# Patient Record
Sex: Male | Born: 1935 | Race: White | Hispanic: No | Marital: Married | Smoking: Never smoker
Health system: Southern US, Community
[De-identification: ages and names within clinical notes are randomized; demographics above are authoritative.]

## PROBLEM LIST (undated history)

## (undated) DIAGNOSIS — F329 Major depressive disorder, single episode, unspecified: Secondary | ICD-10-CM

## (undated) DIAGNOSIS — R51 Headache: Secondary | ICD-10-CM

## (undated) DIAGNOSIS — I69359 Hemiplegia and hemiparesis following cerebral infarction affecting unspecified side: Secondary | ICD-10-CM

## (undated) DIAGNOSIS — I69398 Other sequelae of cerebral infarction: Secondary | ICD-10-CM

## (undated) DIAGNOSIS — K589 Irritable bowel syndrome without diarrhea: Secondary | ICD-10-CM

## (undated) DIAGNOSIS — R519 Headache, unspecified: Secondary | ICD-10-CM

## (undated) DIAGNOSIS — I2510 Atherosclerotic heart disease of native coronary artery without angina pectoris: Secondary | ICD-10-CM

## (undated) DIAGNOSIS — F4310 Post-traumatic stress disorder, unspecified: Secondary | ICD-10-CM

## (undated) DIAGNOSIS — N433 Hydrocele, unspecified: Secondary | ICD-10-CM

## (undated) DIAGNOSIS — M545 Low back pain, unspecified: Secondary | ICD-10-CM

## (undated) DIAGNOSIS — N400 Enlarged prostate without lower urinary tract symptoms: Secondary | ICD-10-CM

## (undated) DIAGNOSIS — K449 Diaphragmatic hernia without obstruction or gangrene: Secondary | ICD-10-CM

## (undated) DIAGNOSIS — R350 Frequency of micturition: Secondary | ICD-10-CM

## (undated) DIAGNOSIS — R269 Unspecified abnormalities of gait and mobility: Secondary | ICD-10-CM

## (undated) DIAGNOSIS — F32A Depression, unspecified: Secondary | ICD-10-CM

## (undated) DIAGNOSIS — H9190 Unspecified hearing loss, unspecified ear: Secondary | ICD-10-CM

## (undated) DIAGNOSIS — I219 Acute myocardial infarction, unspecified: Secondary | ICD-10-CM

## (undated) DIAGNOSIS — R609 Edema, unspecified: Secondary | ICD-10-CM

## (undated) DIAGNOSIS — M109 Gout, unspecified: Secondary | ICD-10-CM

## (undated) DIAGNOSIS — S065X9A Traumatic subdural hemorrhage with loss of consciousness of unspecified duration, initial encounter: Secondary | ICD-10-CM

## (undated) DIAGNOSIS — H5440 Blindness, one eye, unspecified eye: Secondary | ICD-10-CM

## (undated) DIAGNOSIS — F419 Anxiety disorder, unspecified: Secondary | ICD-10-CM

## (undated) DIAGNOSIS — E785 Hyperlipidemia, unspecified: Secondary | ICD-10-CM

## (undated) DIAGNOSIS — I82419 Acute embolism and thrombosis of unspecified femoral vein: Secondary | ICD-10-CM

## (undated) DIAGNOSIS — K2970 Gastritis, unspecified, without bleeding: Secondary | ICD-10-CM

## (undated) DIAGNOSIS — I10 Essential (primary) hypertension: Secondary | ICD-10-CM

## (undated) DIAGNOSIS — G629 Polyneuropathy, unspecified: Secondary | ICD-10-CM

## (undated) DIAGNOSIS — G8929 Other chronic pain: Secondary | ICD-10-CM

## (undated) DIAGNOSIS — Z9289 Personal history of other medical treatment: Secondary | ICD-10-CM

## (undated) DIAGNOSIS — S065XAA Traumatic subdural hemorrhage with loss of consciousness status unknown, initial encounter: Secondary | ICD-10-CM

## (undated) DIAGNOSIS — L0390 Cellulitis, unspecified: Secondary | ICD-10-CM

## (undated) DIAGNOSIS — K219 Gastro-esophageal reflux disease without esophagitis: Secondary | ICD-10-CM

## (undated) DIAGNOSIS — G459 Transient cerebral ischemic attack, unspecified: Secondary | ICD-10-CM

## (undated) DIAGNOSIS — Z8619 Personal history of other infectious and parasitic diseases: Secondary | ICD-10-CM

## (undated) DIAGNOSIS — I739 Peripheral vascular disease, unspecified: Secondary | ICD-10-CM

## (undated) DIAGNOSIS — I639 Cerebral infarction, unspecified: Secondary | ICD-10-CM

## (undated) DIAGNOSIS — I2699 Other pulmonary embolism without acute cor pulmonale: Secondary | ICD-10-CM

## (undated) DIAGNOSIS — K222 Esophageal obstruction: Secondary | ICD-10-CM

## (undated) DIAGNOSIS — R42 Dizziness and giddiness: Secondary | ICD-10-CM

## (undated) DIAGNOSIS — R296 Repeated falls: Secondary | ICD-10-CM

## (undated) DIAGNOSIS — M1990 Unspecified osteoarthritis, unspecified site: Secondary | ICD-10-CM

## (undated) DIAGNOSIS — I499 Cardiac arrhythmia, unspecified: Secondary | ICD-10-CM

## (undated) HISTORY — DX: Low back pain, unspecified: M54.50

## (undated) HISTORY — DX: Irritable bowel syndrome, unspecified: K58.9

## (undated) HISTORY — DX: Acute embolism and thrombosis of unspecified femoral vein: I82.419

## (undated) HISTORY — DX: Cerebral infarction, unspecified: I63.9

## (undated) HISTORY — DX: Edema, unspecified: R60.9

## (undated) HISTORY — PX: CORONARY ANGIOPLASTY WITH STENT PLACEMENT: SHX49

## (undated) HISTORY — DX: Other pulmonary embolism without acute cor pulmonale: I26.99

## (undated) HISTORY — DX: Personal history of other infectious and parasitic diseases: Z86.19

## (undated) HISTORY — PX: ESOPHAGOGASTRODUODENOSCOPY (EGD) WITH ESOPHAGEAL DILATION: SHX5812

## (undated) HISTORY — DX: Polyneuropathy, unspecified: G62.9

## (undated) HISTORY — DX: Diaphragmatic hernia without obstruction or gangrene: K44.9

## (undated) HISTORY — DX: Traumatic subdural hemorrhage with loss of consciousness status unknown, initial encounter: S06.5XAA

## (undated) HISTORY — DX: Other chronic pain: G89.29

## (undated) HISTORY — PX: TONSILLECTOMY: SUR1361

## (undated) HISTORY — DX: Hyperlipidemia, unspecified: E78.5

## (undated) HISTORY — DX: Repeated falls: R29.6

## (undated) HISTORY — DX: Hydrocele, unspecified: N43.3

## (undated) HISTORY — PX: HAND CONTRACTURE RELEASE: SHX1724

## (undated) HISTORY — DX: Gastro-esophageal reflux disease without esophagitis: K21.9

## (undated) HISTORY — PX: CATARACT EXTRACTION, BILATERAL: SHX1313

## (undated) HISTORY — PX: CAROTID ENDARTERECTOMY: SUR193

## (undated) HISTORY — DX: Peripheral vascular disease, unspecified: I73.9

## (undated) HISTORY — DX: Gastritis, unspecified, without bleeding: K29.70

## (undated) HISTORY — DX: Traumatic subdural hemorrhage with loss of consciousness of unspecified duration, initial encounter: S06.5X9A

## (undated) HISTORY — DX: Low back pain: M54.5

## (undated) HISTORY — DX: Esophageal obstruction: K22.2

## (undated) HISTORY — DX: Benign prostatic hyperplasia without lower urinary tract symptoms: N40.0

## (undated) HISTORY — DX: Atherosclerotic heart disease of native coronary artery without angina pectoris: I25.10

## (undated) HISTORY — DX: Cellulitis, unspecified: L03.90

## (undated) HISTORY — PX: HAND SURGERY: SHX662

## (undated) HISTORY — DX: Unspecified abnormalities of gait and mobility: R26.9

## (undated) HISTORY — DX: Gout, unspecified: M10.9

## (undated) HISTORY — DX: Personal history of other medical treatment: Z92.89

## (undated) HISTORY — DX: Hemiplegia and hemiparesis following cerebral infarction affecting unspecified side: I69.359

## (undated) HISTORY — PX: ANGIOPLASTY: SHX39

## (undated) HISTORY — DX: Other sequelae of cerebral infarction: I69.398

## (undated) HISTORY — DX: Anxiety disorder, unspecified: F41.9

## (undated) HISTORY — DX: Dizziness and giddiness: R42

## (undated) HISTORY — DX: Unspecified hearing loss, unspecified ear: H91.90

---

## 1976-03-13 HISTORY — PX: TYMPANIC MEMBRANE REPAIR: SHX294

## 1998-06-20 ENCOUNTER — Encounter: Payer: Self-pay | Admitting: *Deleted

## 1998-06-20 ENCOUNTER — Inpatient Hospital Stay (HOSPITAL_COMMUNITY): Admission: EM | Admit: 1998-06-20 | Discharge: 1998-06-21 | Payer: Self-pay | Admitting: *Deleted

## 1998-06-21 ENCOUNTER — Encounter: Payer: Self-pay | Admitting: Cardiology

## 1998-06-24 ENCOUNTER — Ambulatory Visit (HOSPITAL_COMMUNITY): Admission: RE | Admit: 1998-06-24 | Discharge: 1998-06-24 | Payer: Self-pay | Admitting: Gastroenterology

## 1998-06-24 ENCOUNTER — Encounter: Payer: Self-pay | Admitting: Gastroenterology

## 1998-07-28 ENCOUNTER — Ambulatory Visit (HOSPITAL_COMMUNITY): Admission: RE | Admit: 1998-07-28 | Discharge: 1998-07-28 | Payer: Self-pay | Admitting: Gastroenterology

## 1998-09-09 ENCOUNTER — Emergency Department (HOSPITAL_COMMUNITY): Admission: EM | Admit: 1998-09-09 | Discharge: 1998-09-09 | Payer: Self-pay | Admitting: Emergency Medicine

## 1998-11-19 ENCOUNTER — Inpatient Hospital Stay (HOSPITAL_COMMUNITY): Admission: EM | Admit: 1998-11-19 | Discharge: 1998-11-21 | Payer: Self-pay | Admitting: *Deleted

## 1998-11-20 ENCOUNTER — Encounter: Payer: Self-pay | Admitting: Internal Medicine

## 1998-12-14 ENCOUNTER — Encounter: Payer: Self-pay | Admitting: Internal Medicine

## 1998-12-14 ENCOUNTER — Ambulatory Visit (HOSPITAL_COMMUNITY): Admission: RE | Admit: 1998-12-14 | Discharge: 1998-12-14 | Payer: Self-pay | Admitting: Gastroenterology

## 2001-12-21 ENCOUNTER — Emergency Department (HOSPITAL_COMMUNITY): Admission: EM | Admit: 2001-12-21 | Discharge: 2001-12-21 | Payer: Self-pay | Admitting: Emergency Medicine

## 2001-12-21 ENCOUNTER — Encounter: Payer: Self-pay | Admitting: Family Medicine

## 2002-09-08 ENCOUNTER — Inpatient Hospital Stay (HOSPITAL_COMMUNITY): Admission: AD | Admit: 2002-09-08 | Discharge: 2002-09-09 | Payer: Self-pay | Admitting: Internal Medicine

## 2002-09-08 ENCOUNTER — Encounter: Payer: Self-pay | Admitting: Internal Medicine

## 2002-10-28 ENCOUNTER — Emergency Department (HOSPITAL_COMMUNITY): Admission: EM | Admit: 2002-10-28 | Discharge: 2002-10-29 | Payer: Self-pay | Admitting: Emergency Medicine

## 2002-10-28 ENCOUNTER — Encounter: Payer: Self-pay | Admitting: Emergency Medicine

## 2002-12-10 ENCOUNTER — Ambulatory Visit (HOSPITAL_COMMUNITY): Admission: RE | Admit: 2002-12-10 | Discharge: 2002-12-10 | Payer: Self-pay | Admitting: Internal Medicine

## 2002-12-10 ENCOUNTER — Encounter (INDEPENDENT_AMBULATORY_CARE_PROVIDER_SITE_OTHER): Payer: Self-pay | Admitting: Specialist

## 2004-03-25 ENCOUNTER — Ambulatory Visit: Payer: Self-pay | Admitting: Internal Medicine

## 2004-03-26 ENCOUNTER — Emergency Department (HOSPITAL_COMMUNITY): Admission: EM | Admit: 2004-03-26 | Discharge: 2004-03-26 | Payer: Self-pay | Admitting: Emergency Medicine

## 2004-04-22 ENCOUNTER — Ambulatory Visit: Payer: Self-pay | Admitting: Internal Medicine

## 2004-05-16 ENCOUNTER — Ambulatory Visit: Payer: Self-pay | Admitting: Internal Medicine

## 2004-05-24 ENCOUNTER — Ambulatory Visit: Payer: Self-pay | Admitting: Internal Medicine

## 2004-06-05 ENCOUNTER — Ambulatory Visit: Payer: Self-pay | Admitting: Internal Medicine

## 2004-06-05 ENCOUNTER — Inpatient Hospital Stay (HOSPITAL_COMMUNITY): Admission: EM | Admit: 2004-06-05 | Discharge: 2004-06-07 | Payer: Self-pay | Admitting: Emergency Medicine

## 2004-06-06 ENCOUNTER — Ambulatory Visit: Payer: Self-pay | Admitting: Internal Medicine

## 2004-06-06 ENCOUNTER — Encounter: Payer: Self-pay | Admitting: Internal Medicine

## 2004-06-21 ENCOUNTER — Ambulatory Visit: Payer: Self-pay | Admitting: Internal Medicine

## 2004-07-11 ENCOUNTER — Ambulatory Visit: Payer: Self-pay | Admitting: Internal Medicine

## 2004-08-13 ENCOUNTER — Ambulatory Visit: Payer: Self-pay | Admitting: Family Medicine

## 2004-08-19 ENCOUNTER — Ambulatory Visit: Payer: Self-pay | Admitting: Internal Medicine

## 2004-12-19 ENCOUNTER — Encounter: Admission: RE | Admit: 2004-12-19 | Discharge: 2005-03-19 | Payer: Self-pay | Admitting: Neurology

## 2005-01-02 ENCOUNTER — Encounter: Admission: RE | Admit: 2005-01-02 | Discharge: 2005-01-02 | Payer: Self-pay | Admitting: Neurology

## 2005-01-16 ENCOUNTER — Ambulatory Visit: Payer: Self-pay

## 2005-03-30 ENCOUNTER — Emergency Department (HOSPITAL_COMMUNITY): Admission: EM | Admit: 2005-03-30 | Discharge: 2005-03-30 | Payer: Self-pay | Admitting: Emergency Medicine

## 2005-06-23 ENCOUNTER — Ambulatory Visit: Payer: Self-pay | Admitting: Endocrinology

## 2005-07-14 ENCOUNTER — Encounter (INDEPENDENT_AMBULATORY_CARE_PROVIDER_SITE_OTHER): Payer: Self-pay | Admitting: *Deleted

## 2005-07-14 ENCOUNTER — Ambulatory Visit: Payer: Self-pay | Admitting: Internal Medicine

## 2005-07-14 ENCOUNTER — Inpatient Hospital Stay (HOSPITAL_COMMUNITY): Admission: AD | Admit: 2005-07-14 | Discharge: 2005-07-24 | Payer: Self-pay | Admitting: Internal Medicine

## 2005-07-21 ENCOUNTER — Ambulatory Visit: Payer: Self-pay | Admitting: Physical Medicine & Rehabilitation

## 2005-07-27 ENCOUNTER — Ambulatory Visit: Payer: Self-pay | Admitting: Internal Medicine

## 2005-07-31 ENCOUNTER — Ambulatory Visit: Payer: Self-pay | Admitting: Internal Medicine

## 2005-08-05 ENCOUNTER — Emergency Department (HOSPITAL_COMMUNITY): Admission: EM | Admit: 2005-08-05 | Discharge: 2005-08-05 | Payer: Self-pay | Admitting: Emergency Medicine

## 2005-08-08 ENCOUNTER — Ambulatory Visit: Payer: Self-pay | Admitting: Internal Medicine

## 2005-08-14 ENCOUNTER — Ambulatory Visit: Payer: Self-pay | Admitting: Cardiology

## 2005-08-24 ENCOUNTER — Ambulatory Visit: Payer: Self-pay | Admitting: Cardiology

## 2005-09-04 ENCOUNTER — Ambulatory Visit: Payer: Self-pay | Admitting: Cardiology

## 2005-09-18 ENCOUNTER — Ambulatory Visit: Payer: Self-pay | Admitting: Internal Medicine

## 2005-10-09 ENCOUNTER — Ambulatory Visit: Payer: Self-pay | Admitting: Cardiovascular Disease

## 2005-10-25 ENCOUNTER — Ambulatory Visit: Payer: Self-pay | Admitting: *Deleted

## 2005-10-30 ENCOUNTER — Ambulatory Visit: Payer: Self-pay | Admitting: Internal Medicine

## 2005-11-22 ENCOUNTER — Ambulatory Visit: Payer: Self-pay | Admitting: Cardiovascular Disease

## 2005-12-18 ENCOUNTER — Ambulatory Visit: Payer: Self-pay | Admitting: Endocrinology

## 2005-12-20 ENCOUNTER — Ambulatory Visit: Payer: Self-pay | Admitting: Cardiology

## 2005-12-28 ENCOUNTER — Ambulatory Visit: Payer: Self-pay | Admitting: Internal Medicine

## 2006-01-01 ENCOUNTER — Ambulatory Visit: Payer: Self-pay | Admitting: Internal Medicine

## 2006-01-01 LAB — CONVERTED CEMR LAB
BUN: 15 mg/dL (ref 6–23)
Basophils Absolute: 0.1 10*3/uL (ref 0.0–0.1)
CO2: 29 meq/L (ref 19–32)
Chloride: 111 meq/L (ref 96–112)
HCT: 44.6 % (ref 39.0–52.0)
MCHC: 33.4 g/dL (ref 30.0–36.0)
MCV: 101.2 fL — ABNORMAL HIGH (ref 78.0–100.0)
Monocytes Relative: 9.2 % (ref 3.0–11.0)
Platelets: 200 10*3/uL (ref 150–400)
Potassium: 5.1 meq/L (ref 3.5–5.1)
RBC: 4.4 M/uL (ref 4.22–5.81)
RDW: 12.9 % (ref 11.5–14.6)
TSH: 2.14 microintl units/mL (ref 0.35–5.50)

## 2006-01-03 ENCOUNTER — Ambulatory Visit: Payer: Self-pay | Admitting: Internal Medicine

## 2006-01-04 ENCOUNTER — Ambulatory Visit: Payer: Self-pay | Admitting: Internal Medicine

## 2006-01-04 ENCOUNTER — Observation Stay (HOSPITAL_COMMUNITY): Admission: EM | Admit: 2006-01-04 | Discharge: 2006-01-04 | Payer: Self-pay | Admitting: Emergency Medicine

## 2006-01-05 ENCOUNTER — Ambulatory Visit: Payer: Self-pay | Admitting: Internal Medicine

## 2006-01-07 ENCOUNTER — Inpatient Hospital Stay (HOSPITAL_COMMUNITY): Admission: EM | Admit: 2006-01-07 | Discharge: 2006-01-10 | Payer: Self-pay | Admitting: Emergency Medicine

## 2006-01-07 ENCOUNTER — Ambulatory Visit: Payer: Self-pay | Admitting: Cardiology

## 2006-01-15 ENCOUNTER — Ambulatory Visit: Payer: Self-pay | Admitting: Cardiology

## 2006-01-17 ENCOUNTER — Ambulatory Visit: Payer: Self-pay | Admitting: Cardiology

## 2006-01-22 ENCOUNTER — Ambulatory Visit: Payer: Self-pay | Admitting: Cardiovascular Disease

## 2006-01-23 ENCOUNTER — Ambulatory Visit: Payer: Self-pay | Admitting: Internal Medicine

## 2006-01-26 ENCOUNTER — Ambulatory Visit: Payer: Self-pay | Admitting: Cardiovascular Disease

## 2006-01-28 ENCOUNTER — Inpatient Hospital Stay (HOSPITAL_COMMUNITY): Admission: EM | Admit: 2006-01-28 | Discharge: 2006-01-29 | Payer: Self-pay | Admitting: Emergency Medicine

## 2006-01-30 ENCOUNTER — Ambulatory Visit: Payer: Self-pay | Admitting: Internal Medicine

## 2006-02-16 ENCOUNTER — Ambulatory Visit: Payer: Self-pay | Admitting: Cardiology

## 2006-02-19 ENCOUNTER — Ambulatory Visit: Payer: Self-pay

## 2006-02-21 ENCOUNTER — Ambulatory Visit: Payer: Self-pay | Admitting: Internal Medicine

## 2006-02-21 ENCOUNTER — Ambulatory Visit: Payer: Self-pay | Admitting: Cardiovascular Disease

## 2006-02-22 ENCOUNTER — Ambulatory Visit: Payer: Self-pay | Admitting: Internal Medicine

## 2006-02-22 ENCOUNTER — Encounter (INDEPENDENT_AMBULATORY_CARE_PROVIDER_SITE_OTHER): Payer: Self-pay | Admitting: *Deleted

## 2006-02-22 DIAGNOSIS — K2990 Gastroduodenitis, unspecified, without bleeding: Secondary | ICD-10-CM

## 2006-02-22 DIAGNOSIS — K222 Esophageal obstruction: Secondary | ICD-10-CM | POA: Insufficient documentation

## 2006-02-22 DIAGNOSIS — K2980 Duodenitis without bleeding: Secondary | ICD-10-CM | POA: Insufficient documentation

## 2006-02-22 DIAGNOSIS — K2970 Gastritis, unspecified, without bleeding: Secondary | ICD-10-CM | POA: Insufficient documentation

## 2006-02-26 ENCOUNTER — Ambulatory Visit: Payer: Self-pay | Admitting: Gastroenterology

## 2006-02-26 ENCOUNTER — Ambulatory Visit: Payer: Self-pay | Admitting: Cardiovascular Disease

## 2006-02-26 LAB — CONVERTED CEMR LAB
ALT: 25 units/L (ref 0–40)
Alkaline Phosphatase: 65 units/L (ref 39–117)
Bilirubin, Direct: 0.2 mg/dL (ref 0.0–0.3)
Chol/HDL Ratio, serum: 5.6
Total Protein: 6.8 g/dL (ref 6.0–8.3)
Triglyceride fasting, serum: 118 mg/dL (ref 0–149)
VLDL: 24 mg/dL (ref 0–40)

## 2006-03-16 ENCOUNTER — Ambulatory Visit: Payer: Self-pay | Admitting: Cardiology

## 2006-04-03 ENCOUNTER — Inpatient Hospital Stay (HOSPITAL_COMMUNITY): Admission: EM | Admit: 2006-04-03 | Discharge: 2006-04-05 | Payer: Self-pay | Admitting: Emergency Medicine

## 2006-04-03 ENCOUNTER — Ambulatory Visit: Payer: Self-pay | Admitting: Internal Medicine

## 2006-04-09 ENCOUNTER — Ambulatory Visit: Payer: Self-pay | Admitting: Internal Medicine

## 2006-04-13 ENCOUNTER — Ambulatory Visit: Payer: Self-pay | Admitting: Internal Medicine

## 2006-04-18 ENCOUNTER — Ambulatory Visit: Payer: Self-pay | Admitting: Internal Medicine

## 2006-04-21 ENCOUNTER — Emergency Department (HOSPITAL_COMMUNITY): Admission: EM | Admit: 2006-04-21 | Discharge: 2006-04-22 | Payer: Self-pay | Admitting: Emergency Medicine

## 2006-04-25 ENCOUNTER — Ambulatory Visit: Payer: Self-pay | Admitting: Internal Medicine

## 2006-05-11 ENCOUNTER — Ambulatory Visit: Payer: Self-pay | Admitting: Internal Medicine

## 2006-06-05 ENCOUNTER — Emergency Department (HOSPITAL_COMMUNITY): Admission: EM | Admit: 2006-06-05 | Discharge: 2006-06-06 | Payer: Self-pay | Admitting: Emergency Medicine

## 2006-06-14 ENCOUNTER — Ambulatory Visit: Payer: Self-pay | Admitting: Cardiology

## 2006-06-25 ENCOUNTER — Ambulatory Visit: Payer: Self-pay | Admitting: Internal Medicine

## 2006-06-28 ENCOUNTER — Ambulatory Visit: Payer: Self-pay | Admitting: Internal Medicine

## 2006-07-12 ENCOUNTER — Ambulatory Visit: Payer: Self-pay | Admitting: Cardiovascular Disease

## 2006-08-02 ENCOUNTER — Ambulatory Visit: Payer: Self-pay | Admitting: Cardiology

## 2006-08-16 ENCOUNTER — Ambulatory Visit: Payer: Self-pay | Admitting: Cardiovascular Disease

## 2006-08-21 ENCOUNTER — Ambulatory Visit: Payer: Self-pay | Admitting: Internal Medicine

## 2006-08-31 ENCOUNTER — Ambulatory Visit: Payer: Self-pay | Admitting: Cardiovascular Disease

## 2006-09-21 ENCOUNTER — Ambulatory Visit: Payer: Self-pay | Admitting: Cardiology

## 2006-10-03 DIAGNOSIS — Z8679 Personal history of other diseases of the circulatory system: Secondary | ICD-10-CM | POA: Insufficient documentation

## 2006-10-03 DIAGNOSIS — E785 Hyperlipidemia, unspecified: Secondary | ICD-10-CM

## 2006-10-19 ENCOUNTER — Ambulatory Visit: Payer: Self-pay | Admitting: Cardiology

## 2006-10-22 ENCOUNTER — Encounter
Admission: RE | Admit: 2006-10-22 | Discharge: 2006-10-22 | Payer: Self-pay | Admitting: Physical Medicine and Rehabilitation

## 2006-11-06 ENCOUNTER — Ambulatory Visit: Payer: Self-pay | Admitting: Cardiology

## 2006-11-14 ENCOUNTER — Ambulatory Visit: Payer: Self-pay | Admitting: Internal Medicine

## 2006-11-23 ENCOUNTER — Emergency Department (HOSPITAL_COMMUNITY): Admission: EM | Admit: 2006-11-23 | Discharge: 2006-11-24 | Payer: Self-pay | Admitting: Emergency Medicine

## 2006-11-27 ENCOUNTER — Encounter: Admission: RE | Admit: 2006-11-27 | Discharge: 2006-12-11 | Payer: Self-pay | Admitting: Neurology

## 2006-11-28 ENCOUNTER — Ambulatory Visit: Payer: Self-pay | Admitting: Internal Medicine

## 2006-12-19 ENCOUNTER — Ambulatory Visit: Payer: Self-pay | Admitting: Internal Medicine

## 2006-12-26 ENCOUNTER — Ambulatory Visit: Payer: Self-pay | Admitting: Cardiovascular Disease

## 2007-01-03 ENCOUNTER — Ambulatory Visit: Payer: Self-pay | Admitting: Cardiovascular Disease

## 2007-01-09 ENCOUNTER — Ambulatory Visit: Payer: Self-pay | Admitting: Cardiology

## 2007-01-18 ENCOUNTER — Ambulatory Visit: Payer: Self-pay | Admitting: Cardiovascular Disease

## 2007-01-18 ENCOUNTER — Ambulatory Visit: Payer: Self-pay | Admitting: Internal Medicine

## 2007-01-18 DIAGNOSIS — Z86718 Personal history of other venous thrombosis and embolism: Secondary | ICD-10-CM | POA: Insufficient documentation

## 2007-01-18 DIAGNOSIS — K219 Gastro-esophageal reflux disease without esophagitis: Secondary | ICD-10-CM | POA: Insufficient documentation

## 2007-01-18 DIAGNOSIS — I739 Peripheral vascular disease, unspecified: Secondary | ICD-10-CM | POA: Insufficient documentation

## 2007-01-18 LAB — CONVERTED CEMR LAB
AST: 21 units/L (ref 0–37)
Albumin: 3.4 g/dL — ABNORMAL LOW (ref 3.5–5.2)
Bilirubin, Direct: 0.2 mg/dL (ref 0.0–0.3)
Cholesterol: 211 mg/dL (ref 0–200)
Direct LDL: 155.4 mg/dL
HDL: 37 mg/dL — ABNORMAL LOW (ref >39.0)
Total Bilirubin: 1.1 mg/dL (ref 0.3–1.2)
Triglycerides: 81 mg/dL (ref 0–149)
VLDL: 16 mg/dL (ref 0–40)

## 2007-01-24 ENCOUNTER — Ambulatory Visit: Payer: Self-pay | Admitting: Cardiology

## 2007-02-14 ENCOUNTER — Ambulatory Visit: Payer: Self-pay | Admitting: Cardiovascular Disease

## 2007-03-12 ENCOUNTER — Ambulatory Visit: Payer: Self-pay | Admitting: Internal Medicine

## 2007-03-14 ENCOUNTER — Ambulatory Visit: Payer: Self-pay | Admitting: Internal Medicine

## 2007-03-14 DIAGNOSIS — I82419 Acute embolism and thrombosis of unspecified femoral vein: Secondary | ICD-10-CM

## 2007-03-14 HISTORY — DX: Acute embolism and thrombosis of unspecified femoral vein: I82.419

## 2007-03-15 ENCOUNTER — Inpatient Hospital Stay (HOSPITAL_COMMUNITY): Admission: EM | Admit: 2007-03-15 | Discharge: 2007-03-17 | Payer: Self-pay | Admitting: Emergency Medicine

## 2007-03-16 ENCOUNTER — Ambulatory Visit: Payer: Self-pay | Admitting: Surgery

## 2007-03-16 ENCOUNTER — Encounter: Payer: Self-pay | Admitting: Internal Medicine

## 2007-03-21 ENCOUNTER — Telehealth: Payer: Self-pay | Admitting: Internal Medicine

## 2007-03-25 ENCOUNTER — Ambulatory Visit: Payer: Self-pay | Admitting: Internal Medicine

## 2007-03-25 DIAGNOSIS — L03119 Cellulitis of unspecified part of limb: Secondary | ICD-10-CM

## 2007-03-25 DIAGNOSIS — R609 Edema, unspecified: Secondary | ICD-10-CM

## 2007-03-25 DIAGNOSIS — L02419 Cutaneous abscess of limb, unspecified: Secondary | ICD-10-CM | POA: Insufficient documentation

## 2007-04-04 ENCOUNTER — Ambulatory Visit: Payer: Self-pay | Admitting: Cardiology

## 2007-04-14 ENCOUNTER — Encounter: Payer: Self-pay | Admitting: Internal Medicine

## 2007-04-30 ENCOUNTER — Ambulatory Visit: Payer: Self-pay | Admitting: Cardiology

## 2007-05-17 ENCOUNTER — Ambulatory Visit: Payer: Self-pay | Admitting: Cardiology

## 2007-05-27 DIAGNOSIS — F419 Anxiety disorder, unspecified: Secondary | ICD-10-CM | POA: Insufficient documentation

## 2007-05-28 ENCOUNTER — Ambulatory Visit: Payer: Self-pay | Admitting: Internal Medicine

## 2007-05-28 LAB — CONVERTED CEMR LAB
ALT: 17 units/L (ref 0–53)
AST: 20 units/L (ref 0–37)
Alkaline Phosphatase: 59 units/L (ref 39–117)
BUN: 12 mg/dL (ref 6–23)
Basophils Relative: 1.4 % — ABNORMAL HIGH (ref 0.0–1.0)
Bilirubin, Direct: 0.1 mg/dL (ref 0.0–0.3)
CO2: 29 meq/L (ref 19–32)
Calcium: 8.8 mg/dL (ref 8.4–10.5)
Chloride: 106 meq/L (ref 96–112)
Creatinine, Ser: 1.3 mg/dL (ref 0.4–1.5)
Eosinophils Relative: 2 % (ref 0.0–5.0)
Glucose, Bld: 90 mg/dL (ref 70–99)
Lymphocytes Relative: 45.3 % (ref 12.0–46.0)
Monocytes Relative: 9 % (ref 3.0–11.0)
Platelets: 184 10*3/uL (ref 150–400)
RDW: 13.4 % (ref 11.5–14.6)
Total Bilirubin: 0.8 mg/dL (ref 0.3–1.2)
Total Protein: 6.6 g/dL (ref 6.0–8.3)
WBC: 4.8 10*3/uL (ref 4.5–10.5)

## 2007-05-30 ENCOUNTER — Ambulatory Visit (HOSPITAL_COMMUNITY): Admission: RE | Admit: 2007-05-30 | Discharge: 2007-05-30 | Payer: Self-pay | Admitting: Internal Medicine

## 2007-05-31 ENCOUNTER — Ambulatory Visit: Payer: Self-pay | Admitting: Cardiovascular Disease

## 2007-06-24 ENCOUNTER — Ambulatory Visit: Payer: Self-pay | Admitting: Cardiology

## 2007-06-25 ENCOUNTER — Ambulatory Visit: Payer: Self-pay | Admitting: Internal Medicine

## 2007-06-25 DIAGNOSIS — R42 Dizziness and giddiness: Secondary | ICD-10-CM

## 2007-07-25 ENCOUNTER — Ambulatory Visit: Payer: Self-pay | Admitting: Internal Medicine

## 2007-08-02 ENCOUNTER — Telehealth: Payer: Self-pay | Admitting: Internal Medicine

## 2007-08-23 ENCOUNTER — Ambulatory Visit: Payer: Self-pay | Admitting: Cardiology

## 2007-09-06 ENCOUNTER — Ambulatory Visit: Payer: Self-pay | Admitting: Cardiology

## 2007-10-04 ENCOUNTER — Ambulatory Visit: Payer: Self-pay | Admitting: Cardiology

## 2007-10-07 ENCOUNTER — Telehealth: Payer: Self-pay | Admitting: Internal Medicine

## 2007-10-15 ENCOUNTER — Ambulatory Visit: Payer: Self-pay | Admitting: Internal Medicine

## 2007-10-15 DIAGNOSIS — K589 Irritable bowel syndrome without diarrhea: Secondary | ICD-10-CM | POA: Insufficient documentation

## 2007-11-01 ENCOUNTER — Ambulatory Visit: Payer: Self-pay | Admitting: Internal Medicine

## 2007-11-11 ENCOUNTER — Ambulatory Visit: Payer: Self-pay | Admitting: Cardiology

## 2007-12-02 ENCOUNTER — Ambulatory Visit: Payer: Self-pay | Admitting: Cardiovascular Disease

## 2007-12-16 ENCOUNTER — Ambulatory Visit: Payer: Self-pay | Admitting: Cardiology

## 2007-12-19 ENCOUNTER — Encounter (INDEPENDENT_AMBULATORY_CARE_PROVIDER_SITE_OTHER): Payer: Self-pay | Admitting: Emergency Medicine

## 2007-12-19 ENCOUNTER — Emergency Department (HOSPITAL_COMMUNITY): Admission: EM | Admit: 2007-12-19 | Discharge: 2007-12-19 | Payer: Self-pay | Admitting: Emergency Medicine

## 2007-12-19 ENCOUNTER — Ambulatory Visit: Payer: Self-pay | Admitting: Vascular Surgery

## 2008-01-06 ENCOUNTER — Ambulatory Visit: Payer: Self-pay | Admitting: Cardiology

## 2008-01-14 ENCOUNTER — Ambulatory Visit: Payer: Self-pay | Admitting: Cardiovascular Disease

## 2008-01-14 ENCOUNTER — Ambulatory Visit: Payer: Self-pay | Admitting: Internal Medicine

## 2008-01-30 ENCOUNTER — Ambulatory Visit: Payer: Self-pay | Admitting: Cardiology

## 2008-02-23 ENCOUNTER — Emergency Department (HOSPITAL_COMMUNITY): Admission: EM | Admit: 2008-02-23 | Discharge: 2008-02-23 | Payer: Self-pay | Admitting: Emergency Medicine

## 2008-02-25 ENCOUNTER — Encounter: Payer: Self-pay | Admitting: Internal Medicine

## 2008-02-27 ENCOUNTER — Ambulatory Visit: Payer: Self-pay | Admitting: Cardiology

## 2008-03-19 ENCOUNTER — Ambulatory Visit: Payer: Self-pay | Admitting: Cardiovascular Disease

## 2008-04-09 ENCOUNTER — Ambulatory Visit: Payer: Self-pay | Admitting: Internal Medicine

## 2008-04-09 ENCOUNTER — Telehealth: Payer: Self-pay | Admitting: Internal Medicine

## 2008-04-23 ENCOUNTER — Ambulatory Visit: Payer: Self-pay | Admitting: Cardiovascular Disease

## 2008-04-24 ENCOUNTER — Encounter: Payer: Self-pay | Admitting: Internal Medicine

## 2008-05-08 ENCOUNTER — Encounter: Payer: Self-pay | Admitting: Internal Medicine

## 2008-05-11 ENCOUNTER — Ambulatory Visit: Payer: Self-pay | Admitting: Cardiology

## 2008-05-25 ENCOUNTER — Ambulatory Visit: Payer: Self-pay | Admitting: Cardiology

## 2008-06-22 ENCOUNTER — Ambulatory Visit: Payer: Self-pay | Admitting: Internal Medicine

## 2008-06-24 ENCOUNTER — Telehealth: Payer: Self-pay | Admitting: Cardiovascular Disease

## 2008-07-03 ENCOUNTER — Telehealth: Payer: Self-pay | Admitting: Cardiovascular Disease

## 2008-07-20 ENCOUNTER — Ambulatory Visit: Payer: Self-pay | Admitting: Cardiovascular Disease

## 2008-08-11 ENCOUNTER — Encounter: Payer: Self-pay | Admitting: *Deleted

## 2008-08-17 ENCOUNTER — Ambulatory Visit: Payer: Self-pay | Admitting: Cardiovascular Disease

## 2008-08-17 LAB — CONVERTED CEMR LAB: Protime: 20.2

## 2008-09-15 ENCOUNTER — Ambulatory Visit: Payer: Self-pay | Admitting: Cardiology

## 2008-09-15 LAB — CONVERTED CEMR LAB: Prothrombin Time: 17 s

## 2008-09-16 ENCOUNTER — Encounter: Payer: Self-pay | Admitting: *Deleted

## 2008-10-13 ENCOUNTER — Ambulatory Visit: Payer: Self-pay | Admitting: Cardiology

## 2008-10-13 LAB — CONVERTED CEMR LAB
POC INR: 4
Prothrombin Time: 24.4 s

## 2008-10-27 ENCOUNTER — Ambulatory Visit: Payer: Self-pay | Admitting: Cardiology

## 2008-11-05 ENCOUNTER — Encounter: Payer: Self-pay | Admitting: Internal Medicine

## 2008-11-17 ENCOUNTER — Ambulatory Visit: Payer: Self-pay | Admitting: Cardiology

## 2008-11-17 LAB — CONVERTED CEMR LAB: POC INR: 2.7

## 2008-11-26 ENCOUNTER — Emergency Department (HOSPITAL_COMMUNITY): Admission: EM | Admit: 2008-11-26 | Discharge: 2008-11-26 | Payer: Self-pay | Admitting: Emergency Medicine

## 2008-11-27 ENCOUNTER — Encounter: Payer: Self-pay | Admitting: Internal Medicine

## 2008-12-16 ENCOUNTER — Ambulatory Visit: Payer: Self-pay | Admitting: Internal Medicine

## 2009-01-04 ENCOUNTER — Ambulatory Visit: Payer: Self-pay | Admitting: Cardiovascular Disease

## 2009-01-21 ENCOUNTER — Ambulatory Visit: Payer: Self-pay | Admitting: Internal Medicine

## 2009-02-08 DIAGNOSIS — Z87898 Personal history of other specified conditions: Secondary | ICD-10-CM

## 2009-02-08 DIAGNOSIS — I82409 Acute embolism and thrombosis of unspecified deep veins of unspecified lower extremity: Secondary | ICD-10-CM | POA: Insufficient documentation

## 2009-02-08 DIAGNOSIS — Z8719 Personal history of other diseases of the digestive system: Secondary | ICD-10-CM

## 2009-02-09 ENCOUNTER — Ambulatory Visit: Payer: Self-pay | Admitting: Cardiology

## 2009-02-09 ENCOUNTER — Ambulatory Visit: Payer: Self-pay | Admitting: Cardiovascular Disease

## 2009-02-09 LAB — CONVERTED CEMR LAB: POC INR: 3.4

## 2009-02-15 ENCOUNTER — Emergency Department (HOSPITAL_COMMUNITY): Admission: EM | Admit: 2009-02-15 | Discharge: 2009-02-16 | Payer: Self-pay | Admitting: Emergency Medicine

## 2009-02-18 ENCOUNTER — Encounter: Payer: Self-pay | Admitting: Cardiovascular Disease

## 2009-02-18 ENCOUNTER — Ambulatory Visit: Payer: Self-pay

## 2009-02-23 ENCOUNTER — Telehealth: Payer: Self-pay | Admitting: Cardiovascular Disease

## 2009-02-25 ENCOUNTER — Emergency Department (HOSPITAL_COMMUNITY): Admission: EM | Admit: 2009-02-25 | Discharge: 2009-02-26 | Payer: Self-pay | Admitting: Emergency Medicine

## 2009-03-11 ENCOUNTER — Encounter: Payer: Self-pay | Admitting: Cardiovascular Disease

## 2009-03-11 ENCOUNTER — Ambulatory Visit: Payer: Self-pay

## 2009-03-16 ENCOUNTER — Encounter (INDEPENDENT_AMBULATORY_CARE_PROVIDER_SITE_OTHER): Payer: Self-pay | Admitting: Cardiology

## 2009-03-31 ENCOUNTER — Inpatient Hospital Stay (HOSPITAL_COMMUNITY): Admission: EM | Admit: 2009-03-31 | Discharge: 2009-04-01 | Payer: Self-pay | Admitting: Emergency Medicine

## 2009-04-02 ENCOUNTER — Encounter: Payer: Self-pay | Admitting: Cardiology

## 2009-04-28 ENCOUNTER — Ambulatory Visit (HOSPITAL_COMMUNITY): Admission: RE | Admit: 2009-04-28 | Discharge: 2009-04-28 | Payer: Self-pay | Admitting: Family Medicine

## 2009-04-28 ENCOUNTER — Encounter: Payer: Self-pay | Admitting: Internal Medicine

## 2009-07-22 ENCOUNTER — Encounter: Payer: Self-pay | Admitting: Cardiovascular Disease

## 2009-07-22 ENCOUNTER — Telehealth: Payer: Self-pay | Admitting: Cardiovascular Disease

## 2009-09-06 ENCOUNTER — Encounter: Payer: Self-pay | Admitting: Cardiovascular Disease

## 2009-09-07 ENCOUNTER — Ambulatory Visit: Payer: Self-pay | Admitting: Cardiovascular Disease

## 2009-09-17 DIAGNOSIS — I10 Essential (primary) hypertension: Secondary | ICD-10-CM

## 2009-11-04 ENCOUNTER — Emergency Department (HOSPITAL_COMMUNITY): Admission: RE | Admit: 2009-11-04 | Discharge: 2009-11-05 | Payer: Self-pay | Admitting: *Deleted

## 2009-11-23 ENCOUNTER — Telehealth: Payer: Self-pay | Admitting: Internal Medicine

## 2009-11-23 ENCOUNTER — Encounter: Admission: RE | Admit: 2009-11-23 | Discharge: 2009-11-23 | Payer: Self-pay | Admitting: Neurosurgery

## 2009-12-17 ENCOUNTER — Ambulatory Visit: Payer: Self-pay | Admitting: Internal Medicine

## 2010-01-01 ENCOUNTER — Emergency Department (HOSPITAL_COMMUNITY): Admission: EM | Admit: 2010-01-01 | Discharge: 2010-01-01 | Payer: Self-pay | Admitting: Emergency Medicine

## 2010-01-03 ENCOUNTER — Encounter: Admission: RE | Admit: 2010-01-03 | Discharge: 2010-01-03 | Payer: Self-pay | Admitting: Neurosurgery

## 2010-01-20 ENCOUNTER — Emergency Department (HOSPITAL_COMMUNITY): Admission: EM | Admit: 2010-01-20 | Discharge: 2010-01-21 | Payer: Self-pay | Admitting: Emergency Medicine

## 2010-01-26 ENCOUNTER — Encounter: Payer: Self-pay | Admitting: Internal Medicine

## 2010-02-15 ENCOUNTER — Encounter: Payer: Self-pay | Admitting: Internal Medicine

## 2010-02-28 ENCOUNTER — Encounter: Payer: Self-pay | Admitting: Internal Medicine

## 2010-04-05 ENCOUNTER — Encounter: Payer: Self-pay | Admitting: Cardiovascular Disease

## 2010-04-05 ENCOUNTER — Ambulatory Visit
Admission: RE | Admit: 2010-04-05 | Discharge: 2010-04-05 | Payer: Self-pay | Source: Home / Self Care | Attending: Cardiovascular Disease | Admitting: Cardiovascular Disease

## 2010-04-10 LAB — CONVERTED CEMR LAB
Basophils Relative: 0.3 % (ref 0.0–3.0)
CO2: 31 meq/L (ref 19–32)
Chloride: 107 meq/L (ref 96–112)
Creatinine, Ser: 1.4 mg/dL (ref 0.4–1.5)
Eosinophils Absolute: 0.4 10*3/uL (ref 0.0–0.7)
Glucose, Bld: 72 mg/dL (ref 70–99)
Hemoglobin: 15.5 g/dL (ref 13.0–17.0)
MCHC: 34.1 g/dL (ref 30.0–36.0)
MCV: 103.8 fL — ABNORMAL HIGH (ref 78.0–100.0)
Monocytes Absolute: 0.5 10*3/uL (ref 0.1–1.0)
Neutro Abs: 3.3 10*3/uL (ref 1.4–7.7)
Neutrophils Relative %: 48.9 % (ref 43.0–77.0)
RBC: 4.39 M/uL (ref 4.22–5.81)
RDW: 15.5 % — ABNORMAL HIGH (ref 11.5–14.6)

## 2010-04-14 NOTE — Medication Information (Signed)
Summary: Physician Orders  Physician Orders   Imported By: Roderic Ovens 04/06/2009 12:07:06  _____________________________________________________________________  External Attachment:    Type:   Image     Comment:   External Document

## 2010-04-14 NOTE — Progress Notes (Signed)
Summary: ASPIRIN  Phone Note Call from Patient Call back at Home Phone [phone]   Caller: Daughter/[name] Reason for Call: Talk to Nurse Summary of Call: request to speak to nurse about ASA, states [facility] is not giving him any Initial call taken by: Migdalia Dk,  Jul 22, 2009 3:43 PM  Follow-up for Phone Call        I spoke with the pt's daughter and she was concerned because the pt does not have ASA on his medication list.  The pt's coumadin was stopped in January.  Dr Excell Seltzer did want the pt to remain on ASA 81mg  daily.  The pt is a resident at [facility] phone [phone].  Glade Lloyd is the current person on duty working with this pt.  Follow-up by: Julieta Gutting, RN, BSN,  Jul 22, 2009 4:06 PM  Additional Follow-up for Phone Call Additional follow up Details #1::        I spoke with Tela at [facility] and she said the pt was not taking ASA.  She has reviewed the pt's chart and it had not been stopped by another physician and is unsure why it is not on the medication list.  She would like an order faxed to  [phone] Attn: Tela to restart ASA 81mg .  Order faxed to [facility].   Additional Follow-up by: Julieta Gutting, RN, BSN,  Jul 22, 2009 5:11 PM

## 2010-04-14 NOTE — Medication Information (Signed)
Summary: Physician Orders   Physician Orders   Imported By: Roderic Ovens 10/08/2009 15:22:41  _____________________________________________________________________  External Attachment:    Type:   Image     Comment:   External Document

## 2010-04-14 NOTE — Letter (Signed)
[facility]   Imported By: Sherian Rein 02/09/2010 10:29:58  _____________________________________________________________________  External Attachment:    Type:   Image     Comment:   External Document

## 2010-04-14 NOTE — Letter (Signed)
Summary: [facility]   Imported By: Lennie Odor 02/18/2010 10:56:46  _____________________________________________________________________  External Attachment:    Type:   Image     Comment:   External Document

## 2010-04-14 NOTE — Assessment & Plan Note (Signed)
Summary: f/u--ch.   History of Present Illness Visit Type: Follow-up Visit Primary GI MD: Stan Head MD [facility] Primary Provider: Dr Sherri Rad -[facility] Requesting Provider: n/a Chief Complaint: abdominal pain primarily at night History of Present Illness:   77 yo wm with IBS issues over the years. He had been having abdominal pain problems, was set up to see me and cancelled due to "blood clot on the brain". He has been having LUQ pain bothering him some at night, PeptoBismol did not help and neither did the hyoscyamine but it was likely expired. This LUQ pain is same as he has had in the past. Dauhter provides history also. He resides at [facility].    GI Review of Systems    Reports abdominal pain.      Denies acid reflux, belching, bloating, chest pain, dysphagia with liquids, dysphagia with solids, heartburn, loss of appetite, nausea, vomiting, vomiting blood, weight loss, and  weight gain.      Reports constipation.     Denies anal fissure, black tarry stools, change in bowel habit, diarrhea, diverticulosis, fecal incontinence, heme positive stool, hemorrhoids, irritable bowel syndrome, jaundice, light color stool, liver problems, rectal bleeding, and  rectal pain.    Current Medications (verified): 1)  Flomax 0.4 Mg  Cp24 (Tamsulosin Hcl) .... One Tablet By Mouth in The Afternoon 2)  Allopurinol 100 Mg  Tabs (Allopurinol) .... Take One Tab By Mouth Daily 3)  Vitamin D3 1000 Unit  Tabs (Cholecalciferol) .[name].. 1 By Mouth Daily 4)  Aspirin 81 Mg  Tbec (Aspirin) .... One By Mouth Every Day 5)  Meclizine Hcl 25 Mg  Chew (Meclizine Hcl) .[name].. 1 Every 6-8 Hrs As Needed For Vertigo/dizziness 6)  Miralax  Powd (Polyethylene Glycol 3350) .... As Needed 7)  Xanax 0.25mg  .... As Needed 8)  Kapedex 60mg  Tablets .... Take 1 Tablet By Mouth Once A Day 9)  Hydrocodone-Acetaminophen 7.5-750 Mg Tabs (Hydrocodone-Acetaminophen) .... As Needed 10)  Promethazine Hcl 25 Mg Tabs  (Promethazine Hcl) .... As Needed For Nausea 11)  Pilocarpine Hcl 1 % Soln (Pilocarpine Hcl) .... Three Times A Day 12)  Triamterene-Hctz 37.5-25 Mg Tabs (Triamterene-Hctz) .... Take 1/2 Tablet Daily 13)  Dexilant 60 Mg Cpdr (Dexlansoprazole) .[name].. 1 By Mouth 30 Mins Before Breakfast  Allergies (verified): 1)  ! Fludrocortisone Acetate (Fludrocortisone Acetate) 2)  ! Augmentin 3)  Transderm-Scop (Scopolamine Base) 4)  Diazepam (Diazepam)  Past History:  Past Medical History: PERIPHERAL VASCULAR DISEASE (ICD-443.9) CORONARY ARTERY DISEASE (ICD-414.00) - prior LCx stenting HYPERLIPIDEMIA (ICD-272.4) CEREBROVASCULAR ACCIDENT, HX OF (ICD-V12.50) DEEP VENOUS THROMBOPHLEBITIS (ICD-453.40) GERD (ICD-530.81) EDEMA (ICD-782.3) ANXIETY (ICD-300.00) VERTIGO (ICD-780.4) IRRITABLE BOWEL SYNDROME (ICD-564.1) DUODENITIS, WITHOUT HEMORRHAGE (ICD-535.60) GASTRITIS (ICD-535.50) ESOPHAGEAL STRICTURE (ICD-530.3) CELLULITIS, LEG, LEFT (ICD-682.6) PULMONARY EMBOLISM, HX OF (ICD-V12.51) BENIGN PROSTATIC HYPERTROPHY, HX OF (ICD-V13.8) HIATAL HERNIA, HX OF (ICD-V12.79) Frequent Falls Subdural hematoma  Past Surgical History: Reviewed history from 02/08/2009 and no changes required. hand surgery angioplasty Carotid endarterectomy L bare-metal stenting of his  left circumflex  .Bilateral cataract surgery.  Family History: Reviewed history from 02/08/2009 and no changes required. No FH of Colon Cancer:   Noted that patient has a brother with coronary  artery disease and his father died with an MI.  Social History: Reviewed history from 02/08/2009 and no changes required. Married, children Former Smoker Illicit Drug Use - no Alcohol Use - yes  wine occ. [employer] Cytogeneticist, Immunologist [place]  Vital Signs:  Patient profile:   77 year old male  Height:      72 inches Weight:      173.25 pounds BMI:     23.58 Pulse rate:   68 / minute Pulse rhythm:   regular BP  sitting:   142 / 82  (left arm)  Vitals Entered By: Milford Cage NCMA (December 17, 2009 3:59 PM)  Physical Exam  General:  elderly NAD Abdomen:  soft and nontender without HSM/mass   Impression & Recommendations:  Problem # 1:  IRRITABLE BOWEL SYNDROME (ICD-564.1) Assessment Deteriorated refill hyoscyamine 0.125 mg, I think he tried expired hyoscyamine that was ineffective he has had chronic recurrent upper abdominal pain and it sounds like this is what had flared  Problem # 2:  VERTIGO (ICD-780.4) Assessment: Unchanged asking for Zofran for nausea associated with this so will fill  Patient Instructions: 1)  Please pick up your medications at your pharmacy. 2)  Have restarted hyoscyamine which is for abdominal pain and given a presciption for Zofran to help with nausea and vomiting. 3)  If these medicines fail to help let Dr. Leone Payor know. 4)  Copy sent to Gilmore Laroche, MD 5)  The medication list was reviewed and reconciled.  All changed / newly prescribed medications were explained.  A complete medication list was provided to the patient / caregiver. Prescriptions: ZOFRAN 4 MG TABS (ONDANSETRON HCL) 1 by mouth every 6 hours as needed for nausea and vomiting  #12 x 1   Entered and Authorized by:   Iva Boop MD, [facility]   Signed by:   Iva Boop MD, FACG on 12/17/2009   Method used:   Electronically to        [facility] [address] [phone]* (retail)       [address]       [place], [place]  [address]       Ph: [phone] or [phone]       Fax: [phone]   RxID:   [id number] HYOSCYAMINE SULFATE 0.125 MG SUBL (HYOSCYAMINE SULFATE) 1-2 tablets under the tongue as needed for abdominal  #60 x 2   Entered and Authorized by:   Iva Boop MD, [facility]   Signed by:   Iva Boop MD, [facility] on 12/17/2009   Method used:   Electronically to        [facility] [address] [phone]* (retail)       [address]       [place], [place]  [address]       Ph: [phone] or [phone]       Fax: [phone]   RxID:   [id number]

## 2010-04-14 NOTE — Miscellaneous (Signed)
  Clinical Lists Changes  Observations: Added new observation of DOPPLER LEG: No evidence of lower extremity deep or superficial venous thrombus or incompetence, bilaterally. (02/12/2009 11:05)      Venous Doppler  Procedure date:  02/12/2009  Findings:      No evidence of lower extremity deep or superficial venous thrombus or incompetence, bilaterally.

## 2010-04-14 NOTE — Progress Notes (Signed)
Summary: CX appt  Phone Note Call from Patient Call back at Home Phone [phone]   Caller: Patient's Daughter-[name] Pruitt Call For: Dr Leone Payor Reason for Call: Talk to Doctor Details for Reason: CX Appt Summary of Call: Pt's daughter called and stated that her father has a blood clot in his brain and will not be able to keep appt tomorrow. She is very concerned that her father will be discharged for not keeping appts, wanted Dr Leone Payor to know the reason for cancellation and that both her and her father apologize and would like to continue care once he is able. Initial call taken by: Dwan Bolt,  November 23, 2009 1:08 PM  Follow-up for Phone Call        Advised Ms. Pruitt we recieved her message and would cancel her dad's appt for tomorrow.  Advised her to call back and schedule when her dad is well and I would pass this message on to Dr. Leone Payor. Follow-up by: Francee Piccolo CMA Duncan Dull),  November 23, 2009 5:21 PM

## 2010-04-14 NOTE — Assessment & Plan Note (Signed)
Summary: [facility]   Visit Type:  6 months follow up Primary Provider:  Dr Sherri Rad -[facility]  CC:  Sob.  History of Present Illness: This is an 77 year old gentleman with coronary artery disease presenting today for followup. He underwent bare-metal stenting of the left circumflex back in 2008. 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 That was his first thromboembolic event. Coumadin was discontinued last year because of recurrent falls, advanced age, and risk of bleeding.  His main complaint is dizziness and feeling of being 'swimmy-headed.' This is nonpositional. He denies chest pain or dyspnea. No edema. Complains of generalized fatigue and tiredness, but no other complaints.  Current Medications (verified): 1)  Flomax 0.4 Mg  Cp24 (Tamsulosin Hcl) .... One Tablet By Mouth in The Afternoon 2)  Allopurinol 100 Mg  Tabs (Allopurinol) .... Take One Tab By Mouth Daily 3)  Vitamin D3 1000 Unit  Tabs (Cholecalciferol) .[name].. 1 By Mouth Daily 4)  Aspirin 81 Mg  Tbec (Aspirin) .... One By Mouth Every Day 5)  Meclizine Hcl 25 Mg  Chew (Meclizine Hcl) .[name].. 1 Every 6-8 Hrs As Needed For Vertigo/dizziness 6)  Miralax  Powd (Polyethylene Glycol 3350) .... As Needed 7)  Xanax 0.25mg  .... As Needed 8)  Hydrocodone-Acetaminophen 7.5-750 Mg Tabs (Hydrocodone-Acetaminophen) .... As Needed 9)  Triamterene-Hctz 37.5-25 Mg Tabs (Triamterene-Hctz) .... Take 1/2 Tablet Daily 10)  Dexilant 60 Mg Cpdr (Dexlansoprazole) .[name].. 1 By Mouth 30 Mins Before Breakfast 11)  Hyoscyamine Sulfate 0.125 Mg Subl (Hyoscyamine Sulfate) .[name].. 1-2 Tablets Under The Tongue As Needed For Abdominal 12)  Zofran 4 Mg Tabs (Ondansetron Hcl) .[name].. 1 By Mouth Every 6 Hours As Needed For Nausea and Vomiting  Allergies: 1)  ! Fludrocortisone Acetate (Fludrocortisone Acetate) 2)  ! Augmentin 3)  Transderm-Scop (Scopolamine Base) 4)  Diazepam (Diazepam)  Past History:  Past medical  history reviewed for relevance to current acute and chronic problems.  Past Medical History: Reviewed history from 12/17/2009 and no changes required. PERIPHERAL VASCULAR DISEASE (ICD-443.9) CORONARY ARTERY DISEASE (ICD-414.00) - prior LCx stenting HYPERLIPIDEMIA (ICD-272.4) CEREBROVASCULAR ACCIDENT, HX OF (ICD-V12.50) DEEP VENOUS THROMBOPHLEBITIS (ICD-453.40) GERD (ICD-530.81) EDEMA (ICD-782.3) ANXIETY (ICD-300.00) VERTIGO (ICD-780.4) IRRITABLE BOWEL SYNDROME (ICD-564.1) DUODENITIS, WITHOUT HEMORRHAGE (ICD-535.60) GASTRITIS (ICD-535.50) ESOPHAGEAL STRICTURE (ICD-530.3) CELLULITIS, LEG, LEFT (ICD-682.6) PULMONARY EMBOLISM, HX OF (ICD-V12.51) BENIGN PROSTATIC HYPERTROPHY, HX OF (ICD-V13.8) HIATAL HERNIA, HX OF (ICD-V12.79) Frequent Falls Subdural hematoma  Review of Systems       Negative except as per HPI   Vital Signs:  Patient profile:   77 year old male Height:      72 inches Weight:      172.75 pounds BMI:     23.51 Pulse rate:   57 / minute Pulse rhythm:   regular Resp:     18 per minute BP sitting:   110 / 80  (left arm) Cuff size:   regular  Vitals Entered By: Vikki Ports (April 05, 2010 4:14 PM)  Physical Exam  General:  Pt is alert and oriented, elderly male in no acute distress. HEENT: normal Neck: normal carotid upstrokes without bruits, JVP normal Lungs: CTA CV: RRR without murmur or gallop Abd: soft, NT, positive BS, no bruit, no organomegaly Ext: no clubbing, cyanosis, or edema. peripheral pulses 2+ and equal Skin: warm and dry without rash    EKG  Procedure date:  04/05/2010  Findings:      Sinus bradycardia, 57 bpm, within normal limits.  Impression & Recommendations:  Problem # 1:  CORONARY ARTERY DISEASE (ICD-414.00) Pt stable without angina. Continue antiplatelet Rx with ASA. No beta blocker secondary to resting bradycardia and normal BP.  Lipids managed by PCP and in the setting of advanced age I have not pushed the issue of a  statin drug in order to avoid polypharmacy in this pt currently on 12 medications.  His updated medication list for this problem includes:    Aspirin 81 Mg Tbec (Aspirin) ..... One by mouth every day  Orders: EKG w/ Interpretation (93000)  Problem # 2:  HYPERTENSION, BENIGN (ICD-401.1) controlled.  His updated medication list for this problem includes:    Aspirin 81 Mg Tbec (Aspirin) ..... One by mouth every day    Triamterene-hctz 37.5-25 Mg Tabs (Triamterene-hctz) .[name]... Take 1/2 tablet daily  BP today: 110/80 Prior BP: 142/82 (12/17/2009)  Labs Reviewed: K+: 4.1 (09/07/2009) Creat: : 1.4 (09/07/2009)   Chol: 211 (01/18/2007)   HDL: 37.0 (01/18/2007)   LDL: DEL (01/18/2007)   TG: 81 (01/18/2007)  Patient Instructions: 1)  Your physician recommends that you schedule a follow-up appointment in: 6 months 2)  Your physician recommends that you continue on your current medications as directed. Please refer to the Current Medication list given to you today.

## 2010-04-14 NOTE — Procedures (Signed)
Summary: Modified Barium Swallow  MODIFIED BARIUM SWALLOW STUDY SWALLOW HISTORY  16Feb11 12:39pm LL  SWALLOW HX    MBSS Outpatient                          Yes        16Feb11 12:43pm LL    Therapy Diagnosis Impairment             Dysphagia  16Feb11 12:43pm LL    Dx/Referral Reason/Past Medical Hx       Yes        16Feb11 12:43pm LL           Comment: 77 yr old seen for outpatient MBS with complaints of           coughing with foods/liquids.  PMH includes TIA 2008,           decreased saliva production and on medication, chronic dry           cough, pna, bronchitis, GERD.    Verbal consent obtained for MBSS         Patient    16Feb11 12:43pm LL    Pt FollowsDirections/CompleteStrategies  Yes        16Feb11 12:43pm LL    Dentition condition                      Adequate   16Feb11 12:43pm LL    Postural control adequate for testing    Yes        16Feb11 12:43pm LL    Current Diet                             Regular    16Feb11 12:43pm LL    Liquids                                  Thin       16Feb11 12:43pm LL    Oral Motor Evaluation                    WFL        16Feb11 12:43pm LL    Pain Intensity                           none       16Feb11 12:43pm LL    Penetration/Aspiration Scale Guide       Yes        16Feb11 12:43pm LL           Comment: 1 = material does not enter airway           2 = material enters airway, remains ABOVE vocal cords           then ejected out           3 = material enters airway, remains ABOVE cords and not           ejected out           4 = material enters airway, CONTACTS cords then ejected           out           5 = material enters airway, CONTACTS cords and not ejected  out           6 = material enters airway, passes BELOW cords then ejected           out           7 = material enters airway, passes BELOW cords and not           ejected out despite cough attempt by patient           8 = material enters airway, passes BELOW cords without       attempt by patient to eject out(silent aspiration)  SWALLOW PHASE THIN/TSP  16Feb11 12:39pm LL  ORAL THIN TEASPOON  ORAL PHARYNGEAL THIN TEASPOON  PHARYNGEAL THIN TEASPOON    Decreased tongue base retraction-TT      Yes        16Feb11 12:47pm LL    Decreased laryngeal elevation-TT         Yes        16Feb11 12:45pm LL    Decreased epiglottic deflection-TT       Yes        16Feb11 12:45pm LL    Decreased laryngeal closure-TT           Yes        16Feb11 12:45pm LL    Vallecular Residue-TT                    Mild       16Feb11 12:47pm LL    Pyriform Sinus Residue-TT                Trace      16Feb11 12:47pm LL    Laryngeal Penetration-TT                 Flash      16Feb11 12:45pm LL    Penetration/Aspiration Scale-TT          1,2        16Feb11 12:45pm LL  SWALLOW PHASE THIN/CUP  16Feb11 12:39pm LL  ORAL THIN CUP  ORAL PHARYNGEAL THIN CUP  PHARYNGEAL THIN CUP    Decreased tongue base retraction-TC      Yes        16Feb11 12:46pm LL    Decreased laryngeal elevation-TC         Yes        16Feb11 12:46pm LL    Decreased epiglottic deflection-TC       Yes        16Feb11 12:46pm LL    Decreased laryngeal closure-TC           Yes        16Feb11 12:46pm LL    Vallecular Residue-TC                    Mild       16Feb11 12:46pm LL    Pyriform Sinus Residue-TC                Trace      16Feb11 12:46pm LL    Laryngeal Penetration-TC                 Flash      16Feb11 12:46pm LL    Compensatory strategies-TC               Yes        16Feb11 12:46pm LL           Comment: Penetration continued with the chin tuck.  Penetration/Aspiration Scale-TC          2,3        16Feb11 12:46pm LL  SWALLOW PHASE THIN/STRAW  16Feb11 12:39pm LL  ORAL THIN STRAW    Not tested-TS                            Yes        16Feb11 12:45pm LL  ORAL PHARYNGEAL THIN STRAW  PHARYNGEAL THIN STRAW  SWALLOW PHASE NECTAR/TSP  16Feb11 12:39pm LL  ORAL NECTAR TEASPOON  ORAL PHARYNGEAL NECTAR TEASPOON  PHARYNGEAL  NECTAR TEASPOON    Decreased tongue base retraction-NT      Yes        16Feb11 12:45pm LL    Decreased laryngeal elevation-NT         Yes        16Feb11 12:45pm LL    Vallecular Residue-NT                    Mild       16Feb11 12:45pm LL    Pyriform Sinus Residue-NT                Mild       16Feb11 12:45pm LL    Penetration/Aspiration Scale-NT          1          16Feb11 12:45pm LL  SWALLOW PHASE NECTAR/CUP  16Feb11 12:39pm LL  ORAL NECTAR CUP  ORAL PHARNYGEAL NECTAR CUP  PHARYNGEAL NECTAR CUP    Decreased tongue base retraction-[place]      Yes        16Feb11 12:44pm LL    Decreased laryngeal elevation-[place]         Yes        16Feb11 12:44pm LL    Vallecular Residue-[place]                    Mild       16Feb11 12:44pm LL    Pyriform Sinus Residue-[place]                Mild       16Feb11 12:44pm LL    Compensatory strategies-[place]               Yes        16Feb11 12:53pm LL           Comment: CUes to swallow a 2nd time helped clear majority of           residue.    Penetration/Aspiration Scale-[place]          1          16Feb11 12:44pm LL  SWALLOW PHASE NECTAR/STRAW  16Feb11 12:39pm LL  ORAL NECTAR STRAW    Not tested-NS                            Yes        16Feb11 12:44pm LL  ORAL PHARYNGEAL NECTAR STRAW  PHARYNGEAL NECTAR STRAW  SWALLOW PHASE HONEY/TSP  16Feb11 12:39pm LL  ORAL HONEY TEASPOON    Not tested-HT                            Yes        16Feb11 12:44pm LL  ORAL PHARYNGEAL HONEY TEASPOON  PHARYNGEAL HONEY TEASPOON  SWALLOW PHASE HONEY/CUP  16Feb11 12:39pm LL  ORAL HONEY CUP    Not tested-HC                            Yes        16Feb11 12:44pm LL  ORAL PHARYNGEAL HONEY CUP  PHARYNGEAL HONEY CUP  SWALLOW PHASE PUREE  16Feb11 12:39pm LL  ORAL PUREE    Not tested-P                             Yes        16Feb11 12:44pm LL  ORAL PHARYNGEAL PUREE  PHARYNGEAL PUREE  SWALLOW PHASE MECHANICAL SOFT  16Feb11 12:39pm LL  ORAL MECHANICAL SOFT  ORAL PHARYNGEAL MECHANICAL SOFT   PHARYNGEAL MECHANICAL SOFT    Decreased tongue base retraction-MS      Yes        16Feb11 12:44pm LL    Vallecular Residue-MS                    mod-severe 16Feb11 12:44pm LL    Penetration/Aspiration Scale-MS          1          16Feb11 12:44pm LL  SWALLOW PHASE MIXED  16Feb11 12:39pm LL  ORAL MIXED CONSISTENCY    Not tested-MC                            Yes        16Feb11 12:43pm LL  ORAL PHARYNGEAL MIXED CONSISTENCY  PHARYNGEAL MIXED CONSISTENCY  SWALLOW PHASE PILL  16Feb11 12:39pm LL  ORAL PILL    Not tested-PL                            Yes        16Feb11 12:43pm LL  ORAL PHARYNGEAL PILL  PHARYNGEAL PILL  SWALLOW EVALUATION  16Feb11 12:39pm LL  SWALLOW EVAL    Normal UES Relaxation                    Yes        16Feb11 12:53pm LL    Esophageal Bolus Transit                 Yes        16Feb11 12:53pm LL           Comment: Brief scan of esophagus with possible stasis near           upper esophagus (no radiologist confirmation).  Difficult to           view image.  MBSS RECOMMENDATIONS    Clinical Impressions/Functional Problems Yes        16Feb11 12:59pm LL           Comment: Pt. demonstrated an overall mild-moderate pharyngeal           dysphagia. Decreased ROM with laryngeal elevation resulted in           penetration with thin barium (both flash penetration and           slight amount barium on anterior part of laryngeal vestibule           that pt. did not sense).  A chin tuck was attempted with  several trials with thin, however, did not prevent           penetration.  Cues to swallow a second time helped reduce           majority of residue.  Swallow initiation was timely           throughout MBS. Pt.'s aspiration risk is reduced with nectar           thick liquids and compensatory strategies.    Diet recommendations                     Regular    16Feb11 12:59pm LL    Liquid recommendations                   Nectar     16Feb11 12:59pm LL    Swallowing  therapy recommended           Yes        16Feb11 12:59pm LL    Able to participate in swallow therapy   Yes        16Feb11 12:59pm LL    Other MBSS Recommendations               Yes        16Feb11 12:59pm LL           Comment: Home health speech therapy for observation with po's           and further education for thickening liquids, compensatory           strategies and possible pharyngeal strengthening exercises.  MBSS ASPIRATION PRECAUTIONS    Supervision needed                       Intermit   16Feb11 12:59pm LL    Give whole meds in                       Puree      16Feb11 12:59pm LL    Eat/drink only when alert                Yes        16Feb11 12:59pm LL    Seated/upright, 90 degrees               Yes        16Feb11 12:59pm LL    No Straws                                Yes        16Feb11 12:59pm LL    Small bites/sips                         Yes        16Feb11 12:59pm LL    Swallow 2-3 times after each bite/sip    Yes        16Feb11 12:59pm LL    Follow solid with liquids                Yes        16Feb11 12:59pm LL    RefluxPrecaution-upright during/1hr. pc  Yes        16Feb11 12:59pm LL    Clear throat intermittently              Yes  [id number] 12:59pm LL  TREATMENT PLAN AND GOALS  16Feb11 12:39pm LL  MBSS TREATMENT PLAN/GOALS    Inpatient Treatment Plan N/A             Yes        16Feb11 12:59pm LL  MBSS MUTUAL GOALS  MBSS INTERVENTIONS  MBSS EDUCATION AND DISCHARGE  16Feb11 12:39pm LL  MBSS PATIENT/FAMILY EDUCATION    Recommend patient receive f/u therapy at [facility] 16Feb11 13:00pm LL    Compensatory strategies provided         DemoVerbal 16Feb11 13:00pm LL    Diet modification provided               Verbal     16Feb11 13:00pm LL    Aspiration precautions provided          Verbal     16Feb11 13:00pm LL    Requires further education               Pt/family  16Feb11 13:00pm LL  MBSS TIME    MBSS Therapy Time In                     1115       16Feb11 13:00pm LL     MBSS Therapy Time Out                    1155       16Feb11 13:00pm LL    MBSS Therapy Charges/minutes             MBS        16Feb11 13:00pm LL  MBSS REPORTS    MBSS Copy to Referring Physician         Yes        16Feb11 13:00pm LL  MBSS REVIEW OF STUDENT COMPLETION

## 2010-04-14 NOTE — Letter (Signed)
Summary: [facility]   Imported By: Sherian Rein 03/15/2010 10:41:36  _____________________________________________________________________  External Attachment:    Type:   Image     Comment:   External Document

## 2010-04-14 NOTE — Assessment & Plan Note (Signed)
Summary: ROV   Visit Type:  Follow-up Primary Provider:  Dr Sherri Rad -[facility]  CC:  BP issues.  History of Present Illness: This is an 77 year old gentleman with coronary artery disease presenting today for followup. He underwent bare-metal stenting of the left circumflex back in 2008. 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 That was his first thromboembolic event. Coumadin was discontinued earlier this year because of recurrent falls, advanced age, and risk of bleeding.  He reports recent problems with elevated blood pressure and was recently started on triamterene-HCTZ.  Denies chest pain, dyspnea, or edema.  Current Medications (verified): 1)  Flomax 0.4 Mg  Cp24 (Tamsulosin Hcl) .... One Tablet By Mouth in The Afternoon 2)  Allopurinol 100 Mg  Tabs (Allopurinol) .... Take One Tab By Mouth Daily 3)  Vitamin D3 1000 Unit  Tabs (Cholecalciferol) .[name].. 1 By Mouth Daily 4)  Aspirin 81 Mg  Tbec (Aspirin) .... One By Mouth Every Day 5)  Meclizine Hcl 25 Mg  Chew (Meclizine Hcl) .[name].. 1 Every 6-8 Hrs As Needed For Vertigo/dizziness 6)  Hyoscyamine Sulfate 0.125 Mg Tabs (Hyoscyamine Sulfate) .... Take 1 Tablet Under Tongue 7)  Miralax  Powd (Polyethylene Glycol 3350) .... As Needed 8)  Xanax 0.25mg  .... As Needed 9)  Kapedex 60mg  Tablets .... Take 1 Tablet By Mouth Once A Day 10)  Hydrocodone-Acetaminophen 7.5-750 Mg Tabs (Hydrocodone-Acetaminophen) .... As Needed 11)  Promethazine Hcl 25 Mg Tabs (Promethazine Hcl) .... As Needed 12)  Pilocarpine Hcl 1 % Soln (Pilocarpine Hcl) .... Three Times A Day 13)  Triamterene-Hctz 37.5-25 Mg Tabs (Triamterene-Hctz) .... Take 1/2 Tablet Daily  Allergies: 1)  ! Fludrocortisone Acetate (Fludrocortisone Acetate) 2)  ! Augmentin 3)  Transderm-Scop (Scopolamine Base) 4)  Diazepam (Diazepam)  Past History:  Past medical history reviewed for relevance to current acute and chronic problems.  Past Medical  History: Current Problems:  PERIPHERAL VASCULAR DISEASE (ICD-443.9) CORONARY ARTERY DISEASE (ICD-414.00) - prior LCx stenting HYPERLIPIDEMIA (ICD-272.4) CEREBROVASCULAR ACCIDENT, HX OF (ICD-V12.50) DEEP VENOUS THROMBOPHLEBITIS (ICD-453.40) GERD (ICD-530.81) EDEMA (ICD-782.3) ANXIETY (ICD-300.00) VERTIGO (ICD-780.4) IRRITABLE BOWEL SYNDROME (ICD-564.1) DUODENITIS, WITHOUT HEMORRHAGE (ICD-535.60) GASTRITIS (ICD-535.50) ESOPHAGEAL STRICTURE (ICD-530.3) CELLULITIS, LEG, LEFT (ICD-682.6) PULMONARY EMBOLISM, HX OF (ICD-V12.51) BENIGN PROSTATIC HYPERTROPHY, HX OF (ICD-V13.8) HIATAL HERNIA, HX OF (ICD-V12.79) Frequent Falls  Review of Systems       Positive for fatigue, otherwise negative except as per HPI   Vital Signs:  Patient profile:   77 year old male Height:      72 inches Weight:      178.75 pounds BMI:     24.33 Pulse rate:   58 / minute Pulse rhythm:   regular Resp:     18 per minute BP sitting:   148 / 84  (left arm) Cuff size:   large  Vitals Entered By: Vikki Ports (September 07, 2009 3:31 PM)  Physical Exam  General:  Pt is alert and oriented, elderly male, in no acute distress. HEENT: normal Neck: normal carotid upstrokes without bruits, JVP normal Lungs: CTA CV: RRR without murmur or gallop Abd: soft, NT, positive BS, no bruit, no organomegaly Ext: no clubbing, cyanosis, or edema. peripheral pulses 2+ and equal Skin: warm and dry without rash    EKG  Procedure date:  09/07/2009  Findings:      Sinus bradycardia with incomplete RBBB 59 bpm.  Impression & Recommendations:  Problem # 1:  HYPERTENSION, BENIGN (ICD-401.1) BP appears improved with triamterene-HCTZ. With his  advanced age and requent falls, a goal systolic of less than 160 mmHg is reasonable. Will check a metabolic panel since he was recently started on diuretic Rx.  His updated medication list for this problem includes:    Aspirin 81 Mg Tbec (Aspirin) ..... One by mouth every day     Triamterene-hctz 37.5-25 Mg Tabs (Triamterene-hctz) .[name]... Take 1/2 tablet daily  Problem # 2:  CORONARY ARTERY DISEASE (ICD-414.00) Stable without angina.  His updated medication list for this problem includes:    Aspirin 81 Mg Tbec (Aspirin) ..... One by mouth every day  Orders: EKG w/ Interpretation (93000) TLB-BMP (Basic Metabolic Panel-BMET) (80048-METABOL) TLB-CBC Platelet - w/Differential (85025-CBCD)  Problem # 3:  HYPERLIPIDEMIA (ICD-272.4) Followed by PCP - statin intolerant.  Orders: EKG w/ Interpretation (93000) TLB-BMP (Basic Metabolic Panel-BMET) (80048-METABOL) TLB-CBC Platelet - w/Differential (85025-CBCD)  CHOL: 211 (01/18/2007)   LDL: DEL (01/18/2007)   HDL: 37.0 (01/18/2007)   TG: 81 (01/18/2007)  Patient Instructions: 1)  Your physician recommends that you have lab work today: CBC, BMP 2)  Your physician recommends that you continue on your current medications as directed. Please refer to the Current Medication list given to you today. 3)  Your physician wants you to follow-up in:   6 MONTHS. You will receive a reminder letter in the mail two months in advance. If you don't receive a letter, please call our office to schedule the follow-up appointment.

## 2010-04-14 NOTE — Medication Information (Signed)
Summary: [facility]  Anticoagulant Therapy  Managed by: Inactive PCP: Dr Sherri Rad -[facility] Supervising MD: Ladona Ridgel MD, Sharlot Gowda Indication 1: Pulmonary embolus (ICD-415.19) Indication 2: Deep Vein Thrombosis - Leg (ICD-451.1) Lab Used: [facility] Site: [facility] INR RANGE 2 - 3          Comments: Dr Excell Seltzer d/c pt's coumadin per 03/11/09 doppler results in EMR. Cloyde Reams RN  April 02, 2009 4:53 PM   Allergies: 1)  ! Fludrocortisone Acetate (Fludrocortisone Acetate) 2)  Transderm-Scop (Scopolamine Base) 3)  Diazepam (Diazepam)  Anticoagulation Management History:      Positive risk factors for bleeding include an age of 75 years or older.  The bleeding index is 'intermediate risk'.  Positive CHADS2 values include Age > 6 years old.  The start date was 07/24/2005.  Anticoagulation responsible provider: Ladona Ridgel MD, Sharlot Gowda.  Exp: 04/2010.    Anticoagulation Management Assessment/Plan:      The target INR is 2 - 3.  The next INR is due 02/23/2009.  Anticoagulation instructions were given to patient/daughter.  Results were reviewed/authorized by Inactive.         Prior Anticoagulation Instructions: INR 3.4  Skip today's dose of coumadin then start taking 3mg  daily except 6mg  on Sundays, Tuesdays, and Thursdays.  Recheck in 2 weeks.

## 2010-05-24 LAB — CBC
MCH: 35.6 pg — ABNORMAL HIGH (ref 26.0–34.0)
MCHC: 34.4 g/dL (ref 30.0–36.0)
Platelets: 185 10*3/uL (ref 150–400)

## 2010-05-24 LAB — URINALYSIS, ROUTINE W REFLEX MICROSCOPIC
Glucose, UA: NEGATIVE mg/dL
Ketones, ur: NEGATIVE mg/dL
Leukocytes, UA: NEGATIVE
Protein, ur: NEGATIVE mg/dL

## 2010-05-24 LAB — COMPREHENSIVE METABOLIC PANEL
AST: 22 U/L (ref 0–37)
Albumin: 3.4 g/dL — ABNORMAL LOW (ref 3.5–5.2)
Calcium: 9.1 mg/dL (ref 8.4–10.5)
Creatinine, Ser: 1.34 mg/dL (ref 0.4–1.5)
GFR calc Af Amer: >60 mL/min (ref >60)
Total Protein: 6.8 g/dL (ref 6.0–8.3)

## 2010-05-24 LAB — DIFFERENTIAL
Eosinophils Relative: 2 % (ref 0–5)
Lymphocytes Relative: 17 % (ref 12–46)
Lymphs Abs: 1.6 10*3/uL (ref 0.7–4.0)
Monocytes Absolute: 0.6 10*3/uL (ref 0.1–1.0)

## 2010-05-24 LAB — LIPASE, BLOOD: Lipase: 25 U/L (ref 11–59)

## 2010-05-24 LAB — URINE CULTURE
Colony Count: NO GROWTH
Culture  Setup Time: 201111110422

## 2010-05-25 LAB — URINALYSIS, ROUTINE W REFLEX MICROSCOPIC
Glucose, UA: NEGATIVE mg/dL
Ketones, ur: NEGATIVE mg/dL
Specific Gravity, Urine: 1.013 (ref 1.005–1.030)
pH: 7 (ref 5.0–8.0)

## 2010-05-25 LAB — URINE CULTURE
Colony Count: NO GROWTH
Culture  Setup Time: 201110231158

## 2010-05-27 LAB — URINALYSIS, ROUTINE W REFLEX MICROSCOPIC
Bilirubin Urine: NEGATIVE
Glucose, UA: NEGATIVE mg/dL
Hgb urine dipstick: NEGATIVE
Ketones, ur: NEGATIVE mg/dL
Protein, ur: NEGATIVE mg/dL

## 2010-05-27 LAB — COMPREHENSIVE METABOLIC PANEL
Alkaline Phosphatase: 61 U/L (ref 39–117)
BUN: 22 mg/dL (ref 6–23)
CO2: 29 mEq/L (ref 19–32)
Calcium: 8.6 mg/dL (ref 8.4–10.5)
GFR calc non Af Amer: 47 mL/min — ABNORMAL LOW (ref >60)
Glucose, Bld: 134 mg/dL — ABNORMAL HIGH (ref 70–99)
Total Protein: 6.7 g/dL (ref 6.0–8.3)

## 2010-05-27 LAB — DIFFERENTIAL
Basophils Absolute: 0.1 10*3/uL (ref 0.0–0.1)
Basophils Relative: 1 % (ref 0–1)
Eosinophils Absolute: 0.1 10*3/uL (ref 0.0–0.7)
Eosinophils Relative: 1 % (ref 0–5)

## 2010-05-27 LAB — GLUCOSE, CAPILLARY: Glucose-Capillary: 120 mg/dL — ABNORMAL HIGH (ref 70–99)

## 2010-05-27 LAB — CBC
HCT: 44.7 % (ref 39.0–52.0)
Hemoglobin: 15.7 g/dL (ref 13.0–17.0)
MCHC: 35.1 g/dL (ref 30.0–36.0)
MCV: 103.4 fL — ABNORMAL HIGH (ref 78.0–100.0)
RDW: 14.3 % (ref 11.5–15.5)

## 2010-05-29 LAB — DIFFERENTIAL
Basophils Absolute: 0.1 10*3/uL (ref 0.0–0.1)
Basophils Relative: 1 % (ref 0–1)
Eosinophils Absolute: 0.3 10*3/uL (ref 0.0–0.7)
Monocytes Absolute: 0.6 10*3/uL (ref 0.1–1.0)
Neutro Abs: 3.4 10*3/uL (ref 1.7–7.7)
Neutrophils Relative %: 52 % (ref 43–77)

## 2010-05-29 LAB — CBC
MCHC: 33.7 g/dL (ref 30.0–36.0)
MCV: 100.6 fL — ABNORMAL HIGH (ref 78.0–100.0)
RDW: 14.1 % (ref 11.5–15.5)

## 2010-05-29 LAB — BASIC METABOLIC PANEL
CO2: 24 mEq/L (ref 19–32)
Calcium: 8.6 mg/dL (ref 8.4–10.5)
Creatinine, Ser: 1.07 mg/dL (ref 0.4–1.5)
Glucose, Bld: 94 mg/dL (ref 70–99)

## 2010-05-29 LAB — ABO/RH: ABO/RH(D): O NEG

## 2010-05-29 LAB — APTT: aPTT: 27 seconds (ref 24–37)

## 2010-05-29 LAB — PROTIME-INR: INR: 1.01 (ref 0.00–1.49)

## 2010-06-10 ENCOUNTER — Emergency Department (HOSPITAL_COMMUNITY): Payer: Medicare Other

## 2010-06-10 ENCOUNTER — Emergency Department (HOSPITAL_COMMUNITY)
Admission: EM | Admit: 2010-06-10 | Discharge: 2010-06-10 | Disposition: A | Discharge status: Home / Self Care | Payer: Medicare Other | Attending: Emergency Medicine | Admitting: Emergency Medicine

## 2010-06-10 DIAGNOSIS — Z8673 Personal history of transient ischemic attack (TIA), and cerebral infarction without residual deficits: Secondary | ICD-10-CM | POA: Insufficient documentation

## 2010-06-10 DIAGNOSIS — I10 Essential (primary) hypertension: Secondary | ICD-10-CM | POA: Insufficient documentation

## 2010-06-10 DIAGNOSIS — I25810 Atherosclerosis of coronary artery bypass graft(s) without angina pectoris: Secondary | ICD-10-CM | POA: Insufficient documentation

## 2010-06-10 DIAGNOSIS — R109 Unspecified abdominal pain: Secondary | ICD-10-CM | POA: Insufficient documentation

## 2010-06-10 DIAGNOSIS — R509 Fever, unspecified: Secondary | ICD-10-CM | POA: Insufficient documentation

## 2010-06-10 DIAGNOSIS — Z7901 Long term (current) use of anticoagulants: Secondary | ICD-10-CM | POA: Insufficient documentation

## 2010-06-10 DIAGNOSIS — Z86718 Personal history of other venous thrombosis and embolism: Secondary | ICD-10-CM | POA: Insufficient documentation

## 2010-06-10 DIAGNOSIS — R110 Nausea: Secondary | ICD-10-CM | POA: Insufficient documentation

## 2010-06-10 LAB — DIFFERENTIAL
Eosinophils Absolute: 0.2 10*3/uL (ref 0.0–0.7)
Lymphocytes Relative: 43 % (ref 12–46)
Lymphs Abs: 2.3 10*3/uL (ref 0.7–4.0)
Monocytes Relative: 11 % (ref 3–12)
Neutro Abs: 2.2 10*3/uL (ref 1.7–7.7)
Neutrophils Relative %: 42 % — ABNORMAL LOW (ref 43–77)

## 2010-06-10 LAB — CBC
HCT: 47.9 % (ref 39.0–52.0)
Hemoglobin: 16.7 g/dL (ref 13.0–17.0)
MCH: 34.6 pg — ABNORMAL HIGH (ref 26.0–34.0)
MCV: 99.2 fL (ref 78.0–100.0)
RBC: 4.83 MIL/uL (ref 4.22–5.81)
WBC: 5.3 10*3/uL (ref 4.0–10.5)

## 2010-06-10 LAB — POCT I-STAT, CHEM 8
BUN: 23 mg/dL (ref 6–23)
Calcium, Ion: 1.1 mmol/L — ABNORMAL LOW (ref 1.12–1.32)
Chloride: 104 mEq/L (ref 96–112)
HCT: 51 % (ref 39.0–52.0)
Potassium: 3.8 mEq/L (ref 3.5–5.1)

## 2010-06-10 LAB — URINALYSIS, ROUTINE W REFLEX MICROSCOPIC
Bilirubin Urine: NEGATIVE
Glucose, UA: NEGATIVE mg/dL
Hgb urine dipstick: NEGATIVE
Ketones, ur: NEGATIVE mg/dL
Specific Gravity, Urine: 1.014 (ref 1.005–1.030)
pH: 6.5 (ref 5.0–8.0)

## 2010-06-13 LAB — CBC
HCT: 41.3 % (ref 39.0–52.0)
Hemoglobin: 13.6 g/dL (ref 13.0–17.0)
MCHC: 33 g/dL (ref 30.0–36.0)
Platelets: 191 10*3/uL (ref 150–400)
RDW: 14.1 % (ref 11.5–15.5)

## 2010-06-13 LAB — URINE CULTURE: Colony Count: 10000

## 2010-06-13 LAB — URINALYSIS, ROUTINE W REFLEX MICROSCOPIC
Bilirubin Urine: NEGATIVE
Hgb urine dipstick: NEGATIVE
Specific Gravity, Urine: 1.007 (ref 1.005–1.030)
Urobilinogen, UA: 0.2 mg/dL (ref 0.0–1.0)
pH: 7.5 (ref 5.0–8.0)

## 2010-06-13 LAB — POCT CARDIAC MARKERS: Myoglobin, poc: 187 ng/mL (ref 12–200)

## 2010-06-13 LAB — DIFFERENTIAL
Lymphs Abs: 2.1 10*3/uL (ref 0.7–4.0)
Monocytes Absolute: 0.5 10*3/uL (ref 0.1–1.0)
Monocytes Relative: 7 % (ref 3–12)
Neutro Abs: 3.4 10*3/uL (ref 1.7–7.7)
Neutrophils Relative %: 53 % (ref 43–77)

## 2010-06-13 LAB — HEMOCCULT GUIAC POC 1CARD (OFFICE): Fecal Occult Bld: NEGATIVE

## 2010-06-13 LAB — LACTIC ACID, PLASMA: Lactic Acid, Venous: 0.9 mmol/L (ref 0.5–2.2)

## 2010-06-13 LAB — PROTIME-INR
INR: 2.75 — ABNORMAL HIGH (ref 0.00–1.49)
Prothrombin Time: 28.9 seconds — ABNORMAL HIGH (ref 11.6–15.2)

## 2010-06-13 LAB — COMPREHENSIVE METABOLIC PANEL
Albumin: 2.9 g/dL — ABNORMAL LOW (ref 3.5–5.2)
BUN: 13 mg/dL (ref 6–23)
Calcium: 8.4 mg/dL (ref 8.4–10.5)
Glucose, Bld: 96 mg/dL (ref 70–99)
Potassium: 4 mEq/L (ref 3.5–5.1)
Sodium: 139 mEq/L (ref 135–145)
Total Protein: 6.8 g/dL (ref 6.0–8.3)

## 2010-06-13 LAB — APTT: aPTT: 51 seconds — ABNORMAL HIGH (ref 24–37)

## 2010-06-14 LAB — URINALYSIS, ROUTINE W REFLEX MICROSCOPIC
Bilirubin Urine: NEGATIVE
Glucose, UA: NEGATIVE mg/dL
Hgb urine dipstick: NEGATIVE
Ketones, ur: NEGATIVE mg/dL
pH: 7 (ref 5.0–8.0)

## 2010-06-14 LAB — DIFFERENTIAL
Basophils Relative: 1 % (ref 0–1)
Eosinophils Absolute: 0.3 10*3/uL (ref 0.0–0.7)
Eosinophils Relative: 5 % (ref 0–5)
Lymphs Abs: 1.9 10*3/uL (ref 0.7–4.0)
Monocytes Absolute: 0.7 10*3/uL (ref 0.1–1.0)
Monocytes Relative: 9 % (ref 3–12)
Neutrophils Relative %: 61 % (ref 43–77)

## 2010-06-14 LAB — COMPREHENSIVE METABOLIC PANEL
ALT: 14 U/L (ref 0–53)
AST: 15 U/L (ref 0–37)
Albumin: 2.9 g/dL — ABNORMAL LOW (ref 3.5–5.2)
Alkaline Phosphatase: 78 U/L (ref 39–117)
Calcium: 8.6 mg/dL (ref 8.4–10.5)
GFR calc Af Amer: >60 mL/min (ref >60)
Glucose, Bld: 82 mg/dL (ref 70–99)
Potassium: 3.9 mEq/L (ref 3.5–5.1)
Sodium: 136 mEq/L (ref 135–145)
Total Protein: 6.8 g/dL (ref 6.0–8.3)

## 2010-06-14 LAB — CBC
Hemoglobin: 14.1 g/dL (ref 13.0–17.0)
MCHC: 33.4 g/dL (ref 30.0–36.0)
RDW: 14.3 % (ref 11.5–15.5)

## 2010-06-14 LAB — PROTIME-INR: Prothrombin Time: 27.3 seconds — ABNORMAL HIGH (ref 11.6–15.2)

## 2010-06-17 LAB — BASIC METABOLIC PANEL
CO2: 23 mEq/L (ref 19–32)
Calcium: 9.1 mg/dL (ref 8.4–10.5)
Chloride: 105 mEq/L (ref 96–112)
GFR calc Af Amer: >60 mL/min (ref >60)
Glucose, Bld: 101 mg/dL — ABNORMAL HIGH (ref 70–99)
Potassium: 3.4 mEq/L — ABNORMAL LOW (ref 3.5–5.1)
Sodium: 139 mEq/L (ref 135–145)

## 2010-06-17 LAB — CBC
HCT: 45.7 % (ref 39.0–52.0)
Hemoglobin: 15.9 g/dL (ref 13.0–17.0)
MCHC: 34.8 g/dL (ref 30.0–36.0)
RBC: 4.44 MIL/uL (ref 4.22–5.81)
RDW: 14.1 % (ref 11.5–15.5)

## 2010-06-17 LAB — DIFFERENTIAL
Basophils Relative: 2 % — ABNORMAL HIGH (ref 0–1)
Eosinophils Relative: 4 % (ref 0–5)
Monocytes Absolute: 0.5 10*3/uL (ref 0.1–1.0)
Monocytes Relative: 8 % (ref 3–12)
Neutro Abs: 3.2 10*3/uL (ref 1.7–7.7)

## 2010-06-17 LAB — POCT CARDIAC MARKERS
Myoglobin, poc: 119 ng/mL (ref 12–200)
Troponin i, poc: <0.05 ng/mL (ref 0.00–0.09)

## 2010-07-14 ENCOUNTER — Emergency Department (HOSPITAL_COMMUNITY): Payer: Medicare Other

## 2010-07-14 ENCOUNTER — Emergency Department (HOSPITAL_COMMUNITY)
Admission: EM | Admit: 2010-07-14 | Discharge: 2010-07-14 | Disposition: A | Discharge status: Home / Self Care | Payer: Medicare Other | Attending: Emergency Medicine | Admitting: Emergency Medicine

## 2010-07-14 DIAGNOSIS — K5641 Fecal impaction: Secondary | ICD-10-CM | POA: Insufficient documentation

## 2010-07-14 DIAGNOSIS — K219 Gastro-esophageal reflux disease without esophagitis: Secondary | ICD-10-CM | POA: Insufficient documentation

## 2010-07-14 DIAGNOSIS — Z79899 Other long term (current) drug therapy: Secondary | ICD-10-CM | POA: Insufficient documentation

## 2010-07-14 DIAGNOSIS — Z8673 Personal history of transient ischemic attack (TIA), and cerebral infarction without residual deficits: Secondary | ICD-10-CM | POA: Insufficient documentation

## 2010-07-14 DIAGNOSIS — I2510 Atherosclerotic heart disease of native coronary artery without angina pectoris: Secondary | ICD-10-CM | POA: Insufficient documentation

## 2010-07-14 DIAGNOSIS — K5900 Constipation, unspecified: Secondary | ICD-10-CM | POA: Insufficient documentation

## 2010-07-14 DIAGNOSIS — R1909 Other intra-abdominal and pelvic swelling, mass and lump: Secondary | ICD-10-CM | POA: Insufficient documentation

## 2010-07-14 DIAGNOSIS — F068 Other specified mental disorders due to known physiological condition: Secondary | ICD-10-CM | POA: Insufficient documentation

## 2010-07-14 DIAGNOSIS — Z86718 Personal history of other venous thrombosis and embolism: Secondary | ICD-10-CM | POA: Insufficient documentation

## 2010-07-14 DIAGNOSIS — R109 Unspecified abdominal pain: Secondary | ICD-10-CM | POA: Insufficient documentation

## 2010-07-14 DIAGNOSIS — R339 Retention of urine, unspecified: Secondary | ICD-10-CM | POA: Insufficient documentation

## 2010-07-14 DIAGNOSIS — N400 Enlarged prostate without lower urinary tract symptoms: Secondary | ICD-10-CM | POA: Insufficient documentation

## 2010-07-14 DIAGNOSIS — E785 Hyperlipidemia, unspecified: Secondary | ICD-10-CM | POA: Insufficient documentation

## 2010-07-14 LAB — COMPREHENSIVE METABOLIC PANEL
ALT: 23 U/L (ref 0–53)
AST: 35 U/L (ref 0–37)
Albumin: 3.1 g/dL — ABNORMAL LOW (ref 3.5–5.2)
BUN: 16 mg/dL (ref 6–23)
GFR calc Af Amer: >60 mL/min (ref >60)
GFR calc non Af Amer: >60 mL/min (ref >60)
Potassium: 3.6 mEq/L (ref 3.5–5.1)
Sodium: 141 mEq/L (ref 135–145)
Total Bilirubin: 0.4 mg/dL (ref 0.3–1.2)
Total Protein: 6.5 g/dL (ref 6.0–8.3)

## 2010-07-14 LAB — DIFFERENTIAL
Basophils Absolute: 0 10*3/uL (ref 0.0–0.1)
Basophils Relative: 0 % (ref 0–1)
Eosinophils Absolute: 0.2 10*3/uL (ref 0.0–0.7)
Eosinophils Relative: 4 % (ref 0–5)
Neutrophils Relative %: 62 % (ref 43–77)

## 2010-07-14 LAB — CBC
Platelets: 209 10*3/uL (ref 150–400)
RDW: 13.2 % (ref 11.5–15.5)
WBC: 6.4 10*3/uL (ref 4.0–10.5)

## 2010-07-14 LAB — URINALYSIS, ROUTINE W REFLEX MICROSCOPIC
Ketones, ur: NEGATIVE mg/dL
Nitrite: NEGATIVE
Protein, ur: NEGATIVE mg/dL

## 2010-07-26 NOTE — Assessment & Plan Note (Signed)
[facility]                            CARDIOLOGY OFFICE NOTE   Jason [name], Jason [name]                         MRN:          [id number]  DATE:01/14/2008                            DOB:          Aug 02, 1919    REASON FOR VISIT:  Coronary artery disease.   HISTORY OF PRESENT ILLNESS:  The patient is an 77 year old gentleman  with coronary artery disease who underwent bare-metal stenting of his  left circumflex over 1 year ago.  He also has a history of a DVT and  pulmonary embolus and is on chronic Coumadin.  He presents today for  routine followup.  He has no recent episodes of chest pain, dyspnea,  orthopnea, or PND.  His main complaint involves his legs.  He complains  of swelling in his right leg, but his main complaint is that of pain,  numbness, and tingling in his lower legs and toes.  He has a difficult  time walking secondary to pain and gait instability.  He denies calf or  thigh pain.  He has had no ulcerations or nonhealing wounds.  Both legs  are affected equally.  His pain has slowly progressed over the last  several months.  He also complains of chronic dizziness and his symptoms  are improved with meclizine.   MEDICATIONS:  1. Flomax 0.4 mg at bedtime.  2. Allopurinol 100 mg daily.  3. Coumadin 6 mg daily and 3 mg on Wednesdays.  4. Zegerid 40 mg at bedtime.  5. Temazepam as needed.  6. Lyrica 50 mg daily.  7. Vitamin D 1000 mg daily.   ALLERGIES:  NKDA, but the patient is intolerant to a TRANSDERMAL  SCOPOLAMINE PATCH and FLUTICASONE.   PHYSICAL EXAMINATION:  GENERAL:  The patient is alert and oriented.  He  is in no acute distress.  He is an elderly gentleman.  VITAL SIGNS:  Weight is 189 pounds, blood pressure 150/90, heart rate  59, and respiratory rate 16.  HEENT:  Normal.  NECK:  Normal carotid upstrokes.  No bruits.  JVP normal.  LUNGS:  Clear bilaterally.  HEART:  Regular rate and rhythm.  No murmurs or gallops.  ABDOMEN:   Soft, nontender.  No organomegaly.  No abdominal bruits.  EXTREMITIES:  No clubbing or cyanosis.  The right leg has 1+ edema and  the left leg has no edema.  There are superficial varicosities present.  There are mild stasis changes predominantly on the right side.  Femoral  pulses are 2+ without bruits.  Posterior tibial pulses are 2-3+ and  dorsalis pedis pulses are 2+ on the left and 1+ on the right.  There are  no areas of skin breakdown.   EKG shows normal sinus rhythm and is within normal limits.   ASSESSMENT:  1. Coronary artery disease, status post bare-metal stenting of the      left circumflex.  The patient remains on Coumadin because of his      deep vein thrombosis and pulmonary embolism history.  We would      recommend withholding antiplatelet therapy in his  setting of his      advanced age and stable coronary artery disease.  He is having no      angina and requires no further workup at present.  2. Leg pain.  He clearly has good arterial flow to his feet with      bounding pulses.  I do not think his pain is related to peripheral      arterial disease.  His symptoms sound more neuropathic in origin.      The patient has seen Dr. Anne Hahn in the past for other problems and      I have recommended that they contact his office for an appointment      and potential further recommendations if he agrees with this.  This      may be related to a neuropathy.  3. Dyslipidemia.  He has been intolerant to multiple medications that      have been tried in the past.  We will hold off on treatment at      present.   For followup, I would like to see Mr. Chuba back in 12 months.     Veverly Fells. Excell Seltzer, MD  Electronically Signed    MDC/MedQ  DD: 01/14/2008  DT: 01/15/2008  Job #: [id number]   cc:   Marlan Palau, M.D.  Frederica Kuster, MD

## 2010-07-26 NOTE — Discharge Summary (Signed)
NAME:  Jason [name], Jason [name] NO.:  [id number]   MEDICAL RECORD NO.:  [id number]          PATIENT TYPE:  INP   LOCATION:  5036                         FACILITY:  [facility]   PHYSICIAN:  Barbette Hair. Artist Pais, DO      DATE OF BIRTH:  11/05/1919   DATE OF ADMISSION:  03/14/2007  DATE OF DISCHARGE:                               DISCHARGE SUMMARY   DISCHARGE DIAGNOSES:  1. Left lower extremity cellulitis.  2. History of right lower extremity deep venous thrombosis.  3. Hyperlipidemia.  4. Gastroesophageal reflux disease.  5. Benign prostatic hypertrophy.  6. History of migraine headache.   DISCHARGE MEDICATIONS:  1. Avelox 400 mg once daily for 10 days.  2. Zetia 10 mg once daily.  3. Flomax 0.4 mg once daily.  4. Allopurinol 100 mg once daily.  5. Lyrica 50 mg once daily.  6. Zegerid 40/1100 once daily.  7. Patient instructed to resume previous dose of Warfarin.  8. Resume Percocet as needed as previously prescribed by Dr.      Posey Rea.   FOLLOW UP INSTRUCTIONS:  Follow-up with Dr. Posey Rea within one week.  He is to call for an appointment.  Needs repeat INR in one week.   HOSPITAL COURSE:  Patient is an 77 year old white male with past medical  history of DVT who was admitted for cellulitis of his left lower  extremity.  Patient was on outpatient Keflex for several days without  improvement.  He noted refractory pain, swelling and erythema.  Patient's white count was in normal range, 7.7, with no shift.  His INR  was therapeutic at 2.5.  He ruled out possibility of recurrent DVT  despite therapeutic INR.  A lower extremity Doppler was done which was  negative for DVT or SVT.   CONDITION ON DISCHARGE:  Patient's left lower extremity was less tender.  The erythema was also slightly improved.  He was afebrile and considered  stable for discharge.   Patient advised to call his primary care physician if worsening left  lower extremity pain, redness or fever.      Barbette Hair. Artist Pais, DO  Electronically Signed     RDY/MEDQ  D:  03/17/2007  T:  03/17/2007  Job:  [id number]   cc:   Georgina Quint. Plotnikov, MD

## 2010-07-26 NOTE — Assessment & Plan Note (Signed)
[facility]                            CARDIOLOGY OFFICE NOTE   WANDA, CELLUCCI                         MRN:          [id number]  DATE:01/03/2007                            DOB:          Apr 27, 1919    Jason [name] was seen as an outpatient at the [facility] office on  January 03, 2007.  Jason [name] is a very nice 77 year old gentleman who  has coronary artery disease.  A bare-metal stent was placed in his left  circumflex 1 year ago.  He has been chronically anticoagulated with  Coumadin because of pulmonary emboli.  After his coronary intervention,  he had an episode of chest pain that prompted a Myoview study back in  December of last year.  His adenosine Myoview study showed normal  perfusion with preserved left ventricular ejection fraction of 58%.   Jason [name] scheduled this appointment because he became extremely ill  with the use of a scopolamine patch.  The scopolamine patch was  prescribed for dizziness.  When using the patch, he developed severe  shortness of breath and nausea.  Now that he has been taken off the  patch and is using diazepam, he is feeling much better.  His symptoms  have completely resolved.  He specifically denies chest pain, dyspnea,  orthopnea, PND, exertional symptoms, edema, or palpitations.   CURRENT MEDICATIONS:  1. Flomax 0.4 mg daily.  2. Allopurinol 100 mg daily.  3. Coumadin as directed.  4. Temazepam as needed.  5. Zetia 10 mg at bedtime.  6. Zegerid 40 mg before dinner.  7. Vitamin D.  8. Lyrica 50 mg 2 daily.  9. Diazepam as needed.   ALLERGIES:  No known drug allergies.   PHYSICAL EXAMINATION:  He is an elderly male in no acute distress,  moderately hard of hearing.  Weight 193, blood pressure 130/88, heart rate 64, respiratory rate 16.  HEENT:  Normal.  NECK:  Normal carotid upstrokes without bruits.  There is a well-healed  right carotid endarterectomy scar.  Jugular venous pressure is  normal.  Thyroid is normal without nodules or thyromegaly.  LUNGS:  Clear to auscultation bilaterally.  HEART:  Regular rate and rhythm without murmurs or gallops.  ABDOMEN:  Soft, nontender, no organomegaly, no abdominal bruits.  EXTREMITIES:  No clubbing or cyanosis, there is 1+ edema on the right  and trace edema on the left.  Peripheral pulses are intact and equal.   EKG is normal with a heart rate of 64 and no significant ST or T-wave  changes.   Lipids were last checked in December of 2007 and cholesterol was 142,  triglycerides 70, HDL 38, LDL 90.   ASSESSMENT:  1. Coronary artery disease.  Jason [name] is stable with no cardiac      symptoms and no angina.  He is not currently on antiplatelet      therapy, and I think with his advanced age and stable disease, now      1 year out from bare-metal stenting.  It is fine to just continue  him on Coumadin.  He has been intolerant to multiple medications,      and I am not inclined to start him on a beta blocker at this point      in the setting of being angina free with normal blood pressure.  2. Dyslipidemia.  He has been intolerant to statins.  He does need a      lipid followup, and I believe his last lipid panel was drawn with      him on statin therapy.  He is currently tolerating Zetia well and      will continue him on this medication.  He is scheduled for lipids      and liver function tests to be drawn.  3. Followup.  Jason [name] sees Dr. Posey Rea regularly.  I am going to      schedule him for a 1-year followup, but I would be happy to see him      in the interim if he develops any problems.     Veverly Fells. Excell Seltzer, MD  Electronically Signed    MDC/MedQ  DD: 01/03/2007  DT: 01/04/2007  Job #: [id number]   cc:   Georgina Quint. Plotnikov, MD

## 2010-07-26 NOTE — H&P (Signed)
NAME:  Jason [name], SALAY NO.:  [id number]   MEDICAL RECORD NO.:  [id number]          PATIENT TYPE:  INP   LOCATION:  5036                         FACILITY:  [facility]   PHYSICIAN:  Hollice Espy, M.D.DATE OF BIRTH:  1919/05/15   DATE OF ADMISSION:  03/15/2007  DATE OF DISCHARGE:                              HISTORY & PHYSICAL   PRIMARY CARE PHYSICIAN:  Georgina Quint. Plotnikov, MD   CHIEF COMPLAINT:  Leg swelling and redness.   HISTORY OF PRESENT ILLNESS:  The patient is an 77 year old white male  with past medical history of DVT, migraine headaches and recent  cellulitis, who has been treated on outpatient Keflex for several days,  but his leg pain, swelling and erythema continued to worsen to the point  where he could not take anymore and came into the emergency room.  On  his left leg, he was noted to have the medial aspect near his knee,  noted to have a markedly, about a 3 cm radius round area of erythema  with streaks consistent with cellulitis.  Labs were drawn.  The patient  had a white count which was normal in the normal range of 7.7 with no  shift.  However, he had been on Keflex as an outpatient.  His INR was  noted to be therapeutic at 2.5 of which he is on Coumadin normally for.  Previous history of a DVT.  Because he had him put on Keflex but  continued to have worsening redness and pain and swelling, it was felt  best he come in for further treatment.  Currently, he his doing well.  He has medication for pain.  He denies any headaches, vision changes,  dysphagia, chest pain, palpitations, shortness of breath, wheezing or  cough, abdominal pain.  On hematuria or dysuria or constipation or  diarrhea.  Currently, he is having no focal extremity numbness, weakness  or pain.   REVIEW OF SYSTEMS:  Otherwise, negative.   PAST MEDICAL HISTORY:  1. GERD.  2. Hyperlipidemia.  3. BPH.  4. Gout.  5. History of DVT.  6. Migraine headaches.    MEDICATIONS:  1. Zetia 10.  2. Nitro 0.4 p.o. daily p.r.n.  3. Flomax 0.4 daily.  4. Allopurinol 100 daily.  5. Coumadin 3 mg on Wednesdays and Saturdays, 6 mg the rest of the      days, q.h.s. p.r.n.  6. Lyrica 50 mg p.o. daily.  7. Percocet 5/325 p.o. q.8 h p.r.n.  8. Tramadol 50 q.h.s. p.r.n.   ALLERGIES:  No known drug allergies.   SOCIAL HISTORY:  No tobacco, alcohol or drug use.   FAMILY HISTORY:  Noncontributory.   PHYSICAL EXAMINATION:  VITAL SIGNS:  Temperature 100, heart rate 71,  blood pressure 142/82, respirations 18, O2 saturation 96% on room air.  GENERAL:  Alert and oriented x3.  No apparent distress.  HEENT:  Normocephalic, atraumatic.  Mucous membranes moist.  No carotid  bruits.  HEART:  Regular rate and rhythm.  S1, S2.  LUNGS:  Clear to auscultation bilaterally.  ABDOMEN:  Soft, nontender, nondistended,  positive bowel sounds.  EXTREMITIES:  On his left leg and on the medial aspect near his knee, he  has a large 4 cm in radius lesion that has some induration and erythema  to this.  Hence, suspect cellulitis.   LABORATORY DATA:  White count 7.7, H&H 14.8 and 44, MCV 102, platelet  count 197, no shift.  INR 2.5. Sodium 137, potassium 4.7, chloride 106,  bicarbonate 27, BUN 16, creatinine 1.4, glucose 101.   ASSESSMENT/PLAN:  1. Cellulitis.  Failed outpatient therapy.  Will try IV      fluoroquinolone such as Avelox.  2. History of deep vein thrombosis.  Coumadin is therapeutic.  Will      continue.  3. History of coronary artery disease which is stable.  4. History of benign prostatic hypertrophy.  Continue Flomax.      Hollice Espy, M.D.  Electronically Signed     SKK/MEDQ  D:  03/15/2007  T:  03/15/2007  Job:  [id number]   cc:   Georgina Quint. Plotnikov, MD

## 2010-07-26 NOTE — Assessment & Plan Note (Signed)
[facility]                         GASTROENTEROLOGY OFFICE NOTE   RADFORD, PEASE                         MRN:          [id number]  DATE:05/28/2007                            DOB:          [birth year]    CHIEF COMPLAINT:  Epigastric pain, refill meds.   Jason [name] has been starting to have some postprandial fullness at times.  His abdominal girth has increased some and he has also had some  peripheral edema.  I am unsure of the exact time course, but I think the  increasing abdominal girth has been a problem over the last month or so.  He has had some severe postprandial epigastric pain on occasion.  He has  not been vomiting.  He has not had anything like he was hospitalized  before with his small bowel obstruction, however.  Most of the time, his  bowel movements are regular, without difficulty, though there is some  occasional constipation.  When he went into the gap with his  prescription coverage, he did use omeprazole for a while and feels like  Zegerid is better and he went back to that and is asking for a refill.  He notes that, when he leans to the left while defecating, he can  produce a bowel movement more easily.  He is not describing any cardiac  or pulmonary problems at this time.   REVIEW OF SYSTEMS:  Negative.   MEDICATIONS:  Are listed and reviewed in the chart.  He is on Lyrica,  temazepam p.r.n., Zegerid, Coumadin, allopurinol, Flomax and Zetia, as  well as p.r.n. nitroglycerin, tramadol and hydrocodone.   PAST MEDICAL HISTORY:  Reviewed and unchanged from previous list.  My  most recent list was February of 2008.   PHYSICAL EXAM:  Shows him to be in no acute distress at this time.  He  looks robust for a man of 77 years of age.  His weight is 193 pounds, which is actually similar to where he was in  2008, when he saw Dr. Excell Seltzer (October).  In 2007, he was 181 pounds.  Pulse is 60, blood pressure is 122/78.  LUNGS:  Clear.  There is no jugular venous distention.  HEART:  S1, S2.  He appears to be in a normal sinus rhythm at this time.  There is 1+ bilateral lower extremity edema.  There is no cyanosis or  clubbing of the digits.  The abdomen is perhaps a little distended.  There could be ascites,  though there is no clear-cut fluid wave.  It is soft and nontender.  Bowel sounds are present.  There is no organomegaly or mass.  I do not  hear a bruit.  NEUROLOGIC:  Cranial nerves II through XII appear intact, though he is  hard of hearing.  He has appropriate mood and affect.   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  Question  of this is gallbladder.  Question if he has ascites problems.  He is  describing some postprandial fullness,  so maybe he is just over-eating.  It does not really look like he has gained weight.  However, in an  elderly man like this, it can be hard to tell whether or not there are  more serious problems.   PLAN:  1. Ultrasound of the abdomen.  This will help look for ascites, which      could be possible, would help look for gallstones.  2. CBC, CMET, amylase and lipase.  3. Refill the Zegerid.  4. We will contact him when the results are in.  He may need an      antispasmodic.  I prescribed that in the past, though he is not      using one.  If this exam is negative, I would probably not work it      up further and to try to treat him symptomatically.     Iva Boop, MD,FACG  Electronically Signed    CEG/MedQ  DD: 05/28/2007  DT: 05/29/2007  Job #: [id number]   cc:   Georgina Quint. Plotnikov, MD

## 2010-07-26 NOTE — Assessment & Plan Note (Signed)
[facility]                            CARDIOLOGY OFFICE NOTE   TREVEN, HOLTMAN                         MRN:          [id number]  DATE:10/04/2007                            DOB:          Apr 04, 1919    [name] is a pleasant 77 year old patient of Dr. Tonny Bollman who  has a history of a bare-metal stent to his circumflex artery.  He also  has a history of pulmonary embolus and is on chronic Coumadin.  He also  has a history of reflux, esophageal stricture, hiatal hernia, and small  bowel obstruction.  There is a history of incomplete  rotation/malrotation of the jejunum.  The patient presented to the  C[facility]madin Clinic today and I was asked to see him.  He apparently woke  this morning and felt fine with no symptoms.  He had breakfast and while  walking into the Coumadin Clinic, he developed a sensation of nausea,  diaphoresis, weakness, and mild abdominal pain.  He denied any chest  pain, shortness of breath, palpitations, or syncope.  Apparently, he has  had these episodes intermittently for a long time.  He is seen by Dr.  Leone Payor for these issues.  Because of the above, we were asked to  evaluate.  Of note, he is not having fevers or chills.  His last bowel  movement was yesterday.  He has been eating normally.   PHYSICAL EXAM:  VITAL SIGNS:  Blood pressure 142/80, his pulse is 76,  his respiratory rate was 17, his temperature was 96.8.  His blood sugar  was 133.  GENERAL:  He is well developed and well nourished in no acute distress.  HEENT:  Normal.  NECK:  Supple.  CHEST:  Clear.  CARDIOVASCULAR:  Regular rate and rhythm.  ABDOMEN:  Shows minimal tenderness to palpation, but there is no rebound  or guarding.  EXTREMITIES:  Show no edema.   His electrocardiogram shows a sinus rhythm at a rate of 69.  The axis is  normal.  There are no ST changes noted.  His INR today is 2.3.   DIAGNOSES:  1. Episode of dizziness, nausea, diaphoresis -  etiology of this      remains unclear.  However, it appears to be a chronic issue and      occurring intermittently.  I have asked him to increase his Zegerid      to twice a day for 5 days and to resume once daily.  I have also      asked him to follow up with Dr. Leone Payor concerning this issue.  I      do not find any problems that are acute that would require further      evaluation at this point.  2. Coronary artery disease - he will continue on his present      medications and follow up with Dr. Excell Seltzer concerning this.  3. History of pulmonary embolus - he will continue on his Coumadin and      this is being monitored in our Coumadin Clinic.  4. History of  small bowel obstruction/incomplete rotation/malrotation      of the jejunum.    Madolyn Frieze Jens Som, MD, [facility]  Electronically Signed   BSC/MedQ  DD: 10/04/2007  DT: 10/05/2007  Job #: [id number]

## 2010-07-29 NOTE — Discharge Summary (Signed)
NAME:  JOSUA, FERREBEE NO.:  [id number]   MEDICAL RECORD NO.:  [id number]           PATIENT TYPE:   LOCATION:                               FACILITY:  [facility]   PHYSICIAN:  Valerie A. Felicity Coyer, MD     DATE OF BIRTH:   DATE OF ADMISSION:  DATE OF DISCHARGE:                               DISCHARGE SUMMARY   DIAGNOSIS AT THE TIME OF DISCHARGE:  1. Left-sided chest pain with shortness of breath and near syncope.  2. Bradycardia.   HISTORY OF PRESENT ILLNESS:  Mr. Gilham is an 77 year old male who  presented with complaints of feeling unwell, weak, and near syncope.  The patient also had an episode of chest pain which was left-sided  associated with tightness and shortness of breath.  He was started on  diltiazem 1 week prior to admission.   PAST MEDICAL HISTORY:  1. CVA status post right carotid endarterectomy.  2. BPH.  3. GERD, esophageal stricture status post dilation.  4. Degenerative joint disease.  5. Anxiety.  6. Hyperlipidemia.  7. History of PE.   COURSE OF HOSPITALIZATION:  Left-sided chest pain with shortness of  breath and near syncope.  The patient underwent serial cardiac enzymes,  which were negative.  A cardiac consult was requested.  The patient was  seen in consultation by Dr. Nicholes Mango with our cardiology.  He  thought that chest pain was unlikely to be ischemic but felt that he was  at risk for angina.  He recommended the patient to be discharged to home  for outpatient stress test on the day after discharge and plans for an  event monitor.  It was also recommended that diltiazem be discontinued.  He was also noted to have some bradycardia with rate down into the 40s.  During this admission, Cardizem was held.   MEDICATIONS AT THE TIME OF DISCHARGE:  1. Simvastatin 40 mg p.o. daily.  2. Baby aspirin 81 mg p.o. daily.  3. Other medicines as prior to admission, including Coumadin, Flomax,      and allopurinol.  The patient is instructed  to stop diltiazem.   PERTINENT LABORATORY AT THE TIME OF DISCHARGE:  Cardiac enzymes are  negative.  Total cholesterol 229, HDL 45, LDL 170, INR of 3.2.   DISPOSITION:  The patient will be transferred to home.  He is instructed  to followup with Dr. Antoine Poche on November 9 at 12 noon and to follow up  with [facility] on October 26 for an adenosine Myoview stress  test and to start a two-week event monitor.      Sandford Craze, NP      Raenette Rover. Felicity Coyer, MD  Electronically Signed    MO/MEDQ  D:  02/22/2006  T:  02/23/2006  Job:  [id number]

## 2010-07-29 NOTE — Assessment & Plan Note (Signed)
[facility]                            CARDIOLOGY OFFICE NOTE   GRIFF, BADLEY                         MRN:          [id number]  DATE:02/21/2006                            DOB:          1919-09-28    HISTORY:  Mr. Narang is an 77 year old male, well known to me, with  coronary artery disease.  He underwent a percutaneous transluminal  coronary angiography and bare metal stenting of his left circumflex back  in October of this year.  At that time I placed a bare metal stent,  secondary to his advanced age and need for chronic Coumadin therapy,  secondary to pulmonary emboli.  He has completed his 30-day course of  Clopidogrel and is currently taking aspirin and Coumadin.  He was seen  last week in the clinic by Dr. Maisie Fus C. Wall and at that time was  complaining of chest discomfort at night.  An adenosine Myoview stress  study was performed and demonstrated normal myocardial perfusion, as  well as a normal left ventricular ejection fraction of 58%.   Mr. Schue continues to have chest pain at night.  He describes  consistent pain that occurs after eating dinner.  It generally is worse  with lying supine.  The discomfort starts in his upper abdomen and  radiates up to the chest, left shoulder and left arm.  He describes some  tingling in the fingers of his left hand.  This pain has occurred on a  nightly basis, but is somewhat improved after increasing his Prilosec  from daily dosing to twice daily dosing.  Mr. Gladu has continued to be  active.  He swims for exercise on a regular basis.  He swims somewhat  between 20 and 45 minutes, and has no symptoms with exertion.  He  specifically denies chest pain or dyspnea with exertion.  He has no  other complaints at this time.   CURRENT MEDICATIONS:  1. Flomax 0.4 mg daily.  2. Coumadin daily.  3. Aspirin 81 mg daily.  4. Allopurinol 100 mg daily.  5. Prilosec 20 mg b.i.d.   ALLERGIES:   HYOSCYAMINE.   PHYSICAL EXAMINATION:  GENERAL:  The patient is alert and oriented.  He  is in no acute distress.  VITAL SIGNS:  Weight 179 pounds, blood pressure 127/75, heart rate 62,  respirations 12.  HEENT:  Normal.  NECK:  Right carotid endarterectomy scar present.  Normal carotid  upstrokes without bruits.  Jugular venous pressure normal.  LUNGS:  Clear to auscultation bilaterally.  CARDIOVASCULAR:  Apex discrete, non-displaced.  Heart has a regular rate  and rhythm without murmurs or gallops.  ABDOMEN:  Soft, nontender.  No organomegaly, no abdominal bruits.  EXTREMITIES:  No clubbing, cyanosis or edema.  Peripheral pulses 2+ and  equal throughout.   Electrocardiogram was reviewed from his office visit on February 16, 2006, which demonstrated a normal sinus rhythm and was within normal  limits.   ASSESSMENT/RECOMMENDATIONS:  Mr. Manna is currently stable from a  cardiovascular standpoint with regard to his coronary artery disease.  His current  problems are as follows:  1. Coronary artery disease:  His pain is certainly atypical, and I      think there is very little likelihood that it is related to      obstructive coronary disease.  He should continue on a daily      aspirin.  His adenosine Myoview study demonstrates no ischemia.  He      has had some difficulty with medication tolerance, and I have opted      not to start him on beta blockade in the setting of his normal left      ventricular function.  He had no symptoms suggestive of ischemia.  2. Chest pain:  His symptoms are highly suggestive of reflux.  I      understand that he is going to have an evaluation in      gastroenterology later today.  3. Dyslipidemia:  The patient has not tolerated Lipitor or Zocor.  I      would elect to hold off on lipid therapy at this point.  He clearly      will gain some long-term benefit from lipid therapy; however, I      think it would be reasonable to sort out his current  symptomatic      problems and then consider initiation of Zetia or another non-      statin medication in the future.   FOLLOWUP:  I will plan on seeing Mr. Ridings back in three months for his  regular followup.  If any new problems arise, I would be happy to see  him sooner.     Veverly Fells. Excell Seltzer, MD  Electronically Signed    MDC/MedQ  DD: 02/21/2006  DT: 02/21/2006  Job #: [id number]   cc:   Georgina Quint. Plotnikov, MD

## 2010-07-29 NOTE — Discharge Summary (Signed)
NAME:  Jason [name], Jason [name] NO.:  [id number]   MEDICAL RECORD NO.:  [id number]          PATIENT TYPE:  INP   LOCATION:  5028                         FACILITY:  [facility]   PHYSICIAN:  Rene Paci, M.D. LHCDATE OF BIRTH:  May 09, 1919   DATE OF ADMISSION:  06/05/2004  DATE OF DISCHARGE:  06/07/2004                                 DISCHARGE SUMMARY   DISCHARGE DIAGNOSES:  1.  Dysequilibrium.  2.  Orthostatic hypotension possibly secondary to Flomax.  3.  Labyrinthitis versus vertigo.   HISTORY OF PRESENT ILLNESS:  Mr. Daughety is a 77 year old white male   Dictation ended at this point      LC/MEDQ  D:  06/07/2004  T:  06/07/2004  Job:  [id number]   cc:   Georgina Quint. Plotnikov, M.D. [facility]

## 2010-07-29 NOTE — Assessment & Plan Note (Signed)
[facility]                                 ON-CALL NOTE   Jason [name], Jason [name]                         MRN:          [id number]  DATE:04/03/2006                            DOB:          1919/10/11    CALLER:  The patient's daughter, Jason [name]   NUMBER:  [phone]   DATE AND TIME OF CALL:  April 03, 2006, at 7:35 a.m.   I was called this morning by Jason [name] on behalf of her father, Jason [name].  She reports that he has seen Dr. Leone Payor in the past for abdominal  complaints and is on Prilosec.  She received a call from her father this  morning stating that his stomach was bothering him all night.  She can  provide no further information, as her father does not live with her.  She was planning on driving over to her father's house this morning.  I  advised her that our office opens  around 8:00 am and that after going  over to her father's home it would be advisable to contact the office,  at which point she could give a little additional information to the  triage nurse.Thereafter, Dr. Leone Payor might be available for assistance  with Jason [name], who he is familiar with.  She understood and was  appreciative.     Jason Bonito. Marina Goodell, MD  Electronically Signed    JNP/MedQ  DD: 04/03/2006  DT: 04/03/2006  Job #: [id number]   cc:   Iva Boop, MD,FACG

## 2010-07-29 NOTE — Assessment & Plan Note (Signed)
[facility]                                 ON-CALL NOTE   RICHARDS, PHERIGO                         MRN:          [id number]  DATE:04/22/2006                            DOB:          Oct 16, 1919    Phone is [phone].   Wife calling because the husband has severe pain and blood in his urine.  Advised to take him to the emergency room for evaluation.     Jeffrey A. Tawanna Cooler, MD  Electronically Signed    JAT/MedQ  DD: 04/22/2006  DT: 04/22/2006  Job #: [id number]

## 2010-07-29 NOTE — Assessment & Plan Note (Signed)
[facility]                                   ON-CALL NOTE   Jason [name], Jason [name]                         MRN:          [id number]  DATE:01/28/2006                            DOB:          12-Sep-1919    CARDIOLOGY SERVICE CALL NOTE:   CARDIOLOGIST:  Dr. Tonny Bollman, phone [phone].   HISTORY:  Jason [name] is a 77 year old male patient who was recently at [facility] for unstable angina.  He underwent stenting with a bare metal  stent to the circumflex.  He had nonobstructive disease in the LAD and RCA.  His EF was 50-55%.  He has a history of pulmonary emboli and is on chronic  Coumadin.  He also has a history of TIAs and is status post right carotid  endarterectomy.  The patient noted some chest tightness last night around  3:30.  He took three nitroglycerin that made it better.  Associated with  this he also had left arm and left leg numbness and weakness as well as some  unusual sensations in his face.  He denied any symptoms consistent with  amaurosis fugax.  He denied any speech difficulties.  He went to sleep and  this morning he awoke and was still having some symptoms and called his  daughter.   PLAN:  I am concerned about 2 things.  First of all, he has had a recent  intervention and has had recurrent chest pain.  He is also having symptoms  of left sided weakness and numbness.  He is certainly at risk for stroke.  He is on Coumadin therapy.  I think he needs further evaluation in the  emergency room.   DISPOSITION:  The patient will be evaluated in the emergency room.  Then our  service should be contacted to come and see him for his chest pain.      Tereso Newcomer, PA-C  Electronically Signed      Gerrit Friends. Dietrich Pates, MD, [facility]  Electronically Signed   SW/MedQ  DD: 01/28/2006  DT: 01/28/2006  Job #: [id number]

## 2010-07-29 NOTE — Discharge Summary (Signed)
NAME:  Jason [name], Jason [name] NO.:  [id number]   MEDICAL RECORD NO.:  [id number]          PATIENT TYPE:  INP   LOCATION:  4738                         FACILITY:  [facility]   PHYSICIAN:  Rene Paci, M.D. LHCDATE OF BIRTH:  [birth year]   DATE OF ADMISSION:  07/14/2005  DATE OF DISCHARGE:  07/24/2005                                 DISCHARGE SUMMARY   DISCHARGE DIAGNOSES:  1.  Bilateral pulmonary embolus and right lower extremity deep vein      thrombosis.  2.  Acute gout exacerbation.  3.  Left first finger cellulitis versus gout.   HISTORY OF PRESENT ILLNESS:  The patient is an 77 year old male who was  admitted on Jul 14, 2005, with chief complaint of leg pain and shortness of  breath.  The patient reported a 2 day history of sudden onset, very painful,  right lower extremity pain, swelling and redness below the knee.  This was  accompanied by some shortness of breath.  The patient denied fever or other  symptoms during this time.  He was admitted for further evaluation.   PAST MEDICAL HISTORY:  1.  History of CVA.  Currently off Aggrenox secondary to expense.  2.  BPH.  3.  Bilateral hydroceles.  4.  GERD with esophageal stricture status post dilation 2004.  5.  Peripheral vascular disease status post right carotid endarterectomy,      Dr. Madilyn Fireman.  6.  Degenerative joint disease.  7.  History of coronary artery disease status post PTCA x3, last 5 years ago      per Dr. Sw[place] of Cardiology.  8.  Hypercholesterolemia.  9.  Anxiety.   COURSE OF HOSPITALIZATION:  Problem #1:  Bilateral PE and right lower  extremity DVT.  The patient was admitted and he underwent venous Doppler  which was positive for a right lower extremity DVT.  He also underwent a CT  of the chest which showed a bilateral PE right greater than left.  The  patient was treated with a Lovenox bridge and Coumadin.  Coumadin became  therapeutic and after 48 hours overlap Lovenox was dc'd.  The patient  denies  any shortness of breath at this time.  His oxygen saturation is stable at  94% on room air.  A followup appointment has been scheduled for the patient  in the Coumadin Clinic next week.  He will be continued on Coumadin 6 mg  p.o. daily but will need close outpatient monitoring of PT, INR.   Problem #2:  Acute gout exacerbation.  The patient complained of severe  right great toe pain as well as left leg pain.  He was treated with  colchicine and p.o. prednisone with resolution of symptoms.  He will be  tapered on a prednisone taper upon discharge.  Of note, patient also had  what appeared to be right index finger cellulitis on admission and was  treated with p.o. Keflex.  In hindsight, this may have actually been gout in  his finger and it has resolved.   The patient initially did very poorly with physical  therapy during acute  gout exacerbations and it was felt that he may need a short term skilled  nursing facility vs. [facility] inpatient rehab.  However, after treatment for  gout patient is currently at his baseline, but family does not feel he needs  PT at this time.  Plan to transfer patient back to the assisted living  facility today.   MEDICATIONS AT DISCHARGE:  1.  Prednisone 30 mg p.o. on May 15 and Jul 26, 2005, then 20 mg p.o. on May      17 and Jul 28, 2005, then 10 mg p.o. on May 19 and Jul 30, 2005, then      stop.  2.  Aspirin 81 mg p.o. daily.  3.  Flomax 0.4 mg p.o. daily.  4.  Prilosec OTC once daily.  5.  Coumadin 6 mg p.o. daily.   PHYSICAL EXAM:  BP 111/81, heart rate 59, respiratory rate 18, temperature  97.6, O2 sat 94% in room air.  In general, the patient is awake, alert,  moving all extremities.  CARDIOVASCULAR:  S1, S2 regular rate and rhythm.  LUNGS:  Are clear to auscultation bilaterally with no wheezes, rales or  rhonchi.  ABDOMEN:  Is soft, nontender, nondistended.  EXTREMITIES:  Show no peripheral edema.   PERTINENT LABORATORIES AT DISCHARGE:   Hemoglobin 14.5, hematocrit 42.1, INR  2.8.   DISPOSITION:  The patient will be transferred to assisted living currently  in stable condition.   FOLLOWUP:  The patient is scheduled to followup with Dr. Jacinta Shoe  on Thursday, Jul 27, 2005, at 11 a.m.  At this appointment he will need  repeat PT, INR and adjustment as needed of Coumadin.  He is also scheduled  for the Coumadin clinic on Jul 31, 2005, at noon.      Melissa S. Peggyann Juba, NP      Rene Paci, M.D. [facility]  Electronically Signed    MSO/MEDQ  D:  07/24/2005  T:  07/24/2005  Job:  [id number]   cc:   Georgina Quint. Plotnikov, M.D. [facility]  [address]  [place]  [place] [address]

## 2010-07-29 NOTE — Discharge Summary (Signed)
NAME:  Jason, [name] NO.:  [id number]   MEDICAL RECORD NO.:  [id number]          PATIENT TYPE:  INP   LOCATION:  6522                         FACILITY:  [facility]   PHYSICIAN:  Veverly Fells. Excell Seltzer, MD  DATE OF BIRTH:  January 16, 1920   DATE OF ADMISSION:  01/07/2006  DATE OF DISCHARGE:  01/10/2006                                 DISCHARGE SUMMARY   DISCHARGE DIAGNOSES:  1. Acute coronary syndrome.  2. Coronary artery disease, status post bare metal stenting to the mid-      circumflex coronary artery.  3. Hyperlipidemia.  4. Abdominal discomfort, of uncertain etiology.  5. History of pulmonary embolism, with sub-therapeutic INR on admission.  6. Hypokalemia.  7. History as noted below.   PROCEDURES PERFORMED:  Cardiac catheterization with bare metal stenting to  the mid-circumflex coronary artery on January 09, 2006.   HISTORY:  Jason [name] is an 77 year old male who recently underwent a stress  test for recent onset of chest discomfort, who presented with a two to three-  week history of tachy-palpitations, associated with hypertension.  He was  admitted for a 23-hour observation, with arrangements for an outpatient  stress test approximately three days ago; however, the patient, according to  the daughter, has not heard any results.  It was also noted that he had an  event monitor placed for the preceding three days.  He presents with  recurring chest tightness, associated with left upper quadrant abdominal  discomfort, as well as left arm and left shoulder numbness plus nausea.   PAST MEDICAL HISTORY:  1. Notable for a CVA with a right carotid endarterectomy.  2. Benign prostatic hypertrophy.  3. Gastroesophageal reflux disease with esophageal stricture and      dilatation.  4. Degenerative joint disease.  5. Known coronary artery disease, with prior angioplasty.  The details are      not available.  Followed by  Dr. Peter M. Swaziland.  1. Anxiety.  2.  Hyperlipidemia.  3. Pulmonary embolism in May 2007, for which he is on Coumadin.  Last      echocardiogram in March 2006, showed an ejection fraction of 55%-65%.   ALLERGIES:  HYOSCYAMINE.   LABORATORY DATA:  A chest x-ray on January 07, 2006, revealed no active  disease.  An abdominal CT scan did not show any acute findings.  He has  bilateral non-obstructed nephrolithiasis.  A CT of the pelvis was  unremarkable.   Admission weight was 180.2 pounds.  Admission H&H was 15.3 and 43.6  respectively, normal indices, platelets 185, WBCs 5.9.  Prior to discharge  the H&H was 14.6 and 41.7 respectively, normal indices, platelets 177, WBCs  5.  Admission PT was 20.5 with an INR that was sub-therapeutic at 1.7.  At  the time of discharge the PT was 16 with an INR of 1.2.  Admission sodium  141, potassium 3.6, BUN 15, creatinine 1.1, glucose 95.  Subsequent  chemistry with liver function tests on January 09, 2006, was essentially  unremarkable.  Prior to discharge the sodium was 140, potassium 3.9,  BUN 9,  creatinine 1.1 and glucose 85.  Lipase on admission was 28.  CK-MB, relative  index and troponins were within normal limits x3.  Beta natriuretic peptide  was less than 30.  Stools on January 08, 2006, were heme-negative.   Electrocardiograms throughout the admission showed sinus bradycardia, normal  sinus rhythm, normal axis, incomplete right bundle branch block.   HOSPITAL COURSE:  Jason [name] was admitted to [facility] and  was continued on his home medications.  He was started on IV heparin, as his  INR was sub-therapeutic at 1.7.  Dr. Bevelyn Buckles. Bensimhon saw him on the  morning of January 08, 2006, and he had ruled out for a myocardial  infarction.  An abdominal CT was performed to evaluate his continued left  lower quadrant discomfort.  His Coumadin was placed on hold, in anticipation  of possible cardiac catheterization for definitive evaluation.  The  catheterization  was performed on January 09, 2006, by Dr. Veverly Fells. Cooper.  Please refer to dictation.  He did have an ejection fraction of 50%-55%,  without evidence of mitral regurgitation.  He had a 90% mid-circumflex that  was treated with a bare metal stent, reducing this to 0%.  He had residual  non-obstructive disease in the RCA and LAD, and an 80% small diagonal-I  lesion.  They planned on continued medical treatment.  Post-sheath removal  he was placed on bedrest.  The catheterization site was intact.  The patient  was ambulating without difficulty.  It was noted that on his recent  admission, the fasting lipids showed a total cholesterol of 229 and  triglycerides of 71, HDL 45 and LDL 170 on January 04, 2006.  Thus a statin  was started.  It was felt that he could probably be discharged in the  morning.   DISPOSITION:  By the morning of January 10, 2006, he was doing well.  Dr.  Excell Seltzer felt that the patient could be discharged home.  He noted, that given  his history of a deep venous thrombosis and pulmonary embolism, he should be  on home Lovenox for bridging, until his INR is therapeutic.  Case management  assisted with arranging this.  The FL2 was signed and the patient was  discharged back to [facility].   DIET:  He is asked to maintain a low-salt, low-fat, low-cholesterol diet.   ACTIVITY/WOUND CARE:  Per supplemental discharge sheet.   DISCHARGE INSTRUCTIONS:  If he has any problems, he is asked to call us.  He  was asked to bring all medications to all appointments.   DISCHARGE MEDICATIONS:  1. His Coumadin was increased to 3 mg every day, except for 6 mg on      Monday, Wednesday and Saturday.  2. Prescription for Lipitor 80 mg p.o. q.h.s.  3. Lovenox 80 mg subcu q.12h. until his INR is greater than 2.  4. He was asked to continue aspirin 81 mg q.d.  5. Flomax 0.4 mg q.d.  6. Restoril 15 mg q.h.s. p.r.n. 7. Allopurinol 100 mg q.d.  8. Prilosec 20 mg q.d.  9. Nitroglycerin  0.4 mg p.r.n. chest discomfort.   FOLLOWUP:  1. He will have a PT and INR on January 15, 2006, at 3:15 p.m. at the      [facility].  The target INR should be between 2-3,      given his history of pulmonary embolism and deep venous thrombosis.  2. He is asked  to keep his appointment with Dr. Excell Seltzer on January 22, 2006, at 12:15 p.m.  At the time of followup with Dr. Excell Seltzer,      arrangements for blood work, in regards to FLP and liver function tests      should be arranged for six to eight weeks since the Lipitor was      initiated.  Consideration should also be given to an ACE inhibitor or a      beta blocker, if the blood pressure allows, given his coronary artery      disease.   Discharge time is greater than 30 minutes.      Joellyn Rued, PA-C      Veverly Fells. Excell Seltzer, MD  Electronically Signed    EW/MEDQ  D:  01/10/2006  T:  01/10/2006  Job:  [id number]   cc:   Veverly Fells. Excell Seltzer, MD  Georgina Quint Plotnikov, MD

## 2010-07-29 NOTE — H&P (Signed)
NAME:  Jason, [name] NO.:  [id number]   MEDICAL RECORD NO.:  [id number]                   PATIENT TYPE:   LOCATION:                                       FACILITY:   PHYSICIAN:  Jason [name], M.D. [facility]      DATE OF BIRTH:   DATE OF ADMISSION:  09/08/2002  DATE OF DISCHARGE:                                HISTORY & PHYSICAL   CHIEF COMPLAINT:  Almost passed out.   HISTORY OF PRESENT ILLNESS:  The patient is an 77 year old male who got up  to urinate this morning.  He was urinating for a long time, and developed  lightheadedness, almost passed out, felt sweaty and weak.  There was no  chest pain, but he felt somewhat short of breath.  The weakness has remained  throughout the early afternoon.  There was no nausea or vomiting.  No  previous episodes like that.   MEDICATIONS:  1. Flomax 0.4 mg daily.  2. Aspirin.   ALLERGIES:  None.   PAST MEDICAL HISTORY:  1. Benign prostatic hypertrophy.  2. Coronary disease.  3. Gastroesophageal reflux disease.   SOCIAL HISTORY:  He is married, nonsmoker.  Remains physically very active.   FAMILY HISTORY:  Father died of an MI.   REVIEW OF SYSTEMS:  He has been swimming every morning, remains physically  active.  No exertional chest pain, no syncope.  The rest is as above or  negative.  No GI bleeding symptoms.   PHYSICAL EXAMINATION:  VITAL SIGNS:  Blood pressure 110/70, pulse 64,  temperature 97.1.  GENERAL:  He is in no acute distress.  Appears well, slightly anxious.  HEENT:  Moist mucosa.  NECK:  Supple, no thyromegaly, right-sided bruit.  HEART:  S1 and S2, bradycardic.  No murmur, no gallop.  LUNGS:  Clear to auscultation and percussion, no wheezes or rales.  ABDOMEN:  Soft and nontender, no organomegaly or masses felt.  EXTREMITIES:  Lower extremities without edema.  NEUROLOGIC:  He is alert, oriented, and cooperative.  Denies being  depressed.  Cranial nerves II-XII normal.  Strength  is normal.   LABORATORY DATA:  EKG with sinus bradycardia.  Heart rate 51.   ASSESSMENT AND PLAN:  1. Near syncopal episode, likely micturitional, aggravated by baseline     bradycardia.  Rule out myocardial infarction, rule out arrhythmia.  Admit     to telemetry, CK x3, troponin x3 q.8h.  2. Hyperlipidemia.  We will check lipids in the morning.  He is not taking     any medications.  3. Right carotid bruit.  Obtain arterial Doppler.  4. Coronary artery disease.  Plan as above.  He may need a cardiology     consult.  Jason [name], M.D. [facility]    AVP/MEDQ  D:  09/08/2002  T:  09/08/2002  Job:  [id number]   cc:   Jason [name], M.D.  [address], [address]  [place], [place] [address]  Fax: [phone]   Jason [name]., M.D.  [address]  [place]  [place] [address]  Fax: [phone]

## 2010-07-29 NOTE — Discharge Summary (Signed)
   NAME:  OLLIVER, BOYADJIAN                            ACCOUNT NO.:  [id number]   MEDICAL RECORD NO.:  [id number]                   PATIENT TYPE:  INP   LOCATION:  3731                                 FACILITY:  [facility]   PHYSICIAN:  Rene Paci, M.D. [facility]          DATE OF BIRTH:  16-Dec-1919   DATE OF ADMISSION:  09/08/2002  DATE OF DISCHARGE:  09/09/2002                                 DISCHARGE SUMMARY   DISCHARGE DIAGNOSES:  Near-syncope.   BRIEF HISTORY:  Mr. Krammes is an 77 year old who had a near-syncopal episode  at home while urinating.   PAST MEDICAL HISTORY:  1. BPH.  2. Coronary artery disease.  3. Gastroesophageal reflux disease.   HOSPITAL COURSE:  Problem #1.  Near-syncope during micturition:  The patient  ruled out for myocardial infarction.  He did have some mild bradycardia, but  without pauses or other arrhythmias, and he was asymptomatic.  He had some  relative hypertension, but no orthostatic hypotension.  He did have carotid  Dopplers done that showed some right 40-50% ICA stenosis, but nothing to  account for a near-syncopal episode.  Our final conclusion was that his near-  syncope occurred during micturition.  The patient had no further episodes  and was stable for discharge home.   Problem #2.  Benign prostatic hypertrophy:  The patient was on Flomax, and  we have changed this to Avodart.   Problem #3.  Macrocytosis without anemia:  We did check a B12 and folate  level.   DISCHARGE LABORATORY DATA:  Hemoglobin 15.9.  LFTs normal.  CMET was normal.  Cardiac enzymes negative.  Total cholesterol 183, triglycerides 78, HDL 40,  LDL 127.  TSH 2.88.  PSA 0.77.  B12 and folate pending.   DISCHARGE MEDICATIONS:  1. Avodart 0.5 mg daily.  2. Vioxx 25 mg, 1/2 tablet p.r.n.  3. Aspirin 81 mg daily.   FOLLOWUP:  Follow up with Dr. Posey Rea as needed.     Vernona Rieger SD., Eulah Pont, P.A.-C  [facility]          Rene Paci, M.D. [facility]   LSC/MEDQ  D:  09/26/2002  T:   09/27/2002  Job:  [id number]   cc:   Georgina Quint. Plotnikov, M.D. [facility]   cc:   Georgina Quint. Plotnikov, M.D. [facility]

## 2010-07-29 NOTE — H&P (Signed)
NAME:  [name], [name] NO.:  [id number]   MEDICAL RECORD NO.:  [id number]           PATIENT TYPE:   LOCATION:                                 FACILITY:   PHYSICIAN:  Corwin Levins, M.D. [facility]     DATE OF BIRTH:   DATE OF ADMISSION:  07/14/2005  DATE OF DISCHARGE:                                HISTORY & PHYSICAL   CHIEF COMPLAINT:  Leg pain and shortness of breath.   HISTORY OF PRESENT ILLNESS:  [name] is an 77 year old white male here  with two days sudden onset very painful right lower extremity pain,  swelling, and redness below the knee.  There was some shortness of breath  last p.m., but no fever or other symptoms during this time.   PAST MEDICAL HISTORY:   ILLNESSES:  1.  History of CVA currently off Aggrenox as it was too expensive.  2.  BPH.  3.  Bilateral hydroceles.  4.  GERD with esophageal stricture status post dilation 2004.  5.  Peripheral vascular disease status post right CEA, Dr. Madilyn Fireman.  6.  DJD.  7.  History of coronary artery disease status post PTCA x3 last five years      ago per Dr. [place], cardiology.  8.  Hypercholesterolemia.  9.  Anxiety.   ALLERGIES:  HYOSCYAMINE.   CURRENT MEDICATIONS:  1.  Ecotrin 325 mg p.o. daily.  2.  Flomax 0.4 mg p.o. daily.   FAMILY HISTORY:  Noncontributory.   SOCIAL HISTORY:  No tobacco.  No alcohol.  Married.  Lives with the wife and  physician roster includes Dr. Anne Hahn, neurology, Dr. Leone Payor,  gastroenterology, Dr. Retta Diones, urology, Dr. Madilyn Fireman of vascular surgery, and  Dr. [place], cardiology.   PHYSICAL EXAMINATION:  GENERAL:  [name] is an 77 year old white male,  alert, pleasant.  VITAL SIGNS:  Blood pressure 127/79, respirations 20, pulse 71, temperature  97.5, weight 190.  HEENT:  Sclerae clear.  TMs clear.  Pharynx benign.  NECK:  Without lymphadenopathy, JVD, thyromegaly.  CHEST:  No rales or wheezing.  CARDIOVASCULAR:  Regular rate, rhythm.  No murmur.  ABDOMEN:  Soft,  nontender.  Positive bowel sounds.  No organomegaly.  No  masses.  EXTREMITIES:  With 3+ painful red, swollen leg below the knee.  NEUROLOGIC:  Cranial nerves II-XII intact, otherwise nonfocal.  The left  index finger with some persistent redness and swelling consistent with  resolving cellulitis treated per Dr. Everardo All last week.   ASSESSMENT/PLAN:  1.  Right lower extremity pain, swelling, and erythema.  Probable deep      venous thrombosis and pulmonary embolism.  Needs to be admitted.  Given      therapeutic Lovenox for now.  Check right lower extremity venous      Dopplers and chest CT with pulmonary embolism protocol.  Will check CBC,      coags, and other routine laboratories.  2.  Cellulitis, resolving first index finger left hand, but also cannot rule      out some cellulitis of the right lower extremity as  well.  Will start      Kefzol 1 g intravenous q.8h.  3.  History of cerebrovascular accident.  Continue the aspirin as he will      likely need Coumadin and will forego the Aggrenox restart at the moment.  4.  Benign prostatic hypertrophy, otherwise stable.  Continue Flomax one      daily.  5.  Other medical problems otherwise stable.  Continue as before.           ______________________________  Corwin Levins, M.D. [facility]     JWJ/MEDQ  D:  07/14/2005  T:  07/14/2005  Job:  [id number]

## 2010-07-29 NOTE — Assessment & Plan Note (Signed)
[facility]                              CARDIOLOGY OFFICE NOTE   CLEAVON, GOLDMAN                         MRN:          [id number]  DATE:01/22/2006                            DOB:          08-27-1919    Mr. Murty is a very pleasant 77 year old male who presents today for  hospital follow-up. He was recently admitted to [facility] with pre-  syncope and chest pain. Dr. Gala Romney saw him in consultation on October  25th and in the setting of his known coronary artery disease he was referred  for cardiac catheterization. The patient also has a history of bilateral  pulmonary emboli in May of this year and has been on Coumadin therapy since  that time. His cardiac catheterization demonstrated severe stenosis of the  second obtuse marginal branch of his left circumflex. He had non-obstructive  disease in his other major epicardial coronary arteries. He was treated with  percutaneous coronary intervention using a bare metal stent with an  excellent angiographic result. He is to receive 30 days of clopidogrel  therapy with lifelong aspirin and warfarin.   Mr. Hawthorne has done well since his hospital discharge. He has developed  stomach upset and diarrhea that his daughter thinks is related to his  Lipitor. He had previously not been on statin therapy, but since starting  Lipitor he has developed GI upset that occurs approximately two hours after  taking this medication on a regular basis. Other than that, he has done  quite well. He has not had any further chest or abdominal pain. He has had  no dyspnea. He has no other complaints at this time.   Studies from his hospitalization are as follows:  Cardiac catheterization demonstrated three vessel coronary artery disease  with non-obstructive disease in the LAD. Moderate disease in the right  coronary artery and severe disease in the left circumflex as described  above. He has normal left  ventricular function. His ejection fraction  estimated by catheterization was 50% to 55%. His last echocardiogram from  November 2006, demonstrated left ventricular ejection fraction between 55%  to 65% with trivial valvular disease and moderate left atrial dilatation. He  did have mild-moderate tricuspid regurgitation with a moderately dilated  right atrium as well.   PHYSICAL EXAMINATION:  On examination today, the patient is alert and  oriented elderly male in no acute distress. His weight is 181 pounds. Blood  pressure is 127/77. Heart rate is 61. Respiratory rate is 16.  HEENT: Normocephalic, atraumatic.  NECK: Normal carotid upstrokes without bruits. Jugulovenous pressure is  normal.  LUNGS:  Clear to auscultation bilaterally.  CARDIOVASCULAR: Regular rate and rhythm without murmurs or gallops.  ABDOMEN: Soft and nontender. No organomegaly.  EXTREMITIES: No clubbing, cyanosis or edema. Peripheral pulses are 2+ and  equal throughout.   EKG: Demonstrates normal sinus rhythm. He has a right IVCD and the ECG is  otherwise normal.   ASSESSMENT:  Mr. Sevillano is currently stable from a cardiovascular standpoint.  His current cardiovascular issues are as follows:  1. Coronary artery disease,  status post PCI with a bare metal stent to the      second obtuse marginal branch of the left circumflex. He should remain      on dual anti-platelet therapy with aspirin and clopidogrel for a total      of 30 days. He should continue on lifelong aspirin. In the setting of      his chronic warfarin therapy, because of bilateral pulmonary emboli, I      think the risk of longterm Plavix is not warranted due to increased      bleeding rates on all three anticoagulant medications.  2. Dyslipidemia. He does not appear to be tolerating Lipitor well. We have      elected to start him on Zocor 40 mg daily to see if this better      tolerated. He should have lipids and LFTs in eight weeks.  3. Follow up.  Will plan on seeing him back in approximately six months for      his regular followup. Will be in contact with him after the results of      his lipid studies are back. I asked his daughter to contact me if he      does not tolerate Zocor.     Veverly Fells. Excell Seltzer, MD  Electronically Signed    MDC/MedQ  DD: 01/22/2006  DT: 01/22/2006  Job #: [id number]

## 2010-07-29 NOTE — H&P (Signed)
NAME:  Jason, [name] NO.:  [id number]   MEDICAL RECORD NO.:  [id number]          PATIENT TYPE:  INP   LOCATION:  5028                         FACILITY:  [facility]   PHYSICIAN:  Rosalyn Gess. Norins, M.D. LHCDATE OF BIRTH:  1919-06-16   DATE OF ADMISSION:  06/05/2004  DATE OF DISCHARGE:                                HISTORY & PHYSICAL   CHIEF COMPLAINT:  Dizziness.   HISTORY OF PRESENT ILLNESS:  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 He does have a history of surgery on his right ear in 1978. Over the  two weeks prior to admission, he has had increasing problems with  dysequilibrium, much worse than in the past. The patient also reports that  he has been having problem with word finding and also feels that his  cognitive level is somewhat depressed. On questioning, he does admit to  having a little bit of drooling from the right side of his mouth but denies  any facial numbness. He has had no focal weakness. He has had n o loss of  sensation. The patient reports that when he stands he does have  retropulsion. The patient was seen by his physician, Dr. Georgina Quint.  [name], and had been started on medicine (most likely Meclizine) and has  not helped. The patient is now admitted with progressive dysequilibrium,  falls suggestive of cerebellum or brain stem CVA.   PAST SURGICAL HISTORY:  1.  Right tympanic membrane surgery.  2.  Contracture release of his right palm.   PAST MEDICAL HISTORY:  1.  Usual childhood diseases.  2.  Benign prostatic hypertrophy.  3.  Coronary artery disease with a history of cardiac catheterization x 2      with PTCA.  4.  GERD.   PHYSICIANS:  1.  Jason Quint. [name], M.D., primary care physician.  2.  Jason [name], M.D., cardiology.  3.  Dr. Anner Crete was his ENT doctor.   CURRENT MEDICATIONS:  1.  Flomax 0.4 mg q.d.  2.  Aspirin q.d.   HABITS:  Tobacco:  None.  Alcohol:  Rare.   ALLERGIES:  The patient has no known drug allergies.   FAMILY HISTORY:  Noncontributory in an 77 year old.   SOCIAL HISTORY:  The patient does live alone. His wife is in an assisted  living situation. The patient has a devoted family and very supportive.   REVIEW OF SYMPTOMS:  The patient has had no constitutional symptoms. He has  had no cardiovascular symptoms, no pulmonary symptoms, and no GI problems.   PHYSICAL EXAMINATION:  VITAL SIGNS:  Temperature is 96.9, blood pressure  148/82, pulse 55, respirations 20. O2 saturation was 96% on room air.  GENERAL:  A well-developed, well-nourished Caucasian male who looks his  stated age in no acute distress.  HEENT:  Normocephalic and atraumatic with no hematomas or signs of trauma.  No battle signs, no racoon's eyes. Right EAC with some cerumen. TM was  definitely postsurgical. Left EAC with some cerumen.  TM appeared normal.  Oropharynx without lesions. Conjunctivae and sclerae were clear.  NECK:  Supple. There was no thyromegaly.  CHEST:  The patient was moving air well with no rales, wheezes, or rhonchi.  CARDIOVASCULAR:  There were 2+ radial pulses. No JVD or carotid bruits. He  had a quiet precordium. His heart rate was regular. I appreciated no  murmurs, gallops, or rubs.  ABDOMEN:  Soft. No guarding or rebound. No organosplenomegaly was noted.  GENITALIA:  Deferred.  RECTAL:  Deferred.  EXTREMITIES:  Without clubbing, cyanosis, or edema. He does have some mild  arthritic changes to his fingers.  SKIN:  He has a scar on his right palm. The patient's skin is clear.  NEUROLOGICAL:  The patient is awake and alert. He is oriented to person,  place, time, context, and current events. Speech is clear. He could say no  ifs, ands or buts without difficulty. He was able to do calculations and  serial 7's. Cranial nerves II through XII intact. The patient has normal  facial symmetry and movement.  Normal grimace. No deviation of the tongue or  uvula. Extraocular muscles were intact. Pupils were equal, round, and  reactive. Funduscopic exam was difficult in the ER and was not visualized.  Motor strength was 5/5 and symmetrical. Deep tendon reflexes were 2+ at the  biceps tendons and radial tendons bilaterally, 1+ at the patella tendons  bilaterally, 1+ at the Achilles tendon. Sensation was well preserved.  Cerebellar function: The patient was able to sit without assistance but did  feel lightheaded. The patient was able to stand with assistance but had  significant imbalance tilting to the left and back. Could not stand  unassisted.   LABORATORY DATA:  Chemistries were unremarkable. LS-spine films showed no  fracture, dislocation.   ASSESSMENT/PLAN:  1.  Neurologic:  The patient with an exacerbation of his dysequilibrium with      multiple falls. Also with minimal cognitive symptoms and actually an      unremarkable examination except for imbalance. Suspect the patient may      have had a cerebellar or brain stem infarct. Plan is to admit the      patient to a regular bed. We will obtain a MRI/MRA including carotids.      We will continue on full dose aspirin for now. We will get physical      therapy assessment for evaluation of gait and safety.  2.  Cardiovascular:  The patient with  history of coronary artery disease,      presently stable with no cardiac complaints.  3.  Benign prostatic hypertrophy:  Stable. The patient will continue on      Flomax.      MEN/MEDQ  D:  06/05/2004  T:  06/05/2004  Job:  [id number]   cc:   Jason Quint. [name], M.D. [facility]   Jason [name], M.D.  [address]  [place], [place] [address]  Fax: [phone]

## 2010-07-29 NOTE — Assessment & Plan Note (Signed)
[facility]                         GASTROENTEROLOGY OFFICE NOTE   GABRIELA, GIANNELLI                         MRN:          [id number]  DATE:02/21/2006                            DOB:          02-08-1920    CHIEF COMPLAINT:  Abdominal and chest pain.   HISTORY:  [name] is an 77 year old white man that had a bare metal  stent placed into a coronary artery recently, at the end of October.  Subsequent to that, he has continued to have chest pain which starts as  epigastric pain radiating up into the chest, the neck, and down into the  left arm.  Prilosec seems to help some.  He has been increased to twice  a day by Dr. Daleen Squibb with slight benefit.  Every night at 7:00, he seems to  have this problem.  He usually eats at 5 p.m.  It is not associated with  any particular food or meals.  There has not really been any significant  weight loss.  There is a history of esophageal stricture dilated in  September 2004.  He also had a 5 cm hiatal hernia.  He does not describe  dysphagia at this time.  I do not think he has really vomited.  He had a  stress test which showed no significant ischemia as part of an  evaluation of this, after his stenting was performed.   MEDICATIONS:  1. Flomax 0.4 mg daily.  2. Baby aspirin daily.  3. Allopurinol 100 mg daily.  4. Coumadin 3 mg Wednesday, 6 mg Monday, Thursday, Tuesday, Friday,      Saturday, Sunday.  5. Prilosec 20 mg twice daily.  6. Temazepam p.r.n.  7. Nitroglycerin p.r.n.   DRUG ALLERGIES:  None known.   PAST MEDICAL HISTORY:  1. Coronary artery disease with recent acute coronary syndrome and      bare metal stenting.  2. Gastroesophageal reflux disease with peptic stricture.  3. Hiatal hernia.  4. Benign Prostatic hypertrophy.  5. Dyslipidemia.  6. Left ear drum removal.  7. Prior angioplasty.  8. Carotid endarterectomy.  9. History of stroke.  10.History of deep venous thrombosis and pulmonary  embolism,      subsequently on Coumadin (May 2007).  11.Anxiety.   REVIEW OF SYSTEMS:  No respiratory difficulty.  He is still active.  He  swims, walks as much as he can if he sleeps well.  Frequently, these  symptoms keep him up at night.   PHYSICAL EXAMINATION:  VITAL SIGNS:  Weight 179 pounds, pulse 76, blood  pressure 122/82.  LUNGS:  Clear.  HEART:  S1, S2.  No murmurs, rubs, or gallops.  I see no jugular venous  distention.  NECK:  Supple.  No mass or thyromegaly.  ABDOMEN:  Soft, nontender without organomegaly or mass.  EXTREMITIES:  No edema.  He is oriented x3.   ASSESSMENT:  Epigastric pain, chest pain, unclear etiology.  Possibilities include some sort of severe esophagus dysmotility,  entrapment of his hiatal hernia with a volvulus type picture or  paraesophageal hernia, mesenteric ischemia, ulcer disease, malignancy.  It is a little unusual that it occurs just about every night at 7 p.m.  I do not understand the significance of that.   PLAN:  Schedule upper gastrointestinal endoscopy on Coumadin.  We will  do that tomorrow and further plans pending that.     Iva Boop, MD,FACG  Electronically Signed    CEG/MedQ  DD: 02/21/2006  DT: 02/21/2006  Job #: [id number]   cc:   Thomas C. Wall, MD, [facility]  Aleksei V. Plotnikov, MD

## 2010-07-29 NOTE — Discharge Summary (Signed)
NAME:  Jason [name], Jason [name] NO.:  [id number]   MEDICAL RECORD NO.:  [id number]          PATIENT TYPE:  INP   LOCATION:  4735                         FACILITY:  [facility]   PHYSICIAN:  Valetta Mole. Swords, MD    DATE OF BIRTH:  19-Feb-1920   DATE OF ADMISSION:  04/03/2006  DATE OF DISCHARGE:                               DISCHARGE SUMMARY   DISCHARGE DIAGNOSES:  1. Small bowel obstruction.  2. Incomplete rotation/malrotation of the jejunum.  3. Coronary artery disease.  4. Carotid vascular disease.  5. Gastroesophageal reflux disease.  6. History of pulmonary emboli (deep vein thrombosis) on Warfarin.  7. Anxiety.  8. Dyslipidemia.   DISCHARGE MEDICATIONS:  1. Zetia 10 mg p.o. every day.  2. Aspirin 81 mg p.o. every day.  3. Coumadin at home dose.  4. Flomax 0.4 mg p.o. every day.  5. Allopurinol 100 mg p.o. every day.  6. Restoril 15 mg p.o. q.h.s.  7. __________  20 mg b.i.d.   CONDITION ON DISCHARGE:  Improved.  The patient is tolerating a diet  without difficulty.   FOLLOWUP PLANS:  Dr. Posey Rea two weeks.   RESTRICTIONS:  The patient is to eat small meals frequently during the  day.   DISCHARGE LABORATORY:  White blood cell count 3.8, hemoglobin 13.6,  platelet count 177,000.  ProTime INR of 1.6 at discharge.  Cardiac  enzymes remain negative in the hospital.   CONSULTATIONS:  Hedwig Morton. Juanda Chance, M.D., GI.   PROCEDURES:  Upper GI performed demonstrated incomplete malrotation of  the jejunum.   HOSPITAL COURSE:  1. The patient was admitted to the hospitalist service on April 03, 2006.  See Dr. Diamantina Monks admission note and Dr. Regino Schultze consult      note.  The patient was admitted with severe epigastric pain thought      to be secondary to a small bowel obstruction.  The patient was      treated conservatively with IV fluids.  Upper GI was performed with      results as above.  Small bowel obstruction resolved spontaneously.      The patient was seen  in consultation by GI who recommended small      frequent meals throughout the day as his congenital anomaly may put      him at risk for recurrent small bowel obstructions.  No surgical      consultation was obtained at this time as this was his first      episode of obstruction.  2. Coronary artery disease.  The patient ruled out for myocardial      infarction with enzymes.  3. History of pulmonary embolism.  INR is not at goal at the time of      discharge but I think his risk of recurrent PE in the short run is      small and his INR is likely to be within therapeutic range within      the next day or two.  4. Other medical problems as listed in admission history and physical.  Bruce Rexene Edison Swords, MD  Electronically Signed     BHS/MEDQ  D:  04/05/2006  T:  04/05/2006  Job:  [id number]   cc:   Georgina Quint. Plotnikov, MD

## 2010-07-29 NOTE — Assessment & Plan Note (Signed)
[facility]                            CARDIOLOGY OFFICE NOTE   Jason [name], Jason [name]                         MRN:          [id number]  DATE:02/16/2006                            DOB:          [birth year]    Jason [name] comes in today after calling the office with chest discomfort.   He is an 77 year old gentleman who had a negative stress Myoview several  days before he presented with syncope and chest pain.   At that time he had a heart catheterization which showed severe stenosis  in the second obtuse marginal branch of the circumflex.  He had  nonobstructive disease otherwise.  He was treated with a bare-metal  stent with an excellent result.   He saw Dr. Excell Seltzer back on January 22, 2006.  He was doing well.   Unfortunately, he had a TIA and was admitted to Dr. Marlis Edelson service on  January 28, 2006.  CT was negative.  Cardiac enzymes were also drawn at  that time and were negative.  He had nonobstructive disease in his  carotids.   He has been maintained on Coumadin for a long time because of a history  of pulmonary emboli.  He is also on aspirin.   Over the past week he has had several episodes of pain at rest, usually  at night when he is lying down.  It is described as a pressure in his  chest, and his arm gets tingly.  He has a lot of reflux symptoms as well  with this.   He has exercised a couple of times this week with no symptoms  whatsoever.   CURRENT MEDICATIONS:  1. Flomax 0.4 mg daily.  2. Coumadin.  3. Aspirin 81 mg a day.  4. Allopurinol 100 mg daily.  5. Prilosec 20 mg a day.   PHYSICAL EXAMINATION:  He is in no acute distress.  His daughter is  here, who is very helpful.  His blood pressure is 132/82, his pulse is 61and regular.  His weight is  184.  Skin is warm and dry.  Respiratory rate is 18.  Carotid upstrokes are equal bilaterally with soft systolic sound on the  left.  The thyroid is not enlarged.  Trachea is  midline.  LUNGS:  Clear.  There is no rub.  HEART:  Reveals a regular rate and rhythm, normal S1, S2.  ABDOMEN:  Exam is soft with good bowel sounds.  He is a little tender in  the epigastric area.  There is no hepatomegaly, there is no hepatic  tenderness.  EXTREMITIES:  Revealed no cyanosis, clubbing or edema.  Pulses are  intact.  SKIN:  Intact except for a few ecchymoses.  MUSCULOSKELETAL:  Intact.   EKG is normal.   I had a long talk with Jason [name] and his daughter.  I suspect there are  several components to this chest pain.  First is that he has a new  stent, and he is very anxious about his heart.  He readily admits this,  as does his daughter.  Second, he has  reflux and has taken Prilosec  daily for years.  I have asked him to double this.  Three, he had a  recent TIA and he is very worried about a stroke.   After a long discussion, we decided to set him up for an exercise  rest/stress Myoview early next week.  Even though this was negative  before, at least it will assure [place] that he is getting good blood flow  and does not have any obvious problems.  I think he needs this objective  reassurance as well.  He knows how to use nitroglycerin and how to  activate 911 otherwise.  I have doubled his Prilosec to b.i.d.  I will  see him see Dr. Excell Seltzer towards the end of the week.  He also has an  appointment with Dr. Stan Head in the near future as well.     Thomas C. Daleen Squibb, MD, [facility]  Electronically Signed    TCW/MedQ  DD: 02/16/2006  DT: 02/17/2006  Job #: [id number]   cc:   Veverly Fells. Excell Seltzer, MD  Iva Boop, MD,FACG

## 2010-07-29 NOTE — Cardiovascular Report (Signed)
NAME:  Jason [name], Jason [name] NO.:  [id number]   MEDICAL RECORD NO.:  [id number]          PATIENT TYPE:  INP   LOCATION:  6522                         FACILITY:  [facility]   PHYSICIAN:  Veverly Fells. Excell Seltzer, MD  DATE OF BIRTH:  12-30-1919   DATE OF PROCEDURE:  DATE OF DISCHARGE:                              CARDIAC CATHETERIZATION   PROCEDURE:  Left heart catheterization, selective coronary angiography, left  ventricular angiography, PTCA and stenting of the second obtuse marginal and  Angio-Seal of the right femoral artery.   INDICATION:  Jason [name] is a very pleasant 77 year old male who has known  coronary artery disease and presented with chest and abdominal pain.  His  pain had both typical and apical features, but he continued to have chest  pain during his hospitalization and he was ultimately referred for cardiac  catheterization in the setting of his known coronary artery disease and  resting chest pain.   PROCEDURAL DETAILS:  Risks and indications of the procedure were explained  in detail to the patient.  Informed consent was obtained.  The right groin  was prepped, draped and anesthetized with 1% lidocaine.  Using the modified  Seldinger technique, a 6-French arterial sheath was placed in the right  common femoral artery.  Multiple angiographic views were taken of both the  left and right coronary arteries.  For the left coronary artery a 6-French  JL-4 catheter was used, for the right coronary artery a 6-French JR-4  catheter was used.  Following selective coronary angiography, a left  ventriculogram was performed in the 30-degree RAL projection.  Left  ventricular pressures were recorded.  A pullback across the aortic valve was  done.  Following the diagnostic portion of the procedure, I elected to  proceed with percutaneous coronary intervention of a high-grade large  diameter second obtuse marginal lesion.  See below for details.  All  catheter exchanges were  performed over a guidewire.   FINDINGS:  Aortic pressure 129/59 with a mean of 89, left ventricular  pressure 131/4 with an end-diastolic pressure of 13.  There was no aortic  stenosis.   Coronary angiography:  The left mainstem is angiographically normal.  It  bifurcates into the LAD and left circumflex.  The LAD is a large caliber  vessel that courses down to the left ventricular apex.  The proximal portion  of the LAD has a nonobstructive 40% lesion.  The first diagonal branch has  an 80% lesion, but this is a small diameter vessel.  The lesion in the  diagonal is in the proximal body of that vessel.  It is not an ostial  lesion.  The mid-LAD has 30% serial stenosis.  There is no obstructive  disease seen throughout the LAD system.   Left circumflex:  The left circumflex is a large caliber vessel.  It gives  off two large obtuse marginal branches.  The first obtuse marginal has no  significant angiographic disease.  The second obtuse marginal which extends  down and supplies the inferolateral wall has a 90% stenosis in its proximal  portion.  This is a  fairly long area of disease.  The true circumflex is  small diameter and courses down the AV groove.   The right coronary artery is dominant.  It has 50% disease in its proximal  segment and then a 60% lesion in its mid-segment.  The remainder of the  right coronary artery has nonobstructive luminal irregularities throughout.  Distally, it terminates in a PDA and a posterior AV segment.  The PDA is  angiographically normal.  Posterior AV segment is angiographically normal.  The posterior AV segment gives off three posterolateral branches all of  which are small with no obstructive disease.   Left ventriculogram performed in the 30-degree right arterial bleak  projection demonstrates low-normal left ventricular function with a left  ventricular ejection fraction in the range of 50-to-55%.   ASSESSMENT:  1. Three-vessel coronary  artery disease.      a.     Nonobstructive LAD disease and high-grade first diagonal lesion.       The first diagonal is very small.      b.     Moderate right coronary artery disease.      c.     Severe left circumflex stenosis in a large second obtuse       marginal branch.  2. Normal left ventricular function.   PLAN:  In the setting of the patient's resting chest discomfort, I elected  to proceed with percutaneous coronary intervention of the second obtuse  marginal.  This was a very large vessel that had a severe stenosis in its  proximal segment.  The first diagonal lesion appears to be significant, but  this is a small vessel and is not amenable to PCI.  The disease in the right  coronary artery appears moderate and can be managed medically.   A 6-French CLS-35 guiding catheter was inserted into the left coronary  artery.  Angiomax was used for anticoagulation.  The patient had previously  been on heparin and he had an initial ACT of 206.  After the Angiomax bolus  was given, the lesion was crossed with a ProWater coronary guidewire.  A 2.0  x 20 balloon was inserted and inflated to 8 atmospheres on two subsequent  inflations.   Following balloon dilatation, there was some recoil in the vessel and  persistent high-grade stenosis throughout the mid-portion.  As this was a  very long lesion, I used a 2.75 x 32 mm Liberte stent.  Bare-metal stent was  chosen because the patient is on warfarin for prior history of pulmonary  embolus.  This way he would not require long-term dual antiplatelet therapy  in addition to Coumadin.  The 2.75 x 32 mm Liberte stent was deployed at 12  atmospheres.  There was good stent expansion.  Following stent deployment,  there was TIMI-3 flow in the vessel.  The stent was post-dilated with a 3.0  x 20 mm Quantum Maverick balloon up to 16 atmospheres on two subsequent  inflations to cover the entire stent.  At the conclusion of the intervention, there  was 0% residual stenosis with  TIMI-3 flow.  The patient was chest pain free.   RESULTS:  PCI of the second obtuse marginal with a Liberte bare-metal stent  90-to-0% stenosis.  TIMI-3 flow.  The patient should receive dual  antiplatelet therapy with aspirin and Plavix for 30 days.  He was given 600  mg of Plavix at the conclusion of the procedure.  Veverly Fells. Excell Seltzer, MD  Electronically Signed     MDC/MEDQ  D:  01/09/2006  T:  01/09/2006  Job:  [id number]

## 2010-07-29 NOTE — H&P (Signed)
NAME:  Jason [name], Jason [name] NO.:  [id number]   MEDICAL RECORD NO.:  [id number]          PATIENT TYPE:  INP   LOCATION:  4707                         FACILITY:  [facility]   PHYSICIAN:  Rod Holler, MD     DATE OF BIRTH:  06/12/1919   DATE OF ADMISSION:  01/03/2006  DATE OF DISCHARGE:  01/04/2006                                HISTORY & PHYSICAL   CHIEF COMPLAINT:  Chest pain.   HISTORY OF PRESENT ILLNESS:  Mr. Conely is an 77 year old male with a known  history of peripheral vascular disease, history of a CVA status post right  carotid endarterectomy, coronary artery disease status post PTCA, history of  PE, who presents to the emergency department after not feeling well today.  Of note, last week the patient had episode of palpitations, and was started  on Diltiazem as an outpatient.  Today, the patient has generally not felt  well.  He felt extremely weak, had an episode of near syncope after feeling  lightheaded in the upright position, with no true syncope.  He has had no  complaints of palpitations.  He has no complaints of PND or orthopnea, no  lower extremity swelling.  He also had an episode of chest discomfort in his  left chest that occurred at rest, described it as a tightness, associated  with shortness of breath, no nausea or diaphoresis.  At the current time,  the patient has no chest pain.   PAST MEDICAL HISTORY:  1. CVA status post right carotid endarterectomy.  2. BPH.  3. GERD.  4. Esophageal stricture, status post dilatation.  5. DJD.  6. Coronary artery disease, status post PTCA, but no records of the vessel      intervened upon.  7. Anxiety.  8. Hyperlipidemia.  9. History of PE.   MEDICINES:  1. Flomax 0.4 mg p.o. daily.  2. Coumadin 6 mg p.o. daily except 3 mg daily on Wednesday and Saturday.  3. Allopurinol 100 mg p.o. daily.  4. Diltiazem 240 mg p.o. daily.   ALLERGIES:  HYOSCYAMINE.   SOCIAL HISTORY:  The patient is a nonsmoker,  lives in assisted-living with  his wife.   FAMILY HISTORY:  Premature coronary artery disease in his brother.   REVIEW OF SYSTEMS:  All systems are reviewed in detail are negative except  as listed in the History of Present Illness.   PHYSICAL EXAMINATION:  VITAL SIGNS:  Blood pressure 132/68, heart rate 52,  respiratory rate 23, oxygen saturation 100% on room air, temperature 97.6.  GENERAL:  Well-developed well-nourished elderly male, alert and oriented x3,  no apparent distress.  HEENT:  Atraumatic, normocephalic.  Pupils equal, round and reactive to  light.  Extraocular movements intact.  Oropharynx clear.  NECK:  Supple.  No adenopathy.  No JVD.  No carotid bruits.  CHEST:  Lungs are clear to auscultation bilaterally with equal bilateral  breath sounds.  CORONARY:  Bradycardic.  Regular.  No murmurs, rubs or gallops.  2+  peripheral pulses.  ABDOMEN:  Soft, nontender, nondistended, active bowel sounds.  EXTREMITIES:  No clubbing, cyanosis or edema.  NEUROLOGIC:  No focal deficits.  SKIN:  No rashes.   LABS:  CK-MB of 1.6, troponin less than 0.05, myoglobin 75, INR 3.5, D-dimer  less than 0.22, sodium 141, potassium 4, chloride 110, bicarb 23, BUN 15,  creatinine 1.3, glucose 106, hematocrit 45.   EKG shows a sinus rhythm, nonspecific T wave changes.   IMPRESSION:  An 77 year old male who presents with chest pain, shortness of  breath, near syncope.   PLAN:  1. Cardiovascular.  Admit the patient to a telemetry bed, rule out with      serial cardiac enzymes, baby aspirin daily, Lipitor daily, hold beta-      blocker and calcium channel blocker given the patient's bradycardia      episode and near syncope.  Daily EKGs x3 days, lipid panel in the      morning.  2. Hematologic.  The patient is on Coumadin for history of PE, which we      will continue.  3. Endocrine.  Thyroid function tests in the morning given his      bradycardia.  4. Fluids, electrolytes and nutrition.   Comprehensive metabolic panel,      CBC, magnesium level in the morning.  NPO after midnight.  5. The patient is a fall risk given his episode of near syncope today.      Rod Holler, MD  Electronically Signed     TRK/MEDQ  D:  01/04/2006  T:  01/04/2006  Job:  [id number]

## 2010-07-29 NOTE — H&P (Signed)
NAME:  Jason [name], Jason [name] NO.:  [id number]   MEDICAL RECORD NO.:  [id number]          PATIENT TYPE:  INP   LOCATION:  3710                         FACILITY:  [facility]   PHYSICIAN:  Darryl D. Prime, MD    DATE OF BIRTH:  Dec 21, 1919   DATE OF ADMISSION:  01/07/2006  DATE OF DISCHARGE:  01/04/2006                                HISTORY & PHYSICAL   SUBJECTIVE:  Patient's daughter is the primary historian, in addition to the  patient.  Patient's primary care doctor is Dr. Posey Rea.   CHIEF COMPLAINT:  Chest pain/chest tightness.   HISTORY OF PRESENT ILLNESS:  Jason [name] is an 77 year old white male with a  history of peripheral vascular disease; history of stroke; history of status  post right carotid endarterectomy; status post history of coronary artery  disease; status post PTCA in the past; history of pulmonary embolism, on  Coumadin; who presents with chest tightness/chest pain.  Patient 2-3 weeks  prior to admission had an episode of tachy palpitations with blood pressure  as high as 174/105, which decreased, per the daughter, to 114/80 by the time  he saw Dr. Jonny Ruiz, who was filling in for Dr. Posey Rea at the time.  In the  clinic patient was starting on diltiazem to keep his heart rate down.  Patient apparently on the 24th was admitted to [facility], 24th of this  month, with complaints of shortness of breath, lightheadedness, chest  tightness, and was admitted for an observation, 23-hour observation.  Discharged with the discontinuance of diltiazem and a plan for a stress test  3 days prior to admission.  Patient apparently had a stress test 3 days  prior to admission, which they describe what sounds like a medical stress  test, nuclear, and not did not hear back from the office on the results.  Patient also had placement of a cardiac event monitor that he has had now  for 3 days.  That was interrogated here and was found to be negative.  Patient was complaining of  chest tightness and associated left upper  quadrant abdominal pain.  Patient also noted left arm pain and left shoulder  numbness, plus nausea.  No emesis.  Patient was given morphine in the  emergency room with significant relief of his nausea.  Patient noted that  while in the ambulance EMS gave him aspirin and nitroglycerin.  He denies  any black stools, bloody stools.  He is on NSAIDs.   PAST MEDICAL HISTORY:  History of stroke status post right carotid  endarterectomy; history of BPH; history of gastroesophageal reflux disease;  history of esophageal stricture status post dilatation; history of  degenerative joint disease; history of coronary artery disease status post  PTCA but no records of the vessel intervened upon, followed by Dr. [place];  history of anxiety not otherwise specified; history of hyperlipidemia;  history of pulmonary embolus, had that episode in May of 2007; PCI, one at  least 6 years ago.  Echocardiogram done March 2006 showed an ejection  fraction of 55-65% with no significant valvular abnormalities.  He has a  history of dysequilibrium.   MEDICATIONS:  At the assisted living facility he is on:  1. Jantoven 3 mg tablets every Monday and Friday.  2. He is on Jantoven 6 mg tablets everyday except Monday and Friday.  3. He is on fish oil 1000 mg p.o. b.i.d.  4. He is on aspirin 81 p.o. daily.  5. Flomax 3.4 mg p.o. daily.  6. He is on temazepam 15 mg p.o. q.h.s.  7. He is on nasal spray 12 hours to each nostril as needed.  8. He is on Genebs 500 mg tablet every 4 hours as needed.  9. He is on Naphcon-A eye drops 1 drop in each eye as needed for dry eyes.  10.He is on ibuprofen 200 mg tablets by mouth every 4 hours as needed for      pain.   ALLERGIES:  He is allergic to HYOSCYAMINE, but patient denies having any  allergies.   SOCIAL HISTORY:  He is a nonsmoker.  He lives in an assisted living facility  with his wife.   FAMILY HISTORY:  Positive for  premature coronary artery disease in his  brother.   REVIEW OF SYSTEMS:  All 10-point review of systems negative, unless as  stated above.   PHYSICAL EXAMINATION:  He is afebrile here with a temperature of 97.7.  Blood pressure 119/70.  Respiratory rate of 14.  Sat is 96% on 2 L.  GENERAL:  He is an elderly male sitting upright in no acute distress.  HEENT:  Sclerae is anicteric.  Extraocular movements are intact.  There are  no xanthelasmas.  Mucous membranes are moist.  Neck is supple with no JVD.  Carotids are 2+ bilaterally.  No bruits.  No  lymphadenopathy or thyromegaly.  Cardiac exam is regular rhythm and rate with no murmurs, rubs, or gallops.  Lungs are clear to auscultation bilaterally.  Abdomen is soft, nondistended, with no hepatosplenomegaly.  Normoactive  bowel sounds.  Patient is tender in the left upper quadrant to deep  palpation, but mildly so.  EXTREMITIES:  Warm, but no cyanosis, clubbing, or edema.  NEUROLOGIC EXAM:  He is alert and oriented x4.  Cranial nerves II-XII are  grossly intact.  Freely movable.  He moves all extremities without  difficulty.   LABORATORY DATA:  His INR is 1.7, PT is 20.5, lipase is 28.  Cardiac markers  done 4:31 a.m. showed an MB of 1.2, troponin less than 0.05, myoglobin 76.4.  CBC showed a white count of 5.9, hemoglobin 15.3, hematocrit 43.6, MCV 99.4,  platelets of 185, segs of 49%, lymphocytes 37%.  BMP showed a sodium of 141,  potassium 3.6, chloride 109, bicarb 26, glucose 95, BUN 15, creatinine 1.1,  calcium of 8.8.  Chest x-ray is pending.  EKG today showed normal sinus  rhythm at 68 with normal access, normal intervals.  No ST-T changes.  No  changes compared to EKG done 01/03/06.   ASSESSMENT/PLAN:  This is a patient with multiple medical problems and  multiple episodes of chest tightness.  This chest tightness is associated  with nausea.  He likely has symptoms related to esophageal stricture or esophageal spasm,  gastroesophageal reflux disease, and less likely a  duodenal stomach ulcer, and he will be admitted to rule out acute coronary  syndrome.   1. First rule out acute coronary syndrome.  He will be admitted and      telemetry will continue.  Aspirin here.  Fish oil  and cardiac markers      x3.  2. He may also be having recurrent pulmonary thrombo-emboli.  We will      continue Coumadin and may consider CT chest to evaluate for possible      recurrent PTEs.  For his BPH we will continue Flomax.  For his anxiety      not otherwise specified we will continue temazepam.  For his      bronchospasm history I will place him on Atrovent nebulizers.  For rule      out ulcers we will hold his NSAIDs.  We will guaiac his stools.  He may      need evaluation      for possible recurrence of esophageal stricture and associated      gastroesophageal reflux disease by a GI medicine consult.  For his      nausea we will give Zofran p.r.n., and for DVT prophylaxis he is on      Coumadin.  We will follow his INR.      Darryl D. Prime, MD  Electronically Signed     DDP/MEDQ  D:  01/07/2006  T:  01/08/2006  Job:  [id number]

## 2010-07-29 NOTE — Discharge Summary (Signed)
NAME:  Jason [name], Jason [name] NO.:  [id number]   MEDICAL RECORD NO.:  [id number]          PATIENT TYPE:  INP   LOCATION:  3041                         FACILITY:  [facility]   PHYSICIAN:  Pramod P. Pearlean Brownie, MD    DATE OF BIRTH:  August 31, 1919   DATE OF ADMISSION:  01/28/2006  DATE OF DISCHARGE:  01/29/2006                                 DISCHARGE SUMMARY   DIAGNOSES AT TIME OF DISCHARGE:  1. Right brain transient ischemic attack.  2. Systemic pain associated with Zocor.  3. Coronary artery disease status post stent two weeks ago.  4. Hypercholesterolemia.  5. History of pulmonary embolism on Coumadin.  6. Chronic nausea.  7. Benign prostatic hypertrophy.  8. Gastroesophageal reflux disease.  9. Esophageal stricture.  10.Degenerative arthritis.  11.Anxiety.  12.History of gout.  13.Right carotid endarterectomy in the past.  14.Peripheral vascular disease.  15.Bilateral cataract surgery.  16.Bilateral hydroceles.   MEDICINES AT THE TIME OF DISCHARGE:  1. Flomax 0.4 mg a day.  2. Coumadin 6 mg every day except Wednesday and on Wednesday it is 3 mg a      day.  3. Zyloprim 100 mg at bedtime.  4. Prilosec 20 mg a day.  5. Plavix 75 mg a day.  6. Aspirin 81 mg a day.  7. Sublingual nitroglycerin p.r.n.  8. Temazepam at bedtime for sleep as needed.  9. Zocor 40 mg a day placed on hold this admission.   STUDIES PERFORMED:  1. CT of the brain on admission showed no acute findings.  Old left      parietal subcortical white matter disease.  2. Carotid Doppler shows a left 40-60% ICA stenosis.  No right ICA      stenosis.  Vertebral flow antegrade.  3. 2-D echocardiogram performed, results pending.  4. EKG shows normal sinus rhythm.   LABORATORY STUDIES:  CBC with MCV 99.4, otherwise normal.  Chemistry normal.  Coagulation studies with INR 2.3 on admission, PT 26.8 and PTT 38.  Liver  function tests normal.  Albumin 3.0.  Cardiac enzymes negative.  Cholesterol  121,  triglycerides 63, HDL 38 and LDL 70.  Urinalysis was negative.  Homocystine is pending.  Hemoglobin A1c is 5.6.   HISTORY OF PRESENT ILLNESS:  Jason [name] is an 77 year old right-handed  white male with a history of coronary artery disease who had a stent placed  two weeks ago.  The patient comes to the [facility] Emergency Room as a code  stroke this morning after he noted sudden onset of left-sided sensory  deficits.  This was associated with numbness in the left face, arm and leg.  He felt like he was dragging his left leg when he went to the bathroom.  The  patient went to bed around 10:30 p.m. the night prior to admission and woke  up around 3 to 3:30 a.m. with the deficit, went to the bathroom and back to  bed.  When he woke up that morning, he noticed that the deficit had not  resolved.  He came to the emergency room.  He last felt normal at 10:30 p.m.  when he went to bed.  The code stroke was cancelled as time of onset was  greater than 3 hours.  The patient had no clear motor deficits, visual  deficits but did have a frontal temporal headache that is resolving.  He  denied slurred speech, problems with word finding.  He was on Coumadin prior  to admission.  He was not a TPA candidate secondary to time of onset and INR  greater than two.   HOSPITAL COURSE:  MRI could not be performed as the patient had a recent  stent.  CT of the brain showed no acute findings.  The patient's neurologic  symptoms resolved within 24 hours and was diagnosed with likely right brain  TIA.  Risk factors include dyslipidemia, coronary artery disease.  He will  need follow-up on his left ICA stenosis in the future, but as he is at  neurologic baseline, he was okay for discharge home.  He was on Zocor prior  to admission.  He has been having stomach pain at night since taking the  Zocor.  Zocor was held during hospitalization and the patient had no stomach  pain.  He will continue to hold Zocor and  have him follow-up with his  primary care physician within the month to figure out what to do next.   CONDITION ON DISCHARGE:  The patient neurologically normal.   DISCHARGE/PLAN:  1. Discharge home with family member.  2. Continue aspirin, Plavix and Coumadin as prior to admission.  Agree      with Coumadin long term for secondary stroke prevention.   FOLLOW UP:  Dr. Pearlean Brownie in two months.      Annie Main, N.P.    ______________________________  Sunny Schlein. Pearlean Brownie, MD    SB/MEDQ  D:  01/29/2006  T:  01/29/2006  Job:  [id number]   cc:   Pramod P. Pearlean Brownie, MD  Georgina Quint Plotnikov, MD

## 2010-07-29 NOTE — Assessment & Plan Note (Signed)
[facility]                         GASTROENTEROLOGY OFFICE NOTE   DEMONT, LINFORD                         MRN:          [id number]  DATE:04/18/2006                            DOB:          09-Feb-1920    CHIEF COMPLAINT:  Followup on abdominal pain.   Jason [name] was admitted to the hospital with small bowel obstruction.  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  His daughter  called back, complaining of persistent postprandial (after supper)  abdominal pain.  He was on Prilosec twice daily and I gave them some  samples of Zegerid 40 mg twice daily and he seems to do better on that.  He is also using Levsin SL one or two at a time with some relief.  Things are better, but he still has this persistent pain within an hour  after eating supper.  He does not have any trouble during the day.  It  sounds like he really has had problems with abdominal pain off and on  for years and was told he had a nervous stomach.  As we talked today, I  explained that he probably has been having problems with this  malrotation issue of the jejunum and that there is a potential towards  volvulus, which is likely what he had, leading to his problems with  obstruction.  That resolved spontaneously and he otherwise feels okay  now.  He has lost a little bit of weight, but there is no progressive  weight-loss.   MEDICATIONS:  1. Zetia daily.  2. Flomax 0.4 mg at bedtime.  3. Aspirin 81 mg daily.  4. Allopurinol 100 mg daily.  5. Coumadin 3 mg on Wednesdays, 6 mg other days of the week.  6. Temazepam p.r.n.  7. Zegerid 40 mg b.i.d.  8. Nitroglycerin p.r.n.  9. Levsin SL p.r.n.   DRUG ALLERGIES:  None known.   PAST MEDICAL HISTORY:  1. As above.  2. Coronary artery disease.  3. Carotid vascular disease.  4. Status post bare metal stent to coronary arteries.  5.  Gastroesophageal reflux disease with peptic stricture in the past.  6. Hiatal hernia.  7. Benign prostatic hypertrophy.  8. Dyslipidemia.  9. Left eardrum resection.  10.Carotid endarterectomy.  11.Previous stroke versus TIA.  12.History of deep venous thrombosis and pulmonary embolism, on      Coumadin since May of 2007.  13.Anxiety.   REVIEW OF SYSTEMS:  No fever, chills.  CONSTITUTIONALLY:  He feels well.  He is active.  Anxiety is not an issue right now.  MUSCULOSKELETAL:  Negative.   PHYSICAL EXAM:  Well-developed, well-nourished, elderly white man,  looking younger than stated age.  Weight 182 pounds, pulse 60, blood  pressure 112/66.  The abdomen is mildly tender diffusely without  organomegaly or masses.  It is not distended.  Bowel sounds are present.   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  He recently had a small  bowel obstruction that  was probably related to some sort of volvulus.  He is at risk for this  again.  His EGD in December demonstrated some reactive gastropathy and  hiatal hernia and a patent ring.  He is improved, but still has some  symptoms.  I do not know if his symptoms now are really related to his  malrotation, the thing that concerns me most is the possibility of a  volvulus and a more severe problem.   PLAN:  Referral to Dr. Wenda Low of general surgery to evaluate for  possible surgical correction.  Admittedly, he is elderly and he has  comorbidities.  It may be reasonable just to follow this, but I think it  is in his best interest to have Dr. Daphine Deutscher evaluate this and render an  opinion.     Iva Boop, MD,FACG  Electronically Signed    CEG/MedQ  DD: 04/18/2006  DT: 04/18/2006  Job #: [id number]   cc:   Thomas C. Wall, MD, [facility]  Aleksei V. Plotnikov, MD

## 2010-07-29 NOTE — Discharge Summary (Signed)
NAME:  Jason [name], Jason [name] NO.:  [id number]   MEDICAL RECORD NO.:  [id number]          PATIENT TYPE:  INP   LOCATION:  5028                         FACILITY:  [facility]   PHYSICIAN:  Rene Paci, M.D. LHCDATE OF BIRTH:  02-12-1920   DATE OF ADMISSION:  06/05/2004  DATE OF DISCHARGE:  06/07/2004                                 DISCHARGE SUMMARY   DISCHARGE DIAGNOSES:  1.  Dysequilibrium, negative neurologic workup, question relation to      orthostatics, see workup below.  2.  History of benign prostatic hypertrophy, consider change of Flomax to      Avodart or other hormonal medication.  3.  History of coronary disease, status post coronary artery bypass x2 and      percutaneous transluminal coronary angioplasty, continue medical      management.  4.  History of gastroesophageal reflux disease.   DISCHARGE MEDICATIONS:  1.  Aspirin 325 mg p.o. daily.  2.  Meclizine 25 mg one-half to one tablet p.o. q.6h. p.r.n. for dizziness.  3.  The patient is instructed to hold Flomax until further evaluation with      primary M.D.   Hospital follow-up is scheduled with Dr. Posey Rea for Tuesday, April 11, at  10 a.m. to review symptoms as well as reconsider medication for treatment of  BPH.   CONDITION ON DISCHARGE:  Medically stable.   HOSPITAL COURSE BY PROBLEM:  DYSEQUILIBRIUM WITH FALLS:  The patient is a pleasant 77 year old gentleman  who presented to the emergency room complaining of a two-week history of  increasing dysequilibrium.  Initial evaluation in the ER was negative but  because of risk for stroke, he was admitted for further evaluation.  MRI/MRA  were pursued, and the patient was continued on aspirin therapy.  He was  noted to be slightly orthostatic though inconsistently symptomatic, and his  MRI and MRA were negative for acute events or vertebrobasilar insufficiency.  It was felt that his dysequilibrium may be related to labyrinthitis versus  vertigo  versus orthostatics.  The patient was instructed to discontinue his  Flomax, may continue with meclizine as this has shown some improvement, and  otherwise outpatient follow-up with primary M.D.  A 2-D echo was also  unremarkable with normal LV function.       VL/MEDQ  D:  08/11/2004  T:  08/12/2004  Job:  [id number]

## 2010-07-29 NOTE — H&P (Signed)
NAME:  Jason, [name] NO.:  [id number]   MEDICAL RECORD NO.:  [id number]          PATIENT TYPE:  INP   LOCATION:  3041                         FACILITY:  [facility]   PHYSICIAN:  Jason [name], M.D.  DATE OF BIRTH:  [birth year]   DATE OF ADMISSION:  01/28/2006  DATE OF DISCHARGE:                                HISTORY & PHYSICAL   HISTORY OF PRESENT ILLNESS:  Jason [name] is an 77 year old right-handed  white male born [birth year], with a history of coronary artery disease and  cerebrovascular disease in the past.  Patient comes into [facility]  emergency room as a code stroke this morning after he had noted onset of  left-sided hemisensory deficits.  This was associated with numbness in the  left face, arm, and leg.  Felt like he was dragging his left leg when he  went to the bathroom.  Patient went to bed last evening at 10:30 p.m.  Woke  up around 3:00-3:30 a.m. on the day of this admission with the deficit.  Patient went to the bathroom, went back to bed, noted that when he woke up  again this morning that the deficit had improved but not resolved.  Patient  came to the emergency room this morning.  Last time he felt normal was when  he went to bed at 10:30 p.m. the night before.  The code stroke was  cancelled.  Patient has no clear motor deficits, visual deficits.  Did note  a mild headache in the left frontal-temporal area that is now resolving.  Patient denied any slurred speech, problems with word finding.  Patient is  coming to the hospital for further evaluation.  Patient just had a coronary  artery stent placed within the last 14-20 days, and is on maximal therapy,  including Coumadin, aspirin, and Plavix.  Patient is on Coumadin for a  recent pulmonary embolism.   PAST MEDICAL HISTORY:  1. Significant for new onset left hemisensory deficit, possible      subcortical right brain thalamic infarct.  2. Coronary artery disease status post stent 2 weeks ago.  3. Hypercholesterolemia.  4. History of pulmonary embolism on Coumadin.  5. History of chronic nausea.  6. History of benign prostatic hypertrophy.  7. History of gastroesophageal reflux disease, esophageal stricture,      chronic nausea.  8. Degenerative arthritis.  9. History of anxiety.  10.History of gout.  11.History of right carotid endarterectomy in the past.  12.History of peripheral vascular disease.  13.Bilateral cataract surgery.  14.History of bilateral hydroceles.   Current medications include:  1. Flomax 0.4 mg q.h.s.  2. Coumadin 6 mg daily, with the exception of 3 mg on Wednesdays.  3. Allopurinol 100 mg daily.  4. Prilosec 20 mg a day.  5. Sublingual nitroglycerin if needed.  6. Plavix 25 mg a day.  7. Zocor 40 mg a day.  8. Aspirin 81 mg daily.   ALLERGIES:  Patient has an ALLERGY TO HYOSCYAMINE.   Does not smoke.  Drinks alcohol on occasion.   SOCIAL HISTORY:  This  patient currently resides in the [place] area.  Patient lives in an assisted living facility with his wife.  Patient is  retired.   FAMILY MEDICAL HISTORY:  Noted that patient has a brother with coronary  artery disease and his father died with an MI.   REVIEW OF SYSTEMS:  Notable for no recent fevers or chills.  Patient does  note a slight headache.  He does have some chronic neck stiffness.  Denies  chest pains at this time.  Does have some shortness of breath with minimal  exertion.  Has chronic nausea.  Some slight weight loss due to this.  Denies  any problems controlling the bowels or the bladder.  Denies any blacking out  episodes.  Does not some left-sided numbness as above.  Has not had any  speech or swallowing problems.   PHYSICAL EXAMINATION:  VITALS:  Blood pressure is 112/73.  Heart rate 71.  Respiratory rate 20.  Temperature:  Afebrile.  GENERAL:  This patient is a fairly well-developed, elderly, white male who  is alert, cooperative at the time of examination.  HEENT  EXAMINATION:  Head is atraumatic.  Eyes:  Pupils are postsurgical,  irregular.  The reactive disks are flat bilaterally.  Neck is supple.  No carotid bruits noted.  Respiratory examination is clear.  Cardiovascular examination reveals a regular rate and rhythm.  No obvious  murmurs or rubs noted.  Extremities are without significant edema.  Abdomen reveals positive bowel sounds.  Soft, nontender abdomen.  No  organomegaly or tenderness noted.  NEUROLOGIC EXAMINATION:  Cranial nerves as above.  Facial symmetry was  present.  Patient has good sensation of the face to pinprick and soft touch  bilaterally.  He has good strength of facial muscles and the muscles of the  head turning and shoulder shrug bilaterally.  Extraocular movements were  full.  Visual fields were full.  Speech is well enunciated and not aphasic.  Motor testing reveals 5/5 strength in all 4s.  Good symmetric motor tone and  strength throughout.  Sensory testing is intact to pinprick and soft touch  and vibratory sensation throughout.  Patient has good finger-nose-finger and  heel-shin bilaterally.  No drift is seen on the arms or legs.  Deep tendon  reflexes were positive and symmetric, toes are neutral bilaterally.  Patient  was nonambulating.  The laboratory values at this time are pending.   IMPRESSION:  1. New onset of left hemisensory deficit, face, arm, and leg, with some      resolution.  2. History of coronary artery disease with recent stent.  3. History of pulmonary embolism.  4. Prior right carotid endarterectomy.   This patient does have a history of cerebrovascular disease.  Patient comes  in with new onset left body deficit that is consistent with a subcortical  thalamic right brain stroke event.  Patient's deficits are purely  subjective, however.  NIH stroke scale is 0 at this time.  I will admit the  patient for observation and evaluation at this point.  Code stroke was canceled.  TPA was not given  secondary to duration of symptoms, minimal  symptoms, and the fact that the patient is on Coumadin.   PLAN:  1. Admission to [facility].  2. CT of the head through the ER.  3. Carotid Doppler study.  4. Transcranial Doppler study.  5. 2-D echocardiogram.  6. Continue aspirin, Plavix, and Coumadin.  Admission blood work is  pending.  Check an INR.  Patient is on maximal medical therapy for      secondary stroke prevention at this time.  If patient continues to do      well, he likely can go home within the next 24-36 hours.      Jason [name], M.D.  Electronically Signed     CKW/MEDQ  D:  01/28/2006  T:  01/28/2006  Job:  [id number]   cc:   Veverly Fells. Excell Seltzer, MD  Georgina Quint Plotnikov, MD

## 2010-07-29 NOTE — Consult Note (Signed)
NAME:  Jason [name], Jason [name] NO.:  [id number]   MEDICAL RECORD NO.:  [id number]          PATIENT TYPE:  INP   LOCATION:  4707                         FACILITY:  [facility]   PHYSICIAN:  Bevelyn Buckles. Bensimhon, MDDATE OF BIRTH:  Mar 15, 1919   DATE OF CONSULTATION:  01/04/2006  DATE OF DISCHARGE:  01/04/2006                                   CONSULTATION   PRIMARY CARE PHYSICIAN:  Dr. Oliver Barre.   CARDIOLOGIST:  Previously Dr. Peter Swaziland, now switching to [name]  cardiology.   CONSULTING PHYSICIAN:  Vikki Ports A. Felicity Coyer, MD   REASON FOR CONSULTATION:  Pre-syncope and chest pain.   HISTORY OF PRESENT ILLNESS:  Jason [name] is a delightful 77 year old male with  a history of coronary artery disease status post several previous  percutaneous interventions by Dr. Swaziland, the last of which is approximately  6 or more years ago.  Unfortunately, the record shows these are not readily  available.  He also has a history of previous stroke.  He is status post a  right carotid endarterectomy.  He also was admitted in May for chest pain  and lower extremity swelling and found to have bilateral pulmonary emboli  for which he was started on Coumadin.  He saw Dr. Jonny Ruiz about a week ago for  a two-hour episode of nocturnal palpitations.  He was started empirically on  Diltiazem.  He was in his usual vigorous state of health until last night,  when he after eating dinner he stood up and felt immediately lightheaded and  pre-syncopal.  He also felt somewhat short of breath and had some mild chest  discomfort.  He sat down and started feeling better.  He was brought to the  emergency room for further evaluation.  In the emergency room, he was  relatively asymptomatic.  EKG was showed normal sinus rhythm with no ST-T  wave changes.  He was admitted for observation.  During observation, he has  now had 4 sets of cardiac markers which have been totally normal.  He feels  fine and back to his normal  self.  He has not had any further chest pain.   REVIEW OF SYSTEMS:  He is quite active.  He walks frequently and also hits  golf balls without any dyspnea.  He denies any focal neurologic symptoms.  He has not had any heart failure symptoms.  No bleeding or bright red blood  per rectum.  No fevers, chills, no diarrhea or dysuria.  Remainder of review  of systems is negative, except for HPI problem list.   PROBLEM LIST:  1. Coronary artery disease status post several percutaneous coronary      angioplasties by Dr. Swaziland, the last one at least 6 years ago.      Records not available.      a.     Echocardiogram in March 2006 showed an EF of 55-65% with no       significant valvular abnormalities.  2. History of bilateral pulmonary emboli in May 2007 now on Coumadin.  3. History of CVAs.  a.     Status post right carotid endarterectomy.  4. Gastroesophageal reflux disease.  5. Esophageal stricture status post dilatation.  6. BPH.  7. Degenerative joint disease, anxiety.  8. Hyperlipidemia.   HOME MEDICATIONS:  Include:  1. Flomax 0.4 mg a day.  2. Coumadin 6 mg a day except 3 mg a day on Wednesday and Saturday.  3. Allopurinol 100 mg a day.  4. Diltiazem 240 a day.   ALLERGIES:  HYCOSAMINE.   SOCIAL HISTORY:  He lives in the [facility] with his wife who  has history of Alzheimer's.  No tobacco or alcohol.   FAMILY HISTORY:  Notable for premature coronary artery disease in his  brother, but not in his parents.   PHYSICAL EXAM:  GENERAL:  He is an elderly male in no acute distress.  He  ambulates in the room without difficulty.  VITAL SIGNS:  Respirations are unlabored.  Blood pressure is 122/73 with a  heart rate of 60.  HEENT:  Sclerae anicteric.  EOMI.  There is no xanthelasmas.  Mucous  membranes are moist.  NECK:  Supple.  There is no JVD.  Carotids are 2+ bilaterally, no bruits.  There is no lymphadenopathy or thyromegaly.  CARDIAC:  Regular rate and  rhythm.  No murmurs, rubs or gallops.  LUNGS:  Are clear.  ABDOMEN:  Soft, nontender, nondistended.  No sinus thyromegaly.  No bruits.  No masses.  Good bowel sounds.  EXTREMITIES:  Warm with no cyanosis,  clubbing or edema.  NEURO:  He is alert and oriented x3.  Cranial nerves II-XII are intact.  Moves all four extremities without difficulty.   LABS:  EKG shows a sinus rhythm at a rate of 60 with no ST T-wave changes.  Troponin is less than 0.01 x2, has also two sets of negative  point of care  markers.  Cholesterol:  Total cholesterol is 229 with an LDL of 170, HDL of  45.  Thyroid panel is normal.  Magnesium is 2.3, sodium 141, potassium 3.5,  glucose 85, creatinine 1.1.  D-dimer is less than 0.22.  INR is 3.2.   ASSESSMENT:  1. Presyncopal episode.  I suspect this is orthostatic and possibly      related to his diltiazem.  2. Chest pain, nonspecific.  Currently ruled out for myocardial      infarction.  3. Palpitations.  4. Overnight sinus bradycardia with heart rate of 45, on telemetry.  5. Severe hyperlipidemia.   PLAN/DISCUSSION:  I doubt his chest pain is ischemic, but says he is  certainly at risk for angina, given his history.  He currently appears  stable for discharge today, and will set him up normal for outpatient  adenosine Myoview tomorrow for further evaluation.  Will also have an event  monitor placed to rule out underlying atrial fibrillation.  We will stop  Diltiazem for now.  He will follow-up with Dr. Antoine Poche as scheduled in  under weeks.  Of note, would start a baby aspirin and Statin therapy,  especially given his marked hyperlipidemia.  We appreciate the consult.  Please do not hesitate to call with questions.      Bevelyn Buckles. Bensimhon, MD  Electronically Signed    DRB/MEDQ  D:  01/04/2006  T:  01/05/2006  Job:  [id number]

## 2010-08-15 ENCOUNTER — Observation Stay (HOSPITAL_COMMUNITY)
Admission: EM | Admit: 2010-08-15 | Discharge: 2010-08-16 | Disposition: A | Discharge status: Home / Self Care | Payer: Medicare Other | Attending: Cardiology | Admitting: Cardiology

## 2010-08-15 ENCOUNTER — Emergency Department (HOSPITAL_COMMUNITY): Payer: Medicare Other

## 2010-08-15 DIAGNOSIS — Z79899 Other long term (current) drug therapy: Secondary | ICD-10-CM | POA: Insufficient documentation

## 2010-08-15 DIAGNOSIS — Z86718 Personal history of other venous thrombosis and embolism: Secondary | ICD-10-CM | POA: Insufficient documentation

## 2010-08-15 DIAGNOSIS — I2510 Atherosclerotic heart disease of native coronary artery without angina pectoris: Secondary | ICD-10-CM | POA: Insufficient documentation

## 2010-08-15 DIAGNOSIS — N400 Enlarged prostate without lower urinary tract symptoms: Secondary | ICD-10-CM | POA: Insufficient documentation

## 2010-08-15 DIAGNOSIS — M109 Gout, unspecified: Secondary | ICD-10-CM | POA: Insufficient documentation

## 2010-08-15 DIAGNOSIS — K219 Gastro-esophageal reflux disease without esophagitis: Secondary | ICD-10-CM | POA: Insufficient documentation

## 2010-08-15 DIAGNOSIS — K222 Esophageal obstruction: Secondary | ICD-10-CM | POA: Insufficient documentation

## 2010-08-15 DIAGNOSIS — R0602 Shortness of breath: Secondary | ICD-10-CM | POA: Insufficient documentation

## 2010-08-15 DIAGNOSIS — E785 Hyperlipidemia, unspecified: Secondary | ICD-10-CM | POA: Insufficient documentation

## 2010-08-15 DIAGNOSIS — I498 Other specified cardiac arrhythmias: Secondary | ICD-10-CM | POA: Insufficient documentation

## 2010-08-15 DIAGNOSIS — R079 Chest pain, unspecified: Secondary | ICD-10-CM

## 2010-08-15 DIAGNOSIS — R300 Dysuria: Secondary | ICD-10-CM | POA: Insufficient documentation

## 2010-08-15 DIAGNOSIS — K449 Diaphragmatic hernia without obstruction or gangrene: Secondary | ICD-10-CM | POA: Insufficient documentation

## 2010-08-15 DIAGNOSIS — Z7982 Long term (current) use of aspirin: Secondary | ICD-10-CM | POA: Insufficient documentation

## 2010-08-15 LAB — URINALYSIS, ROUTINE W REFLEX MICROSCOPIC
Bilirubin Urine: NEGATIVE
Ketones, ur: NEGATIVE mg/dL
Nitrite: NEGATIVE
Specific Gravity, Urine: 1.018 (ref 1.005–1.030)
Urobilinogen, UA: 0.2 mg/dL (ref 0.0–1.0)
pH: 6 (ref 5.0–8.0)

## 2010-08-15 LAB — CBC
HCT: 39.6 % (ref 39.0–52.0)
Hemoglobin: 14.1 g/dL (ref 13.0–17.0)
MCH: 34.4 pg — ABNORMAL HIGH (ref 26.0–34.0)
MCHC: 35.6 g/dL (ref 30.0–36.0)
MCV: 96.6 fL (ref 78.0–100.0)
RDW: 13.1 % (ref 11.5–15.5)

## 2010-08-15 LAB — CK TOTAL AND CKMB (NOT AT ARMC)
CK, MB: 1.9 ng/mL (ref 0.3–4.0)
Relative Index: INVALID (ref 0.0–2.5)
Total CK: 23 U/L (ref 7–232)

## 2010-08-15 LAB — TROPONIN I: Troponin I: <0.3 ng/mL (ref <0.30)

## 2010-08-15 LAB — DIFFERENTIAL
Basophils Absolute: 0 10*3/uL (ref 0.0–0.1)
Eosinophils Relative: 1 % (ref 0–5)
Lymphocytes Relative: 22 % (ref 12–46)
Monocytes Absolute: 0.7 10*3/uL (ref 0.1–1.0)
Monocytes Relative: 10 % (ref 3–12)
Neutro Abs: 4.6 10*3/uL (ref 1.7–7.7)

## 2010-08-15 LAB — TSH: TSH: 3.272 u[IU]/mL (ref 0.350–4.500)

## 2010-08-15 LAB — BASIC METABOLIC PANEL
CO2: 27 mEq/L (ref 19–32)
Calcium: 8.5 mg/dL (ref 8.4–10.5)
Chloride: 103 mEq/L (ref 96–112)
Creatinine, Ser: 1.01 mg/dL (ref 0.4–1.5)
GFR calc Af Amer: >60 mL/min (ref >60)
Sodium: 139 mEq/L (ref 135–145)

## 2010-08-15 LAB — PRO B NATRIURETIC PEPTIDE: Pro B Natriuretic peptide (BNP): 276.3 pg/mL (ref 0–450)

## 2010-08-15 LAB — URINE MICROSCOPIC-ADD ON

## 2010-08-16 DIAGNOSIS — I059 Rheumatic mitral valve disease, unspecified: Secondary | ICD-10-CM

## 2010-08-16 LAB — COMPREHENSIVE METABOLIC PANEL
ALT: 11 U/L (ref 0–53)
AST: 13 U/L (ref 0–37)
Albumin: 3 g/dL — ABNORMAL LOW (ref 3.5–5.2)
CO2: 27 mEq/L (ref 19–32)
Calcium: 8.8 mg/dL (ref 8.4–10.5)
Creatinine, Ser: 1.12 mg/dL (ref 0.4–1.5)
GFR calc Af Amer: >60 mL/min (ref >60)
Sodium: 139 mEq/L (ref 135–145)
Total Protein: 6.1 g/dL (ref 6.0–8.3)

## 2010-08-16 LAB — CARDIAC PANEL(CRET KIN+CKTOT+MB+TROPI)
CK, MB: 1.8 ng/mL (ref 0.3–4.0)
CK, MB: 1.8 ng/mL (ref 0.3–4.0)
Relative Index: INVALID (ref 0.0–2.5)
Total CK: 24 U/L (ref 7–232)
Troponin I: <0.3 ng/mL (ref <0.30)

## 2010-08-16 LAB — CBC
HCT: 41.1 % (ref 39.0–52.0)
Hemoglobin: 14.4 g/dL (ref 13.0–17.0)
MCHC: 35 g/dL (ref 30.0–36.0)
RBC: 4.17 MIL/uL — ABNORMAL LOW (ref 4.22–5.81)

## 2010-08-17 ENCOUNTER — Encounter: Payer: Self-pay | Admitting: *Deleted

## 2010-08-18 ENCOUNTER — Ambulatory Visit (HOSPITAL_COMMUNITY): Payer: Medicare Other | Attending: Cardiovascular Disease | Admitting: Radiology

## 2010-08-18 VITALS — Ht 72.0 in | Wt 160.0 lb

## 2010-08-18 DIAGNOSIS — R0789 Other chest pain: Secondary | ICD-10-CM

## 2010-08-18 DIAGNOSIS — I2510 Atherosclerotic heart disease of native coronary artery without angina pectoris: Secondary | ICD-10-CM

## 2010-08-18 DIAGNOSIS — R079 Chest pain, unspecified: Secondary | ICD-10-CM

## 2010-08-18 LAB — URINE CULTURE
Colony Count: >=100000
Culture  Setup Time: 201206042147

## 2010-08-18 MED ORDER — TECHNETIUM TC 99M TETROFOSMIN IV KIT
33.0000 | PACK | Freq: Once | INTRAVENOUS | Status: AC | PRN
  Administered 2010-08-18: 33 via INTRAVENOUS

## 2010-08-18 MED ORDER — TECHNETIUM TC 99M TETROFOSMIN IV KIT
11.0000 | PACK | Freq: Once | INTRAVENOUS | Status: AC | PRN
  Administered 2010-08-18: 11 via INTRAVENOUS

## 2010-08-18 MED ORDER — REGADENOSON 0.4 MG/5ML IV SOLN
0.4000 mg | Freq: Once | INTRAVENOUS | Status: AC
  Administered 2010-08-18: 0.4 mg via INTRAVENOUS

## 2010-08-18 NOTE — Progress Notes (Signed)
[facility] SITE 3 NUCLEAR MED [address] [place] [address] [phone]  Cardiology Nuclear Med Study  TENZIN EDELMAN is a 77 y.o. male [id number] 1920-01-31   Nuclear Med Background Indication for Stress Test:  Evaluation for Ischemia, Stent Patency and Post Hospital on 08/15/10: CP,SOB, UTI History:  10/07 Stent-CFX, EF=55%;  12/07 GUY:QIHKVQ, EF= 58%; 08/16/10 Echo: EF= 55-60%, mild MR, mild TR Cardiac Risk Factors: CVA, Family History - CAD, Hypertension, Lipids, PVD and TIA  Symptoms:  Chest Tightness (last episode of chest discomfort was about four days ago), DOE/SOB, Fatigue and Nausea    Nuclear Pre-Procedure Caffeine/Decaff Intake:  None NPO After: 7:30am   Lungs:  Clear. O2 sat 98% on RA. IV 0.9% NS with Angio Cath:  20g  IV Site: R Forearm  IV Started by:  Irean Hong, RN  Chest Size (in):  40 Cup Size: n/a  Height: 6' (1.829 m)  Weight:  160 lb (72.576 kg)  BMI:  Body mass index is 21.70 kg/(m^2). Tech Comments:  N/A    Nuclear Med Study 1 or 2 day study: 1 day  Stress Test Type:  Lexiscan  Reading MD: Cassell Clement, MD  Order Authorizing Provider:  Tonny Bollman  Resting Radionuclide: Technetium 63m Tetrofosmin  Resting Radionuclide Dose: 11 mCi   Stress Radionuclide:  Technetium 56m Tetrofosmin  Stress Radionuclide Dose: 33 mCi           Stress Protocol Rest HR: 53 Stress HR: NA  Rest BP: 144/77 Stress BP: NA  Exercise Time (min): n/a METS: n/a   Predicted Max HR: 132 bpm % Max HR: 61.36 bpm Rate Pressure Product: 25956   Dose of Adenosine (mg):  n/a Dose of Lexiscan: 0.4 mg  Dose of Atropine (mg): n/a Dose of Dobutamine: n/a mcg/kg/min (at max HR)  Stress Test Technologist: Stanton Kidney, EMT-P  Nuclear Technologist:  Domenic Polite, CNMT     Rest Procedure:  Myocardial perfusion imaging was performed at rest 45 minutes following the intravenous administration of Technetium 47m Tetrofosmin.  Rest ECG: Sinus Huston Foley  Stress  Procedure:  The patient received IV Lexiscan 0.4 mg over 15-seconds.  Technetium 34m Tetrofosmin injected at 30-seconds.  There were no significant changes with Lexiscan; rare PAC.  Quantitative spect images were obtained after a 45 minute delay.  Stress ECG: No significant change from baseline ECG  QPS Raw Data Images:  Normal; no motion artifact; normal heart/lung ratio. Stress Images:  Normal homogeneous uptake in all areas of the myocardium. Rest Images:  Normal homogeneous uptake in all areas of the myocardium. Subtraction (SDS):  No evidence of ischemia. Transient Ischemic Dilatation (Normal <1.22): 1.05 Lung/Heart Ratio (Normal <0.45):  .10  Quantitative Gated Spect Images QGS EDV:  83 ml QGS ESV:  28 ml QGS cine images:  NL LV Function; NL Wall Motion QGS EF: 66%  Impression Exercise Capacity:  Lexiscan with no exercise. BP Response:  Normal blood pressure response. Clinical Symptoms:  No chest pain. ECG Impression:  No significant ST segment change suggestive of ischemia. Comparison with Prior Nuclear Study: No significant change from previous study  Overall Impression:  Normal stress nuclear study.    Cassell Clement

## 2010-08-19 NOTE — Progress Notes (Signed)
COPY ROUTED TO DR. COOPER.Falecha L Clark ° °

## 2010-08-23 NOTE — Progress Notes (Signed)
Normal study noted. The study was performed to followup inpatient hospitalization for chest pain.

## 2010-08-24 NOTE — Progress Notes (Signed)
Pt's daughter aware of results by phone.  The pt has a pending appointment with Dr Excell Seltzer on 09/02/10.

## 2010-08-31 NOTE — H&P (Signed)
NAME:  Jason [name], ASHURST NO.:  [id number]  MEDICAL RECORD NO.:  [id number]  LOCATION:  2001                         FACILITY:  [facility]  PHYSICIAN:  Rollene Rotunda, MD, FACCDATE OF BIRTH:  October 13, 1919  DATE OF ADMISSION:  08/15/2010 DATE OF DISCHARGE:                             HISTORY & PHYSICAL   PRIMARY:  Dr. Tanya Nones  CARDIOLOGIST:  Veverly Fells. Excell Seltzer, MD  REASON OF PRESENTATION:  Evaluate the patient with chest pain.  HISTORY OF PRESENT ILLNESS:  The patient is a very pleasant 77 year old gentleman with a history of coronary artery disease as described below. He reports several weeks of increasing dyspnea on exertion.  He has been getting short of breath, just recently walking around level ground in his backyard.  This has been slowly progressive.  He is not describing any PND or orthopnea.  He has had no weight gain or edema.  He has had no fevers, chills, or cough.  He did have some chest discomfort today. This is moderate.  It was left sided under his left breast.  There was some tingling and heaviness in his left arm.  He thought this was somewhat similar to symptoms he had in 2007 at the time of his catheterization.  He did not take anything to relieve this.  He did not have any associated nausea, vomiting, or diaphoresis.  He has had no palpitations, presyncope, or syncope.  Labs in the ER pending.  Chest x- ray suggests for questionable retrocardiac opacity with questionable early pneumonia.  His EKG demonstrated no acute changes.  PAST MEDICAL HISTORY: 1. Coronary artery disease (catheterization in 2007, LAD 40% stenosis,     small diagonal 80% stenosis, OM-2 90% stenosis treated with bare     metal stent in 1007, right coronary artery 50% stenosis, normal     EF). 2. Deep venous thrombosis. 3. Hyperlipidemia. 4. Esophageal strictures. 5. Benign prostatic hypertrophy. 6. Gastroesophageal reflux disease/hiatal hernia. 7. Subdural hematoma, status  post fall in January 2011. 8. Gout. 9. Bradycardia. 10.Previous small bowel obstruction.  PAST SURGICAL HISTORY:  Right carotid endarterectomy, cataract surgery.  ALLERGIES: 1. AUGMENTIN. 2. FLUDROCORTISONE. 3. VALIUM. 4. SCOPOLAMINE PATCH.  MEDICATIONS: 1. Dexilant 60 mg daily. 2. Vitamin D. 3. Allopurinol. 4. Xanax 0.25 mg nightly. 5. Flomax. 6. Robitussin. 7. Hydrocodone. 8. Meclizine. 9. Phenergan. 10.MiraLax. 11.Aspirin 81 mg daily.  SOCIAL HISTORY:  The patient actually lives at home with his wife who has Alzheimer's.  They were staying at [facility], but they moved out of there.  He has not smoked cigarettes and does not drink alcohol.  His wife has 24x7 care.  FAMILY HISTORY:  Noncontributory with his advanced age.  REVIEW OF SYSTEMS:  Positive for recent impaction, constipation, and unsteady gait, otherwise as stated in the HPI and negative for all other systems.  PHYSICAL EXAMINATION:  GENERAL:  The patient is pleasant and in no distress. VITAL SIGNS:  Blood pressure 112/70, heart rate 58 and regular, afebrile, and respiratory rate 15. HEENT:  Eyelids are unremarkable.  Pupils are equal, round, and reactive to light.  Fundi not visualized.  Oral mucosa unremarkable. NECK:  No jugular venous distention at 45  degrees.  Carotid upstroke brisk and symmetrical.  Bilateral bruits, right carotid endarterectomy scar. LYMPHATICS:  No cervical, axillary, or inguinal adenopathy. LUNGS:  Clear to auscultation bilaterally. BACK:  No costovertebral angle tenderness. CHEST:  Unremarkable. HEART:  PMI not displaced or sustained.  S1 and S2 within normal.  No S3, no S4.  Soft 2/6 apical systolic murmur.  No diastolic murmurs. ABDOMEN:  Flat, positive bowel sounds normal in frequency and pitch.  No bruits, rebound, no guarding, no midline pulsatile mass.  No hepatomegaly or splenomegaly. SKIN:  No rashes, no nodules. EXTREMITIES:  2+ pulses throughout, mild bilateral  ankle edema. NEURO:  Oriented to person, place, and time.  Cranial nerves II-XII grossly intact.  Motor grossly intact.  LABORATORY DATA:  Pending.  Chest x-ray:  Questionable retrocardiac opacity with developing pneumonia.  EKG:  Sinus rhythm, rate 76, axis within normal limits, intervals within normal limits, RSR prime V1-V2, no acute ST-T wave changes.  ASSESSMENT/PLAN: 1. Chest pain:  The patient's chest discomfort is somewhat atypical,     but reminiscent to previous angina.  Labs are pending, however, the     EKG demonstrates no acute abnormality.  At this point, I do not     have a strong suspicion for unstable angina.  The predominant     complaint has been dyspnea.  He needs to be worked up with a BNP     level and an echocardiogram.  He will have serial enzymes and     repeat EKG in the morning.  He might need stress perfusion imaging     or catheterization if he has objective evidence of ischemia.  At     this point, I do not have a strong suspicion for pneumonia based on     clinical history. 2. Urinary tract infection:  The patient has had some frequent     urination.  He does have bacteria on his UA and we will treat him     with ciprofloxacin. 3. Bradycardia:  Beta-blocker should be avoided given this past     history. 4. 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  I will defer to Dr.     Excell Seltzer.     Rollene Rotunda, MD, [facility]     JH/MEDQ  D:  08/15/2010  T:  08/16/2010  Job:  [id number]  Electronically Signed by Rollene Rotunda MD [facility] on 08/31/2010 11:45:20 AM

## 2010-09-01 ENCOUNTER — Encounter: Payer: Self-pay | Admitting: Cardiovascular Disease

## 2010-09-02 ENCOUNTER — Encounter: Payer: Self-pay | Admitting: Cardiovascular Disease

## 2010-09-02 ENCOUNTER — Ambulatory Visit (INDEPENDENT_AMBULATORY_CARE_PROVIDER_SITE_OTHER): Payer: Medicare Other | Admitting: Cardiovascular Disease

## 2010-09-02 DIAGNOSIS — I10 Essential (primary) hypertension: Secondary | ICD-10-CM

## 2010-09-02 DIAGNOSIS — I2510 Atherosclerotic heart disease of native coronary artery without angina pectoris: Secondary | ICD-10-CM

## 2010-09-02 NOTE — Patient Instructions (Signed)
Your physician wants you to follow-up in: 1 year  You will receive a reminder letter in the mail two months in advance. If you don't receive a letter, please call our office to schedule the follow-up appointment.  Your physician recommends that you continue on your current medications as directed. Please refer to the Current Medication list given to you today.  

## 2010-09-14 ENCOUNTER — Encounter: Payer: Self-pay | Admitting: Cardiovascular Disease

## 2010-09-14 NOTE — Assessment & Plan Note (Signed)
Blood pressure is well controlled on current medical therapy. 

## 2010-09-14 NOTE — Assessment & Plan Note (Signed)
The patient is stable without angina. His stress test was reviewed and it is reassuring. The patient will continue on his current medical program.

## 2010-09-14 NOTE — Progress Notes (Signed)
HPI:  This is an 77 year old gentleman returning for followup evaluation. The patient has a history of coronary artery disease and underwent bare-metal stenting of left circumflex in 2008. He also has a history of DVT/PE and was on Coumadin for some time. He developed recurrent falls and I elected to take him off of Coumadin over one year ago. He has had no recurrent problem with thromboembolism. He was recently hospitalized with chest pain. The patient ruled out for MI and had no recurrent symptoms. He was discharged home and underwent a Lexiscan rest stress Myoview scan. This demonstrated normal LV function with an ejection fraction of 66% and no ischemia.  The patient is doing well and he has no complaints today. He specifically denies chest pain, dyspnea, edema, palpitations, or lightheadedness.   Outpatient Encounter Prescriptions as of 09/02/2010  Medication Sig Dispense Refill  . allopurinol (ZYLOPRIM) 100 MG tablet Take 100 mg by mouth daily.        [name] ALPRAZolam (XANAX) 0.25 MG tablet Take 0.25 mg by mouth at bedtime as needed.        [name] amLODipine (NORVASC) 10 MG tablet Take 10 mg by mouth daily.        [name] aspirin 81 MG tablet Take 81 mg by mouth daily.        . Cholecalciferol (VITAMIN D3) 1000 UNITS CAPS Take 1 capsule by mouth daily.        [name] dexlansoprazole (DEXILANT) 60 MG capsule Take 1 by mouth 30 mins before breakfast       . Docusate Calcium (STOOL SOFTENER PO) as needed.        [name] HYDROcodone-acetaminophen (VICODIN ES) 7.5-750 MG per tablet Take 1 tablet by mouth every 6 (six) hours as needed.        . meclizine (ANTIVERT) 25 MG tablet Take 25 mg by mouth 3 (three) times daily as needed.        . nitroGLYCERIN (NITROSTAT) 0.4 MG SL tablet Place 0.4 mg under the tongue every 5 (five) minutes as needed.        . polyethylene glycol powder (MIRALAX) powder Take 17 g by mouth daily as needed.        . promethazine (PHENERGAN) 25 MG tablet Take 25 mg by mouth every 6 (six) hours as needed.         . promethazine (PHENERGAN) 25 MG/ML injection Inject into the vein every 6 (six) hours as needed.        . Tamsulosin HCl (FLOMAX) 0.4 MG CAPS Take 0.4 mg by mouth daily.        [name] DISCONTD: hyoscyamine (LEVSIN, ANASPAZ) 0.125 MG tablet Take 0.125-0.25 mg by mouth every 4 (four) hours as needed.        [name] DISCONTD: ondansetron (ZOFRAN) 4 MG tablet Take 4 mg by mouth every 6 (six) hours as needed.        [name] DISCONTD: triamterene-hydrochlorothiazide (DYAZIDE) 37.5-25 MG per capsule Take 0.5 capsules by mouth daily.          Allergies  Allergen Reactions  . Diazepam     REACTION: weak  . Fludrocortisone Acetate   . YNW:GNFAOZHYQMV+HQIONGEXB+MWUXLKGMWN Acid+Aspartame   . Transdermal Base (Pcca Solid Transdermal)     Past Medical History  Diagnosis Date  . PVD (peripheral vascular disease)   . CAD (coronary artery disease)   . Hyperlipidemia   . Cerebrovascular accident   . DVT femoral (deep venous thrombosis) with thrombophlebitis   . GERD (gastroesophageal reflux disease)   .  Edema   . Anxiety   . Vertigo   . IBS (irritable bowel syndrome)   . Duodenitis   . Gastritis   . Esophageal stricture   . Cellulitis   . Pulmonary embolism   . Prostatic hypertrophy   . Hiatal hernia   . Frequent falls   . Subdural hematoma     ROS: Negative except as per HPI  BP 128/72  Pulse 68  Resp 16  Ht 5\' 7"  (1.702 m)  Wt 165 lb (74.844 kg)  BMI 25.84 kg/m2  PHYSICAL EXAM: Pt is alert and oriented, elderly male in NAD HEENT: normal Neck: JVP - normal, carotids 2+= without bruits Lungs: CTA bilaterally CV: RRR without murmur or gallop Abd: soft, NT, Positive BS, no hepatomegaly Ext: no C/C/E, distal pulses intact and equal Skin: warm/dry no rash  ASSESSMENT AND PLAN:

## 2010-09-29 NOTE — Discharge Summary (Signed)
NAME:  Jason [name], Jason [name] NO.:  [id number]  MEDICAL RECORD NO.:  [id number]  LOCATION:  2001                         FACILITY:  [facility]  PHYSICIAN:  Veverly Fells. Excell Seltzer, MD  DATE OF BIRTH:  [birth year]  DATE OF ADMISSION:  08/15/2010 DATE OF DISCHARGE:  08/16/2010                              DISCHARGE SUMMARY   PRIMARY CARDIOLOGIST:  Veverly Fells. Excell Seltzer, MD  PRIMARY CARE PROVIDER:  Dr. Tanya Nones.  DISCHARGE DIAGNOSIS:  Chest pain without objective evidence of ischemia.  SECONDARY DIAGNOSES: 1. Coronary artery disease status post prior bare-metal stenting to     the second obtuse marginal in 2007. 2. History of deep vein thrombosis. 3. Hyperlipidemia. 4. Esophageal strictures. 5. Benign prostatic hypertrophy. 6. Gastroesophageal reflux disease. 7. Hiatal hernia. 8. History of subdural hematoma status post fall in January 2009. 9. Gout. 10.History of bradycardia. 11.Previous small bowel obstruction. 12.Status post right carotid endarterectomy. 13.Status post cataract surgery.  ALLERGIES:  AUGMENTIN, FLUDROCORTISONE, VALIUM, SCOPOLAMINE PATCH.  PROCEDURES:  A 2D echocardiogram performed on August 16, 2010, revealing an EF of 55-60% with trivial aortic insufficiency and mild mitral regurgitation.  Right ventricle cavitary size was normal with normal systolic function.  Pulmonic valve was grossly normal.  Mild tricuspid regurgitation.  HISTORY OF PRESENT ILLNESS:  An 77 year old male with prior history of coronary artery disease status post prior bare-metal stenting in 2007, who was in his usual state of health until several weeks ago when he began to experience dyspnea on exertion which has been slowly progressive.  He has had no weight gain, PND, orthopnea, or edema.  On the day of admission, the patient felt left-sided moderate chest discomfort under his left breast, associated with tingling and heaviness in his left arm, thought these symptoms were somewhat  similar to prior anginal symptoms.  Symptoms resolved spontaneously.  He presented to [facility] ED for evaluation.  In the emergency department, his point-of-care markers were negative.  An ECG showed no acute changes, admitted for evaluation.  HOSPITAL COURSE:  The patient ruled out for an MI.  He has had no recurrence of left-sided chest discomfort.  A 2D echocardiogram was undertaken, showing normal LV function.  He did have slightly elevated D- dimer at 0.80; however, as he has been bradycardic without any recurrent symptoms and echocardiogram shows normal RV systolic function, fairly significant right-sided changes, we will not pursue CT of his chest at this time.  He will be discharged home today in good condition.  We have arranged for an outpatient Lexiscan Myoview later this week.  The patient reported dysuria.  Urinalysis did show many bacteria, small leukocytes, and he has been placed on Cipro 250 mg q.12 hours, will plan to continue this for course of 7 days.  At this time, urine culture is pending.  Further, the patient's chest x-ray on admission showed mild retrocardiac opacity which may represent atelectasis or developing pneumonia.  He has been afebrile throughout admission and his white blood cell counts have been normal.  DISCHARGE LABS:  Hemoglobin 14.4, hematocrit 41.1, WBC 6.6, platelets 193.  D-dimer 0.0.  Sodium 139, potassium 3.6, chloride 103, CO2 of 27, BUN 22, creatinine 1.12,  glucose 130.  Total bilirubin 0.6, alkaline phosphatase 71, AST 13, ALT 11, total protein 6.1, albumin 3.0, calcium 8.8.  CK 22, MB 11.8, troponin I less than 0.30.  TSH 3.272.  Urinalysis showed hazy urine with small leukocytes, many bacteria.  Urine culture is currently pending.  DISPOSITION:  The patient is being discharged home today in good condition.  FOLLOWUP PLANS AND APPOINTMENTS:  The patient will undergo Lexiscan Myoview on August 18, 2010, at 12 noon.  Follow up with Dr. Excell Seltzer  on September 02, 2010, at 9:30 a.m.  Follow up with Dr. Tanya Nones as previously scheduled.  DISCHARGE MEDICATIONS: 1. Cipro 250 mg b.i.d. 2. Alprazolam 0.25 mg t.i.d. and at bedtime p.r.n. 3. Ambien 10 mg at bedtime. 4. Aspirin 81 mg daily. 5. Allopurinol 100 mg daily. 6. Cortisporin HC 4 drops left ear q.i.d. x10 days as previously     prescribed. 7. Dexilant 60 mg daily. 8. Hydrocodone/APAP 5/325 mg q.4-6 hours p.r.n. 9. Meclizine 12.5 mg q.6 hours p.r.n. 10.MiraLax 17 g at bedtime p.r.n. 11.Nitroglycerin 0.4 mg sublingual p.r.n. chest pain. 12.Norvasc 10 mg daily. 13.Promethazine 25 mg q.6 hours p.r.n. 14.Tylenol Extra Strength 500 mg 2 tabs q.4 hours p.r.n. 15.Tamsulosin 0.4 mg daily. 16.Vitamin D3 1000 units daily.  OUTSTANDING LAB STUDIES:  Followup Lexiscan Myoview as outlined.  DURATION OF DISCHARGE ENCOUNTER:  45 minutes including physician time.     Nicolasa Ducking, ANP   ______________________________ Veverly Fells. Excell Seltzer, MD    CB/MEDQ  D:  08/16/2010  T:  08/17/2010  Job:  [id number]  cc:   Dr. Tanya Nones  Electronically Signed by Nicolasa Ducking ANP on 08/24/2010 04:23:38 PM Electronically Signed by Tonny Bollman MD on 09/29/2010 12:10:18 AM

## 2010-10-04 ENCOUNTER — Ambulatory Visit (INDEPENDENT_AMBULATORY_CARE_PROVIDER_SITE_OTHER): Payer: Medicare Other | Admitting: Internal Medicine

## 2010-10-04 ENCOUNTER — Encounter: Payer: Self-pay | Admitting: Internal Medicine

## 2010-10-04 VITALS — BP 126/62 | HR 60 | Ht 70.0 in | Wt 171.0 lb

## 2010-10-04 DIAGNOSIS — K589 Irritable bowel syndrome without diarrhea: Secondary | ICD-10-CM

## 2010-10-04 DIAGNOSIS — K5909 Other constipation: Secondary | ICD-10-CM | POA: Insufficient documentation

## 2010-10-04 DIAGNOSIS — K5900 Constipation, unspecified: Secondary | ICD-10-CM

## 2010-10-04 NOTE — Progress Notes (Signed)
  Subjective:    Patient ID: Jason [name], male    DOB: 03-06-1920, 77 y.o.   MRN: [id number]  HPI He has had recurrent bloating in the past month or so but is ok now His daughter indicates that he was on meclizine more frequently than in the past due due to dizziness - and since then has reduced back to as needed from daily. And since then he has felt better. He has been on MiraLax since a visit to the hospital for fecal impactions and constipation, and it has been successful but he has transiently been using it prn some. Still uses promethazine a fair amount with relief.  Review of Systems     Objective:   Physical Exam Soft and nontender abdomen, BS+       Assessment & Plan:

## 2010-10-04 NOTE — Assessment & Plan Note (Signed)
We will continue his MiraLax but reduce the dose but still take it daily. Hopefully we'll have less problems with defecation but does maintain a good pattern overall. This was reviewed with the patient and his daughter.

## 2010-10-04 NOTE — Patient Instructions (Signed)
Try a different dose of the MiraLax - a half dose which would be a half a cap in 6 oz of liquid or a half tablespoon. You may increase this or decrease this to achieve desired results but it seems like you should take this daily. I think the meclizine likely caused the bloating problems. If that returns and is a problem let us know.

## 2010-10-04 NOTE — Assessment & Plan Note (Signed)
He is doing well overall. I think his recent bloating problems could very well been due to the regular meclizine use. At any rate he feels as okay, and at baseline and so nothing different. He should continue the MiraLax for his constipation issues.

## 2010-11-13 ENCOUNTER — Emergency Department (HOSPITAL_COMMUNITY)
Admission: EM | Admit: 2010-11-13 | Discharge: 2010-11-14 | Disposition: A | Discharge status: Home / Self Care | Payer: Medicare Other | Attending: Emergency Medicine | Admitting: Emergency Medicine

## 2010-11-13 ENCOUNTER — Emergency Department (HOSPITAL_COMMUNITY): Payer: Medicare Other

## 2010-11-13 DIAGNOSIS — Z86718 Personal history of other venous thrombosis and embolism: Secondary | ICD-10-CM | POA: Insufficient documentation

## 2010-11-13 DIAGNOSIS — I252 Old myocardial infarction: Secondary | ICD-10-CM | POA: Insufficient documentation

## 2010-11-13 DIAGNOSIS — R0602 Shortness of breath: Secondary | ICD-10-CM | POA: Insufficient documentation

## 2010-11-13 DIAGNOSIS — I2510 Atherosclerotic heart disease of native coronary artery without angina pectoris: Secondary | ICD-10-CM | POA: Insufficient documentation

## 2010-11-13 DIAGNOSIS — I10 Essential (primary) hypertension: Secondary | ICD-10-CM | POA: Insufficient documentation

## 2010-11-13 DIAGNOSIS — R109 Unspecified abdominal pain: Secondary | ICD-10-CM | POA: Insufficient documentation

## 2010-11-13 LAB — COMPREHENSIVE METABOLIC PANEL
AST: 17 U/L (ref 0–37)
BUN: 18 mg/dL (ref 6–23)
CO2: 23 mEq/L (ref 19–32)
Chloride: 105 mEq/L (ref 96–112)
Creatinine, Ser: 1.36 mg/dL — ABNORMAL HIGH (ref 0.50–1.35)
GFR calc non Af Amer: 49 mL/min — ABNORMAL LOW (ref >60)
Glucose, Bld: 73 mg/dL (ref 70–99)
Total Bilirubin: 0.3 mg/dL (ref 0.3–1.2)

## 2010-11-13 LAB — PRO B NATRIURETIC PEPTIDE: Pro B Natriuretic peptide (BNP): 209.8 pg/mL (ref 0–450)

## 2010-11-13 LAB — URINALYSIS, ROUTINE W REFLEX MICROSCOPIC
Hgb urine dipstick: NEGATIVE
Protein, ur: NEGATIVE mg/dL
Urobilinogen, UA: 0.2 mg/dL (ref 0.0–1.0)

## 2010-11-13 LAB — CBC
HCT: 42.1 % (ref 39.0–52.0)
MCH: 33.6 pg (ref 26.0–34.0)
MCV: 97.7 fL (ref 78.0–100.0)
Platelets: 174 10*3/uL (ref 150–400)
RDW: 13.8 % (ref 11.5–15.5)

## 2010-11-13 LAB — DIFFERENTIAL
Eosinophils Absolute: 0.2 10*3/uL (ref 0.0–0.7)
Eosinophils Relative: 4 % (ref 0–5)
Lymphs Abs: 1.9 10*3/uL (ref 0.7–4.0)
Monocytes Relative: 10 % (ref 3–12)

## 2010-11-13 MED ORDER — XENON XE 133 GAS
9.0000 | GAS_FOR_INHALATION | Freq: Once | RESPIRATORY_TRACT | Status: AC | PRN
  Administered 2010-11-13: 9 via RESPIRATORY_TRACT

## 2010-11-13 MED ORDER — TECHNETIUM TO 99M ALBUMIN AGGREGATED
5.5000 | Freq: Once | INTRAVENOUS | Status: AC | PRN
  Administered 2010-11-13: 6 via INTRAVENOUS

## 2010-11-30 LAB — I-STAT 8, (EC8 V) (CONVERTED LAB)
Bicarbonate: 26.8 — ABNORMAL HIGH
Glucose, Bld: 101 — ABNORMAL HIGH
TCO2: 28
pCO2, Ven: 45.5
pH, Ven: 7.378 — ABNORMAL HIGH

## 2010-11-30 LAB — DIFFERENTIAL
Basophils Absolute: 0
Basophils Relative: 1
Eosinophils Absolute: 0.1
Eosinophils Relative: 1
Lymphocytes Relative: 24

## 2010-11-30 LAB — PROTIME-INR: Prothrombin Time: 28 — ABNORMAL HIGH

## 2010-11-30 LAB — CBC
HCT: 43.5
MCV: 101.5 — ABNORMAL HIGH
Platelets: 187
RDW: 14.2

## 2010-12-12 LAB — DIFFERENTIAL
Eosinophils Absolute: 0.1
Eosinophils Relative: 1
Lymphocytes Relative: 17
Lymphs Abs: 1.3
Monocytes Relative: 10
Neutrophils Relative %: 72

## 2010-12-12 LAB — POCT CARDIAC MARKERS
Myoglobin, poc: 87.9
Troponin i, poc: <0.05

## 2010-12-12 LAB — PROTIME-INR: INR: 3.3 — ABNORMAL HIGH

## 2010-12-12 LAB — CBC
HCT: 40.5
MCV: 102.1 — ABNORMAL HIGH
RBC: 3.96 — ABNORMAL LOW
WBC: 7.4

## 2010-12-12 LAB — POCT I-STAT, CHEM 8
BUN: 17
Calcium, Ion: 1.2
Chloride: 107
Potassium: 4.4

## 2010-12-15 LAB — URINALYSIS, ROUTINE W REFLEX MICROSCOPIC
Glucose, UA: NEGATIVE mg/dL
Hgb urine dipstick: NEGATIVE
Ketones, ur: NEGATIVE mg/dL
Protein, ur: NEGATIVE mg/dL
pH: 7 (ref 5.0–8.0)

## 2010-12-15 LAB — OCCULT BLOOD X 1 CARD TO LAB, STOOL: Fecal Occult Bld: NEGATIVE

## 2010-12-15 LAB — DIFFERENTIAL
Basophils Absolute: 0 10*3/uL (ref 0.0–0.1)
Basophils Relative: 0 % (ref 0–1)
Eosinophils Absolute: 0.1 10*3/uL (ref 0.0–0.7)
Eosinophils Relative: 1 % (ref 0–5)
Monocytes Absolute: 0.3 10*3/uL (ref 0.1–1.0)

## 2010-12-15 LAB — COMPREHENSIVE METABOLIC PANEL
ALT: 21 U/L (ref 0–53)
AST: 27 U/L (ref 0–37)
Albumin: 3.3 g/dL — ABNORMAL LOW (ref 3.5–5.2)
Alkaline Phosphatase: 52 U/L (ref 39–117)
CO2: 22 mEq/L (ref 19–32)
Chloride: 105 mEq/L (ref 96–112)
Creatinine, Ser: 1.17 mg/dL (ref 0.4–1.5)
GFR calc Af Amer: >60 mL/min (ref >60)
GFR calc non Af Amer: 59 mL/min — ABNORMAL LOW (ref >60)
Potassium: 3.7 mEq/L (ref 3.5–5.1)
Sodium: 137 mEq/L (ref 135–145)
Total Bilirubin: 0.9 mg/dL (ref 0.3–1.2)

## 2010-12-15 LAB — CBC
MCV: 102.8 fL — ABNORMAL HIGH (ref 78.0–100.0)
Platelets: 150 10*3/uL (ref 150–400)
RBC: 4.57 MIL/uL (ref 4.22–5.81)
WBC: 5.5 10*3/uL (ref 4.0–10.5)

## 2010-12-15 LAB — LIPASE, BLOOD: Lipase: 19 U/L (ref 11–59)

## 2010-12-15 LAB — POCT CARDIAC MARKERS: Myoglobin, poc: 83.8 ng/mL (ref 12–200)

## 2010-12-23 LAB — COMPREHENSIVE METABOLIC PANEL
Albumin: 3.2 — ABNORMAL LOW
Alkaline Phosphatase: 49
BUN: 15
Creatinine, Ser: 1.22
Glucose, Bld: 88
Potassium: 4.1
Total Protein: 6.2

## 2010-12-23 LAB — CBC
HCT: 46.3
HCT: 46.6
Hemoglobin: 15.8
MCHC: 33.9
MCHC: 34.2
MCV: 100.8 — ABNORMAL HIGH
MCV: 101 — ABNORMAL HIGH
Platelets: 145 — ABNORMAL LOW
RBC: 4.6
RDW: 14
WBC: 6.1

## 2010-12-23 LAB — DIFFERENTIAL
Basophils Relative: 0
Eosinophils Absolute: 0.1
Eosinophils Relative: 2
Lymphs Abs: 1.4
Monocytes Relative: 8

## 2010-12-23 LAB — URINALYSIS, ROUTINE W REFLEX MICROSCOPIC
Bilirubin Urine: NEGATIVE
Glucose, UA: NEGATIVE
Hgb urine dipstick: NEGATIVE
Protein, ur: NEGATIVE
Specific Gravity, Urine: 1.013

## 2010-12-23 LAB — PROTIME-INR
INR: 3.3 — ABNORMAL HIGH
Prothrombin Time: 35 — ABNORMAL HIGH

## 2010-12-23 LAB — POCT CARDIAC MARKERS
CKMB, poc: 2.3
Myoglobin, poc: 94.1

## 2010-12-23 LAB — LIPASE, BLOOD: Lipase: 25

## 2010-12-23 LAB — OCCULT BLOOD X 1 CARD TO LAB, STOOL: Fecal Occult Bld: NEGATIVE

## 2011-01-23 ENCOUNTER — Encounter (HOSPITAL_COMMUNITY): Payer: Self-pay | Admitting: *Deleted

## 2011-01-23 ENCOUNTER — Emergency Department (HOSPITAL_COMMUNITY)
Admission: EM | Admit: 2011-01-23 | Discharge: 2011-01-23 | Disposition: A | Discharge status: Home / Self Care | Payer: Medicare Other | Attending: Emergency Medicine | Admitting: Emergency Medicine

## 2011-01-23 ENCOUNTER — Emergency Department (HOSPITAL_COMMUNITY): Payer: Medicare Other

## 2011-01-23 ENCOUNTER — Other Ambulatory Visit: Payer: Self-pay

## 2011-01-23 DIAGNOSIS — R630 Anorexia: Secondary | ICD-10-CM | POA: Insufficient documentation

## 2011-01-23 DIAGNOSIS — R142 Eructation: Secondary | ICD-10-CM | POA: Insufficient documentation

## 2011-01-23 DIAGNOSIS — Z86718 Personal history of other venous thrombosis and embolism: Secondary | ICD-10-CM | POA: Insufficient documentation

## 2011-01-23 DIAGNOSIS — I2510 Atherosclerotic heart disease of native coronary artery without angina pectoris: Secondary | ICD-10-CM | POA: Insufficient documentation

## 2011-01-23 DIAGNOSIS — R109 Unspecified abdominal pain: Secondary | ICD-10-CM | POA: Insufficient documentation

## 2011-01-23 DIAGNOSIS — R197 Diarrhea, unspecified: Secondary | ICD-10-CM | POA: Insufficient documentation

## 2011-01-23 DIAGNOSIS — Z8673 Personal history of transient ischemic attack (TIA), and cerebral infarction without residual deficits: Secondary | ICD-10-CM | POA: Insufficient documentation

## 2011-01-23 DIAGNOSIS — I739 Peripheral vascular disease, unspecified: Secondary | ICD-10-CM | POA: Insufficient documentation

## 2011-01-23 DIAGNOSIS — R141 Gas pain: Secondary | ICD-10-CM | POA: Insufficient documentation

## 2011-01-23 DIAGNOSIS — E860 Dehydration: Secondary | ICD-10-CM

## 2011-01-23 DIAGNOSIS — H9190 Unspecified hearing loss, unspecified ear: Secondary | ICD-10-CM | POA: Insufficient documentation

## 2011-01-23 LAB — OCCULT BLOOD, POC DEVICE: Fecal Occult Bld: NEGATIVE

## 2011-01-23 LAB — DIFFERENTIAL
Basophils Absolute: 0 10*3/uL (ref 0.0–0.1)
Basophils Relative: 1 % (ref 0–1)
Eosinophils Absolute: 0.2 10*3/uL (ref 0.0–0.7)
Eosinophils Relative: 3 % (ref 0–5)
Neutrophils Relative %: 59 % (ref 43–77)

## 2011-01-23 LAB — CBC
MCH: 34.3 pg — ABNORMAL HIGH (ref 26.0–34.0)
MCHC: 34.9 g/dL (ref 30.0–36.0)
MCV: 98.1 fL (ref 78.0–100.0)
Platelets: 190 10*3/uL (ref 150–400)
RDW: 13.5 % (ref 11.5–15.5)

## 2011-01-23 LAB — COMPREHENSIVE METABOLIC PANEL
ALT: 14 U/L (ref 0–53)
AST: 15 U/L (ref 0–37)
Albumin: 3.5 g/dL (ref 3.5–5.2)
Calcium: 9.2 mg/dL (ref 8.4–10.5)
GFR calc Af Amer: 63 mL/min — ABNORMAL LOW (ref >90)
Glucose, Bld: 102 mg/dL — ABNORMAL HIGH (ref 70–99)
Sodium: 138 mEq/L (ref 135–145)
Total Protein: 6.9 g/dL (ref 6.0–8.3)

## 2011-01-23 LAB — URINALYSIS, ROUTINE W REFLEX MICROSCOPIC
Hgb urine dipstick: NEGATIVE
Nitrite: NEGATIVE
Specific Gravity, Urine: 1.01 (ref 1.005–1.030)
Urobilinogen, UA: 0.2 mg/dL (ref 0.0–1.0)
pH: 5 (ref 5.0–8.0)

## 2011-01-23 MED ORDER — ONDANSETRON HCL 4 MG/2ML IJ SOLN
4.0000 mg | Freq: Once | INTRAMUSCULAR | Status: AC
  Administered 2011-01-23: 4 mg via INTRAVENOUS
  Filled 2011-01-23: qty 2

## 2011-01-23 MED ORDER — SODIUM CHLORIDE 0.9 % IV BOLUS (SEPSIS)
1000.0000 mL | Freq: Once | INTRAVENOUS | Status: AC
  Administered 2011-01-23: 1000 mL via INTRAVENOUS

## 2011-01-23 MED ORDER — IOHEXOL 300 MG/ML  SOLN
100.0000 mL | Freq: Once | INTRAMUSCULAR | Status: AC | PRN
  Administered 2011-01-23: 100 mL via INTRAVENOUS

## 2011-01-23 MED ORDER — MORPHINE SULFATE 4 MG/ML IJ SOLN
4.0000 mg | Freq: Once | INTRAMUSCULAR | Status: AC
  Administered 2011-01-23: 4 mg via INTRAVENOUS
  Filled 2011-01-23: qty 1

## 2011-01-23 MED ORDER — ONDANSETRON HCL 4 MG PO TABS: 4.0000 mg | ORAL_TABLET | Freq: Four times a day (QID) | ORAL | Status: AC

## 2011-01-23 NOTE — ED Notes (Signed)
Pt to CT

## 2011-01-23 NOTE — ED Notes (Signed)
Patient hemoccult card negative for blood

## 2011-01-23 NOTE — ED Provider Notes (Signed)
History    is a patient with history of irritable bowel syndrome, presenting to the ED with a chief complaint of diarrhea for the past 3 days. Patient states he's had been having increased diffused abdominal pain improves with bowel movement. Diarrhea seems to be worsening with eating. He has appetite but afraid to eat. He has nausea without vomiting. He felt that he is dehydrated. He denies any recent antibiotic use. He denies seeing blood in his stool. He had similar symptoms about 2 weeks ago and was seen at his primary care practice. It was thought that he may have a viral infection and he was discharged home. He states his symptoms improved, but now is worsening. As fever, headache, chest pain, shortness of breath, back pain, dysuria.  CSN: [id number] Arrival date & time: 01/23/2011  1:28 PM   First MD Initiated Contact with Patient 01/23/11 1417      Chief Complaint  Patient presents with  . Diarrhea    (Consider location/radiation/quality/duration/timing/severity/associated sxs/prior treatment) Patient is a 77 y.o. male presenting with diarrhea. The history is provided by the patient and a significant other. No language interpreter was used.  Diarrhea The primary symptoms include diarrhea. The illness began 3 to 5 days ago. The onset was gradual. The problem has not changed since onset. The illness is also significant for anorexia and bloating. The illness does not include chills, constipation or back pain. Significant associated medical issues include irritable bowel syndrome.    Past Medical History  Diagnosis Date  . PVD (peripheral vascular disease)   . CAD (coronary artery disease)   . Hyperlipidemia   . Cerebrovascular accident   . DVT femoral (deep venous thrombosis) with thrombophlebitis   . GERD (gastroesophageal reflux disease)   . Edema   . Anxiety   . Vertigo   . IBS (irritable bowel syndrome)   . Duodenitis   . Gastritis   . Esophageal stricture   . Cellulitis    . Pulmonary embolism   . Prostatic hypertrophy   . Hiatal hernia   . Frequent falls   . Subdural hematoma     Past Surgical History  Procedure Date  . Hand surgery   . Angioplasty   . Carotid endarterectomy     Left  . Bare-metal stenting     left circumflex  . Bilateral cataract surgery     Family History  Problem Relation Age of Onset  . Colon cancer Neg Hx   . Coronary artery disease Brother   . Heart attack Father     History  Substance Use Topics  . Smoking status: Former Games developer  . Smokeless tobacco: Not on file  . Alcohol Use: Yes     Wine occasional      Review of Systems  Constitutional: Negative for chills.  Gastrointestinal: Positive for diarrhea, bloating and anorexia. Negative for constipation.  Musculoskeletal: Negative for back pain.  All other systems reviewed and are negative.    Allergies  Augmentin; Diazepam; Fludrocortisone acetate; and Transdermal base  Home Medications   Current Outpatient Rx  Name Route Sig Dispense Refill  . ALLOPURINOL 100 MG PO TABS Oral Take 100 mg by mouth daily.      [name] ALPRAZOLAM 0.25 MG PO TABS Oral Take 0.25 mg by mouth at bedtime as needed. anxiety    . ASPIRIN 81 MG PO TABS Oral Take 81 mg by mouth daily.      [name] VITAMIN D3 1000 UNITS PO CAPS Oral Take 1 capsule  by mouth daily.      . DEXLANSOPRAZOLE 60 MG PO CPDR  Take 1 by mouth 30 mins before breakfast     . STOOL SOFTENER PO  1 capsule as needed. constipation    . HYDROCODONE-ACETAMINOPHEN 5-325 MG PO TABS Oral Take 1 tablet by mouth every 6 (six) hours as needed. Pain     . LOSARTAN POTASSIUM 50 MG PO TABS Oral Take 50 mg by mouth daily.      [name] MECLIZINE HCL 25 MG PO TABS Oral Take 25 mg by mouth 3 (three) times daily as needed. dizziness    . POLYETHYLENE GLYCOL 3350 PO POWD Oral Take 17 g by mouth daily as needed. Constipation     . PROMETHAZINE HCL 25 MG PO TABS Oral Take 25 mg by mouth every 6 (six) hours as needed. nausea    . PSYLLIUM PO Oral  Take 1 capsule by mouth daily as needed. constipation     . TAMSULOSIN HCL 0.4 MG PO CAPS Oral Take 0.4 mg by mouth daily.      [name] ZOLPIDEM TARTRATE 10 MG PO TABS Oral Take 10 mg by mouth at bedtime as needed. sleep    . NITROGLYCERIN 0.4 MG SL SUBL Sublingual Place 0.4 mg under the tongue every 5 (five) minutes as needed. Chest pain      BP 145/79  Pulse 58  Temp(Src) 98.2 F (36.8 C) (Oral)  Resp 18  Wt 165 lb (74.844 kg)  SpO2 100%  Physical Exam  Nursing note and vitals reviewed. Constitutional: He appears well-developed and well-nourished.       Awake, alert, nontoxic appearance.  Hard of hearing  HENT:  Head: Normocephalic and atraumatic.  Mouth/Throat: Oropharynx is clear and moist. No oropharyngeal exudate.  Eyes: Conjunctivae are normal. Right eye exhibits no discharge. Left eye exhibits no discharge.  Neck: Neck supple.  Cardiovascular: Normal rate and regular rhythm.  Exam reveals no gallop and no friction rub.   No murmur heard. Pulmonary/Chest: Effort normal. He exhibits no tenderness.  Abdominal: Soft. Bowel sounds are normal. There is tenderness. There is no rebound and no guarding.       Diffused abd pain, non-focal.  No hepatosplenomegaly  Genitourinary: Guaiac negative stool.       No hernia  Musculoskeletal: He exhibits no tenderness.       Baseline ROM, no obvious new focal weakness  Neurological:       Mental status and motor strength appears baseline for patient and situation  Skin: No rash noted.  Psychiatric: He has a normal mood and affect.    ED Course  Procedures (including critical care time)  Labs Reviewed - No data to display No results found.   No diagnosis found.  Results for orders placed during the hospital encounter of 01/23/11  CBC      Component Value Range   WBC 6.4  4.0 - 10.5 (K/uL)   RBC 4.29  4.22 - 5.81 (MIL/uL)   Hemoglobin 14.7  13.0 - 17.0 (g/dL)   HCT 40.9  81.1 - 91.4 (%)   MCV 98.1  78.0 - 100.0 (fL)   MCH 34.3 (*)  26.0 - 34.0 (pg)   MCHC 34.9  30.0 - 36.0 (g/dL)   RDW 78.2  95.6 - 21.3 (%)   Platelets 190  150 - 400 (K/uL)  DIFFERENTIAL      Component Value Range   Neutrophils Relative 59  43 - 77 (%)   Neutro Abs 3.8  1.7 - 7.7 (K/uL)   Lymphocytes Relative 27  12 - 46 (%)   Lymphs Abs 1.7  0.7 - 4.0 (K/uL)   Monocytes Relative 10  3 - 12 (%)   Monocytes Absolute 0.6  0.1 - 1.0 (K/uL)   Eosinophils Relative 3  0 - 5 (%)   Eosinophils Absolute 0.2  0.0 - 0.7 (K/uL)   Basophils Relative 1  0 - 1 (%)   Basophils Absolute 0.0  0.0 - 0.1 (K/uL)  COMPREHENSIVE METABOLIC PANEL      Component Value Range   Sodium 138  135 - 145 (mEq/L)   Potassium 3.7  3.5 - 5.1 (mEq/L)   Chloride 104  96 - 112 (mEq/L)   CO2 24  19 - 32 (mEq/L)   Glucose, Bld 102 (*) 70 - 99 (mg/dL)   BUN 14  6 - 23 (mg/dL)   Creatinine, Ser 7.82  0.50 - 1.35 (mg/dL)   Calcium 9.2  8.4 - 95.6 (mg/dL)   Total Protein 6.9  6.0 - 8.3 (g/dL)   Albumin 3.5  3.5 - 5.2 (g/dL)   AST 15  0 - 37 (U/L)   ALT 14  0 - 53 (U/L)   Alkaline Phosphatase 85  39 - 117 (U/L)   Total Bilirubin 0.4  0.3 - 1.2 (mg/dL)   GFR calc non Af Amer 54 (*) >90 (mL/min)   GFR calc Af Amer 63 (*) >90 (mL/min)  LIPASE, BLOOD      Component Value Range   Lipase 24  11 - 59 (U/L)  URINALYSIS, ROUTINE W REFLEX MICROSCOPIC      Component Value Range   Color, Urine YELLOW  YELLOW    Appearance CLOUDY (*) CLEAR    Specific Gravity, Urine 1.010  1.005 - 1.030    pH 5.0  5.0 - 8.0    Glucose, UA NEGATIVE  NEGATIVE (mg/dL)   Hgb urine dipstick NEGATIVE  NEGATIVE    Bilirubin Urine NEGATIVE  NEGATIVE    Ketones, ur NEGATIVE  NEGATIVE (mg/dL)   Protein, ur NEGATIVE  NEGATIVE (mg/dL)   Urobilinogen, UA 0.2  0.0 - 1.0 (mg/dL)   Nitrite NEGATIVE  NEGATIVE    Leukocytes, UA NEGATIVE  NEGATIVE   OCCULT BLOOD, POC DEVICE      Component Value Range   Fecal Occult Bld NEGATIVE       Date: 01/23/2011  Rate: 56  Rhythm: normal sinus rhythm  QRS Axis: normal   Intervals: normal  ST/T Wave abnormalities: normal  Conduction Disutrbances:none  Narrative Interpretation:   Old EKG Reviewed: unchanged   MDM  Acute on chronic abdominal pain with diarrhea in the setting of irritable bowel syndrome.  Pain and abdominal and pelvis CT scan due to patient's age.  Oral fluid hydrates and will also give anti-emetic. Being no acute distress, and his vital signs are stable. No recent antibiotic use, I have low suspicion for C. Difficile.   6:01 PM Abdominal CT result today does not shows any acute finding.  There is some evidence of constipation. With patient complaining of diarrhea and constipation, likely secondary to his irritable bowel syndrome.  This lab result is only significant for a low GFR, likely secondary to dehydration.  Patient states he is feeling much better after IV fluid. His vital signs stable. He agrees to follow up with his primary care doctor for further evaluation. I suggest starting out with bland diet and keeping himself hydrated.  He is able to tolerate by mouth  at this time.       Fayrene Helper, PA 01/23/11 1810

## 2011-01-23 NOTE — ED Notes (Signed)
Daughter states "2 wks ago we saw PA, told us it was a virus but since Friday, he's been having diarrhea again"

## 2011-01-24 NOTE — ED Provider Notes (Signed)
Medical screening examination/treatment/procedure(s) were performed by non-physician practitioner and as supervising physician I was immediately available for consultation/collaboration.   Gerhard Munch, MD 01/24/11 2154

## 2011-03-14 HISTORY — PX: INSERTION OF VENA CAVA FILTER: SHX5871

## 2011-04-18 ENCOUNTER — Ambulatory Visit: Payer: Medicare Other | Admitting: *Deleted

## 2011-04-20 ENCOUNTER — Ambulatory Visit: Payer: Medicare Other | Attending: Neurology | Admitting: *Deleted

## 2011-04-20 DIAGNOSIS — M545 Low back pain, unspecified: Secondary | ICD-10-CM | POA: Insufficient documentation

## 2011-04-20 DIAGNOSIS — R269 Unspecified abnormalities of gait and mobility: Secondary | ICD-10-CM | POA: Insufficient documentation

## 2011-04-20 DIAGNOSIS — IMO0001 Reserved for inherently not codable concepts without codable children: Secondary | ICD-10-CM | POA: Insufficient documentation

## 2011-04-20 DIAGNOSIS — R293 Abnormal posture: Secondary | ICD-10-CM | POA: Insufficient documentation

## 2011-04-24 ENCOUNTER — Ambulatory Visit: Payer: Medicare Other | Admitting: *Deleted

## 2011-04-25 ENCOUNTER — Emergency Department (HOSPITAL_COMMUNITY): Payer: Medicare Other

## 2011-04-25 ENCOUNTER — Encounter (HOSPITAL_COMMUNITY): Payer: Self-pay | Admitting: Family Medicine

## 2011-04-25 ENCOUNTER — Emergency Department (HOSPITAL_COMMUNITY)
Admission: EM | Admit: 2011-04-25 | Discharge: 2011-04-25 | Disposition: A | Discharge status: Home / Self Care | Payer: Medicare Other | Attending: Emergency Medicine | Admitting: Emergency Medicine

## 2011-04-25 DIAGNOSIS — Z8673 Personal history of transient ischemic attack (TIA), and cerebral infarction without residual deficits: Secondary | ICD-10-CM | POA: Insufficient documentation

## 2011-04-25 DIAGNOSIS — I2510 Atherosclerotic heart disease of native coronary artery without angina pectoris: Secondary | ICD-10-CM | POA: Insufficient documentation

## 2011-04-25 DIAGNOSIS — Z79899 Other long term (current) drug therapy: Secondary | ICD-10-CM | POA: Insufficient documentation

## 2011-04-25 DIAGNOSIS — K5641 Fecal impaction: Secondary | ICD-10-CM | POA: Insufficient documentation

## 2011-04-25 DIAGNOSIS — Z86718 Personal history of other venous thrombosis and embolism: Secondary | ICD-10-CM | POA: Insufficient documentation

## 2011-04-25 DIAGNOSIS — K5900 Constipation, unspecified: Secondary | ICD-10-CM | POA: Insufficient documentation

## 2011-04-25 DIAGNOSIS — R10819 Abdominal tenderness, unspecified site: Secondary | ICD-10-CM | POA: Insufficient documentation

## 2011-04-25 DIAGNOSIS — R109 Unspecified abdominal pain: Secondary | ICD-10-CM | POA: Insufficient documentation

## 2011-04-25 LAB — URINALYSIS, ROUTINE W REFLEX MICROSCOPIC
Glucose, UA: NEGATIVE mg/dL
Hgb urine dipstick: NEGATIVE
Leukocytes, UA: NEGATIVE
Protein, ur: NEGATIVE mg/dL
pH: 6.5 (ref 5.0–8.0)

## 2011-04-25 LAB — DIFFERENTIAL
Eosinophils Relative: 0 % (ref 0–5)
Lymphocytes Relative: 15 % (ref 12–46)
Lymphs Abs: 1.5 10*3/uL (ref 0.7–4.0)
Monocytes Absolute: 0.8 10*3/uL (ref 0.1–1.0)
Monocytes Relative: 8 % (ref 3–12)
Neutro Abs: 7.4 10*3/uL (ref 1.7–7.7)

## 2011-04-25 LAB — COMPREHENSIVE METABOLIC PANEL
BUN: 20 mg/dL (ref 6–23)
CO2: 23 mEq/L (ref 19–32)
Calcium: 9.3 mg/dL (ref 8.4–10.5)
Chloride: 106 mEq/L (ref 96–112)
Creatinine, Ser: 1.14 mg/dL (ref 0.50–1.35)
GFR calc Af Amer: 64 mL/min — ABNORMAL LOW (ref >90)
GFR calc non Af Amer: 55 mL/min — ABNORMAL LOW (ref >90)
Glucose, Bld: 93 mg/dL (ref 70–99)
Total Bilirubin: 0.7 mg/dL (ref 0.3–1.2)

## 2011-04-25 LAB — CBC
HCT: 41.5 % (ref 39.0–52.0)
Hemoglobin: 14.5 g/dL (ref 13.0–17.0)
MCV: 95.6 fL (ref 78.0–100.0)
RBC: 4.34 MIL/uL (ref 4.22–5.81)
RDW: 13.2 % (ref 11.5–15.5)
WBC: 9.8 10*3/uL (ref 4.0–10.5)

## 2011-04-25 MED ORDER — PEG 3350-KCL-NA BICARB-NACL 420 G PO SOLR
4000.0000 mL | Freq: Once | ORAL | Status: AC
  Administered 2011-04-25: 4000 mL via ORAL
  Filled 2011-04-25: qty 4000

## 2011-04-25 MED ORDER — IOHEXOL 300 MG/ML  SOLN
100.0000 mL | Freq: Once | INTRAMUSCULAR | Status: AC | PRN
  Administered 2011-04-25: 100 mL via INTRAVENOUS

## 2011-04-25 MED ORDER — SODIUM CHLORIDE 0.9 % IV BOLUS (SEPSIS)
500.0000 mL | Freq: Once | INTRAVENOUS | Status: AC
  Administered 2011-04-25: 500 mL via INTRAVENOUS

## 2011-04-25 MED ORDER — FLEET ENEMA 7-19 GM/118ML RE ENEM
1.0000 | ENEMA | Freq: Once | RECTAL | Status: AC
  Administered 2011-04-25: 1 via RECTAL
  Filled 2011-04-25: qty 1

## 2011-04-25 NOTE — ED Notes (Signed)
Patient transported to X-ray 

## 2011-04-25 NOTE — ED Notes (Signed)
Returned from CT scan.

## 2011-04-25 NOTE — ED Notes (Signed)
Per EMS: Pt from home, lives with wife and has home health aide. Reports constipation x4 days and states was sitting on toilet trying to have bm x30-45 min ago and felt like he was going to pass out.

## 2011-04-25 NOTE — ED Provider Notes (Signed)
History     CSN: [id number]  Arrival date & time 04/25/11  1314   First MD Initiated Contact with Patient 04/25/11 1353      Chief Complaint  Patient presents with  . Constipation    (Consider location/radiation/quality/duration/timing/severity/associated sxs/prior treatment) HPI  31yoM h/o multiple medical problems incl IBD pw constipation. She states that he's had no bowel movements for the past 4 days. He reports minimal bowel movements over that time. He states he is not passing gas anymore. He complains of mild nausea without vomiting. He states that just prior to arrival he fell rectal fullness and he was sitting on the toilet, felt like he was going to pass out. He did not actually faint. He denies headache, dizziness vision, chest pain, shortness of breath. He complains of mild diffuse lower abdominal pain. Denies hematuria/dysuria/freq/urgency. No rectal bleeding. He takes Norco for chronic back pain. Has taken miralax x 3 days without relief of sx.  H/o fecal impaction in past requiring manual disimpaction. Denies h/o abdominal surgeries.   ED Notes, ED Provider Notes from 04/25/11 0000 to 04/25/11 13:23:30       Elmon Else, RN 04/25/2011 13:23      Per EMS: Pt from home, lives with wife and has home health aide. Reports constipation x4 days and states was sitting on toilet trying to have bm x30-45 min ago and felt like he was going to pass out.     Past Medical History  Diagnosis Date  . PVD (peripheral vascular disease)   . CAD (coronary artery disease)   . Hyperlipidemia   . Cerebrovascular accident   . DVT femoral (deep venous thrombosis) with thrombophlebitis   . GERD (gastroesophageal reflux disease)   . Edema   . Anxiety   . Vertigo   . IBS (irritable bowel syndrome)   . Duodenitis   . Gastritis   . Esophageal stricture   . Cellulitis   . Pulmonary embolism   . Prostatic hypertrophy   . Hiatal hernia   . Frequent falls   . Subdural hematoma      Past Surgical History  Procedure Date  . Hand surgery   . Angioplasty   . Carotid endarterectomy     Left  . Bare-metal stenting     left circumflex  . Bilateral cataract surgery     Family History  Problem Relation Age of Onset  . Colon cancer Neg Hx   . Coronary artery disease Brother   . Heart attack Father     History  Substance Use Topics  . Smoking status: Former Games developer  . Smokeless tobacco: Not on file  . Alcohol Use: Yes     Wine occasional      Review of Systems  All other systems reviewed and are negative.   except as noted HPI   Allergies  Augmentin; Diazepam; Fludrocortisone acetate; and Transdermal base  Home Medications   Current Outpatient Rx  Name Route Sig Dispense Refill  . ALLOPURINOL 100 MG PO TABS Oral Take 100 mg by mouth daily.      [name] ALPRAZOLAM 0.25 MG PO TABS Oral Take 0.25 mg by mouth at bedtime as needed. anxiety    . ASPIRIN 81 MG PO TABS Oral Take 81 mg by mouth daily.      [name] VITAMIN D3 1000 UNITS PO CAPS Oral Take 1 capsule by mouth daily.      . DEXLANSOPRAZOLE 60 MG PO CPDR  Take 1 by mouth 30 mins  before breakfast     . STOOL SOFTENER PO  1 capsule as needed. constipation    . HYDROCODONE-ACETAMINOPHEN 5-325 MG PO TABS Oral Take 1 tablet by mouth every 6 (six) hours as needed. Pain     . HYOSCYAMINE SULFATE 0.125 MG PO TBDP Sublingual Place 0.125 mg under the tongue daily as needed. For stomach pain    . LOSARTAN POTASSIUM 50 MG PO TABS Oral Take 50 mg by mouth daily.      [name] MECLIZINE HCL 25 MG PO TABS Oral Take 25 mg by mouth 3 (three) times daily as needed. dizziness    . ADULT MULTIVITAMIN W/MINERALS CH Oral Take 1 tablet by mouth daily.    [name] OMEPRAZOLE 20 MG PO CPDR Oral Take 20 mg by mouth daily.    [name] POLYETHYLENE GLYCOL 3350 PO POWD Oral Take 17 g by mouth daily as needed. Constipation     . PROMETHAZINE HCL 25 MG PO TABS Oral Take 25 mg by mouth every 6 (six) hours as needed. nausea    . PSYLLIUM PO Oral Take 1  capsule by mouth daily as needed. constipation     . TAMSULOSIN HCL 0.4 MG PO CAPS Oral Take 0.4 mg by mouth daily.      [name] TIZANIDINE HCL 2 MG PO TABS Oral Take 2 mg by mouth 2 (two) times daily.    [name] ZOLPIDEM TARTRATE 10 MG PO TABS Oral Take 10 mg by mouth at bedtime as needed. sleep    . NITROGLYCERIN 0.4 MG SL SUBL Sublingual Place 0.4 mg under the tongue every 5 (five) minutes as needed. Chest pain      BP 140/77  Pulse 79  Temp(Src) 98.4 F (36.9 C) (Oral)  Resp 15  SpO2 98%  Physical Exam  Nursing note and vitals reviewed. Constitutional: He is oriented to person, place, and time. He appears well-developed and well-nourished. No distress.  HENT:  Head: Atraumatic.  Mouth/Throat: Oropharynx is clear and moist.  Eyes: Conjunctivae are normal. Pupils are equal, round, and reactive to light.  Neck: Neck supple.  Cardiovascular: Normal rate, regular rhythm, normal heart sounds and intact distal pulses.  Exam reveals no gallop and no friction rub.   No murmur heard. Pulmonary/Chest: Effort normal. No respiratory distress. He has no wheezes. He has no rales.  Abdominal: Soft. Bowel sounds are normal. He exhibits no distension and no mass. There is tenderness. There is no rebound and no guarding.       Mild diffuse ttp  Genitourinary:       External genitalia unremarkable Rectum with impacted stool, [place]  Musculoskeletal: Normal range of motion. He exhibits no edema and no tenderness.  Neurological: He is alert and oriented to person, place, and time.  Skin: Skin is warm and dry.  Psychiatric: He has a normal mood and affect.    ED Course  Procedures (including critical care time)  Labs Reviewed  URINALYSIS, ROUTINE W REFLEX MICROSCOPIC - Abnormal; Notable for the following:    Ketones, ur 15 (*)    All other components within normal limits  CBC  DIFFERENTIAL  COMPREHENSIVE METABOLIC PANEL   Dg Abd Acute W/chest  04/25/2011  *RADIOLOGY REPORT*  Clinical Data:  Constipation and of lower abdominal pain.  ACUTE ABDOMEN SERIES (ABDOMEN 2 VIEW & CHEST 1 VIEW)  Comparison: CT of the abdomen and pelvis 01/23/2011.  Findings: Chest film is over penetrated.  Lung parenchyma appears clear. No pleural effusions. Pulmonary vasculature appears normal. Heart size  is normal.  Atherosclerosis in the thoracic aorta. Mediastinal contours are within normal limits.  Supine, supine and left lateral decubitus views of the abdomen demonstrate gas and stool spread throughout the colon extending to the distal rectum.  There are multiple dilated loops of small bowel (up to 3.5 cm in diameter), and multiple air fluid levels noted on the decubitus view.  No gross pneumoperitoneum.  Numerous phleboliths are noted in the lower anatomic pelvis.  IMPRESSION:  1.  Nonspecific bowel gas pattern, as above, which could be indicative of early or partial small bowel obstruction. 2.  Chest film is limited by overpenetration of the image, however, there does not appear to be any acute cardiopulmonary disease. 3.  Atherosclerosis.  This was made a call report.  Original Report Authenticated By: Florencia Reasons, M.D.     1. Constipation   2. Fecal impaction     MDM  H/o IBD, chronic back pain on Norco pw constipation, nausea, min abdominal pain. U/A without evidence for infection. XR abd obstructive series with nonspecific bowel gas pattern, air fluid levels. Possible early/partial obstruction. Patient given rectal enema AFTER xr complete. Manual disimpaction with fist sized amount of stool removed. Patient currently using bed pan. CT A/P eval obstruction pending.  Pt signed out to Dr. Estell Harpin. CT A/P, CBC, CMP pending.         Forbes Cellar, MD 04/25/11 [phone]

## 2011-04-25 NOTE — ED Notes (Signed)
Pt returned from xray

## 2011-04-25 NOTE — ED Provider Notes (Signed)
Patient's CT showthe patient was sent home with a bottle of GoLYTELY and told to drink one glass every hour until he has a bowel movement. And to followup with his doctor or the urologist this week to remove Foley to  Benny Lennert, MD 04/25/11 1930

## 2011-04-25 NOTE — Discharge Instructions (Signed)
Drink one glass of go lyltely every hour until you have a bowel movement.  Follow up with your md or urologist this week

## 2011-05-04 ENCOUNTER — Ambulatory Visit: Payer: Medicare Other | Admitting: *Deleted

## 2011-05-08 ENCOUNTER — Ambulatory Visit: Payer: Medicare Other | Admitting: Physical Therapy

## 2011-05-11 ENCOUNTER — Ambulatory Visit: Payer: Medicare Other | Admitting: Physical Therapy

## 2011-05-15 ENCOUNTER — Encounter: Payer: Medicare Other | Admitting: Physical Therapy

## 2011-05-15 ENCOUNTER — Ambulatory Visit: Payer: Medicare Other | Attending: Neurology | Admitting: Physical Therapy

## 2011-05-15 DIAGNOSIS — M545 Low back pain, unspecified: Secondary | ICD-10-CM | POA: Insufficient documentation

## 2011-05-15 DIAGNOSIS — R293 Abnormal posture: Secondary | ICD-10-CM | POA: Insufficient documentation

## 2011-05-15 DIAGNOSIS — R269 Unspecified abnormalities of gait and mobility: Secondary | ICD-10-CM | POA: Insufficient documentation

## 2011-05-15 DIAGNOSIS — IMO0001 Reserved for inherently not codable concepts without codable children: Secondary | ICD-10-CM | POA: Insufficient documentation

## 2011-05-17 ENCOUNTER — Encounter: Payer: Medicare Other | Admitting: Physical Therapy

## 2011-05-18 ENCOUNTER — Encounter: Payer: Medicare Other | Admitting: Physical Therapy

## 2011-05-22 ENCOUNTER — Encounter: Payer: Medicare Other | Admitting: Physical Therapy

## 2011-05-25 ENCOUNTER — Encounter: Payer: Medicare Other | Admitting: Physical Therapy

## 2011-05-25 ENCOUNTER — Ambulatory Visit: Payer: Medicare Other | Admitting: Physical Therapy

## 2011-05-29 ENCOUNTER — Encounter: Payer: Medicare Other | Admitting: Physical Therapy

## 2011-05-30 ENCOUNTER — Ambulatory Visit: Payer: Medicare Other

## 2011-06-01 ENCOUNTER — Ambulatory Visit: Payer: Medicare Other

## 2011-06-01 ENCOUNTER — Encounter: Payer: Medicare Other | Admitting: Physical Therapy

## 2011-06-05 ENCOUNTER — Encounter: Payer: Medicare Other | Admitting: Physical Therapy

## 2011-06-08 ENCOUNTER — Encounter: Payer: Medicare Other | Admitting: Physical Therapy

## 2011-07-05 ENCOUNTER — Emergency Department (HOSPITAL_COMMUNITY)
Admission: EM | Admit: 2011-07-05 | Discharge: 2011-07-05 | Disposition: A | Discharge status: Home / Self Care | Payer: Medicare Other | Attending: Emergency Medicine | Admitting: Emergency Medicine

## 2011-07-05 ENCOUNTER — Encounter (HOSPITAL_COMMUNITY): Payer: Self-pay | Admitting: *Deleted

## 2011-07-05 DIAGNOSIS — I82409 Acute embolism and thrombosis of unspecified deep veins of unspecified lower extremity: Secondary | ICD-10-CM

## 2011-07-05 DIAGNOSIS — M7989 Other specified soft tissue disorders: Secondary | ICD-10-CM | POA: Insufficient documentation

## 2011-07-05 DIAGNOSIS — I2510 Atherosclerotic heart disease of native coronary artery without angina pectoris: Secondary | ICD-10-CM | POA: Insufficient documentation

## 2011-07-05 DIAGNOSIS — Z8679 Personal history of other diseases of the circulatory system: Secondary | ICD-10-CM | POA: Insufficient documentation

## 2011-07-05 DIAGNOSIS — Z86718 Personal history of other venous thrombosis and embolism: Secondary | ICD-10-CM | POA: Insufficient documentation

## 2011-07-05 DIAGNOSIS — K219 Gastro-esophageal reflux disease without esophagitis: Secondary | ICD-10-CM | POA: Insufficient documentation

## 2011-07-05 DIAGNOSIS — Z86711 Personal history of pulmonary embolism: Secondary | ICD-10-CM | POA: Insufficient documentation

## 2011-07-05 DIAGNOSIS — M7120 Synovial cyst of popliteal space [Baker], unspecified knee: Secondary | ICD-10-CM

## 2011-07-05 DIAGNOSIS — I739 Peripheral vascular disease, unspecified: Secondary | ICD-10-CM | POA: Insufficient documentation

## 2011-07-05 DIAGNOSIS — K589 Irritable bowel syndrome without diarrhea: Secondary | ICD-10-CM | POA: Insufficient documentation

## 2011-07-05 DIAGNOSIS — E785 Hyperlipidemia, unspecified: Secondary | ICD-10-CM | POA: Insufficient documentation

## 2011-07-05 DIAGNOSIS — I824Y9 Acute embolism and thrombosis of unspecified deep veins of unspecified proximal lower extremity: Secondary | ICD-10-CM | POA: Insufficient documentation

## 2011-07-05 HISTORY — DX: Post-traumatic stress disorder, unspecified: F43.10

## 2011-07-05 LAB — BASIC METABOLIC PANEL
BUN: 17 mg/dL (ref 6–23)
Chloride: 102 mEq/L (ref 96–112)
GFR calc Af Amer: 59 mL/min — ABNORMAL LOW (ref >90)
GFR calc non Af Amer: 51 mL/min — ABNORMAL LOW (ref >90)
Potassium: 4.8 mEq/L (ref 3.5–5.1)

## 2011-07-05 LAB — PROTIME-INR: Prothrombin Time: 12.9 seconds (ref 11.6–15.2)

## 2011-07-05 LAB — CBC
MCH: 33.2 pg (ref 26.0–34.0)
MCV: 99.1 fL (ref 78.0–100.0)
Platelets: 165 10*3/uL (ref 150–400)
RBC: 4.28 MIL/uL (ref 4.22–5.81)
RDW: 14.7 % (ref 11.5–15.5)
WBC: 6.6 10*3/uL (ref 4.0–10.5)

## 2011-07-05 MED ORDER — OXYCODONE-ACETAMINOPHEN 5-325 MG PO TABS
1.0000 | ORAL_TABLET | Freq: Once | ORAL | Status: AC
  Administered 2011-07-05: 1 via ORAL
  Filled 2011-07-05: qty 1

## 2011-07-05 MED ORDER — ENOXAPARIN SODIUM 150 MG/ML ~~LOC~~ SOLN: 1.0000 mg/kg | Freq: Two times a day (BID) | SUBCUTANEOUS | Status: DC

## 2011-07-05 MED ORDER — WARFARIN SODIUM 5 MG PO TABS: 5.0000 mg | ORAL_TABLET | Freq: Every day | ORAL | Status: DC

## 2011-07-05 MED ORDER — WARFARIN SODIUM 5 MG PO TABS
5.0000 mg | ORAL_TABLET | ORAL | Status: AC
  Administered 2011-07-05: 5 mg via ORAL
  Filled 2011-07-05: qty 1

## 2011-07-05 MED ORDER — ENOXAPARIN SODIUM 100 MG/ML ~~LOC~~ SOLN
1.0000 mg/kg | SUBCUTANEOUS | Status: AC
  Administered 2011-07-05: 75 mg via SUBCUTANEOUS
  Filled 2011-07-05: qty 1

## 2011-07-05 MED ORDER — ENOXAPARIN SODIUM 150 MG/ML ~~LOC~~ SOLN: 75.0000 mg | Freq: Two times a day (BID) | SUBCUTANEOUS | Status: DC

## 2011-07-05 NOTE — ED Notes (Signed)
Right popliteal DVT and Left side bakers cyst per Vascular ultrasound

## 2011-07-05 NOTE — Progress Notes (Signed)
*  PRELIMINARY RESULTS* Vascular Ultrasound Lower extremity venous duplex has been completed.  Preliminary findings: Right= Evidence of DVT involving the popliteal vein. No baker's cyst. Left= No evidence of DVT. Baker's cyst seen in medial pop fossa.  Farrel Demark , RDMS 07/05/2011, 6:31 PM

## 2011-07-05 NOTE — ED Notes (Signed)
Patient came to the ED from the [place].  Patient with complaints of feeling light headed, he has swelling in his legs and this left leg is painful.  Patient has hx of dvt

## 2011-07-05 NOTE — Discharge Instructions (Signed)
You have a blood clot in your right leg and a Baker's cyst in your left leg.  Use Lovenox 75 mg every 12 hours for the next 5 days.  Use Coumadin 5 mg per day.  Followup with your Dr. in 3 days to check your DC to determine whether or not.  The Coumadin is functioning properly.STOP your allopurinol while you are taking coumadin.   Return for uncontrolled pain or chest pain and shortness of breath

## 2011-07-05 NOTE — ED Provider Notes (Signed)
History     CSN: [id number]  Arrival date & time 07/05/11  1524   First MD Initiated Contact with Patient 07/05/11 1636      Chief Complaint  Patient presents with  . Leg Swelling    (Consider location/radiation/quality/duration/timing/severity/associated sxs/prior treatment) The history is provided by the patient and a relative.   the patient is an 78 year old, male, with a history of DVT and pulmonary embolism, in 2007, who no longer takes Coumadin.  He was brought to the emergency department for evaluation of bilateral lower extremity pain and swelling.  He denies chest pain, cough, or shortness of breath.  He denies recent travel or surgery.  He denies fevers, or chills.  Past Medical History  Diagnosis Date  . PVD (peripheral vascular disease)   . CAD (coronary artery disease)   . Hyperlipidemia   . Cerebrovascular accident   . DVT femoral (deep venous thrombosis) with thrombophlebitis   . GERD (gastroesophageal reflux disease)   . Edema   . Anxiety   . Vertigo   . IBS (irritable bowel syndrome)   . Duodenitis   . Gastritis   . Esophageal stricture   . Cellulitis   . Pulmonary embolism   . Prostatic hypertrophy   . Hiatal hernia   . Frequent falls   . Subdural hematoma   . PTSD (post-traumatic stress disorder)   . Vertigo     Past Surgical History  Procedure Date  . Hand surgery   . Angioplasty   . Carotid endarterectomy     Left  . Bare-metal stenting     left circumflex  . Bilateral cataract surgery     Family History  Problem Relation Age of Onset  . Colon cancer Neg Hx   . Coronary artery disease Brother   . Heart attack Father     History  Substance Use Topics  . Smoking status: Former Games developer  . Smokeless tobacco: Not on file  . Alcohol Use: Yes     Wine occasional      Review of Systems  Constitutional: Negative for fever and chills.  Respiratory: Negative for cough, chest tightness and shortness of breath.   Cardiovascular:  Positive for leg swelling. Negative for chest pain.  Gastrointestinal: Positive for nausea. Negative for vomiting.  Skin: Negative for rash.  Neurological: Negative for headaches.  Hematological: Does not bruise/bleed easily.  Psychiatric/Behavioral: Negative for confusion.  All other systems reviewed and are negative.    Allergies  Augmentin; Diazepam; Fludrocortisone acetate; and Transdermal base  Home Medications   Current Outpatient Rx  Name Route Sig Dispense Refill  . ALLOPURINOL 100 MG PO TABS Oral Take 100 mg by mouth daily.      [name] ALPRAZOLAM 0.25 MG PO TABS Oral Take 0.25 mg by mouth at bedtime as needed. anxiety    . ASPIRIN 81 MG PO TABS Oral Take 81 mg by mouth daily.      [name] VITAMIN D3 1000 UNITS PO CAPS Oral Take 1 capsule by mouth daily.      . STOOL SOFTENER PO  1 capsule as needed. constipation    . DULOXETINE HCL 30 MG PO CPEP Oral Take 30 mg by mouth at bedtime.    [name] GABAPENTIN 300 MG PO CAPS Oral Take 300 mg by mouth 2 (two) times daily.    [name] HYDROCODONE-ACETAMINOPHEN 5-325 MG PO TABS Oral Take 1 tablet by mouth every 6 (six) hours as needed. Pain     . LOSARTAN POTASSIUM 50 MG  PO TABS Oral Take 50 mg by mouth daily.      [name] MECLIZINE HCL 25 MG PO TABS Oral Take 25 mg by mouth 3 (three) times daily as needed. dizziness    . ADULT MULTIVITAMIN W/MINERALS CH Oral Take 1 tablet by mouth daily.    [name] NITROGLYCERIN 0.4 MG SL SUBL Sublingual Place 0.4 mg under the tongue every 5 (five) minutes as needed. Chest pain    . OMEPRAZOLE 20 MG PO CPDR Oral Take 20 mg by mouth daily.    [name] POLYETHYLENE GLYCOL 3350 PO POWD Oral Take 17 g by mouth daily as needed. Constipation     . PROMETHAZINE HCL 25 MG PO TABS Oral Take 25 mg by mouth every 6 (six) hours as needed. nausea    . PSYLLIUM PO Oral Take 1 capsule by mouth daily as needed. constipation     . TAMSULOSIN HCL 0.4 MG PO CAPS Oral Take 0.4 mg by mouth daily.      [name] TIZANIDINE HCL 2 MG PO TABS Oral Take 2 mg by mouth 2  (two) times daily.    [name] ZOLPIDEM TARTRATE 10 MG PO TABS Oral Take 10 mg by mouth at bedtime as needed. sleep      BP 159/75  Pulse 60  Temp(Src) 98 F (36.7 C) (Oral)  Resp 18  SpO2 96%  Physical Exam  Nursing note and vitals reviewed. Constitutional: He appears well-developed and well-nourished.  HENT:  Head: Normocephalic and atraumatic.  Eyes: Conjunctivae and EOM are normal.  Neck: Normal range of motion. Neck supple.  Cardiovascular: Normal rate.   No murmur heard. Pulmonary/Chest: Effort normal. No respiratory distress. He has no rales.  Musculoskeletal: Normal range of motion. He exhibits edema and tenderness.       Bilateral calf swelling, with mild tenderness.  No erythema or rash or discoloration  Skin: Skin is warm and dry. No rash noted. No erythema.  Psychiatric: He has a normal mood and affect. Thought content normal.    ED Course  Procedures (including critical care time) 78 year old, male, with bilateral leg swelling and pain, with history of DVT and PE.  Presently, he has no respiratory symptoms, or chest pain.  He does not have any recent travel or surgery, which would increase his risk, but given the fact that he has had blood clots in the past.  We will perform ultrasounds of his legs.  For further evaluation.   Labs Reviewed  BASIC METABOLIC PANEL   No results found.   No diagnosis found.    MDM  Bilateral leg swelling in patient with history of DVT. No signs or symptoms of PE at this time Started on lovenox and coumadin. Counseled to stop allopurinol.  Told to f/u with his pcp to check pt and advised to return if sxs c/w pe develop.        Cheri Guppy, MD 07/05/11 [phone]

## 2011-07-05 NOTE — ED Notes (Signed)
Pt sent here from pcp to r/o dvt to bilateral legs, having swelling and pain to bilateral lower legs and has hx of dvt. No acute distress noted at this time.

## 2011-07-07 ENCOUNTER — Telehealth: Payer: Self-pay | Admitting: Cardiovascular Disease

## 2011-07-07 NOTE — Telephone Encounter (Signed)
Pt seen in the ER for DVT and was started on Coumadin. The pt is scheduled to follow-up with Dr Tanya Nones on Monday.  I made Jason [name] aware that the PCP will manage DVT and coumadin. At this time the pt does not need to schedule an earlier follow-up with Dr Excell Seltzer.

## 2011-07-07 NOTE — Telephone Encounter (Signed)
Please return call to patient daughter Jason [name]  [phone]   Patient daughter Jason [name] calling to make Dr. Excell Seltzer aware that patient has blood in right leg.  Patient has 08/2011 recall but wondered if Dr. Excell Seltzer will require he to come in earlier due to the circumstances.  Please return call to patient Jason [name] [phone].

## 2011-08-23 ENCOUNTER — Ambulatory Visit (INDEPENDENT_AMBULATORY_CARE_PROVIDER_SITE_OTHER): Payer: Medicare Other | Admitting: Cardiovascular Disease

## 2011-08-23 ENCOUNTER — Encounter: Payer: Self-pay | Admitting: Cardiovascular Disease

## 2011-08-23 VITALS — BP 146/82 | HR 59 | Ht 71.0 in | Wt 176.0 lb

## 2011-08-23 DIAGNOSIS — I2510 Atherosclerotic heart disease of native coronary artery without angina pectoris: Secondary | ICD-10-CM

## 2011-08-23 NOTE — Patient Instructions (Addendum)
Your physician wants you to follow-up in: ONE YEAR WITH DR COOPER You will receive a reminder letter in the mail two months in advance. If you don't receive a letter, please call our office to schedule the follow-up appointment.  

## 2011-08-24 ENCOUNTER — Encounter: Payer: Self-pay | Admitting: Cardiovascular Disease

## 2011-08-24 NOTE — Progress Notes (Signed)
HPI:  78 year old gentleman presenting for followup evaluation. The patient has coronary artery disease status post bare-metal stenting of left circumflex several years ago. He also has a history of recurrent DVT. He had been off Coumadin because of falls. However, within the past few months he developed a recurrent lower extremity DVT and was started on Xarelto. He's tolerating this well his had no bleeding problems. His leg symptoms have improved and he reports no further pain or swelling. He denies chest pain, chest pressure, dyspnea, orthopnea, PND, lightheadedness, or syncope.  Outpatient Encounter Prescriptions as of 08/23/2011  Medication Sig Dispense Refill  . ALPRAZolam (XANAX) 0.25 MG tablet Take 0.25 mg by mouth at bedtime as needed. anxiety      . aspirin 81 MG tablet Take 81 mg by mouth daily.        . Cholecalciferol (VITAMIN D3) 1000 UNITS CAPS Take 1 capsule by mouth daily.        Tery Sanfilippo Calcium (STOOL SOFTENER PO) 1 capsule as needed. constipation      . DULoxetine (CYMBALTA) 30 MG capsule Take 30 mg by mouth at bedtime.      . gabapentin (NEURONTIN) 300 MG capsule Take 300 mg by mouth 2 (two) times daily.      [name] HYDROcodone-acetaminophen (NORCO) 5-325 MG per tablet Take 1 tablet by mouth every 6 (six) hours as needed. Pain       . meclizine (ANTIVERT) 25 MG tablet Take 12.5 mg by mouth as needed. dizziness      . nitroGLYCERIN (NITROSTAT) 0.4 MG SL tablet Place 0.4 mg under the tongue every 5 (five) minutes as needed. Chest pain      . omeprazole (PRILOSEC) 20 MG capsule Take 20 mg by mouth 2 (two) times daily.       . polyethylene glycol powder (MIRALAX) powder Take 17 g by mouth daily as needed. Constipation       . promethazine (PHENERGAN) 25 MG tablet Take 25 mg by mouth every 6 (six) hours as needed. nausea      . rivaroxaban (XARELTO) 10 MG TABS tablet Take 5 mg by mouth daily.      . Tamsulosin HCl (FLOMAX) 0.4 MG CAPS Take 0.4 mg by mouth daily.        [name] zolpidem  (AMBIEN) 10 MG tablet Take 5 mg by mouth at bedtime as needed. sleep      . DISCONTD: enoxaparin (LOVENOX) 150 MG/ML injection Inject 0.5 mLs (75 mg total) into the skin every 12 (twelve) hours.  5 mL  0  . DISCONTD: losartan (COZAAR) 50 MG tablet Take 50 mg by mouth daily.        [name] DISCONTD: Multiple Vitamin (MULITIVITAMIN WITH MINERALS) TABS Take 1 tablet by mouth daily.      [name] DISCONTD: PSYLLIUM PO Take 1 capsule by mouth daily as needed. constipation       . DISCONTD: tiZANidine (ZANAFLEX) 2 MG tablet Take 2 mg by mouth 2 (two) times daily.      [name] DISCONTD: warfarin (COUMADIN) 5 MG tablet Take 1 tablet (5 mg total) by mouth daily.  20 tablet  0    Allergies  Allergen Reactions  . Amoxicillin-Pot Clavulanate Other (See Comments)    unknown  . Diazepam     REACTION: weak  . Fludrocortisone Acetate Other (See Comments)    unknown  . Transdermal Base (Transdermal Base) Other (See Comments)    unknown    Past Medical History  Diagnosis Date  .  PVD (peripheral vascular disease)   . CAD (coronary artery disease)   . Hyperlipidemia   . Cerebrovascular accident   . DVT femoral (deep venous thrombosis) with thrombophlebitis   . GERD (gastroesophageal reflux disease)   . Edema   . Anxiety   . Vertigo   . IBS (irritable bowel syndrome)   . Duodenitis   . Gastritis   . Esophageal stricture   . Cellulitis   . Pulmonary embolism   . Prostatic hypertrophy   . Hiatal hernia   . Frequent falls   . Subdural hematoma   . PTSD (post-traumatic stress disorder)   . Vertigo     ROS: Negative except as per HPI  BP 146/82  Pulse 59  Ht 5\' 11"  (1.803 m)  Wt 79.833 kg (176 lb)  BMI 24.55 kg/m2  PHYSICAL EXAM: Pt is alert and oriented, pleasant elderly male in NAD HEENT: normal Neck: JVP - normal, carotids 2+= with bilateral bruits left greater than right Lungs: CTA bilaterally CV: RRR without murmur or gallop Abd: soft, NT, Positive BS, no hepatomegaly Ext: Trace pretibial edema  bilaterally, distal pulses intact and equal Skin: warm/dry no rash  EKG:  Sinus rhythm 59 beats per minute, incomplete right bundle branch block, otherwise within normal limits.  ASSESSMENT AND PLAN: 1. Coronary artery disease. The patient is stable with no anginal symptoms. He'll continue with his current medical program.  2. Recurrent DVT. The patient is now anticoagulated with rivaroxaban.  3. Carotid artery disease. The patient has a loud left carotid bruit. I spoke with him and his daughter and this is being evaluated through the [place] [facility]. He has undergone carotid duplex studies and has an upcoming vascular surgery appointment.  Tonny Bollman 08/24/2011

## 2011-12-02 ENCOUNTER — Emergency Department (HOSPITAL_COMMUNITY): Payer: Medicare Other

## 2011-12-02 ENCOUNTER — Inpatient Hospital Stay (HOSPITAL_COMMUNITY): Payer: Medicare Other

## 2011-12-02 ENCOUNTER — Encounter (HOSPITAL_COMMUNITY): Payer: Self-pay | Admitting: *Deleted

## 2011-12-02 ENCOUNTER — Inpatient Hospital Stay (HOSPITAL_COMMUNITY)
Admission: EM | Admit: 2011-12-02 | Discharge: 2011-12-09 | DRG: 982 | Disposition: A | Discharge status: Home Health | Payer: Medicare Other | Attending: Internal Medicine | Admitting: Internal Medicine

## 2011-12-02 DIAGNOSIS — Z86711 Personal history of pulmonary embolism: Secondary | ICD-10-CM

## 2011-12-02 DIAGNOSIS — I480 Paroxysmal atrial fibrillation: Secondary | ICD-10-CM

## 2011-12-02 DIAGNOSIS — L03119 Cellulitis of unspecified part of limb: Secondary | ICD-10-CM | POA: Diagnosis present

## 2011-12-02 DIAGNOSIS — R197 Diarrhea, unspecified: Secondary | ICD-10-CM | POA: Diagnosis present

## 2011-12-02 DIAGNOSIS — F4310 Post-traumatic stress disorder, unspecified: Secondary | ICD-10-CM | POA: Diagnosis present

## 2011-12-02 DIAGNOSIS — R319 Hematuria, unspecified: Secondary | ICD-10-CM | POA: Diagnosis not present

## 2011-12-02 DIAGNOSIS — E785 Hyperlipidemia, unspecified: Secondary | ICD-10-CM

## 2011-12-02 DIAGNOSIS — I10 Essential (primary) hypertension: Secondary | ICD-10-CM

## 2011-12-02 DIAGNOSIS — I2510 Atherosclerotic heart disease of native coronary artery without angina pectoris: Secondary | ICD-10-CM | POA: Diagnosis present

## 2011-12-02 DIAGNOSIS — N183 Chronic kidney disease, stage 3 unspecified: Secondary | ICD-10-CM | POA: Diagnosis present

## 2011-12-02 DIAGNOSIS — E876 Hypokalemia: Secondary | ICD-10-CM | POA: Diagnosis not present

## 2011-12-02 DIAGNOSIS — F411 Generalized anxiety disorder: Secondary | ICD-10-CM | POA: Diagnosis present

## 2011-12-02 DIAGNOSIS — K449 Diaphragmatic hernia without obstruction or gangrene: Secondary | ICD-10-CM | POA: Diagnosis present

## 2011-12-02 DIAGNOSIS — N138 Other obstructive and reflux uropathy: Secondary | ICD-10-CM | POA: Diagnosis not present

## 2011-12-02 DIAGNOSIS — I739 Peripheral vascular disease, unspecified: Secondary | ICD-10-CM | POA: Diagnosis present

## 2011-12-02 DIAGNOSIS — Z9861 Coronary angioplasty status: Secondary | ICD-10-CM

## 2011-12-02 DIAGNOSIS — N1330 Unspecified hydronephrosis: Secondary | ICD-10-CM | POA: Diagnosis not present

## 2011-12-02 DIAGNOSIS — G609 Hereditary and idiopathic neuropathy, unspecified: Secondary | ICD-10-CM | POA: Diagnosis present

## 2011-12-02 DIAGNOSIS — B029 Zoster without complications: Secondary | ICD-10-CM | POA: Diagnosis present

## 2011-12-02 DIAGNOSIS — Z79899 Other long term (current) drug therapy: Secondary | ICD-10-CM

## 2011-12-02 DIAGNOSIS — N179 Acute kidney failure, unspecified: Secondary | ICD-10-CM | POA: Diagnosis present

## 2011-12-02 DIAGNOSIS — I4891 Unspecified atrial fibrillation: Secondary | ICD-10-CM | POA: Diagnosis not present

## 2011-12-02 DIAGNOSIS — K589 Irritable bowel syndrome without diarrhea: Secondary | ICD-10-CM | POA: Diagnosis present

## 2011-12-02 DIAGNOSIS — Z8673 Personal history of transient ischemic attack (TIA), and cerebral infarction without residual deficits: Secondary | ICD-10-CM

## 2011-12-02 DIAGNOSIS — Z7901 Long term (current) use of anticoagulants: Secondary | ICD-10-CM

## 2011-12-02 DIAGNOSIS — Z7982 Long term (current) use of aspirin: Secondary | ICD-10-CM

## 2011-12-02 DIAGNOSIS — Z9181 History of falling: Secondary | ICD-10-CM

## 2011-12-02 DIAGNOSIS — K219 Gastro-esophageal reflux disease without esophagitis: Secondary | ICD-10-CM | POA: Diagnosis present

## 2011-12-02 DIAGNOSIS — K5909 Other constipation: Secondary | ICD-10-CM

## 2011-12-02 DIAGNOSIS — R339 Retention of urine, unspecified: Secondary | ICD-10-CM | POA: Diagnosis not present

## 2011-12-02 DIAGNOSIS — R42 Dizziness and giddiness: Secondary | ICD-10-CM

## 2011-12-02 DIAGNOSIS — L03317 Cellulitis of buttock: Secondary | ICD-10-CM | POA: Diagnosis present

## 2011-12-02 DIAGNOSIS — D696 Thrombocytopenia, unspecified: Secondary | ICD-10-CM

## 2011-12-02 DIAGNOSIS — N401 Enlarged prostate with lower urinary tract symptoms: Secondary | ICD-10-CM | POA: Diagnosis not present

## 2011-12-02 DIAGNOSIS — L0231 Cutaneous abscess of buttock: Secondary | ICD-10-CM | POA: Diagnosis present

## 2011-12-02 DIAGNOSIS — Z86718 Personal history of other venous thrombosis and embolism: Secondary | ICD-10-CM

## 2011-12-02 HISTORY — DX: Transient cerebral ischemic attack, unspecified: G45.9

## 2011-12-02 LAB — CBC WITH DIFFERENTIAL/PLATELET
Basophils Absolute: 0 10*3/uL (ref 0.0–0.1)
Basophils Relative: 1 % (ref 0–1)
HCT: 40 % (ref 39.0–52.0)
Lymphocytes Relative: 27 % (ref 12–46)
MCHC: 34.5 g/dL (ref 30.0–36.0)
Neutro Abs: 2.3 10*3/uL (ref 1.7–7.7)
Neutrophils Relative %: 58 % (ref 43–77)
RDW: 14.2 % (ref 11.5–15.5)
WBC: 3.9 10*3/uL — ABNORMAL LOW (ref 4.0–10.5)

## 2011-12-02 LAB — PROTIME-INR
INR: 1.47 (ref 0.00–1.49)
Prothrombin Time: 17.4 seconds — ABNORMAL HIGH (ref 11.6–15.2)

## 2011-12-02 LAB — COMPREHENSIVE METABOLIC PANEL
ALT: 16 U/L (ref 0–53)
AST: 23 U/L (ref 0–37)
Alkaline Phosphatase: 73 U/L (ref 39–117)
CO2: 24 mEq/L (ref 19–32)
Calcium: 8.4 mg/dL (ref 8.4–10.5)
Chloride: 100 mEq/L (ref 96–112)
GFR calc Af Amer: 54 mL/min — ABNORMAL LOW (ref >90)
GFR calc non Af Amer: 47 mL/min — ABNORMAL LOW (ref >90)
Glucose, Bld: 108 mg/dL — ABNORMAL HIGH (ref 70–99)
Potassium: 3.6 mEq/L (ref 3.5–5.1)
Sodium: 133 mEq/L — ABNORMAL LOW (ref 135–145)
Total Bilirubin: 0.4 mg/dL (ref 0.3–1.2)

## 2011-12-02 LAB — URINALYSIS, ROUTINE W REFLEX MICROSCOPIC
Bilirubin Urine: NEGATIVE
Ketones, ur: NEGATIVE mg/dL
Nitrite: NEGATIVE
Protein, ur: 30 mg/dL — AB
Specific Gravity, Urine: 1.024 (ref 1.005–1.030)
Urobilinogen, UA: 1 mg/dL (ref 0.0–1.0)

## 2011-12-02 LAB — URINE MICROSCOPIC-ADD ON

## 2011-12-02 LAB — APTT: aPTT: 41 seconds — ABNORMAL HIGH (ref 24–37)

## 2011-12-02 MED ORDER — ASPIRIN EC 81 MG PO TBEC
81.0000 mg | DELAYED_RELEASE_TABLET | Freq: Every day | ORAL | Status: DC
  Administered 2011-12-02 – 2011-12-09 (×8): 81 mg via ORAL
  Filled 2011-12-02 (×8): qty 1

## 2011-12-02 MED ORDER — SODIUM CHLORIDE 0.9 % IV BOLUS (SEPSIS)
500.0000 mL | Freq: Once | INTRAVENOUS | Status: AC
  Administered 2011-12-02: 500 mL via INTRAVENOUS

## 2011-12-02 MED ORDER — ACETAMINOPHEN 650 MG RE SUPP
650.0000 mg | Freq: Four times a day (QID) | RECTAL | Status: DC | PRN
  Administered 2011-12-03: 650 mg via RECTAL
  Filled 2011-12-02: qty 1

## 2011-12-02 MED ORDER — ALPRAZOLAM 0.25 MG PO TABS
0.2500 mg | ORAL_TABLET | Freq: Every evening | ORAL | Status: DC | PRN
  Administered 2011-12-04: 0.25 mg via ORAL
  Filled 2011-12-02 (×3): qty 1

## 2011-12-02 MED ORDER — TAMSULOSIN HCL 0.4 MG PO CAPS
0.4000 mg | ORAL_CAPSULE | Freq: Every day | ORAL | Status: DC
  Administered 2011-12-03 – 2011-12-09 (×7): 0.4 mg via ORAL
  Filled 2011-12-02 (×7): qty 1

## 2011-12-02 MED ORDER — HYDROCODONE-ACETAMINOPHEN 5-325 MG PO TABS: 1.0000 | ORAL_TABLET | Freq: Four times a day (QID) | ORAL | Status: DC | PRN

## 2011-12-02 MED ORDER — VANCOMYCIN HCL IN DEXTROSE 1-5 GM/200ML-% IV SOLN
1000.0000 mg | INTRAVENOUS | Status: DC
  Filled 2011-12-02: qty 200

## 2011-12-02 MED ORDER — SODIUM CHLORIDE 0.9 % IV SOLN
INTRAVENOUS | Status: DC
  Administered 2011-12-02: 20:00:00 via INTRAVENOUS

## 2011-12-02 MED ORDER — ONDANSETRON HCL 4 MG PO TABS: 4.0000 mg | ORAL_TABLET | Freq: Four times a day (QID) | ORAL | Status: DC | PRN

## 2011-12-02 MED ORDER — HYDRALAZINE HCL 20 MG/ML IJ SOLN
5.0000 mg | Freq: Four times a day (QID) | INTRAMUSCULAR | Status: DC | PRN
  Administered 2011-12-02: 5 mg via INTRAVENOUS
  Filled 2011-12-02: qty 0.25

## 2011-12-02 MED ORDER — ACETAMINOPHEN 325 MG PO TABS
650.0000 mg | ORAL_TABLET | Freq: Four times a day (QID) | ORAL | Status: DC | PRN
  Administered 2011-12-03 – 2011-12-07 (×5): 650 mg via ORAL
  Filled 2011-12-02: qty 2
  Filled 2011-12-02: qty 1
  Filled 2011-12-02: qty 2
  Filled 2011-12-02: qty 1
  Filled 2011-12-02 (×2): qty 2

## 2011-12-02 MED ORDER — VALACYCLOVIR HCL 500 MG PO TABS
1000.0000 mg | ORAL_TABLET | Freq: Two times a day (BID) | ORAL | Status: DC
  Administered 2011-12-02: 1000 mg via ORAL
  Filled 2011-12-02 (×3): qty 2

## 2011-12-02 MED ORDER — RIVAROXABAN 10 MG PO TABS
5.0000 mg | ORAL_TABLET | Freq: Every evening | ORAL | Status: DC
  Administered 2011-12-02: 5 mg via ORAL
  Filled 2011-12-02 (×2): qty 1

## 2011-12-02 MED ORDER — ONDANSETRON HCL 4 MG/2ML IJ SOLN
4.0000 mg | Freq: Four times a day (QID) | INTRAMUSCULAR | Status: DC | PRN
  Administered 2011-12-02: 4 mg via INTRAVENOUS
  Filled 2011-12-02: qty 2

## 2011-12-02 MED ORDER — VITAMIN D3 25 MCG (1000 UT) PO CAPS: 1.0000 | ORAL_CAPSULE | Freq: Every day | ORAL | Status: DC

## 2011-12-02 MED ORDER — SODIUM CHLORIDE 0.9 % IV SOLN
INTRAVENOUS | Status: DC
  Administered 2011-12-02: 22:00:00 via INTRAVENOUS
  Administered 2011-12-03: 75 mL via INTRAVENOUS
  Administered 2011-12-03: 19:00:00 via INTRAVENOUS
  Administered 2011-12-03: 125 mL via INTRAVENOUS
  Administered 2011-12-04 – 2011-12-06 (×3): via INTRAVENOUS

## 2011-12-02 MED ORDER — HYDROCODONE-ACETAMINOPHEN 5-325 MG PO TABS
1.0000 | ORAL_TABLET | ORAL | Status: DC | PRN
  Filled 2011-12-02: qty 1

## 2011-12-02 MED ORDER — ASPIRIN 81 MG PO TABS: 81.0000 mg | ORAL_TABLET | Freq: Every day | ORAL | Status: DC

## 2011-12-02 MED ORDER — DULOXETINE HCL 30 MG PO CPEP
30.0000 mg | ORAL_CAPSULE | Freq: Every day | ORAL | Status: DC
  Administered 2011-12-02 – 2011-12-08 (×7): 30 mg via ORAL
  Filled 2011-12-02 (×8): qty 1

## 2011-12-02 MED ORDER — PANTOPRAZOLE SODIUM 40 MG PO TBEC
40.0000 mg | DELAYED_RELEASE_TABLET | Freq: Every day | ORAL | Status: DC
  Administered 2011-12-03 – 2011-12-08 (×5): 40 mg via ORAL
  Filled 2011-12-02 (×5): qty 1

## 2011-12-02 MED ORDER — VANCOMYCIN HCL IN DEXTROSE 1-5 GM/200ML-% IV SOLN
1000.0000 mg | Freq: Once | INTRAVENOUS | Status: AC
  Administered 2011-12-02: 1000 mg via INTRAVENOUS
  Filled 2011-12-02: qty 200

## 2011-12-02 MED ORDER — MORPHINE SULFATE 4 MG/ML IJ SOLN
4.0000 mg | INTRAMUSCULAR | Status: DC | PRN
  Administered 2011-12-02: 4 mg via INTRAVENOUS
  Filled 2011-12-02: qty 1

## 2011-12-02 MED ORDER — GABAPENTIN 300 MG PO CAPS
300.0000 mg | ORAL_CAPSULE | Freq: Two times a day (BID) | ORAL | Status: DC
  Administered 2011-12-02 – 2011-12-04 (×4): 300 mg via ORAL
  Filled 2011-12-02 (×6): qty 1

## 2011-12-02 MED ORDER — VITAMIN D3 25 MCG (1000 UNIT) PO TABS
1000.0000 [IU] | ORAL_TABLET | Freq: Every day | ORAL | Status: DC
  Administered 2011-12-03 – 2011-12-09 (×7): 1000 [IU] via ORAL
  Filled 2011-12-02 (×7): qty 1

## 2011-12-02 MED ORDER — IOHEXOL 300 MG/ML  SOLN
20.0000 mL | INTRAMUSCULAR | Status: AC
  Administered 2011-12-02: 20 mL via ORAL

## 2011-12-02 NOTE — ED Notes (Signed)
Unable to get past prostate, Dr Adriana Simas made aware, coude caths at the bedside

## 2011-12-02 NOTE — Progress Notes (Signed)
ANTIBIOTIC CONSULT NOTE - INITIAL  Pharmacy Consult for vancomycin Indication: cellulitis  Allergies  Allergen Reactions  . Amoxicillin-Pot Clavulanate Other (See Comments)    unknown  . Diazepam     REACTION: weak  . Fludrocortisone Acetate Other (See Comments)    unknown  . Scopace (Scopolamine) Other (See Comments)    *patch Unknown reaction    Patient Measurements: Height: 5\' 11"  (180.3 cm) Weight: 180 lb (81.647 kg) IBW/kg (Calculated) : 75.3   Vital Signs: Temp: 100.5 F (38.1 C) (09/21 1547) Temp src: Oral (09/21 1547) BP: 164/83 mmHg (09/21 1547) Pulse Rate: 84  (09/21 2030) Intake/Output from previous day:   Intake/Output from this shift:    Labs:  [facility] 12/02/11 1633  WBC 3.9*  HGB 13.8  PLT 104*  LABCREA --  CREATININE 1.30   Estimated Creatinine Clearance: 40.2 ml/min (by C-G formula based on Cr of 1.3). No results found for this basename: VANCOTROUGH:2,VANCOPEAK:2,VANCORANDOM:2,GENTTROUGH:2,GENTPEAK:2,GENTRANDOM:2,TOBRATROUGH:2,TOBRAPEAK:2,TOBRARND:2,AMIKACINPEAK:2,AMIKACINTROU:2,AMIKACIN:2, in the last 72 hours   Microbiology: No results found for this or any previous visit (from the past 720 hour(s)).  Medical History: Past Medical History  Diagnosis Date  . PVD (peripheral vascular disease)   . CAD (coronary artery disease)   . Hyperlipidemia   . Cerebrovascular accident   . DVT femoral (deep venous thrombosis) with thrombophlebitis   . GERD (gastroesophageal reflux disease)   . Edema   . Anxiety   . Vertigo   . IBS (irritable bowel syndrome)   . Duodenitis   . Gastritis   . Esophageal stricture   . Cellulitis   . Pulmonary embolism   . Prostatic hypertrophy   . Hiatal hernia   . Frequent falls   . Subdural hematoma   . PTSD (post-traumatic stress disorder)   . Vertigo     Medications:  Prescriptions prior to admission  Medication Sig Dispense Refill  . ALPRAZolam (XANAX) 0.25 MG tablet Take 0.25 mg by mouth at bedtime  as needed. anxiety      . aspirin 81 MG tablet Take 81 mg by mouth daily.        . Cholecalciferol (VITAMIN D3) 1000 UNITS CAPS Take 1 capsule by mouth daily.        Tery Sanfilippo Calcium (STOOL SOFTENER PO) Take 1 capsule by mouth at bedtime.      . DULoxetine (CYMBALTA) 30 MG capsule Take 30 mg by mouth at bedtime.      . gabapentin (NEURONTIN) 300 MG capsule Take 300 mg by mouth 2 (two) times daily.      [name] HYDROcodone-acetaminophen (NORCO) 5-325 MG per tablet Take 1 tablet by mouth every 6 (six) hours as needed. Pain       . meclizine (ANTIVERT) 25 MG tablet Take 12.5 mg by mouth as needed. dizziness      . nitroGLYCERIN (NITROSTAT) 0.4 MG SL tablet Place 0.4 mg under the tongue every 5 (five) minutes as needed. Chest pain      . omeprazole (PRILOSEC) 20 MG capsule Take 20 mg by mouth 2 (two) times daily before a meal.       . polyethylene glycol powder (MIRALAX) powder Take 17 g by mouth at bedtime. Constipation       . promethazine (PHENERGAN) 25 MG tablet Take 25 mg by mouth every 6 (six) hours as needed. nausea      . rivaroxaban (XARELTO) 10 MG TABS tablet Take 5 mg by mouth every evening.      . Tamsulosin HCl (FLOMAX) 0.4 MG CAPS Take  0.4 mg by mouth daily.        [name] zolpidem (AMBIEN) 10 MG tablet Take 5 mg by mouth at bedtime as needed. sleep       Assessment: 89 yom presented to the ED with generalized weakness + leg pain. To start empiric vancomycin for cellulitis. Tmax 100.5, WBC low at 3.9. Also started on valtrex as this may be herpes zoster. Received 1gm vanc at 1900 in the ED.  *Also noted that patient is on xarelto for history of PE/DVT. He is on a very odd dose of 5mg  daily and it appears he has been on this for awhile. A more appropriate dose of xarelto for this indication is 20mg  daily.   Goal of Therapy:  Vancomycin trough level 10-15 mcg/ml  Plan:  1. Vancomycin 1gm IV Q24H 2. F/u renal fxn, C&S and clinical course 3. Check a trough at SS 4. Please clarify xarelto  dosing and adjust to a more appropriate dose if indicated  Oronde Hallenbeck, Drake Leach 12/02/2011,10:08 PM

## 2011-12-02 NOTE — ED Notes (Signed)
Pt attempting to void in urinal.

## 2011-12-02 NOTE — H&P (Signed)
[facility] History and Physical  Jason [name] AVW:[id number] DOB: 1919/10/05 DOA: 12/02/2011   PCP: Leo Grosser, MD   Chief Complaint: Generalized weakness and leg pain  HPI:  78 year old male who was in his usual state of health until yesterday after coming home from the [place] in [facility]. In the past 12-24 hours, since yesterday afternoon, the patient has noted increasing generalized weakness. The patient's son is at the bedside to supplement history. The patient's son states that he took 3 people to help him into a wheelchair this afternoon to get him to the emergency department. Normally, the patient is able to ambulate on his own. The patient was also noted to have a fever of 102.18F at around 1:30 PM this afternoon at his house.  Of note, the patient noted left buttock and left thigh pain that started yesterday, and he noticed some redness in these areas. The patient has not been on any antibiotics recently. He states that the pain has worsened today. He also has been complaining of loose stools approximately 6 bowel movements per day for the past 4-5 days. He is also complaining of abdominal pain for the same amount of time. He denies any hematochezia, melena, dysuria, hematuria. He denies any visual changes, focal extremity weakness, chest pain, shortness of breath, nausea, vomiting palpitations, coughing, hemoptysis, nosebleeds. He describes his bowel movements mostly as loose and 2 space like. Today, his son came to see him at his house and noted the patient to be shivering and wearing a sweatshirt. This is when they decided to bring the patient to the emergency department. Assessment/Plan: Herpes zoster dermatitis -Extends from his L5 area to distal third of the left forehead. It does not cross the midline -Start Valtrex, adjusted for his renal function -Certainly, I cannot rule out a secondary bacterial infection, cellulitis -Blood cultures x2 sets, start  vancomycin Abdominal pain/diarrhea -Concerned about diverticulitis -Less likely appendicitis Coronary artery disease -History of bare-metal stent, continue aspirin History of recurrent DVT, legs -Continue rivaroxaban CKD stage III -Serum creatinine appears to be on the upper limits of his baseline, 1.1-1.3 Thrombocytopenia -Check fibrinogen, fibrins split product -Check PT PTT -No active signs of bleeding at this time       Past Medical History  Diagnosis Date  . PVD (peripheral vascular disease)   . CAD (coronary artery disease)   . Hyperlipidemia   . Cerebrovascular accident   . DVT femoral (deep venous thrombosis) with thrombophlebitis   . GERD (gastroesophageal reflux disease)   . Edema   . Anxiety   . Vertigo   . IBS (irritable bowel syndrome)   . Duodenitis   . Gastritis   . Esophageal stricture   . Cellulitis   . Pulmonary embolism   . Prostatic hypertrophy   . Hiatal hernia   . Frequent falls   . Subdural hematoma   . PTSD (post-traumatic stress disorder)   . Vertigo    Past Surgical History  Procedure Date  . Hand surgery   . Angioplasty   . Carotid endarterectomy     Left  . Bare-metal stenting     left circumflex  . Bilateral cataract surgery    Social History:  reports that he has never smoked. He does not have any smokeless tobacco history on file. He reports that he drinks alcohol. He reports that he does not use illicit drugs.  Allergies  Allergen Reactions  . Amoxicillin-Pot Clavulanate Other (See Comments)    unknown  .  Diazepam     REACTION: weak  . Fludrocortisone Acetate Other (See Comments)    unknown  . Scopace (Scopolamine) Other (See Comments)    *patch Unknown reaction    Family History  Problem Relation Age of Onset  . Colon cancer Neg Hx   . Coronary artery disease Brother   . Heart attack Father     Prior to Admission medications   Medication Sig Start Date End Date Taking? Authorizing Provider  ALPRAZolam  (XANAX) 0.25 MG tablet Take 0.25 mg by mouth at bedtime as needed. anxiety   Yes Historical Provider, MD  aspirin 81 MG tablet Take 81 mg by mouth daily.     Yes Historical Provider, MD  Cholecalciferol (VITAMIN D3) 1000 UNITS CAPS Take 1 capsule by mouth daily.     Yes Historical Provider, MD  Docusate Calcium (STOOL SOFTENER PO) Take 1 capsule by mouth at bedtime.   Yes Historical Provider, MD  DULoxetine (CYMBALTA) 30 MG capsule Take 30 mg by mouth at bedtime.   Yes Historical Provider, MD  gabapentin (NEURONTIN) 300 MG capsule Take 300 mg by mouth 2 (two) times daily.   Yes Historical Provider, MD  HYDROcodone-acetaminophen (NORCO) 5-325 MG per tablet Take 1 tablet by mouth every 6 (six) hours as needed. Pain    Yes Historical Provider, MD  meclizine (ANTIVERT) 25 MG tablet Take 12.5 mg by mouth as needed. dizziness   Yes Historical Provider, MD  nitroGLYCERIN (NITROSTAT) 0.4 MG SL tablet Place 0.4 mg under the tongue every 5 (five) minutes as needed. Chest pain   Yes Historical Provider, MD  omeprazole (PRILOSEC) 20 MG capsule Take 20 mg by mouth 2 (two) times daily before a meal.    Yes Historical Provider, MD  polyethylene glycol powder (MIRALAX) powder Take 17 g by mouth at bedtime. Constipation    Yes Historical Provider, MD  promethazine (PHENERGAN) 25 MG tablet Take 25 mg by mouth every 6 (six) hours as needed. nausea   Yes Historical Provider, MD  rivaroxaban (XARELTO) 10 MG TABS tablet Take 5 mg by mouth every evening.   Yes Historical Provider, MD  Tamsulosin HCl (FLOMAX) 0.4 MG CAPS Take 0.4 mg by mouth daily.     Yes Historical Provider, MD  zolpidem (AMBIEN) 10 MG tablet Take 5 mg by mouth at bedtime as needed. sleep   Yes Historical Provider, MD    Review of Systems:  Constitutional:  No weight loss, night sweats Head&Eyes:  No vision loss.  No eye pain or scotoma; +headache ENT:  No Difficulty swallowing,Tooth/dental problems,Sore throat,  No ear ache, post nasal drip,    Cardio-vascular:  No chest pain, Orthopnea, PND, swelling in lower extremities\ palpitations  GI:  No heartburn, indigestion, nausea, vomiting, diarrhea,loss of appetite, hematochezia, melena Resp:  No shortness of breath with exertion or at rest. No excess mucus, no productive cough, No non-productive cough, No coughing up of blood.No change in color of mucus.No wheezing.No chest wall deformity  Skin:  no rash or lesions.  GU:  no dysuria, change in color of urine, no urgency or frequency. No flank pain.  Musculoskeletal:   No decreased range of motion. Chronic back pain Psych:  No change in mood or affect. No depression or anxiety. Neurologic: No headache, no dysesthesia, no focal weakness, no vision loss. No syncope  Physical Exam: Filed Vitals:   12/02/11 1547  BP: 164/83  Pulse: 85  Temp: 100.5 F (38.1 C)  TempSrc: Oral  Resp: 32  Height:  5\' 11"  (1.803 m)  Weight: 81.647 kg (180 lb)  SpO2: 95%   General:  A&O x 3, NAD, nontoxic, pleasant/cooperative Head/Eye: No conjunctival hemorrhage, no icterus, [place]/AT, No nystagmus ENT:  No icterus,  No thrush, good dentition, no pharyngeal exudate Neck:  No masses, no lymphadenpathy, no bruits CV:  RRR, no rub, no gallop, no S3 Lung:  CTAB, good air movement, no wheeze, no rhonchi Abdomen: soft, +BS, nondistended, no peritoneal signs. Tender left lower quadrant, right lower quadrant, no rebound tenderness Ext: No cyanosis, No rashes, No petechiae. Macular papular erythematous rash with scattered purplish papules/vesicles extending from L5 on the left done that but talked down to the distal third of the ventral thigh; no crepitance, no necrosis Neuro: CNII-XII intact, strength 4/5 in bilateral upper and lower extremities, no dysmetria  Labs on Admission:  Basic Metabolic Panel:  Lab 12/02/11 7829  NA 133*  K 3.6  CL 100  CO2 24  GLUCOSE 108*  BUN 16  CREATININE 1.30  CALCIUM 8.4  MG --  PHOS --   Liver Function  Tests:  Lab 12/02/11 1633  AST 23  ALT 16  ALKPHOS 73  BILITOT 0.4  PROT 6.5  ALBUMIN 3.1*   No results found for this basename: LIPASE:5,AMYLASE:5 in the last 168 hours No results found for this basename: AMMONIA:5 in the last 168 hours CBC:  Lab 12/02/11 1633  WBC 3.9*  NEUTROABS 2.3  HGB 13.8  HCT 40.0  MCV 95.9  PLT 104*   Cardiac Enzymes: No results found for this basename: CKTOTAL:5,CKMB:5,CKMBINDEX:5,TROPONINI:5 in the last 168 hours BNP: No components found with this basename: POCBNP:5 CBG: No results found for this basename: GLUCAP:5 in the last 168 hours  Radiological Exams on Admission: Dg Chest Port 1 View  12/02/2011  *RADIOLOGY REPORT*  Clinical Data: Fever, weakness, shortness of breath  PORTABLE CHEST - 1 VIEW  Comparison: 11/13/2010  Findings: Lungs are essentially clear.  No focal dilatation. No pleural effusion or pneumothorax.  The heart is normal in size.  IMPRESSION: No evidence of acute cardiopulmonary disease.   Original Report Authenticated By: Charline Bills, M.D.        Time spend:70 minutes Code Status: full Family Communication: Son is at bedside   Jason Kolbeck, DO  [facility] Pager [phone]  If 7PM-7AM, please contact night-coverage [email or web address] Password [facility] 12/02/2011, 7:57 PM

## 2011-12-02 NOTE — ED Provider Notes (Signed)
History     CSN: [id number]  Arrival date & time 12/02/11  1545   First MD Initiated Contact with Patient 12/02/11 1551      Chief Complaint  Patient presents with  . Shortness of Breath  . Fever  . Weakness    (Consider location/radiation/quality/duration/timing/severity/associated sxs/prior treatment) HPI... level V caveat for mild dementia and urgent need for intervention.   Weakness, fever, skin rash for 18 hours.  Son reports patient was normal yesterday. He became ill in the middle of the night.  No chest pain, dyspnea, dysuria, stiff neck.   Past Medical History  Diagnosis Date  . PVD (peripheral vascular disease)   . CAD (coronary artery disease)   . Hyperlipidemia   . Cerebrovascular accident   . DVT femoral (deep venous thrombosis) with thrombophlebitis   . GERD (gastroesophageal reflux disease)   . Edema   . Anxiety   . Vertigo   . IBS (irritable bowel syndrome)   . Duodenitis   . Gastritis   . Esophageal stricture   . Cellulitis   . Pulmonary embolism   . Prostatic hypertrophy   . Hiatal hernia   . Frequent falls   . Subdural hematoma   . PTSD (post-traumatic stress disorder)   . Vertigo     Past Surgical History  Procedure Date  . Hand surgery   . Angioplasty   . Carotid endarterectomy     Left  . Bare-metal stenting     left circumflex  . Bilateral cataract surgery     Family History  Problem Relation Age of Onset  . Colon cancer Neg Hx   . Coronary artery disease Brother   . Heart attack Father     History  Substance Use Topics  . Smoking status: Never Smoker   . Smokeless tobacco: Not on file  . Alcohol Use: Yes     Wine occasional      Review of Systems  Unable to perform ROS: Other    Allergies  Amoxicillin-pot clavulanate; Diazepam; Fludrocortisone acetate; and Scopace  Home Medications   Current Outpatient Rx  Name Route Sig Dispense Refill  . ALPRAZOLAM 0.25 MG PO TABS Oral Take 0.25 mg by mouth at bedtime as  needed. anxiety    . ASPIRIN 81 MG PO TABS Oral Take 81 mg by mouth daily.      [name] VITAMIN D3 1000 UNITS PO CAPS Oral Take 1 capsule by mouth daily.      . STOOL SOFTENER PO Oral Take 1 capsule by mouth at bedtime.    . DULOXETINE HCL 30 MG PO CPEP Oral Take 30 mg by mouth at bedtime.    [name] GABAPENTIN 300 MG PO CAPS Oral Take 300 mg by mouth 2 (two) times daily.    [name] HYDROCODONE-ACETAMINOPHEN 5-325 MG PO TABS Oral Take 1 tablet by mouth every 6 (six) hours as needed. Pain     . MECLIZINE HCL 25 MG PO TABS Oral Take 12.5 mg by mouth as needed. dizziness    . NITROGLYCERIN 0.4 MG SL SUBL Sublingual Place 0.4 mg under the tongue every 5 (five) minutes as needed. Chest pain    . OMEPRAZOLE 20 MG PO CPDR Oral Take 20 mg by mouth 2 (two) times daily before a meal.     . POLYETHYLENE GLYCOL 3350 PO POWD Oral Take 17 g by mouth at bedtime. Constipation     . PROMETHAZINE HCL 25 MG PO TABS Oral Take 25 mg by mouth every 6 (  six) hours as needed. nausea    . RIVAROXABAN 10 MG PO TABS Oral Take 5 mg by mouth every evening.    [name] TAMSULOSIN HCL 0.4 MG PO CAPS Oral Take 0.4 mg by mouth daily.      [name] ZOLPIDEM TARTRATE 10 MG PO TABS Oral Take 5 mg by mouth at bedtime as needed. sleep      BP 164/83  Pulse 85  Temp 100.5 F (38.1 C) (Oral)  Resp 32  Ht 5\' 11"  (1.803 m)  Wt 180 lb (81.647 kg)  BMI 25.10 kg/m2  SpO2 95%  Physical Exam  Nursing note and vitals reviewed. Constitutional: He is oriented to person, place, and time.       Slightly dehydrated and confused  HENT:  Head: Normocephalic and atraumatic.  Eyes: Conjunctivae normal and EOM are normal. Pupils are equal, round, and reactive to light.  Neck: Normal range of motion. Neck supple.  Cardiovascular: Normal rate, regular rhythm and normal heart sounds.   Pulmonary/Chest: Effort normal and breath sounds normal.  Abdominal: Soft. Bowel sounds are normal.  Musculoskeletal: Normal range of motion.  Neurological: He is alert and oriented to  person, place, and time.  Skin:       Diffuse erythematous area on the left buttocks and left lower back consistent with cellulitis  Psychiatric: He has a normal mood and affect.    ED Course  Procedures (including critical care time)  Labs Reviewed  CBC WITH DIFFERENTIAL - Abnormal; Notable for the following:    WBC 3.9 (*)     RBC 4.17 (*)     Platelets 104 (*)  PLATELET COUNT CONFIRMED BY SMEAR   All other components within normal limits  COMPREHENSIVE METABOLIC PANEL - Abnormal; Notable for the following:    Sodium 133 (*)     Glucose, Bld 108 (*)     Albumin 3.1 (*)     GFR calc non Af Amer 47 (*)     GFR calc Af Amer 54 (*)     All other components within normal limits  URINALYSIS, ROUTINE W REFLEX MICROSCOPIC - Abnormal; Notable for the following:    APPearance CLOUDY (*)     Hgb urine dipstick LARGE (*)     Protein, ur 30 (*)     All other components within normal limits  URINE MICROSCOPIC-ADD ON  CULTURE, BLOOD (ROUTINE X 2)  CULTURE, BLOOD (ROUTINE X 2)  URINE CULTURE   Dg Chest Port 1 View  12/02/2011  *RADIOLOGY REPORT*  Clinical Data: Fever, weakness, shortness of breath  PORTABLE CHEST - 1 VIEW  Comparison: 11/13/2010  Findings: Lungs are essentially clear.  No focal dilatation. No pleural effusion or pneumothorax.  The heart is normal in size.  IMPRESSION: No evidence of acute cardiopulmonary disease.   Original Report Authenticated By: Charline Bills, M.D.      No diagnosis found.    MDM  I suspect source of patient's fever and altered mental status is the cellulitis on his buttocks. IV vancomycin started. Discussed with hospitalist. Admit        Donnetta Hutching, MD 12/02/11 1921

## 2011-12-02 NOTE — ED Notes (Signed)
Patient reports onset of feeling sick last night.  Patient is sob at rest, weakness noted, and fever reported.  Patient got up to use urine 7 x night and bm x 2

## 2011-12-03 DIAGNOSIS — B029 Zoster without complications: Principal | ICD-10-CM

## 2011-12-03 DIAGNOSIS — F411 Generalized anxiety disorder: Secondary | ICD-10-CM

## 2011-12-03 DIAGNOSIS — L0231 Cutaneous abscess of buttock: Secondary | ICD-10-CM

## 2011-12-03 DIAGNOSIS — N183 Chronic kidney disease, stage 3 unspecified: Secondary | ICD-10-CM

## 2011-12-03 LAB — BASIC METABOLIC PANEL
Calcium: 7.9 mg/dL — ABNORMAL LOW (ref 8.4–10.5)
Creatinine, Ser: 1.94 mg/dL — ABNORMAL HIGH (ref 0.50–1.35)
GFR calc Af Amer: 33 mL/min — ABNORMAL LOW (ref >90)
GFR calc non Af Amer: 29 mL/min — ABNORMAL LOW (ref >90)
Sodium: 132 mEq/L — ABNORMAL LOW (ref 135–145)

## 2011-12-03 LAB — CBC
MCH: 32.8 pg (ref 26.0–34.0)
MCHC: 34.7 g/dL (ref 30.0–36.0)
MCV: 94.7 fL (ref 78.0–100.0)
Platelets: 97 10*3/uL — ABNORMAL LOW (ref 150–400)
RDW: 14 % (ref 11.5–15.5)

## 2011-12-03 LAB — PROCALCITONIN: Procalcitonin: 0.27 ng/mL

## 2011-12-03 MED ORDER — HEPARIN (PORCINE) IN NACL 100-0.45 UNIT/ML-% IJ SOLN
1000.0000 [IU]/h | INTRAMUSCULAR | Status: DC
  Administered 2011-12-03: 1300 [IU]/h via INTRAVENOUS
  Administered 2011-12-04: 1000 [IU]/h via INTRAVENOUS
  Filled 2011-12-03 (×2): qty 250

## 2011-12-03 MED ORDER — ONDANSETRON HCL 4 MG PO TABS: 4.0000 mg | ORAL_TABLET | ORAL | Status: DC | PRN

## 2011-12-03 MED ORDER — ONDANSETRON HCL 4 MG/2ML IJ SOLN
4.0000 mg | INTRAMUSCULAR | Status: DC | PRN
  Administered 2011-12-03 – 2011-12-04 (×2): 4 mg via INTRAVENOUS
  Filled 2011-12-03 (×2): qty 2

## 2011-12-03 MED ORDER — LATANOPROST 0.005 % OP SOLN
1.0000 [drp] | Freq: Every day | OPHTHALMIC | Status: DC
  Administered 2011-12-03 – 2011-12-08 (×6): 1 [drp] via OPHTHALMIC

## 2011-12-03 MED ORDER — CLONIDINE HCL 0.1 MG PO TABS
0.1000 mg | ORAL_TABLET | Freq: Once | ORAL | Status: AC
  Administered 2011-12-03: 0.1 mg via ORAL
  Filled 2011-12-03: qty 1

## 2011-12-03 MED ORDER — VALACYCLOVIR HCL 500 MG PO TABS
1000.0000 mg | ORAL_TABLET | Freq: Every day | ORAL | Status: DC
  Administered 2011-12-03 – 2011-12-04 (×2): 1000 mg via ORAL
  Filled 2011-12-03 (×2): qty 2

## 2011-12-03 MED ORDER — HYDRALAZINE HCL 20 MG/ML IJ SOLN
5.0000 mg | Freq: Once | INTRAMUSCULAR | Status: DC
  Filled 2011-12-03: qty 0.25

## 2011-12-03 MED ORDER — HYDRALAZINE HCL 20 MG/ML IJ SOLN
10.0000 mg | Freq: Four times a day (QID) | INTRAMUSCULAR | Status: DC | PRN
Start: 2011-12-03 — End: 2011-12-09
  Administered 2011-12-03: 10 mg via INTRAVENOUS
  Administered 2011-12-03: 5 mg via INTRAVENOUS
  Filled 2011-12-03 (×2): qty 0.5

## 2011-12-03 MED ORDER — POTASSIUM CHLORIDE CRYS ER 20 MEQ PO TBCR
40.0000 meq | EXTENDED_RELEASE_TABLET | Freq: Once | ORAL | Status: AC
  Administered 2011-12-03: 40 meq via ORAL
  Filled 2011-12-03: qty 2

## 2011-12-03 MED ORDER — VANCOMYCIN HCL 1000 MG IV SOLR
750.0000 mg | INTRAVENOUS | Status: DC
  Administered 2011-12-03: 750 mg via INTRAVENOUS
  Filled 2011-12-03 (×2): qty 750

## 2011-12-03 NOTE — Progress Notes (Addendum)
[facility] PROGRESS NOTE  Jason [name] ZOX:[id number] DOB: 1919/04/09 DOA: 12/02/2011 PCP: Leo Grosser, MD  Assessment/Plan: Active Problems:  Herpes zoster  Cellulitis, leg  Thrombocytopenia  CAD (coronary artery disease)  CKD (chronic kidney disease), stage III   Herpes zoster dermatitis  -Extends from his L5 area to distal third of the left forehead. It does not cross the midline  -Start Valtrex, adjusted for his renal function  -Certainly, I cannot rule out a secondary bacterial infection, cellulitis  -Blood cultures x2 sets, start vancomycin.  Abdominal pain/diarrhea  -Concerned about diverticulitis  -CT abdomen and pelvis shows no acute findings.   Coronary artery disease  -History of bare-metal stent, continue aspirin   History of recurrent DVT, legs  -Patient is Xarelto, his renal function is deteriorating. I'll hold that. -Will start him on heparin.  CKD stage III  -Serum creatinine appears to be on the upper limits of his baseline, 1.1-1.3.   Thrombocytopenia  -Check fibrinogen, FDP. -No active signs of bleeding at this time   Code Status:  Full code Family Communication:  Disposition Plan:    Brief narrative: 78 year old male with past medical history of hypertension and diabetes mellitus came in to the hospital with a fever of 101.6. He does have unilateral rash consistent with shingles, opportunistic infection cannot be ruled out.  Consultants:  None  Procedures:  None  Antibiotics:  Vancomycin started on 12/02/2011  HPI/Subjective: Have some fever and chills, does not feel good.   Objective: Filed Vitals:   12/03/11 0047 12/03/11 0315 12/03/11 0440 12/03/11 0615  BP: 182/79 181/78 168/74   Pulse:   79   Temp:  98.4 F (36.9 C) 101.9 F (38.8 C) 101.6 F (38.7 C)  TempSrc:  Oral Axillary Oral  Resp:   18   Height:      Weight:      SpO2:   91%     Intake/Output Summary (Last 24 hours) at 12/03/11 1042 Last data  filed at 12/03/11 0700  Gross per 24 hour  Intake    240 ml  Output   1100 ml  Net   -860 ml   Filed Weights   12/02/11 1547 12/02/11 2216  Weight: 81.647 kg (180 lb) 84.1 kg (185 lb 6.5 oz)    Exam:  General: Alert and awake, oriented x3, not in any acute distress. HEENT: anicteric sclera, pupils reactive to light and accommodation, EOMI CVS: S1-S2 clear, no murmur rubs or gallops Chest: clear to auscultation bilaterally, no wheezing, rales or rhonchi Abdomen: soft nontender, nondistended, normal bowel sounds, no organomegaly Extremities: no cyanosis, clubbing or edema noted bilaterally Neuro: Cranial nerves II-XII intact, no focal neurological deficits  Data Reviewed: Basic Metabolic Panel:  Lab 12/03/11 8119 12/02/11 1633  NA 132* 133*  K 3.6 3.6  CL 100 100  CO2 21 24  GLUCOSE 93 108*  BUN 18 16  CREATININE 1.94* 1.30  CALCIUM 7.9* 8.4  MG -- --  PHOS -- --   Liver Function Tests:  Lab 12/02/11 1633  AST 23  ALT 16  ALKPHOS 73  BILITOT 0.4  PROT 6.5  ALBUMIN 3.1*   No results found for this basename: LIPASE:5,AMYLASE:5 in the last 168 hours No results found for this basename: AMMONIA:5 in the last 168 hours CBC:  Lab 12/03/11 0750 12/02/11 1633  WBC 5.8 3.9*  NEUTROABS -- 2.3  HGB 13.1 13.8  HCT 37.8* 40.0  MCV 94.7 95.9  PLT 97* 104*   Cardiac Enzymes:  Lab 12/02/11 2019  CKTOTAL 89  CKMB --  CKMBINDEX --  TROPONINI --   BNP (last 3 results) No results found for this basename: PROBNP:3 in the last 8760 hours CBG: No results found for this basename: GLUCAP:5 in the last 168 hours  No results found for this or any previous visit (from the past 240 hour(s)).   Studies: Ct Abdomen Pelvis Wo Contrast  12/03/2011  *RADIOLOGY REPORT*  Clinical Data: Abdominal pain  CT ABDOMEN AND PELVIS WITHOUT CONTRAST  Technique:  Multidetector CT imaging of the abdomen and pelvis was performed following the standard protocol without intravenous contrast.   Comparison: 04/25/2011  Findings: Probable dependent atelectasis in the bilateral lung bases.  Small hiatal hernia.  Unenhanced liver, spleen, pancreas, and adrenal glands are within normal limits.  Gallbladder is unremarkable.  No intrahepatic or extrahepatic ductal dilatation.  At least two punctate nonobstructing right renal calculi. Bilateral renal sinus cysts.  Extrarenal pelves.  No hydronephrosis.  No evidence of bowel obstruction.  Normal appendix.  Atherosclerotic calcifications of the abdominal aorta and branch vessels.  No abdominopelvic ascites.  No suspicious abdominopelvic lymphadenopathy.  Prostate is unremarkable.  No ureteral or bladder calculi.  Small fat-containing left inguinal hernia.  Degenerative changes of the visualized thoracolumbar spine.  IMPRESSION: No evidence of bowel obstruction.  Normal appendix.  Two punctate nonobstructing right renal calculi.  No ureteral or bladder calculi.  No hydronephrosis.  No CT findings to account for the patient's abdominal pain.   Original Report Authenticated By: Charline Bills, M.D.    Dg Chest Port 1 View  12/02/2011  *RADIOLOGY REPORT*  Clinical Data: Fever, weakness, shortness of breath  PORTABLE CHEST - 1 VIEW  Comparison: 11/13/2010  Findings: Lungs are essentially clear.  No focal dilatation. No pleural effusion or pneumothorax.  The heart is normal in size.  IMPRESSION: No evidence of acute cardiopulmonary disease.   Original Report Authenticated By: Charline Bills, M.D.     Scheduled Meds:   . aspirin EC  81 mg Oral Daily  . cholecalciferol  1,000 Units Oral Daily  . cloNIDine  0.1 mg Oral Once  . DULoxetine  30 mg Oral QHS  . gabapentin  300 mg Oral BID  . hydrALAZINE  5 mg Intravenous Once  . iohexol  20 mL Oral Q1 Hr x 2  . pantoprazole  40 mg Oral Q1200  . rivaroxaban  5 mg Oral QPM  . sodium chloride  500 mL Intravenous Once  . sodium chloride  500 mL Intravenous Once  . Tamsulosin HCl  0.4 mg Oral Daily  .  valACYclovir  1,000 mg Oral Daily  . vancomycin  750 mg Intravenous Q24H  . vancomycin  1,000 mg Intravenous Once  . DISCONTD: aspirin  81 mg Oral Daily  . DISCONTD: valACYclovir  1,000 mg Oral BID  . DISCONTD: vancomycin  1,000 mg Intravenous Q24H  . DISCONTD: Vitamin D3  1 capsule Oral Daily   Continuous Infusions:   . sodium chloride 75 mL (12/03/11 1029)  . DISCONTD: sodium chloride 125 mL/hr at 12/02/11 2007    Active Problems:  Herpes zoster  Cellulitis, leg  Thrombocytopenia  CAD (coronary artery disease)  CKD (chronic kidney disease), stage III    Time spent: 40 minutes    [facility] A  [facility] Pager [phone]. If 8PM-8AM, please contact night-coverage at [email or web address], password [facility] 12/03/2011, 10:42 AM  LOS: 1 day

## 2011-12-03 NOTE — Progress Notes (Signed)
ANTIBIOTIC CONSULT NOTE - FOLLOW UP  Pharmacy Consult for vancomycin Indication: cellulitis  Allergies  Allergen Reactions  . Amoxicillin-Pot Clavulanate Other (See Comments)    unknown  . Diazepam     REACTION: weak  . Fludrocortisone Acetate Other (See Comments)    unknown  . Scopace (Scopolamine) Other (See Comments)    *patch Unknown reaction    Patient Measurements: Height: 5' 1.32" (155.8 cm) Weight: 185 lb 6.5 oz (84.1 kg) IBW/kg (Calculated) : 53.04    Vital Signs: Temp: 101.6 F (38.7 C) (09/22 0615) Temp src: Oral (09/22 0615) BP: 168/74 mmHg (09/22 0440) Pulse Rate: 79  (09/22 0440) Intake/Output from previous day: 09/21 0701 - 09/22 0700 In: 240 [P.O.:240] Out: 1100 [Urine:1100] Intake/Output from this shift:    Labs:  Basename 12/03/11 0750 12/02/11 1633  WBC 5.8 3.9*  HGB 13.1 13.8  PLT 97* 104*  LABCREA -- --  CREATININE 1.94* 1.30   Estimated Creatinine Clearance: 23.4 ml/min (by C-G formula based on Cr of 1.94). No results found for this basename: VANCOTROUGH:2,VANCOPEAK:2,VANCORANDOM:2,GENTTROUGH:2,GENTPEAK:2,GENTRANDOM:2,TOBRATROUGH:2,TOBRAPEAK:2,TOBRARND:2,AMIKACINPEAK:2,AMIKACINTROU:2,AMIKACIN:2, in the last 72 hours   Microbiology: No results found for this or any previous visit (from the past 720 hour(s)).  Anti-infectives     Start     Dose/Rate Route Frequency Ordered Stop   12/03/11 1800   vancomycin (VANCOCIN) IVPB 1000 mg/200 mL premix  Status:  Discontinued        1,000 mg 200 mL/hr over 60 Minutes Intravenous Every 24 hours 12/02/11 2208 12/03/11 1005   12/03/11 1800   vancomycin (VANCOCIN) 750 mg in sodium chloride 0.9 % 150 mL IVPB        750 mg 150 mL/hr over 60 Minutes Intravenous Every 24 hours 12/03/11 1005     12/03/11 1100   valACYclovir (VALTREX) tablet 1,000 mg        1,000 mg Oral Daily 12/03/11 1019     12/02/11 2200   valACYclovir (VALTREX) tablet 1,000 mg  Status:  Discontinued        1,000 mg Oral 2 times  daily 12/02/11 2149 12/03/11 1019   12/02/11 1845   vancomycin (VANCOCIN) IVPB 1000 mg/200 mL premix        1,000 mg 200 mL/hr over 60 Minutes Intravenous  Once 12/02/11 1837 12/02/11 2008          Assessment: 78 y.o male presented to ED with generalized weakness, leg pain and fever of 102. Rx to dose vanc for possible cellulitis. WBC 5.8, current T max 101.6. Received vanc in ED.  Patients scr has increased to 1.94 (from 1.3 yesterday)  Goal of Therapy:  Vancomycin trough level 10-15 mcg/ml  Plan:  1. Adjusted vancomycin dose to 750mg  Q24h 2. F/u renal fxn and cultures 3. Consider random level if scr continues to decline 4. Follow up change in anticoagulant based on patients renal function  Bola A. Wandra Feinstein D Clinical Pharmacist Pager:[phone] Phone [phone] 12/03/2011 10:28 AM

## 2011-12-03 NOTE — Progress Notes (Signed)
Reapplied condom cath. Previous one was leaking. Urine in cath bag was not total amount of pt urine. Peter Congo RN

## 2011-12-03 NOTE — Progress Notes (Signed)
Patient arrived from ED via stretcher.  He from home with his wife with 24 hour private care.  He is alert and oriented times four.  HOH.  Ambulatory with assist.  Patient has shingles rash extending from left buttocks down to back of left thigh and scrotum.  Condom cath.  Son is at bedside.  Patient was oriented to unit, call bell system, and safety measures and acknowledges understanding of each.  POC: control pain and safety.  Will continue to monitor patient.

## 2011-12-03 NOTE — Progress Notes (Signed)
ANTICOAGULATION CONSULT NOTE - Follow Up Consult  Pharmacy Consult for heparin Indication: hx of DVT/PE  Allergies  Allergen Reactions  . Amoxicillin-Pot Clavulanate Nausea And Vomiting    Unknown-daughter states he can tolerate Amoxicillin ok  . Fludrocortisone Acetate Other (See Comments)    unknown  . Scopace (Scopolamine) Other (See Comments)    *patch Unknown reaction    Patient Measurements: Height: 5' 1.32" (155.8 cm) Weight: 185 lb 6.5 oz (84.1 kg) IBW/kg (Calculated) : 53.04  Heparin Dosing Weight: 71.5kg  Vital Signs: Temp: 100.3 F (37.9 C) (09/22 2147) Temp src: Oral (09/22 2147) BP: 166/75 mmHg (09/22 2147) Pulse Rate: 63  (09/22 2147)  Labs:  Basename 12/03/11 2210 12/03/11 2157 12/03/11 0750 12/02/11 2019 12/02/11 1633  HGB -- -- 13.1 -- 13.8  HCT -- -- 37.8* -- 40.0  PLT -- -- 97* -- 104*  APTT 88* -- -- 41* --  LABPROT -- -- -- 17.4* --  INR -- -- -- 1.47 --  HEPARINUNFRC -- 1.78* -- -- --  CREATININE -- -- 1.94* -- 1.30  CKTOTAL -- -- -- 89 --  CKMB -- -- -- -- --  TROPONINI -- -- -- -- --    Estimated Creatinine Clearance: 23.4 ml/min (by C-G formula based on Cr of 1.94).   Medications:  Scheduled:     . aspirin EC  81 mg Oral Daily  . cholecalciferol  1,000 Units Oral Daily  . cloNIDine  0.1 mg Oral Once  . DULoxetine  30 mg Oral QHS  . gabapentin  300 mg Oral BID  . hydrALAZINE  5 mg Intravenous Once  . latanoprost  1 drop Both Eyes QHS  . pantoprazole  40 mg Oral Q1200  . potassium chloride  40 mEq Oral Once  . Tamsulosin HCl  0.4 mg Oral Daily  . valACYclovir  1,000 mg Oral Daily  . vancomycin  750 mg Intravenous Q24H  . DISCONTD: rivaroxaban  5 mg Oral QPM  . DISCONTD: valACYclovir  1,000 mg Oral BID  . 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  Rivaroxaban contraindicated due to  current renal function, therefore anticoagulation changed to IV heparin. Elevated heparin level (1.78) due to residual rivaroxaban. Will monitor heparin using aPTT until rivaroxaban effects on Xa (heparin level) diminish. APTT (88 seconds) is at-goal on heparin 1300 units/hr.   Goal of Therapy:  APTT 66 - 102 seconds Heparin level 0.3-0.7 units/ml Monitor platelets by anticoagulation protocol: Yes   Plan:  1. Continue IV heparin at 1300 units/hr 2. Daily aPTT (until rivaroxaban effects diminish), CBC, heparin level,   Lorre Munroe, PharmD  12/03/2011 11:54 PM

## 2011-12-03 NOTE — Progress Notes (Addendum)
Physician notified several times throughout shift that patient has had elevated BPs highest was 196/89.  Two doses of hydralazine given and PO clonidine given.  BP is now 168/74.  Patient has also had bouts of nausea/vomiting throughout night.  PRN zofran frequency order was changed to Q4H Prn.  Now patient has a fever of 101.9 and has been given tylenol suppository.  Physician once again notified of fever and new BP; states to continue to monitor patient, am doctors will round.  Will continue to monitor and record patient progress.  Could not place foley in ED and ordered condom cath for patient's continual incontinence but penile skin breakdown is evident.  MD please consider specialist placing cath to prevent further breakdown.  Thanks.

## 2011-12-03 NOTE — Progress Notes (Signed)
ANTICOAGULATION CONSULT NOTE - Follow Up Consult  Pharmacy Consult for heparin Indication: hx of DVT/PE  Allergies  Allergen Reactions  . Amoxicillin-Pot Clavulanate Nausea And Vomiting    unknown  . Diazepam     REACTION: weak  . Fludrocortisone Acetate Other (See Comments)    unknown  . Scopace (Scopolamine) Other (See Comments)    *patch Unknown reaction    Patient Measurements: Height: 5' 1.32" (155.8 cm) Weight: 185 lb 6.5 oz (84.1 kg) IBW/kg (Calculated) : 53.04  Heparin Dosing Weight: 71.5kg  Vital Signs: Temp: 101.6 F (38.7 C) (09/22 0615) Temp src: Oral (09/22 0615) BP: 168/74 mmHg (09/22 0440) Pulse Rate: 79  (09/22 0440)  Labs:  Basename 12/03/11 0750 12/02/11 2019 12/02/11 1633  HGB 13.1 -- 13.8  HCT 37.8* -- 40.0  PLT 97* -- 104*  APTT -- 41* --  LABPROT -- 17.4* --  INR -- 1.47 --  HEPARINUNFRC -- -- --  CREATININE 1.94* -- 1.30  CKTOTAL -- 89 --  CKMB -- -- --  TROPONINI -- -- --    Estimated Creatinine Clearance: 23.4 ml/min (by C-G formula based on Cr of 1.94).   Medications:  Scheduled:    . aspirin EC  81 mg Oral Daily  . cholecalciferol  1,000 Units Oral Daily  . cloNIDine  0.1 mg Oral Once  . DULoxetine  30 mg Oral QHS  . gabapentin  300 mg Oral BID  . hydrALAZINE  5 mg Intravenous Once  . iohexol  20 mL Oral Q1 Hr x 2  . pantoprazole  40 mg Oral Q1200  . potassium chloride  40 mEq Oral Once  . sodium chloride  500 mL Intravenous Once  . sodium chloride  500 mL Intravenous Once  . Tamsulosin HCl  0.4 mg Oral Daily  . valACYclovir  1,000 mg Oral Daily  . vancomycin  750 mg Intravenous Q24H  . vancomycin  1,000 mg Intravenous Once  . DISCONTD: aspirin  81 mg Oral Daily  . DISCONTD: rivaroxaban  5 mg Oral QPM  . DISCONTD: valACYclovir  1,000 mg Oral BID  . DISCONTD: vancomycin  1,000 mg Intravenous Q24H  . DISCONTD: Vitamin D3  1 capsule Oral Daily    Assessment: 78 y.o male  admitted for possible cellulitis with history  of VTE and PE on xarelto (lifelong, per pt's daughter) PTA.  Scr trending up 1.3>1.94 ~ crcl 23.4. Xarelto contraindicated, therefore consulted to switch to heparin. Per daughter, patient has history of "brain bleed" on coumadin after falling.  Goal of Therapy:  Heparin level 0.3-0.7 units/ml Monitor platelets by anticoagulation protocol: Yes   Plan:  1. Will not give heparin bolus 2. Initiate heparin drip 1300 units/hour 3. Check 8 hour heparin level 4. Monitor CBC and s/sx of bleeding 5. Follow up discharge dose of xarelto, was admitted on very low dose 5 mg?  Bola A. Wandra Feinstein D Clinical Pharmacist Pager:[phone] Phone [phone] 12/03/2011 1:07 PM

## 2011-12-04 ENCOUNTER — Inpatient Hospital Stay (HOSPITAL_COMMUNITY): Payer: Medicare Other

## 2011-12-04 LAB — BASIC METABOLIC PANEL
BUN: 32 mg/dL — ABNORMAL HIGH (ref 6–23)
Calcium: 8 mg/dL — ABNORMAL LOW (ref 8.4–10.5)
Chloride: 102 mEq/L (ref 96–112)
Creatinine, Ser: 4.36 mg/dL — ABNORMAL HIGH (ref 0.50–1.35)
GFR calc Af Amer: 13 mL/min — ABNORMAL LOW (ref >90)

## 2011-12-04 LAB — URINE CULTURE

## 2011-12-04 LAB — CBC
HCT: 37.4 % — ABNORMAL LOW (ref 39.0–52.0)
MCHC: 33.7 g/dL (ref 30.0–36.0)
Platelets: 92 10*3/uL — ABNORMAL LOW (ref 150–400)
RDW: 14.3 % (ref 11.5–15.5)

## 2011-12-04 LAB — HEPARIN LEVEL (UNFRACTIONATED): Heparin Unfractionated: 1.56 IU/mL — ABNORMAL HIGH (ref 0.30–0.70)

## 2011-12-04 MED ORDER — VALACYCLOVIR HCL 500 MG PO TABS
500.0000 mg | ORAL_TABLET | Freq: Every day | ORAL | Status: DC
  Administered 2011-12-05: 500 mg via ORAL
  Filled 2011-12-04 (×2): qty 1

## 2011-12-04 MED ORDER — HEPARIN (PORCINE) IN NACL 100-0.45 UNIT/ML-% IJ SOLN
1000.0000 [IU]/h | INTRAMUSCULAR | Status: DC
  Administered 2011-12-05: 1000 [IU]/h via INTRAVENOUS
  Filled 2011-12-04 (×2): qty 250

## 2011-12-04 NOTE — Progress Notes (Signed)
[facility] PROGRESS NOTE  Jason [name] UJW:[id number] DOB: 1919-10-14 DOA: 12/02/2011 PCP: Leo Grosser, MD  Assessment/Plan: Active Problems:  Herpes zoster  Cellulitis, leg  Thrombocytopenia  CAD (coronary artery disease)  CKD (chronic kidney disease), stage III    Herpes zoster dermatitis  -Extends from his L5 area to distal third of the left forehead. It does not cross the midline  -Start Valtrex, adjusted for his renal function  -Certainly, I cannot rule out a secondary bacterial infection, cellulitis  -Blood cultures x2 sets, start vancomycin.  Acute renal failure, on CKD stage II- III -Patient baseline creatinine is about 1.1, which correlate with GFR of 64 -Patient presented to the hospital with creatinine of 1.3, aggressive IV fluid hydration was started. -Creatinine increased today to 4.3, patient is still makes urine. -CT scan without IV contrast, he is on vancomycin, no other nephrotoxic medications. -I will call for nephrology consultation.  Abdominal pain/diarrhea  -Concerned about diverticulitis  -CT abdomen and pelvis shows no acute findings.   Coronary artery disease  -History of bare-metal stent, continue aspirin.  History of recurrent DVT, legs  -Patient is Xarelto, his renal function is deteriorating. I'll hold that. -Will start him on heparin.   Thrombocytopenia  -Check fibrinogen, FDP. -No active signs of bleeding at this time   Code Status:  Full code Family Communication:  Disposition Plan:    Brief narrative: 78 year old male with past medical history of hypertension and diabetes mellitus came in to the hospital with a fever of 101.6. He does have unilateral rash consistent with shingles, opportunistic infection cannot be ruled out.  Consultants:  None  Procedures:  None  Antibiotics:  Vancomycin started on 12/02/2011  HPI/Subjective: Have some fever and chills, does not feel good.   Objective: Filed Vitals:   12/03/11 1300 12/03/11 2147 12/04/11 0001 12/04/11 0551  BP: 132/65 166/75 150/83 160/71  Pulse: 63 63 70 63  Temp: 99.2 F (37.3 C) 100.3 F (37.9 C) 99.1 F (37.3 C) 98 F (36.7 C)  TempSrc:  Oral Oral Oral  Resp: 20 20 18 18   Height:      Weight:      SpO2: 91% 98% 94% 93%    Intake/Output Summary (Last 24 hours) at 12/04/11 1039 Last data filed at 12/03/11 1846  Gross per 24 hour  Intake 2485.8 ml  Output      1 ml  Net 2484.8 ml   Filed Weights   12/02/11 1547 12/02/11 2216  Weight: 81.647 kg (180 lb) 84.1 kg (185 lb 6.5 oz)    Exam:  General: Alert and awake, oriented x3, not in any acute distress. HEENT: anicteric sclera, pupils reactive to light and accommodation, EOMI CVS: S1-S2 clear, no murmur rubs or gallops Chest: clear to auscultation bilaterally, no wheezing, rales or rhonchi Abdomen: soft nontender, nondistended, normal bowel sounds, no organomegaly Extremities: no cyanosis, clubbing or edema noted bilaterally Neuro: Cranial nerves II-XII intact, no focal neurological deficits  Data Reviewed: Basic Metabolic Panel:  Lab 12/04/11 5621 12/03/11 0750 12/02/11 1633  NA 132* 132* 133*  K 4.6 3.6 3.6  CL 102 100 100  CO2 18* 21 24  GLUCOSE 90 93 108*  BUN 32* 18 16  CREATININE 4.36* 1.94* 1.30  CALCIUM 8.0* 7.9* 8.4  MG -- -- --  PHOS -- -- --   Liver Function Tests:  Lab 12/02/11 1633  AST 23  ALT 16  ALKPHOS 73  BILITOT 0.4  PROT 6.5  ALBUMIN 3.1*  No results found for this basename: LIPASE:5,AMYLASE:5 in the last 168 hours No results found for this basename: AMMONIA:5 in the last 168 hours CBC:  Lab 12/04/11 0556 12/03/11 0750 12/02/11 1633  WBC 6.3 5.8 3.9*  NEUTROABS -- -- 2.3  HGB 12.6* 13.1 13.8  HCT 37.4* 37.8* 40.0  MCV 95.7 94.7 95.9  PLT 92* 97* 104*   Cardiac Enzymes:  Lab 12/02/11 2019  CKTOTAL 89  CKMB --  CKMBINDEX --  TROPONINI --   BNP (last 3 results) No results found for this basename: PROBNP:3 in the  last 8760 hours CBG: No results found for this basename: GLUCAP:5 in the last 168 hours  Recent Results (from the past 240 hour(s))  CULTURE, BLOOD (ROUTINE X 2)     Status: Normal (Preliminary result)   Collection Time   12/02/11  4:25 PM      Component Value Range Status Comment   Specimen Description BLOOD LEFT HAND   Final    Special Requests BOTTLES DRAWN AEROBIC ONLY 10CC   Final    Culture  Setup Time 12/03/2011 11:40   Final    Culture     Final    Value:        BLOOD CULTURE RECEIVED NO GROWTH TO DATE CULTURE WILL BE HELD FOR 5 DAYS BEFORE ISSUING A FINAL NEGATIVE REPORT   Report Status PENDING   Incomplete   CULTURE, BLOOD (ROUTINE X 2)     Status: Normal (Preliminary result)   Collection Time   12/02/11  4:32 PM      Component Value Range Status Comment   Specimen Description BLOOD LEFT HAND   Final    Special Requests BOTTLES DRAWN AEROBIC ONLY   Final    Culture  Setup Time 12/03/2011 11:40   Final    Culture     Final    Value:        BLOOD CULTURE RECEIVED NO GROWTH TO DATE CULTURE WILL BE HELD FOR 5 DAYS BEFORE ISSUING A FINAL NEGATIVE REPORT   Report Status PENDING   Incomplete      Studies: Ct Abdomen Pelvis Wo Contrast  12/03/2011  *RADIOLOGY REPORT*  Clinical Data: Abdominal pain  CT ABDOMEN AND PELVIS WITHOUT CONTRAST  Technique:  Multidetector CT imaging of the abdomen and pelvis was performed following the standard protocol without intravenous contrast.  Comparison: 04/25/2011  Findings: Probable dependent atelectasis in the bilateral lung bases.  Small hiatal hernia.  Unenhanced liver, spleen, pancreas, and adrenal glands are within normal limits.  Gallbladder is unremarkable.  No intrahepatic or extrahepatic ductal dilatation.  At least two punctate nonobstructing right renal calculi. Bilateral renal sinus cysts.  Extrarenal pelves.  No hydronephrosis.  No evidence of bowel obstruction.  Normal appendix.  Atherosclerotic calcifications of the abdominal aorta  and branch vessels.  No abdominopelvic ascites.  No suspicious abdominopelvic lymphadenopathy.  Prostate is unremarkable.  No ureteral or bladder calculi.  Small fat-containing left inguinal hernia.  Degenerative changes of the visualized thoracolumbar spine.  IMPRESSION: No evidence of bowel obstruction.  Normal appendix.  Two punctate nonobstructing right renal calculi.  No ureteral or bladder calculi.  No hydronephrosis.  No CT findings to account for the patient's abdominal pain.   Original Report Authenticated By: Charline Bills, M.D.    Dg Chest Port 1 View  12/02/2011  *RADIOLOGY REPORT*  Clinical Data: Fever, weakness, shortness of breath  PORTABLE CHEST - 1 VIEW  Comparison: 11/13/2010  Findings: Lungs are essentially clear.  No focal dilatation. No pleural effusion or pneumothorax.  The heart is normal in size.  IMPRESSION: No evidence of acute cardiopulmonary disease.   Original Report Authenticated By: Charline Bills, M.D.     Scheduled Meds:    . aspirin EC  81 mg Oral Daily  . cholecalciferol  1,000 Units Oral Daily  . DULoxetine  30 mg Oral QHS  . gabapentin  300 mg Oral BID  . hydrALAZINE  5 mg Intravenous Once  . latanoprost  1 drop Both Eyes QHS  . pantoprazole  40 mg Oral Q1200  . potassium chloride  40 mEq Oral Once  . Tamsulosin HCl  0.4 mg Oral Daily  . valACYclovir  1,000 mg Oral Daily  . DISCONTD: rivaroxaban  5 mg Oral QPM  . DISCONTD: vancomycin  750 mg Intravenous Q24H   Continuous Infusions:    . sodium chloride 125 mL/hr at 12/03/11 1923  . heparin 1,000 Units/hr (12/04/11 1023)    Active Problems:  Herpes zoster  Cellulitis, leg  Thrombocytopenia  CAD (coronary artery disease)  CKD (chronic kidney disease), stage III    Time spent: 40 minutes    [facility] A  [facility] Pager [phone]. If 8PM-8AM, please contact night-coverage at [email or web address], password [email or web address] 12/04/2011, 10:39 AM  LOS: 2 days

## 2011-12-04 NOTE — Progress Notes (Signed)
Inserted foley, 16 fr, using sterile technique. Urine returned 850 almost immediately. Patient tolerated procedure well.

## 2011-12-04 NOTE — Progress Notes (Signed)
ANTICOAGULATION CONSULT NOTE - Follow Up Consult  Pharmacy Consult for heparin Indication: hx of DVT/PE  Allergies  Allergen Reactions  . Amoxicillin-Pot Clavulanate Nausea And Vomiting    Unknown-daughter states he can tolerate Amoxicillin ok  . Fludrocortisone Acetate Other (See Comments)    unknown  . Scopace (Scopolamine) Other (See Comments)    *patch Unknown reaction    Patient Measurements: Height: 5' 1.32" (155.8 cm) Weight: 185 lb 6.5 oz (84.1 kg) IBW/kg (Calculated) : 53.04  Heparin Dosing Weight: 71.5kg  Vital Signs: Temp: 98.5 F (36.9 C) (09/23 1300) Temp src: Oral (09/23 1300) BP: 133/72 mmHg (09/23 1300) Pulse Rate: 58  (09/23 1300)  Labs:  Basename 12/04/11 1416 12/04/11 0556 12/03/11 2210 12/03/11 2157 12/03/11 0750 12/02/11 2019 12/02/11 1633  HGB -- 12.6* -- -- 13.1 -- --  HCT -- 37.4* -- -- 37.8* -- 40.0  PLT -- 92* -- -- 97* -- 104*  APTT 85* 164* 88* -- -- -- --  LABPROT -- -- -- -- -- 17.4* --  INR -- -- -- -- -- 1.47 --  HEPARINUNFRC 0.98* 1.56* -- 1.78* -- -- --  CREATININE -- 4.36* -- -- 1.94* -- 1.30  CKTOTAL -- -- -- -- -- 89 --  CKMB -- -- -- -- -- -- --  TROPONINI -- -- -- -- -- -- --    Estimated Creatinine Clearance: 10.4 ml/min (by C-G formula based on Cr of 4.36).  Assessment: 78 y.o male  admitted for possible cellulitis with history of VTE and PE Xarelto held due to renal function, for Heparin therapy.  Heparin level remains elevated.  Goal of Therapy:  APTT 66 - 102 seconds Heparin level 0.3-0.7 units/ml Monitor platelets by anticoagulation protocol: Yes   Plan:  Hold Heparin x 30 minutes, then decrease Heparin 900 units/hr Check Heparin level in 8 hours.  Talbert Cage, PharmD 12/04/2011 3:26 PM

## 2011-12-04 NOTE — Progress Notes (Signed)
ANTIBIOTIC CONSULT NOTE - FOLLOW UP  Pharmacy Consult for vancomycin Indication: cellulitis  Allergies  Allergen Reactions  . Amoxicillin-Pot Clavulanate Nausea And Vomiting    Unknown-daughter states he can tolerate Amoxicillin ok  . Fludrocortisone Acetate Other (See Comments)    unknown  . Scopace (Scopolamine) Other (See Comments)    *patch Unknown reaction    Patient Measurements: Height: 5' 1.32" (155.8 cm) Weight: 185 lb 6.5 oz (84.1 kg) IBW/kg (Calculated) : 53.04    Vital Signs: Temp: 98 F (36.7 C) (09/23 0551) Temp src: Oral (09/23 0551) BP: 160/71 mmHg (09/23 0551) Pulse Rate: 63  (09/23 0551) Intake/Output from previous day: 09/22 0701 - 09/23 0700 In: 2485.8 [P.O.:480; I.V.:1855.8; IV Piggyback:150] Out: 1 [Stool:1] Intake/Output from this shift:    Labs:  Basename 12/04/11 0556 12/03/11 0750 12/02/11 1633  WBC 6.3 5.8 3.9*  HGB 12.6* 13.1 13.8  PLT 92* 97* 104*  LABCREA -- -- --  CREATININE 4.36* 1.94* 1.30   Estimated Creatinine Clearance: 10.4 ml/min (by C-G formula based on Cr of 4.36). No results found for this basename: VANCOTROUGH:2,VANCOPEAK:2,VANCORANDOM:2,GENTTROUGH:2,GENTPEAK:2,GENTRANDOM:2,TOBRATROUGH:2,TOBRAPEAK:2,TOBRARND:2,AMIKACINPEAK:2,AMIKACINTROU:2,AMIKACIN:2, in the last 72 hours   Microbiology: Recent Results (from the past 720 hour(s))  CULTURE, BLOOD (ROUTINE X 2)     Status: Normal (Preliminary result)   Collection Time   12/02/11  4:25 PM      Component Value Range Status Comment   Specimen Description BLOOD LEFT HAND   Final    Special Requests BOTTLES DRAWN AEROBIC ONLY 10CC   Final    Culture  Setup Time 12/03/2011 11:40   Final    Culture     Final    Value:        BLOOD CULTURE RECEIVED NO GROWTH TO DATE CULTURE WILL BE HELD FOR 5 DAYS BEFORE ISSUING A FINAL NEGATIVE REPORT   Report Status PENDING   Incomplete   CULTURE, BLOOD (ROUTINE X 2)     Status: Normal (Preliminary result)   Collection Time   12/02/11  4:32  PM      Component Value Range Status Comment   Specimen Description BLOOD LEFT HAND   Final    Special Requests BOTTLES DRAWN AEROBIC ONLY   Final    Culture  Setup Time 12/03/2011 11:40   Final    Culture     Final    Value:        BLOOD CULTURE RECEIVED NO GROWTH TO DATE CULTURE WILL BE HELD FOR 5 DAYS BEFORE ISSUING A FINAL NEGATIVE REPORT   Report Status PENDING   Incomplete     Anti-infectives     Start     Dose/Rate Route Frequency Ordered Stop   12/03/11 1800   vancomycin (VANCOCIN) IVPB 1000 mg/200 mL premix  Status:  Discontinued        1,000 mg 200 mL/hr over 60 Minutes Intravenous Every 24 hours 12/02/11 2208 12/03/11 1005   12/03/11 1800   vancomycin (VANCOCIN) 750 mg in sodium chloride 0.9 % 150 mL IVPB  Status:  Discontinued        750 mg 150 mL/hr over 60 Minutes Intravenous Every 24 hours 12/03/11 1005 12/04/11 0946   12/03/11 1100   valACYclovir (VALTREX) tablet 1,000 mg        1,000 mg Oral Daily 12/03/11 1019     12/02/11 2200   valACYclovir (VALTREX) tablet 1,000 mg  Status:  Discontinued        1,000 mg Oral 2 times daily 12/02/11 2149 12/03/11 1019  12/02/11 1845   vancomycin (VANCOCIN) IVPB 1000 mg/200 mL premix        1,000 mg 200 mL/hr over 60 Minutes Intravenous  Once 12/02/11 1837 12/02/11 2008          Assessment: 78 y.o male presented to ED with generalized weakness, leg pain and fever of 102. Rx to dose vanc for possible cellulitis.  SrCr today 4.36.  UOP not recorded accurately.  Goal of Therapy:  Vancomycin trough level 10-15 mcg/ml  Plan:  1. Hold vancomycin for now 2. F/u labs 3. Consider random level in 24-48 hours  Murvin Donning Clinical Pharmacist Pager:[phone] Phone [phone] 12/04/2011 9:47 AM

## 2011-12-04 NOTE — Consult Note (Signed)
[facility] CONSULT NOTE    Date: 12/04/2011                  Patient Name:  Jason [name]  MRN: [id number]  DOB: October 05, 1919  Age / Sex: 78 y.o., male         PCP: Leo Grosser, MD                 Service Requesting Consult: Internal Medicine - Hospitalist Service                  Reason for Consult: AKI            History of Present Illness: Patient is a 78 y.o. 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 It was determined that the patient has herpes zoster of the left buttocks with a possible cellulitis of the left thigh. Therefore, the patient was placed in IV acyclovir for shingles treatment and Vancomycin for presumed cellulitis. Over the past two days, the patient has experienced oliguria and an acute rise in his creatinine to 4.36 from a baseline of 1.3 at admission. A foley catheter was placed this afternoon, which showed a retention of 850 mL of urine. Currently, the patient is accompanied by both of his children in the room. Jason [name] denies any pain or discomfort, but notes that he does not have an appetite. His daughter is worried that he is slurring his speech and appears generally worse than yesterday afternoon. Otherwise, his son is concerned that his abdomen is distended and reports numerous occassions in which the patient has had constipation that led to urinary retention.   Medications: Outpatient medications: Prescriptions prior to admission  Medication Sig Dispense Refill  . ALPRAZolam (XANAX) 0.25 MG tablet Take 0.25 mg by mouth at bedtime as needed. anxiety      . aspirin 81 MG tablet Take 81 mg by mouth daily.        . Cholecalciferol (VITAMIN D3) 1000 UNITS CAPS Take 1 capsule by mouth daily.        Tery Sanfilippo Calcium (STOOL  SOFTENER PO) Take 1 capsule by mouth at bedtime.      . DULoxetine (CYMBALTA) 30 MG capsule Take 30 mg by mouth at bedtime.      . gabapentin (NEURONTIN) 300 MG capsule Take 300 mg by mouth 2 (two) times daily.      [name] HYDROcodone-acetaminophen (NORCO) 5-325 MG per tablet Take 1 tablet by mouth every 6 (six) hours as needed. Pain       . meclizine (ANTIVERT) 25 MG tablet Take 12.5 mg by mouth as needed. dizziness      . nitroGLYCERIN (NITROSTAT) 0.4 MG SL tablet Place 0.4 mg under the tongue every 5 (five) minutes as needed. Chest pain      . omeprazole (PRILOSEC) 20 MG capsule Take 20 mg by mouth 2 (two) times daily before a meal.       . polyethylene glycol powder (MIRALAX) powder Take 17 g by mouth at bedtime. Constipation       . promethazine (PHENERGAN) 25 MG tablet Take 25 mg by mouth every 6 (six) hours as needed. nausea      . rivaroxaban (XARELTO) 10 MG TABS tablet Take 5 mg by mouth every evening.      [name]  Tamsulosin HCl (FLOMAX) 0.4 MG CAPS Take 0.4 mg by mouth daily.        [name] zolpidem (AMBIEN) 10 MG tablet Take 5 mg by mouth at bedtime as needed. sleep        Current medications: Current Facility-Administered Medications  Medication Dose Route Frequency Provider Last Rate Last Dose  . 0.9 %  sodium chloride infusion   Intravenous Continuous Clydia Llano, MD 125 mL/hr at 12/03/11 1923    . acetaminophen (TYLENOL) tablet 650 mg  650 mg Oral Q6H PRN Catarina Hartshorn, MD   650 mg at 12/03/11 2302   Or  . acetaminophen (TYLENOL) suppository 650 mg  650 mg Rectal Q6H PRN Catarina Hartshorn, MD   650 mg at 12/03/11 0446  . ALPRAZolam (XANAX) tablet 0.25 mg  0.25 mg Oral QHS PRN Catarina Hartshorn, MD      . aspirin EC tablet 81 mg  81 mg Oral Daily Catarina Hartshorn, MD   81 mg at 12/04/11 1040  . cholecalciferol (VITAMIN D) tablet 1,000 Units  1,000 Units Oral Daily Catarina Hartshorn, MD   1,000 Units at 12/04/11 1040  . DULoxetine (CYMBALTA) DR capsule 30 mg  30 mg Oral QHS Catarina Hartshorn, MD   30 mg at 12/03/11 2138  . gabapentin  (NEURONTIN) capsule 300 mg  300 mg Oral BID Catarina Hartshorn, MD   300 mg at 12/04/11 1040  . heparin ADULT infusion 100 units/mL (25000 units/250 mL)  1,000 Units/hr Intravenous Continuous Clydia Llano, MD 10 mL/hr at 12/04/11 1023 1,000 Units/hr at 12/04/11 1023  . hydrALAZINE (APRESOLINE) injection 10 mg  10 mg Intravenous Q6H PRN Rolan Lipa, NP   10 mg at 12/03/11 2302  . hydrALAZINE (APRESOLINE) injection 5 mg  5 mg Intravenous Once Rolan Lipa, NP      . HYDROcodone-acetaminophen (NORCO/VICODIN) 5-325 MG per tablet 1-2 tablet  1-2 tablet Oral Q4H PRN Catarina Hartshorn, MD      . latanoprost (XALATAN) 0.005 % ophthalmic solution 1 drop  1 drop Both Eyes QHS Clydia Llano, MD   1 drop at 12/03/11 2200  . morphine 4 MG/ML injection 4 mg  4 mg Intravenous Q4H PRN Catarina Hartshorn, MD   4 mg at 12/02/11 2219  . ondansetron (ZOFRAN) tablet 4 mg  4 mg Oral Q4H PRN Rolan Lipa, NP       Or  . ondansetron [facility]) injection 4 mg  4 mg Intravenous Q4H PRN Rolan Lipa, NP   4 mg at 12/04/11 1036  . pantoprazole (PROTONIX) EC tablet 40 mg  40 mg Oral Q1200 Catarina Hartshorn, MD   40 mg at 12/03/11 1207  . Tamsulosin HCl (FLOMAX) capsule 0.4 mg  0.4 mg Oral Daily Catarina Hartshorn, MD   0.4 mg at 12/04/11 1040  . valACYclovir (VALTREX) tablet 1,000 mg  1,000 mg Oral Daily Clydia Llano, MD   1,000 mg at 12/04/11 1040  . DISCONTD: vancomycin (VANCOCIN) 750 mg in sodium chloride 0.9 % 150 mL IVPB  750 mg Intravenous Q24H Bolanle A Lawuyi, PHARMD   750 mg at 12/03/11 1742      Allergies: Allergies  Allergen Reactions  . Amoxicillin-Pot Clavulanate Nausea And Vomiting    Unknown-daughter states he can tolerate Amoxicillin ok  . Fludrocortisone Acetate Other (See Comments)    unknown  . Scopace (Scopolamine) Other (See Comments)    *patch Unknown reaction      Past Medical History: Past Medical History  Diagnosis Date  . PVD (peripheral  vascular disease)   . CAD (coronary artery  disease)   . Hyperlipidemia   . Cerebrovascular accident   . DVT femoral (deep venous thrombosis) with thrombophlebitis   . GERD (gastroesophageal reflux disease)   . Edema   . Anxiety   . Vertigo   . IBS (irritable bowel syndrome)   . Duodenitis   . Gastritis   . Esophageal stricture   . Cellulitis   . Pulmonary embolism   . Prostatic hypertrophy   . Hiatal hernia   . Frequent falls   . Subdural hematoma   . PTSD (post-traumatic stress disorder)   . Vertigo      Past Surgical History: Past Surgical History  Procedure Date  . Hand surgery   . Angioplasty   . Carotid endarterectomy     Left  . Bare-metal stenting     left circumflex  . Bilateral cataract surgery      Family History: Family History  Problem Relation Age of Onset  . Colon cancer Neg Hx   . Coronary artery disease Brother   . Heart attack Father      Social History: History   Social History  . Marital Status: Married    Spouse Name: N/A    Number of Children: N/A  . Years of Education: N/A   Occupational History  . Not on file.   Social History Main Topics  . Smoking status: Never Smoker   . Smokeless tobacco: Not on file  . Alcohol Use: Yes     Wine occasional  . Drug Use: No  . Sexually Active: Not on file   Other Topics Concern  . 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 rate 18, height 5' 1.32" (1.558 m), weight 185 lb 6.5 oz (84.1 kg), SpO2 91.00%.  Weight trends: Filed Weights   12/02/11 1547 12/02/11 2216  Weight: 180 lb (81.647 kg) 185 lb 6.5 oz (84.1 kg)    Physical Exam: General: Vital signs reviewed and noted. Well-developed, well-nourished, in no acute distress; alert but disoriented to situation  Head: Normocephalic, atraumatic.  Eyes: PERRL, EOMI, No signs  of anemia or jaundince.  Nose: Mucous membranes moist, not inflammed, nonerythematous.  Throat: Oropharynx nonerythematous, no exudate appreciated.   Neck: No deformities, masses, or tenderness noted.Supple, No carotid Bruits, no JVD.  Lungs:  Normal respiratory effort. Clear to auscultation BL without crackles or wheezes.  Heart: RRR. S1 and S2 normal without gallop, murmur, or rubs.  Abdomen:  BS normoactive. Soft, Nondistended, non-tender.  No masses or organomegaly.  Extremities: No pretibial edema.  Genital: Herpetic lesion on scrotum, prostate uniformly enlarged without nodularity   Neurologic: A&O X3, CN II - XII are grossly intact. Motor strength is 5/5 in the all 4 extremities, Sensations intact to light touch, Cerebellar signs negative.  Skin: No visible rashes, scars.    Lab results: Basic Metabolic Panel:  Lab 12/04/11 1191 12/03/11 0750 12/02/11 1633  NA 132* 132* 133*  K 4.6 3.6 3.6  CL 102 100 100  CO2 18* 21 24  GLUCOSE 90 93 108*  BUN 32* 18 16  CREATININE 4.36* 1.94* 1.30  CALCIUM 8.0* 7.9* 8.4  MG -- -- --  PHOS -- -- --    Liver Function Tests:  Lab 12/02/11 1633  AST 23  ALT 16  ALKPHOS 73  BILITOT 0.4  PROT 6.5  ALBUMIN 3.1*   No results found for this basename: LIPASE:3,AMYLASE:3 in the last 168 hours No results found for this basename: AMMONIA:3 in the last 168 hours  CBC:  Lab 12/04/11 0556 12/03/11 0750 12/02/11 1633  WBC 6.3 5.8 3.9*  NEUTROABS -- -- 2.3  HGB 12.6* 13.1 13.8  HCT 37.4* 37.8* 40.0  MCV 95.7 94.7 95.9  PLT 92* 97* 104*    Cardiac Enzymes:  Lab 12/02/11 2019  CKTOTAL 89  CKMB --  CKMBINDEX --  TROPONINI --    BNP: No components found with this basename: POCBNP:3  CBG: No results found for this basename: GLUCAP:5 in the last 168 hours  Microbiology: Results for orders placed during the hospital encounter of 12/02/11  CULTURE, BLOOD (ROUTINE X 2)     Status: Normal (Preliminary result)   Collection Time    12/02/11  4:25 PM      Component Value Range Status Comment   Specimen Description BLOOD LEFT HAND   Final    Special Requests BOTTLES DRAWN AEROBIC ONLY 10CC   Final    Culture  Setup Time 12/03/2011 11:40   Final    Culture     Final    Value:        BLOOD CULTURE RECEIVED NO GROWTH TO DATE CULTURE WILL BE HELD FOR 5 DAYS BEFORE ISSUING A FINAL NEGATIVE REPORT   Report Status PENDING   Incomplete   CULTURE, BLOOD (ROUTINE X 2)     Status: Normal (Preliminary result)   Collection Time   12/02/11  4:32 PM      Component Value Range Status Comment   Specimen Description BLOOD LEFT HAND   Final    Special Requests BOTTLES DRAWN AEROBIC ONLY   Final    Culture  Setup Time 12/03/2011 11:40   Final    Culture     Final    Value:        BLOOD CULTURE RECEIVED NO GROWTH TO DATE CULTURE WILL BE HELD FOR 5 DAYS BEFORE ISSUING A FINAL NEGATIVE REPORT   Report Status PENDING   Incomplete   URINE CULTURE     Status: Normal   Collection Time   12/02/11  5:19 PM      Component Value Range Status Comment   Specimen Description URINE, CLEAN CATCH   Final    Special Requests NONE   Final    Culture  Setup Time 12/03/2011 11:39   Final    Colony Count 85,000 COLONIES/ML   Final    Culture     Final    Value: Multiple bacterial morphotypes present, none predominant. Suggest appropriate recollection if clinically indicated.   Report Status 12/04/2011 FINAL   Final     Coagulation Studies:  Basename 12/02/11 2019  LABPROT 17.4*  INR 1.47    Urinalysis:  Basename 12/02/11 1718  COLORURINE YELLOW  LABSPEC 1.024  PHURINE 7.0  GLUCOSEU NEGATIVE  HGBUR LARGE*  BILIRUBINUR NEGATIVE  KETONESUR NEGATIVE  PROTEINUR 30*  UROBILINOGEN 1.0  NITRITE NEGATIVE  LEUKOCYTESUR NEGATIVE      Imaging: Ct Abdomen Pelvis Wo Contrast  12/03/2011  *RADIOLOGY REPORT*  Clinical Data: Abdominal pain  CT ABDOMEN AND PELVIS WITHOUT CONTRAST  Technique:  Multidetector CT imaging of the abdomen and pelvis was  performed following the standard protocol without intravenous contrast.  Comparison: 04/25/2011  Findings: Probable dependent atelectasis in the bilateral lung  bases.  Small hiatal hernia.  Unenhanced liver, spleen, pancreas, and adrenal glands are within normal limits.  Gallbladder is unremarkable.  No intrahepatic or extrahepatic ductal dilatation.  At least two punctate nonobstructing right renal calculi. Bilateral renal sinus cysts.  Extrarenal pelves.  No hydronephrosis.  No evidence of bowel obstruction.  Normal appendix.  Atherosclerotic calcifications of the abdominal aorta and branch vessels.  No abdominopelvic ascites.  No suspicious abdominopelvic lymphadenopathy.  Prostate is unremarkable.  No ureteral or bladder calculi.  Small fat-containing left inguinal hernia.  Degenerative changes of the visualized thoracolumbar spine.  IMPRESSION: No evidence of bowel obstruction.  Normal appendix.  Two punctate nonobstructing right renal calculi.  No ureteral or bladder calculi.  No hydronephrosis.  No CT findings to account for the patient's abdominal pain.   Original Report Authenticated By: Charline Bills, M.D.    US Renal  12/04/2011  *RADIOLOGY REPORT*  Clinical Data: Renal failure  RENAL/URINARY TRACT ULTRASOUND COMPLETE  Comparison:  CT abdomen pelvis dated 12/02/2011 and 04/25/2011  Findings:  Right Kidney:  Measures 11.1 cm.  Mild fullness of the renal collecting system with an extrarenal pelvis, unchanged from prior CTs.  Left Kidney:  Measures 11.2 cm.  Mild fullness of the renal collecting system with an extrarenal pelvis, unchanged from prior CTs.  Bladder:  Distended.  IMPRESSION: Mild fullness of the bilateral renal collecting systems with extrarenal pelves, unchanged from prior CTs.  Distended bladder.   Original Report Authenticated By: Charline Bills, M.D.    Dg Chest Port 1 View  12/02/2011  *RADIOLOGY REPORT*  Clinical Data: Fever, weakness, shortness of breath  PORTABLE CHEST - 1  VIEW  Comparison: 11/13/2010  Findings: Lungs are essentially clear.  No focal dilatation. No pleural effusion or pneumothorax.  The heart is normal in size.  IMPRESSION: No evidence of acute cardiopulmonary disease.   Original Report Authenticated By: Charline Bills, M.D.       Assessment & Plan: Patient is a 78 y.o. 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    1. Acute Kidney Injury: The sudden rise in the patient's creatinine to 4.3 from 1.3 in less than 48 hours is likely secondary to post-renal obstruction from prostatic hyperplasia. The urinary rentention caused bladder distention and distention of the renal calyxes evident on ultrasound from 9/23. Fortunately, the patient has a foley now, which produced 850 mL of urine with insertion. Another less likely cause could be acute tubular obstruction from deposition of valacyclovir crystals. There are not other offending agents on his medication list currently. If this is truly post-renal, then we should expect improvement now that the patient has a catheter.  - Decrease valacyclovir to 500 mg IV daily since GFR decreased < 15  - Continue tamsulosin and Foley catheter - strict I/O   - daily labs  2. Altered Mental Status: The patient has intermittent confusion and speech that his children note is different from baseline. The etiology of this could be multifactorial likely including from urinary rentention, medication, and delirium. A less likely causes like intra-cerebral bleed, but this must be considered as the patient has been on Xarelto and has a history of subdural hematoma.  - D/C neurontin as this is likely to increase concentration in patient  with kidney injury  and can cause AMS - Maintain foley as this main also help  Thank you for this interesting consultation. We look forward to following the patient.   Si Raider Clinton Sawyer, MD, PGY-2 I have seen and examined this patient and agree with the plan of care  Seen and eval and family counseled.  ARF from BOO, ? Prostate.  R/O crystalline Nx from Valcyte.  Confusion from gabapentin.  If Cr improves, cont Valcyte. .  Luree Palla L 12/04/2011, 5:14 PM

## 2011-12-04 NOTE — Progress Notes (Addendum)
ANTICOAGULATION CONSULT NOTE - Follow Up Consult  Pharmacy Consult for heparin Indication: hx of DVT/PE  Allergies  Allergen Reactions  . Amoxicillin-Pot Clavulanate Nausea And Vomiting    Unknown-daughter states he can tolerate Amoxicillin ok  . Fludrocortisone Acetate Other (See Comments)    unknown  . Scopace (Scopolamine) Other (See Comments)    *patch Unknown reaction    Patient Measurements: Height: 5' 1.32" (155.8 cm) Weight: 185 lb 6.5 oz (84.1 kg) IBW/kg (Calculated) : 53.04  Heparin Dosing Weight: 71.5kg  Vital Signs: Temp: 98 F (36.7 C) (09/23 0551) Temp src: Oral (09/23 0551) BP: 160/71 mmHg (09/23 0551) Pulse Rate: 63  (09/23 0551)  Labs:  Alvira Philips 12/04/11 0556 12/03/11 2210 12/03/11 2157 12/03/11 0750 12/02/11 2019 12/02/11 1633  HGB 12.6* -- -- 13.1 -- --  HCT 37.4* -- -- 37.8* -- 40.0  PLT 92* -- -- 97* -- 104*  APTT 164* 88* -- -- 41* --  LABPROT -- -- -- -- 17.4* --  INR -- -- -- -- 1.47 --  HEPARINUNFRC 1.56* -- 1.78* -- -- --  CREATININE -- -- -- 1.94* -- 1.30  CKTOTAL -- -- -- -- 89 --  CKMB -- -- -- -- -- --  TROPONINI -- -- -- -- -- --    Estimated Creatinine Clearance: 23.4 ml/min (by C-G formula based on Cr of 1.94).  Assessment: 78 y.o male  admitted for possible cellulitis with history of VTE and PE Xarelto held due to renal function, for Heparin   Goal of Therapy:  APTT 66 - 102 seconds Heparin level 0.3-0.7 units/ml Monitor platelets by anticoagulation protocol: Yes   Plan:  Hold Heparin x 30 minutes, then decrease Heparin 1000 units/hr Check PTT in 8 hours.  Geannie Risen, PharmD, BCPS  12/04/2011 7:28 AM

## 2011-12-05 DIAGNOSIS — R42 Dizziness and giddiness: Secondary | ICD-10-CM

## 2011-12-05 DIAGNOSIS — N179 Acute kidney failure, unspecified: Secondary | ICD-10-CM

## 2011-12-05 LAB — CBC
HCT: 38.5 % — ABNORMAL LOW (ref 39.0–52.0)
Hemoglobin: 13.4 g/dL (ref 13.0–17.0)
WBC: 7.6 10*3/uL (ref 4.0–10.5)

## 2011-12-05 LAB — BASIC METABOLIC PANEL
BUN: 21 mg/dL (ref 6–23)
Chloride: 104 mEq/L (ref 96–112)
Glucose, Bld: 87 mg/dL (ref 70–99)
Potassium: 4 mEq/L (ref 3.5–5.1)

## 2011-12-05 LAB — VANCOMYCIN, TROUGH: Vancomycin Tr: 5.1 ug/mL — ABNORMAL LOW (ref 10.0–20.0)

## 2011-12-05 LAB — APTT: aPTT: 55 seconds — ABNORMAL HIGH (ref 24–37)

## 2011-12-05 MED ORDER — VANCOMYCIN HCL 1000 MG IV SOLR
750.0000 mg | INTRAVENOUS | Status: DC
  Administered 2011-12-05: 750 mg via INTRAVENOUS
  Filled 2011-12-05 (×2): qty 750

## 2011-12-05 MED ORDER — HEPARIN (PORCINE) IN NACL 100-0.45 UNIT/ML-% IJ SOLN
1050.0000 [IU]/h | INTRAMUSCULAR | Status: DC
  Administered 2011-12-05 – 2011-12-06 (×2): 1050 [IU]/h via INTRAVENOUS
  Filled 2011-12-05 (×3): qty 250

## 2011-12-05 NOTE — Progress Notes (Signed)
Notified Craige Cotta, NP that patient's son states that patient is halluncating and is agitated. Son states that his sister called and stated that these are the side effects of Heparin.  NP stated it was okay for me to give patient Xanax for sleep. No new orders given. Will continue to monitor patient. Nelda Marseille, RN

## 2011-12-05 NOTE — Progress Notes (Signed)
[facility] Progress Note    Subjective:   Patient with confusion overnight and often trying to get out of bed. It is bothersome to the son, but the patient does not appear agitated this morning.    Objective:   BP 158/74  Pulse 68  Temp 98.6 F (37 C) (Axillary)  Resp 18  Ht 5' 1.32" (1.558 m)  Wt 190 lb 11.2 oz (86.5 kg)  BMI 35.66 kg/m2  SpO2 92%  Intake/Output Summary (Last 24 hours) at 12/05/11 1114 Last data filed at 12/05/11 1005  Gross per 24 hour  Intake 1186.4 ml  Output   3850 ml  Net -2663.6 ml   Weight change:   Physical Exam: General:  Vital signs reviewed and noted. Well-developed, well-nourished, in no acute distress; alert but disoriented to situation   Head:  Normocephalic, atraumatic.   Eyes:  PERRL, EOMI, No signs of anemia or jaundince.   Nose:  Mucous membranes moist, not inflammed, nonerythematous.   Throat:  Oropharynx nonerythematous, no exudate appreciated.   Neck:  No deformities, masses, or tenderness noted.Supple, No carotid Bruits, no JVD.   Lungs:  Normal respiratory effort. Clear to auscultation BL without crackles or wheezes.   Heart:  RRR. S1 and S2 normal without gallop, murmur, or rubs.   Abdomen:  BS normoactive. Soft, Nondistended, non-tender. No masses or organomegaly.   Extremities:  No pretibial edema.   Genital:  Herpetic lesion on scrotum, prostate uniformly enlarged without nodularity   Neurologic:  Alert but not oriented to situation   Skin:  No visible rashes, scars.      Imaging: US Renal  12/04/2011  *RADIOLOGY REPORT*  Clinical Data: Renal failure  RENAL/URINARY TRACT ULTRASOUND COMPLETE  Comparison:  CT abdomen pelvis dated 12/02/2011 and 04/25/2011  Findings:  Right Kidney:  Measures 11.1 cm.  Mild fullness of the renal collecting system with an extrarenal pelvis, unchanged from prior CTs.  Left Kidney:  Measures 11.2 cm.  Mild fullness of the renal collecting system with an extrarenal pelvis, unchanged  from prior CTs.  Bladder:  Distended.  IMPRESSION: Mild fullness of the bilateral renal collecting systems with extrarenal pelves, unchanged from prior CTs.  Distended bladder.   Original Report Authenticated By: Charline Bills, M.D.     Labs: BMET  Lab 12/05/11 0615 12/04/11 0556 12/03/11 0750 12/02/11 1633  NA 137 132* 132* 133*  K 4.0 4.6 3.6 3.6  CL 104 102 100 100  CO2 20 18* 21 24  GLUCOSE 87 90 93 108*  BUN 21 32* 18 16  CREATININE 2.03* 4.36* 1.94* 1.30  ALB -- -- -- --  CALCIUM 8.7 8.0* 7.9* 8.4  PHOS -- -- -- --   CBC  Lab 12/05/11 0615 12/04/11 0556 12/03/11 0750 12/02/11 1633  WBC 7.6 6.3 5.8 3.9*  NEUTROABS -- -- -- 2.3  HGB 13.4 12.6* 13.1 13.8  HCT 38.5* 37.4* 37.8* 40.0  MCV 93.7 95.7 94.7 95.9  PLT 99* 92* 97* 104*    Medications:      . aspirin EC  81 mg Oral Daily  . cholecalciferol  1,000 Units Oral Daily  . DULoxetine  30 mg Oral QHS  . hydrALAZINE  5 mg Intravenous Once  . latanoprost  1 drop Both Eyes QHS  . pantoprazole  40 mg Oral Q1200  . Tamsulosin HCl  0.4 mg Oral Daily  . valACYclovir  500 mg Oral Daily  . DISCONTD: gabapentin  300 mg Oral BID  . DISCONTD: valACYclovir  1,000 mg Oral Daily     Assessment/ Plan:   Patient is a 78 y.o. 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   1. Acute Kidney Injury: The sudden rise in the patient's creatinine to 4.3 from 1.3 in less than 48 hours is likely secondary to post-renal obstruction from prostatic hyperplasia. The urinary rentention caused bladder distention and distention of the renal calyxes evident on ultrasound from 9/23. Fortunately, the patient has a foley now, which produced 850 mL of urine with insertion.   -  This was clearly obstructive as the patient had a precipitous drop in his creatinine with great UOP after insertion of the foley - Continue low dose Valacyclovir until GFR back to baseline  - We will sign off since he has improved    2. Altered Mental Status: The patient has intermittent confusion and speech that his children note is different from baseline. The etiology of this could be multifactorial likely including from urinary rentention, medication, and delirium. A less likely causes like intra-cerebral bleed, but this must be considered as the patient has been on Xarelto and has a history of subdural hematoma.  - D/C neurontin as this is likely to increase concentration in patient with kidney injury and can cause AMS  - Maintain foley as this main also help    Thank you for this interesting consultation.   Si Raider Clinton Sawyer, MD, PGY-2 12/05/2011, 11:14 AM  I have seen and examined this patient and agree with the plan of care  Seen and eval and discussed with residents and family.  Will s/o at this time. .  Ordell Prichett L 12/05/2011, 12:25 PM

## 2011-12-05 NOTE — Progress Notes (Signed)
ANTICOAGULATION CONSULT NOTE - Follow Up Consult  Pharmacy Consult for heparin Indication: hx of DVT/PE  Allergies  Allergen Reactions  . Amoxicillin-Pot Clavulanate Nausea And Vomiting    Unknown-daughter states he can tolerate Amoxicillin ok  . Fludrocortisone Acetate Other (See Comments)    unknown  . Scopace (Scopolamine) Other (See Comments)    *patch Unknown reaction    Patient Measurements: Height: 5' 1.32" (155.8 cm) Weight: 190 lb 11.2 oz (86.5 kg) IBW/kg (Calculated) : 53.04  Heparin Dosing Weight: 71.5kg  Vital Signs: Temp: 98.6 F (37 C) (09/24 0459) Temp src: Axillary (09/24 0459) BP: 158/74 mmHg (09/24 0509) Pulse Rate: 68  (09/24 0509)  Labs:  Alvira Philips 12/05/11 0615 12/04/11 1416 12/04/11 0556 12/03/11 2157 12/03/11 0750 12/02/11 2019  HGB 13.4 -- 12.6* -- -- --  HCT 38.5* -- 37.4* -- 37.8* --  PLT 99* -- 92* -- 97* --  APTT 55* 85* 164* -- -- --  LABPROT -- -- -- -- -- 17.4*  INR -- -- -- -- -- 1.47  HEPARINUNFRC -- 0.98* 1.56* 1.78* -- --  CREATININE 2.03* -- 4.36* -- 1.94* --  CKTOTAL -- -- -- -- -- 89  CKMB -- -- -- -- -- --  TROPONINI -- -- -- -- -- --    Estimated Creatinine Clearance: 22.7 ml/min (by C-G formula based on Cr of 2.03).  Assessment: 78 y.o male  admitted for possible cellulitis with history of VTE and PE Xarelto held due to renal function, for Heparin   Goal of Therapy:  APTT 66 - 102 seconds Heparin level 0.3-0.7 units/ml Monitor platelets by anticoagulation protocol: Yes   Plan:  Increase Heparin 1000 units/hr Check PTT and heparin level in 6 hours  Geannie Risen, PharmD, BCPS . 12/05/2011 7:58 AM

## 2011-12-05 NOTE — Progress Notes (Signed)
[facility] PROGRESS NOTE  Jason [name] ZOX:[id number] DOB: June 27, 1919 DOA: 12/02/2011 PCP: Leo Grosser, MD  Assessment/Plan: Active Problems:  Herpes zoster  Cellulitis, leg  Thrombocytopenia  CAD (coronary artery disease)  CKD (chronic kidney disease), stage III    Herpes zoster dermatitis  -Extends from his L5 area to distal third of the left forehead. It does not cross the midline  -Start Valtrex, adjusted for his renal function  -Patient does have secondary bacterial infection, cellulitis involving his buttocks -Blood cultures x2 sets, start vancomycin.  Left buttock cellulitis -Secondary bacterial infection, patient is on vancomycin. -Improved very well, redness and warmth. As well as the shingles vesicles.   Acute renal failure, on CKD stage II- III -Patient baseline creatinine is about 1.1, which correlate with GFR of 64 -Patient presented to the hospital with creatinine of 1.3, aggressive IV fluid hydration was started. -Creatinine increased today to 4.3, appreciate urology's help. -Appears to be post obstructive uropathy, Foley catheter placed in, likely he will be discharged with a catheter till he sees his urologist as outpatient. -Continue IV fluids, follow electrolytes closely for the post obstructive diuresis.   Abdominal pain/diarrhea  -Concerned about diverticulitis , this resolved now  -CT abdomen and pelvis shows no acute findings.   Coronary artery disease  -History of bare-metal stent, continue aspirin.  History of recurrent DVT, legs  -Patient is Xarelto, his renal function is deteriorating. I'll hold that. -Will start him on heparin drip.we will switch back to [place] on his GFR improves.    Thrombocytopenia  -follow closely, fibrinogen is 455, he is on heparin drip.  -No active signs of bleeding at this time   Code Status:  Full code Family Communication:  Disposition Plan:    Brief narrative: 78 year old male with past medical  history of hypertension and diabetes mellitus came in to the hospital with a fever of 101.6. He does have unilateral rash consistent with shingles, opportunistic infection cannot be ruled out.  Consultants:  None  Procedures:  None  Antibiotics:  Vancomycin started on 12/02/2011  HPI/Subjective: Have some fever and chills, does not feel good.   Objective: Filed Vitals:   12/04/11 2310 12/05/11 0234 12/05/11 0459 12/05/11 0509  BP: 173/80 151/69 172/82 158/74  Pulse: 70 71 72 68  Temp: 98.6 F (37 C)  98.6 F (37 C)   TempSrc: Oral  Axillary   Resp: 19  18   Height:      Weight:   86.5 kg (190 lb 11.2 oz)   SpO2: 94%  92%     Intake/Output Summary (Last 24 hours) at 12/05/11 1203 Last data filed at 12/05/11 1144  Gross per 24 hour  Intake 1186.4 ml  Output   4850 ml  Net -3663.6 ml   Filed Weights   12/02/11 1547 12/02/11 2216 12/05/11 0459  Weight: 81.647 kg (180 lb) 84.1 kg (185 lb 6.5 oz) 86.5 kg (190 lb 11.2 oz)    Exam:  General: Alert and awake, oriented x3, not in any acute distress. HEENT: anicteric sclera, pupils reactive to light and accommodation, EOMI CVS: S1-S2 clear, no murmur rubs or gallops Chest: clear to auscultation bilaterally, no wheezing, rales or rhonchi Abdomen: soft nontender, nondistended, normal bowel sounds, no organomegaly Extremities: no cyanosis, clubbing or edema noted bilaterally Neuro: Cranial nerves II-XII intact, no focal neurological deficits  Data Reviewed: Basic Metabolic Panel:  Lab 12/05/11 8119 12/04/11 0556 12/03/11 0750 12/02/11 1633  NA 137 132* 132* 133*  K 4.0 4.6  3.6 3.6  CL 104 102 100 100  CO2 20 18* 21 24  GLUCOSE 87 90 93 108*  BUN 21 32* 18 16  CREATININE 2.03* 4.36* 1.94* 1.30  CALCIUM 8.7 8.0* 7.9* 8.4  MG -- -- -- --  PHOS -- -- -- --   Liver Function Tests:  Lab 12/02/11 1633  AST 23  ALT 16  ALKPHOS 73  BILITOT 0.4  PROT 6.5  ALBUMIN 3.1*   No results found for this basename:  LIPASE:5,AMYLASE:5 in the last 168 hours No results found for this basename: AMMONIA:5 in the last 168 hours CBC:  Lab 12/05/11 0615 12/04/11 0556 12/03/11 0750 12/02/11 1633  WBC 7.6 6.3 5.8 3.9*  NEUTROABS -- -- -- 2.3  HGB 13.4 12.6* 13.1 13.8  HCT 38.5* 37.4* 37.8* 40.0  MCV 93.7 95.7 94.7 95.9  PLT 99* 92* 97* 104*   Cardiac Enzymes:  Lab 12/02/11 2019  CKTOTAL 89  CKMB --  CKMBINDEX --  TROPONINI --   BNP (last 3 results) No results found for this basename: PROBNP:3 in the last 8760 hours CBG: No results found for this basename: GLUCAP:5 in the last 168 hours  Recent Results (from the past 240 hour(s))  CULTURE, BLOOD (ROUTINE X 2)     Status: Normal (Preliminary result)   Collection Time   12/02/11  4:25 PM      Component Value Range Status Comment   Specimen Description BLOOD LEFT HAND   Final    Special Requests BOTTLES DRAWN AEROBIC ONLY 10CC   Final    Culture  Setup Time 12/03/2011 11:40   Final    Culture     Final    Value:        BLOOD CULTURE RECEIVED NO GROWTH TO DATE CULTURE WILL BE HELD FOR 5 DAYS BEFORE ISSUING A FINAL NEGATIVE REPORT   Report Status PENDING   Incomplete   CULTURE, BLOOD (ROUTINE X 2)     Status: Normal (Preliminary result)   Collection Time   12/02/11  4:32 PM      Component Value Range Status Comment   Specimen Description BLOOD LEFT HAND   Final    Special Requests BOTTLES DRAWN AEROBIC ONLY   Final    Culture  Setup Time 12/03/2011 11:40   Final    Culture     Final    Value:        BLOOD CULTURE RECEIVED NO GROWTH TO DATE CULTURE WILL BE HELD FOR 5 DAYS BEFORE ISSUING A FINAL NEGATIVE REPORT   Report Status PENDING   Incomplete   URINE CULTURE     Status: Normal   Collection Time   12/02/11  5:19 PM      Component Value Range Status Comment   Specimen Description URINE, CLEAN CATCH   Final    Special Requests NONE   Final    Culture  Setup Time 12/03/2011 11:39   Final    Colony Count 85,000 COLONIES/ML   Final     Culture     Final    Value: Multiple bacterial morphotypes present, none predominant. Suggest appropriate recollection if clinically indicated.   Report Status 12/04/2011 FINAL   Final      Studies: US Renal  12/04/2011  *RADIOLOGY REPORT*  Clinical Data: Renal failure  RENAL/URINARY TRACT ULTRASOUND COMPLETE  Comparison:  CT abdomen pelvis dated 12/02/2011 and 04/25/2011  Findings:  Right Kidney:  Measures 11.1 cm.  Mild fullness of the renal collecting  system with an extrarenal pelvis, unchanged from prior CTs.  Left Kidney:  Measures 11.2 cm.  Mild fullness of the renal collecting system with an extrarenal pelvis, unchanged from prior CTs.  Bladder:  Distended.  IMPRESSION: Mild fullness of the bilateral renal collecting systems with extrarenal pelves, unchanged from prior CTs.  Distended bladder.   Original Report Authenticated By: Charline Bills, M.D.     Scheduled Meds:    . aspirin EC  81 mg Oral Daily  . cholecalciferol  1,000 Units Oral Daily  . DULoxetine  30 mg Oral QHS  . hydrALAZINE  5 mg Intravenous Once  . latanoprost  1 drop Both Eyes QHS  . pantoprazole  40 mg Oral Q1200  . Tamsulosin HCl  0.4 mg Oral Daily  . valACYclovir  500 mg Oral Daily  . DISCONTD: gabapentin  300 mg Oral BID  . DISCONTD: valACYclovir  1,000 mg Oral Daily   Continuous Infusions:    . sodium chloride 75 mL/hr at 12/04/11 2358  . heparin 1,000 Units/hr (12/05/11 0815)  . DISCONTD: heparin 1,000 Units/hr (12/04/11 1023)    Active Problems:  Herpes zoster  Cellulitis, leg  Thrombocytopenia  CAD (coronary artery disease)  CKD (chronic kidney disease), stage III    Time spent: 40 minutes    [facility]  [facility] Pager [phone]. If 8PM-8AM, please contact night-coverage at [email or web address], password [facility] 12/05/2011, 12:03 PM  LOS: 3 days

## 2011-12-05 NOTE — Progress Notes (Signed)
4098 Patient agitated/confused trying to get OOB bed alarm on. Dr. Carlye Grippe made aware

## 2011-12-05 NOTE — Progress Notes (Signed)
Antibiotics CONSULT NOTE - Follow Up Consult  Pharmacy Consult for vancomycin Indication:  cellulitis  Patient Measurements: Height: 5' 1.32" (155.8 cm) Weight: 190 lb 11.2 oz (86.5 kg) IBW/kg (Calculated) : 53.04  Heparin Dosing Weight: 71.5kg  Vital Signs: Temp: 98.9 F (37.2 C) (09/24 1958) Temp src: Oral (09/24 1958) BP: 161/85 mmHg (09/24 2011) Pulse Rate: 64  (09/24 1958)  Labs:  Basename 12/05/11 1405 12/05/11 0615 12/04/11 1416 12/04/11 0556 12/03/11 0750  HGB -- 13.4 -- 12.6* --  HCT -- 38.5* -- 37.4* 37.8*  PLT -- 99* -- 92* 97*  APTT 56* 55* 85* -- --  LABPROT -- -- -- -- --  INR -- -- -- -- --  HEPARINUNFRC 0.36 -- 0.98* 1.56* --  CREATININE -- 2.03* -- 4.36* 1.94*  CKTOTAL -- -- -- -- --  CKMB -- -- -- -- --  TROPONINI -- -- -- -- --    Estimated Creatinine Clearance: 22.7 ml/min (by C-G formula based on Cr of 2.03).  Assessment: Patient on vancomycin for cellulitis. Due to acute renal failure (resolving now) vancomycin was placed on hold and a random level was drawn tonight approximately 48h since last dose. The level came back below goal as somewhat expected given sudden improvement in scr overnight. Will redose vancomycin accordingly with 750mg  IV q 24 hour giving an expected trough ~15.  Goal of Therapy:  Vancomycin trough 10-15 mcg/mL   Plan:  Vancomycin 750mg  IV q24 hours Recheck vancomycin level at steady state or if sudden change in renal function   Sheppard Coil, PharmD Clinical Pharmacist 12/05/2011 8:38 PM

## 2011-12-05 NOTE — Progress Notes (Signed)
ANTICOAGULATION CONSULT NOTE - Follow Up Consult  Pharmacy Consult for heparin and vancomycin Indication: hx of DVT/PE and cellulitis  Allergies  Allergen Reactions  . Amoxicillin-Pot Clavulanate Nausea And Vomiting    Unknown-daughter states he can tolerate Amoxicillin ok  . Fludrocortisone Acetate Other (See Comments)    unknown  . Scopace (Scopolamine) Other (See Comments)    *patch Unknown reaction    Patient Measurements: Height: 5' 1.32" (155.8 cm) Weight: 190 lb 11.2 oz (86.5 kg) IBW/kg (Calculated) : 53.04  Heparin Dosing Weight: 71.5kg  Vital Signs: Temp: 98.6 F (37 C) (09/24 0459) Temp src: Axillary (09/24 0459) BP: 158/74 mmHg (09/24 0509) Pulse Rate: 68  (09/24 0509)  Labs:  Basename 12/05/11 1405 12/05/11 0615 12/04/11 1416 12/04/11 0556 12/03/11 0750 12/02/11 2019  HGB -- 13.4 -- 12.6* -- --  HCT -- 38.5* -- 37.4* 37.8* --  PLT -- 99* -- 92* 97* --  APTT 56* 55* 85* -- -- --  LABPROT -- -- -- -- -- 17.4*  INR -- -- -- -- -- 1.47  HEPARINUNFRC 0.36 -- 0.98* 1.56* -- --  CREATININE -- 2.03* -- 4.36* 1.94* --  CKTOTAL -- -- -- -- -- 89  CKMB -- -- -- -- -- --  TROPONINI -- -- -- -- -- --    Estimated Creatinine Clearance: 22.7 ml/min (by C-G formula based on Cr of 2.03).  Assessment: 78 yo male admitted for possible cellulitis with history of VTE and PE on Xarelto PTA. Xarelto held due to worsening renal function, to continue on heparin infusion. Heparin level is within goal at 0.36 and aPTT is below goal at 56, levels obtained 6h after rate change (8h may be more appropriate due to pt's age). Heparin level trending down and aPTT subtherapeutic, will increase infusion rate by a little. No bleeding or line issues per RN. CBC stable.   Pt is also on vancomycin per pharmacy. Pt received 1 g on 9/21, 750 mg on 9/22, held on 9/23 for large increase in SCr. SCr has trended back down (1.94>4.36>2.03), will obtain a random level today at 1800, ~48 h last dose.  CrCl ~ 22.7 ml/min. Pt is afeb, wbc wnl, blood cultures ngtd.   Goal of Therapy:  APTT 66 - 102 seconds Heparin level 0.3-0.7 units/ml Monitor platelets by anticoagulation protocol: Yes Vancomycin trough 10-15 mcg/mL   Plan:  Increase Heparin infusion to 1050 units/hr Check heparin level in 8 hours to confirm Daily heparin level and CBC Vancomycin trough at 1800 today   Nicolasa Ducking, PharmD Clinical Pharmacist Pgr [phone] 12/05/2011 3:15 PM

## 2011-12-05 NOTE — Clinical Social Work Psychosocial (Signed)
     Clinical Social Work Department BRIEF PSYCHOSOCIAL ASSESSMENT 12/05/2011  Patient:  Jason [name], Jason [name]     Account Number:  [id number]     Admit date:  12/02/2011  Clinical Social Worker:  Jacelyn Grip  Date/Time:  12/05/2011 03:30 PM  Referred by:  Physician  Date Referred:  12/05/2011 Referred for  SNF Placement   Other Referral:   Interview type:  Family Other interview type:    PSYCHOSOCIAL DATA Living Status:  WIFE Admitted from facility:   Level of care:   Primary support name:  Genelle Bal Naim/son/[phone] Primary support relationship to patient:  CHILD, ADULT Degree of support available:   strong    CURRENT CONCERNS Current Concerns  Post-Acute Placement   Other Concerns:    SOCIAL WORK ASSESSMENT / PLAN CSW received notification from MD that pt will likely need SNF placement at discharge. CSW reviewed chart and PT was ordered this afternoon, but pt has not yet been evaluated by PT. CSW met with pt son in hallway to assess. Pt son reports that pt has been experiencing confusion during admission and currently has sitter present. Pt son stated that pt lives with pt wife and family has 24 hour care for pt and pt wife in the home. CSW discussed with pt son that PT plans to evaluate tomorrow and RNCM and CSW can provide assistance with discharge planning needs. Pt son is hopeful that pt can return home with home health services in addition to the 24 hour care that pt family already has arranged. CSW to continue to follow and assist with discharge planning needs as appropriate.   Assessment/plan status:  Psychosocial Support/Ongoing Assessment of Needs Other assessment/ plan:   discharge planning   Information/referral to community resources:   Awaiting PT evaluation to make the appropriate referrals for discharge planning    PATIENTS/FAMILYS RESPONSE TO PLAN OF CARE: Per pt son, pt experiencing a lot of confusion today and per chart, pt disoriented x 4. Pt son is  very supportive and actively involved in pt care. Will await PT eval to determine pt discharge needs.

## 2011-12-06 DIAGNOSIS — L03119 Cellulitis of unspecified part of limb: Secondary | ICD-10-CM

## 2011-12-06 DIAGNOSIS — L02419 Cutaneous abscess of limb, unspecified: Secondary | ICD-10-CM

## 2011-12-06 LAB — BASIC METABOLIC PANEL
CO2: 22 mEq/L (ref 19–32)
GFR calc non Af Amer: 54 mL/min — ABNORMAL LOW (ref >90)
Glucose, Bld: 82 mg/dL (ref 70–99)
Potassium: 4 mEq/L (ref 3.5–5.1)
Sodium: 140 mEq/L (ref 135–145)

## 2011-12-06 LAB — CBC
HCT: 38.3 % — ABNORMAL LOW (ref 39.0–52.0)
Hemoglobin: 13.7 g/dL (ref 13.0–17.0)
RDW: 13.8 % (ref 11.5–15.5)
WBC: 6.5 10*3/uL (ref 4.0–10.5)

## 2011-12-06 LAB — APTT: aPTT: 60 seconds — ABNORMAL HIGH (ref 24–37)

## 2011-12-06 MED ORDER — VALACYCLOVIR HCL 500 MG PO TABS
1000.0000 mg | ORAL_TABLET | Freq: Every day | ORAL | Status: DC
  Administered 2011-12-06 (×2): 500 mg via ORAL
  Administered 2011-12-07 – 2011-12-09 (×3): 1000 mg via ORAL
  Filled 2011-12-06 (×4): qty 2

## 2011-12-06 MED ORDER — VANCOMYCIN HCL IN DEXTROSE 1-5 GM/200ML-% IV SOLN
1000.0000 mg | INTRAVENOUS | Status: DC
  Administered 2011-12-06 – 2011-12-07 (×2): 1000 mg via INTRAVENOUS
  Filled 2011-12-06 (×3): qty 200

## 2011-12-06 NOTE — Progress Notes (Signed)
PATIENT DETAILS Name: Jason [name] Age: 78 y.o. Sex: male Date of Birth: 18-Mar-1919 Admit Date: 12/02/2011 Admitting Physician Catarina Hartshorn, MD ZOX:[id number] TOM, MD  Subjective: Seems more awake and alert today, pinkish tinge to the urine in the foley bag  Assessment/Plan: Active Problems: Herpes zoster dermatitis  -involving the left gluteal region and the left upper thigh -seems to be much better -continue with Valtrex   Cellulitis, left Buttock -better -c/w Vanco  Acute renal failure, on CKD stage II- III -resolved -2/2 obstruction -keep foley in place-will need outpatient appointment with Urology -c/w Flomax  Mild Hematuria -likely 2/2 to foley trauma -monitor hor now-on IV heparin gtt   Thrombocytopenia -stable, recheck in am  Coronary artery disease  -History of bare-metal stent, continue aspirin.  -History of recurrent DVT, legs  -Patient was on  Xarelto,currently on Heparing gtt-back to xarelto on discharge  Disposition: Remain inpatient  DVT Prophylaxis: Not needed as on Full dose Heparin  Code Status: Full code  Procedures:  None  CONSULTS:  None  PHYSICAL EXAM: Vital signs in last 24 hours: Filed Vitals:   12/05/11 0509 12/05/11 1958 12/05/11 2011 12/06/11 0534  BP: 158/74 175/78 161/85 159/98  Pulse: 68 64  82  Temp:  98.9 F (37.2 C)  97.7 F (36.5 C)  TempSrc:  Oral  Oral  Resp:  18  20  Height:      Weight:    85.8 kg (189 lb 2.5 oz)  SpO2:  96%  94%    Weight change: -0.7 kg (-1 lb 8.7 oz) Body mass index is 35.37 kg/(m^2).   Gen Exam: Awake and alert with clear speech.   Neck: Supple, No JVD.   Chest: B/L Clear.   CVS: S1 S2 Regular, no murmurs.  Abdomen: soft, BS +, non tender, non distended.  Extremities: no edema, lower extremities warm to touch. Neurologic: Non Focal.   Skin: still with some erythema in the left gluteal area, blisters not seen   Wounds: N/A.   Intake/Output from previous  day:  Intake/Output Summary (Last 24 hours) at 12/06/11 1300 Last data filed at 12/06/11 1015  Gross per 24 hour  Intake 1211.98 ml  Output   2225 ml  Net -1013.02 ml     LAB RESULTS: CBC  Lab 12/06/11 0435 12/05/11 0615 12/04/11 0556 12/03/11 0750 12/02/11 1633  WBC 6.5 7.6 6.3 5.8 3.9*  HGB 13.7 13.4 12.6* 13.1 13.8  HCT 38.3* 38.5* 37.4* 37.8* 40.0  PLT 114* 99* 92* 97* 104*  MCV 93.2 93.7 95.7 94.7 95.9  MCH 33.3 32.6 32.2 32.8 33.1  MCHC 35.8 34.8 33.7 34.7 34.5  RDW 13.8 14.0 14.3 14.0 14.2  LYMPHSABS -- -- -- -- 1.1  MONOABS -- -- -- -- 0.5  EOSABS -- -- -- -- 0.1  BASOSABS -- -- -- -- 0.0  BANDABS -- -- -- -- --    Chemistries   Lab 12/06/11 0435 12/05/11 0615 12/04/11 0556 12/03/11 0750 12/02/11 1633  NA 140 137 132* 132* 133*  K 4.0 4.0 4.6 3.6 3.6  CL 106 104 102 100 100  CO2 22 20 18* 21 24  GLUCOSE 82 87 90 93 108*  BUN 12 21 32* 18 16  CREATININE 1.16 2.03* 4.36* 1.94* 1.30  CALCIUM 8.9 8.7 8.0* 7.9* 8.4  MG -- -- -- -- --    CBG: No results found for this basename: GLUCAP:5 in the last 168 hours  GFR Estimated Creatinine Clearance: 39.6 ml/min (by C-G  formula based on Cr of 1.16).  Coagulation profile  Lab 12/02/11 2019  INR 1.47  PROTIME --    Cardiac Enzymes No results found for this basename: CK:3,CKMB:3,TROPONINI:3,MYOGLOBIN:3 in the last 168 hours  No components found with this basename: POCBNP:3 No results found for this basename: DDIMER:2 in the last 72 hours No results found for this basename: HGBA1C:2 in the last 72 hours No results found for this basename: CHOL:2,HDL:2,LDLCALC:2,TRIG:2,CHOLHDL:2,LDLDIRECT:2 in the last 72 hours No results found for this basename: TSH,T4TOTAL,FREET3,T3FREE,THYROIDAB in the last 72 hours No results found for this basename: VITAMINB12:2,FOLATE:2,FERRITIN:2,TIBC:2,IRON:2,RETICCTPCT:2 in the last 72 hours No results found for this basename: LIPASE:2,AMYLASE:2 in the last 72 hours  Urine  Studies No results found for this basename: UACOL:2,UAPR:2,USPG:2,UPH:2,UTP:2,UGL:2,UKET:2,UBIL:2,UHGB:2,UNIT:2,UROB:2,ULEU:2,UEPI:2,UWBC:2,URBC:2,UBAC:2,CAST:2,CRYS:2,UCOM:2,BILUA:2 in the last 72 hours  MICROBIOLOGY: Recent Results (from the past 240 hour(s))  CULTURE, BLOOD (ROUTINE X 2)     Status: Normal (Preliminary result)   Collection Time   12/02/11  4:25 PM      Component Value Range Status Comment   Specimen Description BLOOD LEFT HAND   Final    Special Requests BOTTLES DRAWN AEROBIC ONLY 10CC   Final    Culture  Setup Time 12/03/2011 11:40   Final    Culture     Final    Value:        BLOOD CULTURE RECEIVED NO GROWTH TO DATE CULTURE WILL BE HELD FOR 5 DAYS BEFORE ISSUING A FINAL NEGATIVE REPORT   Report Status PENDING   Incomplete   CULTURE, BLOOD (ROUTINE X 2)     Status: Normal (Preliminary result)   Collection Time   12/02/11  4:32 PM      Component Value Range Status Comment   Specimen Description BLOOD LEFT HAND   Final    Special Requests BOTTLES DRAWN AEROBIC ONLY   Final    Culture  Setup Time 12/03/2011 11:40   Final    Culture     Final    Value:        BLOOD CULTURE RECEIVED NO GROWTH TO DATE CULTURE WILL BE HELD FOR 5 DAYS BEFORE ISSUING A FINAL NEGATIVE REPORT   Report Status PENDING   Incomplete   URINE CULTURE     Status: Normal   Collection Time   12/02/11  5:19 PM      Component Value Range Status Comment   Specimen Description URINE, CLEAN CATCH   Final    Special Requests NONE   Final    Culture  Setup Time 12/03/2011 11:39   Final    Colony Count 85,000 COLONIES/ML   Final    Culture     Final    Value: Multiple bacterial morphotypes present, none predominant. Suggest appropriate recollection if clinically indicated.   Report Status 12/04/2011 FINAL   Final     RADIOLOGY STUDIES/RESULTS: Ct Abdomen Pelvis Wo Contrast  12/03/2011  *RADIOLOGY REPORT*  Clinical Data: Abdominal pain  CT ABDOMEN AND PELVIS WITHOUT CONTRAST  Technique:   Multidetector CT imaging of the abdomen and pelvis was performed following the standard protocol without intravenous contrast.  Comparison: 04/25/2011  Findings: Probable dependent atelectasis in the bilateral lung bases.  Small hiatal hernia.  Unenhanced liver, spleen, pancreas, and adrenal glands are within normal limits.  Gallbladder is unremarkable.  No intrahepatic or extrahepatic ductal dilatation.  At least two punctate nonobstructing right renal calculi. Bilateral renal sinus cysts.  Extrarenal pelves.  No hydronephrosis.  No evidence of bowel obstruction.  Normal appendix.  Atherosclerotic  calcifications of the abdominal aorta and branch vessels.  No abdominopelvic ascites.  No suspicious abdominopelvic lymphadenopathy.  Prostate is unremarkable.  No ureteral or bladder calculi.  Small fat-containing left inguinal hernia.  Degenerative changes of the visualized thoracolumbar spine.  IMPRESSION: No evidence of bowel obstruction.  Normal appendix.  Two punctate nonobstructing right renal calculi.  No ureteral or bladder calculi.  No hydronephrosis.  No CT findings to account for the patient's abdominal pain.   Original Report Authenticated By: Charline Bills, M.D.    US Renal  12/04/2011  *RADIOLOGY REPORT*  Clinical Data: Renal failure  RENAL/URINARY TRACT ULTRASOUND COMPLETE  Comparison:  CT abdomen pelvis dated 12/02/2011 and 04/25/2011  Findings:  Right Kidney:  Measures 11.1 cm.  Mild fullness of the renal collecting system with an extrarenal pelvis, unchanged from prior CTs.  Left Kidney:  Measures 11.2 cm.  Mild fullness of the renal collecting system with an extrarenal pelvis, unchanged from prior CTs.  Bladder:  Distended.  IMPRESSION: Mild fullness of the bilateral renal collecting systems with extrarenal pelves, unchanged from prior CTs.  Distended bladder.   Original Report Authenticated By: Charline Bills, M.D.    Dg Chest Port 1 View  12/02/2011  *RADIOLOGY REPORT*  Clinical Data:  Fever, weakness, shortness of breath  PORTABLE CHEST - 1 VIEW  Comparison: 11/13/2010  Findings: Lungs are essentially clear.  No focal dilatation. No pleural effusion or pneumothorax.  The heart is normal in size.  IMPRESSION: No evidence of acute cardiopulmonary disease.   Original Report Authenticated By: Charline Bills, M.D.     MEDICATIONS: Scheduled Meds:   . aspirin EC  81 mg Oral Daily  . cholecalciferol  1,000 Units Oral Daily  . DULoxetine  30 mg Oral QHS  . hydrALAZINE  5 mg Intravenous Once  . latanoprost  1 drop Both Eyes QHS  . pantoprazole  40 mg Oral Q1200  . Tamsulosin HCl  0.4 mg Oral Daily  . valACYclovir  1,000 mg Oral Daily  . vancomycin  1,000 mg Intravenous Q24H  . DISCONTD: valACYclovir  500 mg Oral Daily  . DISCONTD: vancomycin  750 mg Intravenous Q24H   Continuous Infusions:   . sodium chloride 75 mL/hr at 12/06/11 0327  . heparin 1,050 Units/hr (12/05/11 1542)  . DISCONTD: heparin 1,000 Units/hr (12/05/11 1428)   PRN Meds:.acetaminophen, acetaminophen, ALPRAZolam, hydrALAZINE, HYDROcodone-acetaminophen, morphine injection, ondansetron (ZOFRAN) IV, ondansetron  Antibiotics: Anti-infectives     Start     Dose/Rate Route Frequency Ordered Stop   12/06/11 2200   vancomycin (VANCOCIN) IVPB 1000 mg/200 mL premix        1,000 mg 200 mL/hr over 60 Minutes Intravenous Every 24 hours 12/06/11 0821     12/06/11 1000   valACYclovir (VALTREX) tablet 1,000 mg        1,000 mg Oral Daily 12/06/11 0931     12/05/11 2200   vancomycin (VANCOCIN) 750 mg in sodium chloride 0.9 % 150 mL IVPB  Status:  Discontinued        750 mg 150 mL/hr over 60 Minutes Intravenous Every 24 hours 12/05/11 2037 12/06/11 0821   12/05/11 1000   valACYclovir (VALTREX) tablet 500 mg  Status:  Discontinued        500 mg Oral Daily 12/04/11 1642 12/06/11 0931   12/03/11 1800   vancomycin (VANCOCIN) IVPB 1000 mg/200 mL premix  Status:  Discontinued        1,000 mg 200 mL/hr over 60  Minutes Intravenous Every 24 hours  12/02/11 2208 12/03/11 1005   12/03/11 1800   vancomycin (VANCOCIN) 750 mg in sodium chloride 0.9 % 150 mL IVPB  Status:  Discontinued        750 mg 150 mL/hr over 60 Minutes Intravenous Every 24 hours 12/03/11 1005 12/04/11 0946   12/03/11 1100   valACYclovir (VALTREX) tablet 1,000 mg  Status:  Discontinued        1,000 mg Oral Daily 12/03/11 1019 12/04/11 1642   12/02/11 2200   valACYclovir (VALTREX) tablet 1,000 mg  Status:  Discontinued        1,000 mg Oral 2 times daily 12/02/11 2149 12/03/11 1019   12/02/11 1845   vancomycin (VANCOCIN) IVPB 1000 mg/200 mL premix        1,000 mg 200 mL/hr over 60 Minutes Intravenous  Once 12/02/11 1837 12/02/11 Benay Pike, MD  [facility] Pager:[phone]  If 7PM-7AM, please contact night-coverage [email or web address] Password [email or web address] 12/06/2011, 1:00 PM   LOS: 4 days

## 2011-12-06 NOTE — Progress Notes (Signed)
Occupational Therapy Evaluation Patient Details Name: Jason [name] MRN: [id number] DOB: [birth year] Today's Date: 12/06/2011 Time: 1324-4010 OT Time Calculation (min): 17 min  OT Assessment / Plan / Recommendation Clinical Impression  Pleasant 78 yo with Shingles. Pt has 24/7 private care who is able to provide level of assistance needed. Pt has nec DME, however, rec small loop bedrail to increase independence and safety with bed mobility at home. Discussed availability with daughter. Pt given theraband ex for general BUE strengthening, in addition to core strengthening ex. No further OT needed.    OT Assessment  Patient does not need any further OT services    Follow Up Recommendations  No OT follow up    Barriers to Discharge  none    Equipment Recommendations  Other (comment) (bed rail)    Recommendations for Other Services  none  Frequency    eval only   Precautions / Restrictions Precautions Precautions: Fall Precaution Comments: Airborne precautions from shingles Restrictions Weight Bearing Restrictions: No   Pertinent Vitals/Pain No pain    ADL  Eating/Feeding: Simulated;Modified independent Where Assessed - Eating/Feeding: Chair Grooming: Performed;Supervision/safety Where Assessed - Grooming: Supported standing Upper Body Bathing: Simulated;Set up Where Assessed - Upper Body Bathing: Unsupported sitting Lower Body Bathing: Simulated;Moderate assistance Where Assessed - Lower Body Bathing: Supported sit to stand Upper Body Dressing: Simulated;Minimal assistance Where Assessed - Upper Body Dressing: Unsupported sitting Lower Body Dressing: Simulated;Moderate assistance Where Assessed - Lower Body Dressing: Supported sit to Pharmacist, hospital: Performed;Minimal Dentist Method: Sit to Barista: Materials engineer and Hygiene: Simulated;Supervision/safety Where Assessed - Medical sales representative and Hygiene: Standing Equipment Used: Gait belt;Rolling walker Transfers/Ambulation Related to ADLs: Min A. Limited by weakness ADL Comments: Limited by fatigue    OT Diagnosis:    OT Problem List:   OT Treatment Interventions:     OT Goals Acute Rehab OT Goals OT Goal Formulation:  (eval only)  Visit Information  Last OT Received On: 12/06/11 Assistance Needed: +1 (+2 for lines)    Subjective Data      Prior Functioning     Home Living Lives With: Spouse Available Help at Discharge: Family;Available 24 hours/day;Personal care attendant Type of Home: House Home Access: Stairs to enter;Ramped entrance Entrance Stairs-Number of Steps: 1 Home Layout: Two level;Able to live on main level with bedroom/bathroom Alternate Level Stairs-Number of Steps: family reports pt doesn't ever go upstairs Bathroom Shower/Tub: Other (comment) (walk in tub ) Bathroom Toilet: Handicapped height Bathroom Accessibility: Yes How Accessible: Accessible via walker Home Adaptive Equipment: Walker - rolling;Walker - standard;Walker - four wheeled Additional Comments: has 24/7 aids who assist with care Prior Function Level of Independence: Needs assistance;Independent with assistive device(s) Needs Assistance: Meal Prep;Light Housekeeping Meal Prep: Moderate Light Housekeeping: Moderate Vocation: Retired Comments: Wife has dementia and is wheelchair bound Musician: No difficulties Dominant Hand: Right         Vision/Perception     Cognition  Overall Cognitive Status: History of cognitive impairments - at baseline Area of Impairment: Memory;Problem solving Arousal/Alertness: Awake/alert Orientation Level: Disoriented to;Situation Behavior During Session: WFL for tasks performed Memory: Decreased recall of precautions Problem Solving: Requires increased time for processing. Cognition - Other Comments: Very pleasant but slightly confused, difficulty  following commands sometimes but likely because he is hard of hearing    Extremity/Trunk Assessment Right Upper Extremity Assessment RUE ROM/Strength/Tone: [facility] for tasks assessed Left Upper Extremity Assessment LUE ROM/Strength/Tone: [facility] for tasks  assessed LUE Sensation: WFL - Light Touch;WFL - Proprioception LUE Coordination: WFL - gross/fine motor Right Lower Extremity Assessment RLE ROM/Strength/Tone: WFL for tasks assessed RLE Sensation: History of peripheral neuropathy Left Lower Extremity Assessment LLE ROM/Strength/Tone: WFL for tasks assessed LLE Sensation: History of peripheral neuropathy Trunk Assessment Trunk Assessment: Normal     Mobility Bed Mobility Bed Mobility: Supine to Sit Supine to Sit: 5: Supervision;HOB elevated (30 degrees) Details for Bed Mobility Assistance: cues for efficiency of movement Transfers Transfers: Sit to Stand;Stand to Sit Sit to Stand: 4: Min assist;From elevated surface;With upper extremity assist;From chair/3-in-1 Stand to Sit: 4: Min guard;With upper extremity assist;To chair/3-in-1 Details for Transfer Assistance: vc for safety     Shoulder Instructions     Exercise  BUE strengthening with theraband level 1 and core strengthening ex. Daughter present and verbalized understanding.   Balance  Min guard   End of Session OT - End of Session Equipment Utilized During Treatment: Gait belt Activity Tolerance: Patient limited by fatigue Patient left: in chair;with call bell/phone within reach;with nursing in room;with family/visitor present Nurse Communication: Mobility status  GO     Karlos Scadden,HILLARY 12/06/2011, 2:19 PM [facility], OTR/L  [phone] 12/06/2011

## 2011-12-06 NOTE — Progress Notes (Signed)
ANTICOAGULATION CONSULT NOTE - Follow Up Consult  Pharmacy Consult for heparin and vancomycin Indication: hx of DVT/PE and cellulitis  Allergies  Allergen Reactions  . Amoxicillin-Pot Clavulanate Nausea And Vomiting    Unknown-daughter states he can tolerate Amoxicillin ok  . Fludrocortisone Acetate Other (See Comments)    unknown  . Scopace (Scopolamine) Other (See Comments)    *patch Unknown reaction    Patient Measurements: Height: 5' 1.32" (155.8 cm) Weight: 189 lb 2.5 oz (85.8 kg) IBW/kg (Calculated) : 53.04  Heparin Dosing Weight: 71.5kg  Vital Signs: Temp: 97.7 F (36.5 C) (09/25 0534) Temp src: Oral (09/25 0534) BP: 159/98 mmHg (09/25 0534) Pulse Rate: 82  (09/25 0534)  Labs:  Basename 12/06/11 0435 12/05/11 1405 12/05/11 0615 12/04/11 1416 12/04/11 0556  HGB 13.7 -- 13.4 -- --  HCT 38.3* -- 38.5* -- 37.4*  PLT 114* -- 99* -- 92*  APTT 60* 56* 55* -- --  LABPROT -- -- -- -- --  INR -- -- -- -- --  HEPARINUNFRC 0.44 0.36 -- 0.98* --  CREATININE 1.16 -- 2.03* -- 4.36*  CKTOTAL -- -- -- -- --  CKMB -- -- -- -- --  TROPONINI -- -- -- -- --    Estimated Creatinine Clearance: 39.6 ml/min (by C-G formula based on Cr of 1.16).  Assessment: 78 yo male admitted for possible cellulitis with history of VTE and PE on Xarelto PTA. Xarelto held due to worsening renal function, to continue on heparin infusion. Heparin level is within goal at 0.44 , aPTT 60 sec.  Heparin level and aPTT are correlating so will follow heparin levels and dc aPTT. CBC stable.   Pt is also on vancomycin per pharmacy. Pt received 1 g on 9/21, 750 mg on 9/22, held on 9/23 for large increase in SCr. SCr has trended back down. CrCl ~ 39 ml/min. Pt is afeb, wbc wnl, blood cultures ngtd.   Goal of Therapy:  Heparin level 0.3-0.7 units/ml Monitor platelets by anticoagulation protocol: Yes Vancomycin trough 10-15 mcg/mL   Plan:  Cont Heparin infusion at 1050 units/hr Continue daily heparin levels  and CBC Increase vancomycin to 1 gm IV q24 hours.   Talbert Cage, PharmD Clinical Pharmacist Pgr [phone] 12/06/2011 8:12 AM

## 2011-12-06 NOTE — Plan of Care (Signed)
Problem: Phase III Progression Outcomes Goal: Voiding independently Outcome: Not Applicable Date Met:  12/06/11 Will be discharged with a foley

## 2011-12-06 NOTE — Evaluation (Signed)
Physical Therapy Evaluation Patient Details Name: Jason [name] MRN: [id number] DOB: 12-Nov-1919 Today's Date: 12/06/2011 Time: 4098-1191 PT Time Calculation (min): 28 min  PT Assessment / Plan / Recommendation Clinical Impression  [name] is 78 y/o male admitted for fever, cellulitis and herpes zoster. Presents to PT today with generalized weakness affecting his balance and safety with mobility. Will benefit physical therapy in the acute setting to address these and the below deficits so as to maximize mobilty and facilitate a safe d/c. Rec HHPT with 24 hour supervision and at least minA for all OOB mobility. Ed. Pt and family on ankle pumps for HEP. Recommend nursing be getting pt up to bathroom with RW and 1 assist for safety. Family aware of recommendations    PT Assessment  Patient needs continued PT services    Follow Up Recommendations  Home health PT;Supervision/Assistance - 24 hour (minA for all OOB/ambulation/transfers)    Barriers to Discharge        Equipment Recommendations  None recommended by PT    Recommendations for Other Services OT consult   Frequency Min 3X/week    Precautions / Restrictions Precautions Precautions: Fall Precaution Comments: Airborne precautions from shingles         Mobility  Bed Mobility Bed Mobility: Supine to Sit Supine to Sit: 5: Supervision;HOB elevated (30 degrees) Details for Bed Mobility Assistance: cues for efficiency of movement Transfers Transfers: Sit to Stand;Stand to Sit Sit to Stand: 3: Mod assist;4: Min assist;With upper extremity assist;From bed;From chair/3-in-1 Stand to Sit: To chair/3-in-1;To toilet Details for Transfer Assistance: verbal cues for safe hand placement and set up in prep for safe transfer, facilitation for anterior translation of trunk over BOS and power up to stand, improved ability to stand with practice, sit<>stand x3 Ambulation/Gait Ambulation/Gait Assistance: 4: Min assist Ambulation Distance  (Feet): 25 Feet Assistive device: Rolling walker Ambulation/Gait Assistance Details: Limited to ambulation in the room because of airborne precautions, cues for tall posture and safe negotiation of RW as well as stepping into RW during turns Gait Pattern: Step-through pattern;Trunk flexed General Gait Details: shuffled    Shoulder Instructions     Exercises General Exercises - Lower Extremity Toe Raises: AROM;Both;10 reps;Seated Heel Raises: AROM;Both;10 reps;Seated   PT Diagnosis: Difficulty walking;Abnormality of gait;Generalized weakness;Altered mental status  PT Problem List: Decreased strength;Decreased activity tolerance;Decreased balance;Decreased mobility;Decreased cognition;Decreased knowledge of use of DME;Decreased safety awareness;Impaired sensation PT Treatment Interventions: DME instruction;Gait training;Functional mobility training;Therapeutic activities;Therapeutic exercise;Balance training;Neuromuscular re-education;Cognitive remediation;Patient/family education   PT Goals Acute Rehab PT Goals PT Goal Formulation: With patient Time For Goal Achievement: 12/13/11 Pt will go Sit to Stand: with supervision PT Goal: Sit to Stand - Progress: Goal set today Pt will go Stand to Sit: with supervision PT Goal: Stand to Sit - Progress: Goal set today Pt will Transfer Bed to Chair/Chair to Bed: with supervision PT Transfer Goal: Bed to Chair/Chair to Bed - Progress: Goal set today Pt will Ambulate: 16 - 50 feet;with supervision;with least restrictive assistive device PT Goal: Ambulate - Progress: Goal set today Pt will Perform Home Exercise Program: with supervision, verbal cues required/provided PT Goal: Perform Home Exercise Program - Progress: Goal set today  Visit Information  Last PT Received On: 12/06/11 Assistance Needed: +1 (+2 for lines)    Subjective Data  Subjective: I think I have some help at home, I don't know you'll haev to ask my daughter.  Patient Stated  Goal: Family would like patient to go home with 24 hour  hired care   Prior Functioning  Home Living Lives With: Spouse Available Help at Discharge: Family;Available 24 hours/day;Personal care attendant Type of Home: House Home Access: Stairs to enter;Ramped entrance Entrance Stairs-Number of Steps: 1 Home Layout: Two level;Able to live on main level with bedroom/bathroom Alternate Level Stairs-Number of Steps: family reports pt doesn't ever go upstairs Bathroom Shower/Tub: Other (comment) (walk in tub ) Home Adaptive Equipment: Walker - rolling;Walker - standard;Walker - four wheeled Prior Function Level of Independence: Needs assistance;Independent with assistive device(s) Needs Assistance: Meal Prep;Light Housekeeping Meal Prep: Moderate Light Housekeeping: Moderate Vocation: Retired Comments: Wife has dementia and is wheelchair bound Musician: No difficulties    Cognition  Overall Cognitive Status: Impaired Orientation Level: Disoriented to;Situation Behavior During Session: WFL for tasks performed Cognition - Other Comments: Very pleasant but slightly confused, difficulty following commands sometimes but likely because he is hard of hearing    Extremity/Trunk Assessment Right Upper Extremity Assessment RUE ROM/Strength/Tone: [facility] for tasks assessed Left Upper Extremity Assessment LUE ROM/Strength/Tone: WFL for tasks assessed Right Lower Extremity Assessment RLE ROM/Strength/Tone: WFL for tasks assessed RLE Sensation: History of peripheral neuropathy Left Lower Extremity Assessment LLE ROM/Strength/Tone: WFL for tasks assessed LLE Sensation: History of peripheral neuropathy   Balance    End of Session PT - End of Session Equipment Utilized During Treatment: Gait belt Activity Tolerance: Patient tolerated treatment well;Patient limited by fatigue Patient left: Other (comment) (on toilet with family present, RN aware) Nurse Communication: Mobility status   GP     [facility] HELEN 12/06/2011, 2:03 PM

## 2011-12-07 ENCOUNTER — Encounter (HOSPITAL_COMMUNITY): Payer: Self-pay | Admitting: Nurse Practitioner

## 2011-12-07 DIAGNOSIS — I2510 Atherosclerotic heart disease of native coronary artery without angina pectoris: Secondary | ICD-10-CM

## 2011-12-07 DIAGNOSIS — I10 Essential (primary) hypertension: Secondary | ICD-10-CM

## 2011-12-07 LAB — BASIC METABOLIC PANEL
CO2: 21 mEq/L (ref 19–32)
Chloride: 102 mEq/L (ref 96–112)
Creatinine, Ser: 1.01 mg/dL (ref 0.50–1.35)
GFR calc Af Amer: 73 mL/min — ABNORMAL LOW (ref >90)
Potassium: 3.4 mEq/L — ABNORMAL LOW (ref 3.5–5.1)
Sodium: 138 mEq/L (ref 135–145)

## 2011-12-07 LAB — CBC
Hemoglobin: 13.6 g/dL (ref 13.0–17.0)
MCH: 32.6 pg (ref 26.0–34.0)
Platelets: 155 10*3/uL (ref 150–400)
RBC: 4.17 MIL/uL — ABNORMAL LOW (ref 4.22–5.81)
WBC: 7.4 10*3/uL (ref 4.0–10.5)

## 2011-12-07 LAB — HEPARIN LEVEL (UNFRACTIONATED): Heparin Unfractionated: 0.37 IU/mL (ref 0.30–0.70)

## 2011-12-07 MED ORDER — WHITE PETROLATUM GEL
Status: AC
  Administered 2011-12-07: 13:00:00
  Filled 2011-12-07: qty 5

## 2011-12-07 MED ORDER — DILTIAZEM HCL 100 MG IV SOLR
5.0000 mg/h | INTRAVENOUS | Status: DC
  Filled 2011-12-07: qty 100

## 2011-12-07 MED ORDER — AMIODARONE HCL IN DEXTROSE 360-4.14 MG/200ML-% IV SOLN
30.0000 mg/h | INTRAVENOUS | Status: DC
  Administered 2011-12-08: 30 mg/h via INTRAVENOUS
  Filled 2011-12-07 (×3): qty 200

## 2011-12-07 MED ORDER — POTASSIUM CHLORIDE CRYS ER 20 MEQ PO TBCR
40.0000 meq | EXTENDED_RELEASE_TABLET | Freq: Once | ORAL | Status: AC
  Administered 2011-12-07: 40 meq via ORAL
  Filled 2011-12-07: qty 2

## 2011-12-07 MED ORDER — AMIODARONE LOAD VIA INFUSION
150.0000 mg | Freq: Once | INTRAVENOUS | Status: DC
  Administered 2011-12-07: 150 mg via INTRAVENOUS
  Filled 2011-12-07: qty 83.34

## 2011-12-07 MED ORDER — FINASTERIDE 5 MG PO TABS
5.0000 mg | ORAL_TABLET | Freq: Every day | ORAL | Status: DC
  Administered 2011-12-07 – 2011-12-09 (×3): 5 mg via ORAL
  Filled 2011-12-07 (×3): qty 1

## 2011-12-07 MED ORDER — ENOXAPARIN SODIUM 40 MG/0.4ML ~~LOC~~ SOLN
40.0000 mg | SUBCUTANEOUS | Status: DC
Start: 2011-12-08 — End: 2011-12-08
  Filled 2011-12-07: qty 0.4

## 2011-12-07 MED ORDER — AMIODARONE HCL IN DEXTROSE 360-4.14 MG/200ML-% IV SOLN
60.0000 mg/h | INTRAVENOUS | Status: AC
  Administered 2011-12-07 (×2): 60 mg/h via INTRAVENOUS
  Filled 2011-12-07 (×3): qty 200

## 2011-12-07 MED ORDER — SODIUM CHLORIDE 0.9 % IV SOLN
INTRAVENOUS | Status: DC
  Administered 2011-12-07: 16:00:00 via INTRAVENOUS

## 2011-12-07 MED ORDER — DILTIAZEM LOAD VIA INFUSION
20.0000 mg | Freq: Once | INTRAVENOUS | Status: AC
  Administered 2011-12-07: 20 mg via INTRAVENOUS
  Filled 2011-12-07: qty 20

## 2011-12-07 NOTE — Progress Notes (Signed)
ANTICOAGULATION CONSULT NOTE - Follow Up Consult  Pharmacy Consult for heparin  Indication: hx of DVT/PE   Allergies  Allergen Reactions  . Amoxicillin-Pot Clavulanate Nausea And Vomiting    Unknown-daughter states he can tolerate Amoxicillin ok  . Fludrocortisone Acetate Other (See Comments)    unknown  . Scopace (Scopolamine) Other (See Comments)    *patch Unknown reaction    Patient Measurements: Height: 5' 1.32" (155.8 cm) Weight: 188 lb 15 oz (85.7 kg) IBW/kg (Calculated) : 53.04  Heparin Dosing Weight: 71.5kg  Vital Signs: Temp: 98.4 F (36.9 C) (09/26 0535) Temp src: Oral (09/26 0535) BP: 172/88 mmHg (09/26 0535) Pulse Rate: 81  (09/26 0535)  Labs:  Basename 12/07/11 0507 12/06/11 0435 12/05/11 1405 12/05/11 0615  HGB 13.6 13.7 -- --  HCT 38.7* 38.3* -- 38.5*  PLT 155 114* -- 99*  APTT -- 60* 56* 55*  LABPROT -- -- -- --  INR -- -- -- --  HEPARINUNFRC 0.37 0.44 0.36 --  CREATININE 1.01 1.16 -- 2.03*  CKTOTAL -- -- -- --  CKMB -- -- -- --  TROPONINI -- -- -- --    Estimated Creatinine Clearance: 45.4 ml/min (by C-G formula based on Cr of 1.01).  Assessment: 78 yo male admitted for possible cellulitis with history of VTE and PE on Xarelto PTA. Xarelto held due to worsening renal function, to continue on heparin infusion. Heparin level is within goal at 0.37. CBC stable.   Goal of Therapy:  Heparin level 0.3-0.7 units/ml Monitor platelets by anticoagulation protocol: Yes    Plan:  Cont Heparin infusion at 1050 units/hr Continue daily heparin levels and CBC   Talbert Cage, PharmD Clinical Pharmacist Pgr [phone] 12/07/2011 8:40 AM

## 2011-12-07 NOTE — Care Management Note (Unsigned)
    Page 1 of 1   12/07/2011     3:39:08 PM   CARE MANAGEMENT NOTE 12/07/2011  Patient:  Jason [name], Jason [name]   Account Number:  [id number]  Date Initiated:  12/04/2011  Documentation initiated by:  Letha Cape  Subjective/Objective Assessment:   dx herpes zoster; cellulitis/shingles, hx of blood clots  admit- lives with spouse,     Action/Plan:   pt eval- rec hhpt,  pt may need HHRN as well.   Anticipated DC Date:  12/06/2011   Anticipated DC Plan:  HOME W HOME HEALTH SERVICES      DC Planning Services  CM consult      Choice offered to / List presented to:             Status of service:  In process, will continue to follow Medicare Important Message given?   (If response is "NO", the following Medicare IM given date fields will be blank) Date Medicare IM given:   Date Additional Medicare IM given:    Discharge Disposition:    Per UR Regulation:  Reviewed for med. necessity/level of care/duration of stay  If discussed at Long Length of Stay Meetings, dates discussed:    Comments:  926/13 15;32 Letha Cape RN, BSN [phone] spoke with patient's son, he states he will have his sister to call me in reference to choice about [facility] services. Patient has a sitter in the room, patient is confused, he has been pulling at foley catheter, pt with hematuria, urology seeing, patient will be transferred cards tele bed. Patient has been on xarelto , but fear to keep pt on this due to high fall risk, consulted ir for ivc filter.  12/04/11 15:33 Letha Cape RN, BSN [phone] patient lives with spouse, patient has 24 hr aide.  NCM will continue to follow for dc needs.

## 2011-12-07 NOTE — Progress Notes (Signed)
PATIENT DETAILS Name: Jason [name] Age: 78 y.o. Sex: male Date of Birth: 15-Feb-1920 Admit Date: 12/02/2011 Admitting Physician Catarina Hartshorn, MD ZOX:[id number] TOM, MD  Subjective: Seems more awake and alert today, pinkish/reddish tinge to the urine in the foley bag continues  Assessment/Plan: Active Problems: Herpes zoster dermatitis  -involving the left gluteal region and the left upper thigh -seems to be much better -continue with Valtrex   Cellulitis, left Buttock -better -c/w Vanco  Acute renal failure, on CKD stage II- III -resolved -2/2 obstruction -continues to have hematuria-spoke with Dr Jolene Schimke trial today-he will follow up as well. Advises to discontinue anticoagulation -c/w Flomax, add Proscar  Mild Hematuria -likely 2/2 to foley trauma -persistent now for 2 days, clearly with advanced age, delirium issues, unsteady gait-continues to be high risk for foley trauma-especially when patient on anticoagulation.  -Spoke with son at bedside, daughter on phone-various options discussed, at this time-we will stop heparin gtt, and try a voiding trial. Continues to be poor long term anticoagulation candidate given high fall risk and ongoing hematuria, family understands. See below for further discussion   Thrombocytopenia -resolved, check periodically  Coronary artery disease  -History of bare-metal stent, continue aspirin.  -History of recurrent DVT, legs  -Patient was on  Xarelto as outpatient -per family he apparently has had 2 unexplained VTE's-DVT and PE-and has been told that he needs life long anticoagulation. However with ongoing hematuria, and increased fall risk-risks of ongoing anticoagulation seems to outweigh any potential benefits. Difficult situation-long discussion with family-options including-stopping all anticoagulation-just keeping on ASA, stopping Anticoagulation-placing IVC filter and continuing anticoagulation discussed.For now-family ok  to stop anticoagulation in the short term, will decide if they will opt for IVC filter and let me know.Will just place on prophylactic lovenox-family agreeable to this.  Disposition: Remain inpatient  DVT Prophylaxis: Place on prophylactic Lovenox  Code Status: Full code  Procedures:  None  CONSULTS:  None  PHYSICAL EXAM: Vital signs in last 24 hours: Filed Vitals:   12/06/11 0534 12/06/11 2100 12/07/11 0450 12/07/11 0535  BP: 159/98 185/81  172/88  Pulse: 82 68  81  Temp: 97.7 F (36.5 C) 98.1 F (36.7 C)  98.4 F (36.9 C)  TempSrc: Oral Oral  Oral  Resp: 20 19  19   Height:      Weight: 85.8 kg (189 lb 2.5 oz)  85.7 kg (188 lb 15 oz)   SpO2: 94% 96%  94%    Weight change: -0.1 kg (-3.5 oz) Body mass index is 35.33 kg/(m^2).   Gen Exam: Awake and alert with clear speech.   Neck: Supple, No JVD.   Chest: B/L Clear.   CVS: S1 S2 Regular, no murmurs.  Abdomen: soft, BS +, non tender, non distended.  Extremities: no edema, lower extremities warm to touch. Neurologic: Non Focal.   Skin: still with some erythema in the left gluteal area, blisters not seen   Wounds: N/A.   Intake/Output from previous day:  Intake/Output Summary (Last 24 hours) at 12/07/11 1122 Last data filed at 12/07/11 0858  Gross per 24 hour  Intake 844.91 ml  Output   2350 ml  Net -1505.09 ml     LAB RESULTS: CBC  Lab 12/07/11 0507 12/06/11 0435 12/05/11 0615 12/04/11 0556 12/03/11 0750 12/02/11 1633  WBC 7.4 6.5 7.6 6.3 5.8 --  HGB 13.6 13.7 13.4 12.6* 13.1 --  HCT 38.7* 38.3* 38.5* 37.4* 37.8* --  PLT 155 114* 99* 92* 97* --  MCV 92.8 93.2  93.7 95.7 94.7 --  MCH 32.6 33.3 32.6 32.2 32.8 --  MCHC 35.1 35.8 34.8 33.7 34.7 --  RDW 13.9 13.8 14.0 14.3 14.0 --  LYMPHSABS -- -- -- -- -- 1.1  MONOABS -- -- -- -- -- 0.5  EOSABS -- -- -- -- -- 0.1  BASOSABS -- -- -- -- -- 0.0  BANDABS -- -- -- -- -- --    Chemistries   Lab 12/07/11 0507 12/06/11 0435 12/05/11 0615 12/04/11 0556  12/03/11 0750  NA 138 140 137 132* 132*  K 3.4* 4.0 4.0 4.6 3.6  CL 102 106 104 102 100  CO2 21 22 20  18* 21  GLUCOSE 81 82 87 90 93  BUN 10 12 21  32* 18  CREATININE 1.01 1.16 2.03* 4.36* 1.94*  CALCIUM 8.8 8.9 8.7 8.0* 7.9*  MG -- -- -- -- --    CBG: No results found for this basename: GLUCAP:5 in the last 168 hours  GFR Estimated Creatinine Clearance: 45.4 ml/min (by C-G formula based on Cr of 1.01).  Coagulation profile  Lab 12/02/11 2019  INR 1.47  PROTIME --    Cardiac Enzymes No results found for this basename: CK:3,CKMB:3,TROPONINI:3,MYOGLOBIN:3 in the last 168 hours  No components found with this basename: POCBNP:3 No results found for this basename: DDIMER:2 in the last 72 hours No results found for this basename: HGBA1C:2 in the last 72 hours No results found for this basename: CHOL:2,HDL:2,LDLCALC:2,TRIG:2,CHOLHDL:2,LDLDIRECT:2 in the last 72 hours No results found for this basename: TSH,T4TOTAL,FREET3,T3FREE,THYROIDAB in the last 72 hours No results found for this basename: VITAMINB12:2,FOLATE:2,FERRITIN:2,TIBC:2,IRON:2,RETICCTPCT:2 in the last 72 hours No results found for this basename: LIPASE:2,AMYLASE:2 in the last 72 hours  Urine Studies No results found for this basename: UACOL:2,UAPR:2,USPG:2,UPH:2,UTP:2,UGL:2,UKET:2,UBIL:2,UHGB:2,UNIT:2,UROB:2,ULEU:2,UEPI:2,UWBC:2,URBC:2,UBAC:2,CAST:2,CRYS:2,UCOM:2,BILUA:2 in the last 72 hours  MICROBIOLOGY: Recent Results (from the past 240 hour(s))  CULTURE, BLOOD (ROUTINE X 2)     Status: Normal (Preliminary result)   Collection Time   12/02/11  4:25 PM      Component Value Range Status Comment   Specimen Description BLOOD LEFT HAND   Final    Special Requests BOTTLES DRAWN AEROBIC ONLY 10CC   Final    Culture  Setup Time 12/03/2011 11:40   Final    Culture     Final    Value:        BLOOD CULTURE RECEIVED NO GROWTH TO DATE CULTURE WILL BE HELD FOR 5 DAYS BEFORE ISSUING A FINAL NEGATIVE REPORT   Report Status  PENDING   Incomplete   CULTURE, BLOOD (ROUTINE X 2)     Status: Normal (Preliminary result)   Collection Time   12/02/11  4:32 PM      Component Value Range Status Comment   Specimen Description BLOOD LEFT HAND   Final    Special Requests BOTTLES DRAWN AEROBIC ONLY   Final    Culture  Setup Time 12/03/2011 11:40   Final    Culture     Final    Value:        BLOOD CULTURE RECEIVED NO GROWTH TO DATE CULTURE WILL BE HELD FOR 5 DAYS BEFORE ISSUING A FINAL NEGATIVE REPORT   Report Status PENDING   Incomplete   URINE CULTURE     Status: Normal   Collection Time   12/02/11  5:19 PM      Component Value Range Status Comment   Specimen Description URINE, CLEAN CATCH   Final    Special Requests NONE   Final  Culture  Setup Time 12/03/2011 11:39   Final    Colony Count 85,000 COLONIES/ML   Final    Culture     Final    Value: Multiple bacterial morphotypes present, none predominant. Suggest appropriate recollection if clinically indicated.   Report Status 12/04/2011 FINAL   Final     RADIOLOGY STUDIES/RESULTS: Ct Abdomen Pelvis Wo Contrast  12/03/2011  *RADIOLOGY REPORT*  Clinical Data: Abdominal pain  CT ABDOMEN AND PELVIS WITHOUT CONTRAST  Technique:  Multidetector CT imaging of the abdomen and pelvis was performed following the standard protocol without intravenous contrast.  Comparison: 04/25/2011  Findings: Probable dependent atelectasis in the bilateral lung bases.  Small hiatal hernia.  Unenhanced liver, spleen, pancreas, and adrenal glands are within normal limits.  Gallbladder is unremarkable.  No intrahepatic or extrahepatic ductal dilatation.  At least two punctate nonobstructing right renal calculi. Bilateral renal sinus cysts.  Extrarenal pelves.  No hydronephrosis.  No evidence of bowel obstruction.  Normal appendix.  Atherosclerotic calcifications of the abdominal aorta and branch vessels.  No abdominopelvic ascites.  No suspicious abdominopelvic lymphadenopathy.  Prostate is  unremarkable.  No ureteral or bladder calculi.  Small fat-containing left inguinal hernia.  Degenerative changes of the visualized thoracolumbar spine.  IMPRESSION: No evidence of bowel obstruction.  Normal appendix.  Two punctate nonobstructing right renal calculi.  No ureteral or bladder calculi.  No hydronephrosis.  No CT findings to account for the patient's abdominal pain.   Original Report Authenticated By: Charline Bills, M.D.    US Renal  12/04/2011  *RADIOLOGY REPORT*  Clinical Data: Renal failure  RENAL/URINARY TRACT ULTRASOUND COMPLETE  Comparison:  CT abdomen pelvis dated 12/02/2011 and 04/25/2011  Findings:  Right Kidney:  Measures 11.1 cm.  Mild fullness of the renal collecting system with an extrarenal pelvis, unchanged from prior CTs.  Left Kidney:  Measures 11.2 cm.  Mild fullness of the renal collecting system with an extrarenal pelvis, unchanged from prior CTs.  Bladder:  Distended.  IMPRESSION: Mild fullness of the bilateral renal collecting systems with extrarenal pelves, unchanged from prior CTs.  Distended bladder.   Original Report Authenticated By: Charline Bills, M.D.    Dg Chest Port 1 View  12/02/2011  *RADIOLOGY REPORT*  Clinical Data: Fever, weakness, shortness of breath  PORTABLE CHEST - 1 VIEW  Comparison: 11/13/2010  Findings: Lungs are essentially clear.  No focal dilatation. No pleural effusion or pneumothorax.  The heart is normal in size.  IMPRESSION: No evidence of acute cardiopulmonary disease.   Original Report Authenticated By: Charline Bills, M.D.     MEDICATIONS: Scheduled Meds:    . aspirin EC  81 mg Oral Daily  . cholecalciferol  1,000 Units Oral Daily  . DULoxetine  30 mg Oral QHS  . finasteride  5 mg Oral Daily  . hydrALAZINE  5 mg Intravenous Once  . latanoprost  1 drop Both Eyes QHS  . pantoprazole  40 mg Oral Q1200  . Tamsulosin HCl  0.4 mg Oral Daily  . valACYclovir  1,000 mg Oral Daily  . vancomycin  1,000 mg Intravenous Q24H    Continuous Infusions:    . DISCONTD: sodium chloride Stopped (12/07/11 1030)  . DISCONTD: heparin 1,050 Units/hr (12/06/11 1720)   PRN Meds:.acetaminophen, acetaminophen, ALPRAZolam, hydrALAZINE, HYDROcodone-acetaminophen, morphine injection, ondansetron (ZOFRAN) IV, ondansetron  Antibiotics: Anti-infectives     Start     Dose/Rate Route Frequency Ordered Stop   12/06/11 2200   vancomycin (VANCOCIN) IVPB 1000 mg/200 mL premix  1,000 mg 200 mL/hr over 60 Minutes Intravenous Every 24 hours 12/06/11 0821     12/06/11 1000   valACYclovir (VALTREX) tablet 1,000 mg        1,000 mg Oral Daily 12/06/11 0931     12/05/11 2200   vancomycin (VANCOCIN) 750 mg in sodium chloride 0.9 % 150 mL IVPB  Status:  Discontinued        750 mg 150 mL/hr over 60 Minutes Intravenous Every 24 hours 12/05/11 2037 12/06/11 0821   12/05/11 1000   valACYclovir (VALTREX) tablet 500 mg  Status:  Discontinued        500 mg Oral Daily 12/04/11 1642 12/06/11 0931   12/03/11 1800   vancomycin (VANCOCIN) IVPB 1000 mg/200 mL premix  Status:  Discontinued        1,000 mg 200 mL/hr over 60 Minutes Intravenous Every 24 hours 12/02/11 2208 12/03/11 1005   12/03/11 1800   vancomycin (VANCOCIN) 750 mg in sodium chloride 0.9 % 150 mL IVPB  Status:  Discontinued        750 mg 150 mL/hr over 60 Minutes Intravenous Every 24 hours 12/03/11 1005 12/04/11 0946   12/03/11 1100   valACYclovir (VALTREX) tablet 1,000 mg  Status:  Discontinued        1,000 mg Oral Daily 12/03/11 1019 12/04/11 1642   12/02/11 2200   valACYclovir (VALTREX) tablet 1,000 mg  Status:  Discontinued        1,000 mg Oral 2 times daily 12/02/11 2149 12/03/11 1019   12/02/11 1845   vancomycin (VANCOCIN) IVPB 1000 mg/200 mL premix        1,000 mg 200 mL/hr over 60 Minutes Intravenous  Once 12/02/11 1837 12/02/11 Benay Pike, MD  [facility] Pager:[phone]  If 7PM-7AM, please contact  night-coverage [email or web address] Password [email or web address] 12/07/2011, 11:22 AM   LOS: 5 days

## 2011-12-07 NOTE — Consult Note (Signed)
CARDIOLOGY CONSULT NOTE  Patient ID: Jason [name] MRN: [id number], DOB/AGE: 76-Sep-1921   Admit date: 12/02/2011 Date of Consult: 12/07/2011   Primary Physician: Leo Grosser, MD Primary Cardiologist: Judie Petit. Excell Seltzer, MD  Pt. Profile  78 year old male with prior cardiac history. Consult received for A-fib RVR.   Problem List  Past Medical History  Diagnosis Date  . PVD (peripheral vascular disease)   . CAD (coronary artery disease)   . Hyperlipidemia   . Cerebrovascular accident   . DVT femoral (deep venous thrombosis) with thrombophlebitis   . GERD (gastroesophageal reflux disease)   . Edema   . Anxiety   . Vertigo   . IBS (irritable bowel syndrome)   . Duodenitis   . Gastritis   . Esophageal stricture   . Cellulitis   . Pulmonary embolism   . Prostatic hypertrophy   . Hiatal hernia   . Frequent falls   . Subdural hematoma   . PTSD (post-traumatic stress disorder)   . Vertigo     Past Surgical History  Procedure Date  . Hand surgery   . Angioplasty   . Carotid endarterectomy     Left  . Bare-metal stenting     left circumflex  . Bilateral cataract surgery     Allergies  Allergies  Allergen Reactions  . Amoxicillin-Pot Clavulanate Nausea And Vomiting    Unknown-daughter states he can tolerate Amoxicillin ok  . Fludrocortisone Acetate Other (See Comments)    unknown  . Scopace (Scopolamine) Other (See Comments)    *patch Unknown reaction   HPI   78 year old male with above problem list, which includes CAD, CVA, PE, and DVT. Patient initially presented to [facility] 9/21 with complaints of generalized weakness, fever, loose stools and abdominal pain. Pt had erythema on buttock that was found to be shingles. He was treated with vancomycin and valcyclovir. 9/23 renal function began to deteriorate on 9/23, rising to 4.3 (following foley removal) and xarelto, which he has been on since 06/2011 2/2 recurrent DVT, was d/c'd and heparin was started.  Foley was replaced  and with improved urine output, creat normalized suggesting post renal obstruction/prostatic hyperplasia as cause of renal dysfxn.  Following foley replacement, pt developed hematuria while on heparin and after discussion with family and urology, decision was made to d/c anticoagulation altogether and place an IVC filter - tentatively scheduled for tomorrow.    This AM, foley was removed again, however after he was unable to void, it was replaced this afternoon.  Prior to replacement, during a routine vital sign check he was found to be tachycardic and subsequent ecg showed afib.  He denies chest pain or sob.  He endorses some wkns and "swimmy-headed-ness" but notes that this is common for him.  He has no palpitations and does not believe he has ever experienced afib before.  After 2, 10mg  Dilt boluses, rates remain in the 130's-140's.  Inpatient Medications     . aspirin EC  81 mg Oral Daily  . cholecalciferol  1,000 Units Oral Daily  . diltiazem  20 mg Intravenous Once  . DULoxetine  30 mg Oral QHS  . enoxaparin (LOVENOX) injection  40 mg Subcutaneous Q24H  . finasteride  5 mg Oral Daily  . hydrALAZINE  5 mg Intravenous Once  . latanoprost  1 drop Both Eyes QHS  . pantoprazole  40 mg Oral Q1200  . Tamsulosin HCl  0.4 mg Oral Daily  . valACYclovir  1,000 mg Oral Daily  .  vancomycin  1,000 mg Intravenous Q24H  . white petrolatum        Family History Family History  Problem Relation Age of Onset  . Colon cancer Neg Hx   . Coronary artery disease Brother   . Heart attack Father      Social History History   Social History  . Marital Status: Married    Spouse Name: N/A    Number of Children: N/A  . Years of Education: N/A   Occupational History  . Not on file.   Social History Main Topics  . Smoking status: Never Smoker   . Smokeless tobacco: Not on file  . Alcohol Use: Yes     Wine occasional  . Drug Use: No  . Sexually Active: Not on file   Other Topics Concern  .  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  Cardiovascular:  No chest pain, dyspnea on exertion, edema, orthopnea, palpitations, paroxysmal nocturnal dyspnea. Dermatological: No rash, lesions/masses Respiratory: No cough, dyspnea Urologic: ++ hematuria, dysuria Abdominal:   No nausea, vomiting, diarrhea, bright red blood per rectum, melena, or hematemesis Neurologic: + wkns,  No visual changes, changes in mental status. All other systems reviewed and are otherwise negative except as noted above.  Physical Exam  Blood pressure 106/75, pulse 135, temperature 98 F (36.7 C), temperature source Oral, resp. rate 20, height 5' 1.32" (1.558 m), weight 188 lb 15 oz (85.7 kg), SpO2 97.00%.  General: Pleasant, NAD Psych: Normal affect. Neuro: Alert and oriented X 3. Moves all extremities spontaneously. HEENT: Normal  Neck: Supple without JVD.  Left bruit noted. Lungs:  Resp regular and unlabored, CTA. Heart: irregularly irregular. no s3, s4, or murmurs. Abdomen: Soft, non-tender, non-distended, BS + x 4.  Extremities: No clubbing, cyanosis or edema. DP/PT/Radials 1+ and equal bilaterally.  Labs  Lab Results  Component Value Date   WBC 7.4 12/07/2011   HGB 13.6 12/07/2011   HCT 38.7* 12/07/2011   MCV 92.8 12/07/2011   PLT 155 12/07/2011     Lab 12/07/11 0507 12/02/11 1633  NA 138 --  K 3.4* --  CL 102 --  CO2 21 --  BUN 10 --  CREATININE 1.01 --  CALCIUM 8.8 --  PROT -- 6.5  BILITOT -- 0.4  ALKPHOS -- 73  ALT -- 16  AST -- 23  GLUCOSE 81 --   ECG  Coarse Atrial fibrillation RVR, rate 133. T wave inversion in leads III and avF, ST depression V5, V6. RBBB.  ASSESSMENT AND PLAN  1. A-fib RVR: Patient is currently asymptomatic with HR 100- 140. Two diltiazem boluses have been ineffective in controlling rate.  With ongoing urinary issues  and recovery from recent illness, he will be prone to more/ongoing afib.  This being the case, we will add Amiodarone IV tonight in hopes of restoring sinus rhythm and avoiding a need for dccv.  If he converts on IV amio, we would plan to switch him to 400mg  po bid prior to d/c with further outpt weaning of dose.  His CHADS2 score = 2 (htn/age).  With ongoing hematuria, he is a poor anticoagulation candidate at this time, however we may reconsider this in the outpt setting once urinary issues have improved.  Agree with ASA.  2. HTN: BP became soft in setting of IV dilt boluses.  Will follow on Amio.  3. Recurrent DVT's:  For IVC filter tomorrow provided that he is hemodynamically stable.  Anticoagulation d/c'd in setting of hematuria @ this time.  4.  Hematuria:  As above, anti-coagulationon hold.  Urology involved.  5.  Hypokalemia: supp.  Check Mg.  Signed, Nicolasa Ducking, NP 12/07/2011, 5:24 PM   Patient seen and examined.  Agree with findings of C Berge. Patient is a 78 year old who has been noted to be in atrial fibrillation with RVR today.  Was not noted on previous vital signs. The patient is hemodynamically stable. On exam:  Lungs:  Rel clear.  Cardiac  Irreg Irreg.  No S3.  Neck:  JVP is normal  L bruit.  Ext:  No edema. EKG:  AFib with RVR.  RBBB.  Nonspecific ST T wave  changes.  Would recomm amiodarone IV.   As of now he his not a good a candidate for anticoagulation.  Will need to be reevaluated as an outpatient. Follow electrolytes.  Kadien Manger 9:58 PM 12/07/2011

## 2011-12-07 NOTE — Progress Notes (Signed)
Clinical Social Worker reviewed chart and noted PT recommendation that pt able to return home with home health services as pt and pt wife have 24/7 caregivers at home. RNCM aware and assisting with home health needs. No further social work needs identified at this time. Clinical Social Worker signing off. Please re-consult if further social work needs arise.  Jacklynn Lewis, MSW, LCSWA  Clinical Social Work [phone]

## 2011-12-07 NOTE — Progress Notes (Signed)
Physical Therapy Treatment Patient Details Name: Jason [name] MRN: [id number] DOB: 08/04/1919 Today's Date: 12/07/2011 Time: 1330-1400 PT Time Calculation (min): 30 min  PT Assessment / Plan / Recommendation Comments on Treatment Session  Pt. able to tolerate ambulating into bathroom for BM then to sink for washing of hands and brushing of teeth with min guard assist.  Became fatigued as he was approaching bed and sat down before he was close to the  head of the bed.  Vitals taken upon return to supine in bed.  See vitals tab, but  essentially BP on the low side, HR elevated (unsure if he did val salva maneuver while defacating).  O2 sats stable on room air.  RN in room and checking pt. as PT  leaving room.    Follow Up Recommendations  Home health PT;Supervision/Assistance - 24 hour    Barriers to Discharge        Equipment Recommendations  Other (comment) (bed rail)    Recommendations for Other Services OT consult  Frequency Min 3X/week   Plan Discharge plan remains appropriate    Precautions / Restrictions Precautions Precautions: Fall Precaution Comments: Airborne precautions from shingles Restrictions Weight Bearing Restrictions: No   Pertinent Vitals/Pain See vitals tab for vitals; pt. With elevated HR and somewhat low BP at end of session.  O2 sats stable on room air.  Denies pain.    Mobility  Bed Mobility Bed Mobility: Supine to Sit Supine to Sit: 5: Supervision;HOB elevated Details for Bed Mobility Assistance: effeciency cues Transfers Transfers: Sit to Stand;Stand to Sit Sit to Stand: 4: Min assist;From elevated surface;With upper extremity assist;From bed;From toilet Stand to Sit: 4: Min guard;With upper extremity assist;To toilet;To bed Details for Transfer Assistance: vc for safety Ambulation/Gait Ambulation/Gait Assistance: 4: Min assist Ambulation Distance (Feet): 20 Feet Assistive device: Rolling walker Ambulation/Gait Assistance Details: needed manual  assist for stability and for keeping Rw close to him Gait Pattern: Step-through pattern;Trunk flexed General Gait Details: shuffled    Exercises     PT Diagnosis:    PT Problem List:   PT Treatment Interventions:     PT Goals Acute Rehab PT Goals PT Goal: Sit to Stand - Progress: Progressing toward goal PT Goal: Stand to Sit - Progress: Progressing toward goal PT Goal: Ambulate - Progress: Progressing toward goal  Visit Information  Last PT Received On: 12/07/11 Assistance Needed: +1    Subjective Data  Subjective: "I feel so weak"   Cognition  Overall Cognitive Status: History of cognitive impairments - at baseline Area of Impairment: Memory;Problem solving Arousal/Alertness: Awake/alert Orientation Level: Disoriented to;Situation Behavior During Session: WFL for tasks performed Memory: Decreased recall of precautions Cognition - Other Comments: Very pleasant but slightly confused, difficulty following commands sometimes but likely because he is hard of hearing    Balance     End of Session PT - End of Session Equipment Utilized During Treatment: Gait belt Activity Tolerance: Patient limited by fatigue;Patient tolerated treatment well Patient left: in bed;with nursing in room;with call bell/phone within reach Nurse Communication: Mobility status   GP     Ferman Hamming 12/07/2011, 2:13 PM Weldon Picking PT Acute Rehab Services [phone] [place] [phone]

## 2011-12-07 NOTE — Progress Notes (Signed)
Patient noted to be in Afib with RVR-this is new diagnoses for patient, pushed IV Cardizem 20 mg bolus, no response. Patient is assymptomatic, will move to cardiac telemetry for cardizem gtt, have consulted  cards as well.Continues to be a poor candidate for anticoagulation-given high fall risk and ongoing hematuria. Will maintain on ASA. -Also still has not voided yet-foley cath removed around 10:30, last residual 400 cc, awaiting Urology input as well. -IVC Filter placement tomorrow -Updated daughter at bedside

## 2011-12-07 NOTE — Progress Notes (Signed)
Foley cath d/c per MD order. Tolerated well. Will monitor for urine.

## 2011-12-07 NOTE — Consult Note (Signed)
Reason for Consult:Urinary Retention, Hematuria Referring Physician: Hospitalist  ASH MCELWAIN is an 78 y.o. male.  HPI:   1 - Urinary Retention / Acute Renal Failure - Pt found on w/u of acute renal failure this admission to have mild bilateral hydro and very distended bladder. Cr baseline 1.22 06/2011, 1.1 on admits, peaked to 4.03 4/25, now back  To baseline 1.01 4/26 after foley.  He has had retention before and follows with Dr. Retta Diones in our practice at [facility] and is managed on daily Flomax. He also has chronic constipation and he has had retention brought on by impaction as well previously. He also has problems with neuropathy of uncertain origin.  He failed another trial of void today, not having meaningful sensation to void even at 800cc in bladder.  2 - Hematuria - Pt with bloody urine after placement of foley this admission. He is on Xarelto at baseline for PE and on heparin gtt at present. No catheter clotting. He does get confused at times and has pulled at cathter    Past Medical History  Diagnosis Date  . PVD (peripheral vascular disease)   . CAD (coronary artery disease)   . Hyperlipidemia   . Cerebrovascular accident   . DVT femoral (deep venous thrombosis) with thrombophlebitis   . GERD (gastroesophageal reflux disease)   . Edema   . Anxiety   . Vertigo   . IBS (irritable bowel syndrome)   . Duodenitis   . Gastritis   . Esophageal stricture   . Cellulitis   . Pulmonary embolism   . Prostatic hypertrophy   . Hiatal hernia   . Frequent falls   . Subdural hematoma   . PTSD (post-traumatic stress disorder)   . Vertigo     Past Surgical History  Procedure Date  . Hand surgery   . Angioplasty   . Carotid endarterectomy     Left  . Bare-metal stenting     left circumflex  . Bilateral cataract surgery     Family History  Problem Relation Age of Onset  . Colon cancer Neg Hx   . Coronary artery disease Brother   . Heart attack Father     Social  History:  reports that he has never smoked. He does not have any smokeless tobacco history on file. He reports that he drinks alcohol. He reports that he does not use illicit drugs.  Allergies:  Allergies  Allergen Reactions  . Amoxicillin-Pot Clavulanate Nausea And Vomiting    Unknown-daughter states he can tolerate Amoxicillin ok  . Fludrocortisone Acetate Other (See Comments)    unknown  . Scopace (Scopolamine) Other (See Comments)    *patch Unknown reaction    Medications: I have reviewed the patient's current medications.  Results for orders placed during the hospital encounter of 12/02/11 (from the past 48 hour(s))  APTT     Status: Abnormal   Collection Time   12/05/11  2:05 PM      Component Value Range Comment   aPTT 56 (*) 24 - 37 seconds   HEPARIN LEVEL (UNFRACTIONATED)     Status: Normal   Collection Time   12/05/11  2:05 PM      Component Value Range Comment   Heparin Unfractionated 0.36  0.30 - 0.70 IU/mL   VANCOMYCIN, TROUGH     Status: Abnormal   Collection Time   12/05/11  6:36 PM      Component Value Range Comment   Vancomycin Tr 5.1 (*) 10.0 -  20.0 ug/mL   CBC     Status: Abnormal   Collection Time   12/06/11  4:35 AM      Component Value Range Comment   WBC 6.5  4.0 - 10.5 K/uL    RBC 4.11 (*) 4.22 - 5.81 MIL/uL    Hemoglobin 13.7  13.0 - 17.0 g/dL    HCT 45.4 (*) 09.8 - 52.0 %    MCV 93.2  78.0 - 100.0 fL    MCH 33.3  26.0 - 34.0 pg    MCHC 35.8  30.0 - 36.0 g/dL    RDW 11.9  14.7 - 82.9 %    Platelets 114 (*) 150 - 400 K/uL CONSISTENT WITH PREVIOUS RESULT  HEPARIN LEVEL (UNFRACTIONATED)     Status: Normal   Collection Time   12/06/11  4:35 AM      Component Value Range Comment   Heparin Unfractionated 0.44  0.30 - 0.70 IU/mL   BASIC METABOLIC PANEL     Status: Abnormal   Collection Time   12/06/11  4:35 AM      Component Value Range Comment   Sodium 140  135 - 145 mEq/L    Potassium 4.0  3.5 - 5.1 mEq/L    Chloride 106  96 - 112 mEq/L    CO2 22   19 - 32 mEq/L    Glucose, Bld 82  70 - 99 mg/dL    BUN 12  6 - 23 mg/dL    Creatinine, Ser 5.62  0.50 - 1.35 mg/dL DELTA CHECK NOTED   Calcium 8.9  8.4 - 10.5 mg/dL    GFR calc non Af Amer 54 (*) >90 mL/min    GFR calc Af Amer 62 (*) >90 mL/min   APTT     Status: Abnormal   Collection Time   12/06/11  4:35 AM      Component Value Range Comment   aPTT 60 (*) 24 - 37 seconds   CBC     Status: Abnormal   Collection Time   12/07/11  5:07 AM      Component Value Range Comment   WBC 7.4  4.0 - 10.5 K/uL    RBC 4.17 (*) 4.22 - 5.81 MIL/uL    Hemoglobin 13.6  13.0 - 17.0 g/dL    HCT 13.0 (*) 86.5 - 52.0 %    MCV 92.8  78.0 - 100.0 fL    MCH 32.6  26.0 - 34.0 pg    MCHC 35.1  30.0 - 36.0 g/dL    RDW 78.4  69.6 - 29.5 %    Platelets 155  150 - 400 K/uL DELTA CHECK NOTED  HEPARIN LEVEL (UNFRACTIONATED)     Status: Normal   Collection Time   12/07/11  5:07 AM      Component Value Range Comment   Heparin Unfractionated 0.37  0.30 - 0.70 IU/mL   BASIC METABOLIC PANEL     Status: Abnormal   Collection Time   12/07/11  5:07 AM      Component Value Range Comment   Sodium 138  135 - 145 mEq/L    Potassium 3.4 (*) 3.5 - 5.1 mEq/L    Chloride 102  96 - 112 mEq/L    CO2 21  19 - 32 mEq/L    Glucose, Bld 81  70 - 99 mg/dL    BUN 10  6 - 23 mg/dL    Creatinine, Ser 2.84  0.50 - 1.35 mg/dL    Calcium  8.8  8.4 - 10.5 mg/dL    GFR calc non Af Amer 63 (*) >90 mL/min    GFR calc Af Amer 73 (*) >90 mL/min     No results found.  Review of Systems  Constitutional: Negative.   HENT: Negative.   Eyes: Negative.   Respiratory: Negative.   Cardiovascular: Negative.   Gastrointestinal: Negative.   Genitourinary: Positive for urgency, frequency and hematuria. Negative for flank pain.  Musculoskeletal: Negative.   Skin: Negative.   Neurological: Negative.   Endo/Heme/Allergies: Negative.   Psychiatric/Behavioral: Negative.    Blood pressure 172/88, pulse 81, temperature 98.4 F (36.9 C),  temperature source Oral, resp. rate 19, height 5' 1.32" (1.558 m), weight 85.7 kg (188 lb 15 oz), SpO2 94.00%. Physical Exam  Constitutional: He appears well-developed.       Frail, elderly. Daughter at bedside.  HENT:  Head: Normocephalic and atraumatic.  Eyes: Pupils are equal, round, and reactive to light.  Neck: Normal range of motion. Neck supple.  Cardiovascular: Normal rate.   Respiratory: Effort normal.  GI: Soft. Bowel sounds are normal.  Genitourinary:       Large prostate 60gm by DRE  Musculoskeletal: Normal range of motion.  Neurological: He is alert.       Some stigmata of cognitive decline  Skin: Skin is warm and dry.  Psychiatric: He has a normal mood and affect. His behavior is normal. Judgment and thought content normal.    Assessment/Plan: 1 - Urinary Retention / Acute Renal Failure - Agreee likley related to outlet obstruction combined with possible bladder stretch injury.  Continue flomax, and start Finasteride, foley to remain for immediate furture, as the finasteride several weeks to have effect.  Will plan for trial of void in our office.  I personally replaced his catheter at the bedside with immediate efflux 800cc light pink urine. Family and nursing have been informed to place patient in pants and run tubing down the pants leg if he gets confused and pulls at it.  2 - Hematuria - likely from prostatic fossa in setting of foley that has been tugged on and on heparain gtt. Will defer aggressive w/u at this point.   Devanta Daniel 12/07/2011, 9:19 AM

## 2011-12-07 NOTE — Progress Notes (Addendum)
Bladder scan 403 ml. Pt states still feels need to urinate, attempting urinal.

## 2011-12-07 NOTE — Progress Notes (Signed)
Asked to assist with patient with new onset atrial fibrillation needing IV Cardizem push.  On arrival patient awake and alert.  Denies SOB or chest pain.  No report of palpitation.  12 lead shows atrial fib rate 110-120.  BP 155/100 left arm.  Dr. Jerral Ralph present.  Pushed 10 mg IV Cardizem per order.  HR 123 prior to push.  BP 155/100 RR 16 O2 sats 93% on RA.  PB 122/75 after push - HR 115.  Second 10 mg IV push given per order.  BP 107/69 HR 107.  HR back to 115 - BP 128/78 O2 sats 93%.  Patient continues to be asx - Dr. Jerral Ralph to transfer patient to cardiac telemetry floor for IV Cardizem drip.  Patient remains stable.  Call as needed.

## 2011-12-07 NOTE — Progress Notes (Signed)
Bladder scan done 328 ml shown, pt states he feels the need to urinate, attempting urinal.

## 2011-12-07 NOTE — H&P (Signed)
Chief Complaint: Hx DVT, fall risk Referring Physician:Ghimire HPI: Jason [name] is an 78 y.o. male with prior hx of unexplained DVT/PE who has been on Xeralto and told he needed lifelong anticoagulation. However, he has been admitted with a bad case of shingles which has debilatated him and he has become deconditioned. He is now a much higher fall risk and they fear placing him back on anticoagulation. Therefore, they have requested IR to place IVC filter to reduce risk of PE should he develop recurrent DVT off anticoagulation. PMHx reviewed as below.  Past Medical History:  Past Medical History  Diagnosis Date  . PVD (peripheral vascular disease)   . CAD (coronary artery disease)   . Hyperlipidemia   . Cerebrovascular accident   . DVT femoral (deep venous thrombosis) with thrombophlebitis   . GERD (gastroesophageal reflux disease)   . Edema   . Anxiety   . Vertigo   . IBS (irritable bowel syndrome)   . Duodenitis   . Gastritis   . Esophageal stricture   . Cellulitis   . Pulmonary embolism   . Prostatic hypertrophy   . Hiatal hernia   . Frequent falls   . Subdural hematoma   . PTSD (post-traumatic stress disorder)   . Vertigo     Past Surgical History:  Past Surgical History  Procedure Date  . Hand surgery   . Angioplasty   . Carotid endarterectomy     Left  . Bare-metal stenting     left circumflex  . Bilateral cataract surgery     Family History:  Family History  Problem Relation Age of Onset  . Colon cancer Neg Hx   . Coronary artery disease Brother   . Heart attack Father     Social History:  reports that he has never smoked. He does not have any smokeless tobacco history on file. He reports that he drinks alcohol. He reports that he does not use illicit drugs.  Allergies:  Allergies  Allergen Reactions  . Amoxicillin-Pot Clavulanate Nausea And Vomiting    Unknown-daughter states he can tolerate Amoxicillin ok  . Fludrocortisone Acetate Other (See  Comments)    unknown  . Scopace (Scopolamine) Other (See Comments)    *patch Unknown reaction    Medications: Medications Prior to Admission  Medication Sig Dispense Refill  . ALPRAZolam (XANAX) 0.25 MG tablet Take 0.25 mg by mouth at bedtime as needed. anxiety      . aspirin 81 MG tablet Take 81 mg by mouth daily.        . Cholecalciferol (VITAMIN D3) 1000 UNITS CAPS Take 1 capsule by mouth daily.        Tery Sanfilippo Calcium (STOOL SOFTENER PO) Take 1 capsule by mouth at bedtime.      . DULoxetine (CYMBALTA) 30 MG capsule Take 30 mg by mouth at bedtime.      . gabapentin (NEURONTIN) 300 MG capsule Take 300 mg by mouth 2 (two) times daily.      [name] HYDROcodone-acetaminophen (NORCO) 5-325 MG per tablet Take 1 tablet by mouth every 6 (six) hours as needed. Pain       . meclizine (ANTIVERT) 25 MG tablet Take 12.5 mg by mouth as needed. dizziness      . nitroGLYCERIN (NITROSTAT) 0.4 MG SL tablet Place 0.4 mg under the tongue every 5 (five) minutes as needed. Chest pain      . omeprazole (PRILOSEC) 20 MG capsule Take 20 mg by mouth 2 (two) times daily before  a meal.       . polyethylene glycol powder (MIRALAX) powder Take 17 g by mouth at bedtime. Constipation       . promethazine (PHENERGAN) 25 MG tablet Take 25 mg by mouth every 6 (six) hours as needed. nausea      . rivaroxaban (XARELTO) 10 MG TABS tablet Take 5 mg by mouth every evening.      . Tamsulosin HCl (FLOMAX) 0.4 MG CAPS Take 0.4 mg by mouth daily.        [name] zolpidem (AMBIEN) 10 MG tablet Take 5 mg by mouth at bedtime as needed. sleep        Please HPI for pertinent positives, otherwise complete 10 system ROS negative.  Physical Exam: Blood pressure 106/75, pulse 135, temperature 98 F (36.7 C), temperature source Oral, resp. rate 20, height 5' 1.32" (1.558 m), weight 188 lb 15 oz (85.7 kg), SpO2 97.00%. Body mass index is 35.33 kg/(m^2).   General Appearance:  Alert, cooperative, no distress, appears stated age  Head:   Normocephalic, without obvious abnormality, atraumatic  ENT: Unremarkable  Neck: Supple, symmetrical, trachea midline, no adenopathy, thyroid: not enlarged, symmetric, no tenderness/mass/nodules. (R)CEA scar well healed  Lungs:   Clear to auscultation bilaterally, no w/r/r, respirations unlabored without use of accessory muscles.  Heart:  Regular rate and rhythm, S1, S2 normal, no murmur, rub or gallop. Carotids 2+ without bruit.  Abdomen:   Soft, non-tender, non distended. Bowel sounds active all four quadrants,  no masses, no organomegaly.  Extremities: Extremities normal, atraumatic, no cyanosis or edema  Neurologic: Normal affect, no gross deficits.   Results for orders placed during the hospital encounter of 12/02/11 (from the past 48 hour(s))  VANCOMYCIN, TROUGH     Status: Abnormal   Collection Time   12/05/11  6:36 PM      Component Value Range Comment   Vancomycin Tr 5.1 (*) 10.0 - 20.0 ug/mL   CBC     Status: Abnormal   Collection Time   12/06/11  4:35 AM      Component Value Range Comment   WBC 6.5  4.0 - 10.5 K/uL    RBC 4.11 (*) 4.22 - 5.81 MIL/uL    Hemoglobin 13.7  13.0 - 17.0 g/dL    HCT 16.1 (*) 09.6 - 52.0 %    MCV 93.2  78.0 - 100.0 fL    MCH 33.3  26.0 - 34.0 pg    MCHC 35.8  30.0 - 36.0 g/dL    RDW 04.5  40.9 - 81.1 %    Platelets 114 (*) 150 - 400 K/uL CONSISTENT WITH PREVIOUS RESULT  HEPARIN LEVEL (UNFRACTIONATED)     Status: Normal   Collection Time   12/06/11  4:35 AM      Component Value Range Comment   Heparin Unfractionated 0.44  0.30 - 0.70 IU/mL   BASIC METABOLIC PANEL     Status: Abnormal   Collection Time   12/06/11  4:35 AM      Component Value Range Comment   Sodium 140  135 - 145 mEq/L    Potassium 4.0  3.5 - 5.1 mEq/L    Chloride 106  96 - 112 mEq/L    CO2 22  19 - 32 mEq/L    Glucose, Bld 82  70 - 99 mg/dL    BUN 12  6 - 23 mg/dL    Creatinine, Ser 9.14  0.50 - 1.35 mg/dL DELTA CHECK NOTED   Calcium 8.9  8.4 - 10.5 mg/dL    GFR calc non Af  Amer 54 (*) >90 mL/min    GFR calc Af Amer 62 (*) >90 mL/min   APTT     Status: Abnormal   Collection Time   12/06/11  4:35 AM      Component Value Range Comment   aPTT 60 (*) 24 - 37 seconds   CBC     Status: Abnormal   Collection Time   12/07/11  5:07 AM      Component Value Range Comment   WBC 7.4  4.0 - 10.5 K/uL    RBC 4.17 (*) 4.22 - 5.81 MIL/uL    Hemoglobin 13.6  13.0 - 17.0 g/dL    HCT 16.1 (*) 09.6 - 52.0 %    MCV 92.8  78.0 - 100.0 fL    MCH 32.6  26.0 - 34.0 pg    MCHC 35.1  30.0 - 36.0 g/dL    RDW 04.5  40.9 - 81.1 %    Platelets 155  150 - 400 K/uL DELTA CHECK NOTED  HEPARIN LEVEL (UNFRACTIONATED)     Status: Normal   Collection Time   12/07/11  5:07 AM      Component Value Range Comment   Heparin Unfractionated 0.37  0.30 - 0.70 IU/mL   BASIC METABOLIC PANEL     Status: Abnormal   Collection Time   12/07/11  5:07 AM      Component Value Range Comment   Sodium 138  135 - 145 mEq/L    Potassium 3.4 (*) 3.5 - 5.1 mEq/L    Chloride 102  96 - 112 mEq/L    CO2 21  19 - 32 mEq/L    Glucose, Bld 81  70 - 99 mg/dL    BUN 10  6 - 23 mg/dL    Creatinine, Ser 9.14  0.50 - 1.35 mg/dL    Calcium 8.8  8.4 - 78.2 mg/dL    GFR calc non Af Amer 63 (*) >90 mL/min    GFR calc Af Amer 73 (*) >90 mL/min    No results found.  Assessment/Plan Hx DVT, high fall risk with anticoagulation contraindicated. Labs ok, Creat 1.0 Discussed indications for IVC filter pt and daughter. Explained procedure of permanent IVC filter placement. Explained risks and complications. Consent obtained and signed in chart. Plan for tomorrow. Hep to be stopped 2 hrs prior to procedure.  Brayton El PA-C 12/07/2011, 2:22 PM

## 2011-12-08 ENCOUNTER — Inpatient Hospital Stay (HOSPITAL_COMMUNITY): Payer: Medicare Other

## 2011-12-08 DIAGNOSIS — I2510 Atherosclerotic heart disease of native coronary artery without angina pectoris: Secondary | ICD-10-CM

## 2011-12-08 DIAGNOSIS — I4891 Unspecified atrial fibrillation: Secondary | ICD-10-CM

## 2011-12-08 DIAGNOSIS — I359 Nonrheumatic aortic valve disorder, unspecified: Secondary | ICD-10-CM

## 2011-12-08 LAB — CBC
HCT: 38.3 % — ABNORMAL LOW (ref 39.0–52.0)
Hemoglobin: 13.3 g/dL (ref 13.0–17.0)
MCV: 93 fL (ref 78.0–100.0)
RBC: 4.12 MIL/uL — ABNORMAL LOW (ref 4.22–5.81)
WBC: 7.7 10*3/uL (ref 4.0–10.5)

## 2011-12-08 LAB — BASIC METABOLIC PANEL
CO2: 25 mEq/L (ref 19–32)
Chloride: 103 mEq/L (ref 96–112)
Creatinine, Ser: 1.17 mg/dL (ref 0.50–1.35)
Glucose, Bld: 101 mg/dL — ABNORMAL HIGH (ref 70–99)
Sodium: 137 mEq/L (ref 135–145)

## 2011-12-08 LAB — TSH: TSH: 5.754 u[IU]/mL — ABNORMAL HIGH (ref 0.350–4.500)

## 2011-12-08 LAB — MAGNESIUM: Magnesium: 1.9 mg/dL (ref 1.5–2.5)

## 2011-12-08 MED ORDER — ENOXAPARIN SODIUM 40 MG/0.4ML ~~LOC~~ SOLN
40.0000 mg | SUBCUTANEOUS | Status: DC
Start: 2011-12-08 — End: 2011-12-09
  Administered 2011-12-08: 40 mg via SUBCUTANEOUS
  Filled 2011-12-08 (×2): qty 0.4

## 2011-12-08 MED ORDER — MIDAZOLAM HCL 5 MG/5ML IJ SOLN
INTRAMUSCULAR | Status: AC | PRN
  Administered 2011-12-08: 1 mg via INTRAVENOUS

## 2011-12-08 MED ORDER — FENTANYL CITRATE 0.05 MG/ML IJ SOLN
INTRAMUSCULAR | Status: AC | PRN
  Administered 2011-12-08: 50 ug via INTRAVENOUS

## 2011-12-08 MED ORDER — IOHEXOL 300 MG/ML  SOLN
100.0000 mL | Freq: Once | INTRAMUSCULAR | Status: AC | PRN
  Administered 2011-12-08: 40 mL via INTRAVENOUS

## 2011-12-08 MED ORDER — AMIODARONE HCL 200 MG PO TABS
400.0000 mg | ORAL_TABLET | Freq: Two times a day (BID) | ORAL | Status: DC
  Administered 2011-12-08 – 2011-12-09 (×3): 400 mg via ORAL
  Filled 2011-12-08 (×4): qty 2

## 2011-12-08 NOTE — Progress Notes (Signed)
12/08/11 1415 Tera Mater, RN, BSN NCM [phone] In to speak with pt. about [facility] services, as well as giving list to dtr, Junie Panning.  Pt. and dtr. decided on [facility]. TC to Hilda Lias, with [facility], to give referral for [facility] RN, HH PT, and HH CSW.  Pt. may dc today home.

## 2011-12-08 NOTE — Progress Notes (Addendum)
    Subjective:  No chest pain, palps, or dyspnea.   Objective:  Vital Signs in the last 24 hours: Temp:  [97.1 F (36.2 C)-98.5 F (36.9 C)] 98.3 F (36.8 C) (09/27 0444) Pulse Rate:  [68-135] 68  (09/27 0444) Resp:  [18-20] 18  (09/27 0444) BP: (106-165)/(67-84) 165/84 mmHg (09/27 0444) SpO2:  [94 %-97 %] 96 % (09/27 0444) Weight:  [78.4 kg (172 lb 13.5 oz)-78.7 kg (173 lb 8 oz)] 78.4 kg (172 lb 13.5 oz) (09/27 0444)  Intake/Output from previous day: 09/26 0701 - 09/27 0700 In: 1202.2 [P.O.:50; I.V.:652.2; IV Piggyback:200] Out: 1050 [Urine:1050]  Physical Exam: Pt is alert and oriented, elderly gentleman, in NAD HEENT: normal Neck: JVP - normal Lungs: CTA bilaterally CV: RRR without murmur or gallop Abd: soft, NT, Positive BS, no hepatomegaly Ext: no C/C/E, distal pulses intact and equal Skin: mild stasis changes bilaterally   Lab Results:  Basename 12/08/11 0616 12/07/11 0507  WBC 7.7 7.4  HGB 13.3 13.6  PLT 175 155    Basename 12/08/11 0616 12/07/11 0507  NA 137 138  K 4.2 3.4*  CL 103 102  CO2 25 21  GLUCOSE 101* 81  BUN 10 10  CREATININE 1.17 1.01   No results found for this basename: TROPONINI:2,CK,MB:2 in the last 72 hours  Tele: atrial fibrillation converted to sinus rhythm.  Assessment/Plan:  1. Paroxysmal atrial fibrillation. Now in sinus rhythm on amiodarone. Will convert to PO amiodarone 400 mg BID x 2 weeks, then 200 mg daily. Not currently a candidate for anticoagulation. Otherwise continue ASA 81 mg daily.  2. Recurrent DVT. IVC filter to be placed today.  3. CAD, native vessel. No ischemic symptoms.   For follow-up, will need amio surveillance labs. Baseline LFT's are normal. TSH has been drawn and result is pending.  Will arrange outpatient visit for hospital follow-up.  Tonny Bollman, M.D. 12/08/2011, 9:13 AM

## 2011-12-08 NOTE — Progress Notes (Signed)
Echocardiogram 2D Echocardiogram has been performed.  Jason [name], Jason [name] 12/08/2011, 12:26 PM

## 2011-12-08 NOTE — Progress Notes (Signed)
Pt. Converted to NSR in the 70s.  Cordelia Pen, PA and Dr. Adolm Joseph made aware.  Pt. Has no complaints of pain.  VSS.  Will continue to monitor patient.

## 2011-12-08 NOTE — Progress Notes (Signed)
PATIENT DETAILS Name: Jason [name] Age: 78 y.o. Sex: male Date of Birth: 03-Dec-1919 Admit Date: 12/02/2011 Admitting Physician Catarina Hartshorn, MD ZOX:[id number] TOM, MD  Subjective: Awake and alert today, urine no longer red/pinkish  Assessment/Plan: Active Problems: Herpes zoster dermatitis  -involving the left gluteal region and the left upper thigh -seems to be much better -continue with Valtrex   Cellulitis, left Buttock -resolved -d/c Vanco  PAF-with RVR -back to sinus -not a anticoagulation candidate currently (see below), on ASA. Have d/w family-son/daughter in detail. -will need to reassess-in the outpatient setting-whether it is worth re-initiating anticoagulation at some point.  Acute renal failure, on CKD stage II- III -resolved -2/2 obstruction-failed voiding trial on 9/26, foley re-inserted same day.  -c/w Flomax, add Proscar -voiding trial now as outpatient  Mild Hematuria -likely 2/2 to foley trauma -persistent now for 2 days, clearly with advanced age, delirium issues, unsteady gait-continues to be high risk for foley trauma-especially if patient on anticoagulation. Thankfully seems to have resolved today-9/27. -Also off heparin gtt now   Thrombocytopenia -resolved, check periodically  Coronary artery disease  -History of bare-metal stent, continue aspirin.  -History of recurrent DVT, legs  -Patient was on  Xarelto as outpatient -per family he apparently has had 2 unexplained VTE's-DVT and PE-and has been told that he needs life long anticoagulation. However with ongoing hematuria, and increased fall risk-risks of ongoing anticoagulation seems to outweigh any potential benefits. Difficult situation-long discussion with family-options including-stopping all anticoagulation-just keeping on ASA, stopping Anticoagulation-placing IVC filter and continuing anticoagulation discussed.Family ok to stop anticoagulation in the short term,and finally opted for a IVC  filter placement -Will just place on prophylactic lovenox-family agreeable to this.  Disposition: Remain inpatient-home 9/28  DVT Prophylaxis: Place on prophylactic Lovenox  Code Status: Full code  Procedures:  None  CONSULTS:  None  PHYSICAL EXAM: Vital signs in last 24 hours: Filed Vitals:   12/08/11 1020 12/08/11 1032 12/08/11 1035 12/08/11 1040  BP: 171/84 181/88 153/83 161/79  Pulse: 69 67 66 66  Temp:      TempSrc:      Resp:  20 23 24   Height:      Weight:      SpO2: 100% 100% 100% 100%    Weight change: -7 kg (-15 lb 6.9 oz) Body mass index is 24.11 kg/(m^2).   Gen Exam: Awake and alert with clear speech.   Neck: Supple, No JVD.   Chest: B/L Clear.   CVS: S1 S2 Regular, no murmurs.  Abdomen: soft, BS +, non tender, non distended.  Extremities: no edema, lower extremities warm to touch. Neurologic: Non Focal.   Skin: no erythema in the left gluteal area, blisters not seen   Wounds: N/A.   Intake/Output from previous day:  Intake/Output Summary (Last 24 hours) at 12/08/11 1158 Last data filed at 12/08/11 0852  Gross per 24 hour  Intake 1152.15 ml  Output    650 ml  Net 502.15 ml     LAB RESULTS: CBC  Lab 12/08/11 0616 12/07/11 0507 12/06/11 0435 12/05/11 0615 12/04/11 0556 12/02/11 1633  WBC 7.7 7.4 6.5 7.6 6.3 --  HGB 13.3 13.6 13.7 13.4 12.6* --  HCT 38.3* 38.7* 38.3* 38.5* 37.4* --  PLT 175 155 114* 99* 92* --  MCV 93.0 92.8 93.2 93.7 95.7 --  MCH 32.3 32.6 33.3 32.6 32.2 --  MCHC 34.7 35.1 35.8 34.8 33.7 --  RDW 14.0 13.9 13.8 14.0 14.3 --  LYMPHSABS -- -- -- -- --  1.1  MONOABS -- -- -- -- -- 0.5  EOSABS -- -- -- -- -- 0.1  BASOSABS -- -- -- -- -- 0.0  BANDABS -- -- -- -- -- --    Chemistries   Lab 12/08/11 0616 12/07/11 0507 12/06/11 0435 12/05/11 0615 12/04/11 0556  NA 137 138 140 137 132*  K 4.2 3.4* 4.0 4.0 4.6  CL 103 102 106 104 102  CO2 25 21 22 20  18*  GLUCOSE 101* 81 82 87 90  BUN 10 10 12 21  32*  CREATININE 1.17  1.01 1.16 2.03* 4.36*  CALCIUM 9.0 8.8 8.9 8.7 8.0*  MG 1.9 -- -- -- --    CBG: No results found for this basename: GLUCAP:5 in the last 168 hours  GFR Estimated Creatinine Clearance: 44.7 ml/min (by C-G formula based on Cr of 1.17).  Coagulation profile  Lab 12/02/11 2019  INR 1.47  PROTIME --    Cardiac Enzymes No results found for this basename: CK:3,CKMB:3,TROPONINI:3,MYOGLOBIN:3 in the last 168 hours  No components found with this basename: POCBNP:3 No results found for this basename: DDIMER:2 in the last 72 hours No results found for this basename: HGBA1C:2 in the last 72 hours No results found for this basename: CHOL:2,HDL:2,LDLCALC:2,TRIG:2,CHOLHDL:2,LDLDIRECT:2 in the last 72 hours No results found for this basename: TSH,T4TOTAL,FREET3,T3FREE,THYROIDAB in the last 72 hours No results found for this basename: VITAMINB12:2,FOLATE:2,FERRITIN:2,TIBC:2,IRON:2,RETICCTPCT:2 in the last 72 hours No results found for this basename: LIPASE:2,AMYLASE:2 in the last 72 hours  Urine Studies No results found for this basename: UACOL:2,UAPR:2,USPG:2,UPH:2,UTP:2,UGL:2,UKET:2,UBIL:2,UHGB:2,UNIT:2,UROB:2,ULEU:2,UEPI:2,UWBC:2,URBC:2,UBAC:2,CAST:2,CRYS:2,UCOM:2,BILUA:2 in the last 72 hours  MICROBIOLOGY: Recent Results (from the past 240 hour(s))  CULTURE, BLOOD (ROUTINE X 2)     Status: Normal (Preliminary result)   Collection Time   12/02/11  4:25 PM      Component Value Range Status Comment   Specimen Description BLOOD LEFT HAND   Final    Special Requests BOTTLES DRAWN AEROBIC ONLY 10CC   Final    Culture  Setup Time 12/03/2011 11:40   Final    Culture     Final    Value:        BLOOD CULTURE RECEIVED NO GROWTH TO DATE CULTURE WILL BE HELD FOR 5 DAYS BEFORE ISSUING A FINAL NEGATIVE REPORT   Report Status PENDING   Incomplete   CULTURE, BLOOD (ROUTINE X 2)     Status: Normal (Preliminary result)   Collection Time   12/02/11  4:32 PM      Component Value Range Status Comment    Specimen Description BLOOD LEFT HAND   Final    Special Requests BOTTLES DRAWN AEROBIC ONLY   Final    Culture  Setup Time 12/03/2011 11:40   Final    Culture     Final    Value:        BLOOD CULTURE RECEIVED NO GROWTH TO DATE CULTURE WILL BE HELD FOR 5 DAYS BEFORE ISSUING A FINAL NEGATIVE REPORT   Report Status PENDING   Incomplete   URINE CULTURE     Status: Normal   Collection Time   12/02/11  5:19 PM      Component Value Range Status Comment   Specimen Description URINE, CLEAN CATCH   Final    Special Requests NONE   Final    Culture  Setup Time 12/03/2011 11:39   Final    Colony Count 85,000 COLONIES/ML   Final    Culture     Final    Value: Multiple  bacterial morphotypes present, none predominant. Suggest appropriate recollection if clinically indicated.   Report Status 12/04/2011 FINAL   Final     RADIOLOGY STUDIES/RESULTS: Ct Abdomen Pelvis Wo Contrast  12/03/2011  *RADIOLOGY REPORT*  Clinical Data: Abdominal pain  CT ABDOMEN AND PELVIS WITHOUT CONTRAST  Technique:  Multidetector CT imaging of the abdomen and pelvis was performed following the standard protocol without intravenous contrast.  Comparison: 04/25/2011  Findings: Probable dependent atelectasis in the bilateral lung bases.  Small hiatal hernia.  Unenhanced liver, spleen, pancreas, and adrenal glands are within normal limits.  Gallbladder is unremarkable.  No intrahepatic or extrahepatic ductal dilatation.  At least two punctate nonobstructing right renal calculi. Bilateral renal sinus cysts.  Extrarenal pelves.  No hydronephrosis.  No evidence of bowel obstruction.  Normal appendix.  Atherosclerotic calcifications of the abdominal aorta and branch vessels.  No abdominopelvic ascites.  No suspicious abdominopelvic lymphadenopathy.  Prostate is unremarkable.  No ureteral or bladder calculi.  Small fat-containing left inguinal hernia.  Degenerative changes of the visualized thoracolumbar spine.  IMPRESSION: No evidence of  bowel obstruction.  Normal appendix.  Two punctate nonobstructing right renal calculi.  No ureteral or bladder calculi.  No hydronephrosis.  No CT findings to account for the patient's abdominal pain.   Original Report Authenticated By: Charline Bills, M.D.    US Renal  12/04/2011  *RADIOLOGY REPORT*  Clinical Data: Renal failure  RENAL/URINARY TRACT ULTRASOUND COMPLETE  Comparison:  CT abdomen pelvis dated 12/02/2011 and 04/25/2011  Findings:  Right Kidney:  Measures 11.1 cm.  Mild fullness of the renal collecting system with an extrarenal pelvis, unchanged from prior CTs.  Left Kidney:  Measures 11.2 cm.  Mild fullness of the renal collecting system with an extrarenal pelvis, unchanged from prior CTs.  Bladder:  Distended.  IMPRESSION: Mild fullness of the bilateral renal collecting systems with extrarenal pelves, unchanged from prior CTs.  Distended bladder.   Original Report Authenticated By: Charline Bills, M.D.    Dg Chest Port 1 View  12/02/2011  *RADIOLOGY REPORT*  Clinical Data: Fever, weakness, shortness of breath  PORTABLE CHEST - 1 VIEW  Comparison: 11/13/2010  Findings: Lungs are essentially clear.  No focal dilatation. No pleural effusion or pneumothorax.  The heart is normal in size.  IMPRESSION: No evidence of acute cardiopulmonary disease.   Original Report Authenticated By: Charline Bills, M.D.     MEDICATIONS: Scheduled Meds:    . amiodarone  400 mg Oral BID  . aspirin EC  81 mg Oral Daily  . cholecalciferol  1,000 Units Oral Daily  . diltiazem  20 mg Intravenous Once  . DULoxetine  30 mg Oral QHS  . enoxaparin (LOVENOX) injection  40 mg Subcutaneous Q24H  . finasteride  5 mg Oral Daily  . hydrALAZINE  5 mg Intravenous Once  . latanoprost  1 drop Both Eyes QHS  . pantoprazole  40 mg Oral Q1200  . potassium chloride  40 mEq Oral Once  . Tamsulosin HCl  0.4 mg Oral Daily  . valACYclovir  1,000 mg Oral Daily  . vancomycin  1,000 mg Intravenous Q24H  . white petrolatum       . DISCONTD: amiodarone  150 mg Intravenous Once  . DISCONTD: enoxaparin (LOVENOX) injection  40 mg Subcutaneous Q24H   Continuous Infusions:    . sodium chloride 10 mL/hr at 12/07/11 1957  . amiodarone (NEXTERONE PREMIX) 360 mg/200 mL dextrose 60 mg/hr (12/07/11 2149)  . DISCONTD: amiodarone (NEXTERONE PREMIX) 360 mg/200 mL dextrose  30 mg/hr (12/08/11 0526)  . DISCONTD: diltiazem (CARDIZEM) infusion     PRN Meds:.acetaminophen, acetaminophen, ALPRAZolam, fentaNYL, hydrALAZINE, HYDROcodone-acetaminophen, iohexol, midazolam, morphine injection, ondansetron (ZOFRAN) IV, ondansetron  Antibiotics: Anti-infectives     Start     Dose/Rate Route Frequency Ordered Stop   12/06/11 2200   vancomycin (VANCOCIN) IVPB 1000 mg/200 mL premix        1,000 mg 200 mL/hr over 60 Minutes Intravenous Every 24 hours 12/06/11 0821     12/06/11 1000   valACYclovir (VALTREX) tablet 1,000 mg        1,000 mg Oral Daily 12/06/11 0931     12/05/11 2200   vancomycin (VANCOCIN) 750 mg in sodium chloride 0.9 % 150 mL IVPB  Status:  Discontinued        750 mg 150 mL/hr over 60 Minutes Intravenous Every 24 hours 12/05/11 2037 12/06/11 0821   12/05/11 1000   valACYclovir (VALTREX) tablet 500 mg  Status:  Discontinued        500 mg Oral Daily 12/04/11 1642 12/06/11 0931   12/03/11 1800   vancomycin (VANCOCIN) IVPB 1000 mg/200 mL premix  Status:  Discontinued        1,000 mg 200 mL/hr over 60 Minutes Intravenous Every 24 hours 12/02/11 2208 12/03/11 1005   12/03/11 1800   vancomycin (VANCOCIN) 750 mg in sodium chloride 0.9 % 150 mL IVPB  Status:  Discontinued        750 mg 150 mL/hr over 60 Minutes Intravenous Every 24 hours 12/03/11 1005 12/04/11 0946   12/03/11 1100   valACYclovir (VALTREX) tablet 1,000 mg  Status:  Discontinued        1,000 mg Oral Daily 12/03/11 1019 12/04/11 1642   12/02/11 2200   valACYclovir (VALTREX) tablet 1,000 mg  Status:  Discontinued        1,000 mg Oral 2 times daily  12/02/11 2149 12/03/11 1019   12/02/11 1845   vancomycin (VANCOCIN) IVPB 1000 mg/200 mL premix        1,000 mg 200 mL/hr over 60 Minutes Intravenous  Once 12/02/11 1837 12/02/11 Benay Pike, MD  [facility] Pager:[phone]  If 7PM-7AM, please contact night-coverage [email or web address] Password [facility] 12/08/2011, 11:58 AM   LOS: 6 days

## 2011-12-08 NOTE — Procedures (Signed)
Successful IVC FILTER VIA RT IJ ACCESS No comp Stable Full report in PACS

## 2011-12-09 DIAGNOSIS — I4891 Unspecified atrial fibrillation: Secondary | ICD-10-CM

## 2011-12-09 DIAGNOSIS — I480 Paroxysmal atrial fibrillation: Secondary | ICD-10-CM

## 2011-12-09 LAB — CULTURE, BLOOD (ROUTINE X 2): Culture: NO GROWTH

## 2011-12-09 MED ORDER — FINASTERIDE 5 MG PO TABS: 5.0000 mg | ORAL_TABLET | Freq: Every day | ORAL | Status: DC

## 2011-12-09 MED ORDER — VALACYCLOVIR HCL 1 G PO TABS: 1000.0000 mg | ORAL_TABLET | Freq: Every day | ORAL | Status: DC

## 2011-12-09 MED ORDER — AMIODARONE HCL 200 MG PO TABS: 400.0000 mg | ORAL_TABLET | Freq: Two times a day (BID) | ORAL | Status: DC

## 2011-12-09 NOTE — Discharge Summary (Addendum)
PATIENT DETAILS Name: Jason [name] Age: 78 y.o. Sex: male Date of Birth: 14-Oct-1919 MRN: [id number]. Admit Date: 12/02/2011 Admitting Physician: Catarina Hartshorn, MD FAO:[id number],QIONGE TOM, MD  Recommendations for Outpatient Follow-up:  1. Re-evaluate need for anticoagulation 2. TSH slightly elevated-monitor closely as now on amiodarone 3. Now has a IVC filter  PRIMARY DISCHARGE DIAGNOSIS:  Active Problems:  Herpes zoster  Cellulitis, leg  Thrombocytopenia  CAD (coronary artery disease)  CKD (chronic kidney disease), stage III  ARF (acute renal failure)  PAF (paroxysmal atrial fibrillation)      PAST MEDICAL HISTORY: Past Medical History  Diagnosis Date  . PVD (peripheral vascular disease)   . CAD (coronary artery disease)     a. 12/2005 - PCI to OM  . Hyperlipidemia   . Cerebrovascular accident   . DVT femoral (deep venous thrombosis) with thrombophlebitis   . GERD (gastroesophageal reflux disease)   . Edema   . Anxiety   . Vertigo   . IBS (irritable bowel syndrome)   . Duodenitis   . Gastritis   . Esophageal stricture   . Cellulitis   . Pulmonary embolism   . Prostatic hypertrophy   . Hiatal hernia   . Frequent falls   . Subdural hematoma   . PTSD (post-traumatic stress disorder)   . Vertigo   . TIA (transient ischemic attack)     a. 01/2006 and 05/2006.     DISCHARGE MEDICATIONS:   Medication List     As of 12/09/2011 10:20 AM    STOP taking these medications         rivaroxaban 10 MG Tabs tablet   Commonly known as: XARELTO      TAKE these medications         ALPRAZolam 0.25 MG tablet   Commonly known as: XANAX   Take 0.25 mg by mouth at bedtime as needed. anxiety      AMBIEN 10 MG tablet   Generic drug: zolpidem   Take 5 mg by mouth at bedtime as needed. sleep      amiodarone 200 MG tablet   Commonly known as: PACERONE   Take 2 tablets (400 mg total) by mouth 2 (two) times daily. Take 400 mg Twice daily for 13 more days, then take 200 mg daily.        aspirin 81 MG tablet   Take 81 mg by mouth daily.      DULoxetine 30 MG capsule   Commonly known as: CYMBALTA   Take 30 mg by mouth at bedtime.      finasteride 5 MG tablet   Commonly known as: PROSCAR   Take 1 tablet (5 mg total) by mouth daily.      FLOMAX 0.4 MG Caps   Generic drug: Tamsulosin HCl   Take 0.4 mg by mouth daily.      gabapentin 300 MG capsule   Commonly known as: NEURONTIN   Take 300 mg by mouth 2 (two) times daily.      HYDROcodone-acetaminophen 5-325 MG per tablet   Commonly known as: NORCO/VICODIN   Take 1 tablet by mouth every 6 (six) hours as needed. Pain        meclizine 25 MG tablet   Commonly known as: ANTIVERT   Take 12.5 mg by mouth as needed. dizziness      MIRALAX powder   Generic drug: polyethylene glycol powder   Take 17 g by mouth at bedtime. Constipation        nitroGLYCERIN 0.4  MG SL tablet   Commonly known as: NITROSTAT   Place 0.4 mg under the tongue every 5 (five) minutes as needed. Chest pain      omeprazole 20 MG capsule   Commonly known as: PRILOSEC   Take 20 mg by mouth 2 (two) times daily before a meal.      promethazine 25 MG tablet   Commonly known as: PHENERGAN   Take 25 mg by mouth every 6 (six) hours as needed. nausea      STOOL SOFTENER PO   Take 1 capsule by mouth at bedtime.      valACYclovir 1000 MG tablet   Commonly known as: VALTREX   Take 1 tablet (1,000 mg total) by mouth daily.      Vitamin D3 1000 UNITS Caps   Take 1 capsule by mouth daily.          BRIEF HPI:  See H&P, Labs, Consult and Test reports for all details in brief, Patient is a 78 y.o. 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 It was determined that the patient has herpes zoster of the left buttocks with a  possible cellulitis of the left thigh  CONSULTATIONS:   cardiology, nephrology, urology and interventional radiology  PERTINENT RADIOLOGIC STUDIES: Ct Abdomen Pelvis Wo Contrast  12/03/2011  *RADIOLOGY REPORT*  Clinical Data: Abdominal pain  CT ABDOMEN AND PELVIS WITHOUT CONTRAST  Technique:  Multidetector CT imaging of the abdomen and pelvis was performed following the standard protocol without intravenous contrast.  Comparison: 04/25/2011  Findings: Probable dependent atelectasis in the bilateral lung bases.  Small hiatal hernia.  Unenhanced liver, spleen, pancreas, and adrenal glands are within normal limits.  Gallbladder is unremarkable.  No intrahepatic or extrahepatic ductal dilatation.  At least two punctate nonobstructing right renal calculi. Bilateral renal sinus cysts.  Extrarenal pelves.  No hydronephrosis.  No evidence of bowel obstruction.  Normal appendix.  Atherosclerotic calcifications of the abdominal aorta and branch vessels.  No abdominopelvic ascites.  No suspicious abdominopelvic lymphadenopathy.  Prostate is unremarkable.  No ureteral or bladder calculi.  Small fat-containing left inguinal hernia.  Degenerative changes of the visualized thoracolumbar spine.  IMPRESSION: No evidence of bowel obstruction.  Normal appendix.  Two punctate nonobstructing right renal calculi.  No ureteral or bladder calculi.  No hydronephrosis.  No CT findings to account for the patient's abdominal pain.   Original Report Authenticated By: Charline Bills, M.D.    Ir Ivc Filter Plmt / S&i /img Guid/mod Sed  12/08/2011  *RADIOLOGY REPORT*  Clinical Data:  DVT, fall risk, the patient is not a candidate for anticoagulation.  ULTRASOUND GUIDANCE FOR VASCULAR ACCESS IVC CATHETERIZATION AND VENOGRAM IVC FILTER INSERTION  Date:  12/08/2011 09:35:00  Radiologist:  M. Ruel Favors, M.D.  Medications:  1 mg Versed, 50 mcg Fentanyl  Guidance:  Ultrasound and fluoroscopic  Fluoroscopy time:  2.2 minutes  Sedation time:   20 minutes  Contrast Volume:  40 ml Omnipaque-300  Complications:  No immediate  PROCEDURE/FINDINGS:  Informed consent was obtained from the patient following explanation of the procedure, risks, benefits and alternatives. The patient understands, agrees and consents for the procedure. All questions were addressed.  A time out was performed.  Maximal barrier sterile technique utilized including caps, mask, sterile gowns, sterile gloves, large sterile drape,  hand hygiene, and betadine prep.  Under sterile condition and local anesthesia, right internal jugular venous access was performed with ultrasound.  Over a guide wire, the IVC filter delivery sheath and inner dilator were advanced into the IVC just above the IVC bifurcation.  Contrast injection was performed for an IVC venogram.  IVC VENOGRAM:  The IVC is patent.  No evidence of thrombus, stenosis, or occlusion.  No variant venous anatomy.  The renal veins are identified at L1.  IVC FILTER INSERTION:  Through the delivery sheath, the Cook Celect IVC filter was deployed in the infrarenal IVC at the L2-L3 level just below the renal veins and above the IVC bifurcation.  Contrast injection confirmed position.  There is good apposition of the filter against the IVC.  The delivery sheath was removed and hemostasis was obtained with compression for 5 minutes.  The patient tolerated the procedure well.  No immediate complications.  IMPRESSION: Ultrasound and fluoroscopically guided infrarenal IVC filter insertion.   Original Report Authenticated By: Judie Petit. Ruel Favors, M.D.    US Renal  12/04/2011  *RADIOLOGY REPORT*  Clinical Data: Renal failure  RENAL/URINARY TRACT ULTRASOUND COMPLETE  Comparison:  CT abdomen pelvis dated 12/02/2011 and 04/25/2011  Findings:  Right Kidney:  Measures 11.1 cm.  Mild fullness of the renal collecting system with an extrarenal pelvis, unchanged from prior CTs.  Left Kidney:  Measures 11.2 cm.  Mild fullness of the renal collecting system  with an extrarenal pelvis, unchanged from prior CTs.  Bladder:  Distended.  IMPRESSION: Mild fullness of the bilateral renal collecting systems with extrarenal pelves, unchanged from prior CTs.  Distended bladder.   Original Report Authenticated By: Charline Bills, M.D.    Dg Chest Port 1 View  12/02/2011  *RADIOLOGY REPORT*  Clinical Data: Fever, weakness, shortness of breath  PORTABLE CHEST - 1 VIEW  Comparison: 11/13/2010  Findings: Lungs are essentially clear.  No focal dilatation. No pleural effusion or pneumothorax.  The heart is normal in size.  IMPRESSION: No evidence of acute cardiopulmonary disease.   Original Report Authenticated By: Charline Bills, M.D.      PERTINENT LAB RESULTS: CBC:  Basename 12/08/11 0616 12/07/11 0507  WBC 7.7 7.4  HGB 13.3 13.6  HCT 38.3* 38.7*  PLT 175 155   CMET CMP     Component Value Date/Time   NA 137 12/08/2011 0616   K 4.2 12/08/2011 0616   CL 103 12/08/2011 0616   CO2 25 12/08/2011 0616   GLUCOSE 101* 12/08/2011 0616   GLUCOSE 121* 01/01/2006 1450   BUN 10 12/08/2011 0616   CREATININE 1.17 12/08/2011 0616   CALCIUM 9.0 12/08/2011 0616   PROT 6.5 12/02/2011 1633   ALBUMIN 3.1* 12/02/2011 1633   AST 23 12/02/2011 1633   ALT 16 12/02/2011 1633   ALKPHOS 73 12/02/2011 1633   BILITOT 0.4 12/02/2011 1633   GFRNONAA 53* 12/08/2011 0616   GFRAA 61* 12/08/2011 0616    GFR Estimated Creatinine Clearance: 44.7 ml/min (by C-G formula based on Cr of 1.17). No results found for this basename: LIPASE:2,AMYLASE:2 in the last 72 hours No results found for this basename: CKTOTAL:3,CKMB:3,CKMBINDEX:3,TROPONINI:3 in the last 72 hours No components found with this basename: POCBNP:3 No results found for this basename: DDIMER:2 in the last 72 hours No results found for this basename: HGBA1C:2 in the last 72 hours No results found for this basename: CHOL:2,HDL:2,LDLCALC:2,TRIG:2,CHOLHDL:2,LDLDIRECT:2 in the last 72 hours  Basename 12/08/11 0616  TSH 5.754*    T4TOTAL --  T3FREE --  THYROIDAB --   No results found for this basename: VITAMINB12:2,FOLATE:2,FERRITIN:2,TIBC:2,IRON:2,RETICCTPCT:2 in the last 72 hours Coags: No results found for this basename: PT:2,INR:2 in the last 72 hours Microbiology: Recent Results (from the past 240 hour(s))  CULTURE, BLOOD (ROUTINE X 2)     Status: Normal (Preliminary result)   Collection Time   12/02/11  4:25 PM      Component Value Range Status Comment   Specimen Description BLOOD LEFT HAND   Final    Special Requests BOTTLES DRAWN AEROBIC ONLY 10CC   Final    Culture  Setup Time 12/03/2011 11:40   Final    Culture     Final    Value:        BLOOD CULTURE RECEIVED NO GROWTH TO DATE CULTURE WILL BE HELD FOR 5 DAYS BEFORE ISSUING A FINAL NEGATIVE REPORT   Report Status PENDING   Incomplete   CULTURE, BLOOD (ROUTINE X 2)     Status: Normal (Preliminary result)   Collection Time   12/02/11  4:32 PM      Component Value Range Status Comment   Specimen Description BLOOD LEFT HAND   Final    Special Requests BOTTLES DRAWN AEROBIC ONLY   Final    Culture  Setup Time 12/03/2011 11:40   Final    Culture     Final    Value:        BLOOD CULTURE RECEIVED NO GROWTH TO DATE CULTURE WILL BE HELD FOR 5 DAYS BEFORE ISSUING A FINAL NEGATIVE REPORT   Report Status PENDING   Incomplete   URINE CULTURE     Status: Normal   Collection Time   12/02/11  5:19 PM      Component Value Range Status Comment   Specimen Description URINE, CLEAN CATCH   Final    Special Requests NONE   Final    Culture  Setup Time 12/03/2011 11:39   Final    Colony Count 85,000 COLONIES/ML   Final    Culture     Final    Value: Multiple bacterial morphotypes present, none predominant. Suggest appropriate recollection if clinically indicated.   Report Status 12/04/2011 FINAL   Final      BRIEF HOSPITAL COURSE:   Active Problems: Herpes zoster dermatitis  -involving the left gluteal region and the left upper thigh  -seems to be much  better  -continue with Valtrex for 3 more days-to complete a 10 day course  Cellulitis, left Buttock  -resolved  -was on IV Vanco during his inpatient stay, this was been stopped  Paroxysmal Afib-with RVR -this was seen during this hospital stay, tried IV Diltiazem with no response, Cardiology was consulted-subsequently placed on Amiodarone, with restoration of Sinus Rhythm. Cards recommending Amiodarone 400 mg BID for a total of 2 weeks, then to transition to 200 mg daily. Given unsteady gait, deconditioning and hematuria (2/2 foley trauma)-not currently a anticoagulation candidate. Place on ASA, re-evaluate need for anticoagulation in the near future. Have had long discussions with daughter/son at bedside, difficult situation, ideally would need anti-coagulation-however at this time-feel risk outweigh any potential benefits, they understand and are agreeable to just place on ASA for now. -follow up with Dr Excell Seltzer  Acute renal failure, on CKD stage II- III  -this has resolved -etiology of this is obstructive-from BPH-creatinine at one point peaked to 4.36, ans subsequently slowly came down back to usual baseline after foley catheter insertion. -seen by renal for consultation  Obstructive  Uropathy -causing ARF-likely from BPH -Now with a foley cath -Voiding trial was attempted-on 9/26-unsuccessful, foley had to be re-inserted. -now on both flomax and finasteride -keep foley on discharge, follow up with Urology as outpatient in a couple of weeks, for a repeat voiding trial -seen by Urology this admit  Hematuria  -likely 2/2 to foley trauma  -was persistent now for 2 days, clearly with advanced age, delirium issues, unsteady gait-continues to be high risk for foley trauma-was on anticoagulation with Heparin gtt-this was stopped after discussion with family. Thankfully this resolved 9/27.  -now no longer on therapeutic anticoagulation (see below)  Thrombocytopenia  -resolved, check  periodically   Coronary artery disease  -History of bare-metal stent, continue aspirin.  History of recurrent DVT, legs  -Patient was on Xarelto as outpatient  -per family he apparently has had 2 unexplained VTE's-DVT and PE-and has been told that he needs life long anticoagulation. However with ongoing hematuria while hospitalized, and increased fall risk-risks due to deconditioning,risks of anticoagulation seems to outweigh any potential benefits. Difficult situation-long discussion with family-options including-stopping all anticoagulation-just keeping on ASA, stopping Anticoagulation-placing IVC filter and continuing anticoagulation discussed.Family ok to stop anticoagulation in the short term,and finally opted for a IVC filter placement-this was done 9/27 by IR. -Re-evaluate need for anticoagulation in the near future  TODAY-DAY OF DISCHARGE:  Subjective:   Jason [name] today has no headache,no chest abdominal pain,no new weakness tingling or numbness, feels much better wants to go home today.   Objective:   Blood pressure 164/77, pulse 70, temperature 97.8 F (36.6 C), temperature source Oral, resp. rate 18, height 5\' 11"  (1.803 m), weight 78.9 kg (173 lb 15.1 oz), SpO2 95.00%.  Intake/Output Summary (Last 24 hours) at 12/09/11 1020 Last data filed at 12/09/11 0900  Gross per 24 hour  Intake    760 ml  Output   1400 ml  Net   -640 ml    Exam Awake Alert, Oriented *3, No new F.N deficits, Normal affect [place].AT,PERRAL Supple Neck,No JVD, No cervical lymphadenopathy appriciated.  Symmetrical Chest wall movement, Good air movement bilaterally, CTAB RRR,No Gallops,Rubs or new Murmurs, No Parasternal Heave +ve B.Sounds, Abd Soft, Non tender, No organomegaly appriciated, No rebound -guarding or rigidity. No Cyanosis, Clubbing or edema, No new Rash or bruise  DISCHARGE CONDITION: Stable  DISPOSITION: HOME with HHPT  DISCHARGE INSTRUCTIONS:    Activity:  As tolerated with Full  fall precautions use walker/cane & assistance as needed  Diet recommendation:  Heart Healthy diet  Follow-up Information    Follow up with [facility] TOM, MD. Schedule an appointment as soon as possible for a visit in 1 week.   Contact information:   [address] [place] [address] [phone]       Follow up with Tonny Bollman, MD. Schedule an appointment as soon as possible for a visit in 1 week.   Contact information:   [address] [place] [address] [phone]       Follow up with Chelsea Aus, MD. Schedule an appointment as soon as possible for a visit in 2 weeks.   Contact information:   [address] [place] [address] [phone]         Total Time spent on discharge equals 45 minutes.  SignedJeoffrey Massed 12/09/2011 10:20 AM

## 2011-12-09 NOTE — Progress Notes (Signed)
Patient ID: JLYNN ENBERG, male   DOB: June 17, 1919, 78 y.o.   MRN: [id number]    Subjective:  No chest pain, palps, or dyspnea.   Objective:  Vital Signs in the last 24 hours: Temp:  [97.8 F (36.6 C)-98.7 F (37.1 C)] 97.8 F (36.6 C) (09/28 0453) Pulse Rate:  [66-78] 70  (09/28 0453) Resp:  [18-24] 18  (09/28 0453) BP: (138-181)/(75-88) 164/77 mmHg (09/28 0453) SpO2:  [93 %-100 %] 95 % (09/28 0453) Weight:  [173 lb 15.1 oz (78.9 kg)] 173 lb 15.1 oz (78.9 kg) (09/28 0453)  Intake/Output from previous day: 09/27 0701 - 09/28 0700 In: 672 [P.O.:640; I.V.:32] Out: 1400 [Urine:1400]  Physical Exam: Pt is alert and oriented, elderly gentleman, in NAD HEENT: normal Neck: JVP - normal Lungs: CTA bilaterally CV: RRR without murmur or gallop Abd: soft, NT, Positive BS, no hepatomegaly Ext: no C/C/E, distal pulses intact and equal Skin: mild stasis changes bilaterally   Telemetry:  NSR no afib 12/09/2011   Lab Results:  Basename 12/08/11 0616 12/07/11 0507  WBC 7.7 7.4  HGB 13.3 13.6  PLT 175 155    Basename 12/08/11 0616 12/07/11 0507  NA 137 138  K 4.2 3.4*  CL 103 102  CO2 25 21  GLUCOSE 101* 81  BUN 10 10  CREATININE 1.17 1.01   No results found for this basename: TROPONINI:2,CK,MB:2 in the last 72 hours   Assessment/Plan:  1. Paroxysmal atrial fibrillation. Now in sinus rhythm on amiodarone. On Amiodarone 400 mg BID x 2 weeks, then 200 mg daily. Not currently a candidate for anticoagulation. Otherwise continue ASA 81 mg daily.  2. Recurrent DVT. IVC filter placed yesterday  3. CAD, native vessel. No ischemic symptoms.   For follow-up, will need amio surveillance labs. Baseline LFT's are normal. TSH has been drawn and result is pending.  Will arrange outpatient visit for hospital follow-up.  Charlton Haws, M.D. 12/09/2011, 8:20 AM

## 2011-12-11 ENCOUNTER — Telehealth (HOSPITAL_COMMUNITY): Payer: Self-pay | Admitting: *Deleted

## 2011-12-11 NOTE — Telephone Encounter (Signed)
Post procedure radiology phone call attempted.  Left message on identified answering machine.

## 2011-12-15 ENCOUNTER — Encounter: Payer: Self-pay | Admitting: Cardiovascular Disease

## 2011-12-15 ENCOUNTER — Ambulatory Visit (INDEPENDENT_AMBULATORY_CARE_PROVIDER_SITE_OTHER): Payer: Medicare Other | Admitting: Cardiovascular Disease

## 2011-12-15 VITALS — BP 130/68 | HR 75 | Resp 18 | Ht 72.0 in | Wt 175.1 lb

## 2011-12-15 DIAGNOSIS — I2510 Atherosclerotic heart disease of native coronary artery without angina pectoris: Secondary | ICD-10-CM

## 2011-12-15 NOTE — Progress Notes (Signed)
HPI:  78 year old gentleman presenting for followup evaluation. The patient has coronary artery disease status post bare-metal stenting of the left circumflex now several years ago. He's had recurrent DVTs, and recently was taken off of anticoagulant therapy and he underwent placement of an IVC filter. He was recently hospitalized with fever, confusion, and concern about a soft tissue infection. Patient was diagnosed with herpes zoster of the left buttocks. He had paroxysmal atrial fibrillation during his hospital stay and he was treated with amiodarone. It was felt best to keep him off of anticoagulation because of unsteady gait and hematuria. He was placed on aspirin.  The patient feels pretty well. His appetite is coming back. He's had no chest pain or pressure. He denies dyspnea, orthopnea, or PND. He's had mild leg swelling.  Outpatient Encounter Prescriptions as of 12/15/2011  Medication Sig Dispense Refill  . ALPRAZolam (XANAX) 0.25 MG tablet Take 0.25 mg by mouth at bedtime as needed. anxiety      . amiodarone (PACERONE) 200 MG tablet Take 2 tablets (400 mg total) by mouth 2 (two) times daily. Take 400 mg Twice daily for 13 more days, then take 200 mg daily.  90 tablet  0  . aspirin 81 MG tablet Take 81 mg by mouth daily.        . Cholecalciferol (VITAMIN D3) 1000 UNITS CAPS Take 1 capsule by mouth daily.        Tery Sanfilippo Calcium (STOOL SOFTENER PO) Take 1 capsule by mouth at bedtime.      . DULoxetine (CYMBALTA) 30 MG capsule Take 30 mg by mouth at bedtime.      . finasteride (PROSCAR) 5 MG tablet Take 1 tablet (5 mg total) by mouth daily.  30 tablet  0  . gabapentin (NEURONTIN) 300 MG capsule Take 300 mg by mouth 2 (two) times daily.      [name] HYDROcodone-acetaminophen (NORCO) 5-325 MG per tablet Take 1 tablet by mouth every 6 (six) hours as needed. Pain       . meclizine (ANTIVERT) 25 MG tablet Take 12.5 mg by mouth as needed. dizziness      . nitroGLYCERIN (NITROSTAT) 0.4 MG SL tablet  Place 0.4 mg under the tongue every 5 (five) minutes as needed. Chest pain      . omeprazole (PRILOSEC) 20 MG capsule Take 20 mg by mouth 2 (two) times daily before a meal.       . polyethylene glycol powder (MIRALAX) powder Take 17 g by mouth at bedtime. Constipation       . promethazine (PHENERGAN) 25 MG tablet Take 25 mg by mouth every 6 (six) hours as needed. nausea      . Tamsulosin HCl (FLOMAX) 0.4 MG CAPS Take 0.4 mg by mouth daily.        . valACYclovir (VALTREX) 1000 MG tablet Take 1 tablet (1,000 mg total) by mouth daily.  3 tablet  0  . zolpidem (AMBIEN) 10 MG tablet Take 5 mg by mouth at bedtime as needed. sleep        Allergies  Allergen Reactions  . Amoxicillin-Pot Clavulanate Nausea And Vomiting    Unknown-daughter states he can tolerate Amoxicillin ok  . Fludrocortisone Acetate Other (See Comments)    unknown  . Scopace (Scopolamine) Other (See Comments)    *patch Unknown reaction    Past Medical History  Diagnosis Date  . PVD (peripheral vascular disease)   . CAD (coronary artery disease)     a. 12/2005 - PCI  to OM  . Hyperlipidemia   . Cerebrovascular accident   . DVT femoral (deep venous thrombosis) with thrombophlebitis   . GERD (gastroesophageal reflux disease)   . Edema   . Anxiety   . Vertigo   . IBS (irritable bowel syndrome)   . Duodenitis   . Gastritis   . Esophageal stricture   . Cellulitis   . Pulmonary embolism   . Prostatic hypertrophy   . Hiatal hernia   . Frequent falls   . Subdural hematoma   . PTSD (post-traumatic stress disorder)   . Vertigo   . TIA (transient ischemic attack)     a. 01/2006 and 05/2006.     ROS: Negative except as per HPI  BP 130/68  Pulse 75  Resp 18  Ht 6' (1.829 m)  Wt 79.434 kg (175 lb 1.9 oz)  BMI 23.75 kg/m2  SpO2 93%  PHYSICAL EXAM: Pt is alert and oriented, pleasant elderly male in NAD HEENT: normal Neck: JVP - normal, carotids 2+= with a left greater than right bruit Lungs: CTA bilaterally CV:  RRR without murmur or gallop Abd: soft, NT, Positive BS, no hepatomegaly Ext: Trace right pretibial edema, none on the left, distal pulses intact and equal Skin: warm/dry no rash  EKG:  Normal sinus rhythm 74 beats per minute, right bundle branch block.  ASSESSMENT AND PLAN: 1. Recurrent DVT. The patient is status post IVC filter. No major signs of a post phlebitic syndrome.  2. Paroxysmal atrial fibrillation. He is maintaining sinus rhythm on oral amiodarone. He understands the instructions to reduce his amiodarone dose to 200 mg daily next week. He should have a followup TSH and liver function test in 3 months. He will remain on aspirin because of fall risk. We discussed the risk/benefit of anticoagulation at length and agree that it is probably best to stay off of anticoagulant therapy at the present time. He has a history of fall with subdural hematoma.  3. Coronary artery disease, native vessel. He remained stable without anginal symptoms.

## 2011-12-15 NOTE — Patient Instructions (Addendum)
Your physician recommends that you continue on your current medications as directed. Please refer to the Current Medication list given to you today.  Your physician recommends that you return for lab work in: 3 MONTHS--Liver and TSH  Your physician wants you to follow-up in: 6 MONTHS with Dr Excell Seltzer.  You will receive a reminder letter in the mail two months in advance. If you don't receive a letter, please call our office to schedule the follow-up appointment.

## 2011-12-17 ENCOUNTER — Encounter (HOSPITAL_COMMUNITY): Payer: Self-pay | Admitting: Emergency Medicine

## 2011-12-17 ENCOUNTER — Inpatient Hospital Stay (HOSPITAL_COMMUNITY)
Admission: EM | Admit: 2011-12-17 | Discharge: 2011-12-20 | DRG: 698 | Disposition: A | Discharge status: Home / Self Care | Payer: Medicare Other | Attending: Family Medicine | Admitting: Family Medicine

## 2011-12-17 ENCOUNTER — Emergency Department (HOSPITAL_COMMUNITY): Payer: Medicare Other

## 2011-12-17 DIAGNOSIS — Z9861 Coronary angioplasty status: Secondary | ICD-10-CM

## 2011-12-17 DIAGNOSIS — T462X5A Adverse effect of other antidysrhythmic drugs, initial encounter: Secondary | ICD-10-CM | POA: Diagnosis present

## 2011-12-17 DIAGNOSIS — Z9181 History of falling: Secondary | ICD-10-CM

## 2011-12-17 DIAGNOSIS — I5032 Chronic diastolic (congestive) heart failure: Secondary | ICD-10-CM | POA: Diagnosis present

## 2011-12-17 DIAGNOSIS — L03119 Cellulitis of unspecified part of limb: Secondary | ICD-10-CM

## 2011-12-17 DIAGNOSIS — N401 Enlarged prostate with lower urinary tract symptoms: Secondary | ICD-10-CM | POA: Diagnosis present

## 2011-12-17 DIAGNOSIS — D72829 Elevated white blood cell count, unspecified: Secondary | ICD-10-CM | POA: Diagnosis present

## 2011-12-17 DIAGNOSIS — Z86718 Personal history of other venous thrombosis and embolism: Secondary | ICD-10-CM

## 2011-12-17 DIAGNOSIS — I10 Essential (primary) hypertension: Secondary | ICD-10-CM | POA: Diagnosis present

## 2011-12-17 DIAGNOSIS — A419 Sepsis, unspecified organism: Secondary | ICD-10-CM | POA: Diagnosis present

## 2011-12-17 DIAGNOSIS — R7402 Elevation of levels of lactic acid dehydrogenase (LDH): Secondary | ICD-10-CM | POA: Diagnosis present

## 2011-12-17 DIAGNOSIS — Z8673 Personal history of transient ischemic attack (TIA), and cerebral infarction without residual deficits: Secondary | ICD-10-CM

## 2011-12-17 DIAGNOSIS — N179 Acute kidney failure, unspecified: Secondary | ICD-10-CM

## 2011-12-17 DIAGNOSIS — Y846 Urinary catheterization as the cause of abnormal reaction of the patient, or of later complication, without mention of misadventure at the time of the procedure: Secondary | ICD-10-CM | POA: Diagnosis present

## 2011-12-17 DIAGNOSIS — R7401 Elevation of levels of liver transaminase levels: Secondary | ICD-10-CM | POA: Diagnosis present

## 2011-12-17 DIAGNOSIS — Z86711 Personal history of pulmonary embolism: Secondary | ICD-10-CM

## 2011-12-17 DIAGNOSIS — I4891 Unspecified atrial fibrillation: Secondary | ICD-10-CM | POA: Diagnosis present

## 2011-12-17 DIAGNOSIS — R42 Dizziness and giddiness: Secondary | ICD-10-CM

## 2011-12-17 DIAGNOSIS — N390 Urinary tract infection, site not specified: Secondary | ICD-10-CM | POA: Diagnosis present

## 2011-12-17 DIAGNOSIS — K219 Gastro-esophageal reflux disease without esophagitis: Secondary | ICD-10-CM | POA: Diagnosis present

## 2011-12-17 DIAGNOSIS — K589 Irritable bowel syndrome without diarrhea: Secondary | ICD-10-CM | POA: Diagnosis present

## 2011-12-17 DIAGNOSIS — F411 Generalized anxiety disorder: Secondary | ICD-10-CM | POA: Diagnosis present

## 2011-12-17 DIAGNOSIS — D696 Thrombocytopenia, unspecified: Secondary | ICD-10-CM

## 2011-12-17 DIAGNOSIS — T83511A Infection and inflammatory reaction due to indwelling urethral catheter, initial encounter: Principal | ICD-10-CM | POA: Diagnosis present

## 2011-12-17 DIAGNOSIS — I739 Peripheral vascular disease, unspecified: Secondary | ICD-10-CM | POA: Diagnosis present

## 2011-12-17 DIAGNOSIS — N183 Chronic kidney disease, stage 3 unspecified: Secondary | ICD-10-CM | POA: Diagnosis present

## 2011-12-17 DIAGNOSIS — Z7982 Long term (current) use of aspirin: Secondary | ICD-10-CM

## 2011-12-17 DIAGNOSIS — F419 Anxiety disorder, unspecified: Secondary | ICD-10-CM | POA: Diagnosis present

## 2011-12-17 DIAGNOSIS — F4310 Post-traumatic stress disorder, unspecified: Secondary | ICD-10-CM | POA: Diagnosis present

## 2011-12-17 DIAGNOSIS — R339 Retention of urine, unspecified: Secondary | ICD-10-CM | POA: Diagnosis present

## 2011-12-17 DIAGNOSIS — I480 Paroxysmal atrial fibrillation: Secondary | ICD-10-CM

## 2011-12-17 DIAGNOSIS — I2510 Atherosclerotic heart disease of native coronary artery without angina pectoris: Secondary | ICD-10-CM | POA: Diagnosis present

## 2011-12-17 DIAGNOSIS — B965 Pseudomonas (aeruginosa) (mallei) (pseudomallei) as the cause of diseases classified elsewhere: Secondary | ICD-10-CM | POA: Diagnosis present

## 2011-12-17 DIAGNOSIS — B029 Zoster without complications: Secondary | ICD-10-CM | POA: Diagnosis present

## 2011-12-17 DIAGNOSIS — K449 Diaphragmatic hernia without obstruction or gangrene: Secondary | ICD-10-CM | POA: Diagnosis present

## 2011-12-17 DIAGNOSIS — N138 Other obstructive and reflux uropathy: Secondary | ICD-10-CM | POA: Diagnosis present

## 2011-12-17 DIAGNOSIS — I509 Heart failure, unspecified: Secondary | ICD-10-CM | POA: Diagnosis present

## 2011-12-17 DIAGNOSIS — K5909 Other constipation: Secondary | ICD-10-CM

## 2011-12-17 DIAGNOSIS — E785 Hyperlipidemia, unspecified: Secondary | ICD-10-CM | POA: Diagnosis present

## 2011-12-17 DIAGNOSIS — Z79899 Other long term (current) drug therapy: Secondary | ICD-10-CM

## 2011-12-17 LAB — COMPREHENSIVE METABOLIC PANEL
Alkaline Phosphatase: 116 U/L (ref 39–117)
BUN: 24 mg/dL — ABNORMAL HIGH (ref 6–23)
Chloride: 100 mEq/L (ref 96–112)
GFR calc Af Amer: 55 mL/min — ABNORMAL LOW (ref >90)
GFR calc non Af Amer: 48 mL/min — ABNORMAL LOW (ref >90)
Glucose, Bld: 102 mg/dL — ABNORMAL HIGH (ref 70–99)
Potassium: 3.7 mEq/L (ref 3.5–5.1)
Total Bilirubin: 0.5 mg/dL (ref 0.3–1.2)

## 2011-12-17 LAB — CBC WITH DIFFERENTIAL/PLATELET
Eosinophils Absolute: 0 10*3/uL (ref 0.0–0.7)
HCT: 39.3 % (ref 39.0–52.0)
Hemoglobin: 13.7 g/dL (ref 13.0–17.0)
Lymphs Abs: 1.1 10*3/uL (ref 0.7–4.0)
MCH: 33.2 pg (ref 26.0–34.0)
Monocytes Relative: 3 % (ref 3–12)
Neutro Abs: 17 10*3/uL — ABNORMAL HIGH (ref 1.7–7.7)
Neutrophils Relative %: 91 % — ABNORMAL HIGH (ref 43–77)
RBC: 4.13 MIL/uL — ABNORMAL LOW (ref 4.22–5.81)

## 2011-12-17 MED ORDER — ONDANSETRON HCL 4 MG/2ML IJ SOLN
4.0000 mg | Freq: Once | INTRAMUSCULAR | Status: AC
  Administered 2011-12-17: 4 mg via INTRAVENOUS
  Filled 2011-12-17: qty 2

## 2011-12-17 MED ORDER — SODIUM CHLORIDE 0.9 % IV SOLN
Freq: Once | INTRAVENOUS | Status: AC
  Administered 2011-12-18: 20 mL/h via INTRAVENOUS

## 2011-12-17 NOTE — ED Notes (Signed)
NS 500cc bolus infused.

## 2011-12-17 NOTE — ED Notes (Signed)
Pt and family did not state that pt had shingles until about 10 min into triage; Notified pt and family member, that they needed to tell caregivers that he has shingles d/t it is contagious; and harmful to others that are immunocompromised or pregnant; family verbalized understanding

## 2011-12-17 NOTE — ED Notes (Signed)
Pt seen by EDPA prior to RN assessment, see PA notes, orders received and initiated, family x2 at [facility], Pt reports stomach pain, pinpoint to periumbilical area, BS positive x4, abd soft diffusely mildly tender, foley in place, dark Popwell urine with noted, (denies: nvd), reports recently treated for shingles, low grade fever noted 100.3, mild pitting edema noted +2 to BLE.

## 2011-12-17 NOTE — ED Notes (Signed)
Updated pt/family that room is being cleaned

## 2011-12-17 NOTE — ED Notes (Signed)
Pt has been out of hospital for one week--was in for one week d/t fevers, diaphoresis--found out had shingles--had some difficulties when in the hospital; today started to have fever (100.3), urine was cloudy; pt now having SOB as well

## 2011-12-18 ENCOUNTER — Encounter (HOSPITAL_COMMUNITY): Payer: Self-pay | Admitting: General Practice

## 2011-12-18 ENCOUNTER — Inpatient Hospital Stay (HOSPITAL_COMMUNITY): Payer: Medicare Other

## 2011-12-18 DIAGNOSIS — I4891 Unspecified atrial fibrillation: Secondary | ICD-10-CM

## 2011-12-18 DIAGNOSIS — N390 Urinary tract infection, site not specified: Secondary | ICD-10-CM | POA: Diagnosis present

## 2011-12-18 DIAGNOSIS — A419 Sepsis, unspecified organism: Secondary | ICD-10-CM | POA: Diagnosis present

## 2011-12-18 LAB — CBC
HCT: 37.3 % — ABNORMAL LOW (ref 39.0–52.0)
Hemoglobin: 12.6 g/dL — ABNORMAL LOW (ref 13.0–17.0)
MCH: 32.6 pg (ref 26.0–34.0)
MCHC: 33.8 g/dL (ref 30.0–36.0)

## 2011-12-18 LAB — BASIC METABOLIC PANEL
BUN: 25 mg/dL — ABNORMAL HIGH (ref 6–23)
Calcium: 8.7 mg/dL (ref 8.4–10.5)
GFR calc non Af Amer: 41 mL/min — ABNORMAL LOW (ref >90)
Glucose, Bld: 72 mg/dL (ref 70–99)

## 2011-12-18 LAB — URINE MICROSCOPIC-ADD ON

## 2011-12-18 LAB — URINALYSIS, ROUTINE W REFLEX MICROSCOPIC
Ketones, ur: NEGATIVE mg/dL
Nitrite: POSITIVE — AB
Protein, ur: NEGATIVE mg/dL
Urobilinogen, UA: 1 mg/dL (ref 0.0–1.0)

## 2011-12-18 LAB — POCT I-STAT TROPONIN I

## 2011-12-18 MED ORDER — DEXTROSE 5 % IV SOLN: 1.0000 g | Freq: Once | INTRAVENOUS | Status: DC

## 2011-12-18 MED ORDER — VANCOMYCIN HCL 1000 MG IV SOLR: 750.0000 mg | Freq: Every day | INTRAVENOUS | Status: DC

## 2011-12-18 MED ORDER — ASPIRIN EC 81 MG PO TBEC
81.0000 mg | DELAYED_RELEASE_TABLET | Freq: Every day | ORAL | Status: DC
  Administered 2011-12-18 – 2011-12-20 (×3): 81 mg via ORAL
  Filled 2011-12-18 (×3): qty 1

## 2011-12-18 MED ORDER — TAMSULOSIN HCL 0.4 MG PO CAPS
0.4000 mg | ORAL_CAPSULE | Freq: Every day | ORAL | Status: DC
  Administered 2011-12-18 – 2011-12-20 (×3): 0.4 mg via ORAL
  Filled 2011-12-18 (×3): qty 1

## 2011-12-18 MED ORDER — ZOLPIDEM TARTRATE 5 MG PO TABS: 5.0000 mg | ORAL_TABLET | Freq: Every evening | ORAL | Status: DC | PRN

## 2011-12-18 MED ORDER — MECLIZINE HCL 12.5 MG PO TABS
12.5000 mg | ORAL_TABLET | ORAL | Status: DC | PRN
  Filled 2011-12-18: qty 1

## 2011-12-18 MED ORDER — HYDROCODONE-ACETAMINOPHEN 5-325 MG PO TABS: 1.0000 | ORAL_TABLET | Freq: Four times a day (QID) | ORAL | Status: DC | PRN

## 2011-12-18 MED ORDER — LATANOPROST 0.005 % OP SOLN
1.0000 [drp] | Freq: Every day | OPHTHALMIC | Status: DC
  Administered 2011-12-18 – 2011-12-19 (×2): 1 [drp] via OPHTHALMIC
  Filled 2011-12-18: qty 2.5

## 2011-12-18 MED ORDER — VANCOMYCIN HCL 1000 MG IV SOLR
750.0000 mg | INTRAVENOUS | Status: DC
  Administered 2011-12-19: 750 mg via INTRAVENOUS
  Filled 2011-12-18 (×2): qty 750

## 2011-12-18 MED ORDER — DULOXETINE HCL 30 MG PO CPEP
30.0000 mg | ORAL_CAPSULE | Freq: Every day | ORAL | Status: DC
  Administered 2011-12-18 – 2011-12-19 (×3): 30 mg via ORAL
  Filled 2011-12-18 (×4): qty 1

## 2011-12-18 MED ORDER — POLYETHYLENE GLYCOL 3350 17 G PO PACK
17.0000 g | PACK | Freq: Every day | ORAL | Status: DC
  Administered 2011-12-18 – 2011-12-19 (×2): 17 g via ORAL
  Filled 2011-12-18 (×4): qty 1

## 2011-12-18 MED ORDER — SODIUM CHLORIDE 0.9 % IV SOLN
1500.0000 mg | Freq: Once | INTRAVENOUS | Status: AC
  Administered 2011-12-18: 1500 mg via INTRAVENOUS
  Filled 2011-12-18: qty 1500

## 2011-12-18 MED ORDER — ALPRAZOLAM 0.25 MG PO TABS: 0.2500 mg | ORAL_TABLET | Freq: Every evening | ORAL | Status: DC | PRN

## 2011-12-18 MED ORDER — NITROGLYCERIN 0.4 MG SL SUBL: 0.4000 mg | SUBLINGUAL_TABLET | SUBLINGUAL | Status: DC | PRN

## 2011-12-18 MED ORDER — TRAMADOL HCL 50 MG PO TABS
25.0000 mg | ORAL_TABLET | Freq: Four times a day (QID) | ORAL | Status: DC | PRN
  Administered 2011-12-19: 25 mg via ORAL
  Filled 2011-12-18: qty 1

## 2011-12-18 MED ORDER — SODIUM CHLORIDE 0.9 % IJ SOLN
3.0000 mL | Freq: Two times a day (BID) | INTRAMUSCULAR | Status: DC
  Administered 2011-12-18 – 2011-12-19 (×2): 3 mL via INTRAVENOUS

## 2011-12-18 MED ORDER — PANTOPRAZOLE SODIUM 40 MG PO TBEC
40.0000 mg | DELAYED_RELEASE_TABLET | Freq: Every day | ORAL | Status: DC
  Administered 2011-12-18 – 2011-12-20 (×3): 40 mg via ORAL
  Filled 2011-12-18 (×3): qty 1

## 2011-12-18 MED ORDER — AMIODARONE HCL 200 MG PO TABS
400.0000 mg | ORAL_TABLET | Freq: Two times a day (BID) | ORAL | Status: DC
  Administered 2011-12-18: 400 mg via ORAL
  Filled 2011-12-18 (×2): qty 2

## 2011-12-18 MED ORDER — VITAMIN D3 25 MCG (1000 UNIT) PO TABS
1000.0000 [IU] | ORAL_TABLET | Freq: Every day | ORAL | Status: DC
  Administered 2011-12-18 – 2011-12-20 (×3): 1000 [IU] via ORAL
  Filled 2011-12-18 (×3): qty 1

## 2011-12-18 MED ORDER — POLYETHYLENE GLYCOL 3350 17 GM/SCOOP PO POWD
17.0000 g | Freq: Every day | ORAL | Status: DC
  Filled 2011-12-18: qty 255

## 2011-12-18 MED ORDER — PROMETHAZINE HCL 25 MG PO TABS: 25.0000 mg | ORAL_TABLET | Freq: Four times a day (QID) | ORAL | Status: DC | PRN

## 2011-12-18 MED ORDER — DEXTROSE 5 % IV SOLN
1.0000 g | Freq: Once | INTRAVENOUS | Status: AC
  Administered 2011-12-18: 1 g via INTRAVENOUS
  Filled 2011-12-18: qty 1

## 2011-12-18 MED ORDER — CEFEPIME HCL 1 G IJ SOLR
1.0000 g | Freq: Two times a day (BID) | INTRAMUSCULAR | Status: DC
  Administered 2011-12-18 – 2011-12-19 (×4): 1 g via INTRAVENOUS
  Filled 2011-12-18 (×6): qty 1

## 2011-12-18 MED ORDER — GABAPENTIN 300 MG PO CAPS
300.0000 mg | ORAL_CAPSULE | Freq: Two times a day (BID) | ORAL | Status: DC
  Administered 2011-12-18 – 2011-12-20 (×6): 300 mg via ORAL
  Filled 2011-12-18 (×7): qty 1

## 2011-12-18 MED ORDER — FINASTERIDE 5 MG PO TABS
5.0000 mg | ORAL_TABLET | Freq: Every day | ORAL | Status: DC
  Administered 2011-12-18 – 2011-12-20 (×3): 5 mg via ORAL
  Filled 2011-12-18 (×3): qty 1

## 2011-12-18 MED ORDER — SODIUM CHLORIDE 0.9 % IV SOLN
INTRAVENOUS | Status: DC
  Administered 2011-12-18: 18:00:00 via INTRAVENOUS
  Administered 2011-12-19: 1000 mL via INTRAVENOUS
  Administered 2011-12-20: 06:00:00 via INTRAVENOUS

## 2011-12-18 MED ORDER — VALACYCLOVIR HCL 500 MG PO TABS
1000.0000 mg | ORAL_TABLET | Freq: Every day | ORAL | Status: DC
  Administered 2011-12-18 – 2011-12-20 (×3): 1000 mg via ORAL
  Filled 2011-12-18 (×3): qty 2

## 2011-12-18 NOTE — Progress Notes (Addendum)
Patient was seen and evaluated earlier this morning by my associate.  Please refer to H and P for further details.   Will reassess patient tomorrow.  Obtain CMP and cbc next am.  Consider discontinuing amiodarone given elevated liver enzymes.  Jason [name]  Addendum:  Agree with discontinuing acetaminophen given patient's elevated liver enzymes.  As such will change hydrocodone-acetaminophen for pain management to low dose tramadol and adjust pain medication as needed.  Also will gently hydrate and check creatinine next am.

## 2011-12-18 NOTE — H&P (Signed)
[facility] History and Physical  Jason [name]:[id number] DOB: May 30, 1919 DOA: 12/17/2011  Referring physician: ED PCP: Leo Grosser, MD  Specialists: None  Chief Complaint: Fever  HPI: Jason [name] is a 78 y.o. male who presents with c/o fever onset suddenly this afternoon, after visiting with his son.  The patient has had a foley in place since a hospital admission last week for herpes zoster affecting his leg.  The patient had the foley placed for urinary retention secondary to BPH, had an appointment with his urologist in another day or so to have it removed / changed.  His symptoms are also associated with change in quality of his urine in the foley, notably it has become cloudy also has suprapubic tenderness.  In the ED the patient was found to have a leukocytosis, and a UTI was confirmed.  Rocephin and Vanc (vanc given that there was some concern last week at his PCPs office for thrombophlebitis in his arm) were ordered and hospitalist service was consulted to admit to inpatient.  Review of Systems: 12 systems reviewed and negative except as per HPI.  Past Medical History  Diagnosis Date  . PVD (peripheral vascular disease)   . CAD (coronary artery disease)     a. 12/2005 - PCI to OM  . Hyperlipidemia   . Cerebrovascular accident   . DVT femoral (deep venous thrombosis) with thrombophlebitis   . GERD (gastroesophageal reflux disease)   . Edema   . Anxiety   . Vertigo   . IBS (irritable bowel syndrome)   . Duodenitis   . Gastritis   . Esophageal stricture   . Cellulitis   . Pulmonary embolism   . Prostatic hypertrophy   . Hiatal hernia   . Frequent falls   . Subdural hematoma   . PTSD (post-traumatic stress disorder)   . Vertigo   . TIA (transient ischemic attack)     a. 01/2006 and 05/2006.    Past Surgical History  Procedure Date  . Hand surgery   . Angioplasty   . Carotid endarterectomy     Left  . Bare-metal stenting     left circumflex  .  Bilateral cataract surgery    Social History:  reports that he has never smoked. He does not have any smokeless tobacco history on file. He reports that he drinks alcohol. He reports that he does not use illicit drugs. Patient lives at home and despite his age performs virtually all ADLs including occasionally driving a car still (though his son doesn't like him to do this he states).  Allergies  Allergen Reactions  . Amoxicillin-Pot Clavulanate Nausea And Vomiting    Unknown-daughter states he can tolerate Amoxicillin ok  . Fludrocortisone Acetate Other (See Comments)    unknown  . Scopace (Scopolamine) Other (See Comments)    *patch Unknown reaction    Family History  Problem Relation Age of Onset  . Colon cancer Neg Hx   . Coronary artery disease Brother   . Heart attack Father     Prior to Admission medications   Medication Sig Start Date End Date Taking? Authorizing Provider  ALPRAZolam (XANAX) 0.25 MG tablet Take 0.25 mg by mouth at bedtime as needed. anxiety   Yes Historical Provider, MD  amiodarone (PACERONE) 200 MG tablet Take 2 tablets (400 mg total) by mouth 2 (two) times daily. Take 400 mg Twice daily for 13 more days, then take 200 mg daily. 12/09/11  Yes Shanker Levora Dredge, MD  aspirin 81 MG tablet Take 81 mg by mouth daily.     Yes Historical Provider, MD  Cholecalciferol (VITAMIN D3) 1000 UNITS CAPS Take 1 capsule by mouth daily.     Yes Historical Provider, MD  Docusate Calcium (STOOL SOFTENER PO) Take 1 capsule by mouth at bedtime.   Yes Historical Provider, MD  DULoxetine (CYMBALTA) 30 MG capsule Take 30 mg by mouth at bedtime.   Yes Historical Provider, MD  finasteride (PROSCAR) 5 MG tablet Take 1 tablet (5 mg total) by mouth daily. 12/09/11  Yes Shanker Levora Dredge, MD  gabapentin (NEURONTIN) 300 MG capsule Take 300 mg by mouth 2 (two) times daily.   Yes Historical Provider, MD  HYDROcodone-acetaminophen (NORCO) 5-325 MG per tablet Take 1 tablet by mouth every 6 (six)  hours as needed. Pain    Yes Historical Provider, MD  latanoprost (XALATAN) 0.005 % ophthalmic solution Place 1 drop into both eyes at bedtime.   Yes Historical Provider, MD  meclizine (ANTIVERT) 25 MG tablet Take 12.5 mg by mouth as needed. dizziness   Yes Historical Provider, MD  nitroGLYCERIN (NITROSTAT) 0.4 MG SL tablet Place 0.4 mg under the tongue every 5 (five) minutes as needed. Chest pain   Yes Historical Provider, MD  omeprazole (PRILOSEC) 20 MG capsule Take 20 mg by mouth 2 (two) times daily before a meal.    Yes Historical Provider, MD  polyethylene glycol powder (MIRALAX) powder Take 17 g by mouth at bedtime. Constipation    Yes Historical Provider, MD  promethazine (PHENERGAN) 25 MG tablet Take 25 mg by mouth every 6 (six) hours as needed. nausea   Yes Historical Provider, MD  Tamsulosin HCl (FLOMAX) 0.4 MG CAPS Take 0.4 mg by mouth daily.     Yes Historical Provider, MD  valACYclovir (VALTREX) 1000 MG tablet Take 1 tablet (1,000 mg total) by mouth daily. 12/09/11  Yes Shanker Levora Dredge, MD  zolpidem (AMBIEN) 10 MG tablet Take 5 mg by mouth at bedtime as needed. sleep   Yes Historical Provider, MD   Physical Exam: Filed Vitals:   12/17/11 2126 12/17/11 2350  BP: 141/75   Pulse: 100   Temp: 100.3 F (37.9 C)   TempSrc: Oral   Resp: 18   Height:  6' (1.829 m)  Weight:  79.379 kg (175 lb)  SpO2: 94%      General:  NAD, resting comfortably in hospital bed  Eyes: PEERLA EOMI  ENT: dry mucous membranes  Neck: supple w/o JVD  Cardiovascular: RRR w/o MRG  Respiratory: CTA B  Abdomen: soft, mild suprapubic tenderness, nd, bs+  Skin: no lesion nor rash  Musculoskeletal: MAE, full ROM all 4 extremities  Neurologic: AAOx3, grossly non-focal  Labs on Admission:  Basic Metabolic Panel:  Lab 12/17/11 1610  NA 135  K 3.7  CL 100  CO2 22  GLUCOSE 102*  BUN 24*  CREATININE 1.28  CALCIUM 9.1  MG --  PHOS --   Liver Function Tests:  Lab 12/17/11 2131  AST  309*  ALT 249*  ALKPHOS 116  BILITOT 0.5  PROT 7.3  ALBUMIN 3.0*   No results found for this basename: LIPASE:5,AMYLASE:5 in the last 168 hours No results found for this basename: AMMONIA:5 in the last 168 hours CBC:  Lab 12/17/11 2131  WBC 18.7*  NEUTROABS 17.0*  HGB 13.7  HCT 39.3  MCV 95.2  PLT 296   Cardiac Enzymes: No results found for this basename: CKTOTAL:5,CKMB:5,CKMBINDEX:5,TROPONINI:5 in the last 168 hours  BNP (last 3 results) No results found for this basename: PROBNP:3 in the last 8760 hours CBG: No results found for this basename: GLUCAP:5 in the last 168 hours  Radiological Exams on Admission: Dg Chest 2 View  12/17/2011  *RADIOLOGY REPORT*  Clinical Data: 78 year old male shortness of breath.  Atrial fibrillation.  CHEST - 2 VIEW  Comparison: 12/02/2011 and earlier.  Findings: Seated AP and lateral views of the chest.  Lower lung volumes and kyphotic view exaggerate the cardiac silhouette on the AP view. Other mediastinal contours are within normal limits. Visualized tracheal air column is within normal limits.  No pneumothorax, pulmonary edema, pleural effusion or acute pulmonary opacity.  Osteopenia.  IMPRESSION: No acute cardiopulmonary abnormality.   Original Report Authenticated By: Harley Hallmark, M.D.     EKG: Independently reviewed.  Assessment/Plan Principal Problem:  *Sepsis Active Problems:  PAF (paroxysmal atrial fibrillation)  UTI (lower urinary tract infection)  Transaminitis   1. Sepsis secondary to UTI - infected foley has been removed and a new foley has been placed, changing rocephin to cefepime given recent hospital stay to broaden coverage, will leave on vancomycin for now though would discontinue this if his urine comes back growing Gram negative organisms as I expect it will. 2. Paroxysmal A.Fib - not candidate for anticoagulation given falls, will leave patient on home meds including amiodarone 3. Transaminitis - likely secondary to  the amiodarone which was started last week (can cause this in 4-9% of patients), it seems most cases of this are asymptomatic, will leave patient on amiodarone for now but cards will likely need to review this and make recommendations for dose adjustments or discontinuation in the morning. 4. H/o HTN, dyslipidemia, CAD - continue current home meds, chronic and stable.  Code Status: Full Code (discussed with patient and son, I agree with them that this is appropriate despite age given his good baseline functional status) Family Communication: Spoke with son who is in room Disposition Plan: Admit to Obs  Time spent: 50 min  Marjon Doxtater M. [facility] Pager [phone]  If 7PM-7AM, please contact night-coverage [email or web address] Password [email or web address] 12/18/2011, 1:31 AM

## 2011-12-18 NOTE — ED Notes (Signed)
No changes, up to floor on monitor, report given to [place], Charity fundraiser, son at [facility].

## 2011-12-18 NOTE — Progress Notes (Signed)
Place ted hose on pt.  Jason [name], Charity fundraiser.

## 2011-12-18 NOTE — ED Notes (Addendum)
No changes, resting/sleeping, easily arousable, interactive, alert, calm, NAD,  (denies: pain, nausea or sob), family at [facility], updated, admission orders and bed assignment received, preparing to call report.

## 2011-12-18 NOTE — Progress Notes (Signed)
ANTIBIOTIC CONSULT NOTE - INITIAL  Pharmacy Consult for Vancomycin Indication: fevers  Allergies  Allergen Reactions  . Amoxicillin-Pot Clavulanate Nausea And Vomiting    Unknown-daughter states he can tolerate Amoxicillin ok  . Fludrocortisone Acetate Other (See Comments)    unknown  . Scopace (Scopolamine) Other (See Comments)    *patch Unknown reaction    Patient Measurements: Height: 6' (182.9 cm) Weight: 175 lb (79.379 kg) IBW/kg (Calculated) : 77.6   Vital Signs: Temp: 100.3 F (37.9 C) (10/06 2126) Temp src: Oral (10/06 2126) BP: 141/75 mmHg (10/06 2126) Pulse Rate: 100  (10/06 2126) Intake/Output from previous day:   Intake/Output from this shift:    Labs:  [facility] 12/17/11 2131  WBC 18.7*  HGB 13.7  PLT 296  LABCREA --  CREATININE 1.28   Estimated Creatinine Clearance: 42.1 ml/min (by C-G formula based on Cr of 1.28). No results found for this basename: VANCOTROUGH:2,VANCOPEAK:2,VANCORANDOM:2,GENTTROUGH:2,GENTPEAK:2,GENTRANDOM:2,TOBRATROUGH:2,TOBRAPEAK:2,TOBRARND:2,AMIKACINPEAK:2,AMIKACINTROU:2,AMIKACIN:2, in the last 72 hours   Microbiology: Recent Results (from the past 720 hour(s))  CULTURE, BLOOD (ROUTINE X 2)     Status: Normal   Collection Time   12/02/11  4:25 PM      Component Value Range Status Comment   Specimen Description BLOOD LEFT HAND   Final    Special Requests BOTTLES DRAWN AEROBIC ONLY 10CC   Final    Culture  Setup Time 12/03/2011 11:40   Final    Culture NO GROWTH 5 DAYS   Final    Report Status 12/09/2011 FINAL   Final   CULTURE, BLOOD (ROUTINE X 2)     Status: Normal   Collection Time   12/02/11  4:32 PM      Component Value Range Status Comment   Specimen Description BLOOD LEFT HAND   Final    Special Requests BOTTLES DRAWN AEROBIC ONLY   Final    Culture  Setup Time 12/03/2011 11:40   Final    Culture NO GROWTH 5 DAYS   Final    Report Status 12/09/2011 FINAL   Final   URINE CULTURE     Status: Normal   Collection  Time   12/02/11  5:19 PM      Component Value Range Status Comment   Specimen Description URINE, CLEAN CATCH   Final    Special Requests NONE   Final    Culture  Setup Time 12/03/2011 11:39   Final    Colony Count 85,000 COLONIES/ML   Final    Culture     Final    Value: Multiple bacterial morphotypes present, none predominant. Suggest appropriate recollection if clinically indicated.   Report Status 12/04/2011 FINAL   Final     Medical History: Past Medical History  Diagnosis Date  . PVD (peripheral vascular disease)   . CAD (coronary artery disease)     a. 12/2005 - PCI to OM  . Hyperlipidemia   . Cerebrovascular accident   . DVT femoral (deep venous thrombosis) with thrombophlebitis   . GERD (gastroesophageal reflux disease)   . Edema   . Anxiety   . Vertigo   . IBS (irritable bowel syndrome)   . Duodenitis   . Gastritis   . Esophageal stricture   . Cellulitis   . Pulmonary embolism   . Prostatic hypertrophy   . Hiatal hernia   . Frequent falls   . Subdural hematoma   . PTSD (post-traumatic stress disorder)   . Vertigo   . TIA (transient ischemic attack)     a.  01/2006 and 05/2006.     Medications:  Xanax  Amiodarone  ASA  Vitamin D  Cymbalta  Proscar  Neurontin  Prilosec  Miralax  Flomax  Valtrex  Ambien    Assessment: 78 yo male with fevers for empiric antibiotics  Goal of Therapy:  Vancomycin trough level 15-20 mcg/ml  Plan:  Vancomycin 1500 mg IV now, then 750 mg IV q24h  Judit Awad, Gary Fleet 12/18/2011,12:40 AM

## 2011-12-18 NOTE — Progress Notes (Signed)
Pt c/o lips quivering, unable to eat much.  Stated is something new.  Dr. Suzan Nailer informed and instructed he was going to  Review his med and informed cardiology.  Pt and daughter at the bedside informed informed that Dr. Suzan Nailer was notified.  Will continue to monitor.  Amanda Pea, Charity fundraiser.

## 2011-12-18 NOTE — Progress Notes (Signed)
Utilization Review Completed.  

## 2011-12-18 NOTE — Consult Note (Signed)
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  The patient had recently been hospitalized for herpes zoster (09/21-28) and urinary catheter was placed due to urinary retention from BPH. During this time, he developed PAF and was started on amiodarone. He developed hematuria and was felt to have an unsteady gait. Xarelto which had been previously initiated for recurrent DVTs was discontinued. IVC filter was placed. He followed up with Dr. Copper last week and was felt to be stable. ASA was continued.   He has developed fevers, chills, malaise, weakness and change in urinary quality since that time. He denies chest pain, palpitations, lightheadedness, orthopnea, LE edema, DOE or PND.  He was scheduled for urinary cath removal at urology today, however presented to [facility] ED prior to this appointment. There, he was found to be febrile with a leukocytosis and evidence of UTI. CMET was ordered revealing elevated AST/ALT at 309/249 (prior LFTs normal 09/29). Cath was changed, he was started on BS ABX and admitted. Initial trop-I returned WNL. EKG and telemetry indicate NSR with no ischemic changes. Cardiology was consulted for recommendations with amiodarone in the setting of transaminitis.   Problem List: Past Medical History  Diagnosis Date  . PVD (peripheral vascular disease)   . CAD (coronary artery disease)     a. 12/2005 - PCI to OM  . Hyperlipidemia   . Cerebrovascular accident   . DVT femoral (deep venous thrombosis) with  thrombophlebitis   . GERD (gastroesophageal reflux disease)   . Edema   . Anxiety   . Vertigo   . IBS (irritable bowel syndrome)   . Duodenitis   . Gastritis   . Esophageal stricture   . Cellulitis   . Pulmonary embolism   . Prostatic hypertrophy   . Frequent falls   . Subdural hematoma   . PTSD (post-traumatic stress disorder)   . Vertigo   . TIA (transient ischemic attack)     a. 01/2006 and 05/2006.   [name] Hiatal hernia     periferal neuropathy    Past Surgical History  Procedure Date  . Hand surgery   . Angioplasty   . Carotid endarterectomy     Left  . Bare-metal stenting     left circumflex  . Bilateral cataract surgery      Allergies:  Allergies  Allergen Reactions  . Amoxicillin-Pot Clavulanate Nausea And Vomiting    Unknown-daughter states he can tolerate Amoxicillin ok  . Fludrocortisone Acetate Other (See Comments)    unknown  . Scopace (Scopolamine) Other (See Comments)    *patch Unknown reaction    Home Medications: Prior to Admission medications   Medication Sig Start Date End Date Taking? Authorizing Provider  ALPRAZolam (XANAX) 0.25 MG tablet Take 0.25 mg by mouth at bedtime as needed. anxiety   Yes Historical Provider, MD  amiodarone (PACERONE) 200 MG tablet Take 2 tablets (400 mg total) by mouth 2 (two) times daily. Take 400 mg Twice daily for 13 more days, then take 200 mg daily. 12/09/11  Yes Shanker Levora Dredge,  MD  aspirin 81 MG tablet Take 81 mg by mouth daily.     Yes Historical Provider, MD  Cholecalciferol (VITAMIN D3) 1000 UNITS CAPS Take 1 capsule by mouth daily.     Yes Historical Provider, MD  Docusate Calcium (STOOL SOFTENER PO) Take 1 capsule by mouth at bedtime.   Yes Historical Provider, MD  DULoxetine (CYMBALTA) 30 MG capsule Take 30 mg by mouth at bedtime.   Yes Historical Provider, MD  finasteride (PROSCAR) 5 MG tablet Take 1 tablet (5 mg total) by mouth daily. 12/09/11  Yes Shanker Levora Dredge, MD  gabapentin (NEURONTIN) 300 MG capsule  Take 300 mg by mouth 2 (two) times daily.   Yes Historical Provider, MD  HYDROcodone-acetaminophen (NORCO) 5-325 MG per tablet Take 1 tablet by mouth every 6 (six) hours as needed. Pain    Yes Historical Provider, MD  latanoprost (XALATAN) 0.005 % ophthalmic solution Place 1 drop into both eyes at bedtime.   Yes Historical Provider, MD  meclizine (ANTIVERT) 25 MG tablet Take 12.5 mg by mouth as needed. dizziness   Yes Historical Provider, MD  nitroGLYCERIN (NITROSTAT) 0.4 MG SL tablet Place 0.4 mg under the tongue every 5 (five) minutes as needed. Chest pain   Yes Historical Provider, MD  omeprazole (PRILOSEC) 20 MG capsule Take 20 mg by mouth 2 (two) times daily before a meal.    Yes Historical Provider, MD  polyethylene glycol powder (MIRALAX) powder Take 17 g by mouth at bedtime. Constipation    Yes Historical Provider, MD  promethazine (PHENERGAN) 25 MG tablet Take 25 mg by mouth every 6 (six) hours as needed. nausea   Yes Historical Provider, MD  Tamsulosin HCl (FLOMAX) 0.4 MG CAPS Take 0.4 mg by mouth daily.     Yes Historical Provider, MD  valACYclovir (VALTREX) 1000 MG tablet Take 1 tablet (1,000 mg total) by mouth daily. 12/09/11  Yes Shanker Levora Dredge, MD  zolpidem (AMBIEN) 10 MG tablet Take 5 mg by mouth at bedtime as needed. sleep   Yes Historical Provider, MD    Inpatient Medications:     . sodium chloride   Intravenous Once  . amiodarone  400 mg Oral BID  . aspirin EC  81 mg Oral Daily  . ceFEPime (MAXIPIME) IV  1 g Intravenous Q12H  . ceFEPime (MAXIPIME) IV  1 g Intravenous Once  . cholecalciferol  1,000 Units Oral Daily  . DULoxetine  30 mg Oral QHS  . finasteride  5 mg Oral Daily  . gabapentin  300 mg Oral BID  . latanoprost  1 drop Both Eyes QHS  . ondansetron (ZOFRAN) IV  4 mg Intravenous Once  . pantoprazole  40 mg Oral Q1200  . polyethylene glycol  17 g Oral QHS  . sodium chloride  3 mL Intravenous Q12H  . Tamsulosin HCl  0.4 mg Oral Daily  . valACYclovir  1,000  mg Oral Daily  . vancomycin  1,500 mg Intravenous Once  . vancomycin  750 mg Intravenous Q24H  . DISCONTD: cefTRIAXone (ROCEPHIN) IVPB 1 gram/50 mL D5W  1 g Intravenous Once  . DISCONTD: polyethylene glycol powder  17 g Oral QHS  . DISCONTD: vancomycin  750 mg Intravenous QHS   Prescriptions prior to admission  Medication Sig Dispense Refill  . ALPRAZolam (XANAX) 0.25 MG tablet Take 0.25 mg by mouth at bedtime as needed. anxiety      . amiodarone (PACERONE) 200 MG tablet Take 2 tablets (400 mg total) by mouth 2 (two)  times daily. Take 400 mg Twice daily for 13 more days, then take 200 mg daily.  90 tablet  0  . aspirin 81 MG tablet Take 81 mg by mouth daily.        . Cholecalciferol (VITAMIN D3) 1000 UNITS CAPS Take 1 capsule by mouth daily.        Tery Sanfilippo Calcium (STOOL SOFTENER PO) Take 1 capsule by mouth at bedtime.      . DULoxetine (CYMBALTA) 30 MG capsule Take 30 mg by mouth at bedtime.      . finasteride (PROSCAR) 5 MG tablet Take 1 tablet (5 mg total) by mouth daily.  30 tablet  0  . gabapentin (NEURONTIN) 300 MG capsule Take 300 mg by mouth 2 (two) times daily.      [name] HYDROcodone-acetaminophen (NORCO) 5-325 MG per tablet Take 1 tablet by mouth every 6 (six) hours as needed. Pain       . latanoprost (XALATAN) 0.005 % ophthalmic solution Place 1 drop into both eyes at bedtime.      . meclizine (ANTIVERT) 25 MG tablet Take 12.5 mg by mouth as needed. dizziness      . nitroGLYCERIN (NITROSTAT) 0.4 MG SL tablet Place 0.4 mg under the tongue every 5 (five) minutes as needed. Chest pain      . omeprazole (PRILOSEC) 20 MG capsule Take 20 mg by mouth 2 (two) times daily before a meal.       . polyethylene glycol powder (MIRALAX) powder Take 17 g by mouth at bedtime. Constipation       . promethazine (PHENERGAN) 25 MG tablet Take 25 mg by mouth every 6 (six) hours as needed. nausea      . Tamsulosin HCl (FLOMAX) 0.4 MG CAPS Take 0.4 mg by mouth daily.        . valACYclovir (VALTREX) 1000  MG tablet Take 1 tablet (1,000 mg total) by mouth daily.  3 tablet  0  . zolpidem (AMBIEN) 10 MG tablet Take 5 mg by mouth at bedtime as needed. sleep        Family History  Problem Relation Age of Onset  . Colon cancer Neg Hx   . Coronary artery disease Brother   . Heart attack Father      History   Social History  . Marital Status: Married    Spouse Name: N/A    Number of Children: N/A  . Years of Education: N/A   Occupational History  . Not on file.   Social History Main Topics  . Smoking status: Never Smoker   . Smokeless tobacco: Never Used  . Alcohol Use: Yes     Wine occasional  . Drug Use: No  . Sexually Active: No   Other Topics Concern  . 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  Cardiovascular: negative for chest pain, dyspnea on exertion, edema, orthopnea, palpitations, paroxysmal nocturnal dyspnea or shortness of breath Dermatological: negative for rash Respiratory: negative for cough or wheezing Urologic: positive for hematuria, urinary changes Abdominal: negative for nausea, vomiting, diarrhea, bright red blood per rectum, melena, or hematemesis Neurologic: positive for tremors, negative for visual changes, syncope, or dizziness All other systems reviewed and are otherwise negative except as noted above.  Physical Exam: Blood pressure 109/72, pulse 72, temperature 98.3 F (36.8 C), temperature source Oral, resp.  rate 18, height 6' (1.829 m), weight 80.377 kg (177 lb 3.2 oz), SpO2 97.00%.    General: Elderly, well developed, well nourished, in no acute distress. Head: Normocephalic, atraumatic, sclera non-icteric, no xanthomas, nares are without discharge.  Neck: Carotid bruits 2+ R>L. JVD not elevated. Lungs: Clear bilaterally to auscultation without wheezes, rales, or rhonchi. Breathing is  unlabored. Heart: RRR with S1 S2. No murmurs, rubs, or gallops appreciated. Abdomen: Tenderness on palpation to RLQ, RUQ and suprapubic region, non-distended with normoactive bowel sounds. No hepatomegaly. No rebound/guarding. No obvious abdominal masses. Msk:  Strength and tone appears normal for age. Extremities: No clubbing, cyanosis or edema.  Distal pedal pulses are 2+ and equal bilaterally. Neuro: Resting mandibular tremor appreciated, Alert and oriented X 3. Moves all extremities spontaneously. Psych:  Responds to questions appropriately with a normal affect.  Labs: Recent Labs  [facility] 12/18/11 0520 12/17/11 2131   WBC 31.4* 18.7*   HGB 12.6* 13.7   HCT 37.3* 39.3   MCV 96.6 95.2   PLT 269 296   Lab 12/18/11 0520 12/17/11 2131  NA 135 135  K 4.4 3.7  CL 101 100  CO2 23 22  BUN 25* 24*  CREATININE 1.45* 1.28  CALCIUM 8.7 9.1  PROT -- 7.3  BILITOT -- 0.5  ALKPHOS -- 116  ALT -- 249*  AST -- 309*  AMYLASE -- --  LIPASE -- --  GLUCOSE 72 102*   Radiology/Studies:   Dg Chest 2 View  12/17/2011  *RADIOLOGY REPORT*  Clinical Data: 78 year old male shortness of breath.  Atrial fibrillation.  CHEST - 2 VIEW  Comparison: 12/02/2011 and earlier.  Findings: Seated AP and lateral views of the chest.  Lower lung volumes and kyphotic view exaggerate the cardiac silhouette on the AP view. Other mediastinal contours are within normal limits. Visualized tracheal air column is within normal limits.  No pneumothorax, pulmonary edema, pleural effusion or acute pulmonary opacity.  Osteopenia.  IMPRESSION: No acute cardiopulmonary abnormality.   Original Report Authenticated By: Ulla Potash III, M.D.    EKG 10/6 2125: NSR, RBBB, TWIs V1-V3, II, III unchanged from prior tracings, no ST/T wave changes  ASSESSMENT:   1. Urosepsis 2. Transaminitis 3. PAF 4. Recurrent DVT 5. CAD 6. PVD 7. HL  DISCUSSION/PLAN:  Patient admitted with urosepsis secondary to indwelling urinary catheter.  Urinary catheters changed. He has been covered with BS ABX. LFTs have notably risen since initiation of amiodarone on previous admission for his he developed PAF. He has been maintaining NSR on this, but LFTs are increased from prior results obtained on 9/29. Will hold for suspicion of amiodarone-induced hepatotoxicity. Transaminitis may also occur with sepsis. Will need to continue to monitor rhythm off amiodarone in the setting of infection. Will address should he convert to a-fib and require rate-control. Continue full-dose ASA. No ischemic, arrhythmic or heart-failure symptoms endorsed during this time. No acute objective cardiac abnormality identified. Will continue to monitor. Given RUQ abdominal tenderness on exam, will also order abd u/s. Consider adjusting PRN analgesic to exclude acetaminophen.   Signed, R. Hurman Horn, PA-C 12/18/2011, 2:08 PM  As above, patient seen and examined. Briefly 78 year old male with past medical history of coronary artery disease, prior DVT/pulmonary embolus, prior CVA for evaluation of transaminitis and atrial fibrillation on amiodarone therapy. Patient recently hospitalized for herpes zoster and cellulitis. During that admission he developed atrial fibrillation and was placed on amiodarone. He did have hematuria and decision was made to discontinue anticoagulation. Patient also had  a Foley catheter inserted because of obstructive uropathy and was discharged with this. The patient developed fevers, chills and weakness last evening. He was admitted today with urosepsis and placed on antibiotics. It was noted that his liver functions were elevated with an SGOT of 309 and an SGPT of 249. He also has had some facial tremors. On exam there is mild abdominal tenderness. Cardiology is asked to evaluate for discontinuation of amiodarone. The patient is in sinus rhythm. I think it would be reasonable to hold amiodarone for now as this certainly could be contributing to his  elevated liver functions. There may also be a contribution from sepsis. Finally he has some abdominal pain with palpation. Check ultrasound. We will follow. Olga Millers 3:42 PM

## 2011-12-19 DIAGNOSIS — F411 Generalized anxiety disorder: Secondary | ICD-10-CM

## 2011-12-19 DIAGNOSIS — K219 Gastro-esophageal reflux disease without esophagitis: Secondary | ICD-10-CM

## 2011-12-19 DIAGNOSIS — A419 Sepsis, unspecified organism: Secondary | ICD-10-CM

## 2011-12-19 DIAGNOSIS — I10 Essential (primary) hypertension: Secondary | ICD-10-CM

## 2011-12-19 LAB — CBC
Hemoglobin: 11.2 g/dL — ABNORMAL LOW (ref 13.0–17.0)
MCH: 32.5 pg (ref 26.0–34.0)
MCHC: 33.3 g/dL (ref 30.0–36.0)
Platelets: 230 10*3/uL (ref 150–400)

## 2011-12-19 LAB — COMPREHENSIVE METABOLIC PANEL
ALT: 132 U/L — ABNORMAL HIGH (ref 0–53)
AST: 56 U/L — ABNORMAL HIGH (ref 0–37)
CO2: 23 mEq/L (ref 19–32)
Chloride: 103 mEq/L (ref 96–112)
GFR calc non Af Amer: 39 mL/min — ABNORMAL LOW (ref >90)
Sodium: 135 mEq/L (ref 135–145)
Total Bilirubin: 0.6 mg/dL (ref 0.3–1.2)

## 2011-12-19 LAB — GLUCOSE, CAPILLARY: Glucose-Capillary: 113 mg/dL — ABNORMAL HIGH (ref 70–99)

## 2011-12-19 NOTE — Progress Notes (Addendum)
[facility] PROGRESS NOTE  SHEPARD KELTZ ZOX:[id number] DOB: 12/05/1919 DOA: 12/17/2011 PCP: Leo Grosser, MD  Assessment/Plan: 1. Transaminitis - Likely due to Amiodarone.  At this point cardiology holding medication.  Will defer any rate controlling medication to cardiology at this juncture.  2. UTI: - Urine cultures growing Pseudomonas which should be well covered by cefepime.  Will d/c Vancomycin.   - check cbc next am. - Once sensitivities resulted will go ahead and further taylor antibiotic regimen  3. Sepsis - Resolving on IV antibiotics - Most likely from urinary source - Patient is afebrile and WBC trending down  4. PAF - Per cardiology at this juncture  5. Anxiety: -Stable will continue duloxetine  6. Herpes Zoster - Will continue valacyclovir. Improving  7. GERD - stable on protonix  Code Status: full Family Communication: No family at bedside Disposition Plan: Likely home in 1-2 days. Pending continue improvement in clinical condition   Consultants:  Cardiology: Dr. Excell Seltzer  Procedures:  none  Antibiotics:  Cefepime day # 3  Vancomycin d/c'd 10/08  HPI/Subjective: Patient has no new complaints today.  States that he feels better.  The lip/jaw tremor has ceased.  No acute issues overnight reported.  Objective: Filed Vitals:   12/18/11 0515 12/18/11 1440 12/18/11 2035 12/19/11 0342  BP: 109/72 116/58 148/47 159/74  Pulse: 72 67 74 73  Temp: 98.3 F (36.8 C) 98.3 F (36.8 C) 99.3 F (37.4 C) 98.9 F (37.2 C)  TempSrc: Oral Oral Oral Oral  Resp: 18 18 19 19   Height:      Weight:    76.7 kg (169 lb 1.5 oz)  SpO2: 97%  93% 91%    Intake/Output Summary (Last 24 hours) at 12/19/11 1408 Last data filed at 12/19/11 1000  Gross per 24 hour  Intake 1217.5 ml  Output   1501 ml  Net -283.5 ml   Filed Weights   12/17/11 2350 12/18/11 0257 12/19/11 0342  Weight: 79.379 kg (175 lb) 80.377 kg (177 lb 3.2 oz) 76.7 kg (169 lb 1.5 oz)     Exam:   General:  Pt in NAD, Alert and Awake  Cardiovascular: normal S1 and S2, no gallops or rubs  Respiratory: CTA BL, no wheezes  Abdomen: Soft, NT, ND  Data Reviewed: Basic Metabolic Panel:  Lab 12/19/11 8119 12/18/11 0520 12/17/11 2131  NA 135 135 135  K 4.1 4.4 3.7  CL 103 101 100  CO2 23 23 22   GLUCOSE 84 72 102*  BUN 24* 25* 24*  CREATININE 1.52* 1.45* 1.28  CALCIUM 8.3* 8.7 9.1  MG -- -- --  PHOS -- -- --   Liver Function Tests:  Lab 12/19/11 0555 12/17/11 2131  AST 56* 309*  ALT 132* 249*  ALKPHOS 102 116  BILITOT 0.6 0.5  PROT 5.8* 7.3  ALBUMIN 2.1* 3.0*   No results found for this basename: LIPASE:5,AMYLASE:5 in the last 168 hours No results found for this basename: AMMONIA:5 in the last 168 hours CBC:  Lab 12/19/11 0555 12/18/11 0520 12/17/11 2131  WBC 17.6* 31.4* 18.7*  NEUTROABS -- -- 17.0*  HGB 11.2* 12.6* 13.7  HCT 33.6* 37.3* 39.3  MCV 97.4 96.6 95.2  PLT 230 269 296   Cardiac Enzymes: No results found for this basename: CKTOTAL:5,CKMB:5,CKMBINDEX:5,TROPONINI:5 in the last 168 hours BNP (last 3 results) No results found for this basename: PROBNP:3 in the last 8760 hours CBG:  Lab 12/19/11 1159  GLUCAP 113*    Recent Results (from the  past 240 hour(s))  URINE CULTURE     Status: Normal (Preliminary result)   Collection Time   12/18/11  2:01 AM      Component Value Range Status Comment   Specimen Description URINE, CATHETERIZED   Final    Special Requests NONE   Final    Culture  Setup Time 12/18/2011 12:33   Final    Colony Count >=100,000 COLONIES/ML   Final    Culture PSEUDOMONAS AERUGINOSA   Final    Report Status PENDING   Incomplete      Studies: Dg Chest 2 View  12/17/2011  *RADIOLOGY REPORT*  Clinical Data: 78 year old male shortness of breath.  Atrial fibrillation.  CHEST - 2 VIEW  Comparison: 12/02/2011 and earlier.  Findings: Seated AP and lateral views of the chest.  Lower lung volumes and kyphotic view  exaggerate the cardiac silhouette on the AP view. Other mediastinal contours are within normal limits. Visualized tracheal air column is within normal limits.  No pneumothorax, pulmonary edema, pleural effusion or acute pulmonary opacity.  Osteopenia.  IMPRESSION: No acute cardiopulmonary abnormality.   Original Report Authenticated By: Harley Hallmark, M.D.    US Abdomen Complete  12/18/2011  *RADIOLOGY REPORT*  Clinical Data:  78 year old male right upper quadrant pain. Abnormal LFTs.  COMPLETE ABDOMINAL ULTRASOUND  Comparison:  CT abdomen and pelvis 12/02/2011.  Findings:  Gallbladder:  No shadowing echogenic stones.  No definite sludge. Wall thickness within normal limits at 2 mm.  No sonographic Murphy's sign elicited.  No pericholecystic fluid.  Common bile duct:  Within normal limits at 5 mm diameter.  Liver:  Echogenicity appears decreased, with a "starry sky" appearance (image 16).  No focal liver lesion. No intrahepatic biliary ductal dilatation.  No perihepatic fluid.  IVC:  Incompletely visualized due to overlying bowel gas, visualized portions within normal limits.  Pancreas:  Not visualized due to overlying bowel gas.  Spleen:  Within normal limits on.  6.4 cm in length.  Right Kidney:  Stable extrarenal pelvis.  No dilated intrarenal collecting system.  Length 10.6 cm.  Left Kidney:  No hydronephrosis.  Renal length 11.0 cm.  Abdominal aorta:  Not visualized due to overlying bowel gas.  IMPRESSION: 1.  Cannot exclude hepatic edema such as with acute hepatitis.  No focal liver lesion.  No biliary ductal dilatation. 2.  Gallbladder within normal limits.   Original Report Authenticated By: Harley Hallmark, M.D.     Scheduled Meds:   . aspirin EC  81 mg Oral Daily  . ceFEPime (MAXIPIME) IV  1 g Intravenous Q12H  . cholecalciferol  1,000 Units Oral Daily  . DULoxetine  30 mg Oral QHS  . finasteride  5 mg Oral Daily  . gabapentin  300 mg Oral BID  . latanoprost  1 drop Both Eyes QHS  .  pantoprazole  40 mg Oral Q1200  . polyethylene glycol  17 g Oral QHS  . sodium chloride  3 mL Intravenous Q12H  . Tamsulosin HCl  0.4 mg Oral Daily  . valACYclovir  1,000 mg Oral Daily  . vancomycin  750 mg Intravenous Q24H  . DISCONTD: amiodarone  400 mg Oral BID   Continuous Infusions:   . sodium chloride 75 mL/hr at 12/18/11 1802    Principal Problem:  *Sepsis Active Problems:  PAF (paroxysmal atrial fibrillation)  UTI (lower urinary tract infection)  Transaminitis    Time spent: > 35 minutes.    Penny Pia  [facility] Pager [phone].  If 8PM-8AM, please contact night-coverage at [email or web address], password [facility] 12/19/2011, 2:08 PM  LOS: 2 days     Addendum Admitted with Sepsis and UTI  Discharged home following previous admission with foley catheter in place.  Foley left in place due to urinary retention. Urine CX - PSEUDOMONAS AERUGINOSA   Urosepsis thought to be a complication of foley catheter.  Initially foley catheter replaced.  Patient was discharged on antibiotic with appropriate coverage and follow up with pcp the next day.

## 2011-12-19 NOTE — Progress Notes (Signed)
RN noted during assessment, right foot warm to touch and more red than left foot.  No C/O of pain.  MD notified and no new orders placed. Patient family at bedside.  Patient appears in no acute distress. RN will continue to monitor. Louretta Parma, RN

## 2011-12-19 NOTE — ED Provider Notes (Signed)
History     CSN: [id number]  Arrival date & time 12/17/11  2120   First MD Initiated Contact with Patient 12/17/11 2151      Chief Complaint  Patient presents with  . Shortness of Breath  . Fever    (Consider location/radiation/quality/duration/timing/severity/associated sxs/prior treatment) HPI Comments: Jason [name] 78 y.o. male   The chief complaint is: Patient presents with:  urinary changes   Shortness of Breath   Fever    78 yo with  Presents to ED with above cc.  Children give hx.  Recently admitted and d/c after cellulits secondary to zoster infection and foley cath placement due to urinary retention from BPH. Children state that patient devleoped blood in urine and "white chunks" visible in urine bag.  Patient also has SOB and Fever but denies CP, N/V/D, cough.  C/O suprapubic abdominal pain. Denies flank pain, chills, myalgias, arthralgias   Patient is a 78 y.o. male presenting with shortness of breath and fever. The history is provided by the patient and a relative. No language interpreter was used.  Shortness of Breath  Associated symptoms include a fever and shortness of breath. Pertinent negatives include no chest pain, no stridor, no cough and no wheezing.  Fever Primary symptoms of the febrile illness include fever, fatigue, shortness of breath and abdominal pain (suprapubic). Primary symptoms do not include headaches, cough, wheezing, nausea, vomiting, diarrhea, myalgias, arthralgias or rash.    Past Medical History  Diagnosis Date  . PVD (peripheral vascular disease)   . CAD (coronary artery disease)     a. 12/2005 - PCI to OM  . Hyperlipidemia   . Cerebrovascular accident   . DVT femoral (deep venous thrombosis) with thrombophlebitis   . GERD (gastroesophageal reflux disease)   . Edema   . Anxiety   . Vertigo   . IBS (irritable bowel syndrome)   . Duodenitis   . Gastritis   . Esophageal stricture   . Cellulitis   . Pulmonary embolism   .  Prostatic hypertrophy   . Frequent falls   . Subdural hematoma   . PTSD (post-traumatic stress disorder)   . Vertigo   . TIA (transient ischemic attack)     a. 01/2006 and 05/2006.   [name] Hiatal hernia     periferal neuropathy    Past Surgical History  Procedure Date  . Hand surgery   . Angioplasty   . Carotid endarterectomy     Left  . Bare-metal stenting     left circumflex  . Bilateral cataract surgery     Family History  Problem Relation Age of Onset  . Colon cancer Neg Hx   . Coronary artery disease Brother   . Heart attack Father     History  Substance Use Topics  . Smoking status: Never Smoker   . Smokeless tobacco: Never Used  . Alcohol Use: Yes     Wine occasional      Review of Systems  Constitutional: Positive for fever, activity change, appetite change and fatigue. Negative for chills, diaphoresis and unexpected weight change.  HENT: Negative for trouble swallowing.   Respiratory: Positive for shortness of breath. Negative for apnea, cough, choking, chest tightness, wheezing and stridor.   Cardiovascular: Negative for chest pain, palpitations and leg swelling.  Gastrointestinal: Positive for abdominal pain (suprapubic). Negative for nausea, vomiting, diarrhea and constipation.  Genitourinary: Positive for hematuria. Negative for flank pain.  Musculoskeletal: Negative for myalgias, back pain, joint swelling, arthralgias and gait problem.  Skin: Negative for rash.  Neurological: Negative for light-headedness and headaches.  Psychiatric/Behavioral: Negative for behavioral problems and confusion.    Allergies  Amoxicillin-pot clavulanate; Fludrocortisone acetate; and Scopace  Home Medications  No current outpatient prescriptions on file.  BP 159/74  Pulse 73  Temp 98.9 F (37.2 C) (Oral)  Resp 19  Ht 6' (1.829 m)  Wt 169 lb 1.5 oz (76.7 kg)  BMI 22.93 kg/m2  SpO2 91%  Physical Exam  Nursing note and vitals reviewed. Constitutional: He is  oriented to person, place, and time. He appears well-developed and well-nourished. He is cooperative. He is easily aroused. He appears ill. No distress.       Patient appears distressed, He is responsive but answers question only when directly addressed.  HENT:  Head: Normocephalic and atraumatic.  Eyes: Conjunctivae normal are normal. No scleral icterus.  Neck: Normal range of motion. Neck supple.  Cardiovascular: Normal rate, regular rhythm, normal heart sounds and intact distal pulses.   No murmur heard. Pulmonary/Chest: Effort normal and breath sounds normal. No respiratory distress. He has no wheezes. He has no rales.  Abdominal: Soft. Bowel sounds are normal. He exhibits no distension. There is tenderness (suprapubic). There is no rebound and no guarding.  Musculoskeletal: He exhibits no edema.  Neurological: He is oriented to person, place, and time and easily aroused.  Skin: Skin is warm and dry. He is not diaphoretic.  Psychiatric: His behavior is normal.    ED Course  Procedures (including critical care time)  Labs Reviewed  CBC WITH DIFFERENTIAL - Abnormal; Notable for the following:    WBC 18.7 (*)     RBC 4.13 (*)     Neutrophils Relative 91 (*)     Neutro Abs 17.0 (*)     Lymphocytes Relative 6 (*)     All other components within normal limits  COMPREHENSIVE METABOLIC PANEL - Abnormal; Notable for the following:    Glucose, Bld 102 (*)     BUN 24 (*)     Albumin 3.0 (*)     AST 309 (*)     ALT 249 (*)     GFR calc non Af Amer 48 (*)     GFR calc Af Amer 55 (*)     All other components within normal limits  URINALYSIS, ROUTINE W REFLEX MICROSCOPIC - Abnormal; Notable for the following:    APPearance TURBID (*)     Hgb urine dipstick LARGE (*)     Nitrite POSITIVE (*)     Leukocytes, UA LARGE (*)     All other components within normal limits  URINE MICROSCOPIC-ADD ON - Abnormal; Notable for the following:    Bacteria, UA MANY (*)     Crystals CA OXALATE  CRYSTALS (*)     All other components within normal limits  CBC - Abnormal; Notable for the following:    WBC 31.4 (*)     RBC 3.86 (*)     Hemoglobin 12.6 (*)     HCT 37.3 (*)     All other components within normal limits  BASIC METABOLIC PANEL - Abnormal; Notable for the following:    BUN 25 (*)     Creatinine, Ser 1.45 (*)     GFR calc non Af Amer 41 (*)     GFR calc Af Amer 47 (*)     All other components within normal limits  COMPREHENSIVE METABOLIC PANEL - Abnormal; Notable for the following:    BUN 24 (*)  Creatinine, Ser 1.52 (*)     Calcium 8.3 (*)     Total Protein 5.8 (*)     Albumin 2.1 (*)     AST 56 (*)     ALT 132 (*)     GFR calc non Af Amer 39 (*)     GFR calc Af Amer 45 (*)     All other components within normal limits  CBC - Abnormal; Notable for the following:    WBC 17.6 (*)     RBC 3.45 (*)     Hemoglobin 11.2 (*)     HCT 33.6 (*)     All other components within normal limits  URINE CULTURE  POCT I-STAT TROPONIN I  HEPATITIS PANEL, ACUTE   Dg Chest 2 View  12/17/2011  *RADIOLOGY REPORT*  Clinical Data: 78 year old male shortness of breath.  Atrial fibrillation.  CHEST - 2 VIEW  Comparison: 12/02/2011 and earlier.  Findings: Seated AP and lateral views of the chest.  Lower lung volumes and kyphotic view exaggerate the cardiac silhouette on the AP view. Other mediastinal contours are within normal limits. Visualized tracheal air column is within normal limits.  No pneumothorax, pulmonary edema, pleural effusion or acute pulmonary opacity.  Osteopenia.  IMPRESSION: No acute cardiopulmonary abnormality.   Original Report Authenticated By: Harley Hallmark, M.D.    US Abdomen Complete  12/18/2011  *RADIOLOGY REPORT*  Clinical Data:  78 year old male right upper quadrant pain. Abnormal LFTs.  COMPLETE ABDOMINAL ULTRASOUND  Comparison:  CT abdomen and pelvis 12/02/2011.  Findings:  Gallbladder:  No shadowing echogenic stones.  No definite sludge. Wall thickness  within normal limits at 2 mm.  No sonographic Murphy's sign elicited.  No pericholecystic fluid.  Common bile duct:  Within normal limits at 5 mm diameter.  Liver:  Echogenicity appears decreased, with a "starry sky" appearance (image 16).  No focal liver lesion. No intrahepatic biliary ductal dilatation.  No perihepatic fluid.  IVC:  Incompletely visualized due to overlying bowel gas, visualized portions within normal limits.  Pancreas:  Not visualized due to overlying bowel gas.  Spleen:  Within normal limits on.  6.4 cm in length.  Right Kidney:  Stable extrarenal pelvis.  No dilated intrarenal collecting system.  Length 10.6 cm.  Left Kidney:  No hydronephrosis.  Renal length 11.0 cm.  Abdominal aorta:  Not visualized due to overlying bowel gas.  IMPRESSION: 1.  Cannot exclude hepatic edema such as with acute hepatitis.  No focal liver lesion.  No biliary ductal dilatation. 2.  Gallbladder within normal limits.   Original Report Authenticated By: Harley Hallmark, M.D.     Date: 12/19/2011  Rate: 100  Rhythm: normal sinus rhythm  QRS Axis: normal  Intervals: normal  ST/T Wave abnormalities: old anterior infarct  Conduction Disutrbances:right bundle branch block  Narrative Interpretation: abnormal EKG  Old EKG Reviewed: unchanged    1. UTI (lower urinary tract infection)   2. Essential hypertension, benign   3. PAF (paroxysmal atrial fibrillation)   4. Sepsis   5. Transaminitis       MDM  Patient with fever, tahycardia.  Patient with confirmed UTI. Ekg and troponin negative. Patient started on Vancomycin and rocephin.  Catheter changed.  I have consulted with Dr. Julian Reil who has agreed to admit the patient.         Arthor Captain, PA-C 12/19/11 [phone]

## 2011-12-19 NOTE — Clinical Documentation Improvement (Signed)
GENERIC DOCUMENTATION CLARIFICATION QUERY  THIS DOCUMENT IS NOT A PERMANENT PART OF THE MEDICAL RECORD    Please update your documentation within the medical record to reflect your response to this query.                                                                                         12/19/11   Dr. Cena Benton and/or Associates,  In a better effort to capture your patient's severity of illness, reflect appropriate length of stay and utilization of resources, a review of the patient medical record has revealed the following indicators:  Admitted with Sepsis and UTI  Discharged home following previous admission with foley catheter in place.   Urine CX - PSEUDOMONAS AERUGINOSA    Based on your clinical judgment, please document in the progress notes and discharge summary if the patient's Sepsis and UTI are:   - Are a Complication Secondary to the indwelling urinary catheter   - Are Not a Complication Secondary to the indwelling urinary catheter   - Unable to Clinically Determine   In responding to this query please exercise your independent judgment.    The fact that a query is asked, does not imply that any particular answer is desired or expected.   Reviewed: additional documentation in the medical record  Thank You,  Jerral Ralph  RN BSN CCDS Certified Clinical Documentation Specialist: Cell   [phone]  Health Information Management [place]  TO RESPOND TO THE THIS QUERY, FOLLOW THE INSTRUCTIONS BELOW:  1. If needed, update documentation for the patient's encounter via the notes activity.  2. Access this query again and click edit on the In [employer].  3. After updating, or not, click F2 to complete all highlighted (required) fields concerning your review. Select "additional documentation in the medical record" OR "no additional documentation provided".  4. Click Sign note button.  5. The deficiency will fall out of your In Basket *Please let us  know if you are not able to complete this workflow by phone or e-mail (listed below).

## 2011-12-19 NOTE — Progress Notes (Signed)
Patient Name: Jason [name] Date of Encounter: 12/19/2011     Principal Problem:  *Sepsis Active Problems:  PAF (paroxysmal atrial fibrillation)  UTI (lower urinary tract infection)  Transaminitis    SUBJECTIVE: Feels well this AM. Improved from yesterday. Notes mouth tremors are gone. No chills, rigors, chest pain, palpitations, shortness of breath or lightheadedness.    OBJECTIVE  Filed Vitals:   12/18/11 0515 12/18/11 1440 12/18/11 2035 12/19/11 0342  BP: 109/72 116/58 148/47 159/74  Pulse: 72 67 74 73  Temp: 98.3 F (36.8 C) 98.3 F (36.8 C) 99.3 F (37.4 C) 98.9 F (37.2 C)  TempSrc: Oral Oral Oral Oral  Resp: 18 18 19 19   Height:      Weight:    76.7 kg (169 lb 1.5 oz)  SpO2: 97%  93% 91%    Intake/Output Summary (Last 24 hours) at 12/19/11 0919 Last data filed at 12/19/11 0851  Gross per 24 hour  Intake 1397.5 ml  Output   1202 ml  Net  195.5 ml   Weight change: -2.679 kg (-5 lb 14.5 oz)  PHYSICAL EXAM  General: Elderly, well nourished, in no acute distress. Head: Normocephalic, atraumatic, sclera non-icteric, no xanthomas, nares are without discharge.  Neck: Supple without bruits or JVD. Lungs:  Resp regular and unlabored, CTAB without rales, wheezes or rhonchi Heart: RRR, clear S1, S2, + S3 no s4, or murmurs. Abdomen: Soft, non-tender on palpation, non-distended, BS + x 4.  Msk:  Strength and tone appears normal for age. Extremities: No clubbing, cyanosis or edema. DP/PT/Radials 2+ and equal bilaterally. Neuro: Alert and oriented X 3. Moves all extremities spontaneously. Psych: Normal affect.  LABS:  Recent Labs  [facility] 12/19/11 0555 12/18/11 0520   WBC 17.6* 31.4*   HGB 11.2* 12.6*   HCT 33.6* 37.3*   MCV 97.4 96.6   PLT 230 269   Lab 12/19/11 0555 12/18/11 0520 12/17/11 2131  NA 135 135 135  K 4.1 4.4 3.7  CL 103 101 100  CO2 23 23 22   BUN 24* 25* 24*  CREATININE 1.52* 1.45* 1.28  CALCIUM 8.3* 8.7 9.1  PROT 5.8* -- --  BILITOT  0.6 -- --  ALKPHOS 102 -- --  ALT 132* -- --  AST 56* -- --  AMYLASE -- -- --  LIPASE -- -- --  GLUCOSE 84 72 102*    TELE: NSR, 60- 80 bpm   Radiology/Studies:  Ct Abdomen Pelvis Wo Contrast  12/03/2011  *RADIOLOGY REPORT*  Clinical Data: Abdominal pain  CT ABDOMEN AND PELVIS WITHOUT CONTRAST  Technique:  Multidetector CT imaging of the abdomen and pelvis was performed following the standard protocol without intravenous contrast.  Comparison: 04/25/2011  Findings: Probable dependent atelectasis in the bilateral lung bases.  Small hiatal hernia.  Unenhanced liver, spleen, pancreas, and adrenal glands are within normal limits.  Gallbladder is unremarkable.  No intrahepatic or extrahepatic ductal dilatation.  At least two punctate nonobstructing right renal calculi. Bilateral renal sinus cysts.  Extrarenal pelves.  No hydronephrosis.  No evidence of bowel obstruction.  Normal appendix.  Atherosclerotic calcifications of the abdominal aorta and branch vessels.  No abdominopelvic ascites.  No suspicious abdominopelvic lymphadenopathy.  Prostate is unremarkable.  No ureteral or bladder calculi.  Small fat-containing left inguinal hernia.  Degenerative changes of the visualized thoracolumbar spine.  IMPRESSION: No evidence of bowel obstruction.  Normal appendix.  Two punctate nonobstructing right renal calculi.  No ureteral or bladder calculi.  No hydronephrosis.  No  CT findings to account for the patient's abdominal pain.   Original Report Authenticated By: Charline Bills, M.D.    Dg Chest 2 View  12/17/2011  *RADIOLOGY REPORT*  Clinical Data: 78 year old male shortness of breath.  Atrial fibrillation.  CHEST - 2 VIEW  Comparison: 12/02/2011 and earlier.  Findings: Seated AP and lateral views of the chest.  Lower lung volumes and kyphotic view exaggerate the cardiac silhouette on the AP view. Other mediastinal contours are within normal limits. Visualized tracheal air column is within normal limits.   No pneumothorax, pulmonary edema, pleural effusion or acute pulmonary opacity.  Osteopenia.  IMPRESSION: No acute cardiopulmonary abnormality.   Original Report Authenticated By: Harley Hallmark, M.D.    US Abdomen Complete  12/18/2011  *RADIOLOGY REPORT*  Clinical Data:  78 year old male right upper quadrant pain. Abnormal LFTs.  COMPLETE ABDOMINAL ULTRASOUND  Comparison:  CT abdomen and pelvis 12/02/2011.  Findings:  Gallbladder:  No shadowing echogenic stones.  No definite sludge. Wall thickness within normal limits at 2 mm.  No sonographic Murphy's sign elicited.  No pericholecystic fluid.  Common bile duct:  Within normal limits at 5 mm diameter.  Liver:  Echogenicity appears decreased, with a "starry sky" appearance (image 16).  No focal liver lesion. No intrahepatic biliary ductal dilatation.  No perihepatic fluid.  IVC:  Incompletely visualized due to overlying bowel gas, visualized portions within normal limits.  Pancreas:  Not visualized due to overlying bowel gas.  Spleen:  Within normal limits on.  6.4 cm in length.  Right Kidney:  Stable extrarenal pelvis.  No dilated intrarenal collecting system.  Length 10.6 cm.  Left Kidney:  No hydronephrosis.  Renal length 11.0 cm.  Abdominal aorta:  Not visualized due to overlying bowel gas.  IMPRESSION: 1.  Cannot exclude hepatic edema such as with acute hepatitis.  No focal liver lesion.  No biliary ductal dilatation. 2.  Gallbladder within normal limits.   Original Report Authenticated By: Harley Hallmark, M.D.    Ir Ivc Filter Plmt / S&i /img Guid/mod Sed  12/08/2011  *RADIOLOGY REPORT*  Clinical Data:  DVT, fall risk, the patient is not a candidate for anticoagulation.  ULTRASOUND GUIDANCE FOR VASCULAR ACCESS IVC CATHETERIZATION AND VENOGRAM IVC FILTER INSERTION  Date:  12/08/2011 09:35:00  Radiologist:  M. Ruel Favors, M.D.  Medications:  1 mg Versed, 50 mcg Fentanyl  Guidance:  Ultrasound and fluoroscopic  Fluoroscopy time:  2.2 minutes  Sedation time:   20 minutes  Contrast Volume:  40 ml Omnipaque-300  Complications:  No immediate  PROCEDURE/FINDINGS:  Informed consent was obtained from the patient following explanation of the procedure, risks, benefits and alternatives. The patient understands, agrees and consents for the procedure. All questions were addressed.  A time out was performed.  Maximal barrier sterile technique utilized including caps, mask, sterile gowns, sterile gloves, large sterile drape, hand hygiene, and betadine prep.  Under sterile condition and local anesthesia, right internal jugular venous access was performed with ultrasound.  Over a guide wire, the IVC filter delivery sheath and inner dilator were advanced into the IVC just above the IVC bifurcation.  Contrast injection was performed for an IVC venogram.  IVC VENOGRAM:  The IVC is patent.  No evidence of thrombus, stenosis, or occlusion.  No variant venous anatomy.  The renal veins are identified at L1.  IVC FILTER INSERTION:  Through the delivery sheath, the Cook Celect IVC filter was deployed in the infrarenal IVC at the L2-L3 level just below  the renal veins and above the IVC bifurcation.  Contrast injection confirmed position.  There is good apposition of the filter against the IVC.  The delivery sheath was removed and hemostasis was obtained with compression for 5 minutes.  The patient tolerated the procedure well.  No immediate complications.  IMPRESSION: Ultrasound and fluoroscopically guided infrarenal IVC filter insertion.   Original Report Authenticated By: Judie Petit. Ruel Favors, M.D.    US Renal  12/04/2011  *RADIOLOGY REPORT*  Clinical Data: Renal failure  RENAL/URINARY TRACT ULTRASOUND COMPLETE  Comparison:  CT abdomen pelvis dated 12/02/2011 and 04/25/2011  Findings:  Right Kidney:  Measures 11.1 cm.  Mild fullness of the renal collecting system with an extrarenal pelvis, unchanged from prior CTs.  Left Kidney:  Measures 11.2 cm.  Mild fullness of the renal collecting system  with an extrarenal pelvis, unchanged from prior CTs.  Bladder:  Distended.  IMPRESSION: Mild fullness of the bilateral renal collecting systems with extrarenal pelves, unchanged from prior CTs.  Distended bladder.   Original Report Authenticated By: Charline Bills, M.D.    Dg Chest Port 1 View  12/02/2011  *RADIOLOGY REPORT*  Clinical Data: Fever, weakness, shortness of breath  PORTABLE CHEST - 1 VIEW  Comparison: 11/13/2010  Findings: Lungs are essentially clear.  No focal dilatation. No pleural effusion or pneumothorax.  The heart is normal in size.  IMPRESSION: No evidence of acute cardiopulmonary disease.   Original Report Authenticated By: Charline Bills, M.D.     Current Medications:     . aspirin EC  81 mg Oral Daily  . ceFEPime (MAXIPIME) IV  1 g Intravenous Q12H  . cholecalciferol  1,000 Units Oral Daily  . DULoxetine  30 mg Oral QHS  . finasteride  5 mg Oral Daily  . gabapentin  300 mg Oral BID  . latanoprost  1 drop Both Eyes QHS  . pantoprazole  40 mg Oral Q1200  . polyethylene glycol  17 g Oral QHS  . sodium chloride  3 mL Intravenous Q12H  . Tamsulosin HCl  0.4 mg Oral Daily  . valACYclovir  1,000 mg Oral Daily  . vancomycin  750 mg Intravenous Q24H  . DISCONTD: amiodarone  400 mg Oral BID    ASSESSMENT:  1. Urosepsis 2. PAF 3. Transaminitis 4. Acute on CKD, stage 3 5. Recurrent DVT 6. CAD 7. Chronic diastolic CHF 8. PVD 9. HL  DISCUSSION/PLAN:  The patient feels much better today. Leukocytosis and LFTs are improving. Cr up-trending since admission despite IVF. Abdominal u/s revealed hepatic edema. Acute hepatitis could not be excluded. No evidence of focal liver lesion, biliary or cystic lesion. Amiodarone held yesterday. Continue holding potentially hepatotoxic medications. Maintaining NSR on telemetry. Will discuss with MD switching to alternative AAD or adding rate-control agent going forward. Not on anticoagulation due to hematuria. SCDs placed. No  abdominal/suprapubic tenderness on exam today. He is otherwise euvolemic and without endorsement or evidence of an acute cardiac issue. Further management per primary team.   Signed, R. Hurman Horn, PA-C 12/19/2011, 9:19 AM  Patient seen, examined. Available data reviewed. Agree with findings, assessment, and plan as outlined by Hurman Horn, PA-C. The patient was independently interviewed and examined. I have just seen him in the office last week and he is well-known to me. I agree with the assessment of Dr Jens Som from his consult yesterday and note above from Mr Shaune Spittle. His amiodarone will be discontinued in the setting of transaminitis. Other cardiac issues stable. I would not recommend any  other changes in his cardiac meds at this time.  Tonny Bollman, M.D. 12/19/2011 10:51 AM

## 2011-12-20 ENCOUNTER — Encounter: Payer: Self-pay | Admitting: Cardiovascular Disease

## 2011-12-20 DIAGNOSIS — B029 Zoster without complications: Secondary | ICD-10-CM

## 2011-12-20 LAB — CBC
HCT: 35.5 % — ABNORMAL LOW (ref 39.0–52.0)
Hemoglobin: 11.9 g/dL — ABNORMAL LOW (ref 13.0–17.0)
MCH: 32.5 pg (ref 26.0–34.0)
MCHC: 33.5 g/dL (ref 30.0–36.0)
MCV: 97 fL (ref 78.0–100.0)
RBC: 3.66 MIL/uL — ABNORMAL LOW (ref 4.22–5.81)

## 2011-12-20 LAB — URINE CULTURE

## 2011-12-20 MED ORDER — CIPROFLOXACIN HCL 500 MG PO TABS: 500.0000 mg | ORAL_TABLET | Freq: Two times a day (BID) | ORAL | Status: DC

## 2011-12-20 NOTE — Plan of Care (Signed)
Problem: Phase I Progression Outcomes Goal: EF % per last Echo/documented,Core Reminder form on chart Outcome: Completed/Met Date Met:  12/20/11 Echo done September 2013 showed EF 60-65%

## 2011-12-20 NOTE — ED Provider Notes (Signed)
Medical screening examination/treatment/procedure(s) were performed by non-physician practitioner and as supervising physician I was immediately available for consultation/collaboration.  Geoffery Lyons, MD 12/20/11 [phone]

## 2011-12-20 NOTE — Discharge Summary (Addendum)
Physician Discharge Summary  [name] NFA:[id number] DOB: 1920/03/12 DOA: 12/17/2011  PCP: Leo Grosser, MD  Admit date: 12/17/2011 Discharge date: 12/20/2011  Recommendations for Outpatient Follow-up:  1. Please follow up with heart rate and adjust medications as indicated.  Patient's amiodarone recently discontinued secondary to elevated liver enzymes.  On follow up consider other rate controlling medication pending heart rate. 2. Please make sure patient has follow up scheduled with cardiologist and urologist 3. Pt had UTI secondary to urinary retention and foley.  He will need voiding trial and further recommendations from urologist. 4. Addendum: also please be sure to check creatinine level  Discharge Diagnoses:  Principal Problem:  *Sepsis Active Problems:  ANXIETY  HYPERTENSION, BENIGN  GERD  Herpes zoster  PAF (paroxysmal atrial fibrillation)  UTI (lower urinary tract infection)  Transaminitis   Discharge Condition: Stable  Diet recommendation: Cardiac  Filed Weights   12/18/11 0257 12/19/11 0342 12/20/11 0626  Weight: 80.377 kg (177 lb 3.2 oz) 76.7 kg (169 lb 1.5 oz) 80.9 kg (178 lb 5.6 oz)    History of present illness:  From original HPI:  Jason [name] is a 78 y.o. male who presents with c/o fever onset suddenly this afternoon, after visiting with his son. The patient has had a foley in place since a hospital admission last week for herpes zoster affecting his leg. The patient had the foley placed for urinary retention secondary to BPH, had an appointment with his urologist in another day or so to have it removed / changed. His symptoms are also associated with change in quality of his urine in the foley, notably it has become cloudy also has suprapubic tenderness.  In the ED the patient was found to have a leukocytosis, and a UTI was confirmed. Rocephin and Vanc (vanc given that there was some concern last week at his PCPs office for thrombophlebitis in his  arm) were ordered and hospitalist service was consulted to admit to inpatient  Hospital Course:  1. Transaminitis - 2ary to Amiodarone and improved after discontinuation this admission.  Will defer any rate controlling medication to cardiology at this juncture. Given long half life of amiodarone at this juncture will plan on discharging patient off of rate controlling agent. Pt's heart rate has ranged from 58-66 and he has been in sinus rhythm with no red flags on telemetry monitoring.  Have discussed with [facility] cardiology and they are ok with having the patient follow up with them for further recommendations and evaluation as outpatient.   2. UTI:  - Urine cultures growing Pseudomonas sensitive to cipro.  As such will plan on discharging on cipro to complete a 7 day total course of antibiotics.  - cbc trending down. - Likely secondary to urinary retention and prolonged foley use.  Will recommend patient follow up with urologist for urodynamic testing and voiding trial as outpatient.  Have discussed with daughter and she is agreeable to this.   - Patient has follow up appointment with pcp tomorrow and I have recommended that patient keep that appointment  3. Sepsis  - Resolved on IV antibiotics  - Most likely from urinary source  - Patient is afebrile and WBC trending down   4. PAF  - Patient to follow up with cardiologist as outpatient.  5. Anxiety:  -Stable will continue duloxetine   6. Herpes Zoster  - Will discontinue antiviral as patient has had several days of treatment and at this juncture is asymptomatic.  Should patient continue with  this problem would have him f/u with his primary care physician for further recommendations.  7. GERD  - Continue home regimen   Procedures:  None this admission  Consultations:  Cardiologist: [place]: Tonny Bollman  Discharge Exam: Filed Vitals:   12/19/11 1504 12/20/11 0626 12/20/11 1107 12/20/11 1344  BP: 147/66 163/66 145/72  128/60  Pulse: 62 58 64 66  Temp: 97.6 F (36.4 C) 98.8 F (37.1 C)  98 F (36.7 C)  TempSrc: Oral Oral  Oral  Resp: 18 19 18 18   Height:      Weight:  80.9 kg (178 lb 5.6 oz)    SpO2: 94% 94% 96% 94%    General: Pt in NAD, A and O x 3 Cardiovascular: RRR, no MRG Respiratory: CTA BL, no wheezes  Discharge Instructions  Discharge Orders    Future Appointments: Provider: Department: Dept Phone: Center:   03/19/2012 9:15 AM [facility] [phone] [facility]     Future Orders Please Complete By Expires   Diet - low sodium heart healthy      Increase activity slowly      Discharge instructions      Comments:   Please be sure to follow up with your cardiologist in 1-2 weeks.    Also you will need to take your antibiotics for 4 more days to complete a total of 7 days of treatment.   Call MD for:  temperature >100.4      Call MD for:  difficulty breathing, headache or visual disturbances      Call MD for:  persistant dizziness or light-headedness          Medication List     As of 12/20/2011  2:19 PM    STOP taking these medications         AMBIEN 10 MG tablet   Generic drug: zolpidem      amiodarone 200 MG tablet   Commonly known as: PACERONE      valACYclovir 1000 MG tablet   Commonly known as: VALTREX      TAKE these medications         ALPRAZolam 0.25 MG tablet   Commonly known as: XANAX   Take 0.25 mg by mouth at bedtime as needed. anxiety      aspirin 81 MG tablet   Take 81 mg by mouth daily.      ciprofloxacin 500 MG tablet   Commonly known as: CIPRO   Take 1 tablet (500 mg total) by mouth 2 (two) times daily.      DULoxetine 30 MG capsule   Commonly known as: CYMBALTA   Take 30 mg by mouth at bedtime.      finasteride 5 MG tablet   Commonly known as: PROSCAR   Take 1 tablet (5 mg total) by mouth daily.      FLOMAX 0.4 MG Caps   Generic drug: Tamsulosin HCl   Take 0.4 mg by mouth daily.      gabapentin 300 MG  capsule   Commonly known as: NEURONTIN   Take 300 mg by mouth 2 (two) times daily.      HYDROcodone-acetaminophen 5-325 MG per tablet   Commonly known as: NORCO/VICODIN   Take 1 tablet by mouth every 6 (six) hours as needed. Pain        latanoprost 0.005 % ophthalmic solution   Commonly known as: XALATAN   Place 1 drop into both eyes at bedtime.      meclizine 25  MG tablet   Commonly known as: ANTIVERT   Take 12.5 mg by mouth as needed. dizziness      MIRALAX powder   Generic drug: polyethylene glycol powder   Take 17 g by mouth at bedtime. Constipation        nitroGLYCERIN 0.4 MG SL tablet   Commonly known as: NITROSTAT   Place 0.4 mg under the tongue every 5 (five) minutes as needed. Chest pain      omeprazole 20 MG capsule   Commonly known as: PRILOSEC   Take 20 mg by mouth 2 (two) times daily before a meal.      promethazine 25 MG tablet   Commonly known as: PHENERGAN   Take 25 mg by mouth every 6 (six) hours as needed. nausea      STOOL SOFTENER PO   Take 1 capsule by mouth at bedtime.      Vitamin D3 1000 UNITS Caps   Take 1 capsule by mouth daily.          The results of significant diagnostics from this hospitalization (including imaging, microbiology, ancillary and laboratory) are listed below for reference.    Significant Diagnostic Studies: Ct Abdomen Pelvis Wo Contrast  12/03/2011  *RADIOLOGY REPORT*  Clinical Data: Abdominal pain  CT ABDOMEN AND PELVIS WITHOUT CONTRAST  Technique:  Multidetector CT imaging of the abdomen and pelvis was performed following the standard protocol without intravenous contrast.  Comparison: 04/25/2011  Findings: Probable dependent atelectasis in the bilateral lung bases.  Small hiatal hernia.  Unenhanced liver, spleen, pancreas, and adrenal glands are within normal limits.  Gallbladder is unremarkable.  No intrahepatic or extrahepatic ductal dilatation.  At least two punctate nonobstructing right renal calculi. Bilateral  renal sinus cysts.  Extrarenal pelves.  No hydronephrosis.  No evidence of bowel obstruction.  Normal appendix.  Atherosclerotic calcifications of the abdominal aorta and branch vessels.  No abdominopelvic ascites.  No suspicious abdominopelvic lymphadenopathy.  Prostate is unremarkable.  No ureteral or bladder calculi.  Small fat-containing left inguinal hernia.  Degenerative changes of the visualized thoracolumbar spine.  IMPRESSION: No evidence of bowel obstruction.  Normal appendix.  Two punctate nonobstructing right renal calculi.  No ureteral or bladder calculi.  No hydronephrosis.  No CT findings to account for the patient's abdominal pain.   Original Report Authenticated By: Charline Bills, M.D.    Dg Chest 2 View  12/17/2011  *RADIOLOGY REPORT*  Clinical Data: 78 year old male shortness of breath.  Atrial fibrillation.  CHEST - 2 VIEW  Comparison: 12/02/2011 and earlier.  Findings: Seated AP and lateral views of the chest.  Lower lung volumes and kyphotic view exaggerate the cardiac silhouette on the AP view. Other mediastinal contours are within normal limits. Visualized tracheal air column is within normal limits.  No pneumothorax, pulmonary edema, pleural effusion or acute pulmonary opacity.  Osteopenia.  IMPRESSION: No acute cardiopulmonary abnormality.   Original Report Authenticated By: Harley Hallmark, M.D.    US Abdomen Complete  12/18/2011  *RADIOLOGY REPORT*  Clinical Data:  78 year old male right upper quadrant pain. Abnormal LFTs.  COMPLETE ABDOMINAL ULTRASOUND  Comparison:  CT abdomen and pelvis 12/02/2011.  Findings:  Gallbladder:  No shadowing echogenic stones.  No definite sludge. Wall thickness within normal limits at 2 mm.  No sonographic Murphy's sign elicited.  No pericholecystic fluid.  Common bile duct:  Within normal limits at 5 mm diameter.  Liver:  Echogenicity appears decreased, with a "starry sky" appearance (image 16).  No focal liver  lesion. No intrahepatic biliary ductal  dilatation.  No perihepatic fluid.  IVC:  Incompletely visualized due to overlying bowel gas, visualized portions within normal limits.  Pancreas:  Not visualized due to overlying bowel gas.  Spleen:  Within normal limits on.  6.4 cm in length.  Right Kidney:  Stable extrarenal pelvis.  No dilated intrarenal collecting system.  Length 10.6 cm.  Left Kidney:  No hydronephrosis.  Renal length 11.0 cm.  Abdominal aorta:  Not visualized due to overlying bowel gas.  IMPRESSION: 1.  Cannot exclude hepatic edema such as with acute hepatitis.  No focal liver lesion.  No biliary ductal dilatation. 2.  Gallbladder within normal limits.   Original Report Authenticated By: Harley Hallmark, M.D.    Ir Ivc Filter Plmt / S&i /img Guid/mod Sed  12/08/2011  *RADIOLOGY REPORT*  Clinical Data:  DVT, fall risk, the patient is not a candidate for anticoagulation.  ULTRASOUND GUIDANCE FOR VASCULAR ACCESS IVC CATHETERIZATION AND VENOGRAM IVC FILTER INSERTION  Date:  12/08/2011 09:35:00  Radiologist:  M. Ruel Favors, M.D.  Medications:  1 mg Versed, 50 mcg Fentanyl  Guidance:  Ultrasound and fluoroscopic  Fluoroscopy time:  2.2 minutes  Sedation time:  20 minutes  Contrast Volume:  40 ml Omnipaque-300  Complications:  No immediate  PROCEDURE/FINDINGS:  Informed consent was obtained from the patient following explanation of the procedure, risks, benefits and alternatives. The patient understands, agrees and consents for the procedure. All questions were addressed.  A time out was performed.  Maximal barrier sterile technique utilized including caps, mask, sterile gowns, sterile gloves, large sterile drape, hand hygiene, and betadine prep.  Under sterile condition and local anesthesia, right internal jugular venous access was performed with ultrasound.  Over a guide wire, the IVC filter delivery sheath and inner dilator were advanced into the IVC just above the IVC bifurcation.  Contrast injection was performed for an IVC venogram.  IVC  VENOGRAM:  The IVC is patent.  No evidence of thrombus, stenosis, or occlusion.  No variant venous anatomy.  The renal veins are identified at L1.  IVC FILTER INSERTION:  Through the delivery sheath, the Cook Celect IVC filter was deployed in the infrarenal IVC at the L2-L3 level just below the renal veins and above the IVC bifurcation.  Contrast injection confirmed position.  There is good apposition of the filter against the IVC.  The delivery sheath was removed and hemostasis was obtained with compression for 5 minutes.  The patient tolerated the procedure well.  No immediate complications.  IMPRESSION: Ultrasound and fluoroscopically guided infrarenal IVC filter insertion.   Original Report Authenticated By: Judie Petit. Ruel Favors, M.D.    US Renal  12/04/2011  *RADIOLOGY REPORT*  Clinical Data: Renal failure  RENAL/URINARY TRACT ULTRASOUND COMPLETE  Comparison:  CT abdomen pelvis dated 12/02/2011 and 04/25/2011  Findings:  Right Kidney:  Measures 11.1 cm.  Mild fullness of the renal collecting system with an extrarenal pelvis, unchanged from prior CTs.  Left Kidney:  Measures 11.2 cm.  Mild fullness of the renal collecting system with an extrarenal pelvis, unchanged from prior CTs.  Bladder:  Distended.  IMPRESSION: Mild fullness of the bilateral renal collecting systems with extrarenal pelves, unchanged from prior CTs.  Distended bladder.   Original Report Authenticated By: Charline Bills, M.D.    Dg Chest Port 1 View  12/02/2011  *RADIOLOGY REPORT*  Clinical Data: Fever, weakness, shortness of breath  PORTABLE CHEST - 1 VIEW  Comparison: 11/13/2010  Findings: Lungs are  essentially clear.  No focal dilatation. No pleural effusion or pneumothorax.  The heart is normal in size.  IMPRESSION: No evidence of acute cardiopulmonary disease.   Original Report Authenticated By: Charline Bills, M.D.     Microbiology: Recent Results (from the past 240 hour(s))  URINE CULTURE     Status: Normal   Collection Time     12/18/11  2:01 AM      Component Value Range Status Comment   Specimen Description URINE, CATHETERIZED   Final    Special Requests NONE   Final    Culture  Setup Time 12/18/2011 12:33   Final    Colony Count >=100,000 COLONIES/ML   Final    Culture PSEUDOMONAS AERUGINOSA   Final    Report Status 12/20/2011 FINAL   Final    Organism ID, Bacteria PSEUDOMONAS AERUGINOSA   Final      Labs: Basic Metabolic Panel:  Lab 12/19/11 8657 12/18/11 0520 12/17/11 2131  NA 135 135 135  K 4.1 4.4 3.7  CL 103 101 100  CO2 23 23 22   GLUCOSE 84 72 102*  BUN 24* 25* 24*  CREATININE 1.52* 1.45* 1.28  CALCIUM 8.3* 8.7 9.1  MG -- -- --  PHOS -- -- --   Liver Function Tests:  Lab 12/19/11 0555 12/17/11 2131  AST 56* 309*  ALT 132* 249*  ALKPHOS 102 116  BILITOT 0.6 0.5  PROT 5.8* 7.3  ALBUMIN 2.1* 3.0*   No results found for this basename: LIPASE:5,AMYLASE:5 in the last 168 hours No results found for this basename: AMMONIA:5 in the last 168 hours CBC:  Lab 12/20/11 1330 12/19/11 0555 12/18/11 0520 12/17/11 2131  WBC 9.3 17.6* 31.4* 18.7*  NEUTROABS -- -- -- 17.0*  HGB 11.9* 11.2* 12.6* 13.7  HCT 35.5* 33.6* 37.3* 39.3  MCV 97.0 97.4 96.6 95.2  PLT 204 230 269 296   Cardiac Enzymes: No results found for this basename: CKTOTAL:5,CKMB:5,CKMBINDEX:5,TROPONINI:5 in the last 168 hours BNP: BNP (last 3 results) No results found for this basename: PROBNP:3 in the last 8760 hours CBG:  Lab 12/19/11 1624 12/19/11 1159  GLUCAP 104* 113*    Time coordinating discharge:> 45 minutes  Signed:  Penny Pia  [facility] 12/20/2011, 2:19 PM  Addendum Admitted with Sepsis and UTI  Discharged home following previous admission with foley catheter in place.  Foley left in place due to urinary retention. Urine CX - PSEUDOMONAS AERUGINOSA   Urosepsis thought to be a complication of foley catheter.  Initially foley catheter replaced.  Patient was discharged on antibiotic with  appropriate coverage and follow up with pcp the next day.

## 2011-12-20 NOTE — Progress Notes (Signed)
1630 discharge instructions and prescription given to pt and daughter. Both verbalized understanding . Discharged to home  With foley cath in place with  Md's order order. Transported to lobby by NT

## 2011-12-27 NOTE — Addendum Note (Signed)
Addended by: Marrion Coy L on: 12/27/2011 09:36 AM   Modules accepted: Orders

## 2011-12-27 NOTE — Addendum Note (Signed)
Addended by: Marrion Coy L on: 12/27/2011 09:27 AM   Modules accepted: Orders

## 2011-12-27 NOTE — Addendum Note (Signed)
Addended by: Iona Coach on: 12/27/2011 09:41 AM   Modules accepted: Orders

## 2012-01-05 ENCOUNTER — Encounter: Payer: Self-pay | Admitting: Physician Assistant

## 2012-01-05 ENCOUNTER — Ambulatory Visit (INDEPENDENT_AMBULATORY_CARE_PROVIDER_SITE_OTHER): Payer: Medicare Other | Admitting: Physician Assistant

## 2012-01-05 VITALS — BP 104/54 | HR 51 | Ht 72.0 in | Wt 176.0 lb

## 2012-01-05 DIAGNOSIS — R7989 Other specified abnormal findings of blood chemistry: Secondary | ICD-10-CM

## 2012-01-05 DIAGNOSIS — R42 Dizziness and giddiness: Secondary | ICD-10-CM

## 2012-01-05 DIAGNOSIS — I2510 Atherosclerotic heart disease of native coronary artery without angina pectoris: Secondary | ICD-10-CM

## 2012-01-05 DIAGNOSIS — I4891 Unspecified atrial fibrillation: Secondary | ICD-10-CM

## 2012-01-05 NOTE — Patient Instructions (Addendum)
Your physician has recommended you make the following change in your medication:  DISCONTINUE TOPROL   Please look into getting compression stockings as discussed and wear them daily  Your physician recommends that you schedule a follow-up appointment in: ONE MONTH WITH DR. Excell Seltzer  STOP ASPIRIN ( 81 MG) DAILY WHILE ON [name]

## 2012-01-05 NOTE — Progress Notes (Signed)
[address], [address] [place], [place]  [address] Phone: [phone], Fax:  [phone]  Date:  01/05/2012   Name:  Jason [name]   DOB:  1919-08-06   MRN:  [id number]  PCP:  Leo Grosser, MD  Primary Cardiologist:  Dr. Tonny Bollman  Primary Electrophysiologist:  None    History of Present Illness: Jason [name] is a 78 y.o. male who returns for post hospital followup.  He has a history of CAD, s/p remote PCI with BMS to the CFX, recurrent DVTs, anticoagulation discontinued secondary to imbalance and hematuria, status post IVC filter, prior stroke, HL who was admitted 10/6-10/9 with urosepsis. He had been maintained on amiodarone for atrial fibrillation which was noted during a recent hospitalization for fever, confusion and possible soft tissue infection. During his most recent hospitalization, he was noted to have transaminitis. Therefore, his amiodarone was discontinued. He was treated with antibiotics for his urinary tract infection. Cultures grew out Pseudomonas.  Since d/c, his PCP has started him on Toprol XL 12.5 mg QD and Xarelto 20 mg QD.  He feels weak.  Denies chest pain.  Notes dyspnea with mild to mod activity.  No orthopnea, PND, edema.  Gets lightheaded with standing.  No syncope.    Labs (9/13):     TSH 5.754 Labs (10/13):   K 4.1, creatinine 1.52->1.37, AST 309-> 56->20, ALT 249-> 132->60, Hgb 11.9, hepatitis serologies negative   Wt Readings from Last 3 Encounters:  01/05/12 176 lb (79.833 kg)  12/20/11 178 lb 5.6 oz (80.9 kg)  12/15/11 175 lb 1.9 oz (79.434 kg)     Past Medical History  Diagnosis Date  . PVD (peripheral vascular disease)   . CAD (coronary artery disease)     a. 12/2005 - PCI to OM  . Hyperlipidemia   . Cerebrovascular accident   . DVT femoral (deep venous thrombosis) with thrombophlebitis   . GERD (gastroesophageal reflux disease)   . Edema   . Anxiety   . Vertigo   . IBS (irritable bowel syndrome)   . Duodenitis     . Gastritis   . Esophageal stricture   . Cellulitis   . Pulmonary embolism   . Prostatic hypertrophy   . Frequent falls   . Subdural hematoma   . PTSD (post-traumatic stress disorder)   . Vertigo   . TIA (transient ischemic attack)     a. 01/2006 and 05/2006.   [name] Hiatal hernia     periferal neuropathy  . Hx of echocardiogram     Echo 9/13:  Mild LVH, EF 60-65%, Gr 1 diast dysfn, mild AI, mild BAE    Current Outpatient Prescriptions  Medication Sig Dispense Refill  . ALPRAZolam (XANAX) 0.25 MG tablet Take 0.25 mg by mouth at bedtime as needed. anxiety      . aspirin 81 MG tablet Take 81 mg by mouth daily.        . Cholecalciferol (VITAMIN D3) 1000 UNITS CAPS Take 1 capsule by mouth daily.        . ciprofloxacin (CIPRO) 500 MG tablet Take 1 tablet (500 mg total) by mouth 2 (two) times daily.  8 tablet  0  . Docusate Calcium (STOOL SOFTENER PO) Take 1 capsule by mouth at bedtime.      . DULoxetine (CYMBALTA) 30 MG capsule Take 30 mg by mouth at bedtime.      . finasteride (PROSCAR) 5 MG tablet Take 1 tablet (5 mg total) by mouth daily.  30 tablet  0  . gabapentin (NEURONTIN) 300 MG capsule Take 600 mg by mouth 2 (two) times daily.       [name] HYDROcodone-acetaminophen (NORCO) 5-325 MG per tablet Take 1 tablet by mouth every 6 (six) hours as needed. Pain       . latanoprost (XALATAN) 0.005 % ophthalmic solution Place 1 drop into both eyes at bedtime.      . meclizine (ANTIVERT) 25 MG tablet Take 12.5 mg by mouth as needed. dizziness      . metoprolol succinate (TOPROL-XL) 25 MG 24 hr tablet Take 12.5 mg by mouth daily.      . nitroGLYCERIN (NITROSTAT) 0.4 MG SL tablet Place 0.4 mg under the tongue every 5 (five) minutes as needed. Chest pain      . omeprazole (PRILOSEC) 20 MG capsule Take 20 mg by mouth 2 (two) times daily before a meal.       . polyethylene glycol powder (MIRALAX) powder Take 17 g by mouth at bedtime. Constipation       . promethazine (PHENERGAN) 25 MG tablet Take 25 mg  by mouth every 6 (six) hours as needed. nausea      . Rivaroxaban (XARELTO) 20 MG TABS Take 20 mg by mouth daily with supper.      . Tamsulosin HCl (FLOMAX) 0.4 MG CAPS Take 0.4 mg by mouth 2 (two) times daily.       [name] zolpidem (AMBIEN) 5 MG tablet Take 5 mg by mouth at bedtime as needed.        Allergies: Allergies  Allergen Reactions  . Amoxicillin-Pot Clavulanate Nausea And Vomiting    Unknown-daughter states he can tolerate Amoxicillin ok  . Fludrocortisone Acetate Other (See Comments)    unknown  . Scopace (Scopolamine) Other (See Comments)    *patch Unknown reaction    Social History:   reports that he has never smoked. He has never used smokeless tobacco. He reports that he drinks alcohol. He reports that he does not use illicit drugs.   ROS:  Please see the history of present illness.   No bleeding noted.   Bumped side of face on car door when he got here today and has a small cut lateral to eye.  All other systems reviewed and negative.   PHYSICAL EXAM: VS:  BP 104/54  Pulse 51  Ht 6' (1.829 m)  Wt 176 lb (79.833 kg)  BMI 23.87 kg/m2 Elderly male in no acute distress arriving in a wheelchair. HEENT: normal Neck: no JVD at 90 Cardiac:  normal S1, S2; RRR; no murmur Lungs:  clear to auscultation bilaterally, no wheezing, rhonchi or rales Abd: soft, nontender, no hepatomegaly Ext: no edema Skin: warm and dry Neuro:  CNs 2-12 intact, no focal abnormalities noted  EKG:  Sinus bradycardia, HR 51, normal axis, incomplete right bundle branch block no ischemic changes      ASSESSMENT AND PLAN:  1. Atrial Fibrillation:   Maintaining NSR without Amiodarone.  HR is in the 50s and BP is marginal.  He has orthostatic intolerance as well.  I want him to stop his Toprol.  He is now on Xarelto which was started by his PCP.  His GFR ~ 45-50.  Probably should be on 15 mg QD.  I note that Dr. Excell Seltzer has recommended no anticoagulation in the past due to high fall risk.  Since he is on  Xarelto, he can stop his ASA.  I will review with Dr. Excell Seltzer regarding his anticoagulation.  Follow up with Dr. Excell Seltzer in 1 month.  2. Coronary Artery Disease:   No angina.  Stop ASA as he is on Xarelto.  3. Prior DVT:   He is s/p IVC filter.  4. Urosepsis:   Resolved.  5. Orthostatic Intolerance:   He is now on Flomax bid per urology.  This is likely making orthostasis worse.  He is on Toprol as well with a low HR.  I am stopping his Toprol.  He will wear compression stockings daily.    6. Transaminitis:  Question related to sepsis.  Resolved off amiodarone.  Will not resume Amiodarone.    Signed, Tereso Newcomer, PA-C  11:40 AM 01/05/2012

## 2012-01-08 ENCOUNTER — Telehealth: Payer: Self-pay | Admitting: *Deleted

## 2012-01-08 MED ORDER — RIVAROXABAN 15 MG PO TABS: 15.0000 mg | ORAL_TABLET | Freq: Every day | ORAL | Status: DC

## 2012-01-08 NOTE — Telephone Encounter (Signed)
s/w pt's daughter about xarelto dose decreased from 20 mg to 15 mg daily due to renal function per Bing Neighbors, [place] and Dr. Excell Seltzer. I sent in new rx for 15 mg and I will put samples @ the front deks as well today, daughter verbalized understanding today

## 2012-01-08 NOTE — Telephone Encounter (Signed)
daughter aware of scmaples at front desk

## 2012-01-08 NOTE — Telephone Encounter (Signed)
Message copied by Tarri Fuller on Mon Jan 08, 2012 10:04 AM ------      Message from: Maybee, [place] T      Created: Mon Jan 08, 2012  8:42 AM       Okey Regal      Please contact patient and notify him that we want him to reduce his dose of Xarelto.      He needs to be on Xarelto 15 mg daily.      This is based on his renal function.      Not sure if we have samples to give him or not.      Thanks      Tereso Newcomer, [place]  8:43 AM 01/08/2012                   ----- Message -----         From: Tonny Bollman, MD         Sent: 01/07/2012   1:47 PM           To: Sharyn Blitz, RN, Beatrice Lecher, PA            Scott - I think Xarelto is fine and agree with dose reduction and stopping ASA as you suggest. thx      ----- Message -----         From: Beatrice Lecher, PA         Sent: 01/05/2012  12:19 PM           To: Tonny Bollman, MD

## 2012-02-16 ENCOUNTER — Telehealth: Payer: Self-pay

## 2012-02-16 ENCOUNTER — Ambulatory Visit (INDEPENDENT_AMBULATORY_CARE_PROVIDER_SITE_OTHER): Payer: Medicare Other | Admitting: Cardiovascular Disease

## 2012-02-16 ENCOUNTER — Encounter: Payer: Self-pay | Admitting: Cardiovascular Disease

## 2012-02-16 VITALS — BP 143/84 | Ht 72.0 in | Wt 183.0 lb

## 2012-02-16 DIAGNOSIS — I4891 Unspecified atrial fibrillation: Secondary | ICD-10-CM

## 2012-02-16 NOTE — Progress Notes (Signed)
HPI:  78 year old gentleman presenting for followup of coronary artery disease, recurrent DVTs, and paroxysmal atrial fibrillation. He's had a difficult time over the last several months and has been hospitalized on a few different occasions. Most recently he's developed a dental abscess and he requires a tooth extraction later this month. There is a question about whether he is able to discontinue Xarelto for a few days.  He has not had any recent chest pain or pressure. He denies leg swelling or pain. He denies palpitations, orthopnea, or PND.  Outpatient Encounter Prescriptions as of 02/16/2012  Medication Sig Dispense Refill  . ALPRAZolam (XANAX) 0.25 MG tablet Take 0.25 mg by mouth at bedtime as needed. anxiety      . Cholecalciferol (VITAMIN D3) 1000 UNITS CAPS Take 1 capsule by mouth daily.        Tery Sanfilippo Calcium (STOOL SOFTENER PO) Take 1 capsule by mouth at bedtime.      . DULoxetine (CYMBALTA) 30 MG capsule Take 30 mg by mouth at bedtime.      . gabapentin (NEURONTIN) 300 MG capsule Take 600 mg by mouth 2 (two) times daily.       [name] HYDROcodone-acetaminophen (NORCO) 5-325 MG per tablet Take 1 tablet by mouth every 6 (six) hours as needed. Pain       . latanoprost (XALATAN) 0.005 % ophthalmic solution Place 1 drop into both eyes at bedtime.      . meclizine (ANTIVERT) 25 MG tablet Take 12.5 mg by mouth as needed. dizziness      . nitroGLYCERIN (NITROSTAT) 0.4 MG SL tablet Place 0.4 mg under the tongue every 5 (five) minutes as needed. Chest pain      . omeprazole (PRILOSEC) 20 MG capsule Take 20 mg by mouth 2 (two) times daily before a meal.       . polyethylene glycol powder (MIRALAX) powder Take 17 g by mouth at bedtime. Constipation       . promethazine (PHENERGAN) 25 MG tablet Take 25 mg by mouth every 6 (six) hours as needed. nausea      . Rivaroxaban (XARELTO) 15 MG TABS tablet Take 1 tablet (15 mg total) by mouth daily with supper.  30 tablet  6  . Tamsulosin HCl (FLOMAX)  0.4 MG CAPS Take 0.4 mg by mouth 2 (two) times daily.       [name] zolpidem (AMBIEN) 5 MG tablet Take 5 mg by mouth at bedtime as needed.      . [DISCONTINUED] ciprofloxacin (CIPRO) 500 MG tablet Take 1 tablet (500 mg total) by mouth 2 (two) times daily.  8 tablet  0  . [DISCONTINUED] finasteride (PROSCAR) 5 MG tablet Take 1 tablet (5 mg total) by mouth daily.  30 tablet  0    Allergies  Allergen Reactions  . Amoxicillin-Pot Clavulanate Nausea And Vomiting    Unknown-daughter states he can tolerate Amoxicillin ok  . Fludrocortisone Acetate Other (See Comments)    unknown  . Scopace (Scopolamine) Other (See Comments)    *patch Unknown reaction    Past Medical History  Diagnosis Date  . PVD (peripheral vascular disease)   . CAD (coronary artery disease)     a. 12/2005 - PCI to OM  . Hyperlipidemia   . Cerebrovascular accident   . DVT femoral (deep venous thrombosis) with thrombophlebitis   . GERD (gastroesophageal reflux disease)   . Edema   . Anxiety   . Vertigo   . IBS (irritable bowel syndrome)   .  Duodenitis   . Gastritis   . Esophageal stricture   . Cellulitis   . Pulmonary embolism   . Prostatic hypertrophy   . Frequent falls   . Subdural hematoma   . PTSD (post-traumatic stress disorder)   . Vertigo   . TIA (transient ischemic attack)     a. 01/2006 and 05/2006.   [name] Hiatal hernia     periferal neuropathy  . Hx of echocardiogram     Echo 9/13:  Mild LVH, EF 60-65%, Gr 1 diast dysfn, mild AI, mild BAE    ROS: Negative except as per HPI  BP 143/84  Ht 6' (1.829 m)  Wt 83.008 kg (183 lb)  BMI 24.82 kg/m2  PHYSICAL EXAM: Pt is alert and oriented, pleasant elderly male in NAD HEENT: normal Neck: JVP - normal, carotids 2+= with left greater than right carotid bruits Lungs: CTA bilaterally CV: RRR with widely split but physiologic S2 Abd: soft, NT, Positive BS, no hepatomegaly Ext: no C/C/E, distal pulses intact and equal Skin: warm/dry no rash  EKG:  Normal  sinus rhythm with right bundle branch block, heart rate 68 beats per minute.  ASSESSMENT AND PLAN: 1. Coronary artery disease, native vessel. The patient remained stable without anginal symptoms. He is not on antiplatelet drugs because of chronic anticoagulation. Continue observation. I will see him back in 4 months for followup.  2. Hypercoagulable state with recurrent DVTs. He is status post IVC filter placement. I think it is reasonable that he hold Xarelto 48 hours prior to dental extraction and resume the day following his extraction as long as Dr Warren Danes is comfortable with that approach.  3. Paroxysmal atrial fibrillation. The patient remains in sinus rhythm with no recurrence of atrial fibrillation over recent months.  For followup, I will see him back in 4 months.  Tonny Bollman 02/16/2012 3:58 PM

## 2012-02-16 NOTE — Telephone Encounter (Signed)
Office note from 02/16/12 which addresses this faxed to Dr. Bubba Camp office.

## 2012-02-16 NOTE — Telephone Encounter (Signed)
Dr Warren Danes called the office because the pt needs 2 teeth extracted.  Dr Warren Danes would like to know if the pt can hold Xarelto 2 days prior to extraction. I will forward this note to Dr Excell Seltzer for review.  Clearance can be faxed to [phone].

## 2012-02-16 NOTE — Patient Instructions (Addendum)
Your physician wants you to follow-up in: 4 months. You will receive a reminder letter in the mail two months in advance. If you don't receive a letter, please call our office to schedule the follow-up appointment.  

## 2012-03-02 ENCOUNTER — Inpatient Hospital Stay (HOSPITAL_COMMUNITY)
Admission: EM | Admit: 2012-03-02 | Discharge: 2012-03-07 | DRG: 603 | Disposition: A | Discharge status: Home Health | Payer: Medicare Other | Attending: Internal Medicine | Admitting: Internal Medicine

## 2012-03-02 ENCOUNTER — Encounter (HOSPITAL_COMMUNITY): Payer: Self-pay | Admitting: Emergency Medicine

## 2012-03-02 DIAGNOSIS — L03119 Cellulitis of unspecified part of limb: Principal | ICD-10-CM

## 2012-03-02 DIAGNOSIS — D696 Thrombocytopenia, unspecified: Secondary | ICD-10-CM

## 2012-03-02 DIAGNOSIS — I129 Hypertensive chronic kidney disease with stage 1 through stage 4 chronic kidney disease, or unspecified chronic kidney disease: Secondary | ICD-10-CM | POA: Diagnosis present

## 2012-03-02 DIAGNOSIS — F411 Generalized anxiety disorder: Secondary | ICD-10-CM

## 2012-03-02 DIAGNOSIS — K5900 Constipation, unspecified: Secondary | ICD-10-CM | POA: Diagnosis present

## 2012-03-02 DIAGNOSIS — N179 Acute kidney failure, unspecified: Secondary | ICD-10-CM

## 2012-03-02 DIAGNOSIS — K5909 Other constipation: Secondary | ICD-10-CM

## 2012-03-02 DIAGNOSIS — K589 Irritable bowel syndrome without diarrhea: Secondary | ICD-10-CM

## 2012-03-02 DIAGNOSIS — I2510 Atherosclerotic heart disease of native coronary artery without angina pectoris: Secondary | ICD-10-CM | POA: Diagnosis present

## 2012-03-02 DIAGNOSIS — E871 Hypo-osmolality and hyponatremia: Secondary | ICD-10-CM | POA: Diagnosis present

## 2012-03-02 DIAGNOSIS — E860 Dehydration: Secondary | ICD-10-CM

## 2012-03-02 DIAGNOSIS — R42 Dizziness and giddiness: Secondary | ICD-10-CM

## 2012-03-02 DIAGNOSIS — R5381 Other malaise: Secondary | ICD-10-CM | POA: Diagnosis present

## 2012-03-02 DIAGNOSIS — N390 Urinary tract infection, site not specified: Secondary | ICD-10-CM

## 2012-03-02 DIAGNOSIS — I480 Paroxysmal atrial fibrillation: Secondary | ICD-10-CM

## 2012-03-02 DIAGNOSIS — I82509 Chronic embolism and thrombosis of unspecified deep veins of unspecified lower extremity: Secondary | ICD-10-CM | POA: Diagnosis present

## 2012-03-02 DIAGNOSIS — K219 Gastro-esophageal reflux disease without esophagitis: Secondary | ICD-10-CM

## 2012-03-02 DIAGNOSIS — Z86718 Personal history of other venous thrombosis and embolism: Secondary | ICD-10-CM

## 2012-03-02 DIAGNOSIS — E785 Hyperlipidemia, unspecified: Secondary | ICD-10-CM

## 2012-03-02 DIAGNOSIS — R310 Gross hematuria: Secondary | ICD-10-CM | POA: Diagnosis present

## 2012-03-02 DIAGNOSIS — B029 Zoster without complications: Secondary | ICD-10-CM

## 2012-03-02 DIAGNOSIS — A419 Sepsis, unspecified organism: Secondary | ICD-10-CM

## 2012-03-02 DIAGNOSIS — Z7901 Long term (current) use of anticoagulants: Secondary | ICD-10-CM

## 2012-03-02 DIAGNOSIS — L02419 Cutaneous abscess of limb, unspecified: Principal | ICD-10-CM | POA: Diagnosis present

## 2012-03-02 DIAGNOSIS — L03116 Cellulitis of left lower limb: Secondary | ICD-10-CM | POA: Diagnosis present

## 2012-03-02 DIAGNOSIS — I10 Essential (primary) hypertension: Secondary | ICD-10-CM

## 2012-03-02 DIAGNOSIS — I739 Peripheral vascular disease, unspecified: Secondary | ICD-10-CM

## 2012-03-02 DIAGNOSIS — I4891 Unspecified atrial fibrillation: Secondary | ICD-10-CM

## 2012-03-02 DIAGNOSIS — N183 Chronic kidney disease, stage 3 unspecified: Secondary | ICD-10-CM

## 2012-03-02 LAB — BASIC METABOLIC PANEL
CO2: 24 mEq/L (ref 19–32)
Calcium: 9 mg/dL (ref 8.4–10.5)
Creatinine, Ser: 1.63 mg/dL — ABNORMAL HIGH (ref 0.50–1.35)
GFR calc non Af Amer: 35 mL/min — ABNORMAL LOW (ref >90)
Sodium: 133 mEq/L — ABNORMAL LOW (ref 135–145)

## 2012-03-02 LAB — CBC WITH DIFFERENTIAL/PLATELET
Basophils Absolute: 0 10*3/uL (ref 0.0–0.1)
Basophils Relative: 0 % (ref 0–1)
Eosinophils Absolute: 0 10*3/uL (ref 0.0–0.7)
Eosinophils Relative: 0 % (ref 0–5)
HCT: 41.2 % (ref 39.0–52.0)
Lymphocytes Relative: 13 % (ref 12–46)
MCH: 33.1 pg (ref 26.0–34.0)
MCHC: 34.2 g/dL (ref 30.0–36.0)
MCV: 96.7 fL (ref 78.0–100.0)
Monocytes Absolute: 1.7 10*3/uL — ABNORMAL HIGH (ref 0.1–1.0)
Platelets: 177 10*3/uL (ref 150–400)
RDW: 15.1 % (ref 11.5–15.5)

## 2012-03-02 MED ORDER — SODIUM CHLORIDE 0.9 % IV SOLN
INTRAVENOUS | Status: AC
  Administered 2012-03-02: 16:00:00 via INTRAVENOUS

## 2012-03-02 MED ORDER — GABAPENTIN 300 MG PO CAPS
600.0000 mg | ORAL_CAPSULE | Freq: Two times a day (BID) | ORAL | Status: DC
  Administered 2012-03-02 – 2012-03-07 (×10): 600 mg via ORAL
  Filled 2012-03-02 (×11): qty 2

## 2012-03-02 MED ORDER — POLYETHYLENE GLYCOL 3350 17 GM/SCOOP PO POWD
17.0000 g | Freq: Every day | ORAL | Status: DC
  Filled 2012-03-02: qty 255

## 2012-03-02 MED ORDER — MECLIZINE HCL 12.5 MG PO TABS
12.5000 mg | ORAL_TABLET | Freq: Three times a day (TID) | ORAL | Status: DC | PRN
  Filled 2012-03-02: qty 1

## 2012-03-02 MED ORDER — VANCOMYCIN HCL IN DEXTROSE 1-5 GM/200ML-% IV SOLN
1000.0000 mg | INTRAVENOUS | Status: DC
  Administered 2012-03-02 – 2012-03-04 (×3): 1000 mg via INTRAVENOUS
  Filled 2012-03-02 (×4): qty 200

## 2012-03-02 MED ORDER — ONDANSETRON HCL 4 MG/2ML IJ SOLN
4.0000 mg | Freq: Four times a day (QID) | INTRAMUSCULAR | Status: DC | PRN
  Administered 2012-03-05: 4 mg via INTRAVENOUS
  Filled 2012-03-02: qty 2

## 2012-03-02 MED ORDER — MORPHINE SULFATE 2 MG/ML IJ SOLN
2.0000 mg | Freq: Once | INTRAMUSCULAR | Status: AC
  Administered 2012-03-02: 2 mg via INTRAVENOUS
  Filled 2012-03-02: qty 1

## 2012-03-02 MED ORDER — ALBUTEROL SULFATE (5 MG/ML) 0.5% IN NEBU: 2.5000 mg | INHALATION_SOLUTION | RESPIRATORY_TRACT | Status: DC | PRN

## 2012-03-02 MED ORDER — ZOLPIDEM TARTRATE 5 MG PO TABS: 5.0000 mg | ORAL_TABLET | Freq: Every evening | ORAL | Status: DC | PRN

## 2012-03-02 MED ORDER — RIVAROXABAN 15 MG PO TABS
15.0000 mg | ORAL_TABLET | Freq: Every day | ORAL | Status: DC
  Administered 2012-03-02 – 2012-03-06 (×5): 15 mg via ORAL
  Filled 2012-03-02 (×6): qty 1

## 2012-03-02 MED ORDER — DULOXETINE HCL 30 MG PO CPEP
30.0000 mg | ORAL_CAPSULE | Freq: Every day | ORAL | Status: DC
  Administered 2012-03-02 – 2012-03-06 (×5): 30 mg via ORAL
  Filled 2012-03-02 (×7): qty 1

## 2012-03-02 MED ORDER — LEVOFLOXACIN IN D5W 750 MG/150ML IV SOLN
750.0000 mg | INTRAVENOUS | Status: DC
  Administered 2012-03-04: 750 mg via INTRAVENOUS
  Filled 2012-03-02: qty 150

## 2012-03-02 MED ORDER — ACETAMINOPHEN 650 MG RE SUPP: 650.0000 mg | Freq: Four times a day (QID) | RECTAL | Status: DC | PRN

## 2012-03-02 MED ORDER — LATANOPROST 0.005 % OP SOLN
1.0000 [drp] | Freq: Every day | OPHTHALMIC | Status: DC
  Administered 2012-03-02 – 2012-03-06 (×5): 1 [drp] via OPHTHALMIC
  Filled 2012-03-02: qty 2.5

## 2012-03-02 MED ORDER — SODIUM CHLORIDE 0.9 % IV SOLN
INTRAVENOUS | Status: DC
  Administered 2012-03-02: 14:00:00 via INTRAVENOUS

## 2012-03-02 MED ORDER — ALPRAZOLAM 0.25 MG PO TABS: 0.2500 mg | ORAL_TABLET | Freq: Every evening | ORAL | Status: DC | PRN

## 2012-03-02 MED ORDER — HYDROCODONE-ACETAMINOPHEN 5-325 MG PO TABS
1.0000 | ORAL_TABLET | Freq: Four times a day (QID) | ORAL | Status: DC | PRN
  Administered 2012-03-02 – 2012-03-05 (×7): 1 via ORAL
  Filled 2012-03-02 (×7): qty 1

## 2012-03-02 MED ORDER — DOCUSATE SODIUM 100 MG PO CAPS
100.0000 mg | ORAL_CAPSULE | Freq: Every day | ORAL | Status: DC
  Administered 2012-03-02 – 2012-03-06 (×5): 100 mg via ORAL
  Filled 2012-03-02 (×4): qty 1

## 2012-03-02 MED ORDER — PANTOPRAZOLE SODIUM 40 MG PO TBEC
40.0000 mg | DELAYED_RELEASE_TABLET | Freq: Two times a day (BID) | ORAL | Status: DC
  Administered 2012-03-02 – 2012-03-07 (×10): 40 mg via ORAL
  Filled 2012-03-02 (×11): qty 1

## 2012-03-02 MED ORDER — MORPHINE SULFATE 2 MG/ML IJ SOLN
1.0000 mg | INTRAMUSCULAR | Status: DC | PRN
  Administered 2012-03-03 – 2012-03-06 (×3): 1 mg via INTRAVENOUS
  Filled 2012-03-02 (×3): qty 1

## 2012-03-02 MED ORDER — POLYETHYLENE GLYCOL 3350 17 G PO PACK
17.0000 g | PACK | Freq: Every day | ORAL | Status: DC
  Administered 2012-03-02 – 2012-03-06 (×5): 17 g via ORAL
  Filled 2012-03-02 (×4): qty 1

## 2012-03-02 MED ORDER — ACETAMINOPHEN 325 MG PO TABS
650.0000 mg | ORAL_TABLET | Freq: Four times a day (QID) | ORAL | Status: DC | PRN
  Administered 2012-03-05: 650 mg via ORAL
  Filled 2012-03-02: qty 2

## 2012-03-02 MED ORDER — ONDANSETRON HCL 4 MG PO TABS: 4.0000 mg | ORAL_TABLET | Freq: Four times a day (QID) | ORAL | Status: DC | PRN

## 2012-03-02 MED ORDER — TAMSULOSIN HCL 0.4 MG PO CAPS
0.4000 mg | ORAL_CAPSULE | Freq: Every day | ORAL | Status: DC
  Administered 2012-03-02 – 2012-03-07 (×6): 0.4 mg via ORAL
  Filled 2012-03-02 (×6): qty 1

## 2012-03-02 MED ORDER — LEVOFLOXACIN IN D5W 500 MG/100ML IV SOLN
500.0000 mg | Freq: Once | INTRAVENOUS | Status: AC
  Administered 2012-03-02: 500 mg via INTRAVENOUS
  Filled 2012-03-02: qty 100

## 2012-03-02 NOTE — ED Notes (Addendum)
Reports woke up this am with left leg bright  red, swollen, & hot to touch unable to palpate/doppler  dorsalis pedal pulse. Left popliteal pulse present, rated pain scale 9/10. Family stated patient was doing water aerobic on Wed. Thought he sprained, swollen   & bruised left leg,  unable to weight bear on Wed. Started elevating, ice on & off, wrapped with ACE bandage & removed at night. Thurs. Pain started running up left leg.  However, left leg got worse overnight.    Seen @ Urgent Care xray of leg done & sent to ED.

## 2012-03-02 NOTE — ED Provider Notes (Signed)
History     CSN: [id number]  Arrival date & time 03/02/12  1221   First MD Initiated Contact with Patient 03/02/12 1307      Chief Complaint  Patient presents with  . Leg Pain    HPI Pt noticed swelling and redness a few days ago. A few months ago he sustained a small abrasion to his shin.  Initially they thought it was a sprained ankle.  Last night it started getting red and swollen.  He went to an urgent care and they did not see any abnormality on xray but was referred to the ED for treatment of cellulitis.  The redness has been progressing up the leg.  No known fevers.  No vomiting or diarrhea.  He has history of DVT and is on xarelto. Past Medical History  Diagnosis Date  . PVD (peripheral vascular disease)   . CAD (coronary artery disease)     a. 12/2005 - PCI to OM  . Hyperlipidemia   . Cerebrovascular accident   . DVT femoral (deep venous thrombosis) with thrombophlebitis   . GERD (gastroesophageal reflux disease)   . Edema   . Anxiety   . Vertigo   . IBS (irritable bowel syndrome)   . Duodenitis   . Gastritis   . Esophageal stricture   . Cellulitis   . Pulmonary embolism   . Prostatic hypertrophy   . Frequent falls   . Subdural hematoma   . PTSD (post-traumatic stress disorder)   . Vertigo   . TIA (transient ischemic attack)     a. 01/2006 and 05/2006.   [name] Hiatal hernia     periferal neuropathy  . Hx of echocardiogram     Echo 9/13:  Mild LVH, EF 60-65%, Gr 1 diast dysfn, mild AI, mild BAE    Past Surgical History  Procedure Date  . Hand surgery   . Angioplasty   . Carotid endarterectomy     Left  . Bare-metal stenting     left circumflex  . Bilateral cataract surgery     Family History  Problem Relation Age of Onset  . Colon cancer Neg Hx   . Coronary artery disease Brother   . Heart attack Father     History  Substance Use Topics  . Smoking status: Never Smoker   . Smokeless tobacco: Never Used  . Alcohol Use: Yes     Comment: Wine  occasional      Review of Systems  All other systems reviewed and are negative.    Allergies  Amoxicillin-pot clavulanate; Fludrocortisone acetate; and Scopace  Home Medications   Current Outpatient Rx  Name  Route  Sig  Dispense  Refill  . ALPRAZOLAM 0.25 MG PO TABS   Oral   Take 0.25 mg by mouth at bedtime as needed. anxiety         . VITAMIN D 1000 UNITS PO TABS   Oral   Take 1,000 Units by mouth daily.         [name] DOCUSATE SODIUM 100 MG PO CAPS   Oral   Take 100 mg by mouth at bedtime.         . DULOXETINE HCL 30 MG PO CPEP   Oral   Take 30 mg by mouth at bedtime.         [name] GABAPENTIN 300 MG PO CAPS   Oral   Take 600 mg by mouth 2 (two) times daily.          [name]  HYDROCODONE-ACETAMINOPHEN 5-325 MG PO TABS   Oral   Take 1 tablet by mouth every 6 (six) hours as needed. Pain          . LATANOPROST 0.005 % OP SOLN   Both Eyes   Place 1 drop into both eyes at bedtime.         [name] MECLIZINE HCL 12.5 MG PO TABS   Oral   Take 12.5 mg by mouth 3 (three) times daily as needed. For dizziness.         [name] NITROGLYCERIN 0.4 MG SL SUBL   Sublingual   Place 0.4 mg under the tongue every 5 (five) minutes as needed. Chest pain         . OMEPRAZOLE 20 MG PO CPDR   Oral   Take 20 mg by mouth 2 (two) times daily before a meal.          . POLYETHYLENE GLYCOL 3350 PO POWD   Oral   Take 17 g by mouth at bedtime. Constipation          . PROMETHAZINE HCL 25 MG PO TABS   Oral   Take 25 mg by mouth every 6 (six) hours as needed. nausea         . RIVAROXABAN 15 MG PO TABS   Oral   Take 1 tablet (15 mg total) by mouth daily with supper.   30 tablet   6   . TAMSULOSIN HCL 0.4 MG PO CAPS   Oral   Take 0.4 mg by mouth daily.          [name] ZOLPIDEM TARTRATE 5 MG PO TABS   Oral   Take 5 mg by mouth at bedtime as needed. For insomnia.           BP 119/68  Pulse 73  Temp 98.4 F (36.9 C) (Oral)  Resp 14  SpO2 95%  Physical Exam  Nursing note  and vitals reviewed. Constitutional: No distress.       Elderly  HENT:  Head: Normocephalic and atraumatic.  Right Ear: External ear normal.  Left Ear: External ear normal.  Eyes: Conjunctivae normal are normal. Right eye exhibits no discharge. Left eye exhibits no discharge. No scleral icterus.  Neck: Neck supple. No tracheal deviation present.  Cardiovascular: Normal rate, regular rhythm and intact distal pulses.   Pulmonary/Chest: Effort normal and breath sounds normal. No stridor. No respiratory distress. He has no wheezes. He has no rales.  Abdominal: Soft. Bowel sounds are normal. He exhibits no distension. There is no tenderness. There is no rebound and no guarding.  Musculoskeletal: He exhibits edema and tenderness.       Left lower leg: He exhibits tenderness, swelling and edema. He exhibits no deformity.       Healing scab proximal shin, erythematous extending up towards the knee, tenderness palpation of the foot ankle and anterior aspect of the lower leg on the left side  Neurological: He is alert. He has normal strength. No sensory deficit. Cranial nerve deficit:  no gross defecits noted. He exhibits normal muscle tone. He displays no seizure activity. Coordination normal.  Skin: Skin is warm and dry. No rash noted.  Psychiatric: He has a normal mood and affect.    ED Course  Procedures (including critical care time)  Labs Reviewed  CBC WITH DIFFERENTIAL - Abnormal; Notable for the following:    WBC 14.2 (*)     Neutro Abs 10.7 (*)     Monocytes Absolute  1.7 (*)     All other components within normal limits  BASIC METABOLIC PANEL - Abnormal; Notable for the following:    Sodium 133 (*)     Glucose, Bld 134 (*)     BUN 25 (*)     Creatinine, Ser 1.63 (*)     GFR calc non Af Amer 35 (*)     GFR calc Af Amer 41 (*)     All other components within normal limits   No results found.   1. Cellulitis of left lower leg       MDM  Patient appears to have a cellulitis  of his left lower extremity. He has had a wound on his chin for several months that has been slowly healing. I suspect this is the source of his infection .  The patient does have history of DVT in the past. He ordered he has an IVC filter and is currently anticoagulated with xarelto.   Considering that he is already receiving maximal therapy for DVT I do not feel that imaging is necessary at this time unless he were not to respond to IV antibiotics        Celene Kras, MD 03/02/12 1443

## 2012-03-02 NOTE — ED Notes (Signed)
MD at bedside. 

## 2012-03-02 NOTE — ED Notes (Signed)
RN to obtain labs with start of IV 

## 2012-03-02 NOTE — Progress Notes (Signed)
ANTIBIOTIC CONSULT NOTE - INITIAL  Pharmacy Consult for Vancomycin, Levaquin Indication: cellulitis  Allergies  Allergen Reactions  . Amoxicillin-Pot Clavulanate Nausea And Vomiting    daughter states he can tolerate Amoxicillin ok  . Fludrocortisone Acetate Other (See Comments)    unknown  . Scopace (Scopolamine) Other (See Comments)    *patch Unknown reaction    Patient Measurements:   Adjusted Body Weight: 83 kg last documented 12/6/213  Vital Signs: Temp: 99 F (37.2 C) (12/21 1445) Temp src: Oral (12/21 1445) BP: 121/61 mmHg (12/21 1445) Pulse Rate: 67  (12/21 1445) Intake/Output from previous day:   Intake/Output from this shift: Total I/O In: 100 [I.V.:100] Out: 110 [Urine:110]  Labs:  Basename 03/02/12 1353  WBC 14.2*  HGB 14.1  PLT 177  LABCREA --  CREATININE 1.63*   The CrCl is unknown because both a height and weight (above a minimum accepted value) are required for this calculation. No results found for this basename: VANCOTROUGH:2,VANCOPEAK:2,VANCORANDOM:2,GENTTROUGH:2,GENTPEAK:2,GENTRANDOM:2,TOBRATROUGH:2,TOBRAPEAK:2,TOBRARND:2,AMIKACINPEAK:2,AMIKACINTROU:2,AMIKACIN:2, in the last 72 hours   Medical History: Past Medical History  Diagnosis Date  . PVD (peripheral vascular disease)   . CAD (coronary artery disease)     a. 12/2005 - PCI to OM  . Hyperlipidemia   . Cerebrovascular accident   . DVT femoral (deep venous thrombosis) with thrombophlebitis   . GERD (gastroesophageal reflux disease)   . Edema   . Anxiety   . Vertigo   . IBS (irritable bowel syndrome)   . Duodenitis   . Gastritis   . Esophageal stricture   . Cellulitis   . Pulmonary embolism   . Prostatic hypertrophy   . Frequent falls   . Subdural hematoma   . PTSD (post-traumatic stress disorder)   . Vertigo   . TIA (transient ischemic attack)     a. 01/2006 and 05/2006.   [name] Hiatal hernia     periferal neuropathy  . Hx of echocardiogram     Echo 9/13:  Mild LVH, EF  60-65%, Gr 1 diast dysfn, mild AI, mild BAE    Assessment:  Jason [name] presented 12/21 to Urgent Care with LLE swelling and redness.  Xray at Urgent Care unrevealing, directed to come to ED for treatment of cellulitis. MD starting Levaquin and Vancomycin per pharmacy.  Patient received Levaquin 500 mg IV x 1 at 1438 in the ED.   Afebrile, WBC 14.2 k, last reported wt was 83 kg on 02/16/2012.  Scr 1.63, CG CrCl 33 ml/min, normalized CrCl = 30 ml/min.   Goal of Therapy:  Vancomycin trough level 10-15 mcg/ml Appropriate renal adjustment of Levaquin  Plan:   Vancomycin 1gm IV q24h to start now  Levaquin 750 mg IV q48h to start 12/23 @ 0600  Pharmacy will f/u  Geoffry Paradise, PharmD, BCPS Pager: [phone] 3:53 PM Pharmacy #: [id number]

## 2012-03-02 NOTE — ED Notes (Signed)
Pt presents w/ left lower leg red, hot, painful, cellulitic appearance. C/o pain in left ankle after water aerobics on Wednesday and did have bruising at the ankle per daughter, but foot and leg did not have inflamed and swollen appearance until this morning. Urgent care sent the pt here.

## 2012-03-02 NOTE — H&P (Signed)
[facility] History and Physical  Jason [name] ZOX:[id number] DOB: 03/27/1919 DOA: 03/02/2012   PCP: Leo Grosser, MD  Specialists: His cardiologist is Dr. Excell Seltzer  Chief Complaint: Left leg pain  HPI: Jason [name] is a 78 y.o. male with a past medical history of coronary artery disease, history of DVTs, paroxysmal atrial fibrillation, BPH, who was in his usual state of health till Wednesday, when [facility]. Patient feels, that he may have bumped his left leg against the wall at that time. He noticed that his ankle was hurting. Patient however, ignored the symptoms. His son offered to take him to the hospital on Thursday. However, the patient refused. Subsequently, the left leg started swelling up. It became red and was warm to touch. He also had pain in that leg which was getting worse. He could not ambulate because of pain. The pain was about 5 or 10 in intensity and was described as a sharp pain. He also has an abrasion on the left shin area, which he cannot recall how it occurred. He thinks that may have happened in the pool as well. He denies any chills. However, he was hot to touch yesterday and may have had a fever overnight. Denies any nausea, vomiting. Denies any chest pain or shortness of breath. He does have chronic dyspnea. Patient denies missing any of his medication doses. Patient has been hospitalized twice within the last 3 months. He was admitted for atrial fibrillation with RVR back in September. This was complicated by Foley induced trauma and hematuria. His anticoagulation was discontinued. And, then sometime in October he was placed back on his Xarelto. He was readmitted in October for a UTI secondary to Foley catheter. He does have a history of BPH, and has difficulty urinating at times. Patient was taken to the urgent care center today by his son where they did x-rays which did not reveal any ankle fracture and then he was  sent over to the ED for management of cellulitis.  Home Medications: Prior to Admission medications   Medication Sig Start Date End Date Taking? Authorizing Provider  ALPRAZolam (XANAX) 0.25 MG tablet Take 0.25 mg by mouth at bedtime as needed. anxiety   Yes Historical Provider, MD  cholecalciferol (VITAMIN D) 1000 UNITS tablet Take 1,000 Units by mouth daily.   Yes Historical Provider, MD  docusate sodium (COLACE) 100 MG capsule Take 100 mg by mouth at bedtime.   Yes Historical Provider, MD  DULoxetine (CYMBALTA) 30 MG capsule Take 30 mg by mouth at bedtime.   Yes Historical Provider, MD  gabapentin (NEURONTIN) 300 MG capsule Take 600 mg by mouth 2 (two) times daily.    Yes Historical Provider, MD  HYDROcodone-acetaminophen (NORCO) 5-325 MG per tablet Take 1 tablet by mouth every 6 (six) hours as needed. Pain    Yes Historical Provider, MD  latanoprost (XALATAN) 0.005 % ophthalmic solution Place 1 drop into both eyes at bedtime.   Yes Historical Provider, MD  meclizine (ANTIVERT) 12.5 MG tablet Take 12.5 mg by mouth 3 (three) times daily as needed. For dizziness.   Yes Historical Provider, MD  nitroGLYCERIN (NITROSTAT) 0.4 MG SL tablet Place 0.4 mg under the tongue every 5 (five) minutes as needed. Chest pain   Yes Historical Provider, MD  omeprazole (PRILOSEC) 20 MG capsule Take 20 mg by mouth 2 (two) times daily before a meal.    Yes Historical Provider, MD  polyethylene glycol powder (MIRALAX)  powder Take 17 g by mouth at bedtime. Constipation    Yes Historical Provider, MD  promethazine (PHENERGAN) 25 MG tablet Take 25 mg by mouth every 6 (six) hours as needed. nausea   Yes Historical Provider, MD  Rivaroxaban (XARELTO) 15 MG TABS tablet Take 1 tablet (15 mg total) by mouth daily with supper. 01/08/12  Yes Beatrice Lecher, PA  Tamsulosin HCl (FLOMAX) 0.4 MG CAPS Take 0.4 mg by mouth daily.    Yes Historical Provider, MD  zolpidem (AMBIEN) 5 MG tablet Take 5 mg by mouth at bedtime as needed.  For insomnia.   Yes Historical Provider, MD    Allergies:  Allergies  Allergen Reactions  . Amoxicillin-Pot Clavulanate Nausea And Vomiting    daughter states he can tolerate Amoxicillin ok  . Fludrocortisone Acetate Other (See Comments)    unknown  . Scopace (Scopolamine) Other (See Comments)    *patch Unknown reaction    Past Medical History: Past Medical History  Diagnosis Date  . PVD (peripheral vascular disease)   . CAD (coronary artery disease)     a. 12/2005 - PCI to OM  . Hyperlipidemia   . Cerebrovascular accident   . DVT femoral (deep venous thrombosis) with thrombophlebitis   . GERD (gastroesophageal reflux disease)   . Edema   . Anxiety   . Vertigo   . IBS (irritable bowel syndrome)   . Duodenitis   . Gastritis   . Esophageal stricture   . Cellulitis   . Pulmonary embolism   . Prostatic hypertrophy   . Frequent falls   . Subdural hematoma   . PTSD (post-traumatic stress disorder)   . Vertigo   . TIA (transient ischemic attack)     a. 01/2006 and 05/2006.   [name] Hiatal hernia     periferal neuropathy  . Hx of echocardiogram     Echo 9/13:  Mild LVH, EF 60-65%, Gr 1 diast dysfn, mild AI, mild BAE    Past Surgical History  Procedure Date  . Hand surgery   . Angioplasty   . Carotid endarterectomy     Left  . Bare-metal stenting     left circumflex  . Bilateral cataract surgery     Social History:  reports that he has never smoked. He has never used smokeless tobacco. He reports that he drinks alcohol. He reports that he does not use illicit drugs.  Living Situation: Lives in [place] with his wife, who has Alzheimer's dementia. Patient denies smoking or alcohol use. Activity Level: He has limited mobility. He uses a cane and/or walker to ambulate. It appears, that his level of functioning has decreased over the last few months.   Family History:  Family History  Problem Relation Age of Onset  . Colon cancer Neg Hx   . Coronary artery disease  Brother   . Heart attack Father      Review of Systems - History obtained from the patient and son General ROS: positive for  - fatigue Psychological ROS: negative Ophthalmic ROS: negative ENT ROS: negative Allergy and Immunology ROS: negative Hematological and Lymphatic ROS: negative Endocrine ROS: negative Respiratory ROS: negative Cardiovascular ROS: negative Gastrointestinal ROS: negative Genito-Urinary ROS: occasional difficulty with urination Musculoskeletal ROS: negative Neurological ROS: no TIA or stroke symptoms Dermatological ROS: redness in left leg  Physical Examination  Filed Vitals:   03/02/12 1238 03/02/12 1445  BP: 119/68 121/61  Pulse: 73 67  Temp: 98.4 F (36.9 C) 99 F (37.2 C)  TempSrc:  Oral Oral  Resp: 14   SpO2: 95%     General appearance: alert, cooperative, appears stated age and no distress Head: Normocephalic, without obvious abnormality, atraumatic Eyes: conjunctivae/corneas clear. PERRL, EOM's intact.  Throat: slightly dry mucous membranes Neck: no adenopathy, no carotid bruit, no JVD, supple, symmetrical, trachea midline and thyroid not enlarged, symmetric, no tenderness/mass/nodules Resp: clear to auscultation bilaterally Cardio: regular rate and rhythm, S1, S2 normal, no murmur, click, rub or gallop GI: soft, non-tender; bowel sounds normal; no masses,  no organomegaly Extremities: left leg is swollen, erythematous, tender to palpation and warm to touch. Good pulses Pulses: 2+ and symmetric Skin: erythema left leg Lymph nodes: Cervical, supraclavicular, and axillary nodes normal. Neurologic: Alert and oriented x 3. No focal deficits  Laboratory Data: Results for orders placed during the hospital encounter of 03/02/12 (from the past 48 hour(s))  CBC WITH DIFFERENTIAL     Status: Abnormal   Collection Time   03/02/12  1:53 PM      Component Value Range Comment   WBC 14.2 (*) 4.0 - 10.5 K/uL    RBC 4.26  4.22 - 5.81 MIL/uL     Hemoglobin 14.1  13.0 - 17.0 g/dL    HCT 16.1  09.6 - 04.5 %    MCV 96.7  78.0 - 100.0 fL    MCH 33.1  26.0 - 34.0 pg    MCHC 34.2  30.0 - 36.0 g/dL    RDW 40.9  81.1 - 91.4 %    Platelets 177  150 - 400 K/uL    Neutrophils Relative 75  43 - 77 %    Neutro Abs 10.7 (*) 1.7 - 7.7 K/uL    Lymphocytes Relative 13  12 - 46 %    Lymphs Abs 1.8  0.7 - 4.0 K/uL    Monocytes Relative 12  3 - 12 %    Monocytes Absolute 1.7 (*) 0.1 - 1.0 K/uL    Eosinophils Relative 0  0 - 5 %    Eosinophils Absolute 0.0  0.0 - 0.7 K/uL    Basophils Relative 0  0 - 1 %    Basophils Absolute 0.0  0.0 - 0.1 K/uL   BASIC METABOLIC PANEL     Status: Abnormal   Collection Time   03/02/12  1:53 PM      Component Value Range Comment   Sodium 133 (*) 135 - 145 mEq/L    Potassium 3.9  3.5 - 5.1 mEq/L    Chloride 98  96 - 112 mEq/L    CO2 24  19 - 32 mEq/L    Glucose, Bld 134 (*) 70 - 99 mg/dL    BUN 25 (*) 6 - 23 mg/dL    Creatinine, Ser 7.82 (*) 0.50 - 1.35 mg/dL    Calcium 9.0  8.4 - 95.6 mg/dL    GFR calc non Af Amer 35 (*) >90 mL/min    GFR calc Af Amer 41 (*) >90 mL/min     Radiology Reports: No results found.  Problem List  Principal Problem:  *Cellulitis of left leg Active Problems:  HYPERTENSION, BENIGN  CORONARY ARTERY DISEASE  CKD (chronic kidney disease), stage III  PAF (paroxysmal atrial fibrillation)  History of DVT (deep vein thrombosis)  Dehydration   Assessment: This is a 78 year old, Caucasian male, with a multiple medical problems as stated earlier, who presents with the pain and swelling in his left leg. Considering his leukocytosis and examination this appears to be cellulitis. Differential  diagnosis includes deep venous thrombosis. Apparently, he had imaging studies done at urgent care and there was no evidence for fracture in his ankle according to the patient's son.  Plan: #1 cellulitis of the left leg: He'll be treated with the vancomycin, and Levaquin. Blood cultures will be  obtained if he develops fever. Pain control will be provided. To rule out DVT will get a venous. Physical and occupational therapy will be involved in his care.  #2 mild dehydration: Gentle IV hydration will be provided.  #3 chronic kidney disease, with mild acute renal failure: Most likely due to the above. Give gentle IV hydration and repeat his renal function in the morning.  #4 history of paroxysmal A. fib: He doesn't appear to be on any rate limiting agents at this time. He used to be on amiodarone, however, based on the last cardiology note, he is no longer on that medication.  #5 history of DVTs on chronic anticoagulation: He has interruptions to anticoagulation however, now he is back on Xarelto. He also had an IVC filter placed in September of this year when he had to be taken off of his anticoagulation.  #6 history of coronary artery disease appears to be stable.  DVT prophylaxis. He already is on therapeutic anticoagulation.  Further management decisions will depend on results of further testing and patient's response to treatment.  Code Status: Discussed in detail with patient and his son. He will be a full code as he has previously requested. Family Communication: Discussed with patient and his son in detail  Disposition Plan: Will likely return home when improved.   [facility]  [facility] Pager [phone]  If 7PM-7AM, please contact night-coverage [email or web address] Password [facility]  03/02/2012, 3:24 PM

## 2012-03-03 DIAGNOSIS — M79609 Pain in unspecified limb: Secondary | ICD-10-CM

## 2012-03-03 DIAGNOSIS — M7989 Other specified soft tissue disorders: Secondary | ICD-10-CM

## 2012-03-03 DIAGNOSIS — I2510 Atherosclerotic heart disease of native coronary artery without angina pectoris: Secondary | ICD-10-CM

## 2012-03-03 LAB — COMPREHENSIVE METABOLIC PANEL
ALT: 12 U/L (ref 0–53)
Albumin: 2.6 g/dL — ABNORMAL LOW (ref 3.5–5.2)
Alkaline Phosphatase: 85 U/L (ref 39–117)
Calcium: 8.3 mg/dL — ABNORMAL LOW (ref 8.4–10.5)
Potassium: 3.6 mEq/L (ref 3.5–5.1)
Sodium: 135 mEq/L (ref 135–145)
Total Protein: 6.3 g/dL (ref 6.0–8.3)

## 2012-03-03 LAB — CBC
HCT: 36.1 % — ABNORMAL LOW (ref 39.0–52.0)
Hemoglobin: 12.2 g/dL — ABNORMAL LOW (ref 13.0–17.0)
MCHC: 33.8 g/dL (ref 30.0–36.0)
MCV: 94.8 fL (ref 78.0–100.0)
RDW: 14.8 % (ref 11.5–15.5)

## 2012-03-03 MED ORDER — SODIUM CHLORIDE 0.9 % IV SOLN
INTRAVENOUS | Status: DC
  Administered 2012-03-03: 09:00:00 via INTRAVENOUS
  Administered 2012-03-05: 20 mL via INTRAVENOUS

## 2012-03-03 NOTE — Progress Notes (Signed)
Pt noted with pink tinged urine.  MD notified of new onset of blood in urine.  MD stated to monitor pt for now and continue fluids.  MD will be up to see pt today.

## 2012-03-03 NOTE — Progress Notes (Signed)
VASCULAR LAB PRELIMINARY  PRELIMINARY  PRELIMINARY  PRELIMINARY  Left lower extremity venous Doppler completed.    Preliminary report:  There is no DVT or SVT noted in the left lower extremity.  There are enlarged lymph nodes noted in the left groin.  Quinnton Bury, RVT 03/03/2012, 10:07 AM

## 2012-03-03 NOTE — Progress Notes (Signed)
Rehab Admissions Coordinator Note:  Patient was screened by Clois Dupes for appropriateness for an Inpatient Acute Rehab Consult. [employer] Medicare will not approve an inpt rehab admission for this diagnosis. At this time, we are recommending Skilled Nursing Facility or home with home health and family support.  Clois Dupes, RN 03/03/2012, 12:41 PM  I can be reached at [phone].

## 2012-03-03 NOTE — Evaluation (Signed)
Physical Therapy Evaluation Patient Details Name: Jason [name] MRN: [id number] DOB: Mar 03, 1920 Today's Date: 03/03/2012 Time: 2536-6440 PT Time Calculation (min): 20 min  PT Assessment / Plan / Recommendation Clinical Impression  Pt admitted for L LE cellulitis.  Pt would benefit from acute PT services in order to improve independence with bed mobility, transfers, and ambulation and decrease pain with mobility to prepare for d/c.  Pt unable to tolerate standing long and declined attempting ambulating due to 10/10 L LE pain .  Pt would benefit from ST-rehab prior to d/c home    PT Assessment  Patient needs continued PT services    Follow Up Recommendations  CIR;SNF;Supervision/Assistance - 24 hour (if not CIR, SNF)    Does the patient have the potential to tolerate intense rehabilitation      Barriers to Discharge        Equipment Recommendations  Other (comment) (TBA)    Recommendations for Other Services     Frequency Min 3X/week    Precautions / Restrictions Precautions Precautions: Fall Restrictions Weight Bearing Restrictions: No   Pertinent Vitals/Pain Premedicated for therapy with IV morphine, 5/10 L LE pain at rest, increased to 10/10 with mobility      Mobility  Bed Mobility Bed Mobility: Supine to Sit;Sit to Supine Supine to Sit: 3: Mod assist;HOB elevated Sit to Supine: 4: Min assist Details for Bed Mobility Assistance: assist for trunk to rise and support for L LE onto bed Transfers Transfers: Sit to Stand;Stand to Sit Sit to Stand: 3: Mod assist;With upper extremity assist;From bed;From elevated surface Stand to Sit: 4: Min assist;With upper extremity assist;To bed;To elevated surface Details for Transfer Assistance: verbal cues for technique, pt TDWB and reports too painful to put weight down through L LE, encouraged use of R LE and UEs through RW to decrease pain with transfers and standing, pt unable to hop so sliding foot on floor to go up head of bed  with verbal cues and assist with RW Ambulation/Gait Ambulation/Gait Assistance: Not tested (comment)    Shoulder Instructions     Exercises     PT Diagnosis: Difficulty walking;Acute pain  PT Problem List: Decreased strength;Decreased activity tolerance;Decreased mobility;Decreased balance;Decreased safety awareness;Decreased knowledge of use of DME;Pain PT Treatment Interventions: DME instruction;Gait training;Functional mobility training;Patient/family education;Therapeutic activities;Therapeutic exercise;Balance training;Neuromuscular re-education   PT Goals Acute Rehab PT Goals PT Goal Formulation: With patient Time For Goal Achievement: 03/17/12 Potential to Achieve Goals: Good Pt will go Supine/Side to Sit: with supervision PT Goal: Supine/Side to Sit - Progress: Goal set today Pt will go Sit to Supine/Side: with supervision PT Goal: Sit to Supine/Side - Progress: Goal set today Pt will go Sit to Stand: with supervision PT Goal: Sit to Stand - Progress: Goal set today Pt will go Stand to Sit: with supervision PT Goal: Stand to Sit - Progress: Goal set today Pt will Ambulate: 16 - 50 feet;with supervision;with least restrictive assistive device PT Goal: Ambulate - Progress: Goal set today  Visit Information  Last PT Received On: 03/03/12 Assistance Needed: +1    Subjective Data  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 Prior Function Level of Independence: Independent Comments: Pt reports he does not like using assistive device even though he knows his balance  is not good (per son) Communication Communication: HOH    Cognition  Overall Cognitive Status: Appears within functional limits for tasks  assessed/performed Arousal/Alertness: Awake/alert    Extremity/Trunk Assessment Right Upper Extremity Assessment RUE ROM/Strength/Tone: WFL for tasks assessed Left Upper Extremity Assessment LUE ROM/Strength/Tone: WFL for tasks assessed Right Lower Extremity Assessment RLE ROM/Strength/Tone: Deficits RLE ROM/Strength/Tone Deficits: observed functional weakness Left Lower Extremity Assessment LLE ROM/Strength/Tone: Deficits;Unable to fully assess;Due to pain LLE ROM/Strength/Tone Deficits: red swollen lower leg, pt able to lift against gravity however unable to take weight due to pain   Balance    End of Session PT - End of Session Equipment Utilized During Treatment: Gait belt Activity Tolerance: Patient limited by pain;Patient limited by fatigue Patient left: in bed;with call bell/phone within reach;with nursing in room Nurse Communication: Patient requests pain meds (RN medicated for therapy with IV meds)  GP     Jason [name],KATHrine E 03/03/2012, 11:49 AM Pager: [phone]

## 2012-03-03 NOTE — Progress Notes (Signed)
[facility] PROGRESS NOTE  Jason [name] ZOX:[id number] DOB: Mar 25, 1919 DOA: 03/02/2012 PCP: Lynnea Ferrier TOM, MD  Assessment/Plan: Cellulitis of the left leg Continue vancomycin and Levaquin.  Elevated left lower extremity while in bed or sitting.  Left lower extremity venous Doppler on 03/03/2012 showed no DVT or SVT.  Will need blood cultures if he develops fever.  Continue physical and occupational therapy.  Mild dehydration Resolved with IV hydration.  Chronic kidney disease, with mild acute renal failure Renal function at baseline.  Improved with mild hydration.  Left lower extremity edema Likely due to cellulitis.  Minimal IV fluids.  History of paroxysmal A. Fib Rate controlled.  Not on any rate limiting agents, was also on amiodarone, he is no longer on this medication based on cardiology's last note.  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 He also had an IVC filter placed in September of this year when he had to be taken off of his anticoagulation.   History of coronary artery disease Stable.  Leukocytosis Improved, likely due to cellulitis.  Hyponatremia Improved, continue to monitor.  Generalized weakness Physical therapy recommending SNF versus CIR.  Hematuria Patient reports that he had a Foley catheter yesterday and had a traumatic accidental removal.  No nursing documentation in the emergency department except patient needing condom catheter, improving per nursing.  Complicated by chronic anticoagulation.  Continue to monitor.  DVT prophylaxis Rivaroxaban.  Code Status: Full code.  Family Communication: Discussed with patient. No family at bedside.  Disposition Plan: Pending.  Consultants:  Physical therapy  Procedures: Left lower extremity venous Doppler on 03/03/2012 showed no DVT or SVT.  Antibiotics:  Vancomycin 03/02/2012 >>  Levofloxacin 03/02/2012  >>  HPI/Subjective: Left lower extremity still quite swollen as yesterday, not any different.  Had hematuria since during the night which has improved.  Objective: Filed Vitals:   03/02/12 1500 03/02/12 1540 03/02/12 2130 03/03/12 0530  BP:  173/75 149/67 160/73  Pulse:  68 75 67  Temp:  98 F (36.7 C) 98.9 F (37.2 C) 98.1 F (36.7 C)  TempSrc:  Oral Oral Oral  Resp:   18 18  Height: 6' 0.05" (1.83 m)     Weight: 83 kg (182 lb 15.7 oz)     SpO2:  98% 92% 94%    Intake/Output Summary (Last 24 hours) at 03/03/12 1145 Last data filed at 03/03/12 1100  Gross per 24 hour  Intake   1600 ml  Output   1055 ml  Net    545 ml   Filed Weights   03/02/12 1500  Weight: 83 kg (182 lb 15.7 oz)    Exam: Physical Exam: General: Awake, Oriented, No acute distress. HEENT: EOMI. Neck: Supple CV: S1 and S2 Lungs: Clear to ascultation bilaterally Abdomen: Soft, Nontender, Nondistended, +bowel sounds. Ext: Good pulses. 2+ LLE edema, with erythema all the way up to the knee.  Data Reviewed: Basic Metabolic Panel:  Lab 03/03/12 8119 03/02/12 1353  NA 135 133*  K 3.6 3.9  CL 103 98  CO2 22 24  GLUCOSE 93 134*  BUN 24* 25*  CREATININE 1.47* 1.63*  CALCIUM 8.3* 9.0  MG -- --  PHOS -- --   Liver Function Tests:  Lab 03/03/12 0446  AST 15  ALT 12  ALKPHOS 85  BILITOT 1.0  PROT 6.3  ALBUMIN 2.6*   No results found for this basename: LIPASE:5,AMYLASE:5 in the last 168 hours No  results found for this basename: AMMONIA:5 in the last 168 hours CBC:  Lab 03/03/12 0446 03/02/12 1353  WBC 11.2* 14.2*  NEUTROABS -- 10.7*  HGB 12.2* 14.1  HCT 36.1* 41.2  MCV 94.8 96.7  PLT 177 177   Cardiac Enzymes: No results found for this basename: CKTOTAL:5,CKMB:5,CKMBINDEX:5,TROPONINI:5 in the last 168 hours BNP (last 3 results) No results found for this basename: PROBNP:3 in the last 8760 hours CBG: No results found for this basename: GLUCAP:5 in the last 168 hours  No results  found for this or any previous visit (from the past 240 hour(s)).   Studies: No results found.  Scheduled Meds:   . docusate sodium  100 mg Oral QHS  . DULoxetine  30 mg Oral QHS  . gabapentin  600 mg Oral BID  . latanoprost  1 drop Both Eyes QHS  . levofloxacin (LEVAQUIN) IV  750 mg Intravenous Q48H  . pantoprazole  40 mg Oral BID  . polyethylene glycol  17 g Oral QHS  . Rivaroxaban  15 mg Oral Q supper  . Tamsulosin HCl  0.4 mg Oral Daily  . vancomycin  1,000 mg Intravenous Q24H   Continuous Infusions:   . sodium chloride 100 mL/hr at 03/03/12 0845    Principal Problem:  *Cellulitis of left leg Active Problems:  HYPERTENSION, BENIGN  CORONARY ARTERY DISEASE  CKD (chronic kidney disease), stage III  PAF (paroxysmal atrial fibrillation)  History of DVT (deep vein thrombosis)  Dehydration    Time spent: 25 mins    Kebron Pulse A  [facility] Pager [phone]. If 8PM-8AM, please contact night-coverage at [email or web address], password [facility] 03/03/2012, 11:45 AM  LOS: 1 day

## 2012-03-04 DIAGNOSIS — I10 Essential (primary) hypertension: Secondary | ICD-10-CM

## 2012-03-04 NOTE — Progress Notes (Signed)
Clinical Social Work Department BRIEF PSYCHOSOCIAL ASSESSMENT 03/04/2012  Patient:  Jason [name], Jason [name]     Account Number:  [id number]     Admit date:  03/02/2012  Clinical Social Worker:  Orpah Greek  Date/Time:  03/04/2012 03:27 PM  Referred by:  Physician  Date Referred:  03/04/2012 Referred for  SNF Placement   Other Referral:   Interview type:  Patient Other interview type:   and daughter    PSYCHOSOCIAL DATA Living Status:  WIFE Admitted from facility:   Level of care:   Primary support name:  Jason [name] (daughter) h#: [phone] Primary support relationship to patient:  CHILD, ADULT Degree of support available:   good    CURRENT CONCERNS Current Concerns  Post-Acute Placement   Other Concerns:    SOCIAL WORK ASSESSMENT / PLAN CSW received consult for SNF placement. CSW spoke with patient & daughter at bedside re: discharge planning. Note PT/MD recommended SNF placement. Daughter informed CSW that patient has 24/7 caregivers at home through [employer].   Assessment/plan status:  No Further Intervention Required Other assessment/ plan:   Information/referral to community resources:   Patient/daughter declined SNF list.    PATIENT'S/FAMILY'S RESPONSE TO PLAN OF CARE: Daughter states that plan is for discharge home with home health & 24/7 caregivers. RNCM, Steward Drone made aware.        Unice Bailey, LCSW [facility] Clinical Social Worker cell #: [phone]

## 2012-03-04 NOTE — Progress Notes (Signed)
Talked to patient/ daughter Jason [name] about DCP; patient/ daughter requested [facility] for [facility] services; Jason Pitter RN with [facility] called for arrangements - Jason [name] is to stop by the patient's room and talk to the daughter about the billing process through [facility] tomorrow; Attending MD, please order HHRN/ PT/ OT/ nurses aide and SW at discharge. B Jason Frankie RN,BSN,MHA.

## 2012-03-04 NOTE — Progress Notes (Signed)
[facility] PROGRESS NOTE  Jason [name] YNW:[id number] DOB: February 23, 1920 DOA: 03/02/2012 PCP: Lynnea Ferrier TOM, MD  Assessment/Plan: Cellulitis of the left leg Continue vancomycin and Levaquin.  Elevated left lower extremity while in bed or sitting.  Left lower extremity venous Doppler on 03/03/2012 showed no DVT or SVT.  Will need blood cultures if he develops fever.  Continue physical and occupational therapy.  Requested wound care consultation as patient has areas on his left lower extremity where some blisters have opened and are weeping.  Mild dehydration Resolved with IV hydration.  Chronic kidney disease, with mild acute renal failure Renal function at baseline.  Improved with mild hydration.  Left lower extremity edema Likely due to cellulitis.  Minimize IV fluids.  Elevate legs as possible.  History of paroxysmal A. Fib Rate controlled.  Not on any rate limiting agents, was also on amiodarone, he is no longer on this medication based on cardiology's last note.  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 He also had an IVC filter placed in September of this year when he had to be taken off of his anticoagulation.   History of coronary artery disease Stable.  Leukocytosis Improved, likely due to cellulitis.  Hyponatremia Improved, continue to monitor.  Generalized weakness Physical therapy recommending SNF, request social work consultation for placement.  Hematuria Patient reports that he had a Foley catheter yesterday and had a traumatic accidental removal.  No nursing documentation in the emergency department except patient needing condom catheter, improving per nursing.  Complicated by chronic anticoagulation.  Continue to monitor.  DVT prophylaxis Rivaroxaban.  Code Status: Full code.  Family Communication: Discussed with patient.  Son also present at bedside.  Disposition Plan:  Pending.  Consultants:  Physical therapy  Procedures: Left lower extremity venous Doppler on 03/03/2012 showed no DVT or SVT.  Antibiotics:  Vancomycin 03/02/2012 >>  Levofloxacin 03/02/2012 >>  HPI/Subjective: Left leg feels improved.  No specific concern.  Objective: Filed Vitals:   03/03/12 0530 03/03/12 1400 03/03/12 2110 03/04/12 0559  BP: 160/73 161/67 150/71 139/65  Pulse: 67 78 73 76  Temp: 98.1 F (36.7 C) 99.1 F (37.3 C) 98.2 F (36.8 C) 99 F (37.2 C)  TempSrc: Oral Oral Oral Oral  Resp: 18 20 18 18   Height:      Weight:      SpO2: 94% 99% 94% 94%    Intake/Output Summary (Last 24 hours) at 03/04/12 1114 Last data filed at 03/04/12 0800  Gross per 24 hour  Intake 1632.5 ml  Output    400 ml  Net 1232.5 ml   Filed Weights   03/02/12 1500  Weight: 83 kg (182 lb 15.7 oz)    Exam: Physical Exam: General: Awake, Oriented, No acute distress. HEENT: EOMI. Neck: Supple CV: S1 and S2 Lungs: Clear to ascultation bilaterally Abdomen: Soft, Nontender, Nondistended, +bowel sounds. Ext: Good pulses. 2+ LLE edema, with erythema all the way up to the knee, leg appears more bruised but not as swollen or red as yesterday.  Leg appears improved compared to yesterday.  Data Reviewed: Basic Metabolic Panel:  Lab 03/03/12 6578 03/02/12 1353  NA 135 133*  K 3.6 3.9  CL 103 98  CO2 22 24  GLUCOSE 93 134*  BUN 24* 25*  CREATININE 1.47* 1.63*  CALCIUM 8.3* 9.0  MG -- --  PHOS -- --   Liver Function Tests:  Lab 03/03/12 0446  AST 15  ALT 12  ALKPHOS 85  BILITOT 1.0  PROT 6.3  ALBUMIN 2.6*   No results found for this basename: LIPASE:5,AMYLASE:5 in the last 168 hours No results found for this basename: AMMONIA:5 in the last 168 hours CBC:  Lab 03/03/12 0446 03/02/12 1353  WBC 11.2* 14.2*  NEUTROABS -- 10.7*  HGB 12.2* 14.1  HCT 36.1* 41.2  MCV 94.8 96.7  PLT 177 177   Cardiac Enzymes: No results found for this basename:  CKTOTAL:5,CKMB:5,CKMBINDEX:5,TROPONINI:5 in the last 168 hours BNP (last 3 results) No results found for this basename: PROBNP:3 in the last 8760 hours CBG: No results found for this basename: GLUCAP:5 in the last 168 hours  No results found for this or any previous visit (from the past 240 hour(s)).   Studies: No results found.  Scheduled Meds:    . docusate sodium  100 mg Oral QHS  . DULoxetine  30 mg Oral QHS  . gabapentin  600 mg Oral BID  . latanoprost  1 drop Both Eyes QHS  . levofloxacin (LEVAQUIN) IV  750 mg Intravenous Q48H  . pantoprazole  40 mg Oral BID  . polyethylene glycol  17 g Oral QHS  . Rivaroxaban  15 mg Oral Q supper  . Tamsulosin HCl  0.4 mg Oral Daily  . vancomycin  1,000 mg Intravenous Q24H   Continuous Infusions:    . sodium chloride 10 mL/hr (03/03/12 1548)    Principal Problem:  *Cellulitis of left leg Active Problems:  HYPERTENSION, BENIGN  CORONARY ARTERY DISEASE  CKD (chronic kidney disease), stage III  PAF (paroxysmal atrial fibrillation)  History of DVT (deep vein thrombosis)  Dehydration    Time spent: 25 mins    Jason [name] A  [facility] Pager [phone]. If 8PM-8AM, please contact night-coverage at [email or web address], password [facility] 03/04/2012, 11:14 AM  LOS: 2 days

## 2012-03-04 NOTE — Progress Notes (Signed)
Physical Therapy Treatment Patient Details Name: Jason [name] MRN: [id number] DOB: [birth year] Today's Date: 03/04/2012 Time: 8295-6213 PT Time Calculation (min): 33 min  PT Assessment / Plan / Recommendation Comments on Treatment Session  Noted pt to have blister on medial side of left ankle.  when attempting to remove pillows from under pts LEs, blister opened.  RN made aware and gauze/dressing applied.  Pt was able to get up to chair, however requires +2 assist for safety at this time.  Continue to recommend SNF for follow up at D/C.     Follow Up Recommendations  SNF;Supervision/Assistance - 24 hour     Does the patient have the potential to tolerate intense rehabilitation     Barriers to Discharge        Equipment Recommendations  Other (comment)    Recommendations for Other Services    Frequency Min 3X/week   Plan Discharge plan remains appropriate    Precautions / Restrictions Precautions Precautions: Fall Precaution Comments: Pt VERY tender on back and medial side of ankle.  Restrictions Weight Bearing Restrictions: No   Pertinent Vitals/Pain 9/10 pain, RN made aware and pain medication provided during session.     Mobility  Bed Mobility Bed Mobility: Supine to Sit Supine to Sit: 1: +2 Total assist Supine to Sit: Patient Percentage: 40% Sit to Supine: 1: +2 Total assist Sit to Supine: Patient Percentage: 40% Details for Bed Mobility Assistance: Assist for LLE out of bed and for trunk to attain sitting position. Max cues for hand placement and technique, however pt with increased difficulty following commands due to pain.  Transfers Transfers: Sit to Stand;Stand to Sit;Stand Pivot Transfers Sit to Stand: From bed;1: +2 Total assist Sit to Stand: Patient Percentage: 40% Stand to Sit: To chair/3-in-1;1: +2 Total assist Stand to Sit: Patient Percentage: 40% Stand Pivot Transfers: 1: +2 Total assist Stand Pivot Transfers: Patient Percentage: 40% Details for  Transfer Assistance: Assist to rise, steady and ensure controlled descent with max cues for hand placement and LE management with sitting/standing.  Pt was able to take some steps from bed to chair, however pt very painful in LLE.  RN aware and provided medication.  Ambulation/Gait Ambulation/Gait Assistance: Not tested (comment)    Exercises     PT Diagnosis:    PT Problem List:   PT Treatment Interventions:     PT Goals Acute Rehab PT Goals PT Goal Formulation: With patient Time For Goal Achievement: 03/17/12 Potential to Achieve Goals: Good Pt will go Supine/Side to Sit: with supervision PT Goal: Supine/Side to Sit - Progress: Progressing toward goal Pt will go Sit to Stand: with supervision PT Goal: Sit to Stand - Progress: Progressing toward goal Pt will go Stand to Sit: with supervision PT Goal: Stand to Sit - Progress: Progressing toward goal Pt will Ambulate: 16 - 50 feet;with supervision;with least restrictive assistive device PT Goal: Ambulate - Progress: Progressing toward goal  Visit Information  Last PT Received On: 03/04/12 Assistance Needed: +2    Subjective Data  Subjective: My leg just hurts so bad.    Cognition  Overall Cognitive Status: Appears within functional limits for tasks assessed/performed Arousal/Alertness: Awake/alert Orientation Level: Appears intact for tasks assessed Behavior During Session: [name] for tasks performed    Balance     End of Session PT - End of Session Equipment Utilized During Treatment: Gait belt Activity Tolerance: Patient limited by pain;Patient limited by fatigue Patient left: in chair;with call bell/phone within reach;with nursing in room Nurse  Communication: Patient requests pain meds   GP     Page, Meribeth Mattes 03/04/2012, 11:00 AM

## 2012-03-04 NOTE — Consult Note (Signed)
WOC consult Note Reason for Consult: Consult requested for left leg wound.  Pt bumped in a pool several days ago and cellulitis occurred.  This has now blistered and peeled and evolved into partial thickness wound to ankle.  Right leg has partial thickness abrasion from bumping into another object.  Wound type:Right calf partial thickness wound 2X1.5X.2cm, skin approximated over 90% of wound.  Dry red wound bed. Left ankle with partial thickness wound 8X5X.1cm, moist red wound bed, dry peeling skin surrounding.  Generalized erythremia and edema to left foot and calf.  Expect other areas will blister and peel as cellulitis evolves Dressing procedure/placement/frequency: Foam dressing to protect, absorb draiange and promote healing.    Left upper calf with black hard growth, appearance is not consistent with a wound. Pt has " had a few months", he states. RECOMMEND FOLLOW-UP WITH DERMATOLOGY after discharge to obtain a biopsy of this site. Will not plan to follow further unless re-consulted.  [address], RN, MSN, [employer]  [phone]

## 2012-03-04 NOTE — Evaluation (Signed)
Occupational Therapy Evaluation Patient Details Name: Jason [name] MRN: [id number] DOB: 07/04/1919 Today's Date: 03/04/2012 Time: 8657-8469 OT Time Calculation (min): 33 min  OT Assessment / Plan / Recommendation Clinical Impression  Pt presents to OT s/p admission with cellulitis in LLE.  Pt presents with decreased I due to pain. Pt will benefit from skilled OT to increase I with ADL activity and return to PLOF    OT Assessment  Patient needs continued OT Services    Follow Up Recommendations  Home health OT;Other (comment);Supervision/Assistance - 24 hour (family has 24/7 A.)       Equipment Recommendations  None recommended by OT       Frequency  Min 3X/week    Precautions / Restrictions Precautions Precautions: Fall       ADL  Grooming: Simulated;Set up Where Assessed - Grooming: Supported sitting Upper Body Bathing: Simulated;Minimal assistance Where Assessed - Upper Body Bathing: Supported sitting Lower Body Bathing: Simulated;+2 Total assistance Lower Body Bathing: Patient Percentage: 40% Where Assessed - Lower Body Bathing: Supported sit to stand Upper Body Dressing: Simulated;Set up Where Assessed - Upper Body Dressing: Supported sitting Lower Body Dressing: Simulated;+2 Total assistance Lower Body Dressing: Patient Percentage: 40% Where Assessed - Lower Body Dressing: Supported sit to stand Toilet Transfer: Simulated;+2 Total assistance;Other (comment) (bed to chair) Toilet Transfer: Patient Percentage: 40% Toilet Transfer Method: Sit to stand Toileting - Clothing Manipulation and Hygiene: Simulated;+2 Total assistance Toileting - Clothing Manipulation and Hygiene: Patient Percentage: 40% Where Assessed - Toileting Clothing Manipulation and Hygiene: Standing ADL Comments: Pain is very limiting to pt.  Feel pt will improve significantly as pain improves    OT Diagnosis: Generalized weakness;Acute pain  OT Problem List: Decreased strength;Pain OT Treatment  Interventions: Self-care/ADL training;Patient/family education;DME and/or AE instruction   OT Goals Acute Rehab OT Goals OT Goal Formulation: With patient Time For Goal Achievement: 03/18/12 ADL Goals Pt Will Perform Grooming: with supervision;Standing at sink ADL Goal: Grooming - Progress: Goal set today Pt Will Perform Lower Body Dressing: with min assist;Sit to stand from chair ADL Goal: Lower Body Dressing - Progress: Goal set today Pt Will Transfer to Toilet: with supervision;Comfort height toilet ADL Goal: Toilet Transfer - Progress: Goal set today Pt Will Perform Toileting - Clothing Manipulation: with supervision;Standing ADL Goal: Toileting - Clothing Manipulation - Progress: Goal set today  Visit Information  Last OT Received On: 03/04/12    Subjective Data  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 Prior Function Level of Independence: Independent Comments: [place] [facility] [place] wed[place] of last week. Pt  was very active Communication Communication: [facility]               Extremity/Trunk Assessment Right Upper Extremity Assessment RUE ROM/Strength/Tone: WFL for tasks assessed Left Upper Extremity Assessment LUE ROM/Strength/Tone: WFL for tasks assessed Right Lower Extremity Assessment RLE ROM/Strength/Tone: Deficits RLE ROM/Strength/Tone Deficits: observed functional weakness Left Lower Extremity  Assessment LLE ROM/Strength/Tone: Deficits;Unable to fully assess;Due to pain LLE ROM/Strength/Tone Deficits: red  swollen lower leg, pt able to lift against gravity however unable to take weight due to pain     Mobility Bed Mobility Bed Mobility: Supine to Sit Supine to Sit: 1: +2 Total assist Supine to Sit: Patient Percentage: 40% Sit to Supine: 1: +2 Total assist Sit to Supine: Patient Percentage: 40% Transfers Transfers: Sit to Stand;Stand to Sit Sit to Stand: From bed;1: +2 Total assist Sit to Stand: Patient Percentage: 40% Stand to Sit: To chair/3-in-1              End of Session OT - End of Session Activity Tolerance: Patient limited by pain Patient left: in chair;with call bell/phone within reach;with family/visitor present Nurse Communication: Mobility status  GO     Alba Cory 03/04/2012, 9:58 AM

## 2012-03-04 NOTE — Progress Notes (Signed)
Physical medicine and rehabilitation consult was requested. Chart has been reviewed. At this time as noted per admission coordinator A. [employer] Medicare would not approve for inpatient rehabilitation for cellulitis and is recommended skilled nursing facility versus home health. At this time we'll hold on formal rehabilitation consult and recommend case management proceed with skilled nursing facility versus home health therapies

## 2012-03-05 LAB — BASIC METABOLIC PANEL
CO2: 22 mEq/L (ref 19–32)
Calcium: 8.3 mg/dL — ABNORMAL LOW (ref 8.4–10.5)
Creatinine, Ser: 1.34 mg/dL (ref 0.50–1.35)
GFR calc non Af Amer: 45 mL/min — ABNORMAL LOW (ref >90)
Glucose, Bld: 96 mg/dL (ref 70–99)
Sodium: 133 mEq/L — ABNORMAL LOW (ref 135–145)

## 2012-03-05 LAB — CBC
Hemoglobin: 11.4 g/dL — ABNORMAL LOW (ref 13.0–17.0)
MCH: 32.8 pg (ref 26.0–34.0)
MCHC: 34.5 g/dL (ref 30.0–36.0)
MCV: 94.8 fL (ref 78.0–100.0)
Platelets: 189 10*3/uL (ref 150–400)

## 2012-03-05 MED ORDER — FUROSEMIDE 20 MG PO TABS
20.0000 mg | ORAL_TABLET | Freq: Once | ORAL | Status: AC
  Administered 2012-03-05: 20 mg via ORAL
  Filled 2012-03-05: qty 1

## 2012-03-05 MED ORDER — DOXYCYCLINE HYCLATE 100 MG PO TABS
100.0000 mg | ORAL_TABLET | Freq: Two times a day (BID) | ORAL | Status: DC
  Administered 2012-03-05 – 2012-03-07 (×5): 100 mg via ORAL
  Filled 2012-03-05 (×6): qty 1

## 2012-03-05 MED ORDER — HYDROCODONE-ACETAMINOPHEN 5-325 MG PO TABS
1.0000 | ORAL_TABLET | Freq: Four times a day (QID) | ORAL | Status: DC | PRN
  Administered 2012-03-05: 2 via ORAL
  Administered 2012-03-06: 1 via ORAL
  Administered 2012-03-06: 2 via ORAL
  Administered 2012-03-06: 1 via ORAL
  Administered 2012-03-06 – 2012-03-07 (×2): 2 via ORAL
  Filled 2012-03-05 (×3): qty 2
  Filled 2012-03-05 (×2): qty 1
  Filled 2012-03-05: qty 2

## 2012-03-05 MED ORDER — CEPHALEXIN 500 MG PO CAPS
500.0000 mg | ORAL_CAPSULE | Freq: Four times a day (QID) | ORAL | Status: DC
  Administered 2012-03-05 – 2012-03-07 (×9): 500 mg via ORAL
  Filled 2012-03-05 (×12): qty 1

## 2012-03-05 NOTE — Progress Notes (Signed)
[facility] PROGRESS NOTE  Jason [name]:[id number] DOB: 03-16-1919 DOA: 03/02/2012 PCP: Lynnea Ferrier TOM, MD  Assessment/Plan: Cellulitis of the left leg Transition vancomycin and Levaquin to cephalexin and doxycycline.  Elevated left lower extremity while in bed or sitting.  Left lower extremity venous Doppler on 03/03/2012 showed no DVT or SVT.  Continue physical and occupational therapy.  Appreciate wound care consultation on 03/07/2012, wound care recommended foam dressing to protect, absorb drainage and promote healing.  Her left upper calf black hard growth may need a dermatology evaluation as outpatient.  Mild dehydration Resolved with IV hydration.  Chronic kidney disease, with mild acute renal failure Renal function at baseline.  Improved with mild hydration.  Left lower extremity edema Likely due to cellulitis.  Minimize IV fluids.  Elevate legs as possible.  Will give the patient one dose of Lasix 20 mg, to help remove extra fluid.  History of paroxysmal A. Fib Rate controlled.  Not on any rate limiting agents, was amiodarone in the past, he is no longer on this medication based on cardiology's last note.  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 He also had an IVC filter placed in September of this year when he had to be taken off of his anticoagulation.   History of coronary artery disease Stable.  Leukocytosis Improved, likely due to cellulitis.  Hyponatremia Improved, continue to monitor.  Generalized weakness Physical therapy recommending SNF.  Had a discussion with the patient today who does not want to go to SNF, arrange for home health PT/OT/RN/Aide/social worker at the time of discharge.  Hematuria Patient reports that he had a Foley catheter on admission and had a traumatic accidental removal.  No nursing documentation in the emergency department except patient needing  condom catheter, hematuria resolved at this time.  Hematuria also complicated by chronic anticoagulation.  DVT prophylaxis Rivaroxaban.  Code Status: Full code.  Family Communication: Discussed with patient.  Son also present at bedside.  Disposition Plan: Plan for discharge on 03/06/2012 or 03/07/2012.  Consultants:  Physical therapy  Wound Care  Procedures: Left lower extremity venous Doppler on 03/03/2012 showed no DVT or SVT.  Antibiotics:  Vancomycin 03/02/2012 >> 03/05/2012  Levofloxacin 03/02/2012 >> 03/05/2012  Cephalexin 03/05/2012 >>  Doxycycline 03/05/2012 >>  HPI/Subjective: Still having some intermittent shooting pains in his left leg.  No other specific complaints.  Objective: Filed Vitals:   03/04/12 0559 03/04/12 1400 03/04/12 2230 03/05/12 0511  BP: 139/65 118/71 147/67 166/83  Pulse: 76 77 74 77  Temp: 99 F (37.2 C) 98.3 F (36.8 C) 99.6 F (37.6 C) 98.2 F (36.8 C)  TempSrc: Oral Oral Oral Oral  Resp: 18 18 18 18   Height:      Weight:      SpO2: 94% 91% 96% 93%    Intake/Output Summary (Last 24 hours) at 03/05/12 1013 Last data filed at 03/05/12 1000  Gross per 24 hour  Intake    240 ml  Output   1175 ml  Net   -935 ml   Filed Weights   03/02/12 1500  Weight: 83 kg (182 lb 15.7 oz)    Exam: Physical Exam: General: Awake, Oriented, No acute distress. HEENT: EOMI. Neck: Supple CV: S1 and S2 Lungs: Clear to ascultation bilaterally Abdomen: Soft, Nontender, Nondistended, +bowel sounds. Ext: Good pulses. 2+ LLE edema, with erythema all the way up to the knee, leg appears more bruised but not as  swollen or red as yesterday.  Leg continues to improve.  Data Reviewed: Basic Metabolic Panel:  Lab 03/05/12 1610 03/03/12 0446 03/02/12 1353  NA 133* 135 133*  K 3.5 3.6 3.9  CL 103 103 98  CO2 22 22 24   GLUCOSE 96 93 134*  BUN 17 24* 25*  CREATININE 1.34 1.47* 1.63*  CALCIUM 8.3* 8.3* 9.0  MG -- -- --  PHOS -- -- --   Liver  Function Tests:  Lab 03/03/12 0446  AST 15  ALT 12  ALKPHOS 85  BILITOT 1.0  PROT 6.3  ALBUMIN 2.6*   No results found for this basename: LIPASE:5,AMYLASE:5 in the last 168 hours No results found for this basename: AMMONIA:5 in the last 168 hours CBC:  Lab 03/05/12 0433 03/03/12 0446 03/02/12 1353  WBC 9.2 11.2* 14.2*  NEUTROABS -- -- 10.7*  HGB 11.4* 12.2* 14.1  HCT 33.0* 36.1* 41.2  MCV 94.8 94.8 96.7  PLT 189 177 177   Cardiac Enzymes: No results found for this basename: CKTOTAL:5,CKMB:5,CKMBINDEX:5,TROPONINI:5 in the last 168 hours BNP (last 3 results) No results found for this basename: PROBNP:3 in the last 8760 hours CBG: No results found for this basename: GLUCAP:5 in the last 168 hours  No results found for this or any previous visit (from the past 240 hour(s)).   Studies: No results found.  Scheduled Meds:    . cephALEXin  500 mg Oral Q6H  . docusate sodium  100 mg Oral QHS  . doxycycline  100 mg Oral Q12H  . DULoxetine  30 mg Oral QHS  . gabapentin  600 mg Oral BID  . latanoprost  1 drop Both Eyes QHS  . pantoprazole  40 mg Oral BID  . polyethylene glycol  17 g Oral QHS  . Rivaroxaban  15 mg Oral Q supper  . Tamsulosin HCl  0.4 mg Oral Daily   Continuous Infusions:    Principal Problem:  *Cellulitis of left leg Active Problems:  HYPERTENSION, BENIGN  CORONARY ARTERY DISEASE  CKD (chronic kidney disease), stage III  PAF (paroxysmal atrial fibrillation)  History of DVT (deep vein thrombosis)  Dehydration    Time spent: 25 mins    Millan Legan A  [facility] Pager [phone]. If 8PM-8AM, please contact night-coverage at [email or web address], password [facility] 03/05/2012, 10:13 AM  LOS: 3 days

## 2012-03-05 NOTE — Care Management Note (Signed)
    Page 1 of 2   03/05/2012     12:19:12 PM   CARE MANAGEMENT NOTE 03/05/2012  Patient:  Jason [name], Jason [name]   Account Number:  [id number]  Date Initiated:  03/05/2012  Documentation initiated by:  Lorenda Ishihara  Subjective/Objective Assessment:   78 yo male admitted with cellulitis of LE. PTA lived at home     Action/Plan:   Home vs SNF depending on level of care family can provide.   Anticipated DC Date:  03/07/2012   Anticipated DC Plan:  HOME W HOME HEALTH SERVICES  In-house referral  Clinical Social Worker      DC Planning Services  CM consult      Choice offered to / List presented to:          [facility] arranged  HH-1 RN  HH-2 PT  HH-3 OT  HH-4 NURSE'S AIDE  HH-6 SOCIAL WORKER      HH agency  [facility]   Status of service:  Completed, signed off Medicare Important Message given?   (If response is "NO", the following Medicare IM given date fields will be blank) Date Medicare IM given:   Date Additional Medicare IM given:    Discharge Disposition:  HOME W HOME HEALTH SERVICES  Per UR Regulation:  Reviewed for med. necessity/level of care/duration of stay  If discussed at Long Length of Stay Meetings, dates discussed:    Comments:  Cherrie Distance, RN Case Manager Signed CASE MANAGEMENT Progress Notes 03/04/2012 4:08 PM Talked to patient/ daughter Liborio Nixon about DCP; patient/ daughter requested [facility] for [facility] services; Geraldo Pitter RN with [facility] called for arrangements - Karlene Einstein is to stop by the patient's room and talk to the daughter about the billing process through [facility] tomorrow; Attending MD, please order HHRN/ PT/ OT/ nurses aide and SW at discharge. B Chandler RN,BSN,MHA.  Mathews Argyle, LCSW Social Worker Signed Social Services Progress Notes 03/04/2012 3:34 PM Daughter informed CSW that patient has 24/7 caregivers at home through [facility].

## 2012-03-05 NOTE — Progress Notes (Signed)
Occupational Therapy Treatment Patient Details Name: MARCELIS WISSNER MRN: [id number] DOB: [birth year] Today's Date: 03/05/2012 Time: 1478-2956 OT Time Calculation (min): 26 min  OT Assessment / Plan / Recommendation Comments on Treatment Session      Follow Up Recommendations  SNF    Barriers to Discharge       Equipment Recommendations  None recommended by OT    Recommendations for Other Services    Frequency Min 2X/week   Plan      Precautions / Restrictions Precautions Precautions: Fall Precaution Comments: Pt VERY tender on back and medial side of ankle.  Restrictions Weight Bearing Restrictions: No   Pertinent Vitals/Pain 5/10.  Asked RN for morphine shot prior to getting OOB    ADL  Toilet Transfer: Simulated;+2 Total assistance;Other (comment) Toilet Transfer: Patient Percentage: 60% Toilet Transfer Method: Stand pivot (bed to chair) Toileting - Clothing Manipulation and Hygiene: Performed;+2 Total assistance Toileting - Clothing Manipulation and Hygiene: Patient Percentage: 50% Where Assessed - Toileting Clothing Manipulation and Hygiene: Sit to stand from 3-in-1 or toilet Transfers/Ambulation Related to ADLs: SPT to chair:  premedicated right before.  Son present ADL Comments: Pt's catheter came off.  Performed hygiene at EOB.  Son states his mother has Alzheimers and has 24/7--don't feel one person could safely assist pt at this time.    OT Diagnosis:    OT Problem List:   OT Treatment Interventions:     OT Goals ADL Goals Pt Will Transfer to Toilet: with supervision;Comfort height toilet ADL Goal: Toilet Transfer - Progress: Progressing toward goals Pt Will Perform Toileting - Clothing Manipulation: with supervision;Standing (and hygiene) ADL Goal: Toileting - Clothing Manipulation - Progress: Other (comment) (modified)  Visit Information  Last OT Received On: 03/05/12 Assistance Needed: +2 PT/OT Co-Evaluation/Treatment: Yes    Subjective Data       Prior Functioning       Cognition  Overall Cognitive Status: Appears within functional limits for tasks assessed/performed Arousal/Alertness: Awake/alert Orientation Level: Appears intact for tasks assessed Behavior During Session: [facility] for tasks performed    Mobility  Shoulder Instructions Bed Mobility Supine to Sit: 1: +2 Total assist Supine to Sit: Patient Percentage: 40% Details for Bed Mobility Assistance: assist for LLE.  Pt maintained sitting eob with supervision Transfers Sit to Stand: From bed;1: +2 Total assist Sit to Stand: Patient Percentage: 60% Details for Transfer Assistance: cues for hand placement:  pt could not release grip on walker, even with tactile cues       Exercises      Balance     End of Session OT - End of Session Activity Tolerance: Patient tolerated treatment well Patient left: in chair;with call bell/phone within reach;with family/visitor present Nurse Communication: Mobility status  GO     Amarri Michaelson 03/05/2012, 10:15 AM Marica Otter, OTR/L [phone] 03/05/2012

## 2012-03-05 NOTE — Progress Notes (Signed)
Physical Therapy Treatment Patient Details Name: Jason [name] MRN: [id number] DOB: 02-01-1920 Today's Date: 03/05/2012 Time: 4098-1191 PT Time Calculation (min): 29 min  PT Assessment / Plan / Recommendation Comments on Treatment Session  Pt. did tolerate more activity but had IV pain meds. Per son, Pt. wants to DC to home but pt will need to be able to take cre of himself. Has caregivers for pt's wife, not for pt. Recommend SNF until pt can mange.    Follow Up Recommendations  SNF     Does the patient have the potential to tolerate intense rehabilitation     Barriers to Discharge        Equipment Recommendations  None recommended by PT    Recommendations for Other Services    Frequency Min 3X/week   Plan Discharge plan remains appropriate;Frequency remains appropriate    Precautions / Restrictions Precautions Precautions: Fall Precaution Comments:  VERY tender on back and medial side of ankle on LLE Restrictions Weight Bearing Restrictions: No   Pertinent Vitals/Pain 6 LLeg  Iv meds given.    Mobility  Bed Mobility Supine to Sit: 1: +2 Total assist Supine to Sit: Patient Percentage: 40% Details for Bed Mobility Assistance: assist for LLE.  Pt maintained sitting eob with supervision Transfers Sit to Stand: From bed;1: +2 Total assist Sit to Stand: Patient Percentage: 60% Stand to Sit: 1: +2 Total assist;To chair/3-in-1 Stand to Sit: Patient Percentage: 60% Stand Pivot Transfers: 1: +2 Total assist Stand Pivot Transfers: Patient Percentage: 60% Details for Transfer Assistance: pt. was ablle to place a little weight through L heel and for pivoting on R foot to turn and back up to recliner, Pt had IV pain med. Ambulation/Gait Ambulation/Gait Assistance: Not tested (comment)    Exercises     PT Diagnosis:    PT Problem List:   PT Treatment Interventions:     PT Goals Acute Rehab PT Goals Pt will go Supine/Side to Sit: with supervision PT Goal: Supine/Side to Sit  - Progress: Progressing toward goal Pt will go Sit to Stand: with supervision PT Goal: Sit to Stand - Progress: Progressing toward goal Pt will go Stand to Sit: with supervision Pt will Ambulate: 16 - 50 feet;with supervision;with least restrictive assistive device PT Goal: Ambulate - Progress: Progressing toward goal  Visit Information  Last PT Received On: 03/05/12 Assistance Needed: +2 PT/OT Co-Evaluation/Treatment: Yes    Subjective Data  Subjective: My leg hurts,    Cognition  Overall Cognitive Status: Appears within functional limits for tasks assessed/performed Arousal/Alertness: Awake/alert Orientation Level: Appears intact for tasks assessed Behavior During Session: Jason [name] for tasks performed    Balance     End of Session     GP     Jason [name] 03/05/2012, 10:54 AM [phone]

## 2012-03-06 DIAGNOSIS — K5900 Constipation, unspecified: Secondary | ICD-10-CM

## 2012-03-06 LAB — BASIC METABOLIC PANEL
BUN: 19 mg/dL (ref 6–23)
Calcium: 8.3 mg/dL — ABNORMAL LOW (ref 8.4–10.5)
Creatinine, Ser: 1.4 mg/dL — ABNORMAL HIGH (ref 0.50–1.35)
GFR calc Af Amer: 49 mL/min — ABNORMAL LOW (ref >90)
GFR calc non Af Amer: 43 mL/min — ABNORMAL LOW (ref >90)
Glucose, Bld: 104 mg/dL — ABNORMAL HIGH (ref 70–99)
Potassium: 3.8 mEq/L (ref 3.5–5.1)

## 2012-03-06 LAB — MAGNESIUM: Magnesium: 2 mg/dL (ref 1.5–2.5)

## 2012-03-06 NOTE — Progress Notes (Signed)
Spoke to [employer] worker to make aware per Dr. Betti Cruz that patient plan for SNF tomorrow. MD also aware patient no BM since Saturday, states will write for enema, patient and family request to do that this afternoon after lunch.

## 2012-03-06 NOTE — Progress Notes (Addendum)
[facility] PROGRESS NOTE  RIGBY LEONHARDT ZOX:[id number] DOB: 03/22/1919 DOA: 03/02/2012 PCP: Lynnea Ferrier TOM, MD  Assessment/Plan: Cellulitis of the left leg Transitioned vancomycin and Levaquin to cephalexin and doxycycline.  Elevated left lower extremity while in bed or sitting.  Left lower extremity venous Doppler on 03/03/2012 showed no DVT or SVT.  Continue physical and occupational therapy.  Appreciate wound care consultation on 03/07/2012, wound care recommended foam dressing to protect, absorb drainage and promote healing.  For left upper calf black hard growth may need a dermatology evaluation as outpatient.  Mild dehydration Resolved with IV hydration.  Chronic kidney disease, with mild acute renal failure Renal function at baseline.  Improved with mild hydration.  Left lower extremity edema Likely due to cellulitis.  Minimize IV fluids.  Elevate legs as possible.  Patient given one dose of furosemide 20 mg on 03/05/2012.   History of paroxysmal A. Fib Rate controlled.  Not on any rate limiting agents, was amiodarone in the past, he is no longer on this medication based on cardiology's last note.  Anticoagulated on Rivaroxaban.  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 He also had an IVC filter placed in September of this year when he had to be taken off of his anticoagulation.   History of coronary artery disease Stable.  Leukocytosis Improved, likely due to cellulitis.  Hyponatremia Improved, continue to monitor.  Generalized weakness Physical therapy recommending SNF.  Had a discussion with the patient today who at this time is agreeable to SNF.  Hematuria Patient reports that he had a Foley catheter on admission and had a traumatic accidental removal.  No nursing documentation in the emergency department except patient needing condom catheter, hematuria resolved at this time.   Hematuria also complicated by chronic anticoagulation.  Constipation Continue bowel regimen.  Will give an enema today.  DVT prophylaxis Rivaroxaban.  Code Status: Full code.  Family Communication: Discussed with patient.  Son also present at bedside.  Disposition Plan: Plan for discharge 03/07/2012 with SNF.  Consultants:  Physical therapy  Wound Care  Procedures: Left lower extremity venous Doppler on 03/03/2012 showed no DVT or SVT.  Antibiotics:  Vancomycin 03/02/2012 >> 03/05/2012  Levofloxacin 03/02/2012 >> 03/05/2012  Cephalexin 03/05/2012 >>  Doxycycline 03/05/2012 >>  HPI/Subjective: Patient noted that he was really weak and in trying to go to the bathroom and required 3 people to assist him.  Objective: Filed Vitals:   03/05/12 1339 03/05/12 1752 03/05/12 2154 03/06/12 0559  BP: 133/64 157/76 126/65 134/72  Pulse: 80 75 66 75  Temp: 100.4 F (38 C) 98.6 F (37 C) 98.4 F (36.9 C) 98.9 F (37.2 C)  TempSrc: Oral Oral Oral Oral  Resp: 20 18 18 18   Height:      Weight:      SpO2: 91% 92% 94% 93%    Intake/Output Summary (Last 24 hours) at 03/06/12 1131 Last data filed at 03/06/12 1000  Gross per 24 hour  Intake    480 ml  Output   1025 ml  Net   -545 ml   Filed Weights   03/02/12 1500  Weight: 83 kg (182 lb 15.7 oz)    Exam: Physical Exam: General: Awake, Oriented, No acute distress. HEENT: EOMI. Neck: Supple CV: S1 and S2 Lungs: Clear to ascultation bilaterally Abdomen: Soft, Nontender, Nondistended, +bowel sounds. Ext: Good pulses. 2+ LLE edema, with erythema all the way up to the knee.  Leg continues to improve.  Data Reviewed: Basic Metabolic Panel:  Lab 03/06/12 1610 03/05/12 0433 03/03/12 0446 03/02/12 1353  NA 134* 133* 135 133*  K 3.8 3.5 3.6 3.9  CL 102 103 103 98  CO2 23 22 22 24   GLUCOSE 104* 96 93 134*  BUN 19 17 24* 25*  CREATININE 1.40* 1.34 1.47* 1.63*  CALCIUM 8.3* 8.3* 8.3* 9.0  MG 2.0 -- -- --  PHOS -- --  -- --   Liver Function Tests:  Lab 03/03/12 0446  AST 15  ALT 12  ALKPHOS 85  BILITOT 1.0  PROT 6.3  ALBUMIN 2.6*   No results found for this basename: LIPASE:5,AMYLASE:5 in the last 168 hours No results found for this basename: AMMONIA:5 in the last 168 hours CBC:  Lab 03/05/12 0433 03/03/12 0446 03/02/12 1353  WBC 9.2 11.2* 14.2*  NEUTROABS -- -- 10.7*  HGB 11.4* 12.2* 14.1  HCT 33.0* 36.1* 41.2  MCV 94.8 94.8 96.7  PLT 189 177 177   Cardiac Enzymes: No results found for this basename: CKTOTAL:5,CKMB:5,CKMBINDEX:5,TROPONINI:5 in the last 168 hours BNP (last 3 results) No results found for this basename: PROBNP:3 in the last 8760 hours CBG: No results found for this basename: GLUCAP:5 in the last 168 hours  No results found for this or any previous visit (from the past 240 hour(s)).   Studies: No results found.  Scheduled Meds:    . cephALEXin  500 mg Oral Q6H  . docusate sodium  100 mg Oral QHS  . doxycycline  100 mg Oral Q12H  . DULoxetine  30 mg Oral QHS  . gabapentin  600 mg Oral BID  . latanoprost  1 drop Both Eyes QHS  . pantoprazole  40 mg Oral BID  . polyethylene glycol  17 g Oral QHS  . Rivaroxaban  15 mg Oral Q supper  . Tamsulosin HCl  0.4 mg Oral Daily   Continuous Infusions:    Principal Problem:  *Cellulitis of left leg Active Problems:  HYPERTENSION, BENIGN  CORONARY ARTERY DISEASE  CKD (chronic kidney disease), stage III  PAF (paroxysmal atrial fibrillation)  History of DVT (deep vein thrombosis)  Dehydration    Time spent: 25 mins    Junie Engram A  [facility] Pager [phone]. If 8PM-8AM, please contact night-coverage at [email or web address], password [facility] 03/06/2012, 11:31 AM  LOS: 4 days

## 2012-03-06 NOTE — Progress Notes (Signed)
Report given to oncoming RN informed pt. To have enema today per MD but no orders written at this time

## 2012-03-07 LAB — BASIC METABOLIC PANEL
BUN: 21 mg/dL (ref 6–23)
Calcium: 8.5 mg/dL (ref 8.4–10.5)
Creatinine, Ser: 1.4 mg/dL — ABNORMAL HIGH (ref 0.50–1.35)
GFR calc Af Amer: 49 mL/min — ABNORMAL LOW (ref >90)
GFR calc non Af Amer: 43 mL/min — ABNORMAL LOW (ref >90)
Glucose, Bld: 96 mg/dL (ref 70–99)
Potassium: 3.6 mEq/L (ref 3.5–5.1)

## 2012-03-07 LAB — CBC
Hemoglobin: 11 g/dL — ABNORMAL LOW (ref 13.0–17.0)
MCH: 32.6 pg (ref 26.0–34.0)
MCHC: 33.8 g/dL (ref 30.0–36.0)
Platelets: 208 10*3/uL (ref 150–400)
RDW: 14.9 % (ref 11.5–15.5)

## 2012-03-07 MED ORDER — CEPHALEXIN 500 MG PO CAPS: 500.0000 mg | ORAL_CAPSULE | Freq: Four times a day (QID) | ORAL | Status: AC

## 2012-03-07 MED ORDER — DOXYCYCLINE HYCLATE 100 MG PO TABS: 100.0000 mg | ORAL_TABLET | Freq: Two times a day (BID) | ORAL | Status: DC

## 2012-03-07 NOTE — Discharge Summary (Signed)
Physician Discharge Summary  Jason [name] NWG:[id number] DOB: March 06, 1920 DOA: 03/02/2012  PCP: Leo Grosser, MD  Admit date: 03/02/2012 Discharge date: 03/07/2012  Time spent: 35 minutes  Recommendations for Outpatient Follow-up:  Please followup with [facility] TOM, MD (PCP) in 1 week for wound check.  For left upper calf black hard growth may need a dermatology evaluation as outpatient, per Dr. Tanya Nones.  Please elevate your leg while laying down or sitting.  If the infection of your leg worsens please come back to the ER or followup with [facility] TOM, MD (PCP).  Discharge Diagnoses:  Principal Problem:  *Cellulitis of left leg Active Problems:  HYPERTENSION, BENIGN  CORONARY ARTERY DISEASE  CKD (chronic kidney disease), stage III  PAF (paroxysmal atrial fibrillation)  History of DVT (deep vein thrombosis)  Dehydration   Discharge Condition: Stable  Diet recommendation: Heart healthy diet  Filed Weights   03/02/12 1500  Weight: 83 kg (182 lb 15.7 oz)    History of present illness:  Jason [name] is a 78 y.o. male with a past medical history of coronary artery disease, history of DVTs, paroxysmal atrial fibrillation, BPH, who presented with left leg pain on 03/02/2012.  Hospital Course:  Cellulitis of the left leg  Transitioned vancomycin and Levaquin to cephalexin and doxycycline. Elevated left lower extremity while in bed or sitting. Left lower extremity venous Doppler on 03/03/2012 showed no DVT or SVT. Continue physical and occupational therapy. Appreciate wound care consultation on 03/07/2012, wound care recommended foam dressing to protect, absorb drainage and promote healing. For left upper calf black hard growth may need a dermatology evaluation as outpatient. To followup with PCP for a wound check in 1 week, may need a longer course of antibiotics.  Mild dehydration  Resolved with IV hydration.   Chronic kidney disease, with mild acute renal  failure  Renal function at baseline, stable. Improved with mild hydration.   Left lower extremity edema  Likely due to cellulitis. Minimize IV fluids. Elevate legs as possible. Patient given one dose of furosemide 20 mg on 03/05/2012.   History of paroxysmal A. Fib  Rate controlled. Not on any rate limiting agents, was amiodarone in the past, he is no longer on this medication based on cardiology's last note. Anticoagulated on Rivaroxaban.   History of DVTs on chronic anticoagulation  He has interruptions to anticoagulation however, now he is back on Xarelto. He also had an IVC filter placed in September of this year when he had to be taken off of his anticoagulation.   History of coronary artery disease  Stable.   Leukocytosis  Improved, likely due to cellulitis.   Hyponatremia  Improved, continue to monitor.   Generalized weakness  Physical therapy recommending SNF. Had a discussion with the patient multiple times during the hospital stay who wavered between going home versus SNF. Prior to discharge patient indicated that he wanted to go home prior to discharge. Patient to be discharged to SNF or home health depending on his wishes.  Hematuria  Resolved. Patient reports that he had a Foley catheter on admission and had a traumatic accidental removal. No nursing documentation in the emergency department except patient needing condom catheter, hematuria resolved at this time. Hematuria also complicated by chronic anticoagulation.   Constipation  Continue bowel regimen. Resolved with enema on 03/06/2012.   DVT prophylaxis  Rivaroxaban.   Code Status: Full code.   Consultants:  Physical therapy  Wound Care  Procedures:  Left lower extremity venous Doppler on  03/03/2012 showed no DVT or SVT.   Antibiotics:  Vancomycin 03/02/2012 >> 03/05/2012  Levofloxacin 03/02/2012 >> 03/05/2012  Cephalexin 03/05/2012 >> Till 03/15/2012 Doxycycline 03/05/2012 >> Till 03/15/2012  Discharge  Exam: Filed Vitals:   03/06/12 0559 03/06/12 1500 03/06/12 2152 03/07/12 0351  BP: 134/72 117/69 119/66 154/76  Pulse: 75 77 65 63  Temp: 98.9 F (37.2 C) 97.6 F (36.4 C) 98.5 F (36.9 C) 97.9 F (36.6 C)  TempSrc: Oral  Oral Oral  Resp: 18 16 18 16   Height:      Weight:      SpO2: 93% 96% 93% 93%   Discharge Instructions  Discharge Orders    Future Appointments: Provider: Department: Dept Phone: Center:   03/19/2012 9:15 AM [facility] Main [facility] [place]) [phone] [email or web address]     Future Orders Please Complete By Expires   Diet - low sodium heart healthy      Increase activity slowly      Discharge instructions      Comments:   Please followup with [facility] TOM, MD (PCP) in 1 week for wound check.  Please elevate your leg while laying down or sitting.  If the infection of your leg worsens please come back to the ER or followup with [facility] TOM, MD (PCP).       Medication List     As of 03/07/2012 12:23 PM    TAKE these medications         ALPRAZolam 0.25 MG tablet   Commonly known as: XANAX   Take 0.25 mg by mouth at bedtime as needed. anxiety      cephALEXin 500 MG capsule   Commonly known as: KEFLEX   Take 1 capsule (500 mg total) by mouth every 6 (six) hours. Till 03/16/2011      cholecalciferol 1000 UNITS tablet   Commonly known as: VITAMIN D   Take 1,000 Units by mouth daily.      docusate sodium 100 MG capsule   Commonly known as: COLACE   Take 100 mg by mouth at bedtime.      doxycycline 100 MG tablet   Commonly known as: VIBRA-TABS   Take 1 tablet (100 mg total) by mouth every 12 (twelve) hours.      DULoxetine 30 MG capsule   Commonly known as: CYMBALTA   Take 30 mg by mouth at bedtime.      FLOMAX 0.4 MG Caps   Generic drug: Tamsulosin HCl   Take 0.4 mg by mouth daily.      gabapentin 300 MG capsule   Commonly known as: NEURONTIN   Take 600 mg by mouth 2 (two) times daily.       HYDROcodone-acetaminophen 5-325 MG per tablet   Commonly known as: NORCO/VICODIN   Take 1 tablet by mouth every 6 (six) hours as needed. Pain        latanoprost 0.005 % ophthalmic solution   Commonly known as: XALATAN   Place 1 drop into both eyes at bedtime.      meclizine 12.5 MG tablet   Commonly known as: ANTIVERT   Take 12.5 mg by mouth 3 (three) times daily as needed. For dizziness.      MIRALAX powder   Generic drug: polyethylene glycol powder   Take 17 g by mouth at bedtime. Constipation        nitroGLYCERIN 0.4 MG SL tablet   Commonly known as: NITROSTAT   Place 0.4 mg under the tongue every 5 (five) minutes  as needed. Chest pain      omeprazole 20 MG capsule   Commonly known as: PRILOSEC   Take 20 mg by mouth 2 (two) times daily before a meal.      promethazine 25 MG tablet   Commonly known as: PHENERGAN   Take 25 mg by mouth every 6 (six) hours as needed. nausea      Rivaroxaban 15 MG Tabs tablet   Commonly known as: XARELTO   Take 1 tablet (15 mg total) by mouth daily with supper.      zolpidem 5 MG tablet   Commonly known as: AMBIEN   Take 5 mg by mouth at bedtime as needed. For insomnia.          The results of significant diagnostics from this hospitalization (including imaging, microbiology, ancillary and laboratory) are listed below for reference.    Significant Diagnostic Studies: No results found.  Microbiology: No results found for this or any previous visit (from the past 240 hour(s)).   Labs: Basic Metabolic Panel:  Lab 03/07/12 2130 03/06/12 0420 03/05/12 0433 03/03/12 0446 03/02/12 1353  NA 136 134* 133* 135 133*  K 3.6 3.8 3.5 3.6 3.9  CL 104 102 103 103 98  CO2 24 23 22 22 24   GLUCOSE 96 104* 96 93 134*  BUN 21 19 17  24* 25*  CREATININE 1.40* 1.40* 1.34 1.47* 1.63*  CALCIUM 8.5 8.3* 8.3* 8.3* 9.0  MG -- 2.0 -- -- --  PHOS -- -- -- -- --   Liver Function Tests:  Lab 03/03/12 0446  AST 15  ALT 12  ALKPHOS 85  BILITOT  1.0  PROT 6.3  ALBUMIN 2.6*   No results found for this basename: LIPASE:5,AMYLASE:5 in the last 168 hours No results found for this basename: AMMONIA:5 in the last 168 hours CBC:  Lab 03/07/12 0422 03/05/12 0433 03/03/12 0446 03/02/12 1353  WBC 8.2 9.2 11.2* 14.2*  NEUTROABS -- -- -- 10.7*  HGB 11.0* 11.4* 12.2* 14.1  HCT 32.5* 33.0* 36.1* 41.2  MCV 96.4 94.8 94.8 96.7  PLT 208 189 177 177   Cardiac Enzymes: No results found for this basename: CKTOTAL:5,CKMB:5,CKMBINDEX:5,TROPONINI:5 in the last 168 hours BNP: BNP (last 3 results) No results found for this basename: PROBNP:3 in the last 8760 hours CBG: No results found for this basename: GLUCAP:5 in the last 168 hours  Signed:  Norah Devin A  [facility] 03/07/2012, 12:23 PM

## 2012-03-07 NOTE — Progress Notes (Signed)
Physical Therapy Treatment Patient Details Name: Jason [name] MRN: [id number] DOB: 1919/10/06 Today's Date: 03/07/2012 Time: 4098-1191 PT Time Calculation (min): 29 min  PT Assessment / Plan / Recommendation Comments on Treatment Session  Pt progressing slowly due to L LE pain and c/o eakness.  Pt declines SNF and states he will have plenty of help at home. Pt had difficulty with standing and his gait is very slow.  HIGH FALL RISK.  Pt will need 24/7 assist at home.    Follow Up Recommendations  SNF     Does the patient have the potential to tolerate intense rehabilitation     Barriers to Discharge        Equipment Recommendations  None recommended by PT    Recommendations for Other Services    Frequency Min 3X/week   Plan Discharge plan remains appropriate    Precautions / Restrictions Precautions Precautions: Fall Precaution Comments: L LE edema Restrictions Weight Bearing Restrictions: No Other Position/Activity Restrictions: WBAT   Pertinent Vitals/Pain C/o 6/10 L LE pain C/o "stiffness" R LE    Mobility  Bed Mobility Bed Mobility: Supine to Sit;Sitting - Scoot to Edge of Bed Supine to Sit: 4: Min assist Sitting - Scoot to [place] of Bed: 4: Min assist Details for Bed Mobility Assistance: increased, increased time Transfers Transfers: Sit to Stand;Stand to Sit Sit to Stand: 1: +2 Total assist;From chair/3-in-1;From toilet Sit to Stand: Patient Percentage: 50% Stand to Sit: 1: +2 Total assist Stand to Sit: Patient Percentage: 80% Details for Transfer Assistance: increased difficulty transitioning from sit to stand with MAX c/o B LE pain/weakness and stiffness throughout.  Pt stated, "give me a push" when assisting off toilet. Very slow moving and HIGH FALL RISK. Ambulation/Gait Ambulation/Gait Assistance: 1: +2 Total assist Ambulation/Gait: Patient Percentage: 80% Ambulation Distance (Feet): 32 Feet (12' to BR then another 72' to hallway) Assistive device: Rolling  walker Ambulation/Gait Assistance Details: Very slow, rigid, shuffled gait with co B LE pain L>R. Assisted pt to BR then amb limited distance in hallway due to MAX c/o weakness/fatigue. Gait Pattern: Step-to pattern;Step-through pattern;Trunk flexed;Shuffle Gait velocity: decreased    PT Goals                                                 Progressing slowly    Visit Information  Last PT Received On: 03/07/12 Assistance Needed: +2    Subjective Data  Subjective: My legs hurt Patient Stated Goal: home   Cognition    good   Balance   poor  End of Session PT - End of Session Equipment Utilized During Treatment: Gait belt Activity Tolerance: Patient limited by fatigue Patient left: in chair;with call bell/phone within reach   Felecia Shelling  PTA [facility]  Acute  Rehab Pager     [phone]

## 2012-03-07 NOTE — Progress Notes (Signed)
[facility] PROGRESS NOTE  Jason [name] ZOX:[id number] DOB: 05/27/1919 DOA: 03/02/2012 PCP: Lynnea Ferrier TOM, MD  Assessment/Plan: Cellulitis of the left leg Transitioned vancomycin and Levaquin to cephalexin and doxycycline.  Elevated left lower extremity while in bed or sitting.  Left lower extremity venous Doppler on 03/03/2012 showed no DVT or SVT.  Continue physical and occupational therapy.  Appreciate wound care consultation on 03/07/2012, wound care recommended foam dressing to protect, absorb drainage and promote healing.  For left upper calf black hard growth may need a dermatology evaluation as outpatient.  Mild dehydration Resolved with IV hydration.  Chronic kidney disease, with mild acute renal failure Renal function at baseline, stable.  Improved with mild hydration.  Left lower extremity edema Likely due to cellulitis.  Minimize IV fluids.  Elevate legs as possible.  Patient given one dose of furosemide 20 mg on 03/05/2012.   History of paroxysmal A. Fib Rate controlled.  Not on any rate limiting agents, was amiodarone in the past, he is no longer on this medication based on cardiology's last note.  Anticoagulated on Rivaroxaban.  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 He also had an IVC filter placed in September of this year when he had to be taken off of his anticoagulation.   History of coronary artery disease Stable.  Leukocytosis Improved, likely due to cellulitis.  Hyponatremia Improved, continue to monitor.  Generalized weakness Physical therapy recommending SNF.  Had a discussion with the patient again today, who now wants to go home with home health.   Hematuria Resolved. Patient reports that he had a Foley catheter on admission and had a traumatic accidental removal.  No nursing documentation in the emergency department except patient needing condom catheter,  hematuria resolved at this time.  Hematuria also complicated by chronic anticoagulation.  Constipation Continue bowel regimen.  Resolved with enema on 03/06/2012.   DVT prophylaxis Rivaroxaban.  Code Status: Full code.  Family Communication: Discussed with patient.  No family at bedside.  Disposition Plan: Plan for discharge 03/07/2012 with SNF or home with home health today.  Consultants:  Physical therapy  Wound Care  Procedures: Left lower extremity venous Doppler on 03/03/2012 showed no DVT or SVT.  Antibiotics:  Vancomycin 03/02/2012 >> 03/05/2012  Levofloxacin 03/02/2012 >> 03/05/2012  Cephalexin 03/05/2012 >>  Doxycycline 03/05/2012 >>  HPI/Subjective: Reports leg feels better. Pain improved.  Objective: Filed Vitals:   03/06/12 0559 03/06/12 1500 03/06/12 2152 03/07/12 0351  BP: 134/72 117/69 119/66 154/76  Pulse: 75 77 65 63  Temp: 98.9 F (37.2 C) 97.6 F (36.4 C) 98.5 F (36.9 C) 97.9 F (36.6 C)  TempSrc: Oral  Oral Oral  Resp: 18 16 18 16   Height:      Weight:      SpO2: 93% 96% 93% 93%    Intake/Output Summary (Last 24 hours) at 03/07/12 1144 Last data filed at 03/07/12 0900  Gross per 24 hour  Intake    840 ml  Output   1025 ml  Net   -185 ml   Filed Weights   03/02/12 1500  Weight: 83 kg (182 lb 15.7 oz)    Exam: Physical Exam: General: Awake, Oriented, No acute distress. HEENT: EOMI. Neck: Supple CV: S1 and S2 Lungs: Clear to ascultation bilaterally Abdomen: Soft, Nontender, Nondistended, +bowel sounds. Ext: Good pulses. 2+ LLE edema, with erythema all the way up to the knee.  Leg continues to improve.  Data Reviewed: Basic Metabolic Panel:  Lab 03/07/12 1610 03/06/12 0420 03/05/12 0433 03/03/12 0446 03/02/12 1353  NA 136 134* 133* 135 133*  K 3.6 3.8 3.5 3.6 3.9  CL 104 102 103 103 98  CO2 24 23 22 22 24   GLUCOSE 96 104* 96 93 134*  BUN 21 19 17  24* 25*  CREATININE 1.40* 1.40* 1.34 1.47* 1.63*  CALCIUM 8.5 8.3* 8.3*  8.3* 9.0  MG -- 2.0 -- -- --  PHOS -- -- -- -- --   Liver Function Tests:  Lab 03/03/12 0446  AST 15  ALT 12  ALKPHOS 85  BILITOT 1.0  PROT 6.3  ALBUMIN 2.6*   No results found for this basename: LIPASE:5,AMYLASE:5 in the last 168 hours No results found for this basename: AMMONIA:5 in the last 168 hours CBC:  Lab 03/07/12 0422 03/05/12 0433 03/03/12 0446 03/02/12 1353  WBC 8.2 9.2 11.2* 14.2*  NEUTROABS -- -- -- 10.7*  HGB 11.0* 11.4* 12.2* 14.1  HCT 32.5* 33.0* 36.1* 41.2  MCV 96.4 94.8 94.8 96.7  PLT 208 189 177 177   Cardiac Enzymes: No results found for this basename: CKTOTAL:5,CKMB:5,CKMBINDEX:5,TROPONINI:5 in the last 168 hours BNP (last 3 results) No results found for this basename: PROBNP:3 in the last 8760 hours CBG: No results found for this basename: GLUCAP:5 in the last 168 hours  No results found for this or any previous visit (from the past 240 hour(s)).   Studies: No results found.  Scheduled Meds:    . cephALEXin  500 mg Oral Q6H  . docusate sodium  100 mg Oral QHS  . doxycycline  100 mg Oral Q12H  . DULoxetine  30 mg Oral QHS  . gabapentin  600 mg Oral BID  . latanoprost  1 drop Both Eyes QHS  . pantoprazole  40 mg Oral BID  . polyethylene glycol  17 g Oral QHS  . Rivaroxaban  15 mg Oral Q supper  . Tamsulosin HCl  0.4 mg Oral Daily   Continuous Infusions:    Principal Problem:  *Cellulitis of left leg Active Problems:  HYPERTENSION, BENIGN  CORONARY ARTERY DISEASE  CKD (chronic kidney disease), stage III  PAF (paroxysmal atrial fibrillation)  History of DVT (deep vein thrombosis)  Dehydration    Time spent: 25 mins    Shammara Jarrett A  [facility] Pager [phone]. If 8PM-8AM, please contact night-coverage at [email or web address], password [email or web address] 03/07/2012, 11:44 AM  LOS: 5 days

## 2012-03-19 ENCOUNTER — Other Ambulatory Visit: Payer: Medicare Other

## 2012-07-17 ENCOUNTER — Encounter: Payer: Self-pay | Admitting: Physician Assistant

## 2012-07-17 ENCOUNTER — Ambulatory Visit
Admission: RE | Admit: 2012-07-17 | Discharge: 2012-07-17 | Disposition: A | Discharge status: Home / Self Care | Payer: Medicare Other | Source: Ambulatory Visit | Attending: Physician Assistant | Admitting: Physician Assistant

## 2012-07-17 ENCOUNTER — Ambulatory Visit (INDEPENDENT_AMBULATORY_CARE_PROVIDER_SITE_OTHER): Payer: Medicare Other | Admitting: Physician Assistant

## 2012-07-17 VITALS — BP 140/70 | HR 64 | Temp 98.3°F | Resp 16 | Ht 68.0 in | Wt 187.0 lb

## 2012-07-17 DIAGNOSIS — R109 Unspecified abdominal pain: Secondary | ICD-10-CM

## 2012-07-17 LAB — CBC W/MCH & 3 PART DIFF
HCT: 43.2 % (ref 39.0–52.0)
Lymphocytes Relative: 36 % (ref 12–46)
MCHC: 33.6 g/dL (ref 30.0–36.0)
Neutro Abs: 3.4 10*3/uL (ref 1.7–7.7)
Neutrophils Relative %: 55 % (ref 43–77)
RDW: 14.9 % (ref 11.5–15.5)
WBC mixed population: 0.6 10*3/uL (ref 0.1–1.8)
WBC: 6.2 10*3/uL (ref 4.0–10.5)

## 2012-07-17 LAB — URINALYSIS, ROUTINE W REFLEX MICROSCOPIC
Bilirubin Urine: NEGATIVE
Glucose, UA: NEGATIVE mg/dL
Hgb urine dipstick: NEGATIVE
Ketones, ur: NEGATIVE mg/dL
Specific Gravity, Urine: 1.02 (ref 1.005–1.030)
pH: 6 (ref 5.0–8.0)

## 2012-07-17 NOTE — Progress Notes (Signed)
Patient ID: Jason [name] MRN: [id number], DOB: 1919/05/21, 79 y.o. Date of Encounter: @DATE @  Chief Complaint:  Chief Complaint  Patient presents with  . Abdominal Cramping    diarrhea    HPI: 79 y.o. year old male  presents with his daughter. They report that this started on Sunday 07/14/12. On that afternoon he developed abdominal pain. On Monday 07/15/12, he had diarrhea-5-6 episodes. HAs had no diarrhea since Monday. Has had no vomiting. Some nausea. Has continued to have some mild abdominal pain.  Yesterday had a normal bowel movement.  Has eaten little 'just because doesn't feel hungry." Eating does not cause increased pain.  Has had no fever. No vomiting at all.   Daughter does note that he had intestinal volvulus in 2008.    Past Medical History  Diagnosis Date  . PVD (peripheral vascular disease)   . CAD (coronary artery disease)     a. 12/2005 - PCI to OM  . Hyperlipidemia   . Cerebrovascular accident   . DVT femoral (deep venous thrombosis) with thrombophlebitis   . GERD (gastroesophageal reflux disease)   . Edema   . Anxiety   . Vertigo   . IBS (irritable bowel syndrome)   . Duodenitis   . Gastritis   . Esophageal stricture   . Cellulitis   . Pulmonary embolism   . Prostatic hypertrophy   . Frequent falls   . Subdural hematoma   . PTSD (post-traumatic stress disorder)   . Vertigo   . TIA (transient ischemic attack)     a. 01/2006 and 05/2006.   [name] Hiatal hernia     periferal neuropathy  . Hx of echocardiogram     Echo 9/13:  Mild LVH, EF 60-65%, Gr 1 diast dysfn, mild AI, mild BAE     Home Meds: See attached medication section for current medication list. Any medications entered into computer today will not appear on this note's list. The medications listed below were entered prior to today. Current Outpatient Prescriptions on File Prior to Visit  Medication Sig Dispense Refill  . ALPRAZolam (XANAX) 0.25 MG tablet Take 0.25 mg by mouth at bedtime as  needed. anxiety      . cholecalciferol (VITAMIN D) 1000 UNITS tablet Take 1,000 Units by mouth daily.      [name] docusate sodium (COLACE) 100 MG capsule Take 100 mg by mouth at bedtime.      [name] doxycycline (VIBRA-TABS) 100 MG tablet Take 1 tablet (100 mg total) by mouth every 12 (twelve) hours.  18 tablet  0  . DULoxetine (CYMBALTA) 30 MG capsule Take 30 mg by mouth at bedtime.      . gabapentin (NEURONTIN) 300 MG capsule Take 600 mg by mouth 2 (two) times daily.       [name] HYDROcodone-acetaminophen (NORCO) 5-325 MG per tablet Take 1 tablet by mouth every 6 (six) hours as needed. Pain       . latanoprost (XALATAN) 0.005 % ophthalmic solution Place 1 drop into both eyes at bedtime.      . meclizine (ANTIVERT) 12.5 MG tablet Take 12.5 mg by mouth 3 (three) times daily as needed. For dizziness.      . nitroGLYCERIN (NITROSTAT) 0.4 MG SL tablet Place 0.4 mg under the tongue every 5 (five) minutes as needed. Chest pain      . omeprazole (PRILOSEC) 20 MG capsule Take 20 mg by mouth 2 (two) times daily before a meal.       . polyethylene  glycol powder (MIRALAX) powder Take 17 g by mouth at bedtime. Constipation       . promethazine (PHENERGAN) 25 MG tablet Take 25 mg by mouth every 6 (six) hours as needed. nausea      . Rivaroxaban (XARELTO) 15 MG TABS tablet Take 1 tablet (15 mg total) by mouth daily with supper.  30 tablet  6  . Tamsulosin HCl (FLOMAX) 0.4 MG CAPS Take 0.4 mg by mouth daily.       [name] zolpidem (AMBIEN) 5 MG tablet Take 5 mg by mouth at bedtime as needed. For insomnia.       No current facility-administered medications on file prior to visit.    Allergies:  Allergies  Allergen Reactions  . Amoxicillin-Pot Clavulanate Nausea And Vomiting    daughter states he can tolerate Amoxicillin ok  . Fludrocortisone Acetate Other (See Comments)    unknown  . Scopace (Scopolamine) Other (See Comments)    *patch Unknown reaction    History   Social History  . Marital Status: Married     Spouse Name: N/A    Number of Children: N/A  . Years of Education: N/A   Occupational History  . Not on file.   Social History Main Topics  . Smoking status: Never Smoker   . Smokeless tobacco: Never Used  . Alcohol Use: Yes     Comment: Wine occasional  . Drug Use: No  . Sexually Active: No   Other Topics Concern  . 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 Colon cancer Neg Hx   . Coronary artery disease Brother   . Heart attack Father      Review of Systems:  See HPI for pertinent ROS. All other ROS negative.    Physical Exam: Blood pressure 140/70, pulse 64, temperature 98.3 F (36.8 C), temperature source Oral, resp. rate 16, height 5\' 8"  (1.727 m), weight 187 lb (84.823 kg)., Body mass index is 28.44 kg/(m^2). General:WNWD WM.  Appears in no acute distress. Lungs: Clear bilaterally to auscultation without wheezes, rales, or rhonchi. Breathing is unlabored. Heart: RRR with S1 S2. No murmurs, rubs, or gallops. Abdomen: Nml BS. Abdomen appears slightly distended but pt and daughter state that it doesnot seem much different than his usual baseline. The only area that is tender with palpation is midline, between epigastrium and umbilicus. No other area of tenderness. No mass or organomegaly.   Musculoskeletal:  Strength and tone normal for age. Extremities/Skin: Warm and dry. Neuro: Alert and oriented X 3. Moves all extremities spontaneously. Gait is normal. CNII-XII grossly in tact. Psych:  Responds to questions appropriately with a normal affect.     ASSESSMENT AND PLAN:  79 y.o. year old male with  1. Abdominal pain, unspecified site - COMPLETE METABOLIC PANEL WITH GFR - CBC w/MCH & 3 Part Diff - Urinalysis, Routine w reflex microscopic - DG Abd 2 Views; Future  UA, CBC reviewed here  And are normal. Will send CMET. Will obtain KUB now. May need CT but  will obtain these tests then f/u with him.Will call daughter-[name]-[phone].   [address] [place], [place], [facility] 07/17/2012 4:55 PM

## 2012-07-18 LAB — COMPLETE METABOLIC PANEL WITH GFR
Albumin: 3.7 g/dL (ref 3.5–5.2)
Alkaline Phosphatase: 79 U/L (ref 39–117)
BUN: 20 mg/dL (ref 6–23)
CO2: 28 mEq/L (ref 19–32)
GFR, Est African American: 56 mL/min — ABNORMAL LOW
GFR, Est Non African American: 48 mL/min — ABNORMAL LOW
Glucose, Bld: 84 mg/dL (ref 70–99)
Potassium: 4.7 mEq/L (ref 3.5–5.3)
Total Bilirubin: 0.5 mg/dL (ref 0.3–1.2)

## 2012-07-26 ENCOUNTER — Encounter (HOSPITAL_COMMUNITY): Payer: Self-pay | Admitting: *Deleted

## 2012-07-26 ENCOUNTER — Emergency Department (HOSPITAL_COMMUNITY)
Admission: EM | Admit: 2012-07-26 | Discharge: 2012-07-26 | Disposition: A | Discharge status: Home / Self Care | Payer: Medicare Other | Attending: Emergency Medicine | Admitting: Emergency Medicine

## 2012-07-26 ENCOUNTER — Emergency Department (HOSPITAL_COMMUNITY): Payer: Medicare Other

## 2012-07-26 ENCOUNTER — Ambulatory Visit (INDEPENDENT_AMBULATORY_CARE_PROVIDER_SITE_OTHER): Payer: Medicare Other | Admitting: Family Medicine

## 2012-07-26 ENCOUNTER — Encounter: Payer: Self-pay | Admitting: Family Medicine

## 2012-07-26 VITALS — BP 140/80 | HR 62 | Temp 97.8°F | Resp 14 | Wt 181.0 lb

## 2012-07-26 DIAGNOSIS — Z8659 Personal history of other mental and behavioral disorders: Secondary | ICD-10-CM | POA: Insufficient documentation

## 2012-07-26 DIAGNOSIS — I2510 Atherosclerotic heart disease of native coronary artery without angina pectoris: Secondary | ICD-10-CM | POA: Insufficient documentation

## 2012-07-26 DIAGNOSIS — Z8719 Personal history of other diseases of the digestive system: Secondary | ICD-10-CM | POA: Insufficient documentation

## 2012-07-26 DIAGNOSIS — K219 Gastro-esophageal reflux disease without esophagitis: Secondary | ICD-10-CM | POA: Insufficient documentation

## 2012-07-26 DIAGNOSIS — Z872 Personal history of diseases of the skin and subcutaneous tissue: Secondary | ICD-10-CM | POA: Insufficient documentation

## 2012-07-26 DIAGNOSIS — Z8679 Personal history of other diseases of the circulatory system: Secondary | ICD-10-CM | POA: Insufficient documentation

## 2012-07-26 DIAGNOSIS — K529 Noninfective gastroenteritis and colitis, unspecified: Secondary | ICD-10-CM

## 2012-07-26 DIAGNOSIS — R1033 Periumbilical pain: Secondary | ICD-10-CM | POA: Insufficient documentation

## 2012-07-26 DIAGNOSIS — Z8673 Personal history of transient ischemic attack (TIA), and cerebral infarction without residual deficits: Secondary | ICD-10-CM | POA: Insufficient documentation

## 2012-07-26 DIAGNOSIS — Z86718 Personal history of other venous thrombosis and embolism: Secondary | ICD-10-CM | POA: Insufficient documentation

## 2012-07-26 DIAGNOSIS — Z880 Allergy status to penicillin: Secondary | ICD-10-CM | POA: Insufficient documentation

## 2012-07-26 DIAGNOSIS — K5289 Other specified noninfective gastroenteritis and colitis: Secondary | ICD-10-CM | POA: Insufficient documentation

## 2012-07-26 DIAGNOSIS — R109 Unspecified abdominal pain: Secondary | ICD-10-CM

## 2012-07-26 DIAGNOSIS — Z79899 Other long term (current) drug therapy: Secondary | ICD-10-CM | POA: Insufficient documentation

## 2012-07-26 DIAGNOSIS — F411 Generalized anxiety disorder: Secondary | ICD-10-CM | POA: Insufficient documentation

## 2012-07-26 DIAGNOSIS — Z86711 Personal history of pulmonary embolism: Secondary | ICD-10-CM | POA: Insufficient documentation

## 2012-07-26 LAB — LIPASE, BLOOD: Lipase: 30 U/L (ref 11–59)

## 2012-07-26 LAB — CBC WITH DIFFERENTIAL/PLATELET
Basophils Relative: 1 % (ref 0–1)
Eosinophils Absolute: 0.3 10*3/uL (ref 0.0–0.7)
HCT: 41.4 % (ref 39.0–52.0)
Hemoglobin: 14 g/dL (ref 13.0–17.0)
Lymphs Abs: 2 10*3/uL (ref 0.7–4.0)
MCH: 31.6 pg (ref 26.0–34.0)
MCHC: 33.8 g/dL (ref 30.0–36.0)
MCV: 93.5 fL (ref 78.0–100.0)
Monocytes Absolute: 0.6 10*3/uL (ref 0.1–1.0)
Monocytes Relative: 10 % (ref 3–12)
Neutrophils Relative %: 54 % (ref 43–77)
RBC: 4.43 MIL/uL (ref 4.22–5.81)

## 2012-07-26 LAB — COMPREHENSIVE METABOLIC PANEL
Albumin: 3.1 g/dL — ABNORMAL LOW (ref 3.5–5.2)
Alkaline Phosphatase: 87 U/L (ref 39–117)
BUN: 15 mg/dL (ref 6–23)
Creatinine, Ser: 1.28 mg/dL (ref 0.50–1.35)
GFR calc Af Amer: 55 mL/min — ABNORMAL LOW (ref >90)
Glucose, Bld: 85 mg/dL (ref 70–99)
Potassium: 4.1 mEq/L (ref 3.5–5.1)
Total Bilirubin: 0.4 mg/dL (ref 0.3–1.2)
Total Protein: 7.6 g/dL (ref 6.0–8.3)

## 2012-07-26 LAB — URINALYSIS, ROUTINE W REFLEX MICROSCOPIC
Bilirubin Urine: NEGATIVE
Ketones, ur: NEGATIVE mg/dL
Leukocytes, UA: NEGATIVE
Nitrite: NEGATIVE
Specific Gravity, Urine: 1.021 (ref 1.005–1.030)
Urobilinogen, UA: 0.2 mg/dL (ref 0.0–1.0)

## 2012-07-26 MED ORDER — IOHEXOL 300 MG/ML  SOLN
100.0000 mL | Freq: Once | INTRAMUSCULAR | Status: AC | PRN
  Administered 2012-07-26: 100 mL via INTRAVENOUS

## 2012-07-26 MED ORDER — SODIUM CHLORIDE 0.9 % IV BOLUS (SEPSIS)
500.0000 mL | Freq: Once | INTRAVENOUS | Status: AC
  Administered 2012-07-26: 500 mL via INTRAVENOUS

## 2012-07-26 MED ORDER — METRONIDAZOLE 500 MG PO TABS: 500.0000 mg | ORAL_TABLET | Freq: Three times a day (TID) | ORAL | Status: DC

## 2012-07-26 MED ORDER — IOHEXOL 300 MG/ML  SOLN
50.0000 mL | Freq: Once | INTRAMUSCULAR | Status: AC | PRN
  Administered 2012-07-26: 50 mL via ORAL

## 2012-07-26 MED ORDER — HYDROCODONE-ACETAMINOPHEN 5-325 MG PO TABS: 2.0000 | ORAL_TABLET | ORAL | Status: DC | PRN

## 2012-07-26 NOTE — ED Notes (Signed)
ZOX:[id number]<AV> Expected date:<BR> Expected time:<BR> Means of arrival:<BR> Comments:<BR> 79 y/o abd pain x 3 wks

## 2012-07-26 NOTE — ED Notes (Addendum)
Per ems pt has been seen for abdominal pain at least twice in last 3 weeks for abdominal pain. Went to pcp today, was told to go to ED if pain persisted. Pt was not sent home with any medications. Ultrasound schedule in 1.5 weeks. Family thinks pt should be evaluated before then. Denies vomiting. Positive nausea. Denies bloody stool. Family reports intermittent constipation and diarrhea. In 2008 hx of intestinal volvulus, but resolved on its own.   Upon rn assessment pt reports abdominal pain 5/10.

## 2012-07-26 NOTE — ED Provider Notes (Signed)
History     CSN: [id number]  Arrival date & time 07/26/12  1600   First MD Initiated Contact with Patient 07/26/12 1609      Chief Complaint  Patient presents with  . Abdominal Pain    (Consider location/radiation/quality/duration/timing/severity/associated sxs/prior treatment) HPI Comments: Patient with three week history of generalized abd cramping.  He feels nauseated but has not vomited.  No fevers or chills.  No diarrhea or bloody stool.  He has been seen at the pcp for the same and had lab work that was essentially unremarkable.  Symptoms worsening, was told to come here for additional testing.  No prior abd surgery.  Does report to me that he had a prior abd volvulus but did not require surgery.  This was in 2006.    Patient is a 79 y.o. male presenting with abdominal pain. The history is provided by the patient.  Abdominal Pain Pain location:  Periumbilical Pain quality: cramping   Pain radiates to:  Does not radiate Pain severity:  Moderate Onset quality:  Gradual Duration:  3 weeks Timing:  Intermittent Progression:  Worsening Chronicity:  New Context: not diet changes, not recent illness, not suspicious food intake and not trauma   Relieved by:  Nothing Worsened by:  Nothing tried Ineffective treatments:  None tried Associated symptoms: no chills, no constipation, no fever and no shortness of breath     Past Medical History  Diagnosis Date  . PVD (peripheral vascular disease)   . CAD (coronary artery disease)     a. 12/2005 - PCI to OM  . Hyperlipidemia   . Cerebrovascular accident   . DVT femoral (deep venous thrombosis) with thrombophlebitis   . GERD (gastroesophageal reflux disease)   . Edema   . Anxiety   . Vertigo   . IBS (irritable bowel syndrome)   . Duodenitis   . Gastritis   . Esophageal stricture   . Cellulitis   . Pulmonary embolism   . Prostatic hypertrophy   . Frequent falls   . Subdural hematoma   . PTSD (post-traumatic stress disorder)    . Vertigo   . TIA (transient ischemic attack)     a. 01/2006 and 05/2006.   [name] Hiatal hernia     periferal neuropathy  . Hx of echocardiogram     Echo 9/13:  Mild LVH, EF 60-65%, Gr 1 diast dysfn, mild AI, mild BAE    Past Surgical History  Procedure Laterality Date  . Hand surgery    . Angioplasty    . Carotid endarterectomy      Left  . Bare-metal stenting      left circumflex  . Bilateral cataract surgery      Family History  Problem Relation Age of Onset  . Colon cancer Neg Hx   . Coronary artery disease Brother   . Heart attack Father     History  Substance Use Topics  . Smoking status: Never Smoker   . Smokeless tobacco: Never Used  . Alcohol Use: Yes     Comment: Wine occasional      Review of Systems  Constitutional: Negative for fever and chills.  Respiratory: Negative for shortness of breath.   Gastrointestinal: Positive for abdominal pain. Negative for constipation.  All other systems reviewed and are negative.    Allergies  Amoxicillin-pot clavulanate; Fludrocortisone acetate; and Scopace  Home Medications   Current Outpatient Rx  Name  Route  Sig  Dispense  Refill  . ALPRAZolam (XANAX) 0.25  MG tablet   Oral   Take 0.25 mg by mouth at bedtime as needed. anxiety         . cholecalciferol (VITAMIN D) 1000 UNITS tablet   Oral   Take 1,000 Units by mouth daily.         [name] docusate sodium (COLACE) 100 MG capsule   Oral   Take 100 mg by mouth at bedtime.         . DULoxetine (CYMBALTA) 30 MG capsule   Oral   Take 30 mg by mouth at bedtime.         . gabapentin (NEURONTIN) 300 MG capsule   Oral   Take 600 mg by mouth 2 (two) times daily.          [name] HYDROcodone-acetaminophen (NORCO) 5-325 MG per tablet   Oral   Take 1 tablet by mouth every 6 (six) hours as needed. Pain          . latanoprost (XALATAN) 0.005 % ophthalmic solution   Both Eyes   Place 1 drop into both eyes at bedtime.         . meclizine (ANTIVERT) 12.5  MG tablet   Oral   Take 12.5 mg by mouth 3 (three) times daily as needed. For dizziness.         . nitroGLYCERIN (NITROSTAT) 0.4 MG SL tablet   Sublingual   Place 0.4 mg under the tongue every 5 (five) minutes as needed. Chest pain         . omeprazole (PRILOSEC) 20 MG capsule   Oral   Take 20 mg by mouth 2 (two) times daily before a meal.          . polyethylene glycol powder (MIRALAX) powder   Oral   Take 17 g by mouth at bedtime. Constipation          . promethazine (PHENERGAN) 25 MG tablet   Oral   Take 25 mg by mouth every 6 (six) hours as needed. nausea         . Rivaroxaban (XARELTO) 15 MG TABS tablet   Oral   Take 1 tablet (15 mg total) by mouth daily with supper.   30 tablet   6   . Tamsulosin HCl (FLOMAX) 0.4 MG CAPS   Oral   Take 0.4 mg by mouth daily.          [name] zolpidem (AMBIEN) 5 MG tablet   Oral   Take 5 mg by mouth at bedtime as needed. For insomnia.           BP 188/77  Temp(Src) 98.7 F (37.1 C) (Oral)  Resp 16  SpO2 96%  Physical Exam  Nursing note and vitals reviewed. Constitutional: He is oriented to person, place, and time. He appears well-developed and well-nourished. No distress.  HENT:  Head: Normocephalic and atraumatic.  Mouth/Throat: Oropharynx is clear and moist.  Neck: Normal range of motion. Neck supple.  Cardiovascular: Normal rate and regular rhythm.   No murmur heard. Pulmonary/Chest: Effort normal and breath sounds normal. No respiratory distress.  Abdominal: Soft. Bowel sounds are normal. He exhibits no distension. There is no rebound and no guarding.  There is ttp in the periumbilical and epigastric regions.  There is no rebound or guarding.  Musculoskeletal: Normal range of motion. He exhibits no edema.  Neurological: He is alert and oriented to person, place, and time.  Skin: Skin is warm and dry. He is not diaphoretic.    ED  Course  Procedures (including critical care time)  Labs Reviewed  CBC WITH  DIFFERENTIAL  COMPREHENSIVE METABOLIC PANEL  LIPASE, BLOOD  URINALYSIS, ROUTINE W REFLEX MICROSCOPIC   No results found.   No diagnosis found.    MDM  The workup reveals no elevation of wbc and the ct reveals an area of enteritis within the central abdomen with an infectious etiology favored.  There are also several dilated bowel loops, however he is not vomiting and he does not appear obstructed.  The abdomen exam is benign and he otherwise appears well.  Will treat with flagyl, pain meds.  To follow up prn if not improving.          Geoffery Lyons, MD 07/26/12 [phone]

## 2012-07-26 NOTE — Progress Notes (Signed)
Subjective:    Patient ID: Jason [name], male    DOB: 1919-10-19, 79 y.o.   MRN: [id number]  HPI  Patient is here today for followup of his abdominal pain. He recently saw [facility]. I reviewed her office note. KUB of abdomen was negative for a bowel obstruction. His CMP, CBC, urinalysis were normal. He continues to have daily abdominal pain. The pain seems to be epigastric in nature. It is sharp and intense after eating. He denies any constipation. He is having to use a daily laxative however to go the restroom. He denies any melena or hematochezia. He denies any fever. The pain is colicky in nature it is not reproduced with palpation. It does not radiate.  He does have a history of an ileus.  Past Medical History  Diagnosis Date  . PVD (peripheral vascular disease)   . CAD (coronary artery disease)     a. 12/2005 - PCI to OM  . Hyperlipidemia   . Cerebrovascular accident   . DVT femoral (deep venous thrombosis) with thrombophlebitis   . GERD (gastroesophageal reflux disease)   . Edema   . Anxiety   . Vertigo   . IBS (irritable bowel syndrome)   . Duodenitis   . Gastritis   . Esophageal stricture   . Cellulitis   . Pulmonary embolism   . Prostatic hypertrophy   . Frequent falls   . Subdural hematoma   . PTSD (post-traumatic stress disorder)   . Vertigo   . TIA (transient ischemic attack)     a. 01/2006 and 05/2006.   [name] Hiatal hernia     periferal neuropathy  . Hx of echocardiogram     Echo 9/13:  Mild LVH, EF 60-65%, Gr 1 diast dysfn, mild AI, mild BAE   Current Outpatient Prescriptions on File Prior to Visit  Medication Sig Dispense Refill  . ALPRAZolam (XANAX) 0.25 MG tablet Take 0.25 mg by mouth at bedtime as needed. anxiety      . cholecalciferol (VITAMIN D) 1000 UNITS tablet Take 1,000 Units by mouth daily.      [name] docusate sodium (COLACE) 100 MG capsule Take 100 mg by mouth at bedtime.      . DULoxetine (CYMBALTA) 30 MG capsule Take 30 mg by mouth at bedtime.       . gabapentin (NEURONTIN) 300 MG capsule Take 600 mg by mouth 2 (two) times daily.       [name] HYDROcodone-acetaminophen (NORCO) 5-325 MG per tablet Take 1 tablet by mouth every 6 (six) hours as needed. Pain       . latanoprost (XALATAN) 0.005 % ophthalmic solution Place 1 drop into both eyes at bedtime.      . meclizine (ANTIVERT) 12.5 MG tablet Take 12.5 mg by mouth 3 (three) times daily as needed. For dizziness.      . nitroGLYCERIN (NITROSTAT) 0.4 MG SL tablet Place 0.4 mg under the tongue every 5 (five) minutes as needed. Chest pain      . omeprazole (PRILOSEC) 20 MG capsule Take 20 mg by mouth 2 (two) times daily before a meal.       . polyethylene glycol powder (MIRALAX) powder Take 17 g by mouth at bedtime. Constipation       . promethazine (PHENERGAN) 25 MG tablet Take 25 mg by mouth every 6 (six) hours as needed. nausea      . Rivaroxaban (XARELTO) 15 MG TABS tablet Take 1 tablet (15 mg total) by mouth daily with supper.  30 tablet  6  . Tamsulosin HCl (FLOMAX) 0.4 MG CAPS Take 0.4 mg by mouth daily.       [name] zolpidem (AMBIEN) 5 MG tablet Take 5 mg by mouth at bedtime as needed. For insomnia.       No current facility-administered medications on file prior to visit.   Allergies  Allergen Reactions  . Amoxicillin-Pot Clavulanate Nausea And Vomiting    daughter states he can tolerate Amoxicillin ok  . Fludrocortisone Acetate Other (See Comments)    unknown  . Scopace (Scopolamine) Other (See Comments)    *patch Unknown reaction     Review of Systems  All other systems reviewed and are negative.       Objective:   Physical Exam  Vitals reviewed. Eyes: No scleral icterus.  Neck: Neck supple. No thyromegaly present.  Cardiovascular: Normal rate, regular rhythm, normal heart sounds and intact distal pulses.   No murmur heard. Pulmonary/Chest: Effort normal and breath sounds normal. No respiratory distress. He has no wheezes. He has no rales. He exhibits no tenderness.   Abdominal: Soft. Bowel sounds are normal. He exhibits no distension and no mass. There is no tenderness. There is no rebound and no guarding.  Lymphadenopathy:    He has no cervical adenopathy.  Neurological: He is alert. No cranial nerve deficit. He exhibits normal muscle tone. Coordination normal.  Skin: Skin is warm and dry. No rash noted. No erythema. No pallor.          Assessment & Plan:  1. Abdominal  pain, other specified site Differential diagnosis includes biliary tract disease, peptic ulcer disease, irritable bowel syndrome, chronic constipation. I will obtain an ultrasound of the right quadrant to rule out biliary tract disease.  If negative consult GI for possible endoscopy versus obtaining a CAT scan of the abdomen and pelvis. - US Abdomen Complete; Future

## 2012-07-26 NOTE — ED Notes (Signed)
Pt escorted to discharge window. Pt verbalized understanding discharge instructions. In no acute distress.  

## 2012-08-01 ENCOUNTER — Other Ambulatory Visit: Payer: Medicare Other

## 2012-08-13 ENCOUNTER — Encounter: Payer: Self-pay | Admitting: Family Medicine

## 2012-08-13 ENCOUNTER — Ambulatory Visit (INDEPENDENT_AMBULATORY_CARE_PROVIDER_SITE_OTHER): Payer: Medicare Other | Admitting: Family Medicine

## 2012-08-13 VITALS — BP 156/80 | HR 68 | Temp 98.3°F | Resp 20 | Ht 69.0 in | Wt 194.0 lb

## 2012-08-13 DIAGNOSIS — I10 Essential (primary) hypertension: Secondary | ICD-10-CM

## 2012-08-13 NOTE — Progress Notes (Signed)
Subjective:    Patient ID: Jason [name], male    DOB: 1919-07-03, 79 y.o.   MRN: [id number]  HPI  Patient's family is looking to place him in an assisted living facility with his wife. This is mainly due to his wife's declining health and severe dementia. Otherwise Jason [name] is doing very well. He denies any chest pain shortness of breath or dyspnea on exertion his blood pressure today is slightly elevated at 156/80. His goal blood pressure would be less than 140/90. He is currently taking nothing to control his blood pressure. He is on Zarontin for DVTs and aspirin for his history of coronary artery disease.   Past Medical History  Diagnosis Date  . PVD (peripheral vascular disease)   . CAD (coronary artery disease)     a. 12/2005 - PCI to OM  . Hyperlipidemia   . Cerebrovascular accident   . DVT femoral (deep venous thrombosis) with thrombophlebitis   . GERD (gastroesophageal reflux disease)   . Edema   . Anxiety   . Vertigo   . IBS (irritable bowel syndrome)   . Duodenitis   . Gastritis   . Esophageal stricture   . Cellulitis   . Pulmonary embolism   . Prostatic hypertrophy   . Frequent falls   . Subdural hematoma   . PTSD (post-traumatic stress disorder)   . Vertigo   . TIA (transient ischemic attack)     a. 01/2006 and 05/2006.   [name] Hiatal hernia     periferal neuropathy  . Hx of echocardiogram     Echo 9/13:  Mild LVH, EF 60-65%, Gr 1 diast dysfn, mild AI, mild BAE   Current Outpatient Prescriptions on File Prior to Visit  Medication Sig Dispense Refill  . ALPRAZolam (XANAX) 0.25 MG tablet Take 0.25 mg by mouth at bedtime as needed. anxiety      . aspirin 81 MG tablet Take 81 mg by mouth daily.      . cholecalciferol (VITAMIN D) 1000 UNITS tablet Take 1,000 Units by mouth daily.      [name] docusate sodium (COLACE) 100 MG capsule Take 100 mg by mouth at bedtime.      . DULoxetine (CYMBALTA) 30 MG capsule Take 30 mg by mouth at bedtime.      . gabapentin (NEURONTIN) 300 MG  capsule Take 600 mg by mouth 2 (two) times daily.       [name] HYDROcodone-acetaminophen (NORCO) 5-325 MG per tablet Take 1 tablet by mouth every 6 (six) hours as needed. Pain       . latanoprost (XALATAN) 0.005 % ophthalmic solution Place 1 drop into both eyes at bedtime.      . meclizine (ANTIVERT) 12.5 MG tablet Take 12.5 mg by mouth 3 (three) times daily as needed. For dizziness.      . nitroGLYCERIN (NITROSTAT) 0.4 MG SL tablet Place 0.4 mg under the tongue every 5 (five) minutes as needed. Chest pain      . omeprazole (PRILOSEC) 20 MG capsule Take 20 mg by mouth 2 (two) times daily before a meal.       . polyethylene glycol powder (MIRALAX) powder Take 17 g by mouth at bedtime. Constipation       . promethazine (PHENERGAN) 25 MG tablet Take 25 mg by mouth every 6 (six) hours as needed. nausea      . Rivaroxaban (XARELTO) 15 MG TABS tablet Take 1 tablet (15 mg total) by mouth daily with supper.  30 tablet  6  . Tamsulosin HCl (FLOMAX) 0.4 MG CAPS Take 0.4 mg by mouth daily.       [name] zolpidem (AMBIEN) 5 MG tablet Take 5 mg by mouth at bedtime as needed. For insomnia.       No current facility-administered medications on file prior to visit.   Allergies  Allergen Reactions  . Amoxicillin-Pot Clavulanate Nausea And Vomiting    daughter states he can tolerate Amoxicillin ok  . Fludrocortisone Acetate Other (See Comments)    unknown  . Scopace (Scopolamine) Other (See Comments)    *patch Unknown reaction     Review of Systems  All other systems reviewed and are negative.       Objective:   Physical Exam  Vitals reviewed. Constitutional: He appears well-developed and well-nourished.  Cardiovascular: Normal rate, regular rhythm and normal heart sounds.   No murmur heard. Pulmonary/Chest: Effort normal and breath sounds normal. No respiratory distress. He has no wheezes. He has no rales. He exhibits no tenderness.  Abdominal: Soft. Bowel sounds are normal. He exhibits no distension.  There is no tenderness. There is no rebound.   he has trace bipedal edema. He has a loud left carotid bruit.        Assessment & Plan:  1. HTN (hypertension) Patient's blood pressure is not at goal. I have asked the patient's family to check his blood pressure every day for one week. They're to, with his values. His blood pressures greater than 140/90, I would recommend starting losartan 50 mg by mouth daily. The family elects not to work up the patient's left carotid bruit given his advanced age. If there is a greater than 70% stenosis, he would not tolerate surgery to correct it. Therefore we will not evaluate the lower bruit I hear on exam.

## 2012-08-14 ENCOUNTER — Encounter: Payer: Self-pay | Admitting: Family Medicine

## 2012-08-20 ENCOUNTER — Other Ambulatory Visit: Payer: Self-pay | Admitting: Physician Assistant

## 2012-08-26 ENCOUNTER — Ambulatory Visit (INDEPENDENT_AMBULATORY_CARE_PROVIDER_SITE_OTHER): Payer: Medicare Other | Admitting: Family Medicine

## 2012-08-26 DIAGNOSIS — Z111 Encounter for screening for respiratory tuberculosis: Secondary | ICD-10-CM

## 2012-08-28 ENCOUNTER — Other Ambulatory Visit: Payer: Self-pay | Admitting: Neurology

## 2012-09-03 ENCOUNTER — Telehealth: Payer: Self-pay | Admitting: Family Medicine

## 2012-09-03 MED ORDER — ALPRAZOLAM 0.25 MG PO TABS: ORAL_TABLET | ORAL | Status: DC

## 2012-09-03 MED ORDER — HYDROCODONE-ACETAMINOPHEN 5-325 MG PO TABS: 1.0000 | ORAL_TABLET | Freq: Four times a day (QID) | ORAL | Status: DC | PRN

## 2012-09-03 NOTE — Telephone Encounter (Signed)
rx faxed to [facility] lab for assisted living

## 2012-09-06 ENCOUNTER — Ambulatory Visit (INDEPENDENT_AMBULATORY_CARE_PROVIDER_SITE_OTHER): Payer: Medicare Other | Admitting: Family Medicine

## 2012-09-06 VITALS — BP 130/80 | HR 78 | Temp 96.5°F | Resp 20 | Wt 194.0 lb

## 2012-09-06 DIAGNOSIS — M179 Osteoarthritis of knee, unspecified: Secondary | ICD-10-CM

## 2012-09-06 DIAGNOSIS — N63 Unspecified lump in unspecified breast: Secondary | ICD-10-CM

## 2012-09-06 DIAGNOSIS — M1710 Unilateral primary osteoarthritis, unspecified knee: Secondary | ICD-10-CM

## 2012-09-06 DIAGNOSIS — N644 Mastodynia: Secondary | ICD-10-CM

## 2012-09-06 DIAGNOSIS — R609 Edema, unspecified: Secondary | ICD-10-CM

## 2012-09-06 DIAGNOSIS — IMO0002 Reserved for concepts with insufficient information to code with codable children: Secondary | ICD-10-CM

## 2012-09-06 NOTE — Patient Instructions (Addendum)
Ultrasound to be done on breast Cortisone injection in right knee  Continue aleve or take 2 tylenol in AM Warm compress on chest

## 2012-09-06 NOTE — Progress Notes (Addendum)
  Subjective:    Patient ID: Jason [name], male    DOB: [birth year], 79 y.o.   MRN: [id number]  HPI  Pt here with bilateral breast pain for past 6 months, feels it is worsening, very tender around both nipples, feel hard tissue on right breast. Denies drainage from nipples. His typical pain medications do not help. Right knee pain on and off. Occasionally it swells, worse after his aerobic classes at the ALF. Friend of family concerned about his leg swelling, needs an order to have his compression hose put on as directed by PCP.   Review of Systems  GEN- denies fatigue, fever, weight loss,weakness, recent illness HEENT- denies eye drainage, change in vision, nasal discharge, CVS- denies chest pain, palpitations RESP- denies SOB, cough, wheeze ABD- denies N/V, change in stools, abd pain GU- denies dysuria, hematuria, dribbling, incontinence MSK- + joint pain, muscle aches, injury Neuro- denies headache, dizziness, syncope, seizure activity      Objective:   Physical Exam GEN- NAD, alert and oriented x3 HEENT- PERRL, EOMI, non injected sclera, pink conjunctiva, MMM, oropharynx clear Neck- Supple, no LAD Nodes- no axillary nodes CVS- RRR, no murmur RESP-CTAB Chest- TTP over bilat nipples and breast tissue adjacent. Right breast +firm tissue palpated at 7 oclock postion, TTP, non fluctuance, no discrete mass, no discharge from either nipple. Left breast- TTP diffusely around nipple, no fluctuance, no induration, no discrete mass. ABD-NABS,soft,NT,ND MSK- Right knee- Normal inspection, no effusion, fair ROM, +crepitus, ligaments in tact EXT- +pitting edema Pulses- Radial, DP- 2+  Procedure- Knee injection - performed by Dr. Tanya Nones Verbal consent from patient , risks and benefits discussed Anti-septic- ETOH wipes Anesthesia- Cold Spray Injection- Kenolog , lidocaine, marcaine 1:2:2 Bleeding- minimal Pt tolerated procedure well Bandage applied      Assessment & Plan:

## 2012-09-08 ENCOUNTER — Encounter: Payer: Self-pay | Admitting: Family Medicine

## 2012-09-08 DIAGNOSIS — M1710 Unilateral primary osteoarthritis, unspecified knee: Secondary | ICD-10-CM | POA: Insufficient documentation

## 2012-09-08 DIAGNOSIS — N644 Mastodynia: Secondary | ICD-10-CM | POA: Insufficient documentation

## 2012-09-08 DIAGNOSIS — R609 Edema, unspecified: Secondary | ICD-10-CM | POA: Insufficient documentation

## 2012-09-08 DIAGNOSIS — N63 Unspecified lump in unspecified breast: Secondary | ICD-10-CM | POA: Insufficient documentation

## 2012-09-08 DIAGNOSIS — M179 Osteoarthritis of knee, unspecified: Secondary | ICD-10-CM | POA: Insufficient documentation

## 2012-09-08 NOTE — Assessment & Plan Note (Signed)
Per above , will allow moist heat to chest, increase acetaminophen in AM

## 2012-09-08 NOTE — Assessment & Plan Note (Signed)
He does have noticeable palpable difference on right side vs left Obtain US of bilat breast Most likley gynecomastia since is he is male but will rule out cyst or malignancy Family and pt worried and would like imaging

## 2012-09-08 NOTE — Assessment & Plan Note (Signed)
Orders written for the compression hose to be applied

## 2012-09-08 NOTE — Assessment & Plan Note (Signed)
Treat for OA knee, has been taking OTC medication with little improvement Steroid injection given, still able to ambulate Continue Icing  Injection done by pt PCP

## 2012-09-09 ENCOUNTER — Other Ambulatory Visit: Payer: Self-pay | Admitting: *Deleted

## 2012-09-09 NOTE — Addendum Note (Signed)
Addended by: Milinda Antis F on: 09/09/2012 10:52 AM   Modules accepted: Orders

## 2012-09-16 ENCOUNTER — Telehealth: Payer: Self-pay | Admitting: Family Medicine

## 2012-09-16 MED ORDER — HYDROCODONE-ACETAMINOPHEN 5-325 MG PO TABS: 1.0000 | ORAL_TABLET | Freq: Four times a day (QID) | ORAL | Status: DC | PRN

## 2012-09-16 NOTE — Telephone Encounter (Signed)
Ok to refill x 2  

## 2012-09-16 NOTE — Telephone Encounter (Signed)
[facility]

## 2012-09-16 NOTE — Telephone Encounter (Signed)
?   OK to Refill  

## 2012-09-20 ENCOUNTER — Ambulatory Visit
Admission: RE | Admit: 2012-09-20 | Discharge: 2012-09-20 | Disposition: A | Discharge status: Home / Self Care | Payer: Medicare Other | Source: Ambulatory Visit | Attending: Family Medicine | Admitting: Family Medicine

## 2012-09-20 DIAGNOSIS — N63 Unspecified lump in unspecified breast: Secondary | ICD-10-CM

## 2012-09-20 DIAGNOSIS — N644 Mastodynia: Secondary | ICD-10-CM

## 2012-10-04 ENCOUNTER — Telehealth: Payer: Self-pay | Admitting: Family Medicine

## 2012-10-04 MED ORDER — PROMETHAZINE HCL 25 MG PO TABS: 25.0000 mg | ORAL_TABLET | Freq: Four times a day (QID) | ORAL | Status: DC | PRN

## 2012-10-04 NOTE — Telephone Encounter (Signed)
Rx Refilled  

## 2012-10-11 ENCOUNTER — Telehealth: Payer: Self-pay | Admitting: Family Medicine

## 2012-10-11 MED ORDER — PROMETHAZINE HCL 25 MG PO TABS: 25.0000 mg | ORAL_TABLET | Freq: Four times a day (QID) | ORAL | Status: DC | PRN

## 2012-10-11 NOTE — Telephone Encounter (Signed)
Rx Refilled  

## 2012-11-28 ENCOUNTER — Ambulatory Visit (INDEPENDENT_AMBULATORY_CARE_PROVIDER_SITE_OTHER): Payer: Medicare Other | Admitting: Family Medicine

## 2012-11-28 ENCOUNTER — Encounter: Payer: Self-pay | Admitting: Family Medicine

## 2012-11-28 VITALS — BP 110/74 | HR 78 | Temp 97.7°F | Resp 12 | Ht 70.0 in | Wt 185.0 lb

## 2012-11-28 DIAGNOSIS — R110 Nausea: Secondary | ICD-10-CM

## 2012-11-28 DIAGNOSIS — R531 Weakness: Secondary | ICD-10-CM

## 2012-11-28 DIAGNOSIS — Z23 Encounter for immunization: Secondary | ICD-10-CM

## 2012-11-28 DIAGNOSIS — R5381 Other malaise: Secondary | ICD-10-CM

## 2012-11-28 DIAGNOSIS — R300 Dysuria: Secondary | ICD-10-CM

## 2012-11-28 LAB — URINALYSIS, ROUTINE W REFLEX MICROSCOPIC
Glucose, UA: NEGATIVE mg/dL
Hgb urine dipstick: NEGATIVE
Ketones, ur: NEGATIVE mg/dL
Protein, ur: NEGATIVE mg/dL
pH: 6.5 (ref 5.0–8.0)

## 2012-11-28 MED ORDER — ONDANSETRON HCL 4 MG PO TABS: 4.0000 mg | ORAL_TABLET | Freq: Three times a day (TID) | ORAL | Status: DC | PRN

## 2012-11-28 NOTE — Progress Notes (Signed)
Subjective:    Patient ID: Jason [name], male    DOB: 1919-07-07, 79 y.o.   MRN: [id number]  HPI  Patient presents today with his daughter complaining of weakness. For the last week, the patient has become nauseated with the, he has a poor appetite, he is been constipated, and he is become progressively weaker. Chart reviewed shows that his weight is down approximately 10 pounds since the summer. The majority of the weight loss has been recently. He is not needing or drinking very much. He denies fever. He denies abdominal pain. He is constipated. He is going to the bathroom every other day and only having small hard round "balls."  He denies any vomiting. He is having some mild dysuria reports having small amount but foul-smelling urine.  He denies any cough chest pain dyspnea on exertion. He does report some stress and anxiety. His wife recently had cellulitis and was admitted to the hospital. She has since been transferred to a skilled nursing facility for the next 6 weeks. This has caused a tremendous emotional strain in his life. His abdominal symptoms coincide with this occurrence. Past Medical History  Diagnosis Date  . PVD (peripheral vascular disease)   . CAD (coronary artery disease)     a. 12/2005 - PCI to OM  . Hyperlipidemia   . Cerebrovascular accident   . DVT femoral (deep venous thrombosis) with thrombophlebitis   . GERD (gastroesophageal reflux disease)   . Edema   . Anxiety   . Vertigo   . IBS (irritable bowel syndrome)   . Duodenitis   . Gastritis   . Esophageal stricture   . Cellulitis   . Pulmonary embolism   . Prostatic hypertrophy   . Frequent falls   . Subdural hematoma   . PTSD (post-traumatic stress disorder)   . Vertigo   . TIA (transient ischemic attack)     a. 01/2006 and 05/2006.   [name] Hiatal hernia     periferal neuropathy  . Hx of echocardiogram     Echo 9/13:  Mild LVH, EF 60-65%, Gr 1 diast dysfn, mild AI, mild BAE   Current Outpatient Prescriptions  on File Prior to Visit  Medication Sig Dispense Refill  . ALPRAZolam (XANAX) 0.25 MG tablet TAKE 1 TABLET BY MOUTH TWICE DAILY AS NEEDED  60 tablet  0  . aspirin 81 MG tablet Take 81 mg by mouth daily.      . cholecalciferol (VITAMIN D) 1000 UNITS tablet Take 1,000 Units by mouth daily.      [name] docusate sodium (COLACE) 100 MG capsule Take 100 mg by mouth at bedtime.      . DULoxetine (CYMBALTA) 30 MG capsule Take 30 mg by mouth at bedtime.      . gabapentin (NEURONTIN) 300 MG capsule Take 600 mg by mouth 2 (two) times daily.       [name] HYDROcodone-acetaminophen (NORCO/VICODIN) 5-325 MG per tablet Take 1 tablet by mouth every 6 (six) hours as needed. Pain  12 tablet  2  . latanoprost (XALATAN) 0.005 % ophthalmic solution Place 1 drop into both eyes at bedtime.      . meclizine (ANTIVERT) 12.5 MG tablet Take 12.5 mg by mouth 3 (three) times daily as needed. For dizziness.      . nitroGLYCERIN (NITROSTAT) 0.4 MG SL tablet Place 0.4 mg under the tongue every 5 (five) minutes as needed. Chest pain      . omeprazole (PRILOSEC) 20 MG capsule Take 20 mg by  mouth 2 (two) times daily before a meal.       . polyethylene glycol powder (MIRALAX) powder Take 17 g by mouth at bedtime. Constipation       . promethazine (PHENERGAN) 25 MG tablet Take 1 tablet (25 mg total) by mouth every 6 (six) hours as needed. nausea  30 tablet  1  . Tamsulosin HCl (FLOMAX) 0.4 MG CAPS Take 0.4 mg by mouth daily.       . vitamin C (ASCORBIC ACID) 500 MG tablet Take 500 mg by mouth daily.      [name] 15 MG TABS tablet TAKE 1 TABLET BY MOUTH EVERY DAY WITH SUPPER  30 tablet  2  . zolpidem (AMBIEN) 5 MG tablet Take 5 mg by mouth at bedtime as needed. For insomnia.       No current facility-administered medications on file prior to visit.   Allergies  Allergen Reactions  . Amoxicillin-Pot Clavulanate Nausea And Vomiting    daughter states he can tolerate Amoxicillin ok  . Fludrocortisone Acetate Other (See Comments)     unknown  . Scopace [Scopolamine] Other (See Comments)    *patch Unknown reaction   History   Social History  . Marital Status: Married    Spouse Name: N/A    Number of Children: N/A  . Years of Education: N/A   Occupational History  . Not on file.   Social History Main Topics  . Smoking status: Never Smoker   . Smokeless tobacco: Never Used  . Alcohol Use: Yes     Comment: Wine occasional  . Drug Use: No  . Sexual Activity: No   Other Topics Concern  . 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       Objective:   Physical Exam  Vitals reviewed. Constitutional: He appears well-developed and well-nourished. No distress.  HENT:  Head: Normocephalic and atraumatic.  Right Ear: External ear normal.  Left Ear: External ear normal.  Nose: Nose normal.  Mouth/Throat: Oropharynx is clear and moist. No oropharyngeal exudate.  Eyes: Conjunctivae are normal. Pupils are equal, round, and reactive to light. No scleral icterus.  Neck: Neck supple. No JVD present. No thyromegaly present.  Cardiovascular: Normal rate, regular rhythm and normal heart sounds.   No murmur heard. Pulmonary/Chest: Effort normal and breath sounds normal. No stridor. No respiratory distress. He has no wheezes. He has no rales.  Abdominal: Soft. Bowel sounds are normal. He exhibits distension. He exhibits no mass. There is no tenderness. There is no rebound and no guarding.  Musculoskeletal: He exhibits no edema.  Lymphadenopathy:    He has no cervical adenopathy.  Skin: He is not diaphoretic.    Patient is bloated with hyperactive bowel sounds. However there is no evidence of a bowel obstruction.      Assessment & Plan:  1. Dysuria Urinalysis is completely clear. I imagine foul-smelling urine is likely due to dehydration due to his recent poor oral intake - Urinalysis,  Routine w reflex microscopic  2. Nausea alone I believe this is likely due to anxiety causing anorexia and early satiety. I will obtain a CBC and CMP. I recommended Zofran 2 mg 30 minutes prior to lunch and dinner. I also would treat the constipation to alleviate some of the nausea with MiraLax once a day. Recheck in 1 week if no  better or sooner if worse. - COMPLETE METABOLIC PANEL WITH GFR - CBC with Differential  3. Need for prophylactic vaccination and inoculation against influenza - Flu Vaccine QUAD 36+ mos IM  4. Weakness I feel this is likely due to dehydration and poor oral intake. Recommended pushing fluids and trying to increase oral intake to treat his weakness. Recheck in one week or sooner if worse.

## 2012-11-29 LAB — COMPLETE METABOLIC PANEL WITH GFR
BUN: 17 mg/dL (ref 6–23)
CO2: 27 mEq/L (ref 19–32)
Calcium: 9 mg/dL (ref 8.4–10.5)
Chloride: 104 mEq/L (ref 96–112)
Creat: 1.28 mg/dL (ref 0.50–1.35)
GFR, Est African American: 56 mL/min — ABNORMAL LOW
GFR, Est Non African American: 49 mL/min — ABNORMAL LOW
Glucose, Bld: 89 mg/dL (ref 70–99)

## 2012-11-29 LAB — CBC WITH DIFFERENTIAL/PLATELET
Eosinophils Absolute: 0.4 10*3/uL (ref 0.0–0.7)
Lymphs Abs: 2 10*3/uL (ref 0.7–4.0)
MCH: 32.6 pg (ref 26.0–34.0)
Neutro Abs: 4.1 10*3/uL (ref 1.7–7.7)
Neutrophils Relative %: 57 % (ref 43–77)
Platelets: 234 10*3/uL (ref 150–400)
RBC: 4.57 MIL/uL (ref 4.22–5.81)
WBC: 7.2 10*3/uL (ref 4.0–10.5)

## 2012-12-12 ENCOUNTER — Encounter: Payer: Self-pay | Admitting: Family Medicine

## 2012-12-12 ENCOUNTER — Ambulatory Visit (INDEPENDENT_AMBULATORY_CARE_PROVIDER_SITE_OTHER): Payer: Medicare Other | Admitting: Family Medicine

## 2012-12-12 ENCOUNTER — Other Ambulatory Visit: Payer: Self-pay | Admitting: Cardiovascular Disease

## 2012-12-12 VITALS — BP 136/78 | HR 76 | Temp 98.1°F | Resp 16 | Ht 70.0 in | Wt 188.0 lb

## 2012-12-12 DIAGNOSIS — J019 Acute sinusitis, unspecified: Secondary | ICD-10-CM

## 2012-12-12 MED ORDER — PROMETHAZINE HCL 25 MG PO TABS: 25.0000 mg | ORAL_TABLET | Freq: Four times a day (QID) | ORAL | Status: DC | PRN

## 2012-12-12 MED ORDER — HYDROCODONE-HOMATROPINE 5-1.5 MG/5ML PO SYRP: 5.0000 mL | ORAL_SOLUTION | Freq: Four times a day (QID) | ORAL | Status: DC | PRN

## 2012-12-12 MED ORDER — AZITHROMYCIN 250 MG PO TABS: ORAL_TABLET | ORAL | Status: DC

## 2012-12-12 MED ORDER — MECLIZINE HCL 12.5 MG PO TABS: 12.5000 mg | ORAL_TABLET | Freq: Three times a day (TID) | ORAL | Status: DC | PRN

## 2012-12-12 NOTE — Progress Notes (Signed)
Subjective:    Patient ID: Jason [name], male    DOB: 01-18-1920, 79 y.o.   MRN: [id number]  HPI Patient reports a 5 day history of postnasal drip, sinus congestion, and nonproductive cough. The cough is more of a tickle in his throat. It is worse at night when he lies down. He denies any fevers or chills. He denies any shortness of breath or pleurisy. He denies any sinus pain or headaches. He is having a sore throat which is mainly due to drainage. He denies any nausea or vomiting or diarrhea. Past Medical History  Diagnosis Date  . PVD (peripheral vascular disease)   . CAD (coronary artery disease)     a. 12/2005 - PCI to OM  . Hyperlipidemia   . Cerebrovascular accident   . DVT femoral (deep venous thrombosis) with thrombophlebitis   . GERD (gastroesophageal reflux disease)   . Edema   . Anxiety   . Vertigo   . IBS (irritable bowel syndrome)   . Duodenitis   . Gastritis   . Esophageal stricture   . Cellulitis   . Pulmonary embolism   . Prostatic hypertrophy   . Frequent falls   . Subdural hematoma   . PTSD (post-traumatic stress disorder)   . Vertigo   . TIA (transient ischemic attack)     a. 01/2006 and 05/2006.   [name] Hiatal hernia     periferal neuropathy  . Hx of echocardiogram     Echo 9/13:  Mild LVH, EF 60-65%, Gr 1 diast dysfn, mild AI, mild BAE   Past Surgical History  Procedure Laterality Date  . Hand surgery    . Angioplasty    . Carotid endarterectomy      Left  . Bare-metal stenting      left circumflex  . Bilateral cataract surgery     Current Outpatient Prescriptions on File Prior to Visit  Medication Sig Dispense Refill  . ALPRAZolam (XANAX) 0.25 MG tablet TAKE 1 TABLET BY MOUTH TWICE DAILY AS NEEDED  60 tablet  0  . cholecalciferol (VITAMIN D) 1000 UNITS tablet Take 1,000 Units by mouth daily.      [name] docusate sodium (COLACE) 100 MG capsule Take 100 mg by mouth at bedtime.      . DULoxetine (CYMBALTA) 30 MG capsule Take 30 mg by mouth at bedtime.       [name] HYDROcodone-acetaminophen (NORCO/VICODIN) 5-325 MG per tablet Take 1 tablet by mouth every 6 (six) hours as needed. Pain  12 tablet  2  . latanoprost (XALATAN) 0.005 % ophthalmic solution Place 1 drop into both eyes at bedtime.      . nitroGLYCERIN (NITROSTAT) 0.4 MG SL tablet Place 0.4 mg under the tongue every 5 (five) minutes as needed. Chest pain      . omeprazole (PRILOSEC) 20 MG capsule Take 20 mg by mouth 2 (two) times daily before a meal.       . polyethylene glycol powder (MIRALAX) powder Take 17 g by mouth at bedtime. Constipation       . Tamsulosin HCl (FLOMAX) 0.4 MG CAPS Take 0.4 mg by mouth daily.       . vitamin C (ASCORBIC ACID) 500 MG tablet Take 500 mg by mouth daily.      [name] 15 MG TABS tablet TAKE 1 TABLET BY MOUTH EVERY DAY WITH SUPPER  30 tablet  2  . zolpidem (AMBIEN) 5 MG tablet Take 5 mg by mouth at bedtime as needed. For insomnia.  No current facility-administered medications on file prior to visit.   Allergies  Allergen Reactions  . Amoxicillin-Pot Clavulanate Nausea And Vomiting    daughter states he can tolerate Amoxicillin ok  . Fludrocortisone Acetate Other (See Comments)    unknown  . Scopace [Scopolamine] Other (See Comments)    *patch Unknown reaction   History   Social History  . Marital Status: Married    Spouse Name: N/A    Number of Children: N/A  . Years of Education: N/A   Occupational History  . Not on file.   Social History Main Topics  . Smoking status: Never Smoker   . Smokeless tobacco: Never Used  . Alcohol Use: Yes     Comment: Wine occasional  . Drug Use: No  . Sexual Activity: No   Other Topics Concern  . 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       Objective:   Physical Exam  Constitutional: He appears well-developed and well-nourished.  HENT:  Head: Normocephalic.   Right Ear: External ear normal.  Left Ear: External ear normal.  Nose: Nose normal.  Mouth/Throat: Oropharynx is clear and moist. No oropharyngeal exudate.  Eyes: Conjunctivae are normal. Pupils are equal, round, and reactive to light. No scleral icterus.  Neck: Neck supple. No JVD present. No thyromegaly present.  Cardiovascular: Normal rate, regular rhythm, normal heart sounds and intact distal pulses.  Exam reveals no gallop and no friction rub.   No murmur heard. Pulmonary/Chest: Effort normal and breath sounds normal. No respiratory distress. He has no wheezes. He has no rales. He exhibits no tenderness.  Lymphadenopathy:    He has no cervical adenopathy.          Assessment & Plan:  1. Sinusitis, acute I believe the patient likely has viral sinusitis or an upper respiratory infection. I recommended Mucinex 400 mg by mouth every 4 hours as needed for cough and congestion. I recommended tincture of time. I did give the patient prescription for Hycodan cough syrup 1 teaspoon every 6 hours as needed for cough. Also gave his daughter prescription for a Z-Pak. I gave her strict instructions not to fill the prescription unless his symptoms worsen dramatically including fever, purulent sputum, purulent nasal discharge, or severe sinus pain.  I believe there will be self limited resolution in 5 days. - azithromycin (ZITHROMAX) 250 MG tablet; 2 tabs poqday 1, 1 poqday 2-5  Dispense: 6 tablet; Refill: 0

## 2012-12-16 ENCOUNTER — Observation Stay (HOSPITAL_COMMUNITY)
Admission: EM | Admit: 2012-12-16 | Discharge: 2012-12-17 | Payer: Medicare Other | Attending: Internal Medicine | Admitting: Internal Medicine

## 2012-12-16 ENCOUNTER — Emergency Department (HOSPITAL_COMMUNITY): Payer: Medicare Other

## 2012-12-16 ENCOUNTER — Encounter (HOSPITAL_COMMUNITY): Payer: Self-pay | Admitting: *Deleted

## 2012-12-16 DIAGNOSIS — I739 Peripheral vascular disease, unspecified: Secondary | ICD-10-CM

## 2012-12-16 DIAGNOSIS — R600 Localized edema: Secondary | ICD-10-CM

## 2012-12-16 DIAGNOSIS — E860 Dehydration: Secondary | ICD-10-CM

## 2012-12-16 DIAGNOSIS — G609 Hereditary and idiopathic neuropathy, unspecified: Secondary | ICD-10-CM | POA: Insufficient documentation

## 2012-12-16 DIAGNOSIS — Z79899 Other long term (current) drug therapy: Secondary | ICD-10-CM | POA: Insufficient documentation

## 2012-12-16 DIAGNOSIS — Z7901 Long term (current) use of anticoagulants: Secondary | ICD-10-CM | POA: Insufficient documentation

## 2012-12-16 DIAGNOSIS — N183 Chronic kidney disease, stage 3 unspecified: Secondary | ICD-10-CM

## 2012-12-16 DIAGNOSIS — N63 Unspecified lump in unspecified breast: Secondary | ICD-10-CM

## 2012-12-16 DIAGNOSIS — N400 Enlarged prostate without lower urinary tract symptoms: Secondary | ICD-10-CM | POA: Insufficient documentation

## 2012-12-16 DIAGNOSIS — M1710 Unilateral primary osteoarthritis, unspecified knee: Secondary | ICD-10-CM

## 2012-12-16 DIAGNOSIS — R609 Edema, unspecified: Secondary | ICD-10-CM

## 2012-12-16 DIAGNOSIS — R7401 Elevation of levels of liver transaminase levels: Secondary | ICD-10-CM

## 2012-12-16 DIAGNOSIS — I10 Essential (primary) hypertension: Secondary | ICD-10-CM

## 2012-12-16 DIAGNOSIS — J019 Acute sinusitis, unspecified: Secondary | ICD-10-CM | POA: Insufficient documentation

## 2012-12-16 DIAGNOSIS — B029 Zoster without complications: Secondary | ICD-10-CM

## 2012-12-16 DIAGNOSIS — Z86711 Personal history of pulmonary embolism: Secondary | ICD-10-CM | POA: Insufficient documentation

## 2012-12-16 DIAGNOSIS — Z8673 Personal history of transient ischemic attack (TIA), and cerebral infarction without residual deficits: Secondary | ICD-10-CM | POA: Insufficient documentation

## 2012-12-16 DIAGNOSIS — N179 Acute kidney failure, unspecified: Secondary | ICD-10-CM

## 2012-12-16 DIAGNOSIS — M179 Osteoarthritis of knee, unspecified: Secondary | ICD-10-CM

## 2012-12-16 DIAGNOSIS — R0602 Shortness of breath: Secondary | ICD-10-CM

## 2012-12-16 DIAGNOSIS — Z86718 Personal history of other venous thrombosis and embolism: Secondary | ICD-10-CM

## 2012-12-16 DIAGNOSIS — I480 Paroxysmal atrial fibrillation: Secondary | ICD-10-CM

## 2012-12-16 DIAGNOSIS — K5909 Other constipation: Secondary | ICD-10-CM

## 2012-12-16 DIAGNOSIS — N644 Mastodynia: Secondary | ICD-10-CM

## 2012-12-16 DIAGNOSIS — A419 Sepsis, unspecified organism: Secondary | ICD-10-CM

## 2012-12-16 DIAGNOSIS — F4310 Post-traumatic stress disorder, unspecified: Secondary | ICD-10-CM | POA: Insufficient documentation

## 2012-12-16 DIAGNOSIS — Z9861 Coronary angioplasty status: Secondary | ICD-10-CM | POA: Insufficient documentation

## 2012-12-16 DIAGNOSIS — F411 Generalized anxiety disorder: Secondary | ICD-10-CM

## 2012-12-16 DIAGNOSIS — D696 Thrombocytopenia, unspecified: Secondary | ICD-10-CM

## 2012-12-16 DIAGNOSIS — I2510 Atherosclerotic heart disease of native coronary artery without angina pectoris: Secondary | ICD-10-CM

## 2012-12-16 DIAGNOSIS — E785 Hyperlipidemia, unspecified: Secondary | ICD-10-CM

## 2012-12-16 DIAGNOSIS — K589 Irritable bowel syndrome without diarrhea: Secondary | ICD-10-CM

## 2012-12-16 DIAGNOSIS — R42 Dizziness and giddiness: Secondary | ICD-10-CM

## 2012-12-16 DIAGNOSIS — H9190 Unspecified hearing loss, unspecified ear: Secondary | ICD-10-CM | POA: Insufficient documentation

## 2012-12-16 DIAGNOSIS — K219 Gastro-esophageal reflux disease without esophagitis: Secondary | ICD-10-CM

## 2012-12-16 DIAGNOSIS — I4891 Unspecified atrial fibrillation: Principal | ICD-10-CM | POA: Insufficient documentation

## 2012-12-16 DIAGNOSIS — K449 Diaphragmatic hernia without obstruction or gangrene: Secondary | ICD-10-CM | POA: Insufficient documentation

## 2012-12-16 DIAGNOSIS — N390 Urinary tract infection, site not specified: Secondary | ICD-10-CM

## 2012-12-16 LAB — CBC WITH DIFFERENTIAL/PLATELET
Basophils Absolute: 0 10*3/uL (ref 0.0–0.1)
Eosinophils Absolute: 0.5 10*3/uL (ref 0.0–0.7)
Eosinophils Relative: 8 % — ABNORMAL HIGH (ref 0–5)
Hemoglobin: 14.3 g/dL (ref 13.0–17.0)
Lymphocytes Relative: 24 % (ref 12–46)
Lymphs Abs: 1.6 10*3/uL (ref 0.7–4.0)
MCH: 32.6 pg (ref 26.0–34.0)
MCV: 96.3 fL (ref 78.0–100.0)
Monocytes Relative: 11 % (ref 3–12)
Neutro Abs: 3.9 10*3/uL (ref 1.7–7.7)
RBC: 4.38 MIL/uL (ref 4.22–5.81)

## 2012-12-16 LAB — COMPREHENSIVE METABOLIC PANEL
AST: 16 U/L (ref 0–37)
Albumin: 3.3 g/dL — ABNORMAL LOW (ref 3.5–5.2)
BUN: 19 mg/dL (ref 6–23)
Calcium: 9.5 mg/dL (ref 8.4–10.5)
Chloride: 101 mEq/L (ref 96–112)
GFR calc Af Amer: 49 mL/min — ABNORMAL LOW (ref >90)
Glucose, Bld: 106 mg/dL — ABNORMAL HIGH (ref 70–99)
Total Protein: 7.8 g/dL (ref 6.0–8.3)

## 2012-12-16 LAB — PROTIME-INR
INR: 1.19 (ref 0.00–1.49)
Prothrombin Time: 14.8 seconds (ref 11.6–15.2)

## 2012-12-16 LAB — PRO B NATRIURETIC PEPTIDE: Pro B Natriuretic peptide (BNP): 639.7 pg/mL — ABNORMAL HIGH (ref 0–450)

## 2012-12-16 LAB — TROPONIN I: Troponin I: <0.3 ng/mL (ref <0.30)

## 2012-12-16 MED ORDER — SODIUM CHLORIDE 0.9 % IV BOLUS (SEPSIS)
250.0000 mL | Freq: Once | INTRAVENOUS | Status: AC
  Administered 2012-12-16: 250 mL via INTRAVENOUS

## 2012-12-16 MED ORDER — FLUORESCEIN SODIUM 1 MG OP STRP
ORAL_STRIP | OPHTHALMIC | Status: AC
  Filled 2012-12-16: qty 1

## 2012-12-16 MED ORDER — IOHEXOL 350 MG/ML SOLN
100.0000 mL | Freq: Once | INTRAVENOUS | Status: AC | PRN
  Administered 2012-12-16: 100 mL via INTRAVENOUS

## 2012-12-16 MED ORDER — RIVAROXABAN 15 MG PO TABS
15.0000 mg | ORAL_TABLET | Freq: Once | ORAL | Status: AC
  Administered 2012-12-16: 15 mg via ORAL

## 2012-12-16 NOTE — ED Provider Notes (Signed)
CSN: [id number]     Arrival date & time 12/16/12  1357 History  This chart was scribed for Gilda Crease, MD by Quintella Reichert, ED scribe.  This patient was seen in room [facility] and the patient's care was started at 2:15 PM.   Chief Complaint  Patient presents with  . Shortness of Breath    The history is provided by the patient. No language interpreter was used.    HPI Comments: Jason [name] is a 79 y.o. male with h/o CAD, CVA, pulmonary embolism and hyperlipidemia who presents to the Emergency Department complaining of persistent exertional SOB that began today.  Pt states that he felt well on waking this morning but for the past several hours he has become very short of breath with even slight activity.  He feels fine at rest.  He states his SOB is accompanied by a "pressure" to his upper abdomen.  Daughter also notes that pt has vomited a few times over the past 2 days.  In addition she notes that pt was sick last week with a cough and was seen by his PCP.  His cough is resolved at present.  Pt notes he has not had a BM in 3 days.   Past Medical History  Diagnosis Date  . PVD (peripheral vascular disease)   . CAD (coronary artery disease)     a. 12/2005 - PCI to OM  . Hyperlipidemia   . Cerebrovascular accident   . DVT femoral (deep venous thrombosis) with thrombophlebitis   . GERD (gastroesophageal reflux disease)   . Edema   . Anxiety   . Vertigo   . IBS (irritable bowel syndrome)   . Duodenitis   . Gastritis   . Esophageal stricture   . Cellulitis   . Pulmonary embolism   . Prostatic hypertrophy   . Frequent falls   . Subdural hematoma   . PTSD (post-traumatic stress disorder)   . Vertigo   . TIA (transient ischemic attack)     a. 01/2006 and 05/2006.   [name] Hiatal hernia     periferal neuropathy  . Hx of echocardiogram     Echo 9/13:  Mild LVH, EF 60-65%, Gr 1 diast dysfn, mild AI, mild BAE    Past Surgical History  Procedure Laterality Date  . Hand  surgery    . Angioplasty    . Carotid endarterectomy      Left  . Bare-metal stenting      left circumflex  . Bilateral cataract surgery      Family History  Problem Relation Age of Onset  . Colon cancer Neg Hx   . Coronary artery disease Brother   . Heart attack Father     History  Substance Use Topics  . Smoking status: Never Smoker   . Smokeless tobacco: Never Used  . Alcohol Use: No     Comment: Wine occasional     Review of Systems  Respiratory: Positive for shortness of breath.   Gastrointestinal: Positive for nausea, vomiting and constipation.       "pressure" in abdomen  All other systems reviewed and are negative.     Allergies  Amoxicillin-pot clavulanate; Fludrocortisone acetate; and Scopace  Home Medications   Current Outpatient Rx  Name  Route  Sig  Dispense  Refill  . ALPRAZolam (XANAX) 0.25 MG tablet      TAKE 1 TABLET BY MOUTH TWICE DAILY AS NEEDED   60 tablet   0   .  azithromycin (ZITHROMAX) 250 MG tablet      2 tabs poqday 1, 1 poqday 2-5   6 tablet   0   . cholecalciferol (VITAMIN D) 1000 UNITS tablet   Oral   Take 1,000 Units by mouth daily.         [name] docusate sodium (COLACE) 100 MG capsule   Oral   Take 100 mg by mouth at bedtime.         . DULoxetine (CYMBALTA) 30 MG capsule   Oral   Take 30 mg by mouth at bedtime.         [name] HYDROcodone-acetaminophen (NORCO/VICODIN) 5-325 MG per tablet   Oral   Take 1 tablet by mouth every 6 (six) hours as needed. Pain   12 tablet   2   . HYDROcodone-homatropine (HYCODAN) 5-1.5 MG/5ML syrup   Oral   Take 5 mLs by mouth every 6 (six) hours as needed for cough.   120 mL   0   . latanoprost (XALATAN) 0.005 % ophthalmic solution   Both Eyes   Place 1 drop into both eyes at bedtime.         . meclizine (ANTIVERT) 12.5 MG tablet   Oral   Take 1 tablet (12.5 mg total) by mouth 3 (three) times daily as needed for dizziness. For dizziness.   30 tablet   2   . nitroGLYCERIN  (NITROSTAT) 0.4 MG SL tablet   Sublingual   Place 0.4 mg under the tongue every 5 (five) minutes as needed. Chest pain         . omeprazole (PRILOSEC) 20 MG capsule   Oral   Take 20 mg by mouth 2 (two) times daily before a meal.          . polyethylene glycol powder (MIRALAX) powder   Oral   Take 17 g by mouth at bedtime. Constipation          . promethazine (PHENERGAN) 25 MG tablet   Oral   Take 1 tablet (25 mg total) by mouth every 6 (six) hours as needed. nausea   30 tablet   1   . Tamsulosin HCl (FLOMAX) 0.4 MG CAPS   Oral   Take 0.4 mg by mouth daily.          . vitamin C (ASCORBIC ACID) 500 MG tablet   Oral   Take 500 mg by mouth daily.         [name] 15 MG TABS tablet      TAKE 1 TABLET BY MOUTH EVERY DAY WITH SUPPER   30 tablet   0     Needs follow up appointment with Dr Excell Seltzer, please ...   . zolpidem (AMBIEN) 5 MG tablet   Oral   Take 5 mg by mouth at bedtime as needed. For insomnia.          BP 189/82  Pulse 69  Temp(Src) 97.9 F (36.6 C) (Oral)  Resp 18  SpO2 97%  Physical Exam  Nursing note and vitals reviewed. Constitutional: He is oriented to person, place, and time. He appears well-developed and well-nourished. No distress.  HENT:  Head: Normocephalic and atraumatic.  Right Ear: Hearing normal.  Left Ear: Hearing normal.  Nose: Nose normal.  Mouth/Throat: Oropharynx is clear and moist and mucous membranes are normal.  Eyes: Conjunctivae and EOM are normal. Pupils are equal, round, and reactive to light.  Neck: Normal range of motion. Neck supple.  Cardiovascular: Regular rhythm, S1 normal  and S2 normal.  Exam reveals no gallop and no friction rub.   No murmur heard. Pulmonary/Chest: Effort normal and breath sounds normal. No respiratory distress. He exhibits no tenderness.  Abdominal: Soft. Normal appearance and bowel sounds are normal. There is no hepatosplenomegaly. There is no tenderness. There is no rebound, no guarding,  no tenderness at McBurney's point and negative Murphy's sign. No hernia.  Musculoskeletal: Normal range of motion. He exhibits edema.  Trace edema, right more than left  Neurological: He is alert and oriented to person, place, and time. He has normal strength. No cranial nerve deficit or sensory deficit. Coordination normal. GCS eye subscore is 4. GCS verbal subscore is 5. GCS motor subscore is 6.  Skin: Skin is warm, dry and intact. No rash noted. No cyanosis.  Psychiatric: He has a normal mood and affect. His speech is normal and behavior is normal. Thought content normal.    ED Course  Procedures (including critical care time)  EKG:  Date: 12/16/2012  Rate: 70  Rhythm: normal sinus rhythm  QRS Axis: normal  Intervals: normal  ST/T Wave abnormalities: nonspecific ST/T changes  Conduction Disutrbances:right bundle branch block  Narrative Interpretation:   Old EKG Reviewed: unchanged    DIAGNOSTIC STUDIES: Oxygen Saturation is 97% on room air, normal by my interpretation.    COORDINATION OF CARE: 2:18 PM-Discussed treatment plan which includes EKG, CXR and labs with pt at bedside and pt agreed to plan.    Labs Review Labs Reviewed  CBC WITH DIFFERENTIAL - Abnormal; Notable for the following:    Eosinophils Relative 8 (*)    All other components within normal limits  COMPREHENSIVE METABOLIC PANEL - Abnormal; Notable for the following:    Glucose, Bld 106 (*)    Creatinine, Ser 1.39 (*)    Albumin 3.3 (*)    GFR calc non Af Amer 43 (*)    GFR calc Af Amer 49 (*)    All other components within normal limits  PRO B NATRIURETIC PEPTIDE - Abnormal; Notable for the following:    Pro B Natriuretic peptide (BNP) 639.7 (*)    All other components within normal limits  TROPONIN I  PROTIME-INR    Imaging Review Dg Chest 2 View  12/16/2012   CLINICAL DATA:  Cough and shortness of breath, chest pain  EXAM: CHEST  2 VIEW  COMPARISON:  12/17/2011 at [facility]   FINDINGS: The heart size and mediastinal contours are within normal limits. Both lungs are clear with the exception of minimal linear left lower lobe scarring or atelectasis. The visualized skeletal structures are unremarkable. IVC filter partially visualized. Lumbar spine degenerative change are noted.  IMPRESSION: No active cardiopulmonary disease.   Electronically Signed   By: Christiana Pellant M.D.   On: 12/16/2012 15:20   Ct Head Wo Contrast  12/16/2012   CLINICAL DATA:  Dizzy for several days, no known injury  EXAM: CT HEAD WITHOUT CONTRAST  TECHNIQUE: Contiguous axial images were obtained from the base of the skull through the vertex without intravenous contrast.  COMPARISON:  01/03/2010  FINDINGS: Chronic atrophy and low attenuation in the deep white matter. Progressive low attenuation in the posterior parietal lobes white matter above the level of the ventricles, which could represent the results of subacute to chronic infarction or progressive chronic involutional change. No hydrocephalus. No hemorrhage or there is an air-fluid level in the right sphenoid sinus. This was not present previously.  IMPRESSION: Acute on chronic right sphenoid  sinusitis. Chronic involutional change otherwise.   Electronically Signed   By: Esperanza Heir M.D.   On: 12/16/2012 15:45    MDM  Diagnosis: 1. Chest pain 2. Sinusitis  Patient presents to the ER for evaluation of multiple complaints. Patient complains of onset of shortness of breath, dizziness and epigastric discomfort today. Patient reports that in the last few days he has noticed decreased ability to walk, as walking short distances causes him to become very short of breath. This is new for him. Patient is not experiencing any abdominal or chest pain at rest. He reports nausea, has had vomiting each of the last couple of nights. He has been constipated which is a chronic problem for him. Patient notices dizziness, has not had any headaches associated with it.  CT head shows evidence of sinusitis which might explain the patient's dizziness. Patient underwent cardiac evaluation by EKG (nonspecific) and troponin (negative). CT angiography performed because the patient has a history of PE. No evidence of PE seen.  Because of the patient's previous cardiac history with stenting and his epigastric discomfort associated with significant dyspnea on exertion, patient will require hospitalization for further management.   I personally performed the services described in this documentation, which was scribed in my presence. The recorded information has been reviewed and is accurate.    Gilda Crease, MD 12/16/12 1901

## 2012-12-16 NOTE — ED Notes (Signed)
Pt has been sick several days, today had sob and dizziness and pressure in his chest.  Alert, skin warm and dry.

## 2012-12-16 NOTE — ED Notes (Signed)
Pt complain of both eyes itching. States the left eye feels like it is scratched. EDP aware

## 2012-12-17 DIAGNOSIS — I4891 Unspecified atrial fibrillation: Secondary | ICD-10-CM

## 2012-12-17 DIAGNOSIS — R0602 Shortness of breath: Secondary | ICD-10-CM | POA: Diagnosis present

## 2012-12-17 LAB — TROPONIN I
Troponin I: <0.3 ng/mL (ref <0.30)
Troponin I: <0.3 ng/mL (ref <0.30)

## 2012-12-17 LAB — CBC
HCT: 41 % (ref 39.0–52.0)
Hemoglobin: 13.7 g/dL (ref 13.0–17.0)
MCH: 31.9 pg (ref 26.0–34.0)
MCHC: 33.4 g/dL (ref 30.0–36.0)
MCV: 95.6 fL (ref 78.0–100.0)
Platelets: 193 10*3/uL (ref 150–400)
RDW: 14.3 % (ref 11.5–15.5)

## 2012-12-17 LAB — COMPREHENSIVE METABOLIC PANEL
Albumin: 2.9 g/dL — ABNORMAL LOW (ref 3.5–5.2)
Alkaline Phosphatase: 80 U/L (ref 39–117)
BUN: 18 mg/dL (ref 6–23)
CO2: 25 mEq/L (ref 19–32)
Calcium: 9.3 mg/dL (ref 8.4–10.5)
Creatinine, Ser: 1.38 mg/dL — ABNORMAL HIGH (ref 0.50–1.35)
GFR calc Af Amer: 50 mL/min — ABNORMAL LOW (ref >90)
Glucose, Bld: 86 mg/dL (ref 70–99)
Potassium: 3.7 mEq/L (ref 3.5–5.1)
Total Protein: 7.1 g/dL (ref 6.0–8.3)

## 2012-12-17 LAB — URINALYSIS, ROUTINE W REFLEX MICROSCOPIC
Bilirubin Urine: NEGATIVE
Glucose, UA: NEGATIVE mg/dL
Ketones, ur: NEGATIVE mg/dL
Leukocytes, UA: NEGATIVE
Protein, ur: NEGATIVE mg/dL
pH: 7 (ref 5.0–8.0)

## 2012-12-17 LAB — HEMOGLOBIN A1C: Hgb A1c MFr Bld: 5.8 % — ABNORMAL HIGH (ref <5.7)

## 2012-12-17 LAB — TSH: TSH: 3.526 u[IU]/mL (ref 0.350–4.500)

## 2012-12-17 LAB — MAGNESIUM: Magnesium: 2.2 mg/dL (ref 1.5–2.5)

## 2012-12-17 MED ORDER — AMLODIPINE BESYLATE 10 MG PO TABS: 10.0000 mg | ORAL_TABLET | Freq: Every day | ORAL | Status: DC

## 2012-12-17 MED ORDER — RIVAROXABAN 15 MG PO TABS: 15.0000 mg | ORAL_TABLET | Freq: Every day | ORAL | Status: DC

## 2012-12-17 MED ORDER — ALBUTEROL SULFATE HFA 108 (90 BASE) MCG/ACT IN AERS: 2.0000 | INHALATION_SPRAY | Freq: Four times a day (QID) | RESPIRATORY_TRACT | Status: DC | PRN

## 2012-12-17 MED ORDER — LEVOFLOXACIN 500 MG PO TABS: 500.0000 mg | ORAL_TABLET | Freq: Every day | ORAL | Status: DC

## 2012-12-17 MED ORDER — NITROGLYCERIN 0.4 MG SL SUBL: 0.4000 mg | SUBLINGUAL_TABLET | SUBLINGUAL | Status: DC | PRN

## 2012-12-17 MED ORDER — FLEET ENEMA 7-19 GM/118ML RE ENEM: 1.0000 | ENEMA | Freq: Once | RECTAL | Status: DC | PRN

## 2012-12-17 MED ORDER — ACETAMINOPHEN 325 MG PO TABS
650.0000 mg | ORAL_TABLET | Freq: Four times a day (QID) | ORAL | Status: DC | PRN
  Administered 2012-12-17: 650 mg via ORAL
  Filled 2012-12-17: qty 2

## 2012-12-17 MED ORDER — AMLODIPINE BESYLATE 5 MG PO TABS
5.0000 mg | ORAL_TABLET | Freq: Every day | ORAL | Status: DC
  Administered 2012-12-17: 5 mg via ORAL
  Filled 2012-12-17: qty 1

## 2012-12-17 MED ORDER — POTASSIUM CHLORIDE IN NACL 20-0.9 MEQ/L-% IV SOLN
INTRAVENOUS | Status: DC
Start: 2012-12-17 — End: 2012-12-17
  Administered 2012-12-17: 04:00:00 via INTRAVENOUS

## 2012-12-17 MED ORDER — AMLODIPINE BESYLATE 5 MG PO TABS: 5.0000 mg | ORAL_TABLET | Freq: Every day | ORAL | Status: DC

## 2012-12-17 MED ORDER — POLYETHYLENE GLYCOL 3350 17 GM/SCOOP PO POWD
17.0000 g | Freq: Every day | ORAL | Status: DC
  Filled 2012-12-17: qty 255

## 2012-12-17 MED ORDER — ONDANSETRON HCL 4 MG/2ML IJ SOLN: 4.0000 mg | Freq: Four times a day (QID) | INTRAMUSCULAR | Status: DC | PRN

## 2012-12-17 MED ORDER — AMLODIPINE BESYLATE 5 MG PO TABS: 10.0000 mg | ORAL_TABLET | Freq: Every day | ORAL | Status: DC

## 2012-12-17 MED ORDER — ACETAMINOPHEN 650 MG RE SUPP: 650.0000 mg | Freq: Four times a day (QID) | RECTAL | Status: DC | PRN

## 2012-12-17 MED ORDER — ONDANSETRON HCL 4 MG PO TABS
4.0000 mg | ORAL_TABLET | Freq: Four times a day (QID) | ORAL | Status: DC | PRN
  Administered 2012-12-17: 4 mg via ORAL
  Filled 2012-12-17: qty 1

## 2012-12-17 MED ORDER — DOCUSATE SODIUM 100 MG PO CAPS: 100.0000 mg | ORAL_CAPSULE | Freq: Every day | ORAL | Status: DC

## 2012-12-17 MED ORDER — DULOXETINE HCL 30 MG PO CPEP: 30.0000 mg | ORAL_CAPSULE | Freq: Every day | ORAL | Status: DC

## 2012-12-17 MED ORDER — TAMSULOSIN HCL 0.4 MG PO CAPS
0.4000 mg | ORAL_CAPSULE | Freq: Every day | ORAL | Status: DC
  Administered 2012-12-17: 0.4 mg via ORAL
  Filled 2012-12-17: qty 1

## 2012-12-17 MED ORDER — MECLIZINE HCL 12.5 MG PO TABS
12.5000 mg | ORAL_TABLET | Freq: Three times a day (TID) | ORAL | Status: DC | PRN
Start: 2012-12-17 — End: 2012-12-17

## 2012-12-17 MED ORDER — PANTOPRAZOLE SODIUM 40 MG PO TBEC
40.0000 mg | DELAYED_RELEASE_TABLET | Freq: Every day | ORAL | Status: DC
  Administered 2012-12-17: 40 mg via ORAL
  Filled 2012-12-17: qty 1

## 2012-12-17 MED ORDER — POLYETHYLENE GLYCOL 3350 17 G PO PACK: 17.0000 g | PACK | Freq: Every day | ORAL | Status: DC

## 2012-12-17 MED ORDER — SODIUM CHLORIDE 0.9 % IJ SOLN
3.0000 mL | Freq: Two times a day (BID) | INTRAMUSCULAR | Status: DC
  Administered 2012-12-17 (×2): 3 mL via INTRAVENOUS

## 2012-12-17 MED ORDER — LATANOPROST 0.005 % OP SOLN
1.0000 [drp] | Freq: Every day | OPHTHALMIC | Status: DC
  Filled 2012-12-17: qty 2.5

## 2012-12-17 MED ORDER — SORBITOL 70 % SOLN: 30.0000 mL | Freq: Every day | Status: DC | PRN

## 2012-12-17 MED ORDER — ALPRAZOLAM 0.25 MG PO TABS: 0.2500 mg | ORAL_TABLET | Freq: Two times a day (BID) | ORAL | Status: DC | PRN

## 2012-12-17 NOTE — H&P (Signed)
[facility] History and Physical  KIING DEAKIN  WUJ:[id number]  DOB: 08/04/1919   DOA: 12/16/2012   PCP:   Leo Grosser, MD   Chief Complaint:  Shortness of breath since today  HPI: Jason [name] is a 79 y.o. male.   Elderly Caucasian gentleman who is extremely hard of hearing brought in because of new onset of shortness of breath with exertion. There is also a history that he has vomited a few times over the past few days. He was evaluated in the emergency room. No acute problems well but because of his age and multiple comorbidities request for observation was made to rule out acute cardiac injury  Currently family is left and history is very difficult because patient is extremely hard of   Rewiew of Systems:  Unable to attain due to extreme hard of hearing    Past Medical History  Diagnosis Date  . PVD (peripheral vascular disease)   . CAD (coronary artery disease)     a. 12/2005 - PCI to OM  . Hyperlipidemia   . Cerebrovascular accident   . DVT femoral (deep venous thrombosis) with thrombophlebitis   . GERD (gastroesophageal reflux disease)   . Edema   . Anxiety   . Vertigo   . IBS (irritable bowel syndrome)   . Duodenitis   . Gastritis   . Esophageal stricture   . Cellulitis   . Pulmonary embolism   . Prostatic hypertrophy   . Frequent falls   . Subdural hematoma   . PTSD (post-traumatic stress disorder)   . Vertigo   . TIA (transient ischemic attack)     a. 01/2006 and 05/2006.   [name] Hiatal hernia     periferal neuropathy  . Hx of echocardiogram     Echo 9/13:  Mild LVH, EF 60-65%, Gr 1 diast dysfn, mild AI, mild BAE    Past Surgical History  Procedure Laterality Date  . Hand surgery    . Angioplasty    . Carotid endarterectomy      Left  . Bare-metal stenting      left circumflex  . Bilateral cataract surgery      Medications:  HOME MEDS: Prior to Admission medications   Medication Sig Start Date End Date Taking? Authorizing Provider   ALPRAZolam (XANAX) 0.25 MG tablet Take 0.25 mg by mouth 2 (two) times daily as needed for sleep or anxiety.   Yes Historical Provider, MD  cholecalciferol (VITAMIN D) 1000 UNITS tablet Take 1,000 Units by mouth daily.   Yes Historical Provider, MD  docusate sodium (COLACE) 100 MG capsule Take 100 mg by mouth at bedtime.   Yes Historical Provider, MD  DULoxetine (CYMBALTA) 30 MG capsule Take 30 mg by mouth at bedtime.   Yes Historical Provider, MD  latanoprost (XALATAN) 0.005 % ophthalmic solution Place 1 drop into both eyes at bedtime.   Yes Historical Provider, MD  meclizine (ANTIVERT) 12.5 MG tablet Take 1 tablet (12.5 mg total) by mouth 3 (three) times daily as needed for dizziness. For dizziness. 12/12/12  Yes Donita Brooks, MD  nitroGLYCERIN (NITROSTAT) 0.4 MG SL tablet Place 0.4 mg under the tongue every 5 (five) minutes as needed. Chest pain   Yes Historical Provider, MD  omeprazole (PRILOSEC) 20 MG capsule Take 20 mg by mouth 2 (two) times daily before a meal.    Yes Historical Provider, MD  ondansetron (ZOFRAN) 4 MG tablet Take 4 mg by mouth every 8 (eight) hours as needed for  nausea.   Yes Historical Provider, MD  polyethylene glycol powder (MIRALAX) powder Take 17 g by mouth at bedtime. Constipation    Yes Historical Provider, MD  Rivaroxaban (XARELTO) 15 MG TABS tablet Take 15 mg by mouth daily with supper.   Yes Historical Provider, MD  Tamsulosin HCl (FLOMAX) 0.4 MG CAPS Take 0.4 mg by mouth daily.    Yes Historical Provider, MD  tiZANidine (ZANAFLEX) 2 MG tablet Take 2 mg by mouth every 6 (six) hours as needed (for muscle relaxant).   Yes Historical Provider, MD  vitamin C (ASCORBIC ACID) 500 MG tablet Take 500 mg by mouth daily.   Yes Historical Provider, MD  zolpidem (AMBIEN) 5 MG tablet Take 5 mg by mouth at bedtime as needed. For insomnia.   Yes Historical Provider, MD  azithromycin (ZITHROMAX) 250 MG tablet 2 tabs poqday 1, 1 poqday 2-5 12/12/12   Donita Brooks, MD   promethazine (PHENERGAN) 25 MG tablet Take 1 tablet (25 mg total) by mouth every 6 (six) hours as needed. nausea 12/12/12   Donita Brooks, MD     Allergies:  Allergies  Allergen Reactions  . Amoxicillin-Pot Clavulanate Nausea And Vomiting    daughter states he can tolerate Amoxicillin ok  . Fludrocortisone Acetate Other (See Comments)    unknown  . Scopace [Scopolamine] Other (See Comments)    *patch Unknown reaction    Social History:   reports that he has never smoked. He has never used smokeless tobacco. He reports that he does not drink alcohol or use illicit drugs.  Family History: Family History  Problem Relation Age of Onset  . Colon cancer Neg Hx   . Coronary artery disease Brother   . Heart attack Father      Physical Exam: Filed Vitals:   12/16/12 1828 12/16/12 2000 12/16/12 2025 12/16/12 2223  BP: 184/74 183/79 183/79 173/70  Pulse: 73 80 77 65  Temp:    97.9 F (36.6 C)  TempSrc:    Oral  Resp: 16 22  20   SpO2: 95%   95%   Blood pressure 173/70, pulse 65, temperature 97.9 F (36.6 C), temperature source Oral, resp. rate 20, SpO2 95.00%. There is no weight on file to calculate BMI.   GEN:  Pleasant elderly gentleman lying bed resting; looks extremely healthy for his age; cooperative with exam PSYCH:  alert and oriented ;  affect is appropriate. HEENT: Mucous membranes pink and anicteric; PERRLA; EOM intact;  Breasts:: Not examined CHEST WALL: No tenderness CHEST: Normal respiration, clear to auscultation bilaterally HEART: Regular rate and rhythm; no murmurs rubs or gallops ABDOMEN: Obese, soft non-tender; no masses, no organomegaly, normal abdominal bowel sounds; no pannus; no intertriginous candida. Rectal Exam: Not done EXTREMITIES:  age-appropriate arthropathy of the hands and knees; no edema; no ulcerations. Genitalia: not examined PULSES: 2+ and symmetric SKIN: Normal hydration no rash or ulceration CNS: Cranial nerves 2-12 grossly intact  no focal lateralizing neurologic deficit   Labs on Admission:  Basic Metabolic Panel:  Recent Labs Lab 12/16/12 1428  NA 138  K 4.1  CL 101  CO2 26  GLUCOSE 106*  BUN 19  CREATININE 1.39*  CALCIUM 9.5   Liver Function Tests:  Recent Labs Lab 12/16/12 1428  AST 16  ALT 13  ALKPHOS 89  BILITOT 0.4  PROT 7.8  ALBUMIN 3.3*   No results found for this basename: LIPASE, AMYLASE,  in the last 168 hours No results found for this basename: AMMONIA,  in the last 168 hours CBC:  Recent Labs Lab 12/16/12 1428  WBC 6.8  NEUTROABS 3.9  HGB 14.3  HCT 42.2  MCV 96.3  PLT 197   Cardiac Enzymes:  Recent Labs Lab 12/16/12 1428  TROPONINI <0.30   BNP: No components found with this basename: POCBNP,  D-dimer: No components found with this basename: D-DIMER,  CBG: No results found for this basename: GLUCAP,  in the last 168 hours  Radiological Exams on Admission: Dg Chest 2 View  12/16/2012   CLINICAL DATA:  Cough and shortness of breath, chest pain  EXAM: CHEST  2 VIEW  COMPARISON:  12/17/2011 at [facility]  FINDINGS: The heart size and mediastinal contours are within normal limits. Both lungs are clear with the exception of minimal linear left lower lobe scarring or atelectasis. The visualized skeletal structures are unremarkable. IVC filter partially visualized. Lumbar spine degenerative change are noted.  IMPRESSION: No active cardiopulmonary disease.   Electronically Signed   By: Christiana Pellant M.D.   On: 12/16/2012 15:20   Ct Head Wo Contrast  12/16/2012   CLINICAL DATA:  Dizzy for several days, no known injury  EXAM: CT HEAD WITHOUT CONTRAST  TECHNIQUE: Contiguous axial images were obtained from the base of the skull through the vertex without intravenous contrast.  COMPARISON:  01/03/2010  FINDINGS: Chronic atrophy and low attenuation in the deep white matter. Progressive low attenuation in the posterior parietal lobes white matter above the level of  the ventricles, which could represent the results of subacute to chronic infarction or progressive chronic involutional change. No hydrocephalus. No hemorrhage or there is an air-fluid level in the right sphenoid sinus. This was not present previously.  IMPRESSION: Acute on chronic right sphenoid sinusitis. Chronic involutional change otherwise.   Electronically Signed   By: Esperanza Heir M.D.   On: 12/16/2012 15:45   Ct Angio Chest Pe W/cm &/or Wo Cm  12/16/2012   CLINICAL DATA:  DVT, chest pain, history of pulmonary embolism  EXAM: CT ANGIOGRAPHY CHEST WITH CONTRAST  TECHNIQUE: Multidetector CT imaging of the chest was performed using the standard protocol during bolus administration of intravenous contrast. Multiplanar CT image reconstructions including MIPs were obtained to evaluate the vascular anatomy.  CONTRAST:  OMNIPAQUE IOHEXOL 350 MG/ML SOLN  COMPARISON:  Chest radiograph performed today  FINDINGS: 3 cm right thyroid nodule. Thoracic aorta shows no dissection or dilatation. There is calcification of the thoracic aorta and of the coronary arteries. No filling defects in the pulmonary arterial system. Lungs are clear. No pleural effusions. Small hiatal hernia. Scans through the upper abdomen negative.  Review of the MIP images confirms the above findings.  IMPRESSION: 1. No pulmonary embolism. No acute cardiopulmonary process. 2. Thyroid nodule, recommend nonemergent correlation with ultrasound of the thyroid.   Electronically Signed   By: Esperanza Heir M.D.   On: 12/16/2012 18:51      Assessment/Plan  Active Problems:   PAF (paroxysmal atrial fibrillation), currently in sinus rhythm   SOB (shortness of breath)  multiple chronic medical problem   PLAN: Observe overnight to rule out acute cardiac injury; he does have a history of mild diastolic dysfunction, but is slurring no distress while lying flat therefore will not diurese   Other plans as per orders.  Code Status: Full  code     Latanja Lehenbauer Nocturnist [facility] Pager [phone]   12/17/2012, 3:15 AM

## 2012-12-17 NOTE — Evaluation (Signed)
Physical Therapy Evaluation Patient Details Name: Jason [name] MRN: [id number] DOB: 11/04/1919 Today's Date: 12/17/2012 Time: 4098-1191 PT Time Calculation (min): 41 min  PT Assessment / Plan / Recommendation History of Present Illness  Pt is admitted with Afib.  He is a resident of [facility] and has been independent with ADLs, ambulating with a walker (somewhat limited by bilateral peripheral neuropathy).  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  Clinical Impression   Pt was seen for evaluation and found to be at prior functional level.  He should be able to transition to [facility] with no change in status.  PT Assessment  Patent does not need any further PT services    Follow Up Recommendations  No PT follow up    Does the patient have the potential to tolerate intense rehabilitation      Barriers to Discharge        Equipment Recommendations  None recommended by PT    Recommendations for Other Services     Frequency      Precautions / Restrictions Precautions Precautions: Fall Restrictions Weight Bearing Restrictions: No   Pertinent Vitals/Pain       Mobility  Bed Mobility Bed Mobility: Supine to Sit Supine to Sit: 7: Independent;HOB flat Transfers Transfers: Sit to Stand;Stand to Sit Sit to Stand: 6: Modified independent (Device/Increase time);From bed;Without upper extremity assist Stand to Sit: 6: Modified independent (Device/Increase time);To chair/3-in-1 Ambulation/Gait Ambulation/Gait Assistance: 6: Modified independent (Device/Increase time) Ambulation Distance (Feet): 100 Feet Assistive device: Rolling walker Gait Pattern: Trunk flexed;Right flexed knee in stance;Left flexed knee in stance;Decreased hip/knee flexion - right;Decreased hip/knee flexion - left Gait velocity: WNL General Gait Details: pt has very limited cervical rotation which affects gait balance Stairs: No Wheelchair Mobility Wheelchair Mobility: No    Exercises      PT Diagnosis:    PT Problem List:   PT Treatment Interventions:       PT Goals(Current goals can be found in the care plan section) Acute Rehab PT Goals PT Goal Formulation: No goals set, d/c therapy Time For Goal Achievement: 12/31/12  Visit Information  Last PT Received On: 12/17/12 Reason Eval/Treat Not Completed: Pain limiting ability to participate History of Present Illness: Pt is admitted with Afib.  He is a resident of [facility] and has been independent with ADLs, ambulating with a walker (somewhat limited by bilateral peripheral neuropathy).  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       Prior Functioning  Home Living Family/patient expects to be discharged to:: Assisted living Home Equipment: Walker - 4 wheels Prior Function Level of Independence: Independent with assistive device(s) Communication Communication: HOH Dominant Hand: Right    Cognition  Cognition Arousal/Alertness: Awake/alert Behavior During Therapy: WFL for tasks assessed/performed Overall Cognitive Status: Within Functional Limits for tasks assessed    Extremity/Trunk Assessment Lower Extremity Assessment Lower Extremity Assessment: Overall WFL for tasks assessed (peripheral neuropathty) Cervical / Trunk Assessment Cervical / Trunk Assessment: Kyphotic   Balance Balance Balance Assessed: No  End of Session PT - End of Session Equipment Utilized During Treatment: Gait belt Activity Tolerance: Patient tolerated treatment well Patient left: in chair;with call bell/phone within reach;with chair alarm set  GP Functional Assessment Tool Used: clinical judgement Functional Limitation: Mobility: Walking and moving around Mobility: Walking and Moving Around Current Status (Y7829): 0 percent impaired, limited or restricted Mobility: Walking and Moving Around Goal Status (F6213): 0 percent impaired, limited  or restricted Mobility: Walking and Moving Around Discharge Status [phone]  0 percent impaired, limited or restricted   [name], [name] 12/17/2012, 10:44 AM

## 2012-12-17 NOTE — Progress Notes (Signed)
Utilization Review Complete  

## 2012-12-17 NOTE — Clinical Social Work Psychosocial (Signed)
Clinical Social Work Department BRIEF PSYCHOSOCIAL ASSESSMENT 12/17/2012  Patient:  Jason [name], Jason [name]     Account Number:  [id number]     Admit date:  12/16/2012  Clinical Social Worker:  Nancie Neas  Date/Time:  12/17/2012 01:35 PM  Referred by:  CSW  Date Referred:  12/17/2012 Referred for  ALF Placement   Other Referral:   Interview type:  Family Other interview type:   daughter- Jason [name]    PSYCHOSOCIAL DATA Living Status:  FACILITY Admitted from facility:  OTHER Level of care:  Assisted Living Primary support name:  Jason [name] Primary support relationship to patient:  CHILD, ADULT Degree of support available:   supportive    CURRENT CONCERNS Current Concerns  Post-Acute Placement   Other Concerns:    SOCIAL WORK ASSESSMENT / PLAN CSW met with pt's daughter, Jason [name] at bedside. Pt sleeping for most of assessment. Pt has been a resident at [facility] since June. He is primarily independent, just needs assistance with medications. Family appears to be involved and supportive. Yesterday, pt was visiting his daughter who lives in [place] when he began to experience SOB. He was brought to ED. Pt is d/c today back to ALF. CSW spoke with [place] at facility who confirms above information and reports ok to return today. Agreeable to no FL2 due to <24 hour observation. Pt's family will provide transportation. D/C summary and AVS in packet for pt to take with him.   Assessment/plan status:  No Further Intervention Required Other assessment/ plan:   Information/referral to community resources:   [facility]    PATIENT'S/FAMILY'S RESPONSE TO PLAN OF CARE: Pt and daughter aware and agreeable to d/c back to [facility] today. CSW will sign off.       Derenda Fennel, [place] [phone]

## 2012-12-17 NOTE — Discharge Summary (Signed)
Physician Discharge Summary  Jason [name] QIH:[id number] DOB: March 26, 1919 DOA: 12/16/2012  PCP: Leo Grosser, MD  Admit date: 12/16/2012 Discharge date: 12/17/2012  Time spent: >35 minutes  Recommendations for Outpatient Follow-up:  F/u with PCP in 2 weeks  Discharge Diagnoses:  Active Problems:   PAF (paroxysmal atrial fibrillation)   SOB (shortness of breath)   Discharge Condition: stable   Diet recommendation: heart healthy   Filed Weights   12/17/12 0437  Weight: 83.7 kg (184 lb 8.4 oz)    History of present illness/Hospital Course:    79 y/o male with PMH of CVA, PE/DVT on rivoroxaban, PTSD, TIA, anxiety presented with SOB  There is also a history that he has vomited a few times over the past few days. He was evaluated in the emergency room. No acute problems well but because of his age and multiple comorbidities request for observation was made to rule out acute cardiac injury -He had CTA which was negative for PE; troponins were negative, ECG no acute changes; he denies acute chest pain, or exertional chest pain;  ? Underlying copd; recommended outpatient PFT, use albuterol prn  -he looks clinically euvolemic no s/s of acute CHF, although mild elevated bnp; no diuretics started at this time   Patient found to have uncontrolled HTN, not on any meds at  Home;  -started on amlodipine, with further outpatient titration;  -recommended outpatient cardiology follow up in 1-2 weeks   Patient also found to have acute sinusitis; afebrile; started on atx   Procedures:  CT (i.e. Studies not automatically included, echos, thoracentesis, etc; not x-rays)  Consultations:  none  Discharge Exam: Filed Vitals:   12/17/12 0944  BP: 186/92  Pulse: 67  Temp: 98.1 F (36.7 C)  Resp: 20    General: alert Cardiovascular: s1,s2 rrr Respiratory: cta BL   Discharge Instructions  Discharge Orders   Future Orders Complete By Expires   Diet - low sodium heart healthy  As  directed    Discharge instructions  As directed    Comments:     Follow up with Primary care doctor in 1 week   Increase activity slowly  As directed        Medication List         albuterol 108 (90 BASE) MCG/ACT inhaler  Commonly known as:  PROVENTIL HFA;VENTOLIN HFA  Inhale 2 puffs into the lungs every 6 (six) hours as needed for wheezing.     ALPRAZolam 0.25 MG tablet  Commonly known as:  XANAX  Take 0.25 mg by mouth 2 (two) times daily as needed for sleep or anxiety.     amLODipine 10 MG tablet  Commonly known as:  NORVASC  Take 1 tablet (10 mg total) by mouth daily.  Start taking on:  12/18/2012     azithromycin 250 MG tablet  Commonly known as:  ZITHROMAX  2 tabs poqday 1, 1 poqday 2-5     cholecalciferol 1000 UNITS tablet  Commonly known as:  VITAMIN D  Take 1,000 Units by mouth daily.     docusate sodium 100 MG capsule  Commonly known as:  COLACE  Take 100 mg by mouth at bedtime.     DULoxetine 30 MG capsule  Commonly known as:  CYMBALTA  Take 30 mg by mouth at bedtime.     FLOMAX 0.4 MG Caps capsule  Generic drug:  tamsulosin  Take 0.4 mg by mouth daily.     latanoprost 0.005 % ophthalmic solution  Commonly  known as:  XALATAN  Place 1 drop into both eyes at bedtime.     meclizine 12.5 MG tablet  Commonly known as:  ANTIVERT  Take 1 tablet (12.5 mg total) by mouth 3 (three) times daily as needed for dizziness. For dizziness.     MIRALAX powder  Generic drug:  polyethylene glycol powder  - Take 17 g by mouth at bedtime. Constipation  -      nitroGLYCERIN 0.4 MG SL tablet  Commonly known as:  NITROSTAT  Place 0.4 mg under the tongue every 5 (five) minutes as needed. Chest pain     omeprazole 20 MG capsule  Commonly known as:  PRILOSEC  Take 20 mg by mouth 2 (two) times daily before a meal.     ondansetron 4 MG tablet  Commonly known as:  ZOFRAN  Take 4 mg by mouth every 8 (eight) hours as needed for nausea.     promethazine 25 MG tablet   Commonly known as:  PHENERGAN  Take 1 tablet (25 mg total) by mouth every 6 (six) hours as needed. nausea     Rivaroxaban 15 MG Tabs tablet  Commonly known as:  XARELTO  Take 15 mg by mouth daily with supper.     tiZANidine 2 MG tablet  Commonly known as:  ZANAFLEX  Take 2 mg by mouth every 6 (six) hours as needed (for muscle relaxant).     vitamin C 500 MG tablet  Commonly known as:  ASCORBIC ACID  Take 500 mg by mouth daily.     zolpidem 5 MG tablet  Commonly known as:  AMBIEN  Take 5 mg by mouth at bedtime as needed. For insomnia.       Allergies  Allergen Reactions  . Amoxicillin-Pot Clavulanate Nausea And Vomiting    daughter states he can tolerate Amoxicillin ok  . Fludrocortisone Acetate Other (See Comments)    unknown  . Scopace [Scopolamine] Other (See Comments)    *patch Unknown reaction       Follow-up Information   Follow up with [facility] TOM, MD In 1 week.   Specialty:  Family Medicine   Contact information:   [address] [place] [address] [phone]        The results of significant diagnostics from this hospitalization (including imaging, microbiology, ancillary and laboratory) are listed below for reference.    Significant Diagnostic Studies: Dg Chest 2 View  12/16/2012   CLINICAL DATA:  Cough and shortness of breath, chest pain  EXAM: CHEST  2 VIEW  COMPARISON:  12/17/2011 at [facility]  FINDINGS: The heart size and mediastinal contours are within normal limits. Both lungs are clear with the exception of minimal linear left lower lobe scarring or atelectasis. The visualized skeletal structures are unremarkable. IVC filter partially visualized. Lumbar spine degenerative change are noted.  IMPRESSION: No active cardiopulmonary disease.   Electronically Signed   By: Christiana Pellant M.D.   On: 12/16/2012 15:20   Ct Head Wo Contrast  12/16/2012   CLINICAL DATA:  Dizzy for several days, no known injury  EXAM: CT  HEAD WITHOUT CONTRAST  TECHNIQUE: Contiguous axial images were obtained from the base of the skull through the vertex without intravenous contrast.  COMPARISON:  01/03/2010  FINDINGS: Chronic atrophy and low attenuation in the deep white matter. Progressive low attenuation in the posterior parietal lobes white matter above the level of the ventricles, which could represent the results of subacute to chronic  infarction or progressive chronic involutional change. No hydrocephalus. No hemorrhage or there is an air-fluid level in the right sphenoid sinus. This was not present previously.  IMPRESSION: Acute on chronic right sphenoid sinusitis. Chronic involutional change otherwise.   Electronically Signed   By: Esperanza Heir M.D.   On: 12/16/2012 15:45   Ct Angio Chest Pe W/cm &/or Wo Cm  12/16/2012   CLINICAL DATA:  DVT, chest pain, history of pulmonary embolism  EXAM: CT ANGIOGRAPHY CHEST WITH CONTRAST  TECHNIQUE: Multidetector CT imaging of the chest was performed using the standard protocol during bolus administration of intravenous contrast. Multiplanar CT image reconstructions including MIPs were obtained to evaluate the vascular anatomy.  CONTRAST:  OMNIPAQUE IOHEXOL 350 MG/ML SOLN  COMPARISON:  Chest radiograph performed today  FINDINGS: 3 cm right thyroid nodule. Thoracic aorta shows no dissection or dilatation. There is calcification of the thoracic aorta and of the coronary arteries. No filling defects in the pulmonary arterial system. Lungs are clear. No pleural effusions. Small hiatal hernia. Scans through the upper abdomen negative.  Review of the MIP images confirms the above findings.  IMPRESSION: 1. No pulmonary embolism. No acute cardiopulmonary process. 2. Thyroid nodule, recommend nonemergent correlation with ultrasound of the thyroid.   Electronically Signed   By: Esperanza Heir M.D.   On: 12/16/2012 18:51    Microbiology: No results found for this or any previous visit (from the past  240 hour(s)).   Labs: Basic Metabolic Panel:  Recent Labs Lab 12/16/12 1428 12/17/12 0414  NA 138 140  K 4.1 3.7  CL 101 105  CO2 26 25  GLUCOSE 106* 86  BUN 19 18  CREATININE 1.39* 1.38*  CALCIUM 9.5 9.3  MG  --  2.2   Liver Function Tests:  Recent Labs Lab 12/16/12 1428 12/17/12 0414  AST 16 12  ALT 13 9  ALKPHOS 89 80  BILITOT 0.4 0.5  PROT 7.8 7.1  ALBUMIN 3.3* 2.9*   No results found for this basename: LIPASE, AMYLASE,  in the last 168 hours No results found for this basename: AMMONIA,  in the last 168 hours CBC:  Recent Labs Lab 12/16/12 1428 12/17/12 0414  WBC 6.8 6.6  NEUTROABS 3.9  --   HGB 14.3 13.7  HCT 42.2 41.0  MCV 96.3 95.6  PLT 197 193   Cardiac Enzymes:  Recent Labs Lab 12/16/12 1428 12/17/12 0414 12/17/12 0944  TROPONINI <0.30 <0.30 <0.30   BNP: BNP (last 3 results)  Recent Labs  12/16/12 1428  PROBNP 639.7*   CBG: No results found for this basename: GLUCAP,  in the last 168 hours     Signed:  Jonette Mate N  [facility] 12/17/2012, 11:29 AM

## 2012-12-17 NOTE — Progress Notes (Signed)
Pt discharged home today per Dr. York Spaniel. Pt's IV site D/C'd and WNL. Pt's VS stable at this time. Pt and daughter provided with home medication list, discharge instructions and prescriptions. Verbalized understanding. Daughter provided with packet to give to facility once pt arrives. Verbalized understanding. Pt left floor via WC in stable condition accompanied by NT.

## 2012-12-24 ENCOUNTER — Encounter: Payer: Self-pay | Admitting: Family Medicine

## 2012-12-24 ENCOUNTER — Ambulatory Visit (INDEPENDENT_AMBULATORY_CARE_PROVIDER_SITE_OTHER): Payer: Medicare Other | Admitting: Family Medicine

## 2012-12-24 VITALS — BP 100/60 | HR 76 | Temp 96.7°F | Resp 16 | Ht 70.0 in | Wt 187.0 lb

## 2012-12-24 DIAGNOSIS — R1013 Epigastric pain: Secondary | ICD-10-CM

## 2012-12-24 NOTE — Progress Notes (Signed)
Subjective:    Patient ID: Jason [name], male    DOB: 05-Aug-1919, 79 y.o.   MRN: [id number]  HPI Patient was recently seen at the hospital for chest pain nausea and shortness of breath.  He underwent 24 hour rule out of acute coronary syndrome. A CT angiogram of the chest was negative for pulmonary embolism. He was discharged home on amlodipine due to hypertension. Since being back in a skilled nursing facility however he has been hypotensive and dizzy with a blood pressure of 100/60.  He is also complaining of swelling in both legs which has been a previous problem on amlodipine. He continues to have episodic nausea, dyspepsia, and gastroesophageal reflux. This is even in the face of taking omeprazole. In the past he has used dexilant and has had much more success in controlling his dyspepsia. He also has a history of irritable bowel syndrome/"nervous stomach". His daughter is concerned that his nausea could be related to either uncontrolled GERD or his IBS. Past Medical History  Diagnosis Date  . PVD (peripheral vascular disease)   . CAD (coronary artery disease)     a. 12/2005 - PCI to OM  . Hyperlipidemia   . Cerebrovascular accident   . DVT femoral (deep venous thrombosis) with thrombophlebitis   . GERD (gastroesophageal reflux disease)   . Edema   . Anxiety   . Vertigo   . IBS (irritable bowel syndrome)   . Duodenitis   . Gastritis   . Esophageal stricture   . Cellulitis   . Pulmonary embolism   . Prostatic hypertrophy   . Frequent falls   . Subdural hematoma   . PTSD (post-traumatic stress disorder)   . Vertigo   . TIA (transient ischemic attack)     a. 01/2006 and 05/2006.   [name] Hiatal hernia     periferal neuropathy  . Hx of echocardiogram     Echo 9/13:  Mild LVH, EF 60-65%, Gr 1 diast dysfn, mild AI, mild BAE   Current Outpatient Prescriptions on File Prior to Visit  Medication Sig Dispense Refill  . albuterol (PROVENTIL HFA;VENTOLIN HFA) 108 (90 BASE) MCG/ACT inhaler  Inhale 2 puffs into the lungs every 6 (six) hours as needed for wheezing.  1 Inhaler  2  . ALPRAZolam (XANAX) 0.25 MG tablet Take 0.25 mg by mouth 2 (two) times daily as needed for sleep or anxiety.      [name] amLODipine (NORVASC) 10 MG tablet Take 1 tablet (10 mg total) by mouth daily.  30 tablet  1  . cholecalciferol (VITAMIN D) 1000 UNITS tablet Take 1,000 Units by mouth daily.      [name] docusate sodium (COLACE) 100 MG capsule Take 100 mg by mouth at bedtime.      . DULoxetine (CYMBALTA) 30 MG capsule Take 30 mg by mouth at bedtime.      [name] latanoprost (XALATAN) 0.005 % ophthalmic solution Place 1 drop into both eyes at bedtime.      . meclizine (ANTIVERT) 12.5 MG tablet Take 1 tablet (12.5 mg total) by mouth 3 (three) times daily as needed for dizziness. For dizziness.  30 tablet  2  . nitroGLYCERIN (NITROSTAT) 0.4 MG SL tablet Place 0.4 mg under the tongue every 5 (five) minutes as needed. Chest pain      . omeprazole (PRILOSEC) 20 MG capsule Take 20 mg by mouth 2 (two) times daily before a meal.       . ondansetron (ZOFRAN) 4 MG tablet Take 4 mg  by mouth every 8 (eight) hours as needed for nausea.      . polyethylene glycol powder (MIRALAX) powder Take 17 g by mouth at bedtime. Constipation       . promethazine (PHENERGAN) 25 MG tablet Take 1 tablet (25 mg total) by mouth every 6 (six) hours as needed. nausea  30 tablet  1  . Rivaroxaban (XARELTO) 15 MG TABS tablet Take 15 mg by mouth daily with supper.      . Tamsulosin HCl (FLOMAX) 0.4 MG CAPS Take 0.4 mg by mouth daily.       [name] tiZANidine (ZANAFLEX) 2 MG tablet Take 2 mg by mouth every 6 (six) hours as needed (for muscle relaxant).      . vitamin C (ASCORBIC ACID) 500 MG tablet Take 500 mg by mouth daily.      [name] zolpidem (AMBIEN) 5 MG tablet Take 5 mg by mouth at bedtime as needed. For insomnia.       No current facility-administered medications on file prior to visit.   Allergies  Allergen Reactions  . Amoxicillin-Pot Clavulanate Nausea And  Vomiting    daughter states he can tolerate Amoxicillin ok  . Fludrocortisone Acetate Other (See Comments)    unknown  . Scopace [Scopolamine] Other (See Comments)    *patch Unknown reaction   History   Social History  . Marital Status: Married    Spouse Name: N/A    Number of Children: N/A  . Years of Education: N/A   Occupational History  . Not on file.   Social History Main Topics  . Smoking status: Never Smoker   . Smokeless tobacco: Never Used  . Alcohol Use: No     Comment: Wine occasional  . Drug Use: No  . Sexual Activity: No   Other Topics Concern  . 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       Objective:   Physical Exam  Vitals reviewed. Neck: Neck supple. No thyromegaly present.  Cardiovascular: Normal rate, regular rhythm and normal heart sounds.   No murmur heard. Pulmonary/Chest: Effort normal and breath sounds normal. No respiratory distress. He has no wheezes. He has no rales.  Abdominal: Soft. Bowel sounds are normal. He exhibits no distension. There is no tenderness. There is no rebound and no guarding.  Musculoskeletal: He exhibits edema.          Assessment & Plan:  1. Abdominal pain, epigastric With the patient likely has IBS/GERD as the cause of his nausea and abdominal discomfort. I recommended discontinuing omeprazole and starting the patient back on dexilant 60 mg by mouth daily. Also recommended Zofran 4 mg every 8 hours as needed for nausea. I like to recheck the patient in 2 weeks. His symptoms are not improving, I would proceed with a CT scan of abdomen and pelvis.  His blood pressure today is 11 the patient's symptomatic. Therefore I recommended discontinuing amlodipine. I asked the nursing facility check his blood pressure daily off amlodipine and fax me the values. His blood pressures less than  140/90 I would not treat. I believe stopping amlodipine will likely help his leg swelling.

## 2012-12-28 ENCOUNTER — Emergency Department (HOSPITAL_COMMUNITY): Payer: Medicare Other

## 2012-12-28 ENCOUNTER — Encounter (HOSPITAL_COMMUNITY): Payer: Self-pay | Admitting: Emergency Medicine

## 2012-12-28 ENCOUNTER — Emergency Department (HOSPITAL_COMMUNITY)
Admission: EM | Admit: 2012-12-28 | Discharge: 2012-12-29 | Disposition: A | Discharge status: Skilled Nursing Facility | Payer: Medicare Other | Attending: Emergency Medicine | Admitting: Emergency Medicine

## 2012-12-28 DIAGNOSIS — Z8673 Personal history of transient ischemic attack (TIA), and cerebral infarction without residual deficits: Secondary | ICD-10-CM | POA: Insufficient documentation

## 2012-12-28 DIAGNOSIS — K219 Gastro-esophageal reflux disease without esophagitis: Secondary | ICD-10-CM | POA: Insufficient documentation

## 2012-12-28 DIAGNOSIS — K625 Hemorrhage of anus and rectum: Secondary | ICD-10-CM | POA: Insufficient documentation

## 2012-12-28 DIAGNOSIS — F411 Generalized anxiety disorder: Secondary | ICD-10-CM | POA: Insufficient documentation

## 2012-12-28 DIAGNOSIS — E785 Hyperlipidemia, unspecified: Secondary | ICD-10-CM | POA: Insufficient documentation

## 2012-12-28 DIAGNOSIS — Z86718 Personal history of other venous thrombosis and embolism: Secondary | ICD-10-CM | POA: Insufficient documentation

## 2012-12-28 DIAGNOSIS — Z86711 Personal history of pulmonary embolism: Secondary | ICD-10-CM | POA: Insufficient documentation

## 2012-12-28 DIAGNOSIS — N400 Enlarged prostate without lower urinary tract symptoms: Secondary | ICD-10-CM | POA: Insufficient documentation

## 2012-12-28 DIAGNOSIS — K589 Irritable bowel syndrome without diarrhea: Secondary | ICD-10-CM | POA: Insufficient documentation

## 2012-12-28 DIAGNOSIS — Z9181 History of falling: Secondary | ICD-10-CM | POA: Insufficient documentation

## 2012-12-28 DIAGNOSIS — Z87828 Personal history of other (healed) physical injury and trauma: Secondary | ICD-10-CM | POA: Insufficient documentation

## 2012-12-28 DIAGNOSIS — Z79899 Other long term (current) drug therapy: Secondary | ICD-10-CM | POA: Insufficient documentation

## 2012-12-28 DIAGNOSIS — F4310 Post-traumatic stress disorder, unspecified: Secondary | ICD-10-CM | POA: Insufficient documentation

## 2012-12-28 DIAGNOSIS — Z7901 Long term (current) use of anticoagulants: Secondary | ICD-10-CM | POA: Insufficient documentation

## 2012-12-28 DIAGNOSIS — Z872 Personal history of diseases of the skin and subcutaneous tissue: Secondary | ICD-10-CM | POA: Insufficient documentation

## 2012-12-28 DIAGNOSIS — K5900 Constipation, unspecified: Secondary | ICD-10-CM | POA: Insufficient documentation

## 2012-12-28 DIAGNOSIS — K5641 Fecal impaction: Secondary | ICD-10-CM

## 2012-12-28 DIAGNOSIS — Z880 Allergy status to penicillin: Secondary | ICD-10-CM | POA: Insufficient documentation

## 2012-12-28 DIAGNOSIS — I2510 Atherosclerotic heart disease of native coronary artery without angina pectoris: Secondary | ICD-10-CM | POA: Insufficient documentation

## 2012-12-28 LAB — CBC WITH DIFFERENTIAL/PLATELET
Basophils Absolute: 0 10*3/uL (ref 0.0–0.1)
Basophils Relative: 0 % (ref 0–1)
Eosinophils Absolute: 0.1 10*3/uL (ref 0.0–0.7)
Eosinophils Relative: 2 % (ref 0–5)
HCT: 41.9 % (ref 39.0–52.0)
Lymphocytes Relative: 16 % (ref 12–46)
MCH: 32.2 pg (ref 26.0–34.0)
MCHC: 34.1 g/dL (ref 30.0–36.0)
Monocytes Absolute: 0.9 10*3/uL (ref 0.1–1.0)
Neutro Abs: 6.4 10*3/uL (ref 1.7–7.7)
Platelets: 250 10*3/uL (ref 150–400)
RDW: 13.9 % (ref 11.5–15.5)
WBC: 8.9 10*3/uL (ref 4.0–10.5)

## 2012-12-28 LAB — OCCULT BLOOD, POC DEVICE: Fecal Occult Bld: POSITIVE — AB

## 2012-12-28 MED ORDER — LIDOCAINE HCL 2 % EX GEL
Freq: Once | CUTANEOUS | Status: DC
  Filled 2012-12-28: qty 10

## 2012-12-28 MED ORDER — MAGNESIUM CITRATE PO SOLN
1.0000 | Freq: Once | ORAL | Status: AC
  Administered 2012-12-29: 1 via ORAL
  Filled 2012-12-28: qty 296

## 2012-12-28 NOTE — ED Notes (Signed)
Disimpaction with Lido Jelly with results. Removed moderate amount of stool from rectum. Col[place]io and soft with small amount of blood. Pt states he feel some relief.

## 2012-12-28 NOTE — ED Notes (Addendum)
Enema at 3pm due to abdominal pain at SNF @ [facility]. Pt states he noticed a lot of blood after enema, prompting him to come to ED. Pt states he hasn't passed out but feels a little dizzy. Admits to nausea, denies vomiting  BP:  143/93 P:98 SPO2: 96% 2LO2

## 2012-12-28 NOTE — ED Notes (Addendum)
Verbal order to disimpact pt, Jason Distance, PA/ Malva Cogan

## 2012-12-28 NOTE — ED Provider Notes (Signed)
CSN: [id number]     Arrival date & time 12/28/12  2057 History   First MD Initiated Contact with Patient 12/28/12 2143     No chief complaint on file.  (Consider location/radiation/quality/duration/timing/severity/associated sxs/prior Treatment) HPI Comments: [name] is an 79 year old gentleman who presents with a 5 day history of constipation.  He states that he has had this in the past and usually has to be disimpacted.  He states that he and his daughter tried to do this at home and stopped when he noticed rectal bleeding and pain.  He is on xarelto for DVT.  He reports mild nausea today but was able to eat well.  He denies vomiting, abdominal pain, history of abdominal surgeries or obstruction.  He reports normally stool is soft and formed but that he does get constipation frequently related to his IBS.  He reports scant amount of BRBPR.  Denies syncope, lightheadedness, dizziness.  Patient is a 79 y.o. male presenting with hematochezia. The history is provided by the patient and a relative. No language interpreter was used.  Rectal Bleeding Quality:  Bright red Amount:  Scant Duration:  2 hours Timing:  Rare Chronicity:  New Context: constipation, hemorrhoids and rectal pain   Pain details:    Quality:  Aching Relieved by:  Nothing Worsened by:  Defecation and wiping Ineffective treatments:  None tried Associated symptoms: no abdominal pain, no fever, no hematemesis, no light-headedness, no loss of consciousness and no vomiting   Risk factors: anticoagulant use     Past Medical History  Diagnosis Date  . PVD (peripheral vascular disease)   . CAD (coronary artery disease)     a. 12/2005 - PCI to OM  . Hyperlipidemia   . Cerebrovascular accident   . DVT femoral (deep venous thrombosis) with thrombophlebitis   . GERD (gastroesophageal reflux disease)   . Edema   . Anxiety   . Vertigo   . IBS (irritable bowel syndrome)   . Duodenitis   . Gastritis   . Esophageal  stricture   . Cellulitis   . Pulmonary embolism   . Prostatic hypertrophy   . Frequent falls   . Subdural hematoma   . PTSD (post-traumatic stress disorder)   . Vertigo   . TIA (transient ischemic attack)     a. 01/2006 and 05/2006.   [name] Hiatal hernia     periferal neuropathy  . Hx of echocardiogram     Echo 9/13:  Mild LVH, EF 60-65%, Gr 1 diast dysfn, mild AI, mild BAE   Past Surgical History  Procedure Laterality Date  . Hand surgery    . Angioplasty    . Carotid endarterectomy      Left  . Bare-metal stenting      left circumflex  . Bilateral cataract surgery     Family History  Problem Relation Age of Onset  . Colon cancer Neg Hx   . Coronary artery disease Brother   . Heart attack Father    History  Substance Use Topics  . Smoking status: Never Smoker   . Smokeless tobacco: Never Used  . Alcohol Use: No     Comment: Wine occasional    Review of Systems  Constitutional: Negative for fever and chills.  Eyes: Negative for pain.  Respiratory: Negative for chest tightness and shortness of breath.   Cardiovascular: Negative for chest pain.  Gastrointestinal: Positive for nausea, constipation, hematochezia, anal bleeding and rectal pain. Negative for vomiting, abdominal pain, diarrhea  and hematemesis.  Genitourinary: Negative for dysuria.  Neurological: Negative for loss of consciousness and light-headedness.  All other systems reviewed and are negative.    Allergies  Amoxicillin-pot clavulanate; Fludrocortisone acetate; and Scopace  Home Medications   Current Outpatient Rx  Name  Route  Sig  Dispense  Refill  . albuterol (PROVENTIL HFA;VENTOLIN HFA) 108 (90 BASE) MCG/ACT inhaler   Inhalation   Inhale 2 puffs into the lungs every 6 (six) hours as needed for wheezing.   1 Inhaler   2   . ALPRAZolam (XANAX) 0.25 MG tablet   Oral   Take 0.25 mg by mouth 2 (two) times daily as needed for sleep or anxiety.         . cholecalciferol (VITAMIN D) 1000 UNITS  tablet   Oral   Take 1,000 Units by mouth daily.         [name] dexlansoprazole (DEXILANT) 60 MG capsule   Oral   Take 60 mg by mouth daily.         [name] docusate sodium (COLACE) 100 MG capsule   Oral   Take 100 mg by mouth at bedtime.         . DULoxetine (CYMBALTA) 30 MG capsule   Oral   Take 30 mg by mouth at bedtime.         [name] latanoprost (XALATAN) 0.005 % ophthalmic solution   Both Eyes   Place 1 drop into both eyes at bedtime.         . meclizine (ANTIVERT) 12.5 MG tablet   Oral   Take 1 tablet (12.5 mg total) by mouth 3 (three) times daily as needed for dizziness. For dizziness.   30 tablet   2   . nitroGLYCERIN (NITROSTAT) 0.4 MG SL tablet   Sublingual   Place 0.4 mg under the tongue every 5 (five) minutes as needed. Chest pain         . ondansetron (ZOFRAN) 4 MG tablet   Oral   Take 4 mg by mouth every 8 (eight) hours as needed for nausea.         . polyethylene glycol powder (MIRALAX) powder   Oral   Take 17 g by mouth at bedtime. Constipation          . promethazine (PHENERGAN) 25 MG tablet   Oral   Take 1 tablet (25 mg total) by mouth every 6 (six) hours as needed. nausea   30 tablet   1   . Rivaroxaban (XARELTO) 15 MG TABS tablet   Oral   Take 15 mg by mouth daily with supper.         . Tamsulosin HCl (FLOMAX) 0.4 MG CAPS   Oral   Take 0.4 mg by mouth daily.          [name] tiZANidine (ZANAFLEX) 2 MG tablet   Oral   Take 2 mg by mouth every 6 (six) hours as needed (for muscle relaxant).         . zolpidem (AMBIEN) 5 MG tablet   Oral   Take 5 mg by mouth at bedtime as needed. For insomnia.          BP 161/73  Pulse 72  Temp(Src) 98.1 F (36.7 C) (Oral)  Resp 11  SpO2 95% Physical Exam  Nursing note and vitals reviewed. Constitutional: He is oriented to person, place, and time. He appears well-developed and well-nourished. No distress.  HENT:  Head: Normocephalic and atraumatic.  Right Ear: External ear  normal.  Left Ear:  External ear normal.  Nose: Nose normal.  Mouth/Throat: Oropharynx is clear and moist. No oropharyngeal exudate.  Eyes: Conjunctivae are normal. Pupils are equal, round, and reactive to light. No scleral icterus.  Neck: Normal range of motion. Neck supple.  Cardiovascular: Normal rate, regular rhythm and normal heart sounds.  Exam reveals no gallop and no friction rub.   No murmur heard. Pulmonary/Chest: Effort normal and breath sounds normal. No respiratory distress. He has no wheezes. He has no rales. He exhibits no tenderness.  Abdominal: Soft. Bowel sounds are normal. He exhibits no distension. There is no tenderness.  Musculoskeletal: Normal range of motion. He exhibits no edema and no tenderness.  Lymphadenopathy:    He has no cervical adenopathy.  Neurological: He is alert and oriented to person, place, and time. He exhibits normal muscle tone. Coordination normal.  Skin: Skin is warm and dry. No rash noted. No erythema. No pallor.  Psychiatric: He has a normal mood and affect. His behavior is normal. Judgment and thought content normal.    ED Course  Procedures (including critical care time) Labs Review Labs Reviewed  OCCULT BLOOD, POC DEVICE - Abnormal; Notable for the following:    Fecal Occult Bld POSITIVE (*)    All other components within normal limits  CBC WITH DIFFERENTIAL   Imaging Review No results found.  EKG Interpretation   None      Results for orders placed during the hospital encounter of 12/28/12  CBC WITH DIFFERENTIAL      Result Value Range   WBC 8.9  4.0 - 10.5 K/uL   RBC 4.44  4.22 - 5.81 MIL/uL   Hemoglobin 14.3  13.0 - 17.0 g/dL   HCT 16.1  09.6 - 04.5 %   MCV 94.4  78.0 - 100.0 fL   MCH 32.2  26.0 - 34.0 pg   MCHC 34.1  30.0 - 36.0 g/dL   RDW 40.9  81.1 - 91.4 %   Platelets 250  150 - 400 K/uL   Neutrophils Relative % 72  43 - 77 %   Neutro Abs 6.4  1.7 - 7.7 K/uL   Lymphocytes Relative 16  12 - 46 %   Lymphs Abs 1.5  0.7 - 4.0 K/uL    Monocytes Relative 10  3 - 12 %   Monocytes Absolute 0.9  0.1 - 1.0 K/uL   Eosinophils Relative 2  0 - 5 %   Eosinophils Absolute 0.1  0.0 - 0.7 K/uL   Basophils Relative 0  0 - 1 %   Basophils Absolute 0.0  0.0 - 0.1 K/uL  OCCULT BLOOD, POC DEVICE      Result Value Range   Fecal Occult Bld POSITIVE (*) NEGATIVE   Dg Chest 2 View  12/16/2012   CLINICAL DATA:  Cough and shortness of breath, chest pain  EXAM: CHEST  2 VIEW  COMPARISON:  12/17/2011 at [facility]  FINDINGS: The heart size and mediastinal contours are within normal limits. Both lungs are clear with the exception of minimal linear left lower lobe scarring or atelectasis. The visualized skeletal structures are unremarkable. IVC filter partially visualized. Lumbar spine degenerative change are noted.  IMPRESSION: No active cardiopulmonary disease.   Electronically Signed   By: Christiana Pellant M.D.   On: 12/16/2012 15:20   Ct Head Wo Contrast  12/16/2012   CLINICAL DATA:  Dizzy for several days, no known injury  EXAM: CT HEAD WITHOUT CONTRAST  TECHNIQUE: Contiguous axial images were obtained from the base of the skull through the vertex without intravenous contrast.  COMPARISON:  01/03/2010  FINDINGS: Chronic atrophy and low attenuation in the deep white matter. Progressive low attenuation in the posterior parietal lobes white matter above the level of the ventricles, which could represent the results of subacute to chronic infarction or progressive chronic involutional change. No hydrocephalus. No hemorrhage or there is an air-fluid level in the right sphenoid sinus. This was not present previously.  IMPRESSION: Acute on chronic right sphenoid sinusitis. Chronic involutional change otherwise.   Electronically Signed   By: Esperanza Heir M.D.   On: 12/16/2012 15:45   Ct Angio Chest Pe W/cm &/or Wo Cm  12/16/2012   CLINICAL DATA:  DVT, chest pain, history of pulmonary embolism  EXAM: CT ANGIOGRAPHY CHEST WITH CONTRAST   TECHNIQUE: Multidetector CT imaging of the chest was performed using the standard protocol during bolus administration of intravenous contrast. Multiplanar CT image reconstructions including MIPs were obtained to evaluate the vascular anatomy.  CONTRAST:  OMNIPAQUE IOHEXOL 350 MG/ML SOLN  COMPARISON:  Chest radiograph performed today  FINDINGS: 3 cm right thyroid nodule. Thoracic aorta shows no dissection or dilatation. There is calcification of the thoracic aorta and of the coronary arteries. No filling defects in the pulmonary arterial system. Lungs are clear. No pleural effusions. Small hiatal hernia. Scans through the upper abdomen negative.  Review of the MIP images confirms the above findings.  IMPRESSION: 1. No pulmonary embolism. No acute cardiopulmonary process. 2. Thyroid nodule, recommend nonemergent correlation with ultrasound of the thyroid.   Electronically Signed   By: Esperanza Heir M.D.   On: 12/16/2012 18:51   Dg Abd Acute W/chest  12/29/2012   CLINICAL DATA:  Constipation/impaction.  EXAM: ACUTE ABDOMEN SERIES (ABDOMEN 2 VIEW & CHEST 1 VIEW)  COMPARISON:  07/17/2012  FINDINGS: Chronic blunting of the left costophrenic sulcus, likely related to of fat pad based on previous CT imaging. Stable heart size. No infiltrate, edema, or pneumothorax.  Formed stool throughout the colon. No evidence for small bowel obstruction. No evidence for fecal impaction.  No pneumoperitoneum. Similar orientation of an IVC filter. Degenerative dextroscoliosis with chronic L2 and L3 vertebral body wedging.  IMPRESSION: Constipation.  No proximal obstruction.   Electronically Signed   By: Tiburcio Pea M.D.   On: 12/29/2012 00:44     MDM  Constipation Rectal impaction  Patient with history of rectal impaction presents with continued constipation and impaction without signs of obstruction.  Though he is on xarelto, and had some rectal bleeding after trying to disimpact himself, hemoglobin here is  stable.  There is no abdominal pain and rectal pain was relieved after disimpaction so I doubt obstruction.  Izola Price Marisue Humble, PA-C 12/29/12 0104

## 2012-12-29 MED ORDER — POLYETHYLENE GLYCOL 3350 17 GM/SCOOP PO POWD: 17.0000 g | Freq: Every day | ORAL | Status: DC

## 2012-12-29 NOTE — ED Provider Notes (Signed)
Medical screening examination/treatment/procedure(s) were conducted as a shared visit with non-physician practitioner(s) and myself.  I personally evaluated the patient during the encounter Pt with constipation.  Was disimpacted.  No abd pain on my exam.  Rolan Bucco, MD 12/29/12 [phone]

## 2012-12-29 NOTE — ED Notes (Signed)
Dispatch called, PTAR

## 2013-01-09 ENCOUNTER — Other Ambulatory Visit: Payer: Self-pay | Admitting: Cardiovascular Disease

## 2013-01-16 ENCOUNTER — Other Ambulatory Visit: Payer: Self-pay | Admitting: Cardiovascular Disease

## 2013-01-16 NOTE — Telephone Encounter (Signed)
Patient was suppose to f/u with MD 4 months after visit 02/2012. Partial refill.

## 2013-01-21 ENCOUNTER — Other Ambulatory Visit: Payer: Self-pay | Admitting: Family Medicine

## 2013-02-13 ENCOUNTER — Other Ambulatory Visit: Payer: Self-pay | Admitting: Cardiovascular Disease

## 2013-02-14 NOTE — Telephone Encounter (Signed)
Should I just give a partial refill? Patient was to follow up in 4 months and failed to do so. I see that he has had several warnings for an appointment, but still has not scheduled one. Normally this is a med that Addison Lank, RN refills but she is not here today.Please advise. Thanks, MI

## 2013-02-14 NOTE — Telephone Encounter (Signed)
Please provide the pt with a 15 day supply and contact the pt for an appointment.  Thank you, Jason [name]

## 2013-02-14 NOTE — Telephone Encounter (Signed)
Spoke with patients daughter and she scheduled and appointment for 03/19/12 with Dr Excell Seltzer. I will send in a refill to last until then.

## 2013-03-07 ENCOUNTER — Other Ambulatory Visit: Payer: Self-pay | Admitting: Family Medicine

## 2013-03-07 NOTE — Telephone Encounter (Signed)
Medication refilled per protocol. 

## 2013-03-10 ENCOUNTER — Telehealth: Payer: Self-pay | Admitting: Family Medicine

## 2013-03-10 NOTE — Telephone Encounter (Signed)
Junie Panning is calling today because her fathers bp has been going up and down over past few days and she is wanting to know if he should have an apt or keep an eye on it Call back number is [phone]

## 2013-03-11 NOTE — Telephone Encounter (Signed)
Given his age, I feel these are fine and require no intervention at present.

## 2013-03-11 NOTE — Telephone Encounter (Signed)
Can we get a range of what his BP is running so I can make an assessment.

## 2013-03-11 NOTE — Telephone Encounter (Signed)
.  Patient's dtr aware

## 2013-03-11 NOTE — Telephone Encounter (Signed)
161/096,045/40,981/19,147/82,956/21 - I called [facility] to get these readings and spoke with dtr she stated that he has an appointment with his cardiologist 03/19/13 if this can wait until then they will discuss it with him. It was totally up to you/

## 2013-03-14 ENCOUNTER — Telehealth: Payer: Self-pay | Admitting: *Deleted

## 2013-03-14 ENCOUNTER — Other Ambulatory Visit: Payer: Self-pay | Admitting: Neurology

## 2013-03-14 MED ORDER — HYDROCODONE-ACETAMINOPHEN 5-325 MG PO TABS: 1.0000 | ORAL_TABLET | Freq: Four times a day (QID) | ORAL | Status: DC | PRN

## 2013-03-14 NOTE — Telephone Encounter (Signed)
RX printed, left up front and patient's dtr aware to pick up  

## 2013-03-14 NOTE — Telephone Encounter (Signed)
ok 

## 2013-03-14 NOTE — Telephone Encounter (Signed)
?   OK to Refill  

## 2013-03-17 ENCOUNTER — Other Ambulatory Visit: Payer: Self-pay | Admitting: Cardiovascular Disease

## 2013-03-19 ENCOUNTER — Encounter: Payer: Self-pay | Admitting: Cardiovascular Disease

## 2013-03-19 ENCOUNTER — Ambulatory Visit (INDEPENDENT_AMBULATORY_CARE_PROVIDER_SITE_OTHER): Payer: Medicare Other | Admitting: Cardiovascular Disease

## 2013-03-19 ENCOUNTER — Encounter (INDEPENDENT_AMBULATORY_CARE_PROVIDER_SITE_OTHER): Payer: Self-pay

## 2013-03-19 VITALS — BP 140/82 | HR 72 | Ht 70.0 in | Wt 185.4 lb

## 2013-03-19 DIAGNOSIS — I4891 Unspecified atrial fibrillation: Secondary | ICD-10-CM

## 2013-03-19 NOTE — Patient Instructions (Signed)
Your physician recommends that you continue on your current medications as directed. Please refer to the Current Medication list given to you today.  Your physician wants you to follow-up in: 1 year with Dr. Cooper.  You will receive a reminder letter in the mail two months in advance. If you don't receive a letter, please call our office to schedule the follow-up appointment.   

## 2013-03-19 NOTE — Progress Notes (Signed)
HPI:  80 year old gentleman presenting for followup evaluation. He's followed for coronary artery disease and paroxysmal atrial fibrillation. The patient has had recurrent DVTs and has undergone IVC filter placement. He is tolerating Xarelto for chronic anticoagulation and has had no bleeding problems. He complains of generalized leg weakness and gait unsteadiness. He has not had recent falls. He has mild chronic dyspnea. He denies edema, orthopnea, PND, or syncope. He lives in an assisted-living facility and blood pressures have been high on an episodic basis. However, they are not consistently high. Even on low-dose amlodipine he had symptoms of dizziness associated with low blood pressure.  Outpatient Encounter Prescriptions as of 03/19/2013  Medication Sig  . ALPRAZolam (XANAX) 0.25 MG tablet TAKE 1 TABLET BY MOUTH TWICE DAILY AS NEEDED  . cholecalciferol (VITAMIN D) 1000 UNITS tablet Take 1,000 Units by mouth daily.  [name] DEXILANT 60 MG capsule TAKE 1 CAPSULE BY MOUTH EVERY DAY  . docusate sodium (COLACE) 100 MG capsule Take 100 mg by mouth at bedtime.  . DULoxetine (CYMBALTA) 30 MG capsule Take 30 mg by mouth at bedtime.  [name] HYDROcodone-acetaminophen (NORCO) 5-325 MG per tablet Take 1-2 tablets by mouth every 6 (six) hours as needed for moderate pain.  [name] latanoprost (XALATAN) 0.005 % ophthalmic solution Place 1 drop into both eyes at bedtime.  . meclizine (ANTIVERT) 12.5 MG tablet Take 1 tablet (12.5 mg total) by mouth 3 (three) times daily as needed for dizziness. For dizziness.  . nitroGLYCERIN (NITROSTAT) 0.4 MG SL tablet Place 0.4 mg under the tongue every 5 (five) minutes as needed. Chest pain  . ondansetron (ZOFRAN) 4 MG tablet Take 4 mg by mouth every 8 (eight) hours as needed for nausea.  . polyethylene glycol powder (GLYCOLAX/MIRALAX) powder MIX 17 GRAMS WITH 4 TO 6 OUNCES OF WATER AND DRINK ONCE DAILY AS NEEDED FOR CONSTIPATION  . promethazine (PHENERGAN) 25 MG tablet Take 1 tablet (25  mg total) by mouth every 6 (six) hours as needed. nausea  . Tamsulosin HCl (FLOMAX) 0.4 MG CAPS Take 0.4 mg by mouth daily.   [name] tiZANidine (ZANAFLEX) 2 MG tablet Take 2 mg by mouth every 6 (six) hours as needed (for muscle relaxant).  [name] 15 MG TABS tablet TAKE 1 TABLET BY MOUTH EVERY DAY WITH SUPPER  . zolpidem (AMBIEN) 5 MG tablet Take 5 mg by mouth at bedtime as needed. For insomnia.  . [DISCONTINUED] albuterol (PROVENTIL HFA;VENTOLIN HFA) 108 (90 BASE) MCG/ACT inhaler Inhale 2 puffs into the lungs every 6 (six) hours as needed for wheezing.    Allergies  Allergen Reactions  . Amoxicillin-Pot Clavulanate Nausea And Vomiting    daughter states he can tolerate Amoxicillin ok  . Fludrocortisone Acetate Other (See Comments)    unknown  . Scopace [Scopolamine] Other (See Comments)    *patch Unknown reaction    Past Medical History  Diagnosis Date  . PVD (peripheral vascular disease)   . CAD (coronary artery disease)     a. 12/2005 - PCI to OM  . Hyperlipidemia   . Cerebrovascular accident   . DVT femoral (deep venous thrombosis) with thrombophlebitis   . GERD (gastroesophageal reflux disease)   . Edema   . Anxiety   . Vertigo   . IBS (irritable bowel syndrome)   . Duodenitis   . Gastritis   . Esophageal stricture   . Cellulitis   . Pulmonary embolism   . Prostatic hypertrophy   . Frequent falls   . Subdural hematoma   .  PTSD (post-traumatic stress disorder)   . Vertigo   . TIA (transient ischemic attack)     a. 01/2006 and 05/2006.   [name]. Hiatal hernia     periferal neuropathy  . Hx of echocardiogram     Echo 9/13:  Mild LVH, EF 60-65%, Gr 1 diast dysfn, mild AI, mild BAE    ROS: Negative except as per HPI  BP 140/82  Pulse 72  Ht 5\' 10"  (1.778 m)  Wt 185 lb 6.4 oz (84.097 kg)  BMI 26.60 kg/m2  SpO2 94%  PHYSICAL EXAM: Pt is alert and oriented, pleasant elderly male in NAD HEENT: normal Neck: JVP - normal, carotids 2+= with bilateral carotid  bruits Lungs: CTA bilaterally CV: RRR without murmur or gallop Abd: soft, NT, Positive BS, no hepatomegaly Ext: no C/C/E, distal pulses intact and equal Skin: warm/dry no rash  ASSESSMENT AND PLAN: 1. Paroxysmal atrial fibrillation. The patient is maintaining sinus rhythm. He is anticoagulated with Xarelto which is dose adjusted for his reduced creatinine clearance. He reports no bleeding problems. Will continue his current medical program without changes.  2. Coronary artery disease, native vessel. He has undergone remote stenting of the left circumflex. He's having no anginal symptoms. He is on no antiplatelet therapy since he is chronically anticoagulated.  3. Hypertension. Blood pressure control is adequate on no medications at the present time.  I will see him back in one year for followup.  Tonny BollmanMichael Yesena Reaves 03/19/2013 1:52 PM

## 2013-05-07 ENCOUNTER — Encounter: Payer: Self-pay | Admitting: Family Medicine

## 2013-05-07 NOTE — Telephone Encounter (Signed)
This encounter was created in error - please disregard.

## 2013-06-23 ENCOUNTER — Other Ambulatory Visit: Payer: Self-pay | Admitting: Neurology

## 2013-07-02 ENCOUNTER — Other Ambulatory Visit: Payer: Self-pay | Admitting: Neurology

## 2013-07-18 ENCOUNTER — Other Ambulatory Visit: Payer: Self-pay | Admitting: Neurology

## 2013-07-23 ENCOUNTER — Other Ambulatory Visit: Payer: Self-pay | Admitting: Neurology

## 2013-07-23 NOTE — Telephone Encounter (Signed)
Called patient, got no answer.  Left message.  

## 2013-08-06 ENCOUNTER — Telehealth: Payer: Self-pay | Admitting: Family Medicine

## 2013-08-06 NOTE — Telephone Encounter (Signed)
Message copied by Ricard Dillon on Wed Aug 06, 2013  4:17 PM ------      Message from: Malvin Johns      Created: Wed Aug 06, 2013  1:44 PM       [phone] Jason [name] is daughter             Patients daughter is calling to ask some questions about some surgery that has to be done to her dads teeth, and possibly having to take him off of one of his medications to do this  ------

## 2013-08-06 NOTE — Telephone Encounter (Signed)
Pt is needing to have a tooth pulled that has broken off past the gum line and the oral surgeon told pt's daughter to call us to dc his xeralto prior to having this done.

## 2013-08-07 NOTE — Telephone Encounter (Signed)
Hold xarelto 48 hrs prior to surgery and resume after surgery.

## 2013-08-07 NOTE — Telephone Encounter (Signed)
Pt's daughter aware and we will need to fax order to nursing home to stop it once they have a set date for the extraction.

## 2013-08-08 ENCOUNTER — Ambulatory Visit (INDEPENDENT_AMBULATORY_CARE_PROVIDER_SITE_OTHER): Payer: Medicare Other | Admitting: Family Medicine

## 2013-08-08 ENCOUNTER — Encounter: Payer: Self-pay | Admitting: Family Medicine

## 2013-08-08 VITALS — BP 144/72 | HR 64 | Temp 98.2°F | Resp 16 | Ht 69.0 in | Wt 183.0 lb

## 2013-08-08 DIAGNOSIS — IMO0002 Reserved for concepts with insufficient information to code with codable children: Secondary | ICD-10-CM

## 2013-08-08 MED ORDER — CEPHALEXIN 500 MG PO CAPS: 500.0000 mg | ORAL_CAPSULE | Freq: Two times a day (BID) | ORAL | Status: DC

## 2013-08-08 NOTE — Patient Instructions (Signed)
Start keflex antibiotics Use warm compress Call if he develops fever, chills or worsening infection F/U as needed

## 2013-08-08 NOTE — Progress Notes (Signed)
Patient ID: Jason [name], male   DOB: [birth year], 80 y.o.   MRN: [id number]    Subjective:    Patient ID: Jason [name], male    DOB: 1919/08/05, 80 y.o.   MRN: [id number]  Patient presents for R forearm skin tear  Patient here with infected skin tear. He had a skin tear on his arm about 2 weeks ago. A small bump   popped up in the same area with redness surrounding it over the past couple days. He's not had any fever or chills no significant pain. Last night but not actually opened up and drained some bloody discharge. He was recently on amoxicillin secondary to an abscess right upper tooth. They're not planning any surgical intervention secondary to his other comorbidities in his wrist with his blood thinner. He's here today with his daughter here    Review Of Systems:  GEN- denies fatigue, fever, weight loss,weakness, recent illness HEENT- denies eye drainage, change in vision, nasal discharge, CVS- denies chest pain, palpitations RESP- denies SOB, cough, wheeze MSK- denies joint pain, muscle aches, injury Neuro- denies headache, dizziness, syncope, seizure activity       Objective:    BP 144/72  Pulse 64  Temp(Src) 98.2 F (36.8 C) (Oral)  Resp 16  Ht 5\' 9"  (1.753 m)  Wt 183 lb (83.008 kg)  BMI 27.01 kg/m2 GEN- NAD, alert and oriented x3 HEENT- PERRL. EOMI, non icteric, MMM, oropharynx poor dentition, mild swelling upper right gumline, no discrete abscess CVS- irregular irregular rhythm, no murmur RESP-CTAB Skin- Right forearm- small open boil -  Tiny amount of serosanguinous fluid surrounded by 10x12cm area of erythema, NT,+warmth, no induration EXT- trace pedal edema Pulses- Radial, DP- 2+        Assessment & Plan:      Problem List Items Addressed This Visit   None    Visit Diagnoses   Cellulitis and abscess of upper arm and forearm    -  Primary    no area to drain today, good amount of cellulitis around previous skin tear that became infected, start keflex,  warm compresses, instructions written for ALF, return if not improving       Note: This dictation was prepared with Dragon dictation along with smaller phrase technology. Any transcriptional errors that result from this process are unintentional.

## 2013-08-31 ENCOUNTER — Other Ambulatory Visit: Payer: Self-pay | Admitting: Neurology

## 2013-09-04 ENCOUNTER — Other Ambulatory Visit: Payer: Self-pay | Admitting: Neurology

## 2013-09-04 NOTE — Telephone Encounter (Signed)
Patient has an appt scheduled in Nov  

## 2013-09-05 NOTE — Telephone Encounter (Signed)
Rx signed and faxed.

## 2013-09-23 ENCOUNTER — Telehealth: Payer: Self-pay | Admitting: Family Medicine

## 2013-09-23 NOTE — Telephone Encounter (Signed)
PTs daughter [name] has called she states she is needing a hard copy of the HYDROcodone-acetaminophen (NORCO) 5-325 MG per tablet because the pharmacy want refill it to she has one   Call back number is [phone]

## 2013-09-23 NOTE — Telephone Encounter (Signed)
ok 

## 2013-09-23 NOTE — Telephone Encounter (Signed)
?   OK to Refill  

## 2013-09-24 MED ORDER — HYDROCODONE-ACETAMINOPHEN 5-325 MG PO TABS: 1.0000 | ORAL_TABLET | Freq: Four times a day (QID) | ORAL | Status: DC | PRN

## 2013-09-24 NOTE — Telephone Encounter (Signed)
RX printed, left up front and patient aware to pick up  

## 2013-09-29 ENCOUNTER — Other Ambulatory Visit: Payer: Self-pay | Admitting: Family Medicine

## 2013-09-29 NOTE — Telephone Encounter (Signed)
?   OK to Refill  

## 2013-09-29 NOTE — Telephone Encounter (Signed)
ok 

## 2013-10-22 ENCOUNTER — Other Ambulatory Visit: Payer: Self-pay | Admitting: Cardiovascular Disease

## 2013-12-16 ENCOUNTER — Telehealth: Payer: Self-pay | Admitting: Family Medicine

## 2013-12-16 NOTE — Telephone Encounter (Signed)
LM for daughter to call back and schedule [facility]

## 2014-01-13 ENCOUNTER — Encounter: Payer: Self-pay | Admitting: Family Medicine

## 2014-01-13 ENCOUNTER — Ambulatory Visit (INDEPENDENT_AMBULATORY_CARE_PROVIDER_SITE_OTHER): Payer: Medicare Other | Admitting: Family Medicine

## 2014-01-13 VITALS — BP 132/78 | HR 62 | Temp 97.9°F | Resp 14 | Ht 70.0 in | Wt 183.0 lb

## 2014-01-13 DIAGNOSIS — Z23 Encounter for immunization: Secondary | ICD-10-CM

## 2014-01-13 DIAGNOSIS — Z0000 Encounter for general adult medical examination without abnormal findings: Secondary | ICD-10-CM

## 2014-01-13 MED ORDER — ACETAMINOPHEN 500 MG PO TABS: 1000.0000 mg | ORAL_TABLET | Freq: Two times a day (BID) | ORAL | Status: DC

## 2014-01-13 MED ORDER — PREDNISONE 10 MG PO TABS: 10.0000 mg | ORAL_TABLET | Freq: Every day | ORAL | Status: DC

## 2014-01-13 MED ORDER — HYDROCODONE-ACETAMINOPHEN 5-325 MG PO TABS: 1.0000 | ORAL_TABLET | Freq: Four times a day (QID) | ORAL | Status: DC | PRN

## 2014-01-13 NOTE — Progress Notes (Signed)
Subjective:    Patient ID: Jason [name], male    DOB: [birth year], 80 y.o.   MRN: [id number]  HPI Patient is a very pleasant 80 year old white male who is here today for complete physical exam. I had a long discussion with the patient and his daughter. Given his age and his medical comorbidities I have strongly recommended against prostate cancer screening and colon cancer screening. At the patient and his daughter agree with this. Patient has already had his flu shot. He is due for Prevnar 13. He has had Pneumovax 23 and remote past. The remainder of his vaccinations are up-to-date. His biggest concern is daily pain in multiple joints throughout his body. He is unable to take NSAIDs due to the fact he is on Xarelto. He uses hydrocodone but he uses it sparingly due to severe constipation that he develops when he takes a narcotic. He is not currently taking any standing dose of Tylenol. The pain is limiting his quality of life. He is begging for something to help manage his pain that will not cause the constipation that he receives with narcotics. Past Medical History  Diagnosis Date  . PVD (peripheral vascular disease)   . CAD (coronary artery disease)     a. 12/2005 - PCI to OM  . Hyperlipidemia   . Cerebrovascular accident   . DVT femoral (deep venous thrombosis) with thrombophlebitis   . GERD (gastroesophageal reflux disease)   . Edema   . Anxiety   . Vertigo   . IBS (irritable bowel syndrome)   . Duodenitis   . Gastritis   . Esophageal stricture   . Cellulitis   . Pulmonary embolism   . Prostatic hypertrophy   . Frequent falls   . Subdural hematoma   . PTSD (post-traumatic stress disorder)   . Vertigo   . TIA (transient ischemic attack)     a. 01/2006 and 05/2006.   [name] Hiatal hernia     periferal neuropathy  . Hx of echocardiogram     Echo 9/13:  Mild LVH, EF 60-65%, Gr 1 diast dysfn, mild AI, mild BAE   Past Surgical History  Procedure Laterality Date  . Hand surgery    .  Angioplasty    . Carotid endarterectomy      Left  . Bare-metal stenting      left circumflex  . Bilateral cataract surgery     Current Outpatient Prescriptions on File Prior to Visit  Medication Sig Dispense Refill  . ALPRAZolam (XANAX) 0.25 MG tablet TAKE 1 TABLET BY MOUTH TWICE DAILY AS NEEDED* NEEDS OFFICE VISIT 30 tablet 4  . cholecalciferol (VITAMIN D) 1000 UNITS tablet Take 1,000 Units by mouth daily.    [name] DEXILANT 60 MG capsule TAKE 1 CAPSULE BY MOUTH EVERY DAY 30 capsule 11  . docusate sodium (COLACE) 100 MG capsule Take 100 mg by mouth at bedtime.    . DULoxetine (CYMBALTA) 30 MG capsule Take 1 capsule (30 mg total) by mouth daily. 30 capsule 5  . latanoprost (XALATAN) 0.005 % ophthalmic solution Place 1 drop into both eyes at bedtime.    . meclizine (ANTIVERT) 12.5 MG tablet TAKE 1 TABLET BY MOUTH THREE TIMES DAILY AS NEEDED FOR DIZZINESS 30 tablet 0  . nitroGLYCERIN (NITROSTAT) 0.4 MG SL tablet Place 0.4 mg under the tongue every 5 (five) minutes as needed. Chest pain    . ondansetron (ZOFRAN) 4 MG tablet Take 4 mg by mouth every 8 (eight) hours as needed for  nausea.    . polyethylene glycol powder (GLYCOLAX/MIRALAX) powder MIX 17 GRAMS WITH 4 TO 6 OUNCES OF WATER AND DRINK ONCE DAILY AS NEEDED FOR CONSTIPATION 527 g 2  . promethazine (PHENERGAN) 25 MG tablet Take 1 tablet (25 mg total) by mouth every 6 (six) hours as needed. nausea 30 tablet 1  . Tamsulosin HCl (FLOMAX) 0.4 MG CAPS Take 0.4 mg by mouth daily.     [name]. tiZANidine (ZANAFLEX) 2 MG tablet Take 2 mg by mouth every 6 (six) hours as needed (for muscle relaxant).    [name]. XARELTO 15 MG TABS tablet TAKE 1 TABLET BY MOUTH EVERY DAY WITH SUPPER 30 tablet 4  . zolpidem (AMBIEN) 5 MG tablet Take 5 mg by mouth at bedtime as needed. For insomnia.     No current facility-administered medications on file prior to visit.   Allergies  Allergen Reactions  . Amoxicillin-Pot Clavulanate Nausea And Vomiting    daughter states he can  tolerate Amoxicillin ok  . Fludrocortisone Acetate Other (See Comments)    unknown  . Scopace [Scopolamine] Other (See Comments)    *patch Unknown reaction   History   Social History  . Marital Status: Married    Spouse Name: N/A    Number of Children: N/A  . Years of Education: N/A   Occupational History  . Not on file.   Social History Main Topics  . Smoking status: Never Smoker   . Smokeless tobacco: Never Used  . Alcohol Use: No     Comment: Wine occasional  . Drug Use: No  . Sexual Activity: No   Other Topics Concern  . 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 Colon cancer Neg Hx   . Coronary artery disease Brother   . Heart attack Father       Review of Systems  All other systems reviewed and are negative.      Objective:   Physical Exam  Constitutional: He is oriented to person, place, and time. He appears well-developed and well-nourished. No distress.  HENT:  Head: Normocephalic and atraumatic.  Right Ear: External ear normal.  Left Ear: External ear normal.  Nose: Nose normal.  Mouth/Throat: Oropharynx is clear and moist. No oropharyngeal exudate.  Eyes: Conjunctivae and EOM are normal. Pupils are equal, round, and reactive to light. Right eye exhibits no discharge. Left eye exhibits no discharge. No scleral icterus.  Neck: Normal range of motion. Neck supple. No JVD present. No tracheal deviation present. No thyromegaly present.  Cardiovascular: Normal rate, regular rhythm, normal heart sounds and intact distal pulses.  Exam reveals no gallop and no friction rub.   No murmur heard. Pulmonary/Chest: Effort normal and breath sounds normal. No stridor. No respiratory distress. He has no wheezes. He has no rales. He exhibits no tenderness.  Abdominal: Soft. Bowel sounds are normal. He exhibits no distension. There is no tenderness. There is no  rebound and no guarding.  Musculoskeletal: He exhibits no edema.       Right shoulder: He exhibits decreased range of motion, tenderness and pain.       Left shoulder: He exhibits decreased range of motion, tenderness and pain.       Cervical back: He exhibits decreased range of motion, tenderness, bony tenderness and pain.       Right foot: There is decreased range of motion.  Left foot: There is decreased range of motion.  Lymphadenopathy:    He has no cervical adenopathy.  Neurological: He is alert and oriented to person, place, and time. He has normal reflexes. He displays normal reflexes. No cranial nerve deficit. He exhibits normal muscle tone. Coordination normal.  Skin: Skin is warm. No rash noted. He is not diaphoretic. No erythema. No pallor.  Psychiatric: He has a normal mood and affect. His behavior is normal. Judgment and thought content normal.  Vitals reviewed.         Assessment & Plan:  Routine general medical examination at a health care facility - Plan: COMPLETE METABOLIC PANEL WITH GFR, CBC with Differential, TSH  Patient's blood pressure is excellent. I will give the patient Prevnar 13 today in the office. This will update his immunizations. We have decided not to screen for cancer. Therefore I'll not check a PSA or colonoscopy. I will perform a CBC, CMP, TSH. I have recommended returning for a fasting lipid panel to monitor his cholesterol. We discussed his severe pain in multiple joints. I recommended the patient begin taking Tylenol 1000 mg by mouth twice a day on a standing basis. Also recommended patient began taking prednisone 10 mg by mouth daily on a standing basis. We discussed the risk of long-term corticosteroid use in the patient and his family are willing to accept those risk if this will provide him any kind of pain relief and improve his quality of life

## 2014-01-13 NOTE — Addendum Note (Signed)
Addended by: Legrand RamsWILLIS, Korine Winton B on: 01/13/2014 03:40 PM   Modules accepted: Orders

## 2014-01-14 LAB — COMPLETE METABOLIC PANEL WITH GFR
ALT: 10 U/L (ref 0–53)
AST: 14 U/L (ref 0–37)
Albumin: 3.5 g/dL (ref 3.5–5.2)
Alkaline Phosphatase: 76 U/L (ref 39–117)
BILIRUBIN TOTAL: 0.5 mg/dL (ref 0.2–1.2)
BUN: 24 mg/dL — AB (ref 6–23)
CALCIUM: 8.7 mg/dL (ref 8.4–10.5)
CO2: 23 meq/L (ref 19–32)
CREATININE: 1.21 mg/dL (ref 0.50–1.35)
Chloride: 105 mEq/L (ref 96–112)
GFR, Est African American: 60 mL/min
GFR, Est Non African American: 52 mL/min — ABNORMAL LOW
Glucose, Bld: 94 mg/dL (ref 70–99)
Potassium: 4.3 mEq/L (ref 3.5–5.3)
Sodium: 139 mEq/L (ref 135–145)
Total Protein: 6.8 g/dL (ref 6.0–8.3)

## 2014-01-14 LAB — CBC WITH DIFFERENTIAL/PLATELET
BASOS PCT: 1 % (ref 0–1)
Basophils Absolute: 0.1 10*3/uL (ref 0.0–0.1)
EOS ABS: 0.4 10*3/uL (ref 0.0–0.7)
Eosinophils Relative: 8 % — ABNORMAL HIGH (ref 0–5)
HCT: 43.4 % (ref 39.0–52.0)
Hemoglobin: 14.5 g/dL (ref 13.0–17.0)
LYMPHS ABS: 1.5 10*3/uL (ref 0.7–4.0)
Lymphocytes Relative: 27 % (ref 12–46)
MCH: 31.7 pg (ref 26.0–34.0)
MCHC: 33.4 g/dL (ref 30.0–36.0)
MCV: 95 fL (ref 78.0–100.0)
Monocytes Absolute: 0.6 10*3/uL (ref 0.1–1.0)
Monocytes Relative: 10 % (ref 3–12)
NEUTROS PCT: 54 % (ref 43–77)
Neutro Abs: 3 10*3/uL (ref 1.7–7.7)
PLATELETS: 192 10*3/uL (ref 150–400)
RBC: 4.57 MIL/uL (ref 4.22–5.81)
RDW: 15.1 % (ref 11.5–15.5)
WBC: 5.6 10*3/uL (ref 4.0–10.5)

## 2014-01-14 LAB — TSH: TSH: 4.174 u[IU]/mL (ref 0.350–4.500)

## 2014-01-16 ENCOUNTER — Encounter: Payer: Self-pay | Admitting: Family Medicine

## 2014-02-04 ENCOUNTER — Ambulatory Visit
Admission: RE | Admit: 2014-02-04 | Discharge: 2014-02-04 | Disposition: A | Discharge status: Home / Self Care | Payer: Medicare Other | Source: Ambulatory Visit | Attending: Neurology | Admitting: Neurology

## 2014-02-04 ENCOUNTER — Ambulatory Visit (INDEPENDENT_AMBULATORY_CARE_PROVIDER_SITE_OTHER): Payer: Medicare Other | Admitting: Neurology

## 2014-02-04 ENCOUNTER — Telehealth: Payer: Self-pay | Admitting: Neurology

## 2014-02-04 ENCOUNTER — Encounter: Payer: Self-pay | Admitting: Neurology

## 2014-02-04 VITALS — BP 193/99 | HR 68 | Ht 72.0 in | Wt 183.4 lb

## 2014-02-04 DIAGNOSIS — G609 Hereditary and idiopathic neuropathy, unspecified: Secondary | ICD-10-CM | POA: Insufficient documentation

## 2014-02-04 DIAGNOSIS — J3489 Other specified disorders of nose and nasal sinuses: Secondary | ICD-10-CM

## 2014-02-04 DIAGNOSIS — R269 Unspecified abnormalities of gait and mobility: Secondary | ICD-10-CM

## 2014-02-04 NOTE — Telephone Encounter (Signed)
I called the patient. I talk with the daughter. Sinus x-rays were clear, they will try decongestant over the weekend. If things do not improve they will contact me.

## 2014-02-04 NOTE — Progress Notes (Signed)
Reason for visit: Peripheral neuropathy  Jason [name] is an 80 y.o. male  History of present illness:  Jason [name] is a 80 year old right-handed white male with a history of a peripheral neuropathy associated with a significant gait disorder. The patient has primarily a sensory neuropathy with minimal weakness in the legs. The patient uses a walker for ambulation, but he still will have an occasional fall. The patient last fell about 2 weeks ago. The patient has chronic left-sided neck and shoulder discomfort, and MRI of the cervical spine was done several years ago showing disc bulges at the C4-5 and 5-6 levels. The patient recently was placed on Tylenol and prednisone for this pain, and his blood pressures recently have been somewhat elevated. Within the last day or 2, he has developed some pressure and discomfort in the frontal and maxillary sinus areas. The patient has some fullness sensations in the ears as well. The patient returns to this office for further evaluation.  Past Medical History  Diagnosis Date  . PVD (peripheral vascular disease)   . CAD (coronary artery disease)     a. 12/2005 - PCI to OM  . Hyperlipidemia   . Cerebrovascular accident   . DVT femoral (deep venous thrombosis) with thrombophlebitis   . GERD (gastroesophageal reflux disease)   . Edema   . Anxiety   . Vertigo   . IBS (irritable bowel syndrome)   . Duodenitis   . Gastritis   . Esophageal stricture   . Cellulitis   . Pulmonary embolism   . Prostatic hypertrophy   . Frequent falls   . Subdural hematoma   . PTSD (post-traumatic stress disorder)   . Vertigo   . TIA (transient ischemic attack)     a. 01/2006 and 05/2006.   [name]. Hiatal hernia     periferal neuropathy  . Hx of echocardiogram     Echo 9/13:  Mild LVH, EF 60-65%, Gr 1 diast dysfn, mild AI, mild BAE  . Gout   . Chronic low back pain   . Gait disorder   . Peripheral neuropathy   . Hydrocele   . Migraine   . History of shingles    Left lumbar  . HOH (hard of hearing)     hearing aid    Past Surgical History  Procedure Laterality Date  . Hand surgery    . Angioplasty    . Carotid endarterectomy      Left  . Bare-metal stenting      left circumflex  . Bilateral cataract surgery      Family History  Problem Relation Age of Onset  . Colon cancer Neg Hx   . Coronary artery disease Brother   . Heart attack Father   . Migraines Mother   . Breast cancer Sister   . Dementia Sister   . Renal Disease Brother     Social history:  reports that he has never smoked. He has never used smokeless tobacco. He reports that he does not drink alcohol or use illicit drugs.    Allergies  Allergen Reactions  . Augmentin [Amoxicillin-Pot Clavulanate] Nausea And Vomiting    daughter states he can tolerate Amoxicillin ok  . Fludrocortisone Acetate Other (See Comments)    unknown  . Scopace [Scopolamine] Other (See Comments)    *patch Unknown reaction    Medications:  Current Outpatient Prescriptions on File Prior to Visit  Medication Sig Dispense Refill  . acetaminophen (TYLENOL) 500 MG tablet Take 2  tablets (1,000 mg total) by mouth 2 (two) times daily. 60 tablet 11  . ALPRAZolam (XANAX) 0.25 MG tablet TAKE 1 TABLET BY MOUTH TWICE DAILY AS NEEDED* NEEDS OFFICE VISIT 30 tablet 4  . cholecalciferol (VITAMIN D) 1000 UNITS tablet Take 1,000 Units by mouth daily.    [name] DEXILANT 60 MG capsule TAKE 1 CAPSULE BY MOUTH EVERY DAY 30 capsule 11  . docusate sodium (COLACE) 100 MG capsule Take 100 mg by mouth at bedtime.    . DULoxetine (CYMBALTA) 30 MG capsule Take 1 capsule (30 mg total) by mouth daily. 30 capsule 5  . HYDROcodone-acetaminophen (NORCO) 5-325 MG per tablet Take 1-2 tablets by mouth every 6 (six) hours as needed for moderate pain. 30 tablet 0  . latanoprost (XALATAN) 0.005 % ophthalmic solution Place 1 drop into both eyes at bedtime.    . meclizine (ANTIVERT) 12.5 MG tablet TAKE 1 TABLET BY MOUTH THREE TIMES DAILY  AS NEEDED FOR DIZZINESS 30 tablet 0  . Melatonin 10 MG CAPS Take 10 mg by mouth.    . nitroGLYCERIN (NITROSTAT) 0.4 MG SL tablet Place 0.4 mg under the tongue every 5 (five) minutes as needed. Chest pain    . ondansetron (ZOFRAN) 4 MG tablet Take 4 mg by mouth every 8 (eight) hours as needed for nausea.    . polyethylene glycol powder (GLYCOLAX/MIRALAX) powder MIX 17 GRAMS WITH 4 TO 6 OUNCES OF WATER AND DRINK ONCE DAILY AS NEEDED FOR CONSTIPATION 527 g 2  . predniSONE (DELTASONE) 10 MG tablet Take 1 tablet (10 mg total) by mouth daily with breakfast. 30 tablet 3  . promethazine (PHENERGAN) 25 MG tablet Take 1 tablet (25 mg total) by mouth every 6 (six) hours as needed. nausea 30 tablet 1  . Tamsulosin HCl (FLOMAX) 0.4 MG CAPS Take 0.4 mg by mouth daily.     [name] tiZANidine (ZANAFLEX) 2 MG tablet Take 2 mg by mouth every 6 (six) hours as needed (for muscle relaxant).    [name] 15 MG TABS tablet TAKE 1 TABLET BY MOUTH EVERY DAY WITH SUPPER 30 tablet 4  . zolpidem (AMBIEN) 5 MG tablet Take 5 mg by mouth at bedtime as needed. For insomnia.     No current facility-administered medications on file prior to visit.    ROS:  Out of a complete 14 system review of symptoms, the patient complains only of the following symptoms, and all other reviewed systems are negative.  Ear pain Eye discomfort Gait disorder  Blood pressure 193/99, pulse 68, height 6' (1.829 m), weight 183 lb 6.4 oz (83.19 kg).  Physical Exam  General: The patient is alert and cooperative at the time of the examination.  Skin: No significant peripheral edema is noted.   Neurologic Exam  Mental status: The patient is oriented x 3.  Cranial nerves: Facial symmetry is present. Speech is normal, no aphasia or dysarthria is noted. Extraocular movements are full. Visual fields are full. The patient is hard of hearing.  Motor: The patient has good strength in all 4 extremities.  Sensory examination: Soft touch sensation is  symmetric on the face, arms, and legs. The patient has a stocking pattern pinprick sensory deficit up to the knees bilaterally.  Coordination: The patient has good finger-nose-finger and heel-to-shin bilaterally.  Gait and station: The patient has a wide-based, unsteady gait. The patient walks with a walker, and he has relatively good stability with this. Tandem gait was not attempted. Romberg is positive. No drift is seen.  Reflexes: Deep tendon reflexes are symmetric, but are depressed.   MRI cervical 03/30/11:  Impression: This MRI scan of the cervical spine shows central disc protrusions at C4-5 and C5-6 but without significant compression.    Assessment/Plan:  1. Peripheral neuropathy  2. Gait disorder  3. Chronic cervical spine discomfort  The patient is complaining of symptoms of discomfort in the frontal and maxillary sinuses. He will be sent for a sinus x-ray, he may require antibiotics if sinusitis is seen. The patient will try a decongestant otherwise. He will follow-up in about 6 months. The patient is to continue to be careful about using a walker on a regular basis for ambulation.  Marlan Palau. Keith Willis MD 02/04/2014 7:07 PM  [facility] [address] [place], [place] [id number]  Phone [phone] Fax [phone]

## 2014-02-04 NOTE — Patient Instructions (Signed)

## 2014-02-12 ENCOUNTER — Other Ambulatory Visit: Payer: Self-pay | Admitting: Family Medicine

## 2014-02-12 NOTE — Telephone Encounter (Signed)
Refill appropriate and filled per protocol. 

## 2014-02-19 ENCOUNTER — Other Ambulatory Visit: Payer: Self-pay | Admitting: Neurology

## 2014-03-12 ENCOUNTER — Telehealth: Payer: Self-pay | Admitting: *Deleted

## 2014-03-12 NOTE — Telephone Encounter (Signed)
Received call from patient daughter [name].   Reports that patient thinks he has a cold. Reports that patient has sinus pressure, head congestion, non-productive cough, nausea, dizziness, headaches and some abdominal pain. Reports that dizziness and abdominal pains are not new complaint.   Denies fever, chills or muscle aches.   MD please advise.

## 2014-03-12 NOTE — Telephone Encounter (Signed)
I recommend coricedan hbp for congestion and delsym and mucinex for cough.  NTBS if worsening.

## 2014-03-12 NOTE — Telephone Encounter (Signed)
Patient's dtr aware

## 2014-03-13 ENCOUNTER — Other Ambulatory Visit: Payer: Self-pay | Admitting: Family Medicine

## 2014-03-15 ENCOUNTER — Other Ambulatory Visit: Payer: Self-pay | Admitting: Cardiovascular Disease

## 2014-03-15 ENCOUNTER — Other Ambulatory Visit: Payer: Self-pay | Admitting: Family Medicine

## 2014-03-18 ENCOUNTER — Other Ambulatory Visit: Payer: Self-pay | Admitting: *Deleted

## 2014-03-18 MED ORDER — RIVAROXABAN 15 MG PO TABS: ORAL_TABLET | ORAL | Status: DC

## 2014-03-20 ENCOUNTER — Encounter: Payer: Self-pay | Admitting: Family Medicine

## 2014-03-20 ENCOUNTER — Ambulatory Visit (INDEPENDENT_AMBULATORY_CARE_PROVIDER_SITE_OTHER): Payer: 59 | Admitting: Family Medicine

## 2014-03-20 VITALS — BP 162/90 | HR 76 | Temp 98.0°F | Resp 16 | Ht 70.0 in | Wt 185.0 lb

## 2014-03-20 DIAGNOSIS — I10 Essential (primary) hypertension: Secondary | ICD-10-CM

## 2014-03-20 DIAGNOSIS — G4485 Primary stabbing headache: Secondary | ICD-10-CM

## 2014-03-20 NOTE — Progress Notes (Signed)
Subjective:    Patient ID: Jason [name], male    DOB: 03/21/1919, 81 y.o.   MRN: [id number]  HPI Since starting the patient on prednisone, the patient's blood pressure has been wildly fluctuating. It has been as high as 200/100 at the nursing home. It is elevated here substantially. Patient is also having frequent headaches that are located behind his eyes but these are also associated with sinus pressure and sinus drainage. However the headaches also occasionally have flashing lights and visual changes associated with them and nausea. There is a family history of migraines in the patient's wife and his daughter. She is concerned that these may be migraines.the headaches are pulsatile and stabbing in nature. Past Medical History  Diagnosis Date  . PVD (peripheral vascular disease)   . CAD (coronary artery disease)     a. 12/2005 - PCI to OM  . Hyperlipidemia   . Cerebrovascular accident   . DVT femoral (deep venous thrombosis) with thrombophlebitis   . GERD (gastroesophageal reflux disease)   . Edema   . Anxiety   . Vertigo   . IBS (irritable bowel syndrome)   . Duodenitis   . Gastritis   . Esophageal stricture   . Cellulitis   . Pulmonary embolism   . Prostatic hypertrophy   . Frequent falls   . Subdural hematoma   . PTSD (post-traumatic stress disorder)   . Vertigo   . TIA (transient ischemic attack)     a. 01/2006 and 05/2006.   [name] Hiatal hernia     periferal neuropathy  . Hx of echocardiogram     Echo 9/13:  Mild LVH, EF 60-65%, Gr 1 diast dysfn, mild AI, mild BAE  . Gout   . Chronic low back pain   . Gait disorder   . Peripheral neuropathy   . Hydrocele   . Migraine   . History of shingles     Left lumbar  . HOH (hard of hearing)     hearing aid   Past Surgical History  Procedure Laterality Date  . Hand surgery    . Angioplasty    . Carotid endarterectomy      Left  . Bare-metal stenting      left circumflex  . Bilateral cataract surgery     Current  Outpatient Prescriptions on File Prior to Visit  Medication Sig Dispense Refill  . acetaminophen (TYLENOL) 500 MG tablet Take 2 tablets (1,000 mg total) by mouth 2 (two) times daily. 60 tablet 11  . ALPRAZolam (XANAX) 0.25 MG tablet TAKE 1 TABLET BY MOUTH TWICE DAILY AS NEEDED* NEEDS OFFICE VISIT 30 tablet 4  . cholecalciferol (VITAMIN D) 1000 UNITS tablet Take 1,000 Units by mouth daily.    [name] DEXILANT 60 MG capsule TAKE ONE CAPSULE BY MOUTH EVERY DAY 30 capsule 11  . docusate sodium (COLACE) 100 MG capsule Take 100 mg by mouth at bedtime.    . DULoxetine (CYMBALTA) 30 MG capsule TAKE 1 CAPSULE BY MOUTH DAILY 30 capsule 6  . HYDROcodone-acetaminophen (NORCO) 5-325 MG per tablet Take 1-2 tablets by mouth every 6 (six) hours as needed for moderate pain. 30 tablet 0  . latanoprost (XALATAN) 0.005 % ophthalmic solution Place 1 drop into both eyes at bedtime.    . meclizine (ANTIVERT) 12.5 MG tablet TAKE 1 TABLET BY MOUTH THREE TIMES DAILY AS NEEDED FOR DIZZINESS 30 tablet 0  . Melatonin 10 MG CAPS Take 10 mg by mouth.    . nitroGLYCERIN (NITROSTAT)  0.4 MG SL tablet Place 0.4 mg under the tongue every 5 (five) minutes as needed. Chest pain    . ondansetron (ZOFRAN) 4 MG tablet Take 4 mg by mouth every 8 (eight) hours as needed for nausea.    . polyethylene glycol powder (GLYCOLAX/MIRALAX) powder MIX 17 GRAMS WITH 4 TO 6 OUNCES OF WATER AND DRINK ONCE DAILY AS NEEDED FOR CONSTIPATION 527 g 2  . predniSONE (DELTASONE) 10 MG tablet Take 1 tablet (10 mg total) by mouth daily with breakfast. 30 tablet 3  . promethazine (PHENERGAN) 25 MG tablet Take 1 tablet (25 mg total) by mouth every 6 (six) hours as needed. nausea 30 tablet 1  . Rivaroxaban (XARELTO) 15 MG TABS tablet TAKE 1 TABLET BY MOUTH EVERY DAY WITH SUPPER 30 tablet 4  . Tamsulosin HCl (FLOMAX) 0.4 MG CAPS Take 0.4 mg by mouth daily.     [name] tiZANidine (ZANAFLEX) 2 MG tablet Take 2 mg by mouth every 6 (six) hours as needed (for muscle relaxant).      . zolpidem (AMBIEN) 5 MG tablet Take 5 mg by mouth at bedtime as needed. For insomnia.     No current facility-administered medications on file prior to visit.   Allergies  Allergen Reactions  . Augmentin [Amoxicillin-Pot Clavulanate] Nausea And Vomiting    daughter states he can tolerate Amoxicillin ok  . Fludrocortisone Acetate Other (See Comments)    unknown  . Scopace [Scopolamine] Other (See Comments)    *patch Unknown reaction   History   Social History  . Marital Status: Married    Spouse Name: N/A    Number of Children: N/A  . Years of Education: N/A   Occupational History  . Not on file.   Social History Main Topics  . Smoking status: Never Smoker   . Smokeless tobacco: Never Used  . Alcohol Use: No     Comment: Wine occasional  . Drug Use: No  . Sexual Activity: No   Other Topics Concern  . 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      Objective:   Physical Exam  Constitutional: He is oriented to person, place, and time. He appears well-developed and well-nourished.  HENT:  Head: Normocephalic and atraumatic.  Right Ear: External ear normal.  Left Ear: External ear normal.  Nose: Nose normal.  Mouth/Throat: Oropharynx is clear and moist.  Eyes: Conjunctivae and EOM are normal. Pupils are equal, round, and reactive to light.  Neck: Neck supple.  Cardiovascular: Normal rate, regular rhythm and normal heart sounds.   Pulmonary/Chest: Effort normal and breath sounds normal. No respiratory distress. He has no wheezes. He has no rales.  Neurological: He is alert and oriented to person, place, and time. He has normal reflexes. He displays normal reflexes. No cranial nerve deficit. He exhibits normal muscle tone. Coordination normal.          Assessment & Plan:  Essential hypertension  Primary stabbing  headache  Patient's blood pressure is elevated. I will have the patient discontinue prednisone. I will have the nursing home check his blood pressure daily for 1-2 weeks. I want to see those values in 2 weeks. If his blood pressure is not improving on increased blood pressure medication at that time. If the patient's blood pressure is better and his headaches are continuing to happen I  would proceed with a CT scan of the head. If there is no evidence of sinus disease on the CT scan and no evidence of intracranial pathology, I will treat the patient for possible migraines.

## 2014-03-31 ENCOUNTER — Encounter: Payer: Self-pay | Admitting: Family Medicine

## 2014-03-31 ENCOUNTER — Ambulatory Visit
Admission: RE | Admit: 2014-03-31 | Discharge: 2014-03-31 | Disposition: A | Discharge status: Home / Self Care | Payer: Medicare Other | Source: Ambulatory Visit | Attending: Family Medicine | Admitting: Family Medicine

## 2014-03-31 ENCOUNTER — Ambulatory Visit (INDEPENDENT_AMBULATORY_CARE_PROVIDER_SITE_OTHER): Payer: 59 | Admitting: Family Medicine

## 2014-03-31 VITALS — BP 150/88 | HR 78 | Temp 97.9°F | Resp 16 | Ht 70.0 in | Wt 185.0 lb

## 2014-03-31 DIAGNOSIS — N508 Other specified disorders of male genital organs: Secondary | ICD-10-CM

## 2014-03-31 DIAGNOSIS — R1031 Right lower quadrant pain: Secondary | ICD-10-CM

## 2014-03-31 DIAGNOSIS — R1030 Lower abdominal pain, unspecified: Secondary | ICD-10-CM | POA: Diagnosis not present

## 2014-03-31 DIAGNOSIS — K449 Diaphragmatic hernia without obstruction or gangrene: Secondary | ICD-10-CM | POA: Diagnosis not present

## 2014-03-31 DIAGNOSIS — N50811 Right testicular pain: Secondary | ICD-10-CM

## 2014-03-31 LAB — CBC W/MCH & 3 PART DIFF
HCT: 43.8 % (ref 39.0–52.0)
Hemoglobin: 14.6 g/dL (ref 13.0–17.0)
Lymphocytes Relative: 17 % (ref 12–46)
Lymphs Abs: 1.4 10*3/uL (ref 0.7–4.0)
MCH: 32.8 pg (ref 26.0–34.0)
MCHC: 33.3 g/dL (ref 30.0–36.0)
MCV: 98.4 fL (ref 78.0–100.0)
Neutro Abs: 5.9 10*3/uL (ref 1.7–7.7)
Neutrophils Relative %: 72 % (ref 43–77)
Platelets: 211 10*3/uL (ref 150–400)
RBC: 4.45 MIL/uL (ref 4.22–5.81)
RDW: 16.4 % — ABNORMAL HIGH (ref 11.5–15.5)
WBC mixed population %: 11 % (ref 3–18)
WBC mixed population: 0.9 10*3/uL (ref 0.1–1.8)
WBC: 8.2 10*3/uL (ref 4.0–10.5)

## 2014-03-31 LAB — URINALYSIS, ROUTINE W REFLEX MICROSCOPIC
Bilirubin Urine: NEGATIVE
GLUCOSE, UA: NEGATIVE mg/dL
Ketones, ur: NEGATIVE mg/dL
Leukocytes, UA: NEGATIVE
NITRITE: NEGATIVE
Protein, ur: 30 mg/dL — AB
Specific Gravity, Urine: 1.02 (ref 1.005–1.030)
UROBILINOGEN UA: 1 mg/dL (ref 0.0–1.0)
pH: 6 (ref 5.0–8.0)

## 2014-03-31 LAB — URINALYSIS, MICROSCOPIC ONLY: CRYSTALS: NONE SEEN

## 2014-03-31 NOTE — Progress Notes (Signed)
Subjective:    Patient ID: Jason [name], male    DOB: 1919-12-17, 81 y.o.   MRN: [id number]  HPI Beginning Sunday, the patient developed pain in his right testicle and in the perineum. The pain was colicky in nature and would come and go. There are no exacerbating or alleviating factors. Now the pain is in the right inguinal crease. He continues to be colicky in nature. He denies any dysuria. He denies any gross hematuria. He does have chronic constipation. He denies any exacerbation of this or diarrhea or melena. He denies any fevers or chills. He does have some intermittent nausea but no vomiting. The nausea is also chronic. On examination today there is no tenderness to palpation in the right lower quadrant.  In fact there is no tenderness to palpation in any of the 4 abdominal quadrants. There is no palpable inguinal hernia. There is no testicular swelling or mass or tenderness on exam. There is a 1 cm hard nodule which is adjacent to the right testicle in the spermatic cord but this is not tender and does not seem to be a source of the pain. There is no rash in that area. Past Medical History  Diagnosis Date  . PVD (peripheral vascular disease)   . CAD (coronary artery disease)     a. 12/2005 - PCI to OM  . Hyperlipidemia   . Cerebrovascular accident   . DVT femoral (deep venous thrombosis) with thrombophlebitis   . GERD (gastroesophageal reflux disease)   . Edema   . Anxiety   . Vertigo   . IBS (irritable bowel syndrome)   . Duodenitis   . Gastritis   . Esophageal stricture   . Cellulitis   . Pulmonary embolism   . Prostatic hypertrophy   . Frequent falls   . Subdural hematoma   . PTSD (post-traumatic stress disorder)   . Vertigo   . TIA (transient ischemic attack)     a. 01/2006 and 05/2006.   [name] Hiatal hernia     periferal neuropathy  . Hx of echocardiogram     Echo 9/13:  Mild LVH, EF 60-65%, Gr 1 diast dysfn, mild AI, mild BAE  . Gout   . Chronic low back pain   . Gait  disorder   . Peripheral neuropathy   . Hydrocele   . Migraine   . History of shingles     Left lumbar  . HOH (hard of hearing)     hearing aid   Past Surgical History  Procedure Laterality Date  . Hand surgery    . Angioplasty    . Carotid endarterectomy      Left  . Bare-metal stenting      left circumflex  . Bilateral cataract surgery     Current Outpatient Prescriptions on File Prior to Visit  Medication Sig Dispense Refill  . acetaminophen (TYLENOL) 500 MG tablet Take 2 tablets (1,000 mg total) by mouth 2 (two) times daily. 60 tablet 11  . ALPRAZolam (XANAX) 0.25 MG tablet TAKE 1 TABLET BY MOUTH TWICE DAILY AS NEEDED* NEEDS OFFICE VISIT 30 tablet 4  . cholecalciferol (VITAMIN D) 1000 UNITS tablet Take 1,000 Units by mouth daily.    [name] DEXILANT 60 MG capsule TAKE ONE CAPSULE BY MOUTH EVERY DAY 30 capsule 11  . docusate sodium (COLACE) 100 MG capsule Take 100 mg by mouth at bedtime.    . DULoxetine (CYMBALTA) 30 MG capsule TAKE 1 CAPSULE BY MOUTH DAILY 30 capsule 6  .  HYDROcodone-acetaminophen (NORCO) 5-325 MG per tablet Take 1-2 tablets by mouth every 6 (six) hours as needed for moderate pain. 30 tablet 0  . meclizine (ANTIVERT) 12.5 MG tablet TAKE 1 TABLET BY MOUTH THREE TIMES DAILY AS NEEDED FOR DIZZINESS 30 tablet 0  . nitroGLYCERIN (NITROSTAT) 0.4 MG SL tablet Place 0.4 mg under the tongue every 5 (five) minutes as needed. Chest pain    . ondansetron (ZOFRAN) 4 MG tablet Take 4 mg by mouth every 8 (eight) hours as needed for nausea.    . polyethylene glycol powder (GLYCOLAX/MIRALAX) powder MIX 17 GRAMS WITH 4 TO 6 OUNCES OF WATER AND DRINK ONCE DAILY AS NEEDED FOR CONSTIPATION 527 g 2  . promethazine (PHENERGAN) 25 MG tablet Take 1 tablet (25 mg total) by mouth every 6 (six) hours as needed. nausea 30 tablet 1  . Rivaroxaban (XARELTO) 15 MG TABS tablet TAKE 1 TABLET BY MOUTH EVERY DAY WITH SUPPER 30 tablet 4  . Tamsulosin HCl (FLOMAX) 0.4 MG CAPS Take 0.4 mg by mouth daily.      [name] tiZANidine (ZANAFLEX) 2 MG tablet Take 2 mg by mouth every 6 (six) hours as needed (for muscle relaxant).    . zolpidem (AMBIEN) 5 MG tablet Take 5 mg by mouth at bedtime as needed. For insomnia.    [name] latanoprost (XALATAN) 0.005 % ophthalmic solution Place 1 drop into both eyes at bedtime.    . Melatonin 10 MG CAPS Take 10 mg by mouth.    . predniSONE (DELTASONE) 10 MG tablet Take 1 tablet (10 mg total) by mouth daily with breakfast. (Patient not taking: Reported on 03/31/2014) 30 tablet 3   No current facility-administered medications on file prior to visit.   Allergies  Allergen Reactions  . Augmentin [Amoxicillin-Pot Clavulanate] Nausea And Vomiting    daughter states he can tolerate Amoxicillin ok  . Fludrocortisone Acetate Other (See Comments)    unknown  . Scopace [Scopolamine] Other (See Comments)    *patch Unknown reaction   History   Social History  . Marital Status: Married    Spouse Name: N/A    Number of Children: N/A  . Years of Education: N/A   Occupational History  . Not on file.   Social History Main Topics  . Smoking status: Never Smoker   . Smokeless tobacco: Never Used  . Alcohol Use: No     Comment: Wine occasional  . Drug Use: No  . Sexual Activity: No   Other Topics Concern  . 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      Objective:   Physical Exam  Constitutional: He appears well-developed and well-nourished. No distress.  Cardiovascular: Normal rate, regular rhythm and normal heart sounds.   Pulmonary/Chest: Effort normal and breath sounds normal. No respiratory distress. He has no wheezes. He has no rales.  Abdominal: Soft. Bowel sounds are normal. He exhibits no distension and no mass. There is no tenderness. There is no rebound and no guarding. Hernia confirmed negative in the right inguinal area and  confirmed negative in the left inguinal area.  Genitourinary: Penis normal. Right testis shows no swelling and no tenderness. Left testis shows no swelling and no tenderness. Circumcised.  Lymphadenopathy:       Right: No inguinal adenopathy present.       Left: No inguinal adenopathy present.  Skin: He is not diaphoretic.  Vitals reviewed.         Assessment & Plan:  Pain in right testicle - Plan: Urinalysis, Routine w reflex microscopic  RLQ abdominal pain  There is no evidence of an acute abdomen today on examination. Patient's abdomen is soft, nondistended, nontender, with no guarding.  Differential diagnosis includes strained muscle, kidney stone, constipation, appendicitis, urinary tract infection, prostatitis.  Urinalysis shows no evidence of urinary tract infection. Symptoms are not consistent with prostate infection. White blood cell count makes atypical appendicitis very unlikely. I believe this is either some type of muscle strain constipation or possibly a kidney stone. Urinalysis shows trace amount of blood. I believe the most likely explanation is a kidney stone. Therefore I will send the patient for a CT scan to evaluate for a kidney stone without contrast.

## 2014-04-08 ENCOUNTER — Ambulatory Visit: Payer: Medicare Other | Admitting: Cardiovascular Disease

## 2014-04-10 ENCOUNTER — Telehealth: Payer: Self-pay | Admitting: Family Medicine

## 2014-04-10 ENCOUNTER — Ambulatory Visit (INDEPENDENT_AMBULATORY_CARE_PROVIDER_SITE_OTHER): Payer: 59 | Admitting: Family Medicine

## 2014-04-10 ENCOUNTER — Encounter: Payer: Self-pay | Admitting: Family Medicine

## 2014-04-10 VITALS — BP 160/100 | HR 80 | Temp 98.2°F | Resp 16

## 2014-04-10 DIAGNOSIS — J340 Abscess, furuncle and carbuncle of nose: Secondary | ICD-10-CM

## 2014-04-10 MED ORDER — SULFAMETHOXAZOLE-TRIMETHOPRIM 800-160 MG PO TABS: 1.0000 | ORAL_TABLET | Freq: Two times a day (BID) | ORAL | Status: DC

## 2014-04-10 NOTE — Telephone Encounter (Signed)
Patients daughter [name] calling to speak to you regarding a fever he is running please call back at [phone]

## 2014-04-10 NOTE — Telephone Encounter (Signed)
NTBS immediately and will likely prescribe abx.  Tell them to comeby now.

## 2014-04-10 NOTE — Telephone Encounter (Signed)
Per provider, have pt come to office to be seen today.

## 2014-04-10 NOTE — Telephone Encounter (Signed)
Hot swollen infection inside right nostril.  Is running a fever and eye swollen also.  States called here Wednesday and was told to put hot compresses on it and take antiinflammatory.  Much worse today.  What do you want them to do??

## 2014-04-10 NOTE — Telephone Encounter (Signed)
See second phone note

## 2014-04-10 NOTE — Progress Notes (Signed)
Subjective:    Patient ID: Jason [name], male    DOB: 04/30/1919, 81 y.o.   MRN: [id number]  HPI Patient is a very pleasant 81 year old white male who presents today with 2-3 days of swelling around his right nostril. The right side of his nose is extremely erythematous warm and tender. The swelling is beginning to track onto his right cheek. Inside the right nostril there is a visible oral that is approximately 8 mm in size. His entire right side of his nose is extremely tender to palpation. Past Medical History  Diagnosis Date  . PVD (peripheral vascular disease)   . CAD (coronary artery disease)     a. 12/2005 - PCI to OM  . Hyperlipidemia   . Cerebrovascular accident   . DVT femoral (deep venous thrombosis) with thrombophlebitis   . GERD (gastroesophageal reflux disease)   . Edema   . Anxiety   . Vertigo   . IBS (irritable bowel syndrome)   . Duodenitis   . Gastritis   . Esophageal stricture   . Cellulitis   . Pulmonary embolism   . Prostatic hypertrophy   . Frequent falls   . Subdural hematoma   . PTSD (post-traumatic stress disorder)   . Vertigo   . TIA (transient ischemic attack)     a. 01/2006 and 05/2006.   [name]. Hiatal hernia     periferal neuropathy  . Hx of echocardiogram     Echo 9/13:  Mild LVH, EF 60-65%, Gr 1 diast dysfn, mild AI, mild BAE  . Gout   . Chronic low back pain   . Gait disorder   . Peripheral neuropathy   . Hydrocele   . Migraine   . History of shingles     Left lumbar  . HOH (hard of hearing)     hearing aid   Past Surgical History  Procedure Laterality Date  . Hand surgery    . Angioplasty    . Carotid endarterectomy      Left  . Bare-metal stenting      left circumflex  . Bilateral cataract surgery     Current Outpatient Prescriptions on File Prior to Visit  Medication Sig Dispense Refill  . acetaminophen (TYLENOL) 500 MG tablet Take 2 tablets (1,000 mg total) by mouth 2 (two) times daily. 60 tablet 11  . ALPRAZolam (XANAX) 0.25  MG tablet TAKE 1 TABLET BY MOUTH TWICE DAILY AS NEEDED* NEEDS OFFICE VISIT 30 tablet 4  . cholecalciferol (VITAMIN D) 1000 UNITS tablet Take 1,000 Units by mouth daily.    [name]. DEXILANT 60 MG capsule TAKE ONE CAPSULE BY MOUTH EVERY DAY 30 capsule 11  . docusate sodium (COLACE) 100 MG capsule Take 100 mg by mouth at bedtime.    . DULoxetine (CYMBALTA) 30 MG capsule TAKE 1 CAPSULE BY MOUTH DAILY 30 capsule 6  . HYDROcodone-acetaminophen (NORCO) 5-325 MG per tablet Take 1-2 tablets by mouth every 6 (six) hours as needed for moderate pain. 30 tablet 0  . latanoprost (XALATAN) 0.005 % ophthalmic solution Place 1 drop into both eyes at bedtime.    . meclizine (ANTIVERT) 12.5 MG tablet TAKE 1 TABLET BY MOUTH THREE TIMES DAILY AS NEEDED FOR DIZZINESS 30 tablet 0  . Melatonin 10 MG CAPS Take 10 mg by mouth.    . nitroGLYCERIN (NITROSTAT) 0.4 MG SL tablet Place 0.4 mg under the tongue every 5 (five) minutes as needed. Chest pain    . ondansetron (ZOFRAN) 4 MG tablet Take  4 mg by mouth every 8 (eight) hours as needed for nausea.    . polyethylene glycol powder (GLYCOLAX/MIRALAX) powder MIX 17 GRAMS WITH 4 TO 6 OUNCES OF WATER AND DRINK ONCE DAILY AS NEEDED FOR CONSTIPATION 527 g 2  . predniSONE (DELTASONE) 10 MG tablet Take 1 tablet (10 mg total) by mouth daily with breakfast. 30 tablet 3  . promethazine (PHENERGAN) 25 MG tablet Take 1 tablet (25 mg total) by mouth every 6 (six) hours as needed. nausea 30 tablet 1  . Rivaroxaban (XARELTO) 15 MG TABS tablet TAKE 1 TABLET BY MOUTH EVERY DAY WITH SUPPER 30 tablet 4  . Tamsulosin HCl (FLOMAX) 0.4 MG CAPS Take 0.4 mg by mouth daily.     [name] tiZANidine (ZANAFLEX) 2 MG tablet Take 2 mg by mouth every 6 (six) hours as needed (for muscle relaxant).    . zolpidem (AMBIEN) 5 MG tablet Take 5 mg by mouth at bedtime as needed. For insomnia.     No current facility-administered medications on file prior to visit.   Allergies  Allergen Reactions  . Augmentin  [Amoxicillin-Pot Clavulanate] Nausea And Vomiting    daughter states he can tolerate Amoxicillin ok  . Fludrocortisone Acetate Other (See Comments)    unknown  . Scopace [Scopolamine] Other (See Comments)    *patch Unknown reaction   History   Social History  . Marital Status: Married    Spouse Name: N/A    Number of Children: N/A  . Years of Education: N/A   Occupational History  . Not on file.   Social History Main Topics  . Smoking status: Never Smoker   . Smokeless tobacco: Never Used  . Alcohol Use: No     Comment: Wine occasional  . Drug Use: No  . Sexual Activity: No   Other Topics Concern  . 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      Objective:   Physical Exam  Cardiovascular: Normal rate and regular rhythm.   Pulmonary/Chest: Effort normal and breath sounds normal.  Skin: Skin is warm. There is erythema.  Vitals reviewed.  entire nose is erythematous warm swollen and tender. There is an 8mm abscess inside the right nostril on the lateral wall        Assessment & Plan:  Abscess of nose - Plan: Culture, routine-abscess  I anesthetized the lesion is best I could with 0.1% lidocaine without epinephrine. I made a 4 mm incision into the boil and expressed purulent material.  A wound culture was sent to the lab. I drained the lesion is best I could. The patient was immediately started on Bactrim double strength tablets 1 by mouth twice a day. I will check the patient again on Monday. He is to go to the hospital immediately if symptoms worsen.[name]

## 2014-04-13 ENCOUNTER — Encounter: Payer: Self-pay | Admitting: Family Medicine

## 2014-04-13 ENCOUNTER — Ambulatory Visit (INDEPENDENT_AMBULATORY_CARE_PROVIDER_SITE_OTHER): Payer: 59 | Admitting: Family Medicine

## 2014-04-13 VITALS — BP 180/92 | HR 86 | Temp 98.1°F | Resp 16 | Ht 70.0 in

## 2014-04-13 DIAGNOSIS — L0291 Cutaneous abscess, unspecified: Secondary | ICD-10-CM

## 2014-04-13 DIAGNOSIS — I10 Essential (primary) hypertension: Secondary | ICD-10-CM

## 2014-04-13 DIAGNOSIS — L0390 Cellulitis, unspecified: Secondary | ICD-10-CM

## 2014-04-13 LAB — CULTURE, ROUTINE-ABSCESS
GRAM STAIN: NONE SEEN
Gram Stain: NONE SEEN

## 2014-04-13 MED ORDER — ATENOLOL 50 MG PO TABS: 50.0000 mg | ORAL_TABLET | Freq: Every day | ORAL | Status: DC

## 2014-04-13 NOTE — Progress Notes (Signed)
Subjective:    Patient ID: Jason [name], male    DOB: 04/03/1919, 81 y.o.   MRN: [id number]  HPI 04/10/14 Patient is a very pleasant 81 year old white male who presents today with 2-3 days of swelling around his right nostril. The right side of his nose is extremely erythematous warm and tender. The swelling is beginning to track onto his right cheek. Inside the right nostril there is a visible oral that is approximately 8 mm in size. His entire right side of his nose is extremely tender to palpation.  At that time, my plan was: I anesthetized the lesion is best I could with 0.1% lidocaine without epinephrine. I made a 4 mm incision into the boil and expressed purulent material.  A wound culture was sent to the lab. I drained the lesion is best I could. The patient was immediately started on Bactrim double strength tablets 1 by mouth twice a day. I will check the patient again on Monday. He is to go to the hospital immediately if symptoms worsen..  04/13/14 Patient is here today for recheck. The swelling is markedly better in his nose. The erythema is much better. On examination of the right nostril there is a large clot of blood and pus and dried nasal mucus at the tip of his nose. I removed out of the forceps without complication. Otherwise the erythema is subsiding. He is at least 60% better. Past Medical History  Diagnosis Date  . PVD (peripheral vascular disease)   . CAD (coronary artery disease)     a. 12/2005 - PCI to OM  . Hyperlipidemia   . Cerebrovascular accident   . DVT femoral (deep venous thrombosis) with thrombophlebitis   . GERD (gastroesophageal reflux disease)   . Edema   . Anxiety   . Vertigo   . IBS (irritable bowel syndrome)   . Duodenitis   . Gastritis   . Esophageal stricture   . Cellulitis   . Pulmonary embolism   . Prostatic hypertrophy   . Frequent falls   . Subdural hematoma   . PTSD (post-traumatic stress disorder)   . Vertigo   . TIA (transient ischemic  attack)     a. 01/2006 and 05/2006.   [name]. Hiatal hernia     periferal neuropathy  . Hx of echocardiogram     Echo 9/13:  Mild LVH, EF 60-65%, Gr 1 diast dysfn, mild AI, mild BAE  . Gout   . Chronic low back pain   . Gait disorder   . Peripheral neuropathy   . Hydrocele   . Migraine   . History of shingles     Left lumbar  . HOH (hard of hearing)     hearing aid   Past Surgical History  Procedure Laterality Date  . Hand surgery    . Angioplasty    . Carotid endarterectomy      Left  . Bare-metal stenting      left circumflex  . Bilateral cataract surgery     Current Outpatient Prescriptions on File Prior to Visit  Medication Sig Dispense Refill  . acetaminophen (TYLENOL) 500 MG tablet Take 2 tablets (1,000 mg total) by mouth 2 (two) times daily. 60 tablet 11  . ALPRAZolam (XANAX) 0.25 MG tablet TAKE 1 TABLET BY MOUTH TWICE DAILY AS NEEDED* NEEDS OFFICE VISIT 30 tablet 4  . cholecalciferol (VITAMIN D) 1000 UNITS tablet Take 1,000 Units by mouth daily.    [name]. DEXILANT 60 MG capsule TAKE ONE  CAPSULE BY MOUTH EVERY DAY 30 capsule 11  . docusate sodium (COLACE) 100 MG capsule Take 100 mg by mouth at bedtime.    . DULoxetine (CYMBALTA) 30 MG capsule TAKE 1 CAPSULE BY MOUTH DAILY 30 capsule 6  . HYDROcodone-acetaminophen (NORCO) 5-325 MG per tablet Take 1-2 tablets by mouth every 6 (six) hours as needed for moderate pain. 30 tablet 0  . latanoprost (XALATAN) 0.005 % ophthalmic solution Place 1 drop into both eyes at bedtime.    . meclizine (ANTIVERT) 12.5 MG tablet TAKE 1 TABLET BY MOUTH THREE TIMES DAILY AS NEEDED FOR DIZZINESS 30 tablet 0  . Melatonin 10 MG CAPS Take 10 mg by mouth.    . nitroGLYCERIN (NITROSTAT) 0.4 MG SL tablet Place 0.4 mg under the tongue every 5 (five) minutes as needed. Chest pain    . ondansetron (ZOFRAN) 4 MG tablet Take 4 mg by mouth every 8 (eight) hours as needed for nausea.    . polyethylene glycol powder (GLYCOLAX/MIRALAX) powder MIX 17 GRAMS WITH 4 TO 6  OUNCES OF WATER AND DRINK ONCE DAILY AS NEEDED FOR CONSTIPATION 527 g 2  . predniSONE (DELTASONE) 10 MG tablet Take 1 tablet (10 mg total) by mouth daily with breakfast. 30 tablet 3  . promethazine (PHENERGAN) 25 MG tablet Take 1 tablet (25 mg total) by mouth every 6 (six) hours as needed. nausea 30 tablet 1  . Rivaroxaban (XARELTO) 15 MG TABS tablet TAKE 1 TABLET BY MOUTH EVERY DAY WITH SUPPER 30 tablet 4  . sulfamethoxazole-trimethoprim (BACTRIM DS,SEPTRA DS) 800-160 MG per tablet Take 1 tablet by mouth 2 (two) times daily. 20 tablet 0  . Tamsulosin HCl (FLOMAX) 0.4 MG CAPS Take 0.4 mg by mouth daily.     [name] tiZANidine (ZANAFLEX) 2 MG tablet Take 2 mg by mouth every 6 (six) hours as needed (for muscle relaxant).    . zolpidem (AMBIEN) 5 MG tablet Take 5 mg by mouth at bedtime as needed. For insomnia.     No current facility-administered medications on file prior to visit.   Allergies  Allergen Reactions  . Augmentin [Amoxicillin-Pot Clavulanate] Nausea And Vomiting    daughter states he can tolerate Amoxicillin ok  . Fludrocortisone Acetate Other (See Comments)    unknown  . Scopace [Scopolamine] Other (See Comments)    *patch Unknown reaction   History   Social History  . Marital Status: Married    Spouse Name: N/A    Number of Children: N/A  . Years of Education: N/A   Occupational History  . Not on file.   Social History Main Topics  . Smoking status: Never Smoker   . Smokeless tobacco: Never Used  . Alcohol Use: No     Comment: Wine occasional  . Drug Use: No  . Sexual Activity: No   Other Topics Concern  . 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      Objective:   Physical Exam  Cardiovascular: Normal rate and regular rhythm.   Pulmonary/Chest: Effort normal and breath sounds normal.  Skin: Skin is warm. There is erythema.    Vitals reviewed.        Assessment & Plan:  Benign essential HTN - Plan: atenolol (TENORMIN) 50 MG tablet  Cellulitis and abscess  Blood pressures elevated. I will add atenolol 50 mg by mouth daily  to help manage his blood pressure. Recheck in one month.  Cellulitis is improving rapidly. Complete Bactrim. Recheck in one week if not 100% better.

## 2014-04-14 ENCOUNTER — Telehealth: Payer: Self-pay | Admitting: Family Medicine

## 2014-04-14 NOTE — Telephone Encounter (Signed)
Pt c/o severe weakness.  BP 110/60, which she says is low for him.  Is this from infection or meds.  Please advise?

## 2014-04-14 NOTE — Telephone Encounter (Signed)
Likely meds, I would hold the new bp med given yesterday until he feels better.

## 2014-04-15 ENCOUNTER — Encounter: Payer: Self-pay | Admitting: Family Medicine

## 2014-04-15 NOTE — Telephone Encounter (Signed)
Spoke to [facility] at [facility].  They never received an order for the Atenolol, so pt has yet to take.  Asked that we send them a written order for their records.  [facility] They stated daughter usually picks up is meds and brings them to the home.  Will sent written order from provider for the Atenolol.  Do not start until after patient completes antibiotic.  Letter written to home with provider instructions and [facility] [place] [facility] at [phone]

## 2014-04-15 NOTE — Telephone Encounter (Signed)
Jason [name] tried to call the [facility].  No one there able to help me or knew what was going on with Jason [name].  Melissa will be back soon, they will have her call me back

## 2014-05-19 ENCOUNTER — Encounter: Payer: Self-pay | Admitting: Family Medicine

## 2014-05-19 ENCOUNTER — Ambulatory Visit (INDEPENDENT_AMBULATORY_CARE_PROVIDER_SITE_OTHER): Payer: Medicare Other | Admitting: Family Medicine

## 2014-05-19 VITALS — BP 140/78 | HR 76 | Temp 98.0°F | Resp 18 | Ht 70.0 in | Wt 184.0 lb

## 2014-05-19 DIAGNOSIS — J069 Acute upper respiratory infection, unspecified: Secondary | ICD-10-CM | POA: Diagnosis not present

## 2014-05-19 MED ORDER — BENZONATATE 100 MG PO CAPS: 200.0000 mg | ORAL_CAPSULE | Freq: Three times a day (TID) | ORAL | Status: DC | PRN

## 2014-05-19 NOTE — Progress Notes (Signed)
Subjective:    Patient ID: EVEN BUDLONG, male    DOB: 1919-11-14, 81 y.o.   MRN: [id number]  HPI Patient symptoms began yesterday afternoon and consist of runny nose, postnasal drip and a dry nonproductive cough. He denies any fevers or chills. He denies any new shortness of breath. He denies any chest pain. He denies any hemoptysis. He denies any otalgia, sore throat, or sinus pain. Past Medical History  Diagnosis Date  . PVD (peripheral vascular disease)   . CAD (coronary artery disease)     a. 12/2005 - PCI to OM  . Hyperlipidemia   . Cerebrovascular accident   . DVT femoral (deep venous thrombosis) with thrombophlebitis   . GERD (gastroesophageal reflux disease)   . Edema   . Anxiety   . Vertigo   . IBS (irritable bowel syndrome)   . Duodenitis   . Gastritis   . Esophageal stricture   . Cellulitis   . Pulmonary embolism   . Prostatic hypertrophy   . Frequent falls   . Subdural hematoma   . PTSD (post-traumatic stress disorder)   . Vertigo   . TIA (transient ischemic attack)     a. 01/2006 and 05/2006.   [name] Hiatal hernia     periferal neuropathy  . Hx of echocardiogram     Echo 9/13:  Mild LVH, EF 60-65%, Gr 1 diast dysfn, mild AI, mild BAE  . Gout   . Chronic low back pain   . Gait disorder   . Peripheral neuropathy   . Hydrocele   . Migraine   . History of shingles     Left lumbar  . HOH (hard of hearing)     hearing aid   Past Surgical History  Procedure Laterality Date  . Hand surgery    . Angioplasty    . Carotid endarterectomy      Left  . Bare-metal stenting      left circumflex  . Bilateral cataract surgery     Current Outpatient Prescriptions on File Prior to Visit  Medication Sig Dispense Refill  . acetaminophen (TYLENOL) 500 MG tablet Take 2 tablets (1,000 mg total) by mouth 2 (two) times daily. 60 tablet 11  . ALPRAZolam (XANAX) 0.25 MG tablet TAKE 1 TABLET BY MOUTH TWICE DAILY AS NEEDED* NEEDS OFFICE VISIT 30 tablet 4  . atenolol (TENORMIN)  50 MG tablet Take 1 tablet (50 mg total) by mouth daily. 90 tablet 3  . cholecalciferol (VITAMIN D) 1000 UNITS tablet Take 1,000 Units by mouth daily.    [name] DEXILANT 60 MG capsule TAKE ONE CAPSULE BY MOUTH EVERY DAY 30 capsule 11  . docusate sodium (COLACE) 100 MG capsule Take 100 mg by mouth at bedtime.    . DULoxetine (CYMBALTA) 30 MG capsule TAKE 1 CAPSULE BY MOUTH DAILY 30 capsule 6  . HYDROcodone-acetaminophen (NORCO) 5-325 MG per tablet Take 1-2 tablets by mouth every 6 (six) hours as needed for moderate pain. 30 tablet 0  . latanoprost (XALATAN) 0.005 % ophthalmic solution Place 1 drop into both eyes at bedtime.    . meclizine (ANTIVERT) 12.5 MG tablet TAKE 1 TABLET BY MOUTH THREE TIMES DAILY AS NEEDED FOR DIZZINESS 30 tablet 0  . Melatonin 10 MG CAPS Take 10 mg by mouth.    . nitroGLYCERIN (NITROSTAT) 0.4 MG SL tablet Place 0.4 mg under the tongue every 5 (five) minutes as needed. Chest pain    . ondansetron (ZOFRAN) 4 MG tablet Take 4 mg by  mouth every 8 (eight) hours as needed for nausea.    . polyethylene glycol powder (GLYCOLAX/MIRALAX) powder MIX 17 GRAMS WITH 4 TO 6 OUNCES OF WATER AND DRINK ONCE DAILY AS NEEDED FOR CONSTIPATION 527 g 2  . predniSONE (DELTASONE) 10 MG tablet Take 1 tablet (10 mg total) by mouth daily with breakfast. 30 tablet 3  . promethazine (PHENERGAN) 25 MG tablet Take 1 tablet (25 mg total) by mouth every 6 (six) hours as needed. nausea 30 tablet 1  . Rivaroxaban (XARELTO) 15 MG TABS tablet TAKE 1 TABLET BY MOUTH EVERY DAY WITH SUPPER 30 tablet 4  . Tamsulosin HCl (FLOMAX) 0.4 MG CAPS Take 0.4 mg by mouth daily.     [name]. tiZANidine (ZANAFLEX) 2 MG tablet Take 2 mg by mouth every 6 (six) hours as needed (for muscle relaxant).    . zolpidem (AMBIEN) 5 MG tablet Take 5 mg by mouth at bedtime as needed. For insomnia.     No current facility-administered medications on file prior to visit.   Allergies  Allergen Reactions  . Augmentin [Amoxicillin-Pot Clavulanate]  Nausea And Vomiting    daughter states he can tolerate Amoxicillin ok  . Fludrocortisone Acetate Other (See Comments)    unknown  . Scopace [Scopolamine] Other (See Comments)    *patch Unknown reaction   History   Social History  . Marital Status: Married    Spouse Name: N/A  . Number of Children: N/A  . Years of Education: N/A   Occupational History  . Not on file.   Social History Main Topics  . Smoking status: Never Smoker   . Smokeless tobacco: Never Used  . Alcohol Use: No     Comment: Wine occasional  . Drug Use: No  . Sexual Activity: No   Other Topics Concern  . 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      Objective:   Physical Exam  Constitutional: He appears well-developed and well-nourished.  HENT:  Right Ear: External ear normal.  Left Ear: External ear normal.  Nose: Nose normal.  Mouth/Throat: Oropharynx is clear and moist. No oropharyngeal exudate.  Eyes: Conjunctivae and EOM are normal. Pupils are equal, round, and reactive to light.  Neck: Neck supple. No JVD present.  Cardiovascular: Normal rate, regular rhythm and normal heart sounds.   Pulmonary/Chest: Effort normal and breath sounds normal. No respiratory distress. He has no wheezes. He has no rales.  Lymphadenopathy:    He has no cervical adenopathy.  Vitals reviewed.         Assessment & Plan:  URI (upper respiratory infection) - Plan: benzonatate (TESSALON PERLES) 100 MG capsule  Patient appears to have a viral upper respiratory infection. I recommended tincture of time. He can use Coricidin HBP for congestion. He can also use Tessalon Perles 100-200 mg every 8 hours as needed for coughing.

## 2014-05-21 ENCOUNTER — Emergency Department (HOSPITAL_COMMUNITY): Payer: Medicare Other

## 2014-05-21 ENCOUNTER — Telehealth: Payer: Self-pay | Admitting: Family Medicine

## 2014-05-21 ENCOUNTER — Emergency Department (HOSPITAL_COMMUNITY)
Admission: EM | Admit: 2014-05-21 | Discharge: 2014-05-22 | Disposition: A | Discharge status: Home / Self Care | Payer: Medicare Other | Source: Home / Self Care | Attending: Emergency Medicine | Admitting: Emergency Medicine

## 2014-05-21 ENCOUNTER — Encounter (HOSPITAL_COMMUNITY): Payer: Self-pay | Admitting: Emergency Medicine

## 2014-05-21 ENCOUNTER — Other Ambulatory Visit: Payer: Self-pay

## 2014-05-21 DIAGNOSIS — Z7901 Long term (current) use of anticoagulants: Secondary | ICD-10-CM

## 2014-05-21 DIAGNOSIS — F4310 Post-traumatic stress disorder, unspecified: Secondary | ICD-10-CM | POA: Diagnosis present

## 2014-05-21 DIAGNOSIS — F419 Anxiety disorder, unspecified: Secondary | ICD-10-CM

## 2014-05-21 DIAGNOSIS — K219 Gastro-esophageal reflux disease without esophagitis: Secondary | ICD-10-CM

## 2014-05-21 DIAGNOSIS — R0902 Hypoxemia: Secondary | ICD-10-CM | POA: Diagnosis present

## 2014-05-21 DIAGNOSIS — I2510 Atherosclerotic heart disease of native coronary artery without angina pectoris: Secondary | ICD-10-CM | POA: Diagnosis present

## 2014-05-21 DIAGNOSIS — N401 Enlarged prostate with lower urinary tract symptoms: Secondary | ICD-10-CM | POA: Diagnosis present

## 2014-05-21 DIAGNOSIS — G43909 Migraine, unspecified, not intractable, without status migrainosus: Secondary | ICD-10-CM | POA: Diagnosis not present

## 2014-05-21 DIAGNOSIS — G8929 Other chronic pain: Secondary | ICD-10-CM

## 2014-05-21 DIAGNOSIS — Z86711 Personal history of pulmonary embolism: Secondary | ICD-10-CM

## 2014-05-21 DIAGNOSIS — M545 Low back pain: Secondary | ICD-10-CM | POA: Diagnosis present

## 2014-05-21 DIAGNOSIS — S61511A Laceration without foreign body of right wrist, initial encounter: Secondary | ICD-10-CM | POA: Diagnosis not present

## 2014-05-21 DIAGNOSIS — J159 Unspecified bacterial pneumonia: Secondary | ICD-10-CM

## 2014-05-21 DIAGNOSIS — R296 Repeated falls: Secondary | ICD-10-CM | POA: Diagnosis present

## 2014-05-21 DIAGNOSIS — M109 Gout, unspecified: Secondary | ICD-10-CM

## 2014-05-21 DIAGNOSIS — Z803 Family history of malignant neoplasm of breast: Secondary | ICD-10-CM

## 2014-05-21 DIAGNOSIS — J208 Acute bronchitis due to other specified organisms: Secondary | ICD-10-CM | POA: Diagnosis present

## 2014-05-21 DIAGNOSIS — Y998 Other external cause status: Secondary | ICD-10-CM | POA: Insufficient documentation

## 2014-05-21 DIAGNOSIS — N400 Enlarged prostate without lower urinary tract symptoms: Secondary | ICD-10-CM | POA: Insufficient documentation

## 2014-05-21 DIAGNOSIS — G629 Polyneuropathy, unspecified: Secondary | ICD-10-CM

## 2014-05-21 DIAGNOSIS — Z8249 Family history of ischemic heart disease and other diseases of the circulatory system: Secondary | ICD-10-CM

## 2014-05-21 DIAGNOSIS — Z79899 Other long term (current) drug therapy: Secondary | ICD-10-CM

## 2014-05-21 DIAGNOSIS — W1839XA Other fall on same level, initial encounter: Secondary | ICD-10-CM | POA: Insufficient documentation

## 2014-05-21 DIAGNOSIS — S6991XA Unspecified injury of right wrist, hand and finger(s), initial encounter: Secondary | ICD-10-CM | POA: Insufficient documentation

## 2014-05-21 DIAGNOSIS — Z8673 Personal history of transient ischemic attack (TIA), and cerebral infarction without residual deficits: Secondary | ICD-10-CM

## 2014-05-21 DIAGNOSIS — I248 Other forms of acute ischemic heart disease: Secondary | ICD-10-CM | POA: Diagnosis present

## 2014-05-21 DIAGNOSIS — E860 Dehydration: Secondary | ICD-10-CM | POA: Diagnosis present

## 2014-05-21 DIAGNOSIS — Z9861 Coronary angioplasty status: Secondary | ICD-10-CM

## 2014-05-21 DIAGNOSIS — D6859 Other primary thrombophilia: Secondary | ICD-10-CM | POA: Diagnosis present

## 2014-05-21 DIAGNOSIS — D696 Thrombocytopenia, unspecified: Secondary | ICD-10-CM | POA: Diagnosis present

## 2014-05-21 DIAGNOSIS — Y9389 Activity, other specified: Secondary | ICD-10-CM

## 2014-05-21 DIAGNOSIS — Z872 Personal history of diseases of the skin and subcutaneous tissue: Secondary | ICD-10-CM

## 2014-05-21 DIAGNOSIS — J069 Acute upper respiratory infection, unspecified: Secondary | ICD-10-CM | POA: Diagnosis present

## 2014-05-21 DIAGNOSIS — I951 Orthostatic hypotension: Principal | ICD-10-CM | POA: Diagnosis present

## 2014-05-21 DIAGNOSIS — Y92128 Other place in nursing home as the place of occurrence of the external cause: Secondary | ICD-10-CM

## 2014-05-21 DIAGNOSIS — I4510 Unspecified right bundle-branch block: Secondary | ICD-10-CM | POA: Diagnosis present

## 2014-05-21 DIAGNOSIS — W19XXXA Unspecified fall, initial encounter: Secondary | ICD-10-CM

## 2014-05-21 DIAGNOSIS — N179 Acute kidney failure, unspecified: Secondary | ICD-10-CM | POA: Diagnosis present

## 2014-05-21 DIAGNOSIS — R42 Dizziness and giddiness: Secondary | ICD-10-CM | POA: Diagnosis not present

## 2014-05-21 DIAGNOSIS — M79641 Pain in right hand: Secondary | ICD-10-CM | POA: Diagnosis not present

## 2014-05-21 DIAGNOSIS — R531 Weakness: Secondary | ICD-10-CM | POA: Diagnosis not present

## 2014-05-21 DIAGNOSIS — R5383 Other fatigue: Secondary | ICD-10-CM | POA: Diagnosis not present

## 2014-05-21 DIAGNOSIS — R2681 Unsteadiness on feet: Secondary | ICD-10-CM | POA: Diagnosis present

## 2014-05-21 DIAGNOSIS — H9190 Unspecified hearing loss, unspecified ear: Secondary | ICD-10-CM

## 2014-05-21 DIAGNOSIS — J189 Pneumonia, unspecified organism: Secondary | ICD-10-CM

## 2014-05-21 DIAGNOSIS — S3993XA Unspecified injury of pelvis, initial encounter: Secondary | ICD-10-CM | POA: Insufficient documentation

## 2014-05-21 DIAGNOSIS — W1830XA Fall on same level, unspecified, initial encounter: Secondary | ICD-10-CM | POA: Diagnosis present

## 2014-05-21 DIAGNOSIS — I739 Peripheral vascular disease, unspecified: Secondary | ICD-10-CM | POA: Diagnosis present

## 2014-05-21 DIAGNOSIS — R55 Syncope and collapse: Secondary | ICD-10-CM | POA: Diagnosis not present

## 2014-05-21 DIAGNOSIS — I129 Hypertensive chronic kidney disease with stage 1 through stage 4 chronic kidney disease, or unspecified chronic kidney disease: Secondary | ICD-10-CM | POA: Diagnosis present

## 2014-05-21 DIAGNOSIS — E785 Hyperlipidemia, unspecified: Secondary | ICD-10-CM | POA: Insufficient documentation

## 2014-05-21 DIAGNOSIS — Z974 Presence of external hearing-aid: Secondary | ICD-10-CM | POA: Insufficient documentation

## 2014-05-21 DIAGNOSIS — S0181XA Laceration without foreign body of other part of head, initial encounter: Secondary | ICD-10-CM | POA: Diagnosis present

## 2014-05-21 DIAGNOSIS — Z86718 Personal history of other venous thrombosis and embolism: Secondary | ICD-10-CM

## 2014-05-21 DIAGNOSIS — Z8619 Personal history of other infectious and parasitic diseases: Secondary | ICD-10-CM | POA: Insufficient documentation

## 2014-05-21 DIAGNOSIS — S0990XA Unspecified injury of head, initial encounter: Secondary | ICD-10-CM | POA: Diagnosis not present

## 2014-05-21 DIAGNOSIS — R404 Transient alteration of awareness: Secondary | ICD-10-CM | POA: Diagnosis not present

## 2014-05-21 DIAGNOSIS — Z7952 Long term (current) use of systemic steroids: Secondary | ICD-10-CM

## 2014-05-21 DIAGNOSIS — R350 Frequency of micturition: Secondary | ICD-10-CM | POA: Diagnosis present

## 2014-05-21 DIAGNOSIS — I480 Paroxysmal atrial fibrillation: Secondary | ICD-10-CM | POA: Diagnosis present

## 2014-05-21 DIAGNOSIS — T148 Other injury of unspecified body region: Secondary | ICD-10-CM

## 2014-05-21 DIAGNOSIS — N183 Chronic kidney disease, stage 3 (moderate): Secondary | ICD-10-CM | POA: Diagnosis present

## 2014-05-21 DIAGNOSIS — Z955 Presence of coronary angioplasty implant and graft: Secondary | ICD-10-CM

## 2014-05-21 DIAGNOSIS — R319 Hematuria, unspecified: Secondary | ICD-10-CM | POA: Diagnosis present

## 2014-05-21 DIAGNOSIS — S299XXA Unspecified injury of thorax, initial encounter: Secondary | ICD-10-CM | POA: Diagnosis not present

## 2014-05-21 DIAGNOSIS — K589 Irritable bowel syndrome without diarrhea: Secondary | ICD-10-CM | POA: Diagnosis present

## 2014-05-21 DIAGNOSIS — T148XXA Other injury of unspecified body region, initial encounter: Secondary | ICD-10-CM

## 2014-05-21 MED ORDER — IBUPROFEN 400 MG PO TABS
400.0000 mg | ORAL_TABLET | Freq: Once | ORAL | Status: AC
  Administered 2014-05-21: 400 mg via ORAL
  Filled 2014-05-21: qty 1

## 2014-05-21 MED ORDER — LEVOFLOXACIN 500 MG PO TABS: 500.0000 mg | ORAL_TABLET | Freq: Every day | ORAL | Status: DC

## 2014-05-21 MED ORDER — LEVOFLOXACIN 500 MG PO TABS
500.0000 mg | ORAL_TABLET | Freq: Every day | ORAL | Status: DC
  Administered 2014-05-21: 500 mg via ORAL
  Filled 2014-05-21 (×2): qty 1

## 2014-05-21 NOTE — Discharge Instructions (Signed)
We saw you in the ER after you had a fall. All the imaging results are normal, no fractures seen. No evidence of brain bleed. You have pain in the left wrist - it is advised to keep the wrist splint on and see the Orthopedic doctor, Dr. Merlyn LotKuzma in 1-2 weeks.  Also, you have cough and may be some signs of infection on Pneumonia. We will start you on antibiotics.  Please return to the ER if your symptoms worsen; you have increased pain, fevers, chills, inability to keep any medications down, confusion. Otherwise see the outpatient doctor as requested.  Please be very careful with walking, and do everything possible to prevent falls.   Pneumonia Pneumonia is an infection of the lungs.  CAUSES Pneumonia may be caused by bacteria or a virus. Usually, these infections are caused by breathing infectious particles into the lungs (respiratory tract). SIGNS AND SYMPTOMS   Cough.  Fever.  Chest pain.  Increased rate of breathing.  Wheezing.  Mucus production. DIAGNOSIS  If you have the common symptoms of pneumonia, your health care provider will typically confirm the diagnosis with a chest X-ray. The X-ray will show an abnormality in the lung (pulmonary infiltrate) if you have pneumonia. Other tests of your blood, urine, or sputum may be done to find the specific cause of your pneumonia. Your health care provider may also do tests (blood gases or pulse oximetry) to see how well your lungs are working. TREATMENT  Some forms of pneumonia may be spread to other people when you cough or sneeze. You may be asked to wear a mask before and during your exam. Pneumonia that is caused by bacteria is treated with antibiotic medicine. Pneumonia that is caused by the influenza virus may be treated with an antiviral medicine. Most other viral infections must run their course. These infections will not respond to antibiotics.  HOME CARE INSTRUCTIONS   Cough suppressants may be used if you are losing too much  rest. However, coughing protects you by clearing your lungs. You should avoid using cough suppressants if you can.  Your health care provider may have prescribed medicine if he or she thinks your pneumonia is caused by bacteria or influenza. Finish your medicine even if you start to feel better.  Your health care provider may also prescribe an expectorant. This loosens the mucus to be coughed up.  Take medicines only as directed by your health care provider.  Do not smoke. Smoking is a common cause of bronchitis and can contribute to pneumonia. If you are a smoker and continue to smoke, your cough may last several weeks after your pneumonia has cleared.  A cold steam vaporizer or humidifier in your room or home may help loosen mucus.  Coughing is often worse at night. Sleeping in a semi-upright position in a recliner or using a couple pillows under your head will help with this.  Get rest as you feel it is needed. Your body will usually let you know when you need to rest. PREVENTION A pneumococcal shot (vaccine) is available to prevent a common bacterial cause of pneumonia. This is usually suggested for:  People over 79 years old.  Patients on chemotherapy.  People with chronic lung problems, such as bronchitis or emphysema.  People with immune system problems. If you are over 65 or have a high risk condition, you may receive the pneumococcal vaccine if you have not received it before. In some countries, a routine influenza vaccine is also recommended. This  vaccine can help prevent some cases of pneumonia.You may be offered the influenza vaccine as part of your care. If you smoke, it is time to quit. You may receive instructions on how to stop smoking. Your health care provider can provide medicines and counseling to help you quit. SEEK MEDICAL CARE IF: You have a fever. SEEK IMMEDIATE MEDICAL CARE IF:   Your illness becomes worse. This is especially true if you are elderly or  weakened from any other disease.  You cannot control your cough with suppressants and are losing sleep.  You begin coughing up blood.  You develop pain which is getting worse or is uncontrolled with medicines.  Any of the symptoms which initially brought you in for treatment are getting worse rather than better.  You develop shortness of breath or chest pain. MAKE SURE YOU:   Understand these instructions.  Will watch your condition.  Will get help right away if you are not doing well or get worse. Document Released: 02/27/2005 Document Revised: 07/14/2013 Document Reviewed: 05/19/2010 [facility] Patient Information 2015 [place], [place]. This information is not intended to replace advice given to you by your health care provider. Make sure you discuss any questions you have with your health care provider. Contusion A contusion is a deep bruise. Contusions are the result of an injury that caused bleeding under the skin. The contusion may turn blue, purple, or yellow. Minor injuries will give you a painless contusion, but more severe contusions may stay painful and swollen for a few weeks.  CAUSES  A contusion is usually caused by a blow, trauma, or direct force to an area of the body. SYMPTOMS   Swelling and redness of the injured area.  Bruising of the injured area.  Tenderness and soreness of the injured area.  Pain. DIAGNOSIS  The diagnosis can be made by taking a history and physical exam. An X-ray, CT scan, or MRI may be needed to determine if there were any associated injuries, such as fractures. TREATMENT  Specific treatment will depend on what area of the body was injured. In general, the best treatment for a contusion is resting, icing, elevating, and applying cold compresses to the injured area. Over-the-counter medicines may also be recommended for pain control. Ask your caregiver what the best treatment is for your contusion. HOME CARE INSTRUCTIONS   Put ice on the  injured area.  Put ice in a plastic bag.  Place a towel between your skin and the bag.  Leave the ice on for 15-20 minutes, 3-4 times a day, or as directed by your health care provider.  Only take over-the-counter or prescription medicines for pain, discomfort, or fever as directed by your caregiver. Your caregiver may recommend avoiding anti-inflammatory medicines (aspirin, ibuprofen, and naproxen) for 48 hours because these medicines may increase bruising.  Rest the injured area.  If possible, elevate the injured area to reduce swelling. SEEK IMMEDIATE MEDICAL CARE IF:   You have increased bruising or swelling.  You have pain that is getting worse.  Your swelling or pain is not relieved with medicines. MAKE SURE YOU:   Understand these instructions.  Will watch your condition.  Will get help right away if you are not doing well or get worse. Document Released: 12/07/2004 Document Revised: 03/04/2013 Document Reviewed: 01/02/2011 [facility] Patient Information 2015 [place], [place]. This information is not intended to replace advice given to you by your health care provider. Make sure you discuss any questions you have with your health care  provider. Wrist Pain Wrist injuries are frequent in adults and children. A sprain is an injury to the ligaments that hold your bones together. A strain is an injury to muscle or muscle cord-like structures (tendons) from stretching or pulling. Generally, when wrists are moderately tender to touch following a fall or injury, a break in the bone (fracture) may be present. Most wrist sprains or strains are better in 3 to 5 days, but complete healing may take several weeks. HOME CARE INSTRUCTIONS   Put ice on the injured area.  Put ice in a plastic bag.  Place a towel between your skin and the bag.  Leave the ice on for 15-20 minutes, 3-4 times a day, for the first 2 days, or as directed by your health care provider.  Keep your arm raised above  the level of your heart whenever possible to reduce swelling and pain.  Rest the injured area for at least 48 hours or as directed by your health care provider.  If a splint or elastic bandage has been applied, use it for as long as directed by your health care provider or until seen by a health care provider for a follow-up exam.  Only take over-the-counter or prescription medicines for pain, discomfort, or fever as directed by your health care provider.  Keep all follow-up appointments. You may need to follow up with a specialist or have follow-up X-rays. Improvement in pain level is not a guarantee that you did not fracture a bone in your wrist. The only way to determine whether or not you have a broken bone is by X-ray. SEEK IMMEDIATE MEDICAL CARE IF:   Your fingers are swollen, very red, white, or cold and blue.  Your fingers are numb or tingling.  You have increasing pain.  You have difficulty moving your fingers. MAKE SURE YOU:   Understand these instructions.  Will watch your condition.  Will get help right away if you are not doing well or get worse. Document Released: 12/07/2004 Document Revised: 03/04/2013 Document Reviewed: 04/20/2010 [facility] Patient Information 2015 [place], [place]. This information is not intended to replace advice given to you by your health care provider. Make sure you discuss any questions you have with your health care provider.

## 2014-05-21 NOTE — ED Notes (Signed)
Daughter reports pt hasn't been eating or drinking well since Monday.

## 2014-05-21 NOTE — Telephone Encounter (Signed)
Patient had to go to er last night, for bad cough, now daughter Erskine Squibbjane is calling to see if dr pickard could prescribe tessalon pearls  [phone]

## 2014-05-21 NOTE — ED Notes (Addendum)
[facility] due to unwitnessed fall. Daughter requested pt to be transported due to increased weakness and "breathing is faster." Pt daughter states he was seen by PCP Dr. Tanya NonesPickard on Tuesday for a cough, was told he had a virus so they are treating symptoms and "it would pass on its own." Pt fall was unwitnessed and is on Xarelto. Pt at baseline, A/O x4. VSS at this time. NAD. Wounds present to right wrist/forearm that are bandaged. Pt walks with walker at assisted living facility.

## 2014-05-21 NOTE — ED Provider Notes (Signed)
CSN: [id number]     Arrival date & time 05/21/14  4098 History   First MD Initiated Contact with Patient 05/21/14 0756     Chief Complaint  Patient presents with  . Fall     (Consider location/radiation/quality/duration/timing/severity/associated sxs/prior Treatment) HPI Comments: Pt comes in with cc of fall. Pt has hx of PE on comuadin, CAD, PVD, TIA.  Pt is living at an assisted living. Pt's daughter at bedside, and reports that patient has hx of dizziness, and had had falls in the past. She was informed of the fall this morning. PT c/o pelvis pain, R Wrist pain. No headaches, chest pain, abd pain. Pt denies any nausea, headaches, neck pain, confusion, seizures. Daughter also reports increased cough, with congestion. Saw PCP recently.    Patient is a 81 y.o. male presenting with fall. The history is provided by the patient and a relative.  Fall Pertinent negatives include no chest pain, no abdominal pain and no shortness of breath.    Past Medical History  Diagnosis Date  . PVD (peripheral vascular disease)   . CAD (coronary artery disease)     a. 12/2005 - PCI to OM  . Hyperlipidemia   . Cerebrovascular accident   . DVT femoral (deep venous thrombosis) with thrombophlebitis   . GERD (gastroesophageal reflux disease)   . Edema   . Anxiety   . Vertigo   . IBS (irritable bowel syndrome)   . Duodenitis   . Gastritis   . Esophageal stricture   . Cellulitis   . Pulmonary embolism   . Prostatic hypertrophy   . Frequent falls   . Subdural hematoma   . PTSD (post-traumatic stress disorder)   . Vertigo   . TIA (transient ischemic attack)     a. 01/2006 and 05/2006.   [name] Hiatal hernia     periferal neuropathy  . Hx of echocardiogram     Echo 9/13:  Mild LVH, EF 60-65%, Gr 1 diast dysfn, mild AI, mild BAE  . Gout   . Chronic low back pain   . Gait disorder   . Peripheral neuropathy   . Hydrocele   . Migraine   . History of shingles     Left lumbar  . HOH (hard of  hearing)     hearing aid   Past Surgical History  Procedure Laterality Date  . Hand surgery    . Angioplasty    . Carotid endarterectomy      Left  . Bare-metal stenting      left circumflex  . Bilateral cataract surgery     Family History  Problem Relation Age of Onset  . Colon cancer Neg Hx   . Coronary artery disease Brother   . Heart attack Father   . Migraines Mother   . Breast cancer Sister   . Dementia Sister   . Renal Disease Brother    History  Substance Use Topics  . Smoking status: Never Smoker   . Smokeless tobacco: Never Used  . Alcohol Use: No     Comment: Wine occasional    Review of Systems  Constitutional: Positive for fatigue. Negative for activity change and appetite change.  Respiratory: Positive for cough. Negative for shortness of breath.   Cardiovascular: Negative for chest pain.  Gastrointestinal: Negative for abdominal pain.  Genitourinary: Negative for dysuria.  Musculoskeletal: Positive for myalgias.  Skin: Positive for wound.  Neurological: Positive for light-headedness.  Hematological: Bruises/bleeds easily.  All other systems reviewed and are  negative.     Allergies  Augmentin; Fludrocortisone acetate; and Scopace  Home Medications   Prior to Admission medications   Medication Sig Start Date End Date Taking? Authorizing Provider  acetaminophen (TYLENOL) 500 MG tablet Take 2 tablets (1,000 mg total) by mouth 2 (two) times daily. 01/13/14  Yes Donita Brooks, MD  ALPRAZolam Prudy Feeler) 0.25 MG tablet TAKE 1 TABLET BY MOUTH TWICE DAILY AS NEEDED* NEEDS OFFICE VISIT 09/04/13  Yes York Spaniel, MD  atenolol (TENORMIN) 50 MG tablet Take 1 tablet (50 mg total) by mouth daily. 04/13/14  Yes Donita Brooks, MD  benzonatate (TESSALON PERLES) 100 MG capsule Take 2 capsules (200 mg total) by mouth 3 (three) times daily as needed for cough. 05/19/14  Yes Donita Brooks, MD  cholecalciferol (VITAMIN D) 1000 UNITS tablet Take 1,000 Units by  mouth daily.   Yes Historical Provider, MD  DEXILANT 60 MG capsule TAKE ONE CAPSULE BY MOUTH EVERY DAY 03/16/14  Yes Donita Brooks, MD  docusate sodium (COLACE) 100 MG capsule Take 100 mg by mouth at bedtime.   Yes Historical Provider, MD  DULoxetine (CYMBALTA) 30 MG capsule TAKE 1 CAPSULE BY MOUTH DAILY 02/19/14  Yes York Spaniel, MD  HYDROcodone-acetaminophen (NORCO) 5-325 MG per tablet Take 1-2 tablets by mouth every 6 (six) hours as needed for moderate pain. 01/13/14  Yes Donita Brooks, MD  latanoprost (XALATAN) 0.005 % ophthalmic solution Place 1 drop into both eyes at bedtime.   Yes Historical Provider, MD  meclizine (ANTIVERT) 12.5 MG tablet TAKE 1 TABLET BY MOUTH THREE TIMES DAILY AS NEEDED FOR DIZZINESS 09/29/13  Yes Donita Brooks, MD  nitroGLYCERIN (NITROSTAT) 0.4 MG SL tablet Place 0.4 mg under the tongue every 5 (five) minutes as needed. Chest pain   Yes Historical Provider, MD  ondansetron (ZOFRAN) 4 MG tablet Take 4 mg by mouth every 8 (eight) hours as needed for nausea.   Yes Historical Provider, MD  polyethylene glycol powder (GLYCOLAX/MIRALAX) powder MIX 17 GRAMS WITH 4 TO 6 OUNCES OF WATER AND DRINK ONCE DAILY AS NEEDED FOR CONSTIPATION 03/07/13  Yes Donita Brooks, MD  promethazine (PHENERGAN) 25 MG tablet Take 1 tablet (25 mg total) by mouth every 6 (six) hours as needed. nausea 12/12/12  Yes Donita Brooks, MD  Rivaroxaban (XARELTO) 15 MG TABS tablet TAKE 1 TABLET BY MOUTH EVERY DAY WITH SUPPER 03/18/14  Yes Tonny Bollman, MD  Tamsulosin HCl (FLOMAX) 0.4 MG CAPS Take 0.4 mg by mouth daily.    Yes Historical Provider, MD  tiZANidine (ZANAFLEX) 2 MG tablet Take 2 mg by mouth every 6 (six) hours as needed (for muscle relaxant).   Yes Historical Provider, MD  zolpidem (AMBIEN) 5 MG tablet Take 5 mg by mouth at bedtime as needed. For insomnia.   Yes Historical Provider, MD  levofloxacin (LEVAQUIN) 500 MG tablet Take 1 tablet (500 mg total) by mouth daily. 05/21/14   Derwood Kaplan, MD  Melatonin 10 MG CAPS Take 10 mg by mouth.    Historical Provider, MD  predniSONE (DELTASONE) 10 MG tablet Take 1 tablet (10 mg total) by mouth daily with breakfast. 01/13/14   Donita Brooks, MD   BP 142/61 mmHg  Pulse 45  Temp(Src) 98.6 F (37 C) (Oral)  Resp 14  SpO2 94% Physical Exam  Constitutional: He is oriented to person, place, and time. He appears well-developed.  HENT:  Head: Normocephalic and atraumatic.  Eyes: Conjunctivae and EOM are normal.  Pupils are equal, round, and reactive to light.  Neck: Normal range of motion. Neck supple.  Cardiovascular: Normal rate and regular rhythm.   Pulmonary/Chest: Effort normal. He has wheezes.  Right sided rhonchi  Abdominal: Soft. Bowel sounds are normal. He exhibits no distension. There is no tenderness. There is no rebound and no guarding.  Neurological: He is alert and oriented to person, place, and time.  Skin: Skin is warm.  Nursing note and vitals reviewed.   ED Course  Procedures (including critical care time) Labs Review Labs Reviewed - No data to display  Imaging Review Dg Chest 2 View  05/21/2014   CLINICAL DATA:  Fall, weakness  EXAM: CHEST  2 VIEW  COMPARISON:  12/28/2012  FINDINGS: Cardiac stress that borderline cardiomegaly. Mild interstitial prominence bilaterally. Mild edema or pneumonitis cannot be excluded. No segmental infiltrate. Mild left basilar atelectasis.  IMPRESSION: Mild interstitial prominence bilaterally. Mild edema or pneumonitis cannot be excluded. No segmental infiltrate. Mild left basilar atelectasis   Electronically Signed   By: Natasha Mead M.D.   On: 05/21/2014 09:33   Dg Pelvis 1-2 Views  05/21/2014   CLINICAL DATA:  Fall  EXAM: PELVIS - 1-2 VIEW  COMPARISON:  None.  FINDINGS: There is no evidence of pelvic fracture or diastasis. No pelvic bone lesions are seen. Pelvic phleboliths are noted. Mild degenerative changes bilateral hip joints.  IMPRESSION: No acute fracture or  subluxation. Mild degenerative changes bilateral hip joints.   Electronically Signed   By: Natasha Mead M.D.   On: 05/21/2014 09:34   Dg Wrist Complete Right  05/21/2014   CLINICAL DATA:  Fall right wrist laceration  EXAM: RIGHT WRIST - COMPLETE 3+ VIEW  COMPARISON:  None.  FINDINGS: Four views of the right wrist submitted. No acute fracture or subluxation. There is narrowing of radiocarpal joint space. Chondrocalcinosis. Mild degenerative changes first carpometacarpal joint.  IMPRESSION: No acute fracture or subluxation. Narrowing of radiocarpal joint space. Mild chondrocalcinosis radiocarpal joint. Degenerative changes first carpometacarpal joint.   Electronically Signed   By: Natasha Mead M.D.   On: 05/21/2014 09:35   Ct Head Wo Contrast  05/21/2014   CLINICAL DATA:  unwitnessed fall.  EXAM: CT HEAD WITHOUT CONTRAST  TECHNIQUE: Contiguous axial images were obtained from the base of the skull through the vertex without intravenous contrast.  COMPARISON:  12/16/2012  FINDINGS: There is diffuse low attenuation throughout the subcortical and periventricular white matter consistent with chronic microvascular disease. There is prominence of the sulci and ventricles consistent with brain atrophy. Chronic right basal ganglia lacunar infarcts noted. No acute cortical infarct, hemorrhage, or mass lesion ispresent.No significant extra-axial fluid collection is present. The paranasal sinuses andmastoid air cells are clear. The osseous skull is intact.  IMPRESSION: 1. No acute intracranial abnormalities. 2. Advanced small vessel ischemic disease and brain atrophy.   Electronically Signed   By: Signa Kell M.D.   On: 05/21/2014 09:07     EKG Interpretation None      @12 :00 Xray results reviewed with Pt and daughter. I helped him get up and urinate, and he did well on his own. Discussed with the daughter, that the CXR shows some prominence on the L side of the lungs, no clear Pneumonia, but with cough that is  getting worse and poor breath sounds, it might be better to treat him as CAP (pt is at an independent assisted living). I added blood work, but pt's daughter felt that the labs were not necessary, as  she has already gotten someone who can stay with her father for the next 2 days. We also ordered ambulatory pulsox, which was cancelled for the same reason. On vitals, pt has 0 SIRs. He in not hypoxic. I think oral levaquin will be beneficial in this setting.  STRICT return precautions discussed with the daughter - and they will see PCP and Orthopedics as requested.   MDM   Final diagnoses:  Fall  CAP (community acquired pneumonia)  Contusion    Pt comes in with cc of fall. Pt has dizziness chronically, and fell after he got woozy. No emesis, diarrhea, no new meds,. Pt on coumadin - CT head ordered, and neg. Pt is having wrist pain and pelvic pain - xrays neg, he has tenderness over the scaphoid region, RUE, so wrist splint was applied, and wound dressed. Ortho f/u info given. CXR ordered due to increased weakness and cough. Left sided has some opacity. No fevers, no hypoxia. + cough, non productive, with rhonchi on exam. Pt at an independent assisted living, so is not HCAP by definition. Will reassess for admission and consider labs.    Derwood KaplanAnkit Angelia Hazell, MD 05/21/14 1312

## 2014-05-21 NOTE — ED Notes (Signed)
Pt offered admission to hospital by ED physician and daughter refused stating she had people at home who can help take care of him for the next 2 days. Restated concerned for patient falling while at home due to pneumonia and daughter again stressed that assisted living situation would be safe for patient.

## 2014-05-22 ENCOUNTER — Emergency Department (HOSPITAL_COMMUNITY): Payer: Medicare Other

## 2014-05-22 ENCOUNTER — Encounter (HOSPITAL_COMMUNITY): Payer: Self-pay | Admitting: Emergency Medicine

## 2014-05-22 ENCOUNTER — Observation Stay (HOSPITAL_COMMUNITY): Payer: Medicare Other

## 2014-05-22 ENCOUNTER — Inpatient Hospital Stay (HOSPITAL_COMMUNITY)
Admission: EM | Admit: 2014-05-22 | Discharge: 2014-05-26 | DRG: 312 | Disposition: A | Discharge status: Skilled Nursing Facility | Payer: Medicare Other | Attending: Internal Medicine | Admitting: Internal Medicine

## 2014-05-22 DIAGNOSIS — I480 Paroxysmal atrial fibrillation: Secondary | ICD-10-CM | POA: Diagnosis present

## 2014-05-22 DIAGNOSIS — N183 Chronic kidney disease, stage 3 unspecified: Secondary | ICD-10-CM | POA: Diagnosis present

## 2014-05-22 DIAGNOSIS — J209 Acute bronchitis, unspecified: Secondary | ICD-10-CM | POA: Diagnosis present

## 2014-05-22 DIAGNOSIS — J90 Pleural effusion, not elsewhere classified: Secondary | ICD-10-CM | POA: Diagnosis not present

## 2014-05-22 DIAGNOSIS — D696 Thrombocytopenia, unspecified: Secondary | ICD-10-CM | POA: Diagnosis not present

## 2014-05-22 DIAGNOSIS — N200 Calculus of kidney: Secondary | ICD-10-CM | POA: Diagnosis not present

## 2014-05-22 DIAGNOSIS — R001 Bradycardia, unspecified: Secondary | ICD-10-CM

## 2014-05-22 DIAGNOSIS — Z8673 Personal history of transient ischemic attack (TIA), and cerebral infarction without residual deficits: Secondary | ICD-10-CM | POA: Diagnosis not present

## 2014-05-22 DIAGNOSIS — Z86711 Personal history of pulmonary embolism: Secondary | ICD-10-CM

## 2014-05-22 DIAGNOSIS — S0101XA Laceration without foreign body of scalp, initial encounter: Secondary | ICD-10-CM | POA: Diagnosis not present

## 2014-05-22 DIAGNOSIS — J208 Acute bronchitis due to other specified organisms: Secondary | ICD-10-CM | POA: Diagnosis not present

## 2014-05-22 DIAGNOSIS — G8929 Other chronic pain: Secondary | ICD-10-CM | POA: Diagnosis not present

## 2014-05-22 DIAGNOSIS — N3281 Overactive bladder: Secondary | ICD-10-CM | POA: Diagnosis not present

## 2014-05-22 DIAGNOSIS — R55 Syncope and collapse: Secondary | ICD-10-CM | POA: Diagnosis present

## 2014-05-22 DIAGNOSIS — S0181XA Laceration without foreign body of other part of head, initial encounter: Secondary | ICD-10-CM | POA: Diagnosis not present

## 2014-05-22 DIAGNOSIS — N19 Unspecified kidney failure: Secondary | ICD-10-CM

## 2014-05-22 DIAGNOSIS — I10 Essential (primary) hypertension: Secondary | ICD-10-CM | POA: Diagnosis present

## 2014-05-22 DIAGNOSIS — K219 Gastro-esophageal reflux disease without esophagitis: Secondary | ICD-10-CM | POA: Diagnosis not present

## 2014-05-22 DIAGNOSIS — S50812A Abrasion of left forearm, initial encounter: Secondary | ICD-10-CM | POA: Diagnosis not present

## 2014-05-22 DIAGNOSIS — G629 Polyneuropathy, unspecified: Secondary | ICD-10-CM | POA: Diagnosis not present

## 2014-05-22 DIAGNOSIS — R42 Dizziness and giddiness: Secondary | ICD-10-CM

## 2014-05-22 DIAGNOSIS — T148XXA Other injury of unspecified body region, initial encounter: Secondary | ICD-10-CM | POA: Insufficient documentation

## 2014-05-22 DIAGNOSIS — E860 Dehydration: Secondary | ICD-10-CM

## 2014-05-22 DIAGNOSIS — I248 Other forms of acute ischemic heart disease: Secondary | ICD-10-CM

## 2014-05-22 DIAGNOSIS — E785 Hyperlipidemia, unspecified: Secondary | ICD-10-CM | POA: Diagnosis not present

## 2014-05-22 DIAGNOSIS — N401 Enlarged prostate with lower urinary tract symptoms: Secondary | ICD-10-CM | POA: Diagnosis not present

## 2014-05-22 DIAGNOSIS — F419 Anxiety disorder, unspecified: Secondary | ICD-10-CM | POA: Diagnosis not present

## 2014-05-22 DIAGNOSIS — D6859 Other primary thrombophilia: Secondary | ICD-10-CM | POA: Diagnosis not present

## 2014-05-22 DIAGNOSIS — R51 Headache: Secondary | ICD-10-CM | POA: Diagnosis not present

## 2014-05-22 DIAGNOSIS — S0083XA Contusion of other part of head, initial encounter: Secondary | ICD-10-CM | POA: Diagnosis not present

## 2014-05-22 DIAGNOSIS — R319 Hematuria, unspecified: Secondary | ICD-10-CM | POA: Diagnosis not present

## 2014-05-22 DIAGNOSIS — I129 Hypertensive chronic kidney disease with stage 1 through stage 4 chronic kidney disease, or unspecified chronic kidney disease: Secondary | ICD-10-CM | POA: Diagnosis not present

## 2014-05-22 DIAGNOSIS — F4310 Post-traumatic stress disorder, unspecified: Secondary | ICD-10-CM | POA: Diagnosis not present

## 2014-05-22 DIAGNOSIS — R05 Cough: Secondary | ICD-10-CM | POA: Diagnosis not present

## 2014-05-22 DIAGNOSIS — N179 Acute kidney failure, unspecified: Secondary | ICD-10-CM | POA: Diagnosis not present

## 2014-05-22 DIAGNOSIS — H9190 Unspecified hearing loss, unspecified ear: Secondary | ICD-10-CM | POA: Diagnosis not present

## 2014-05-22 DIAGNOSIS — I2510 Atherosclerotic heart disease of native coronary artery without angina pectoris: Secondary | ICD-10-CM | POA: Diagnosis not present

## 2014-05-22 DIAGNOSIS — A059 Bacterial foodborne intoxication, unspecified: Secondary | ICD-10-CM | POA: Diagnosis not present

## 2014-05-22 DIAGNOSIS — I739 Peripheral vascular disease, unspecified: Secondary | ICD-10-CM | POA: Diagnosis not present

## 2014-05-22 DIAGNOSIS — N644 Mastodynia: Secondary | ICD-10-CM | POA: Diagnosis present

## 2014-05-22 DIAGNOSIS — S098XXA Other specified injuries of head, initial encounter: Secondary | ICD-10-CM | POA: Diagnosis not present

## 2014-05-22 DIAGNOSIS — R350 Frequency of micturition: Secondary | ICD-10-CM | POA: Diagnosis not present

## 2014-05-22 DIAGNOSIS — J069 Acute upper respiratory infection, unspecified: Secondary | ICD-10-CM | POA: Diagnosis not present

## 2014-05-22 DIAGNOSIS — I951 Orthostatic hypotension: Secondary | ICD-10-CM | POA: Diagnosis not present

## 2014-05-22 HISTORY — DX: Frequency of micturition: R35.0

## 2014-05-22 HISTORY — DX: Blindness, one eye, unspecified eye: H54.40

## 2014-05-22 HISTORY — DX: Depression, unspecified: F32.A

## 2014-05-22 HISTORY — DX: Headache, unspecified: R51.9

## 2014-05-22 HISTORY — DX: Headache: R51

## 2014-05-22 HISTORY — DX: Major depressive disorder, single episode, unspecified: F32.9

## 2014-05-22 HISTORY — DX: Acute myocardial infarction, unspecified: I21.9

## 2014-05-22 LAB — CBC WITH DIFFERENTIAL/PLATELET
Basophils Absolute: 0 10*3/uL (ref 0.0–0.1)
Basophils Relative: 0 % (ref 0–1)
EOS ABS: 0.2 10*3/uL (ref 0.0–0.7)
Eosinophils Relative: 4 % (ref 0–5)
HCT: 38.5 % — ABNORMAL LOW (ref 39.0–52.0)
HEMOGLOBIN: 12.8 g/dL — AB (ref 13.0–17.0)
Lymphocytes Relative: 24 % (ref 12–46)
Lymphs Abs: 1.2 10*3/uL (ref 0.7–4.0)
MCH: 32.3 pg (ref 26.0–34.0)
MCHC: 33.2 g/dL (ref 30.0–36.0)
MCV: 97.2 fL (ref 78.0–100.0)
Monocytes Absolute: 0.5 10*3/uL (ref 0.1–1.0)
Monocytes Relative: 11 % (ref 3–12)
NEUTROS ABS: 3 10*3/uL (ref 1.7–7.7)
NEUTROS PCT: 61 % (ref 43–77)
PLATELETS: 110 10*3/uL — AB (ref 150–400)
RBC: 3.96 MIL/uL — ABNORMAL LOW (ref 4.22–5.81)
RDW: 16.2 % — ABNORMAL HIGH (ref 11.5–15.5)
WBC: 4.9 10*3/uL (ref 4.0–10.5)

## 2014-05-22 LAB — I-STAT TROPONIN, ED: Troponin i, poc: 1.65 ng/mL (ref 0.00–0.08)

## 2014-05-22 LAB — MAGNESIUM: Magnesium: 1.9 mg/dL (ref 1.5–2.5)

## 2014-05-22 LAB — BASIC METABOLIC PANEL
Anion gap: 9 (ref 5–15)
BUN: 26 mg/dL — AB (ref 6–23)
CALCIUM: 8.3 mg/dL — AB (ref 8.4–10.5)
CO2: 21 mmol/L (ref 19–32)
Chloride: 109 mmol/L (ref 96–112)
Creatinine, Ser: 1.6 mg/dL — ABNORMAL HIGH (ref 0.50–1.35)
GFR calc Af Amer: 41 mL/min — ABNORMAL LOW (ref >90)
GFR, EST NON AFRICAN AMERICAN: 36 mL/min — AB (ref >90)
Glucose, Bld: 125 mg/dL — ABNORMAL HIGH (ref 70–99)
Potassium: 4 mmol/L (ref 3.5–5.1)
SODIUM: 139 mmol/L (ref 135–145)

## 2014-05-22 LAB — URINE MICROSCOPIC-ADD ON

## 2014-05-22 LAB — URINALYSIS, ROUTINE W REFLEX MICROSCOPIC
Bilirubin Urine: NEGATIVE
GLUCOSE, UA: NEGATIVE mg/dL
KETONES UR: NEGATIVE mg/dL
LEUKOCYTES UA: NEGATIVE
NITRITE: NEGATIVE
PH: 6 (ref 5.0–8.0)
PROTEIN: 30 mg/dL — AB
Specific Gravity, Urine: 1.022 (ref 1.005–1.030)
Urobilinogen, UA: 1 mg/dL (ref 0.0–1.0)

## 2014-05-22 LAB — TROPONIN I: TROPONIN I: 2.01 ng/mL — AB (ref <0.031)

## 2014-05-22 LAB — HIV RAPID SCREEN (BLD OR BODY FLD EXPOSURE): Rapid HIV Screen: NONREACTIVE

## 2014-05-22 MED ORDER — HYDROCODONE-ACETAMINOPHEN 5-325 MG PO TABS
1.0000 | ORAL_TABLET | Freq: Four times a day (QID) | ORAL | Status: DC | PRN
  Administered 2014-05-22 – 2014-05-25 (×3): 1 via ORAL
  Filled 2014-05-22 (×3): qty 1

## 2014-05-22 MED ORDER — ACETAMINOPHEN 500 MG PO TABS
1000.0000 mg | ORAL_TABLET | Freq: Two times a day (BID) | ORAL | Status: DC
  Administered 2014-05-23 – 2014-05-26 (×8): 1000 mg via ORAL
  Filled 2014-05-22 (×8): qty 2

## 2014-05-22 MED ORDER — SODIUM CHLORIDE 0.9 % IJ SOLN
3.0000 mL | Freq: Two times a day (BID) | INTRAMUSCULAR | Status: DC
  Administered 2014-05-23 – 2014-05-26 (×5): 3 mL via INTRAVENOUS

## 2014-05-22 MED ORDER — RIVAROXABAN 15 MG PO TABS
15.0000 mg | ORAL_TABLET | Freq: Every day | ORAL | Status: DC
  Administered 2014-05-23 – 2014-05-25 (×3): 15 mg via ORAL
  Filled 2014-05-22 (×3): qty 1

## 2014-05-22 MED ORDER — TAMSULOSIN HCL 0.4 MG PO CAPS
0.4000 mg | ORAL_CAPSULE | Freq: Every day | ORAL | Status: DC
  Administered 2014-05-23 – 2014-05-24 (×2): 0.4 mg via ORAL
  Filled 2014-05-22 (×2): qty 1

## 2014-05-22 MED ORDER — ALBUTEROL SULFATE (2.5 MG/3ML) 0.083% IN NEBU: 2.5000 mg | INHALATION_SOLUTION | RESPIRATORY_TRACT | Status: DC | PRN

## 2014-05-22 MED ORDER — LIDOCAINE-EPINEPHRINE (PF) 2 %-1:200000 IJ SOLN
20.0000 mL | Freq: Once | INTRAMUSCULAR | Status: AC
  Administered 2014-05-22: 20 mL
  Filled 2014-05-22: qty 20

## 2014-05-22 MED ORDER — LORAZEPAM 0.5 MG PO TABS: 0.5000 mg | ORAL_TABLET | Freq: Once | ORAL | Status: DC

## 2014-05-22 MED ORDER — MECLIZINE HCL 25 MG PO TABS: 12.5000 mg | ORAL_TABLET | Freq: Three times a day (TID) | ORAL | Status: DC | PRN

## 2014-05-22 MED ORDER — BENZONATATE 100 MG PO CAPS
200.0000 mg | ORAL_CAPSULE | Freq: Three times a day (TID) | ORAL | Status: DC | PRN
  Administered 2014-05-23 – 2014-05-25 (×2): 200 mg via ORAL
  Filled 2014-05-22 (×2): qty 2

## 2014-05-22 MED ORDER — PANTOPRAZOLE SODIUM 40 MG PO TBEC
40.0000 mg | DELAYED_RELEASE_TABLET | Freq: Every day | ORAL | Status: DC
Start: 2014-05-22 — End: 2014-05-26
  Administered 2014-05-23 – 2014-05-26 (×4): 40 mg via ORAL
  Filled 2014-05-22 (×4): qty 1

## 2014-05-22 MED ORDER — DOCUSATE SODIUM 100 MG PO CAPS
100.0000 mg | ORAL_CAPSULE | Freq: Every day | ORAL | Status: DC
  Administered 2014-05-22 – 2014-05-25 (×4): 100 mg via ORAL
  Filled 2014-05-22 (×4): qty 1

## 2014-05-22 MED ORDER — TECHNETIUM TO 99M ALBUMIN AGGREGATED
6.0000 | Freq: Once | INTRAVENOUS | Status: AC | PRN
  Administered 2014-05-22: 6 via INTRAVENOUS

## 2014-05-22 MED ORDER — LIDOCAINE-EPINEPHRINE 2 %-1:100000 IJ SOLN
20.0000 mL | Freq: Once | INTRAMUSCULAR | Status: DC
  Filled 2014-05-22: qty 20

## 2014-05-22 MED ORDER — SODIUM CHLORIDE 0.9 % IV BOLUS (SEPSIS)
500.0000 mL | Freq: Once | INTRAVENOUS | Status: AC
  Administered 2014-05-22: 500 mL via INTRAVENOUS

## 2014-05-22 MED ORDER — NITROGLYCERIN 0.4 MG SL SUBL
0.4000 mg | SUBLINGUAL_TABLET | SUBLINGUAL | Status: DC | PRN
Start: 2014-05-22 — End: 2014-05-26
  Filled 2014-05-22: qty 1

## 2014-05-22 MED ORDER — ONDANSETRON HCL 4 MG/2ML IJ SOLN: 4.0000 mg | Freq: Four times a day (QID) | INTRAMUSCULAR | Status: DC | PRN

## 2014-05-22 MED ORDER — ATENOLOL 25 MG PO TABS
25.0000 mg | ORAL_TABLET | Freq: Every day | ORAL | Status: DC
  Administered 2014-05-23: 25 mg via ORAL
  Filled 2014-05-22 (×3): qty 1

## 2014-05-22 MED ORDER — LATANOPROST 0.005 % OP SOLN
1.0000 [drp] | Freq: Every day | OPHTHALMIC | Status: DC
  Administered 2014-05-22 – 2014-05-25 (×4): 1 [drp] via OPHTHALMIC
  Filled 2014-05-22 (×2): qty 2.5

## 2014-05-22 MED ORDER — GUAIFENESIN ER 600 MG PO TB12
1200.0000 mg | ORAL_TABLET | Freq: Two times a day (BID) | ORAL | Status: DC
  Administered 2014-05-22 – 2014-05-26 (×8): 1200 mg via ORAL
  Filled 2014-05-22 (×8): qty 2

## 2014-05-22 MED ORDER — ALPRAZOLAM 0.25 MG PO TABS
0.2500 mg | ORAL_TABLET | Freq: Two times a day (BID) | ORAL | Status: DC | PRN
  Administered 2014-05-22 – 2014-05-25 (×3): 0.25 mg via ORAL
  Filled 2014-05-22 (×3): qty 1

## 2014-05-22 MED ORDER — SODIUM CHLORIDE 0.9 % IV SOLN
INTRAVENOUS | Status: AC
  Administered 2014-05-22: 18:00:00 via INTRAVENOUS
  Administered 2014-05-23: 75 mL/h via INTRAVENOUS

## 2014-05-22 MED ORDER — DULOXETINE HCL 30 MG PO CPEP
30.0000 mg | ORAL_CAPSULE | Freq: Every day | ORAL | Status: DC
  Administered 2014-05-23 – 2014-05-26 (×4): 30 mg via ORAL
  Filled 2014-05-22 (×4): qty 1

## 2014-05-22 MED ORDER — MIRTAZAPINE 7.5 MG PO TABS
7.5000 mg | ORAL_TABLET | Freq: Once | ORAL | Status: AC
  Administered 2014-05-23: 7.5 mg via ORAL
  Filled 2014-05-22: qty 1

## 2014-05-22 MED ORDER — TECHNETIUM TC 99M DIETHYLENETRIAME-PENTAACETIC ACID: 40.0000 | Freq: Once | INTRAVENOUS | Status: DC | PRN

## 2014-05-22 MED ORDER — ONDANSETRON HCL 4 MG PO TABS: 4.0000 mg | ORAL_TABLET | Freq: Four times a day (QID) | ORAL | Status: DC | PRN

## 2014-05-22 NOTE — ED Notes (Signed)
CT called and made aware that pt needs to have CT quickly due to elevated troponin and needing to know if we can give heparin and aspirin, per MD Horton

## 2014-05-22 NOTE — ED Notes (Signed)
c-collar applied  

## 2014-05-22 NOTE — ED Notes (Addendum)
Pt here yesterday, pt experienced another fall, with LOC, unwitnessed, per pt he "passed out" pt states he woke up feeling weak, ate breakfast, and passed out. 2-3 in Lac present to right forehead. Bleeding controlled at this time. Pt on Xarelto. Hr 45. Pt was diagnosed with pneumonia yesterday, sats 83% on 2L. Pt pale and clammy. EMS gave 250 cc NS, pressure was 94/57, up to 119/53. Skin tear to left forearm. Pain to left scapula, passed spinal/neck test per EMS.

## 2014-05-22 NOTE — ED Provider Notes (Signed)
LACERATION REPAIR Performed by: Trixie DredgeWEST, Jerilyn Gillaspie Authorized by: Trixie DredgeWEST, Kooper Godshall Consent: Verbal consent obtained. Risks and benefits: risks, benefits and alternatives were discussed Consent given by: patient Patient identity confirmed: provided demographic data Prepped and Draped in normal sterile fashion Wound explored  Laceration Location: forehead  Laceration Length: 6 cm, stellate  No Foreign Bodies seen or palpated  Anesthesia: local infiltration  Local anesthetic: lidocaine 2% with epinephrine  Anesthetic total: 5 ml  Irrigation method: syringe Amount of cleaning: standard  Skin closure: 5-0 ethilon  Number of sutures: 15  Technique: simple interrupted  Patient tolerance: Patient tolerated the procedure well with no immediate complications.   Trixie Dredgemily Odaly Peri, PA-C 05/22/14 1353  Shon Batonourtney F Horton, MD 05/23/14 1029

## 2014-05-22 NOTE — ED Notes (Signed)
Lab results reported to Dr.Horton.

## 2014-05-22 NOTE — Progress Notes (Signed)
ANTICOAGULATION CONSULT NOTE - Initial Consult  Pharmacy Consult for xarelto Indication: hx of recurrent DVT/PE  Allergies  Allergen Reactions  . Augmentin [Amoxicillin-Pot Clavulanate] Nausea And Vomiting    daughter states he can tolerate Amoxicillin ok  . Fludrocortisone Acetate Other (See Comments)    unknown  . Scopace [Scopolamine] Other (See Comments)    *patch Unknown reaction    Patient Measurements:   Heparin Dosing Weight:   Vital Signs: Temp: 97.8 F (36.6 C) (03/11 1700) Temp Source: Oral (03/11 1700) BP: 168/71 mmHg (03/11 1700) Pulse Rate: 53 (03/11 1700)  Labs:  Recent Labs  05/22/14 1135  HGB 12.8*  HCT 38.5*  PLT 110*  CREATININE 1.60*    Estimated Creatinine Clearance: 30.4 mL/min (by C-G formula based on Cr of 1.6).   Medical History: Past Medical History  Diagnosis Date  . PVD (peripheral vascular disease)   . CAD (coronary artery disease)     a. 12/2005 - PCI to OM  . Hyperlipidemia   . Cerebrovascular accident   . DVT femoral (deep venous thrombosis) with thrombophlebitis   . GERD (gastroesophageal reflux disease)   . Edema   . Anxiety   . Vertigo   . IBS (irritable bowel syndrome)   . Duodenitis   . Gastritis   . Esophageal stricture   . Cellulitis   . Pulmonary embolism   . Prostatic hypertrophy   . Frequent falls   . Subdural hematoma   . PTSD (post-traumatic stress disorder)   . Vertigo   . TIA (transient ischemic attack)     a. 01/2006 and 05/2006.   [name]. Hiatal hernia     periferal neuropathy  . Hx of echocardiogram     Echo 9/13:  Mild LVH, EF 60-65%, Gr 1 diast dysfn, mild AI, mild BAE  . Gout   . Chronic low back pain   . Gait disorder   . Peripheral neuropathy   . Hydrocele   . Migraine   . History of shingles     Left lumbar  . HOH (hard of hearing)     hearing aid    Medications:  Scheduled:  . acetaminophen  1,000 mg Oral BID  . [START ON 05/23/2014] atenolol  25 mg Oral Daily  . docusate sodium  100  mg Oral QHS  . [START ON 05/23/2014] DULoxetine  30 mg Oral Daily  . guaiFENesin  1,200 mg Oral BID  . latanoprost  1 drop Both Eyes QHS  . pantoprazole  40 mg Oral Daily  . sodium chloride  3 mL Intravenous Q12H  . tamsulosin  0.4 mg Oral Daily   Infusions:  . sodium chloride      Assessment: 81 yo male with hx of recurrent PE/DVT will be resumed on home xarelto tomorrow (05/23/14) due to scalp laceration and suturing per MD's request.  CrCl ~30.4.  Hgb 12.8 and Plt 100 K  Goal of Therapy:   Monitor platelets by anticoagulation protocol: Yes   Plan:  - Xarelto 15 mg po daily with supper starting on 05/23/14 (dose reduction recommended by the Beer's guideline in patient who is >/= [age] yo with a CrCl between 30-50 mL/min) - monitor for bleeding - monitor renal function  Paulina Muchmore, Tsz-Yin 05/22/2014,5:08 PM

## 2014-05-22 NOTE — ED Notes (Signed)
Pt gone to CT at this time.

## 2014-05-22 NOTE — Telephone Encounter (Signed)
Pt was assessed in ER for cough

## 2014-05-22 NOTE — Consult Note (Signed)
CARDIOLOGY CONSULT NOTE   Patient ID: Jason [name] MRN: [id number], DOB/AGE: 79-Dec-1921   Admit date: 05/22/2014 Date of Consult: 05/22/2014   Primary Physician: Leo GrosserPICKARD,[name], MD Primary Cardiologist: Dr. Excell Seltzerooper  Pt. Profile  pleasant 81 year old Caucasian male with PMH of TIA, PVD, PAF, hypercoagulable state with recurrent DVT/PE on Xarelto, and history of CAD status post PCI to OM in October 2007 present with syncope and recurrent fall. Noted to have elevated trop in ED, cardiology consulted  Problem List  Past Medical History  Diagnosis Date  . PVD (peripheral vascular disease)   . CAD (coronary artery disease)     a. 12/2005 - PCI to OM  . Hyperlipidemia   . Cerebrovascular accident   . DVT femoral (deep venous thrombosis) with thrombophlebitis   . GERD (gastroesophageal reflux disease)   . Edema   . Anxiety   . Vertigo   . IBS (irritable bowel syndrome)   . Duodenitis   . Gastritis   . Esophageal stricture   . Cellulitis   . Pulmonary embolism   . Prostatic hypertrophy   . Frequent falls   . Subdural hematoma   . PTSD (post-traumatic stress disorder)   . Vertigo   . TIA (transient ischemic attack)     a. 01/2006 and 05/2006.   [name]. Hiatal hernia     periferal neuropathy  . Hx of echocardiogram     Echo 9/13:  Mild LVH, EF 60-65%, Gr 1 diast dysfn, mild AI, mild BAE  . Gout   . Chronic low back pain   . Gait disorder   . Peripheral neuropathy   . Hydrocele   . Migraine   . History of shingles     Left lumbar  . HOH (hard of hearing)     hearing aid    Past Surgical History  Procedure Laterality Date  . Hand surgery    . Angioplasty    . Carotid endarterectomy      Left  . Bare-metal stenting      left circumflex  . Bilateral cataract surgery       Allergies  Allergies  Allergen Reactions  . Augmentin [Amoxicillin-Pot Clavulanate] Nausea And Vomiting    daughter states he can tolerate Amoxicillin ok  . Fludrocortisone Acetate Other (See  Comments)    unknown  . Scopace [Scopolamine] Other (See Comments)    *patch Unknown reaction    HPI   The patient is a pleasant 81 year old Caucasian male with PMH of TIA, PVD, PAF, hypercoagulable state with recurrent DVT/PE on Xarelto, and history of CAD status post PCI to OM in October 2007. He has undergone IVC filter placement after recurrent DVT. Patient has been on Xarelto without significant bleeding problem. According to the daughter, he does have significant vertigo problem and has recurrent dizziness. She is unaware of any history of orthostatic hypotension. Per patient, he has been doing okay at her assisted living facility. He does occasionally do water aerobic, he has not had any recurrent chest pain, however he does have occasional arm pain with water aerobic. He could not remember what was his previous anginal symptom packing 2007 as he had DVT around the same time. When asked about his previous anginal symptoms, he states he had leg pain.   For the past week, patient had significantly decreased appetite, increasing productive cough and weakness. According to the daughter, he has not been eating very well recently. He was scheduled to see Dr. Excell Seltzerooper last month, however did  not show up in the clinic as he was not feeling good. On Tuesday 3/8, the daughter took the patient to his PCPs office who prescribed him some Tessalon for URI. Patient continued to feel bad on Wednesday, he had an episode of fall on Thursday according to the daughter was due to imbalance and vertigo. He was taken to [facility] ED at the time, x-ray did not reveal any acute fracture. However chest x-ray was concerning for possible pneumonia despite being afebrile. He was prophylactically placed on Levaquin with improvement of symptom. He was initially offered inpatient admission, however daughter refused at the time stating that assisted living facility can take care of the patient. He woke up this morning have his  usual dizziness. According to the patient, he sat on the side of the bed for several minutes before getting up. He ate half of his breakfast and was opening refrigerator to get a coke when he passed out. He woke up on the floor for after unknown period of time, crawled to his bedside and pulled alarm. Since it was unwitnessed fall, it is unknown how long he was down, but per daughter the assisted living facility check on the patient every 30 minutes.  He was placed in a Collar by EMS en route to [facility] ED. After arrival, ED provider has sutured the patient's right scalp laceration. While in the ED, he also had some bradycardic episode down into the 40s to 50s on the telemetry. EKG showed sinus bradycardia with heart rate high 40s, right bundle branch block. Chest x-ray was negative for acute process. CT of the head without contrast was negative for intracranial bleeding. Significant laboratory finding include hemoglobin of 12.8, WBC 4.9, creatinine 1.6, troponin 1.65. Cardiology has been consulted for positive troponin, bradycardia and syncope.  Of note, per patient's daughter, he has history of significant BPH, he has not seen his urologist Dr. Raford Pitcher for some time and is scheduled to go.   No current facility-administered medications on file prior to encounter.   Current Outpatient Prescriptions on File Prior to Encounter  Medication Sig Dispense Refill  . acetaminophen (TYLENOL) 500 MG tablet Take 2 tablets (1,000 mg total) by mouth 2 (two) times daily. 60 tablet 11  . ALPRAZolam (XANAX) 0.25 MG tablet TAKE 1 TABLET BY MOUTH TWICE DAILY AS NEEDED* NEEDS OFFICE VISIT 30 tablet 4  . atenolol (TENORMIN) 50 MG tablet Take 1 tablet (50 mg total) by mouth daily. 90 tablet 3  . benzonatate (TESSALON PERLES) 100 MG capsule Take 2 capsules (200 mg total) by mouth 3 (three) times daily as needed for cough. 20 capsule 0  . DEXILANT 60 MG capsule TAKE ONE CAPSULE BY MOUTH EVERY DAY 30 capsule 11    . docusate sodium (COLACE) 100 MG capsule Take 100 mg by mouth at bedtime.    . DULoxetine (CYMBALTA) 30 MG capsule TAKE 1 CAPSULE BY MOUTH DAILY 30 capsule 6  . HYDROcodone-acetaminophen (NORCO) 5-325 MG per tablet Take 1-2 tablets by mouth every 6 (six) hours as needed for moderate pain. 30 tablet 0  . latanoprost (XALATAN) 0.005 % ophthalmic solution Place 1 drop into both eyes at bedtime.    [name] levofloxacin (LEVAQUIN) 500 MG tablet Take 1 tablet (500 mg total) by mouth daily. 6 tablet 0  . meclizine (ANTIVERT) 12.5 MG tablet TAKE 1 TABLET BY MOUTH THREE TIMES DAILY AS NEEDED FOR DIZZINESS 30 tablet 0  . ondansetron (ZOFRAN) 4 MG tablet Take 4 mg by mouth  every 8 (eight) hours as needed for nausea.    . polyethylene glycol powder (GLYCOLAX/MIRALAX) powder MIX 17 GRAMS WITH 4 TO 6 OUNCES OF WATER AND DRINK ONCE DAILY AS NEEDED FOR CONSTIPATION 527 g 2  . promethazine (PHENERGAN) 25 MG tablet Take 1 tablet (25 mg total) by mouth every 6 (six) hours as needed. nausea 30 tablet 1  . Rivaroxaban (XARELTO) 15 MG TABS tablet TAKE 1 TABLET BY MOUTH EVERY DAY WITH SUPPER 30 tablet 4  . Tamsulosin HCl (FLOMAX) 0.4 MG CAPS Take 0.4 mg by mouth daily.     [name] tiZANidine (ZANAFLEX) 2 MG tablet Take 2 mg by mouth every 6 (six) hours as needed (for muscle relaxant).    . zolpidem (AMBIEN) 5 MG tablet Take 5 mg by mouth at bedtime as needed for sleep. For insomnia.    [name] nitroGLYCERIN (NITROSTAT) 0.4 MG SL tablet Place 0.4 mg under the tongue every 5 (five) minutes as needed. Chest pain    . predniSONE (DELTASONE) 10 MG tablet Take 1 tablet (10 mg total) by mouth daily with breakfast. (Patient not taking: Reported on 05/22/2014) 30 tablet 3        Family History Family History  Problem Relation Age of Onset  . Colon cancer Neg Hx   . Coronary artery disease Brother   . Heart attack Father   . Migraines Mother   . Breast cancer Sister   . Dementia Sister   . Renal Disease Brother      Social  History History   Social History  . Marital Status: Married    Spouse Name: N/A  . Number of Children: N/A  . Years of Education: N/A   Occupational History  . Not on file.   Social History Main Topics  . Smoking status: Never Smoker   . Smokeless tobacco: Never Used  . Alcohol Use: No     Comment: Wine occasional  . Drug Use: No  . Sexual Activity: No   Other Topics Concern  . 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  Cardiovascular:  No chest pain, dyspnea on exertion, edema, orthopnea, palpitations, paroxysmal nocturnal dyspnea. Dermatological: No rash, lesions/masses Respiratory: No cough, dyspnea Urologic: No hematuria, dysuria Abdominal:   No nausea, vomiting, diarrhea, bright red blood per rectum, melena, or hematemesis Neurologic:  No visual changes, wkns. Syncope All other systems reviewed and are otherwise negative except as noted above.  Physical Exam  Blood pressure 157/66, pulse 52, resp. rate 21, SpO2 97 %.  General: Pleasant, NAD. Hard of hearing.  Psych: Normal affect.  Neuro: Alert and oriented X 3. Moves all extremities spontaneously. HEENT: Normal   Neck: Supple without bruits or JVD. Lungs:  Resp regular and unlabored, CTA. Heart: RRR no s3, s4, or murmurs. Abdomen: Soft, non-tender, non-distended, BS + x 4.  Extremities: No clubbing, cyanosis or edema. DP/PT/Radials 2+ and equal bilaterally.  Labs  No results for input(s): CKTOTAL, CKMB, TROPONINI in the last 72 hours. Lab Results  Component Value Date   WBC 4.9 05/22/2014   HGB 12.8* 05/22/2014   HCT 38.5* 05/22/2014   MCV 97.2 05/22/2014   PLT 110* 05/22/2014    Recent Labs Lab 05/22/14 1135  NA 139  K 4.0  CL 109  CO2 21  BUN 26*  CREATININE 1.60*  CALCIUM 8.3*  GLUCOSE 125*   Lab Results  Component Value Date   CHOL 211*  01/18/2007   HDL 37.0* 01/18/2007   TRIG 81 01/18/2007   Lab Results  Component Value Date   DDIMER 0.73* 11/13/2010    Radiology/Studies  Dg Chest 2 View  05/21/2014   CLINICAL DATA:  Fall, weakness  EXAM: CHEST  2 VIEW  COMPARISON:  12/28/2012  FINDINGS: Cardiac stress that borderline cardiomegaly. Mild interstitial prominence bilaterally. Mild edema or pneumonitis cannot be excluded. No segmental infiltrate. Mild left basilar atelectasis.  IMPRESSION: Mild interstitial prominence bilaterally. Mild edema or pneumonitis cannot be excluded. No segmental infiltrate. Mild left basilar atelectasis   Electronically Signed   By: Natasha Mead M.D.   On: 05/21/2014 09:33   Dg Pelvis 1-2 Views  05/21/2014   CLINICAL DATA:  Fall  EXAM: PELVIS - 1-2 VIEW  COMPARISON:  None.  FINDINGS: There is no evidence of pelvic fracture or diastasis. No pelvic bone lesions are seen. Pelvic phleboliths are noted. Mild degenerative changes bilateral hip joints.  IMPRESSION: No acute fracture or subluxation. Mild degenerative changes bilateral hip joints.   Electronically Signed   By: Natasha Mead M.D.   On: 05/21/2014 09:34   Dg Forearm Left  05/22/2014   CLINICAL DATA:  Fall.  Pain mid to distal left forearm.  Abrasion.  EXAM: LEFT FOREARM - 2 VIEW  COMPARISON:  None.  FINDINGS: The bones appear osteopenic. There is a subchondral cyst noted within the radial styloid. Patient's IV catheter overlies the ulnar styloid and precludes accurate assessment of this area. No displaced fractures identified.  IMPRESSION: 1. IV catheter overlies the radial styloid in precludes accurate assessment of this area. 2. Remaining portions of the forearm appear intact.   Electronically Signed   By: Signa Kell M.D.   On: 05/22/2014 13:25   Dg Wrist Complete Right  05/21/2014   CLINICAL DATA:  Fall right wrist laceration  EXAM: RIGHT WRIST - COMPLETE 3+ VIEW  COMPARISON:  None.  FINDINGS: Four views of the right wrist submitted. No acute  fracture or subluxation. There is narrowing of radiocarpal joint space. Chondrocalcinosis. Mild degenerative changes first carpometacarpal joint.  IMPRESSION: No acute fracture or subluxation. Narrowing of radiocarpal joint space. Mild chondrocalcinosis radiocarpal joint. Degenerative changes first carpometacarpal joint.   Electronically Signed   By: Natasha Mead M.D.   On: 05/21/2014 09:35   Ct Head Wo Contrast  05/22/2014   CLINICAL DATA:  Syncopal episode  EXAM: CT HEAD WITHOUT CONTRAST  TECHNIQUE: Contiguous axial images were obtained from the base of the skull through the vertex without intravenous contrast.  COMPARISON:  05/21/2014  FINDINGS: New scalp laceration is noted in the right frontal region. No underlying bony abnormality is noted. Atrophic and ischemic changes are again identified stable from the previous day. No findings to suggest acute hemorrhage, acute infarction or space-occupying mass lesion are noted.  IMPRESSION: Chronic atrophic and ischemic changes are seen.  New right frontal scalp laceration   Electronically Signed   By: Alcide Clever M.D.   On: 05/22/2014 13:30   Ct Head Wo Contrast  05/21/2014   CLINICAL DATA:  unwitnessed fall.  EXAM: CT HEAD WITHOUT CONTRAST  TECHNIQUE: Contiguous axial images were obtained from the base of the skull through the vertex without intravenous contrast.  COMPARISON:  12/16/2012  FINDINGS: There is diffuse low attenuation throughout the subcortical and periventricular white matter consistent with chronic microvascular disease. There is prominence of the sulci and ventricles  consistent with brain atrophy. Chronic right basal ganglia lacunar infarcts noted. No acute cortical infarct, hemorrhage, or mass lesion ispresent.No significant extra-axial fluid collection is present. The paranasal sinuses andmastoid air cells are clear. The osseous skull is intact.  IMPRESSION: 1. No acute intracranial abnormalities. 2. Advanced small vessel ischemic disease and  brain atrophy.   Electronically Signed   By: Signa Kell M.D.   On: 05/21/2014 09:07   Dg Chest Portable 1 View  05/22/2014   CLINICAL DATA:  Loss of consciousness.  Cough  EXAM: PORTABLE CHEST - 1 VIEW  COMPARISON:  05/21/2014  FINDINGS: Heart size is normal. There is no pleural effusion or edema. Lung volumes are low. No airspace consolidation identified.  IMPRESSION: 1. No acute cardiopulmonary abnormalities. 2. Low lung volumes.   Electronically Signed   By: Signa Kell M.D.   On: 05/22/2014 12:13    ECG  NSR with RBBB  ASSESSMENT AND PLAN  1. Syncope  - No significant bradycardia observed, given recurrent dizziness in the morning with positional changes, will need to obtain orthostatic vital signs  - Syncope unlikely driven by bradycardia, no immediate need for PPM, if significant bradycardia, will decrease BB dose  - patient has multiple problems, likely benefit more from IM admission  2. Elevated troponin, without chest pain  - Per patient, he denies any recent chest pain, endorse occasional arm pain during water aerobic, unclear if related to cardiac issue vs musculoskeletal  - Trend troponin overnight, would only do conservative treatment in this 81 yo male on chronic systemic anticoagulation if echo shows no significant WMA  - obtain echocardiogram   3. Acute on chronic renal insufficiency  - Unclear if related to recent illness/dehydration versus post renal with severe BPH, likely combination of the two  4. URI: improving after Levaquin  5. CAD status post PCI to OM in October 2007  6. hypercoagulable state with recurrent DVT/PE on Xarelto 7. TIA 8. PVD 9. PAF 10. BPH follow by Dr. Narda Bonds? per daughter 82. R scalp laceration s/p repair by ED  Signed, Azalee Course, PA-C 05/22/2014, 2:24 PM Patient seen and examined and history reviewed. Agree with above findings and plan. 81 yo WM presents with syncope associated with scalp laceration. No apparent warning symptoms.  He has had recent falls related to becoming lightheaded and losing balance. Recent cough treated with Levaquin. No chest pain or SOB. He was started on atenolol for BP fairly recently. BP has been labile. Exam reveals a scalp laceration. He is in a C collar. Lungs are clear anteriorly. No gallop or murmur. HR is slow in 50s. BP is OK. Unable to check orthostatics at this time. Ecg shows no acute change. QTc is prolonged possibly due to Levaquin. I suspect troponin elevation is due to demand ischemia. I recommend reducing atenolol to 25 mg daily due to bradycardia. Will need to check orthostatics when able to be positioned. Will check Echo. Stop Levaquin due to prolonged QT.  I would not pursue ischemic work up at his advanced age. Will follow with hospitalist team. CT of cervical spine is pending.   Jason [name], MDFACC 05/22/2014 3:44 PM

## 2014-05-22 NOTE — H&P (Signed)
History and Physical  Jason [name] ZOX:[id number] DOB: 08/18/1919 DOA: 05/22/2014  Referring physician: Dr. Ross Marcusourtney [name], EDP PCP: Jason GrosserPICKARD,[name], MD  Outpatient Specialists:  1. Cardiology: Dr. Tonny BollmanMichael [name] 2. GI: Dr. Stan Headarl [name] 3. Urology: Dr. Emeline GinsStephen [name]  Chief Complaint: Passed out and fell.  HPI: Jason [name] is a 81 y.o. 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 Patient and patient's daughter at bedside provided history. Patient lives at an assisted living facility and ambulates with the help of a walker. He has chronic vertigo and has unsteady gait with several near falls or falls. His wife is in residential hospice. 4 days back patient developed low-grade fever and nonproductive cough which did not subside with Jerilynn Somessalon Perles provided by PCP. He denied chest pain or dyspnea. Poor appetite. Yesterday patient got up to urinate, felt unsteady and fell sustaining injury to the right forearm. He was seen in the ED >CT head negative, x-rays of right wrist and pelvis negative and patient was discharged home on oral levofloxacin. Patient's daughter and other family members stayed with patient through the night until 6 this morning. Daughter was trying to arrange somebody to be with him. At approximately 10:15 AM, patient got up from his bed and took 2 steps to get a drink from the refrigerator and passed out without premonitory symptoms such as dizziness, lightheadedness, chest pain or palpitations. He can't remember how long he was unconscious. He seemed to have hit his forehead to a table holding the refrigerator. After he woke up, he crawled to the bed and reached for the alarm cord and EMS brought him to the ED. In the ED, CT head negative for acute findings. CT neck pending.  Creatinine 1.6, hemoglobin 12.8, platelets 110 and troponin elevated. Cardiology has evaluated. Right frontal scalp laceration sutured by EDP. Hospitalist admission requested. Patient has history of chronic urinary frequency and has to urinate every 2 hours. No dysuria. Chronic left shoulder pain-unchanged.   Review of Systems: All systems reviewed and apart from history of presenting illness, are negative.  Past Medical History  Diagnosis Date  . PVD (peripheral vascular disease)   . CAD (coronary artery disease)     a. 12/2005 - PCI to OM  . Hyperlipidemia   . Cerebrovascular accident   . DVT femoral (deep venous thrombosis) with thrombophlebitis   . GERD (gastroesophageal reflux disease)   . Edema   . Anxiety   . Vertigo   . IBS (irritable bowel syndrome)   . Duodenitis   . Gastritis   . Esophageal stricture   . Cellulitis   . Pulmonary embolism   . Prostatic hypertrophy   . Frequent falls   . Subdural hematoma   . PTSD (post-traumatic stress disorder)   . Vertigo   . TIA (transient ischemic attack)     a. 01/2006 and 05/2006.   [name]. Hiatal hernia     periferal neuropathy  . Hx of echocardiogram     Echo 9/13:  Mild LVH, EF 60-65%, Gr 1 diast dysfn, mild AI, mild BAE  . Gout   . Chronic low back pain   . Gait disorder   . Peripheral neuropathy   . Hydrocele   . Migraine   . History of shingles     Left lumbar  .  HOH (hard of hearing)     hearing aid   Past Surgical History  Procedure Laterality Date  . Hand surgery    . Angioplasty    . Carotid endarterectomy      Left  . Bare-metal stenting      left circumflex  . Bilateral cataract surgery     Social History:  reports that he has never smoked. He has never used smokeless tobacco. He reports that he does not drink alcohol or use illicit drugs. Married. Lives at assisted living facility. Ambulates with the help of a walker. Unsteady gait.  Allergies  Allergen Reactions  . Augmentin [Amoxicillin-Pot  Clavulanate] Nausea And Vomiting    daughter states he can tolerate Amoxicillin ok  . Fludrocortisone Acetate Other (See Comments)    unknown  . Scopace [Scopolamine] Other (See Comments)    *patch Unknown reaction    Family History  Problem Relation Age of Onset  . Colon cancer Neg Hx   . Coronary artery disease Brother   . Heart attack Father   . Migraines Mother   . Breast cancer Sister   . Dementia Sister   . Renal Disease Brother     Prior to Admission medications   Medication Sig Start Date End Date Taking? Authorizing Provider  acetaminophen (TYLENOL) 500 MG tablet Take 2 tablets (1,000 mg total) by mouth 2 (two) times daily. 01/13/14  Yes Donita Brooks, MD  ALPRAZolam Prudy Feeler) 0.25 MG tablet TAKE 1 TABLET BY MOUTH TWICE DAILY AS NEEDED* NEEDS OFFICE VISIT 09/04/13  Yes York Spaniel, MD  atenolol (TENORMIN) 50 MG tablet Take 1 tablet (50 mg total) by mouth daily. 04/13/14  Yes Donita Brooks, MD  benzonatate (TESSALON PERLES) 100 MG capsule Take 2 capsules (200 mg total) by mouth 3 (three) times daily as needed for cough. 05/19/14  Yes Donita Brooks, MD  DEXILANT 60 MG capsule TAKE ONE CAPSULE BY MOUTH EVERY DAY 03/16/14  Yes Donita Brooks, MD  docusate sodium (COLACE) 100 MG capsule Take 100 mg by mouth at bedtime.   Yes Historical Provider, MD  DULoxetine (CYMBALTA) 30 MG capsule TAKE 1 CAPSULE BY MOUTH DAILY 02/19/14  Yes York Spaniel, MD  HYDROcodone-acetaminophen (NORCO) 5-325 MG per tablet Take 1-2 tablets by mouth every 6 (six) hours as needed for moderate pain. 01/13/14  Yes Donita Brooks, MD  latanoprost (XALATAN) 0.005 % ophthalmic solution Place 1 drop into both eyes at bedtime.   Yes Historical Provider, MD  meclizine (ANTIVERT) 12.5 MG tablet TAKE 1 TABLET BY MOUTH THREE TIMES DAILY AS NEEDED FOR DIZZINESS 09/29/13  Yes Donita Brooks, MD  ondansetron (ZOFRAN) 4 MG tablet Take 4 mg by mouth every 8 (eight) hours as needed for nausea.   Yes Historical  Provider, MD  polyethylene glycol powder (GLYCOLAX/MIRALAX) powder MIX 17 GRAMS WITH 4 TO 6 OUNCES OF WATER AND DRINK ONCE DAILY AS NEEDED FOR CONSTIPATION 03/07/13  Yes Donita Brooks, MD  promethazine (PHENERGAN) 25 MG tablet Take 1 tablet (25 mg total) by mouth every 6 (six) hours as needed. nausea 12/12/12  Yes Donita Brooks, MD  Rivaroxaban (XARELTO) 15 MG TABS tablet TAKE 1 TABLET BY MOUTH EVERY DAY WITH SUPPER 03/18/14  Yes Tonny Bollman, MD  Tamsulosin HCl (FLOMAX) 0.4 MG CAPS Take 0.4 mg by mouth daily.    Yes Historical Provider, MD  tiZANidine (ZANAFLEX) 2 MG tablet Take 2 mg by mouth every 6 (six) hours as needed (for  muscle relaxant).   Yes Historical Provider, MD  zolpidem (AMBIEN) 5 MG tablet Take 5 mg by mouth at bedtime as needed for sleep. For insomnia.   Yes Historical Provider, MD  nitroGLYCERIN (NITROSTAT) 0.4 MG SL tablet Place 0.4 mg under the tongue every 5 (five) minutes as needed. Chest pain    Historical Provider, MD   Physical Exam: Filed Vitals:   05/22/14 1400 05/22/14 1415 05/22/14 1430 05/22/14 1545  BP: 157/66 158/64 151/68 165/67  Pulse: 52 53 50 52  Resp: SpO2: 97% 99% 96% 96%   temperature: 99.23F.   General exam: Moderately built and nourished pleasant elderly male patient, lying comfortably supine on the gurney in no obvious distress.  Head, eyes and ENT: Sutured right forehead laceration. Normocephalic. Pupils equally reacting to light and accommodation. Oral mucosa dry.  Neck: Cervical collar in place. No JVD, carotid bruit or thyromegaly.  Lymphatics: No lymphadenopathy.  Respiratory system: Clear to auscultation. No increased work of breathing.  Cardiovascular system: S1 and S2 heard, regular and mildly bradycardic. No JVD, murmurs, gallops, clicks or pedal edema.  Gastrointestinal system: Abdomen is nondistended, soft and nontender. Normal bowel sounds heard. No organomegaly or masses appreciated.  Central nervous system:  Alert and oriented. No focal neurological deficits. Extremely hard of hearing in right ear.  Extremities: Symmetric 5 x 5 power. Peripheral pulses symmetrically felt. Right forearm splint and superficial abrasion at the base of the thumb without acute findings.  Skin: No rashes or acute findings.  Musculoskeletal system: Negative exam.  Psychiatry: Pleasant and cooperative.   Labs on Admission:  Basic Metabolic Panel:  Recent Labs Lab 05/22/14 1135  NA 139  K 4.0  CL 109  CO2 21  GLUCOSE 125*  BUN 26*  CREATININE 1.60*  CALCIUM 8.3*   Liver Function Tests: No results for input(s): AST, ALT, ALKPHOS, BILITOT, PROT, ALBUMIN in the last 168 hours. No results for input(s): LIPASE, AMYLASE in the last 168 hours. No results for input(s): AMMONIA in the last 168 hours. CBC:  Recent Labs Lab 05/22/14 1135  WBC 4.9  NEUTROABS 3.0  HGB 12.8*  HCT 38.5*  MCV 97.2  PLT 110*   Cardiac Enzymes: No results for input(s): CKTOTAL, CKMB, CKMBINDEX, TROPONINI in the last 168 hours.  BNP (last 3 results) No results for input(s): PROBNP in the last 8760 hours. CBG: No results for input(s): GLUCAP in the last 168 hours.  Radiological Exams on Admission: Dg Chest 2 View  05/21/2014   CLINICAL DATA:  Fall, weakness  EXAM: CHEST  2 VIEW  COMPARISON:  12/28/2012  FINDINGS: Cardiac stress that borderline cardiomegaly. Mild interstitial prominence bilaterally. Mild edema or pneumonitis cannot be excluded. No segmental infiltrate. Mild left basilar atelectasis.  IMPRESSION: Mild interstitial prominence bilaterally. Mild edema or pneumonitis cannot be excluded. No segmental infiltrate. Mild left basilar atelectasis   Electronically Signed   By: Natasha Mead M.D.   On: 05/21/2014 09:33   Dg Pelvis 1-2 Views  05/21/2014   CLINICAL DATA:  Fall  EXAM: PELVIS - 1-2 VIEW  COMPARISON:  None.  FINDINGS: There is no evidence of pelvic fracture or diastasis. No pelvic bone lesions are seen. Pelvic  phleboliths are noted. Mild degenerative changes bilateral hip joints.  IMPRESSION: No acute fracture or subluxation. Mild degenerative changes bilateral hip joints.   Electronically Signed   By: Natasha Mead M.D.   On: 05/21/2014 09:34   Dg Forearm Left  05/22/2014   CLINICAL  DATA:  Fall.  Pain mid to distal left forearm.  Abrasion.  EXAM: LEFT FOREARM - 2 VIEW  COMPARISON:  None.  FINDINGS: The bones appear osteopenic. There is a subchondral cyst noted within the radial styloid. Patient's IV catheter overlies the ulnar styloid and precludes accurate assessment of this area. No displaced fractures identified.  IMPRESSION: 1. IV catheter overlies the radial styloid in precludes accurate assessment of this area. 2. Remaining portions of the forearm appear intact.   Electronically Signed   By: Signa Kell M.D.   On: 05/22/2014 13:25   Dg Wrist Complete Right  05/21/2014   CLINICAL DATA:  Fall right wrist laceration  EXAM: RIGHT WRIST - COMPLETE 3+ VIEW  COMPARISON:  None.  FINDINGS: Four views of the right wrist submitted. No acute fracture or subluxation. There is narrowing of radiocarpal joint space. Chondrocalcinosis. Mild degenerative changes first carpometacarpal joint.  IMPRESSION: No acute fracture or subluxation. Narrowing of radiocarpal joint space. Mild chondrocalcinosis radiocarpal joint. Degenerative changes first carpometacarpal joint.   Electronically Signed   By: Natasha Mead M.D.   On: 05/21/2014 09:35   Ct Head Wo Contrast  05/22/2014   CLINICAL DATA:  Syncopal episode  EXAM: CT HEAD WITHOUT CONTRAST  TECHNIQUE: Contiguous axial images were obtained from the base of the skull through the vertex without intravenous contrast.  COMPARISON:  05/21/2014  FINDINGS: New scalp laceration is noted in the right frontal region. No underlying bony abnormality is noted. Atrophic and ischemic changes are again identified stable from the previous day. No findings to suggest acute hemorrhage, acute infarction  or space-occupying mass lesion are noted.  IMPRESSION: Chronic atrophic and ischemic changes are seen.  New right frontal scalp laceration   Electronically Signed   By: Alcide Clever M.D.   On: 05/22/2014 13:30   Ct Head Wo Contrast  05/21/2014   CLINICAL DATA:  unwitnessed fall.  EXAM: CT HEAD WITHOUT CONTRAST  TECHNIQUE: Contiguous axial images were obtained from the base of the skull through the vertex without intravenous contrast.  COMPARISON:  12/16/2012  FINDINGS: There is diffuse low attenuation throughout the subcortical and periventricular white matter consistent with chronic microvascular disease. There is prominence of the sulci and ventricles consistent with brain atrophy. Chronic right basal ganglia lacunar infarcts noted. No acute cortical infarct, hemorrhage, or mass lesion ispresent.No significant extra-axial fluid collection is present. The paranasal sinuses andmastoid air cells are clear. The osseous skull is intact.  IMPRESSION: 1. No acute intracranial abnormalities. 2. Advanced small vessel ischemic disease and brain atrophy.   Electronically Signed   By: Signa Kell M.D.   On: 05/21/2014 09:07   Nm Pulmonary Perf And Vent  05/22/2014   CLINICAL DATA:  History of pulmonary embolus.  On blood thinner.  EXAM: NUCLEAR MEDICINE VENTILATION - PERFUSION LUNG SCAN  TECHNIQUE: Ventilation images were obtained in multiple projections using inhaled aerosol technetium 99 M DTPA. Perfusion images were obtained in multiple projections after intravenous injection of Tc-91m MAA.  RADIOPHARMACEUTICALS:  Six mCi Tc-11m DTPA aerosol and 40 mCi Tc-26m MAA  COMPARISON:  CXR 05/22/2014  FINDINGS: Ventilation: No focal ventilation defect.  Perfusion: No wedge shaped peripheral perfusion defects to suggest acute pulmonary embolism.  IMPRESSION: Very low probability for pulmonary embolism.   Electronically Signed   By: Marlan Palau M.D.   On: 05/22/2014 15:26   Dg Chest Portable 1 View  05/22/2014    CLINICAL DATA:  Loss of consciousness.  Cough  EXAM: PORTABLE CHEST -  1 VIEW  COMPARISON:  05/21/2014  FINDINGS: Heart size is normal. There is no pleural effusion or edema. Lung volumes are low. No airspace consolidation identified.  IMPRESSION: 1. No acute cardiopulmonary abnormalities. 2. Low lung volumes.   Electronically Signed   By: Signa Kell M.D.   On: 05/22/2014 12:13    EKG: Independently reviewed. Sinus bradycardia at 49 bpm, RBBB, PACs and QTC 488 ms  Assessment/Plan Principal Problem:   Syncope and collapse Active Problems:   HYPERTENSION, BENIGN   VERTIGO   Thrombocytopenia   PAF (paroxysmal atrial fibrillation)   Breast pain   Scalp laceration   Acute renal failure superimposed on stage 3 chronic kidney disease   Acute bronchitis   1. Syncope and collapse: DD-orthostatic hypotension, bradycardia (less likely), other arrhythmias, structural heart disease, vasovagal. Admit to telemetry. Check orthostatic blood pressures. We will get 2-D echo. Hydrate with IV fluids. Reduce atenolol to dose. PT and OT evaluation. Cardiology has consulted. VQ scan: Low probability for PE 2. Dehydration: Related to URI and poor oral intake. Briefly hydrate with IV fluids. 3. Acute on stage III chronic kidney disease: Precipitated by dehydration. Brief IV fluids. If creatinine does not improve, may consider renal ultrasound to rule out obstruction. 4. BPH with urinary frequency: Will check post void residual. Continue Flomax. Please discuss with patient's primary urologist in a.m. regarding management. 5. Elevated troponin/likely demand ischemia: No acute EKG changes. No chest pain. Cardiology has consulted. Trend troponin. Check 2-D echo. 6. Acute viral bronchitis: Chest x-ray negative for acute findings. DC levofloxacin and treat supportively. 7. History of recurrent DVT/PE & PAF: Hold xarelto (as discussed with cardiology) for today secondary to scalp laceration and suturing. Patient has  history of chronic dizziness/vertigo, unsteady gait and frequent falls/near falls. May need to consider if he is safe anymore to continue anticoagulation given this fall risk.? If SNF will reduce fall risk. 8. Right frontal scalp laceration: Status post suturing. 9. Prolonged QTC: DC levofloxacin. Check magnesium. Potassium okay. Follow on telemetry. 10. Thrombocytopenia: Follow CBC 11. PAF: Sinus bradycardia. Reduced atenolol. Anticoagulation discussion as above. 12. Hard of hearing     Code Status: Full  Family Communication: Discussed with patient's daughter at bedside.  Disposition Plan: To be determined post PT and OT evaluation. May need higher level of care.   Time spent: 60 minutes  Jason Wojtowicz, MD, FACP, FHM. [facility] Pager [phone]  If 7PM-7AM, please contact night-coverage [email or web address] Password [facility] 05/22/2014, 4:34 PM

## 2014-05-22 NOTE — Telephone Encounter (Signed)
Ok with tessalon perles but that is what I gave them earlier this week.

## 2014-05-22 NOTE — ED Provider Notes (Signed)
CSN: [id number]     Arrival date & time 05/22/14  1112 History   First MD Initiated Contact with Patient 05/22/14 1114     Chief Complaint  Patient presents with  . Fall     (Consider location/radiation/quality/duration/timing/severity/associated sxs/prior Treatment) HPI  This is a 81 year old male with history of peripheral vascular disease, coronary artery disease, CVA, DVT/PE who presents following a fall.  Patient reports that he got up to get something to drink in the next thing he knew he was on the floor. He did not feel like he was going to pass out. Noted by EMS to be hypoxic and requiring 2 L of fluid. Patient was seen and evaluated yesterday for a fall. At that time head CT was negative. Patient is currently on Xarelto.  Patient noted to have a large laceration over the right for head. Was being treated for pneumonia following a chest x-ray yesterday. Patient endorses shortness of breath denies chest pain. Does endorse left shoulder pain. Denies any lower extremity swelling.  Past Medical History  Diagnosis Date  . PVD (peripheral vascular disease)   . CAD (coronary artery disease)     a. 12/2005 - PCI to OM  . Hyperlipidemia   . Cerebrovascular accident   . DVT femoral (deep venous thrombosis) with thrombophlebitis   . GERD (gastroesophageal reflux disease)   . Edema   . Anxiety   . Vertigo   . IBS (irritable bowel syndrome)   . Duodenitis   . Gastritis   . Esophageal stricture   . Cellulitis   . Pulmonary embolism   . Prostatic hypertrophy   . Frequent falls   . Subdural hematoma   . PTSD (post-traumatic stress disorder)   . Vertigo   . TIA (transient ischemic attack)     a. 01/2006 and 05/2006.   [name] Hiatal hernia     periferal neuropathy  . Hx of echocardiogram     Echo 9/13:  Mild LVH, EF 60-65%, Gr 1 diast dysfn, mild AI, mild BAE  . Gout   . Chronic low back pain   . Gait disorder   . Peripheral neuropathy   . Hydrocele   . Migraine   . History of  shingles     Left lumbar  . HOH (hard of hearing)     hearing aid   Past Surgical History  Procedure Laterality Date  . Hand surgery    . Angioplasty    . Carotid endarterectomy      Left  . Bare-metal stenting      left circumflex  . Bilateral cataract surgery     Family History  Problem Relation Age of Onset  . Colon cancer Neg Hx   . Coronary artery disease Brother   . Heart attack Father   . Migraines Mother   . Breast cancer Sister   . Dementia Sister   . Renal Disease Brother    History  Substance Use Topics  . Smoking status: Never Smoker   . Smokeless tobacco: Never Used  . Alcohol Use: No     Comment: Wine occasional    Review of Systems  Constitutional: Negative.  Negative for fever.  Respiratory: Positive for shortness of breath. Negative for chest tightness.   Cardiovascular: Negative.  Negative for chest pain.  Gastrointestinal: Negative.  Negative for abdominal pain.  Genitourinary: Negative.  Negative for dysuria.  Musculoskeletal: Negative for back pain.  Skin: Positive for wound. Negative for rash.  Neurological: Positive for syncope  and headaches. Negative for weakness and numbness.  All other systems reviewed and are negative.     Allergies  Augmentin; Fludrocortisone acetate; and Scopace  Home Medications   Prior to Admission medications   Medication Sig Start Date End Date Taking? Authorizing Provider  acetaminophen (TYLENOL) 500 MG tablet Take 2 tablets (1,000 mg total) by mouth 2 (two) times daily. 01/13/14  Yes Donita Brooks, MD  ALPRAZolam Prudy Feeler) 0.25 MG tablet TAKE 1 TABLET BY MOUTH TWICE DAILY AS NEEDED* NEEDS OFFICE VISIT 09/04/13  Yes York Spaniel, MD  atenolol (TENORMIN) 50 MG tablet Take 1 tablet (50 mg total) by mouth daily. 04/13/14  Yes Donita Brooks, MD  benzonatate (TESSALON PERLES) 100 MG capsule Take 2 capsules (200 mg total) by mouth 3 (three) times daily as needed for cough. 05/19/14  Yes Donita Brooks, MD   DEXILANT 60 MG capsule TAKE ONE CAPSULE BY MOUTH EVERY DAY 03/16/14  Yes Donita Brooks, MD  docusate sodium (COLACE) 100 MG capsule Take 100 mg by mouth at bedtime.   Yes Historical Provider, MD  DULoxetine (CYMBALTA) 30 MG capsule TAKE 1 CAPSULE BY MOUTH DAILY 02/19/14  Yes York Spaniel, MD  HYDROcodone-acetaminophen (NORCO) 5-325 MG per tablet Take 1-2 tablets by mouth every 6 (six) hours as needed for moderate pain. 01/13/14  Yes Donita Brooks, MD  latanoprost (XALATAN) 0.005 % ophthalmic solution Place 1 drop into both eyes at bedtime.   Yes Historical Provider, MD  levofloxacin (LEVAQUIN) 500 MG tablet Take 1 tablet (500 mg total) by mouth daily. 05/21/14  Yes Ankit Rhunette Croft, MD  meclizine (ANTIVERT) 12.5 MG tablet TAKE 1 TABLET BY MOUTH THREE TIMES DAILY AS NEEDED FOR DIZZINESS 09/29/13  Yes Donita Brooks, MD  ondansetron (ZOFRAN) 4 MG tablet Take 4 mg by mouth every 8 (eight) hours as needed for nausea.   Yes Historical Provider, MD  polyethylene glycol powder (GLYCOLAX/MIRALAX) powder MIX 17 GRAMS WITH 4 TO 6 OUNCES OF WATER AND DRINK ONCE DAILY AS NEEDED FOR CONSTIPATION 03/07/13  Yes Donita Brooks, MD  promethazine (PHENERGAN) 25 MG tablet Take 1 tablet (25 mg total) by mouth every 6 (six) hours as needed. nausea 12/12/12  Yes Donita Brooks, MD  Rivaroxaban (XARELTO) 15 MG TABS tablet TAKE 1 TABLET BY MOUTH EVERY DAY WITH SUPPER 03/18/14  Yes Tonny Bollman, MD  Tamsulosin HCl (FLOMAX) 0.4 MG CAPS Take 0.4 mg by mouth daily.    Yes Historical Provider, MD  tiZANidine (ZANAFLEX) 2 MG tablet Take 2 mg by mouth every 6 (six) hours as needed (for muscle relaxant).   Yes Historical Provider, MD  zolpidem (AMBIEN) 5 MG tablet Take 5 mg by mouth at bedtime as needed for sleep. For insomnia.   Yes Historical Provider, MD  nitroGLYCERIN (NITROSTAT) 0.4 MG SL tablet Place 0.4 mg under the tongue every 5 (five) minutes as needed. Chest pain    Historical Provider, MD  predniSONE  (DELTASONE) 10 MG tablet Take 1 tablet (10 mg total) by mouth daily with breakfast. Patient not taking: Reported on 05/22/2014 01/13/14   Donita Brooks, MD   BP 151/68 mmHg  Pulse 50  Resp 20  SpO2 96% Physical Exam  Constitutional: He is oriented to person, place, and time. No distress.  Elderly  HENT:  Head: Normocephalic.  6 cm laceration, jagged over the right for head, bleeding controlled  Eyes: EOM are normal. Pupils are equal, round, and reactive to light.  Neck:  C-collar in place  Cardiovascular: Regular rhythm and normal heart sounds.   No murmur heard. Bradycardia  Pulmonary/Chest: Effort normal and breath sounds normal. No respiratory distress. He has no wheezes.  Abdominal: Soft. Bowel sounds are normal. There is no tenderness. There is no rebound.  Musculoskeletal: He exhibits no edema.  Neurological: He is alert and oriented to person, place, and time.  5 out of 5 strength in all 4 extremities  Skin: Skin is warm and dry.  Skin tear left upper extremity  Psychiatric: He has a normal mood and affect.  Nursing note and vitals reviewed.   ED Course  Procedures (including critical care time) Labs Review Labs Reviewed  CBC WITH DIFFERENTIAL/PLATELET - Abnormal; Notable for the following:    RBC 3.96 (*)    Hemoglobin 12.8 (*)    HCT 38.5 (*)    RDW 16.2 (*)    Platelets 110 (*)    All other components within normal limits  BASIC METABOLIC PANEL - Abnormal; Notable for the following:    Glucose, Bld 125 (*)    BUN 26 (*)    Creatinine, Ser 1.60 (*)    Calcium 8.3 (*)    GFR calc non Af Amer 36 (*)    GFR calc Af Amer 41 (*)    All other components within normal limits  URINALYSIS, ROUTINE W REFLEX MICROSCOPIC - Abnormal; Notable for the following:    APPearance HAZY (*)    Hgb urine dipstick LARGE (*)    Protein, ur 30 (*)    All other components within normal limits  URINE MICROSCOPIC-ADD ON - Abnormal; Notable for the following:    Casts GRANULAR  CAST (*)    All other components within normal limits  I-STAT TROPOININ, ED - Abnormal; Notable for the following:    Troponin i, poc 1.65 (*)    All other components within normal limits  HIV RAPID SCREEN (BLD OR BODY FLD EXPOSURE)  HEPATITIS B SURFACE ANTIGEN  HEPATITIS C ANTIBODY (REFLEX)    Imaging Review Dg Chest 2 View  05/21/2014   CLINICAL DATA:  Fall, weakness  EXAM: CHEST  2 VIEW  COMPARISON:  12/28/2012  FINDINGS: Cardiac stress that borderline cardiomegaly. Mild interstitial prominence bilaterally. Mild edema or pneumonitis cannot be excluded. No segmental infiltrate. Mild left basilar atelectasis.  IMPRESSION: Mild interstitial prominence bilaterally. Mild edema or pneumonitis cannot be excluded. No segmental infiltrate. Mild left basilar atelectasis   Electronically Signed   By: Natasha MeadLiviu  Pop M.D.   On: 05/21/2014 09:33   Dg Pelvis 1-2 Views  05/21/2014   CLINICAL DATA:  Fall  EXAM: PELVIS - 1-2 VIEW  COMPARISON:  None.  FINDINGS: There is no evidence of pelvic fracture or diastasis. No pelvic bone lesions are seen. Pelvic phleboliths are noted. Mild degenerative changes bilateral hip joints.  IMPRESSION: No acute fracture or subluxation. Mild degenerative changes bilateral hip joints.   Electronically Signed   By: Natasha MeadLiviu  Pop M.D.   On: 05/21/2014 09:34   Dg Forearm Left  05/22/2014   CLINICAL DATA:  Fall.  Pain mid to distal left forearm.  Abrasion.  EXAM: LEFT FOREARM - 2 VIEW  COMPARISON:  None.  FINDINGS: The bones appear osteopenic. There is a subchondral cyst noted within the radial styloid. Patient's IV catheter overlies the ulnar styloid and precludes accurate assessment of this area. No displaced fractures identified.  IMPRESSION: 1. IV catheter overlies the radial styloid in precludes accurate assessment of this area. 2. Remaining portions  of the forearm appear intact.   Electronically Signed   By: Signa Kell M.D.   On: 05/22/2014 13:25   Dg Wrist Complete  Right  05/21/2014   CLINICAL DATA:  Fall right wrist laceration  EXAM: RIGHT WRIST - COMPLETE 3+ VIEW  COMPARISON:  None.  FINDINGS: Four views of the right wrist submitted. No acute fracture or subluxation. There is narrowing of radiocarpal joint space. Chondrocalcinosis. Mild degenerative changes first carpometacarpal joint.  IMPRESSION: No acute fracture or subluxation. Narrowing of radiocarpal joint space. Mild chondrocalcinosis radiocarpal joint. Degenerative changes first carpometacarpal joint.   Electronically Signed   By: Natasha Mead M.D.   On: 05/21/2014 09:35   Ct Head Wo Contrast  05/22/2014   CLINICAL DATA:  Syncopal episode  EXAM: CT HEAD WITHOUT CONTRAST  TECHNIQUE: Contiguous axial images were obtained from the base of the skull through the vertex without intravenous contrast.  COMPARISON:  05/21/2014  FINDINGS: New scalp laceration is noted in the right frontal region. No underlying bony abnormality is noted. Atrophic and ischemic changes are again identified stable from the previous day. No findings to suggest acute hemorrhage, acute infarction or space-occupying mass lesion are noted.  IMPRESSION: Chronic atrophic and ischemic changes are seen.  New right frontal scalp laceration   Electronically Signed   By: Alcide Clever M.D.   On: 05/22/2014 13:30   Ct Head Wo Contrast  05/21/2014   CLINICAL DATA:  unwitnessed fall.  EXAM: CT HEAD WITHOUT CONTRAST  TECHNIQUE: Contiguous axial images were obtained from the base of the skull through the vertex without intravenous contrast.  COMPARISON:  12/16/2012  FINDINGS: There is diffuse low attenuation throughout the subcortical and periventricular white matter consistent with chronic microvascular disease. There is prominence of the sulci and ventricles consistent with brain atrophy. Chronic right basal ganglia lacunar infarcts noted. No acute cortical infarct, hemorrhage, or mass lesion ispresent.No significant extra-axial fluid collection is present.  The paranasal sinuses andmastoid air cells are clear. The osseous skull is intact.  IMPRESSION: 1. No acute intracranial abnormalities. 2. Advanced small vessel ischemic disease and brain atrophy.   Electronically Signed   By: Signa Kell M.D.   On: 05/21/2014 09:07   Nm Pulmonary Perf And Vent  05/22/2014   CLINICAL DATA:  History of pulmonary embolus.  On blood thinner.  EXAM: NUCLEAR MEDICINE VENTILATION - PERFUSION LUNG SCAN  TECHNIQUE: Ventilation images were obtained in multiple projections using inhaled aerosol technetium 99 M DTPA. Perfusion images were obtained in multiple projections after intravenous injection of Tc-21m MAA.  RADIOPHARMACEUTICALS:  Six mCi Tc-50m DTPA aerosol and 40 mCi Tc-40m MAA  COMPARISON:  CXR 05/22/2014  FINDINGS: Ventilation: No focal ventilation defect.  Perfusion: No wedge shaped peripheral perfusion defects to suggest acute pulmonary embolism.  IMPRESSION: Very low probability for pulmonary embolism.   Electronically Signed   By: Marlan Palau M.D.   On: 05/22/2014 15:26   Dg Chest Portable 1 View  05/22/2014   CLINICAL DATA:  Loss of consciousness.  Cough  EXAM: PORTABLE CHEST - 1 VIEW  COMPARISON:  05/21/2014  FINDINGS: Heart size is normal. There is no pleural effusion or edema. Lung volumes are low. No airspace consolidation identified.  IMPRESSION: 1. No acute cardiopulmonary abnormalities. 2. Low lung volumes.   Electronically Signed   By: Signa Kell M.D.   On: 05/22/2014 12:13     EKG Interpretation   Date/Time:  Friday May 22 2014 11:29:36 EST Ventricular Rate:  49 PR Interval:  156 QRS Duration: 148 QT Interval:  541 QTC Calculation: 488 R Axis:   51 Text Interpretation:  Sinus bradycardia Atrial premature complex Right  bundle branch block Confirmed by Davidmichael Zarazua  MD, Vicie Cech ([id number]) on 05/22/2014  11:43:23 AM      MDM   Final diagnoses:  Hx of pulmonary embolus  Syncope, unspecified syncope type  Facial laceration, initial encounter   Contusion    Patient presents with syncope. History of PE and on Xarelto. Sustained facial laceration.  Sinus bradycardia the 50s. Initial workup notable for troponin of 1.6.  Denies any chest pain at this time. We'll hold anticoagulation given head trauma. Plain films obtained and reassuring. CT scan of the head negative for intracranial blood. Given history of PE and elevated troponin, will obtain a VQ scan. VQ scan low probability for PE. Cardiology consulted regarding elevated troponin. Will admit for serial enzymes and echocardiogram. Will admit to the hospital service for syncope.   Shon Baton, MD 05/22/14 1600

## 2014-05-23 DIAGNOSIS — N3281 Overactive bladder: Secondary | ICD-10-CM

## 2014-05-23 LAB — COMPREHENSIVE METABOLIC PANEL
ALK PHOS: 53 U/L (ref 39–117)
ALT: 17 U/L (ref 0–53)
ANION GAP: 8 (ref 5–15)
AST: 38 U/L — AB (ref 0–37)
Albumin: 2.7 g/dL — ABNORMAL LOW (ref 3.5–5.2)
BUN: 21 mg/dL (ref 6–23)
CO2: 22 mmol/L (ref 19–32)
Calcium: 8 mg/dL — ABNORMAL LOW (ref 8.4–10.5)
Chloride: 111 mmol/L (ref 96–112)
Creatinine, Ser: 1.37 mg/dL — ABNORMAL HIGH (ref 0.50–1.35)
GFR calc Af Amer: 50 mL/min — ABNORMAL LOW (ref >90)
GFR calc non Af Amer: 43 mL/min — ABNORMAL LOW (ref >90)
Glucose, Bld: 85 mg/dL (ref 70–99)
Potassium: 3.6 mmol/L (ref 3.5–5.1)
SODIUM: 141 mmol/L (ref 135–145)
Total Bilirubin: 0.7 mg/dL (ref 0.3–1.2)
Total Protein: 6.3 g/dL (ref 6.0–8.3)

## 2014-05-23 LAB — CBC
HCT: 37.9 % — ABNORMAL LOW (ref 39.0–52.0)
Hemoglobin: 12.4 g/dL — ABNORMAL LOW (ref 13.0–17.0)
MCH: 31.5 pg (ref 26.0–34.0)
MCHC: 32.7 g/dL (ref 30.0–36.0)
MCV: 96.2 fL (ref 78.0–100.0)
Platelets: 109 10*3/uL — ABNORMAL LOW (ref 150–400)
RBC: 3.94 MIL/uL — ABNORMAL LOW (ref 4.22–5.81)
RDW: 16.3 % — ABNORMAL HIGH (ref 11.5–15.5)
WBC: 3.8 10*3/uL — ABNORMAL LOW (ref 4.0–10.5)

## 2014-05-23 LAB — TROPONIN I
Troponin I: 2.25 ng/mL (ref <0.031)
Troponin I: 1.93 ng/mL (ref <0.031)

## 2014-05-23 LAB — HEPATITIS B SURFACE ANTIGEN: HEP B S AG: NEGATIVE

## 2014-05-23 MED ORDER — MIRABEGRON ER 25 MG PO TB24
25.0000 mg | ORAL_TABLET | Freq: Every day | ORAL | Status: DC
  Administered 2014-05-23 – 2014-05-26 (×4): 25 mg via ORAL
  Filled 2014-05-23 (×5): qty 1

## 2014-05-23 MED ORDER — SODIUM CHLORIDE 0.9 % IV SOLN
INTRAVENOUS | Status: AC
  Administered 2014-05-23: 50 mL/h via INTRAVENOUS

## 2014-05-23 NOTE — Progress Notes (Signed)
UR completed 

## 2014-05-23 NOTE — Progress Notes (Signed)
  Echocardiogram 2D Echocardiogram has been performed.  Jason [name], Jason [name] 05/23/2014, 12:42 PM

## 2014-05-23 NOTE — Evaluation (Signed)
Physical Therapy Evaluation Patient Details Name: Jason [name] MRN: [id number] DOB: 03-23-1919 Today's Date: 05/23/2014   History of Present Illness  Pt admit for syncope and collapse.  Possible DVT as well.  Troponins were trending up but are now trending down.   Clinical Impression  Pt admitted with above diagnosis. Pt currently with functional limitations due to the deficits listed below (see PT Problem List). Pt will need NHP at d/c as he is unsteady and cannot walk on his own. Will need vestibular therapy to get back to independent level.  Pt agrreable to NHP for therapy.  Will follow acutely.  Pt will benefit from skilled PT to increase their independence and safety with mobility to allow discharge to the venue listed below.      Follow Up Recommendations SNF;Supervision/Assistance - 24 hour (for vestibular rehab)    Equipment Recommendations  Other (comment) (TBA)    Recommendations for Other Services       Precautions / Restrictions Precautions Precautions: Fall Restrictions Weight Bearing Restrictions: No      Mobility  Bed Mobility Overal bed mobility: Needs Assistance Bed Mobility: Supine to Sit     Supine to sit: Min assist     General bed mobility comments: Needed assist for elevation of trunk.  Has brace on right arm.   Transfers Overall transfer level: Needs assistance Equipment used: 1 person hand held assist Transfers: Sit to/from [employer] Sit to Stand: Mod assist;+2 physical assistance;Min assist Stand pivot transfers: Min assist;+2 safety/equipment       General transfer comment: Pt needed steadying assist for stand pivot from bed to recliner.  Unsteady on feet.    Ambulation/Gait                Stairs            Wheelchair Mobility    Modified Rankin (Stroke Patients Only)       Balance Overall balance assessment: Needs assistance;History of Falls Sitting-balance support: No upper extremity supported;Feet  supported Sitting balance-Leahy Scale: Fair     Standing balance support: Single extremity supported;During functional activity Standing balance-Leahy Scale: Poor Standing balance comment: requires UE support of one extremity and elbow on right  for balance.                               Pertinent Vitals/Pain Pain Assessment: No/denies pain  VSS    Home Living Family/patient expects to be discharged to:: Assisted living [facility])               Home Equipment: Dan Humphreys - 4 wheels;Shower seat;Bedside commode Additional Comments: Pt stated that A living only provides his meals.  He takes care of himself otherwise.    Prior Function Level of Independence: Independent with assistive device(s)               Hand Dominance   Dominant Hand: Right    Extremity/Trunk Assessment   Upper Extremity Assessment: Defer to OT evaluation           Lower Extremity Assessment: Generalized weakness         Communication   Communication: HOH  Cognition Arousal/Alertness: Awake/alert Behavior During Therapy: WFL for tasks assessed/performed Overall Cognitive Status: History of cognitive impairments - at baseline       Memory: Decreased short-term memory              General Comments General comments (skin  integrity, edema, etc.): Pt reported that he had spinning dizziness at times.  Tested pt for vertigo with positive test for left BPPV therefore treated pt with canalith repositioning for left BPPV.  Pt reports that dizziness was a 3/10 after treatment and started at 10/10.  Will reassess next visit and continue treating prn.      Exercises        Assessment/Plan    PT Assessment Patient needs continued PT services  PT Diagnosis Difficulty walking;Generalized weakness (dizziness)   PT Problem List Decreased balance;Decreased activity tolerance;Decreased mobility;Decreased knowledge of use of DME;Decreased safety awareness;Decreased knowledge  of precautions (canalith repositioning maneuver)  PT Treatment Interventions DME instruction;Gait training;Therapeutic activities;Therapeutic exercise;Balance training;Functional mobility training;Patient/family education (canalith repositioning maneuver)   PT Goals (Current goals can be found in the Care Plan section) Acute Rehab PT Goals Patient Stated Goal: to get better PT Goal Formulation: With patient Time For Goal Achievement: 05/30/14 Potential to Achieve Goals: Good    Frequency Min 3X/week   Barriers to discharge Decreased caregiver support      Co-evaluation               End of Session Equipment Utilized During Treatment: Gait belt Activity Tolerance: Patient limited by fatigue Patient left: in chair;with call bell/phone within reach;with chair alarm set Nurse Communication: Mobility status    Functional Assessment Tool Used: clinical judgment Functional Limitation: Mobility: Walking and moving around Mobility: Walking and Moving Around Current Status [phone](G8978): At least 20 percent but less than 40 percent impaired, limited or restricted Mobility: Walking and Moving Around Goal Status [phone](G8979): At least 1 percent but less than 20 percent impaired, limited or restricted    Time: 1045-1106 PT Time Calculation (min) (ACUTE ONLY): 21 min   Charges:   PT Evaluation $Initial PT Evaluation Tier I: 1 Procedure     PT G Codes:   PT G-Codes **NOT FOR INPATIENT CLASS** Functional Assessment Tool Used: clinical judgment Functional Limitation: Mobility: Walking and moving around Mobility: Walking and Moving Around Current Status ([id number](G8978): At least 20 percent but less than 40 percent impaired, limited or restricted Mobility: Walking and Moving Around Goal Status [phone](G8979): At least 1 percent but less than 20 percent impaired, limited or restricted    Jason [name], Jason [name] 05/23/2014, 2:32 PM [facility]Indigo Chaddock,PT Acute Rehabilitation [phone])

## 2014-05-23 NOTE — Progress Notes (Addendum)
PROGRESS NOTE    Jason [name] ZOX:[id number] DOB: December 31, 1919 DOA: 05/22/2014 PCP: Leo GrosserPICKARD,[name], MD  Outpatient Specialists:   Cardiology: Dr. Tonny BollmanMichael Cooper  GI: Dr. Stan Headarl Gessner  Urology: Dr. Emeline GinsStephen Dahlsteadt  HPI/Brief narrative 81 y.o. 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 Patient lives at an assisted living facility and ambulates with the help of a walker. He has chronic vertigo and has unsteady gait with several near falls or falls. His wife is in residential hospice. He recently developed URI symptoms associated with poor oral intake. She sustained a mechanical fall on 3/10 and discharged from the ED after negative workup. Returned on 3/11 after syncope, fall and right frontal scalp laceration. In the ED, CT head negative for acute findings. CT neck pending. Creatinine 1.6, hemoglobin 12.8, platelets 110 and troponin elevated. Cardiology has evaluated. Right frontal scalp laceration sutured by EDP. He has chronic urinary frequency and has to urinate every 2 hours.  Assessment/Plan:   1. Syncope and collapse: DD-orthostatic hypotension, bradycardia (less likely), other arrhythmias, structural heart disease, vasovagal. Admitted to telemetry: SB in 45-50's range - atenolol dose reduced on admission. Orthostatic blood pressures: Negative. Follow-up 2-D echo. Hydrated with IV fluids. PT and OT evaluation. Cardiology follow-up appreciated. VQ scan: Low probability for PE 2. Dehydration: Related to URI and poor oral intake. Briefly hydrate with IV fluids. Improved. 3. Acute on stage III chronic kidney disease: Precipitated by dehydration. Brief IV fluids. Improving. 4. BPH with urinary frequency/overactive bladder: PVR ? 233 mls. Continue Flomax. The patient apparently had in and out  cath by coud catheter last night in ED. Mild intermittent hematuria this morning. Discussed with urologist on call: Options limited. Increasing Flomax could cause orthostatic hypotension. May try Myrbetriq-expensive and insurance is may not cover (daughter wishes to try this). Also recommended placing Foley catheter for bladder rest and outpatient follow-up with urology. Patient probably has overactive bladder. 5. Elevated troponin/likely demand ischemia: No acute EKG changes. No chest pain. Cardiology has consulted. Trend troponin. Check 2-D echo. 6. Acute viral bronchitis: Chest x-ray negative for acute findings. DC levofloxacin and treat supportively. 7. History of recurrent DVT/PE & PAF: Held xarelto (as discussed with cardiology) 3/11 secondary to scalp laceration and suturing. Patient has history of chronic dizziness/vertigo, unsteady gait and frequent falls/near falls. May need to consider if he is safe anymore to continue anticoagulation given this fall risk.? If SNF will reduce fall risk. 8. Right frontal scalp laceration: Status post suturing. No active bleeding. 9. Prolonged QTC: DC'ed levofloxacin. Check magnesium: 1.9. Replace K. Follow on telemetry. QTC 491 10. Thrombocytopenia: Stable 11. PAF: Sinus bradycardia. Reduced atenolol on admission. Anticoagulation discussion as above. 12. Hard of hearing   Code Status: Full Family Communication: Discussed with daughter Ms. Juventino SlovakJayne Pruitt Disposition Plan: To be determined.   Consultants:  Cardiology  Procedures:  In & out Cath  Antibiotics:  None   Subjective: Patient denies complaints. As per nursing, had in and out coud cath in the ED (unclear indication. No documentation regarding urinary retention or amount of urine drained) and has had intermittent mild hematuria. Patient denies abdominal pain.  Objective: Filed Vitals:   05/22/14 1700 05/22/14 2000 05/23/14 0500 05/23/14 0923  BP: 168/71 158/58 181/65 157/52  Pulse:  53 54 53 54  Temp: 97.8 F (  36.6 C) 98.3 F (36.8 C) 97.8 F (36.6 C)   TempSrc: Oral Oral Oral   Resp:      Height:    (1.778 m)   SpO2: 95% 97% 95%     Intake/Output Summary (Last 24 hours) at 05/23/14 1302 Last data filed at 05/23/14 0932  Gross per 24 hour  Intake    243 ml  Output    990 ml  Net   -747 ml   There were no vitals filed for this visit.   Exam:  General exam: Pleasant elderly male lying comfortably in bed. Sutured right frontal scalp laceration without bleeding. Respiratory system: Clear. No increased work of breathing. Cardiovascular system: S1 & S2 heard, RRR. No JVD, murmurs, gallops, clicks or pedal edema. Telemetry: Sinus bradycardia in the mid 40s-50s. No pauses. Gastrointestinal system: Abdomen is nondistended, soft and nontender. Normal bowel sounds heard. Central nervous system: Alert and oriented. No focal neurological deficits. Extremities: Symmetric 5 x 5 power.   Data Reviewed: Basic Metabolic Panel:  Recent Labs Lab 05/22/14 1135 05/22/14 1830 05/23/14 0445  NA 139  --  141  K 4.0  --  3.6  CL 109  --  111  CO2 21  --  22  GLUCOSE 125*  --  85  BUN 26*  --  21  CREATININE 1.60*  --  1.37*  CALCIUM 8.3*  --  8.0*  MG  --  1.9  --    Liver Function Tests:  Recent Labs Lab 05/23/14 0445  AST 38*  ALT 17  ALKPHOS 53  BILITOT 0.7  PROT 6.3  ALBUMIN 2.7*   No results for input(s): LIPASE, AMYLASE in the last 168 hours. No results for input(s): AMMONIA in the last 168 hours. CBC:  Recent Labs Lab 05/22/14 1135 05/23/14 0445  WBC 4.9 3.8*  NEUTROABS 3.0  --   HGB 12.8* 12.4*  HCT 38.5* 37.9*  MCV 97.2 96.2  PLT 110* 109*   Cardiac Enzymes:  Recent Labs Lab 05/22/14 1830 05/22/14 2310 05/23/14 0445  TROPONINI 2.01* 2.25* 1.93*   BNP (last 3 results) No results for input(s): PROBNP in the last 8760 hours. CBG: No results for input(s): GLUCAP in the last 168 hours.  No results found for this or any  previous visit (from the past 240 hour(s)).        Studies: Dg Forearm Left  05/22/2014   CLINICAL DATA:  Fall.  Pain mid to distal left forearm.  Abrasion.  EXAM: LEFT FOREARM - 2 VIEW  COMPARISON:  None.  FINDINGS: The bones appear osteopenic. There is a subchondral cyst noted within the radial styloid. Patient's IV catheter overlies the ulnar styloid and precludes accurate assessment of this area. No displaced fractures identified.  IMPRESSION: 1. IV catheter overlies the radial styloid in precludes accurate assessment of this area. 2. Remaining portions of the forearm appear intact.   Electronically Signed   By: Signa Kell M.D.   On: 05/22/2014 13:25   Ct Head Wo Contrast  05/22/2014   CLINICAL DATA:  Syncopal episode  EXAM: CT HEAD WITHOUT CONTRAST  TECHNIQUE: Contiguous axial images were obtained from the base of the skull through the vertex without intravenous contrast.  COMPARISON:  05/21/2014  FINDINGS: New scalp laceration is noted in the right frontal region. No underlying bony abnormality is noted. Atrophic and ischemic changes are again identified stable from the previous day. No findings to suggest acute hemorrhage, acute infarction or space-occupying mass lesion  are noted.  IMPRESSION: Chronic atrophic and ischemic changes are seen.  New right frontal scalp laceration   Electronically Signed   By: Alcide Clever M.D.   On: 05/22/2014 13:30   Ct Cervical Spine Wo Contrast  05/22/2014   CLINICAL DATA:  Fall.  Forehead laceration.  EXAM: CT CERVICAL SPINE WITHOUT CONTRAST  TECHNIQUE: Multidetector CT imaging of the cervical spine was performed without intravenous contrast. Multiplanar CT image reconstructions were also generated.  COMPARISON:  03/31/2009  FINDINGS: Prominent degenerative spurring at the C1- 2 articulation particularly on the left. Spurring might impinge upon the left C2 nerve.  Left osseous foraminal stenosis suspected at C3-4 due to uncinate and facet spurring. There is  potentially mild osseous foraminal stenosis at C5-6 as well on the left side, and there is osseous foraminal stenosis on the right at C3-4 again due to spurring. Posterior osseous ridging at C5-6. Biapical pleural parenchymal scarring.  No cervical spine fracture or acute subluxation.  IMPRESSION: 1. No acute cervical spine findings. Multilevel spurring causes foraminal impingement.   Electronically Signed   By: Gaylyn Rong M.D.   On: 05/22/2014 16:42   Nm Pulmonary Perf And Vent  05/22/2014   CLINICAL DATA:  History of pulmonary embolus.  On blood thinner.  EXAM: NUCLEAR MEDICINE VENTILATION - PERFUSION LUNG SCAN  TECHNIQUE: Ventilation images were obtained in multiple projections using inhaled aerosol technetium 99 M DTPA. Perfusion images were obtained in multiple projections after intravenous injection of Tc-68m MAA.  RADIOPHARMACEUTICALS:  Six mCi Tc-80m DTPA aerosol and 40 mCi Tc-14m MAA  COMPARISON:  CXR 05/22/2014  FINDINGS: Ventilation: No focal ventilation defect.  Perfusion: No wedge shaped peripheral perfusion defects to suggest acute pulmonary embolism.  IMPRESSION: Very low probability for pulmonary embolism.   Electronically Signed   By: Marlan Palau M.D.   On: 05/22/2014 15:26   Dg Chest Portable 1 View  05/22/2014   CLINICAL DATA:  Loss of consciousness.  Cough  EXAM: PORTABLE CHEST - 1 VIEW  COMPARISON:  05/21/2014  FINDINGS: Heart size is normal. There is no pleural effusion or edema. Lung volumes are low. No airspace consolidation identified.  IMPRESSION: 1. No acute cardiopulmonary abnormalities. 2. Low lung volumes.   Electronically Signed   By: Signa Kell M.D.   On: 05/22/2014 12:13        Scheduled Meds: . acetaminophen  1,000 mg Oral BID  . atenolol  25 mg Oral Daily  . docusate sodium  100 mg Oral QHS  . DULoxetine  30 mg Oral Daily  . guaiFENesin  1,200 mg Oral BID  . latanoprost  1 drop Both Eyes QHS  . pantoprazole  40 mg Oral Daily  . rivaroxaban  15 mg  Oral Q supper  . sodium chloride  3 mL Intravenous Q12H  . tamsulosin  0.4 mg Oral Daily   Continuous Infusions:   Principal Problem:   Syncope and collapse Active Problems:   HYPERTENSION, BENIGN   VERTIGO   Thrombocytopenia   PAF (paroxysmal atrial fibrillation)   Breast pain   Scalp laceration   Acute renal failure superimposed on stage 3 chronic kidney disease   Acute bronchitis    Time spent: 40 minutes.    Marcellus Scott, MD, FACP, FHM. [facility] Pager [phone]  If 7PM-7AM, please contact night-coverage [email or web address] Password [email or web address] 05/23/2014, 1:02 PM

## 2014-05-23 NOTE — Progress Notes (Signed)
Subjective:  Complains of headache from his fall.  No shortness of breath or chest pain.  Objective:  Vital Signs in the last 24 hours: BP 157/52 mmHg  Pulse 54  Temp(Src) 97.8 F (36.6 C) (Oral)  Resp 28  Ht 5\' 10"  (1.778 m)  SpO2 95% Orthostatic VS for the past 24 hrs:  BP- Lying Pulse- Lying BP- Sitting Pulse- Sitting BP- Standing at 0 minutes Pulse- Standing at 0 minutes  05/22/14 2000 158/58 mmHg 54 161/67 mmHg 62 142/72 mmHg 70     Physical Exam: Pleasant elderly male in no acute distress Lungs:  Clear Cardiac:  Regular rhythm, normal S1 and S2, no S3 Abdomen:  Soft, nontender, no masses Extremities: Right arm bandaged, laceration on head noted.    Intake/Output from previous day: 03/11 0701 - 03/12 0700 In: -  Out: 690 [Urine:690]  Lab Results: Basic Metabolic Panel:  Recent Labs  16/12/9601/11/16 1135 05/23/14 0445  NA 139 141  K 4.0 3.6  CL 109 111  CO2 21 22  GLUCOSE 125* 85  BUN 26* 21  CREATININE 1.60* 1.37*   CBC:  Recent Labs  05/22/14 1135 05/23/14 0445  WBC 4.9 3.8*  NEUTROABS 3.0  --   HGB 12.8* 12.4*  HCT 38.5* 37.9*  MCV 97.2 96.2  PLT 110* 109*   Cardiac Enzymes: Troponin (Point of Care Test)  Recent Labs  05/22/14 1153  TROPIPOC 1.65*   Cardiac Panel (last 3 results)  Recent Labs  05/22/14 1830 05/22/14 2310 05/23/14 0445  TROPONINI 2.01* 2.25* 1.93*    Telemetry: Sinus bradycardia, no severe bradycardia or tachycardia noted  Assessment/Plan:  1.  Syncope with injury-the circumstances favor a noncardiac etiology or orthostasis-no definite bradycardia noted 2.  Coronary artery disease with previous PCI 3.  Paroxysmal atrial fibrillation 4.  Hypercoagulable state 5.  History of TIA 6.  Elevation of troponin of uncertain cause   Recommendations:  No orthostatic changes at this point in time.  No further workup planned of the abnormal troponin.  Will need physical therapy evaluation and continue to check  orthostatics.       Darden PalmerW. Spencer Willet Schleifer, Jr.  MD [facility]  05/23/2014, 10:39 AM

## 2014-05-23 NOTE — Care Management Note (Signed)
Date Additional Medicare IM given:  05/23/14  Additional Medicare IM given by:  Icis Budreau, RN, BSN, CCM   

## 2014-05-23 NOTE — Progress Notes (Signed)
MD notified about critical troponin value of 2.01 tonight and made aware that troponin levels are trending up. Xarelto is being held due to pt's scalp laceration and suturing so no further action recommended. Troponin level continue to trend up slightly at 2.25.

## 2014-05-24 ENCOUNTER — Observation Stay (HOSPITAL_COMMUNITY): Payer: Medicare Other

## 2014-05-24 DIAGNOSIS — N200 Calculus of kidney: Secondary | ICD-10-CM | POA: Diagnosis not present

## 2014-05-24 DIAGNOSIS — N179 Acute kidney failure, unspecified: Secondary | ICD-10-CM | POA: Diagnosis not present

## 2014-05-24 LAB — CBC
HEMATOCRIT: 36.4 % — AB (ref 39.0–52.0)
Hemoglobin: 12.1 g/dL — ABNORMAL LOW (ref 13.0–17.0)
MCH: 31.8 pg (ref 26.0–34.0)
MCHC: 33.2 g/dL (ref 30.0–36.0)
MCV: 95.5 fL (ref 78.0–100.0)
PLATELETS: 112 10*3/uL — AB (ref 150–400)
RBC: 3.81 MIL/uL — AB (ref 4.22–5.81)
RDW: 16.3 % — ABNORMAL HIGH (ref 11.5–15.5)
WBC: 4.4 10*3/uL (ref 4.0–10.5)

## 2014-05-24 LAB — BASIC METABOLIC PANEL
Anion gap: 5 (ref 5–15)
BUN: 17 mg/dL (ref 6–23)
CO2: 23 mmol/L (ref 19–32)
Calcium: 8 mg/dL — ABNORMAL LOW (ref 8.4–10.5)
Chloride: 112 mmol/L (ref 96–112)
Creatinine, Ser: 1.39 mg/dL — ABNORMAL HIGH (ref 0.50–1.35)
GFR calc non Af Amer: 42 mL/min — ABNORMAL LOW (ref >90)
GFR, EST AFRICAN AMERICAN: 49 mL/min — AB (ref >90)
GLUCOSE: 95 mg/dL (ref 70–99)
Potassium: 3.9 mmol/L (ref 3.5–5.1)
SODIUM: 140 mmol/L (ref 135–145)

## 2014-05-24 MED ORDER — AMLODIPINE BESYLATE 5 MG PO TABS
5.0000 mg | ORAL_TABLET | Freq: Every day | ORAL | Status: DC
  Administered 2014-05-24: 5 mg via ORAL
  Filled 2014-05-24: qty 1

## 2014-05-24 MED ORDER — ATENOLOL 25 MG PO TABS
12.5000 mg | ORAL_TABLET | Freq: Every day | ORAL | Status: DC
  Administered 2014-05-24 – 2014-05-25 (×2): 12.5 mg via ORAL
  Filled 2014-05-24: qty 1

## 2014-05-24 MED ORDER — POLYETHYLENE GLYCOL 3350 17 G PO PACK
17.0000 g | PACK | Freq: Every day | ORAL | Status: DC
  Administered 2014-05-24 – 2014-05-26 (×3): 17 g via ORAL
  Filled 2014-05-24 (×2): qty 1

## 2014-05-24 MED ORDER — SODIUM CHLORIDE 0.9 % IV SOLN
INTRAVENOUS | Status: DC
  Administered 2014-05-24: 50 mL/h via INTRAVENOUS

## 2014-05-24 MED ORDER — POLYETHYLENE GLYCOL 3350 17 G PO PACK
PACK | ORAL | Status: AC
  Filled 2014-05-24: qty 1

## 2014-05-24 NOTE — Progress Notes (Signed)
PROGRESS NOTE    Jason [name] ZOX:[id number] DOB: 25-Sep-1919 DOA: 05/22/2014 PCP: Leo GrosserPICKARD,[name], MD  Outpatient Specialists:   Cardiology: Dr. Tonny BollmanMichael Cooper  GI: Dr. Stan Headarl Gessner  Urology: Dr. Emeline GinsStephen Dahlsteadt  HPI/Brief narrative 81 y.o. 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 Patient lives at an assisted living facility and ambulates with the help of a walker. He has chronic vertigo and has unsteady gait with several near falls or falls. His wife is in residential hospice. He recently developed URI symptoms associated with poor oral intake. She sustained a mechanical fall on 3/10 and discharged from the ED after negative workup. Returned on 3/11 after syncope, fall and right frontal scalp laceration. In the ED, CT head negative for acute findings. CT neck pending. Creatinine 1.6, hemoglobin 12.8, platelets 110 and troponin elevated. Cardiology has evaluated. Right frontal scalp laceration sutured by EDP. He has chronic urinary frequency and has to urinate every 2 hours.  Assessment/Plan:   1. Syncope and collapse: DD-orthostatic hypotension, bradycardia (less likely), other arrhythmias, structural heart disease, vasovagal. Admitted to telemetry: atenolol dose reduced on admission due to sinus bradycardia. Orthostatic blood pressures: Intermittently positive 3/12. Follow-up 2-D echo. Hydrated with IV fluids. PT and OT evaluation. Cardiology follow-up appreciated. VQ scan: Low probability for PE. DC Flomax. DC newly started amlodipine. 2. Dehydration: Related to URI and poor oral intake. Briefly hydrate with IV fluids. Improved. 3. Acute on stage III chronic kidney disease: Precipitated by dehydration. Brief IV fluids. Improving. Creatinine may be at baseline. Check Renal US. 4. BPH  with urinary frequency/overactive bladder: PVR ? 233 mls. DC Flomax d/t concerns for Orthostasis. The patient apparently had in and out cath by coud catheter last night in ED. Mild intermittent hematuria - resolved. Discussed with urologist on call 3/12: Options limited. Increasing Flomax could cause orthostatic hypotension. May try Myrbetriq-expensive and insurance is may not cover (daughter wishes to try this). Also recommended placing Foley catheter for bladder rest and outpatient follow-up with urology. Patient probably has overactive bladder. 5. Elevated troponin/likely demand ischemia: No acute EKG changes. No chest pain. Cardiology has consulted. Trend troponin- peak 2.25. 2-D echo: LVEF 50-55%. 6. Acute viral bronchitis: Chest x-ray negative for acute findings. DC levofloxacin and treat supportively. Improved 7. History of recurrent DVT/PE & PAF: Held xarelto (as discussed with cardiology) 3/11 secondary to scalp laceration and suturing. Patient has history of chronic dizziness/vertigo, unsteady gait and frequent falls/near falls. May need to consider if he is safe anymore to continue anticoagulation given this fall risk.? If SNF will reduce fall risk. Resumed Xarelto. 8. Right frontal scalp laceration: Status post suturing. No active bleeding. 9. Prolonged QTC: DC'ed levofloxacin. Check magnesium: 1.9. Replace K. Follow on telemetry. QTC 491 10. Thrombocytopenia: Stable 11. PAF: Sinus bradycardia. Reduced atenolol on admission & again on 3/13 to 12.5 mg daily- d/t persistent SB in mid 40's. Anticoagulation discussion as above. 12. Hard of hearing   Code Status: Full Family Communication: Discussed with daughter Ms. Juventino SlovakJayne Pruitt 3/13 Disposition Plan: To be determined.   Consultants:  Cardiology  Procedures:  In & out Cath  Antibiotics:  None   Subjective: Patient denies complaints. As per nursing- urine straw colored.  Objective: Filed Vitals:   05/23/14 2100 05/24/14 0600  05/24/14 1045 05/24/14 1322  BP: 148/58  188/74 186/64 156/74  Pulse: 49 52 48 52  Temp: 98.3 F (36.8 C) 98.1 F (36.7 C)  98.2 F (36.8 C)  TempSrc:    Oral  Resp: Height:      Weight:  79.833 kg (176 lb)    SpO2: 98% 96%  98%    Intake/Output Summary (Last 24 hours) at 05/24/14 1427 Last data filed at 05/24/14 1321  Gross per 24 hour  Intake    880 ml  Output    850 ml  Net     30 ml   Filed Weights   05/24/14 0600  Weight: 79.833 kg (176 lb)     Exam:  General exam: Pleasant elderly male sitting up comfortably at edge of bed. Sutured right frontal scalp laceration without bleeding. Respiratory system: Clear. No increased work of breathing. Cardiovascular system: S1 & S2 heard, RRR. No JVD, murmurs, gallops, clicks or pedal edema. Telemetry: Sinus bradycardia in the mid 40s-50s. No pauses. Gastrointestinal system: Abdomen is nondistended, soft and nontender. Normal bowel sounds heard. Central nervous system: Alert and oriented. No focal neurological deficits. Extremities: Symmetric 5 x 5 power.   Data Reviewed: Basic Metabolic Panel:  Recent Labs Lab 05/22/14 1135 05/22/14 1830 05/23/14 0445 05/24/14 0430  NA 139  --  141 140  K 4.0  --  3.6 3.9  CL 109  --  111 112  CO2 21  --  22 23  GLUCOSE 125*  --  85 95  BUN 26*  --  21 17  CREATININE 1.60*  --  1.37* 1.39*  CALCIUM 8.3*  --  8.0* 8.0*  MG  --  1.9  --   --    Liver Function Tests:  Recent Labs Lab 05/23/14 0445  AST 38*  ALT 17  ALKPHOS 53  BILITOT 0.7  PROT 6.3  ALBUMIN 2.7*   No results for input(s): LIPASE, AMYLASE in the last 168 hours. No results for input(s): AMMONIA in the last 168 hours. CBC:  Recent Labs Lab 05/22/14 1135 05/23/14 0445 05/24/14 0430  WBC 4.9 3.8* 4.4  NEUTROABS 3.0  --   --   HGB 12.8* 12.4* 12.1*  HCT 38.5* 37.9* 36.4*  MCV 97.2 96.2 95.5  PLT 110* 109* 112*   Cardiac Enzymes:  Recent Labs Lab 05/22/14 1830 05/22/14 2310  05/23/14 0445  TROPONINI 2.01* 2.25* 1.93*   BNP (last 3 results) No results for input(s): PROBNP in the last 8760 hours. CBG: No results for input(s): GLUCAP in the last 168 hours.  No results found for this or any previous visit (from the past 240 hour(s)).        Studies: Ct Cervical Spine Wo Contrast  05/22/2014   CLINICAL DATA:  Fall.  Forehead laceration.  EXAM: CT CERVICAL SPINE WITHOUT CONTRAST  TECHNIQUE: Multidetector CT imaging of the cervical spine was performed without intravenous contrast. Multiplanar CT image reconstructions were also generated.  COMPARISON:  03/31/2009  FINDINGS: Prominent degenerative spurring at the C1- 2 articulation particularly on the left. Spurring might impinge upon the left C2 nerve.  Left osseous foraminal stenosis suspected at C3-4 due to uncinate and facet spurring. There is potentially mild osseous foraminal stenosis at C5-6 as well on the left side, and there is osseous foraminal stenosis on the right at C3-4 again due to spurring. Posterior osseous ridging at C5-6. Biapical pleural parenchymal scarring.  No cervical spine fracture or acute subluxation.  IMPRESSION: 1. No acute cervical spine  findings. Multilevel spurring causes foraminal impingement.   Electronically Signed   By: Gaylyn Rong M.D.   On: 05/22/2014 16:42   Nm Pulmonary Perf And Vent  05/22/2014   CLINICAL DATA:  History of pulmonary embolus.  On blood thinner.  EXAM: NUCLEAR MEDICINE VENTILATION - PERFUSION LUNG SCAN  TECHNIQUE: Ventilation images were obtained in multiple projections using inhaled aerosol technetium 99 M DTPA. Perfusion images were obtained in multiple projections after intravenous injection of Tc-67m MAA.  RADIOPHARMACEUTICALS:  Six mCi Tc-56m DTPA aerosol and 40 mCi Tc-41m MAA  COMPARISON:  CXR 05/22/2014  FINDINGS: Ventilation: No focal ventilation defect.  Perfusion: No wedge shaped peripheral perfusion defects to suggest acute pulmonary embolism.   IMPRESSION: Very low probability for pulmonary embolism.   Electronically Signed   By: Marlan Palau M.D.   On: 05/22/2014 15:26        Scheduled Meds: . acetaminophen  1,000 mg Oral BID  . amLODipine  5 mg Oral Daily  . atenolol  12.5 mg Oral Daily  . docusate sodium  100 mg Oral QHS  . DULoxetine  30 mg Oral Daily  . guaiFENesin  1,200 mg Oral BID  . latanoprost  1 drop Both Eyes QHS  . mirabegron ER  25 mg Oral Daily  . pantoprazole  40 mg Oral Daily  . rivaroxaban  15 mg Oral Q supper  . sodium chloride  3 mL Intravenous Q12H  . tamsulosin  0.4 mg Oral Daily   Continuous Infusions:   Principal Problem:   Syncope and collapse Active Problems:   HYPERTENSION, BENIGN   VERTIGO   Thrombocytopenia   PAF (paroxysmal atrial fibrillation)   Breast pain   Scalp laceration   Acute renal failure superimposed on stage 3 chronic kidney disease   Acute bronchitis    Time spent: 40 minutes.    Marcellus Scott, MD, FACP, FHM. [facility] Pager [phone]  If 7PM-7AM, please contact night-coverage [email or web address] Password [email or web address] 05/24/2014, 2:27 PM

## 2014-05-24 NOTE — Progress Notes (Addendum)
Subjective:  Feeling better but still has significant orthostasis.  No shortness of breath or chest pain.  Headache is better.  Objective:  Vital Signs in the last 24 hours: BP 186/64 mmHg  Pulse 48  Temp(Src) 98.1 F (36.7 C) (Oral)  Resp 19  Ht 5\' 10"  (1.778 m)  Wt 79.833 kg (176 lb)  BMI 25.25 kg/m2  SpO2 96% Orthostatic VS for the past 24 hrs:  BP- Lying Pulse- Lying BP- Sitting Pulse- Sitting BP- Standing at 0 minutes Pulse- Standing at 0 minutes  05/24/14 0600 188/74 mmHg 52 (!) 171/112 mmHg 54 (!) 203/95 mmHg 62  05/23/14 2100 148/58 mmHg (!) 49 152/59 mmHg 53 132/61 mmHg 55  05/23/14 1355 125/56 mmHg 50 108/49 mmHg 50 96/45 mmHg 55     Physical Exam: Pleasant elderly male in no acute distress Lungs:  Clear Cardiac:  Regular rhythm, normal S1 and S2, no S3 Abdomen:  Soft, nontender, no masses Extremities: Right arm bandaged, laceration on head noted.    Intake/Output from previous day: 03/12 0701 - 03/13 0700 In: 683 [P.O.:680; I.V.:3] Out: 800 [Urine:800]  Lab Results: Basic Metabolic Panel:  Recent Labs  40/98/1102/02/25 0445 05/24/14 0430  NA 141 140  K 3.6 3.9  CL 111 112  CO2 22 23  GLUCOSE 85 95  BUN 21 17  CREATININE 1.37* 1.39*   CBC:  Recent Labs  05/22/14 1135 05/23/14 0445 05/24/14 0430  WBC 4.9 3.8* 4.4  NEUTROABS 3.0  --   --   HGB 12.8* 12.4* 12.1*  HCT 38.5* 37.9* 36.4*  MCV 97.2 96.2 95.5  PLT 110* 109* 112*   Cardiac Enzymes: Troponin (Point of Care Test)  Recent Labs  05/22/14 1153  TROPIPOC 1.65*   Cardiac Panel (last 3 results)  Recent Labs  05/22/14 1830 05/22/14 2310 05/23/14 0445  TROPONINI 2.01* 2.25* 1.93*    Telemetry: Sinus bradycardia, no severe bradycardia or tachycardia noted  Assessment/Plan:  1.  Syncope with injury-the circumstances favor a noncardiac etiology or orthostasis-no definite bradycardia noted 2.  Coronary artery disease with previous PCI 3.  Paroxysmal atrial fibrillation 4.   Hypercoagulable state 5.  History of TIA 6.  Elevation of troponin of uncertain cause 7.  Significant orthostatic hypotension   Recommendations:  It might be worthwhile stopping the Flomax because of the orthostasis.  Also will help to fluid load him first thing in the morning with oral hydration, sleep of the head of the bed elevated, and wear venous support stockings.       Darden PalmerW. Spencer Ebelin Dillehay, Jr.  MD [facility] Cardiology  05/24/2014, 10:52 AM

## 2014-05-24 NOTE — Progress Notes (Signed)
Utilization review completed.  P.J. Mathan Darroch,RN,BSN Case Manager 

## 2014-05-25 DIAGNOSIS — R296 Repeated falls: Secondary | ICD-10-CM | POA: Diagnosis present

## 2014-05-25 DIAGNOSIS — G629 Polyneuropathy, unspecified: Secondary | ICD-10-CM | POA: Diagnosis present

## 2014-05-25 DIAGNOSIS — I10 Essential (primary) hypertension: Secondary | ICD-10-CM

## 2014-05-25 DIAGNOSIS — I951 Orthostatic hypotension: Secondary | ICD-10-CM | POA: Diagnosis present

## 2014-05-25 DIAGNOSIS — I129 Hypertensive chronic kidney disease with stage 1 through stage 4 chronic kidney disease, or unspecified chronic kidney disease: Secondary | ICD-10-CM | POA: Diagnosis present

## 2014-05-25 DIAGNOSIS — N179 Acute kidney failure, unspecified: Secondary | ICD-10-CM

## 2014-05-25 DIAGNOSIS — I4510 Unspecified right bundle-branch block: Secondary | ICD-10-CM | POA: Diagnosis present

## 2014-05-25 DIAGNOSIS — E860 Dehydration: Secondary | ICD-10-CM | POA: Diagnosis present

## 2014-05-25 DIAGNOSIS — D696 Thrombocytopenia, unspecified: Secondary | ICD-10-CM | POA: Diagnosis not present

## 2014-05-25 DIAGNOSIS — F419 Anxiety disorder, unspecified: Secondary | ICD-10-CM | POA: Diagnosis present

## 2014-05-25 DIAGNOSIS — N183 Chronic kidney disease, stage 3 (moderate): Secondary | ICD-10-CM | POA: Diagnosis not present

## 2014-05-25 DIAGNOSIS — M79642 Pain in left hand: Secondary | ICD-10-CM | POA: Diagnosis not present

## 2014-05-25 DIAGNOSIS — Z803 Family history of malignant neoplasm of breast: Secondary | ICD-10-CM | POA: Diagnosis not present

## 2014-05-25 DIAGNOSIS — F4310 Post-traumatic stress disorder, unspecified: Secondary | ICD-10-CM | POA: Diagnosis present

## 2014-05-25 DIAGNOSIS — Z8249 Family history of ischemic heart disease and other diseases of the circulatory system: Secondary | ICD-10-CM | POA: Diagnosis not present

## 2014-05-25 DIAGNOSIS — Z955 Presence of coronary angioplasty implant and graft: Secondary | ICD-10-CM | POA: Diagnosis not present

## 2014-05-25 DIAGNOSIS — I2510 Atherosclerotic heart disease of native coronary artery without angina pectoris: Secondary | ICD-10-CM | POA: Diagnosis present

## 2014-05-25 DIAGNOSIS — N644 Mastodynia: Secondary | ICD-10-CM | POA: Diagnosis not present

## 2014-05-25 DIAGNOSIS — R42 Dizziness and giddiness: Secondary | ICD-10-CM | POA: Diagnosis not present

## 2014-05-25 DIAGNOSIS — D6859 Other primary thrombophilia: Secondary | ICD-10-CM | POA: Diagnosis present

## 2014-05-25 DIAGNOSIS — K219 Gastro-esophageal reflux disease without esophagitis: Secondary | ICD-10-CM | POA: Diagnosis present

## 2014-05-25 DIAGNOSIS — I248 Other forms of acute ischemic heart disease: Secondary | ICD-10-CM | POA: Diagnosis present

## 2014-05-25 DIAGNOSIS — N401 Enlarged prostate with lower urinary tract symptoms: Secondary | ICD-10-CM | POA: Diagnosis present

## 2014-05-25 DIAGNOSIS — R0902 Hypoxemia: Secondary | ICD-10-CM | POA: Diagnosis present

## 2014-05-25 DIAGNOSIS — N400 Enlarged prostate without lower urinary tract symptoms: Secondary | ICD-10-CM | POA: Diagnosis not present

## 2014-05-25 DIAGNOSIS — K589 Irritable bowel syndrome without diarrhea: Secondary | ICD-10-CM | POA: Diagnosis present

## 2014-05-25 DIAGNOSIS — J069 Acute upper respiratory infection, unspecified: Secondary | ICD-10-CM | POA: Diagnosis present

## 2014-05-25 DIAGNOSIS — Z86711 Personal history of pulmonary embolism: Secondary | ICD-10-CM | POA: Diagnosis not present

## 2014-05-25 DIAGNOSIS — I480 Paroxysmal atrial fibrillation: Secondary | ICD-10-CM | POA: Diagnosis not present

## 2014-05-25 DIAGNOSIS — W1830XA Fall on same level, unspecified, initial encounter: Secondary | ICD-10-CM | POA: Diagnosis present

## 2014-05-25 DIAGNOSIS — S0101XD Laceration without foreign body of scalp, subsequent encounter: Secondary | ICD-10-CM | POA: Diagnosis not present

## 2014-05-25 DIAGNOSIS — J208 Acute bronchitis due to other specified organisms: Secondary | ICD-10-CM | POA: Diagnosis not present

## 2014-05-25 DIAGNOSIS — R55 Syncope and collapse: Secondary | ICD-10-CM | POA: Diagnosis not present

## 2014-05-25 DIAGNOSIS — Z86718 Personal history of other venous thrombosis and embolism: Secondary | ICD-10-CM | POA: Diagnosis not present

## 2014-05-25 DIAGNOSIS — R319 Hematuria, unspecified: Secondary | ICD-10-CM | POA: Diagnosis present

## 2014-05-25 DIAGNOSIS — T148XXA Other injury of unspecified body region, initial encounter: Secondary | ICD-10-CM | POA: Insufficient documentation

## 2014-05-25 DIAGNOSIS — G8929 Other chronic pain: Secondary | ICD-10-CM | POA: Diagnosis present

## 2014-05-25 DIAGNOSIS — M545 Low back pain: Secondary | ICD-10-CM | POA: Diagnosis present

## 2014-05-25 DIAGNOSIS — R2681 Unsteadiness on feet: Secondary | ICD-10-CM | POA: Diagnosis present

## 2014-05-25 DIAGNOSIS — H9190 Unspecified hearing loss, unspecified ear: Secondary | ICD-10-CM | POA: Diagnosis present

## 2014-05-25 DIAGNOSIS — R350 Frequency of micturition: Secondary | ICD-10-CM | POA: Diagnosis present

## 2014-05-25 DIAGNOSIS — Z8673 Personal history of transient ischemic attack (TIA), and cerebral infarction without residual deficits: Secondary | ICD-10-CM | POA: Diagnosis not present

## 2014-05-25 DIAGNOSIS — S0181XA Laceration without foreign body of other part of head, initial encounter: Secondary | ICD-10-CM | POA: Diagnosis present

## 2014-05-25 DIAGNOSIS — I4891 Unspecified atrial fibrillation: Secondary | ICD-10-CM | POA: Diagnosis not present

## 2014-05-25 DIAGNOSIS — I739 Peripheral vascular disease, unspecified: Secondary | ICD-10-CM | POA: Diagnosis present

## 2014-05-25 DIAGNOSIS — E785 Hyperlipidemia, unspecified: Secondary | ICD-10-CM | POA: Diagnosis present

## 2014-05-25 DIAGNOSIS — Z7901 Long term (current) use of anticoagulants: Secondary | ICD-10-CM | POA: Diagnosis not present

## 2014-05-25 LAB — HEPATITIS C ANTIBODY (REFLEX): HCV AB: NEGATIVE

## 2014-05-25 MED ORDER — FUROSEMIDE 10 MG/ML IJ SOLN: 20.0000 mg | Freq: Once | INTRAMUSCULAR | Status: AC

## 2014-05-25 MED ORDER — HYDRALAZINE HCL 20 MG/ML IJ SOLN
10.0000 mg | Freq: Four times a day (QID) | INTRAMUSCULAR | Status: DC | PRN
  Administered 2014-05-25: 10 mg via INTRAVENOUS
  Filled 2014-05-25: qty 1

## 2014-05-25 MED ORDER — ATENOLOL 25 MG PO TABS
25.0000 mg | ORAL_TABLET | Freq: Every day | ORAL | Status: DC
  Administered 2014-05-26: 25 mg via ORAL
  Filled 2014-05-25: qty 1

## 2014-05-25 MED ORDER — NIFEDIPINE ER 30 MG PO TB24
30.0000 mg | ORAL_TABLET | Freq: Every day | ORAL | Status: DC
  Administered 2014-05-25: 30 mg via ORAL
  Filled 2014-05-25: qty 1

## 2014-05-25 MED ORDER — FUROSEMIDE 10 MG/ML IJ SOLN
INTRAMUSCULAR | Status: AC
  Administered 2014-05-25: 20 mg
  Filled 2014-05-25: qty 2

## 2014-05-25 NOTE — Clinical Social Work Placement (Addendum)
    Clinical Social Work Department CLINICAL SOCIAL WORK PLACEMENT NOTE 05/25/2014  Patient:  Earnstine RegalBROWN,Sabir L  Account Number:  [id number] Admit date:  05/22/2014  Clinical Social Worker:  Sharol HarnessPOONUM Kaylani Fromme, Theresia MajorsLCSWA  Date/time:  05/25/2014 01:57 PM  Clinical Social Work is seeking post-discharge placement for this patient at the following level of care:   SKILLED NURSING   (*CSW will update this form in Epic as items are completed)   05/25/2014  Patient/family provided with [facility] Department of Clinical Social Work's list of facilities offering this level of care within the geographic area requested by the patient (or if unable, by the patient's family).  05/25/2014  Patient/family informed of their freedom to choose among providers that offer the needed level of care, that participate in Medicare, Medicaid or managed care program needed by the patient, have an available bed and are willing to accept the patient.  05/25/2014  Patient/family informed of [facility]' ownership interest in [facility], as well as of the fact that they are under no obligation to receive care at this facility.  PASARR submitted to EDS on Existing PASARR number received on   FL2 transmitted to all facilities in geographic area requested by pt/family on  05/25/2014 FL2 transmitted to all facilities within larger geographic area on   Patient informed that his/her managed care company has contracts with or will negotiate with  certain facilities, including the following:     Patient/family informed of bed offers received:  05/25/2014 [facility] Physician recommends and patient chooses bed at    Patient to be transferred to [facility] on  05/26/2014 Patient to be transferred to facility by [facility] Patient and family notified of transfer on  05/26/2014 Name of family member notified:  Juventino SlovakJayne Pruitt (daughter)  The following physician request were entered  in Epic: Physician Request  Please sign FL2.    Additional CommentsSharol Harness:    Elliet Goodnow, LCSWA [phone]

## 2014-05-25 NOTE — Progress Notes (Signed)
Physical Therapy Treatment Patient Details Name: Jason [name] MRN: [id number] DOB: 21-Jan-1920 Today's Date: 05/25/2014    History of Present Illness Pt admit for syncope and collapse.  Possible DVT as well.  Troponins were trending up but are now trending down. Pt with chronic vertigo.    PT Comments    Pt admitted with above diagnosis. Pt currently with functional limitations due to balance and endurance deficits. Pt daughter called this PT early this am and was inquiring about d/c plan.  Daughter was asking if pt could go back to A living and be ok there instead of NH.  This PT explained how treatment went on Sat and that NH was best option at the time but would reassess pt this am.  Upon reassessment, pt slightly better but is still needing 2 person assist for safety and is still very unbalanced at times.  Called daughter after treatment and updated her that pt will definitely need NH to continue to progress vestibular therapy and that he really needs the 5xweek therapy a skilled facility can provide as A living would probably only provide 2-3 xweek.  Daughter understands and wants to be sure she is notified as there are some facilities she prefers.  Will contact SW to let them know to please call daughter and update as to d/c plans.  Pt will benefit from skilled PT to increase their independence and safety with mobility to allow discharge to the venue listed below.    Follow Up Recommendations  SNF;Supervision/Assistance - 24 hour (for vestibular rehab)     Equipment Recommendations  Other (comment) (TBA)    Recommendations for Other Services       Precautions / Restrictions Precautions Precautions: Fall Restrictions Weight Bearing Restrictions: No    Mobility  Bed Mobility Overal bed mobility: Needs Assistance Bed Mobility: Supine to Sit     Supine to sit: Min assist     General bed mobility comments: Needed assist for elevation of trunk.  Has brace on right arm.    Transfers Overall transfer level: Needs assistance Equipment used: 1 person hand held assist Transfers: Sit to/from [employer] Sit to Stand: Mod assist;+2 physical assistance;Min assist Stand pivot transfers: Min assist;+2 safety/equipment       General transfer comment: Pt needed steadying assist for stand pivot from bed to recliner.  Unsteady on feet.  Pt assisted to 3N1 first and once finished, needed total assist to clean bottom but only min assist to steady in static stance.   Ambulation/Gait Ambulation/Gait assistance: Min assist;Mod assist;+2 safety/equipment Ambulation Distance (Feet): 80 Feet (50 feet and then 30 feet) Assistive device: 1 person hand held assist Gait Pattern/deviations: Step-through pattern;Decreased stride length;Staggering left;Staggering right;Shuffle   Gait velocity interpretation: Below normal speed for age/gender General Gait Details: Pt ambulated with shortened step length bil with unsteady gait overall.  Pt with several LOB needing steadying assist.  Pt very shaky with left hand shaky while ambulating.  Pt had to have one sitting rest break.  Fatigues quickly with some DOE 3/4.     Stairs            Wheelchair Mobility    Modified Rankin (Stroke Patients Only)       Balance Overall balance assessment: Needs assistance;History of Falls Sitting-balance support: No upper extremity supported;Feet supported Sitting balance-Leahy Scale: Fair     Standing balance support: Single extremity supported;During functional activity Standing balance-Leahy Scale: Poor Standing balance comment: Requires UE support of one extremity and  elbow on right for balance.                      Cognition Arousal/Alertness: Awake/alert Behavior During Therapy: WFL for tasks assessed/performed Overall Cognitive Status: History of cognitive impairments - at baseline       Memory: Decreased short-term memory               Exercises      General Comments General comments (skin integrity, edema, etc.): Tested pt for BPPV again and felt that pt possibly has left horizontal canal BPPV but pt states he cannot perform BBQ roll as he cannot get on his stomach.  Checked pt again for left posterior canal with positive test therefore repeated canalith repositioning maneuver.  Pt reports he is still dizzy and really can't say if it has gotten better but that it is not worse.        Pertinent Vitals/Pain Pain Assessment: No/denies pain  VSS    Home Living                      Prior Function            PT Goals (current goals can now be found in the care plan section) Progress towards PT goals: Progressing toward goals    Frequency  Min 3X/week    PT Plan Current plan remains appropriate    Co-evaluation             End of Session Equipment Utilized During Treatment: Gait belt Activity Tolerance: Patient limited by fatigue Patient left: in chair;with call bell/phone within reach;with chair alarm set     Time: 1057-1131 PT Time Calculation (min) (ACUTE ONLY): 34 min  Charges:  $Gait Training: 8-22 mins $Self Care/Home Management: 8-22                    G CodesBerline [name]:      Jason [name] F 05/25/2014, 1:08 PM  Jason [name],PT Acute Rehabilitation [phone] (pager)

## 2014-05-25 NOTE — Clinical Social Work Psychosocial (Signed)
     Clinical Social Work Department BRIEF PSYCHOSOCIAL ASSESSMENT 05/25/2014  Patient:  Earnstine RegalBROWN,Isai L     Account Number:  [id number]     Admit date:  05/22/2014  Clinical Social Worker:  Harless NakayamaAMBELAL,Eain Mullendore, LCSWA  Date/Time:  05/25/2014 12:34 PM  Referred by:  Physician  Date Referred:  05/25/2014 Referred for  SNF Placement   Other Referral:   Interview type:  Patient Other interview type:   Spoke with pt at bedside and later pt daughter over the phone    PSYCHOSOCIAL DATA Living Status:  FACILITY Admitted from facility:  Other Level of care:  Assisted Living Primary support name:  Juventino SlovakJayne Pruitt Primary support relationship to patient:  CHILD, ADULT Degree of support available:   Pt has strong support from family. [facility]    CURRENT CONCERNS Current Concerns  Post-Acute Placement   Other Concerns:    SOCIAL WORK ASSESSMENT / PLAN CSW visited pt room to discuss ST rehab at SNF. Pt seated in recliner in good spirits during CSW visit. Pt informed CSW he was [facility]. Pt reported PT/MD explained need for rehab and he in agreement that this will be best for him. Pt informed CSW his [facility] is [facility] and he does not want to be at the same facility but would like to be somewhere close to her. CSW explained SNF referral process and that pt requires specialty therapy and CSW will need to see which facilities are able to provide. Pt understanding and agreeable to referral being sent to all [facility] SNFs. Pt also gave permission for CSW to speak with his daughter [name]. CSW made aware by PT that [name] very involved in decision making.  CSW spoke with [place] over the phone. She confirmed above information. However, she informed CSW that there may not be anywhere close to [facility] that she would prefer for pt to go. She explained that they do not want pt to dc to [facility] because they have tried this in the  past and pt spends more time with his wife than participating in rehab. Pt daughter feels this will not be the best option for pt. She expressed preference in [facility], [facility], or [facility]. CSW agreed to call facilities and notify of referral. CSW made both pt and pt daughter aware of potential plan for dc tomorrow.   Assessment/plan status:  Psychosocial Support/Ongoing Assessment of Needs Other assessment/ plan:   Information/referral to community resources:   SNF list to be provided with bed offers    PATIENTS/FAMILYS RESPONSE TO PLAN OF CARE: Pt and pt daughter very pleasant and cooperative. They are both agreeable to ST rehab before pt [facility].      Hopelynn Gartland, LCSWA  [phone]

## 2014-05-25 NOTE — Progress Notes (Signed)
PROGRESS NOTE    MCDANIEL OHMS BJY:[id number] DOB: 08-11-1919 DOA: 05/22/2014 PCP: Leo Grosser, MD  Outpatient Specialists:   Cardiology: Dr. Tonny Bollman  GI: Dr. Stan Head  Urology: Dr. Emeline Gins  HPI/Brief narrative 81 y.o. 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 Patient lives at an assisted living facility and ambulates with the help of a walker. He has chronic vertigo and has unsteady gait with several near falls or falls. His wife is in residential hospice. He recently developed URI symptoms associated with poor oral intake. She sustained a mechanical fall on 3/10 and discharged from the ED after negative workup. Returned on 3/11 after syncope, fall and right frontal scalp laceration. In the ED, CT head negative for acute findings. CT neck pending. Creatinine 1.6, hemoglobin 12.8, platelets 110 and troponin elevated. Cardiology has evaluated. Right frontal scalp laceration sutured by EDP. He has chronic urinary frequency and has to urinate every 2 hours.  Assessment/Plan:   1. Syncope and collapse: DD-orthostatic hypotension, bradycardia (less likely), other arrhythmias, structural heart disease, vasovagal. Admitted to telemetry: atenolol dose reduced since admission to 12.5 MG daily, due to sinus bradycardia. Orthostatic blood pressures: Intermittently positive 3/12. 2-D echo: LVEF 50-55 percent. VQ scan: Low probability for PE. DC Flomax. Had started amlodipine and nifedipine XL but DC'd due to concern for orthostasis-as discussed with cardiology 3/14 2. Dehydration: Related to URI and poor oral intake. Resolved after brief IV fluids. 3. Acute on stage III chronic kidney disease: Precipitated by dehydration. Acute renal failure resolved. Renal ultrasound: Medical  renal disease. 4. BPH with urinary frequency/overactive bladder: PVR ? 233 mls. DC Flomax d/t concerns for Orthostasis. The patient apparently had in and out cath by coud catheter last night in ED. Mild intermittent hematuria - resolved. Discussed with urologist on call 3/12: Options limited. Increasing Flomax could cause orthostatic hypotension. May try Myrbetriq-expensive and insurance is may not cover (daughter wishes to try this). Also recommended placing Foley catheter for bladder rest and outpatient follow-up with urology. Patient probably has overactive bladder. No significant change. 5. Elevated troponin/likely demand ischemia: No acute EKG changes. No chest pain. Cardiology has consulted. Trend troponin- peak 2.25. 2-D echo: LVEF 50-55%. 6. Acute viral bronchitis: Chest x-ray negative for acute findings. DC levofloxacin and treat supportively. Improved 7. History of recurrent DVT/PE & PAF: Held xarelto (as discussed with cardiology) 3/11 secondary to scalp laceration and suturing. Patient has history of chronic dizziness/vertigo, unsteady gait and frequent falls/near falls. May need to consider if he is safe anymore to continue anticoagulation given this fall risk.? If SNF will reduce fall risk. Resumed Xarelto. Patient and family seem to be pursuing SNF which may help reduce fall risk. 8. Right frontal scalp laceration: Status post suturing. No active bleeding. 9. Prolonged QTC: DC'ed levofloxacin. Check magnesium: 1.9. Replace K. Follow on telemetry. QTC 491 10. Thrombocytopenia: Stable 11. PAF: Sinus bradycardia. Reduced atenolol on admission & again on 3/13 to 12.5 mg daily- d/t persistent SB in mid 40's. Anticoagulation discussion as above. As discussed with cardiology on 3/14, change atenolol to 25 MG daily to avoid A. fib with RVR which can precipitate diastolic dysfunction. May have to except some bradycardia and elevated BP as long as asymptomatic. 12. Hard of hearing   Code Status:  Full Family Communication:  Discussed with daughter Ms. Juventino SlovakJayne Pruitt 3/13 Disposition Plan: Possible DC to SNF 3/15   Consultants:  Cardiology  Procedures:  In & out Cath  Antibiotics:  None   Subjective: Patient complained of feeling unwell this morning but no specific complaints. Stated some dizziness. Denied dyspnea when this M.D. saw patient. Subsequently complained of dyspnea to the cardiology service and received a dose of Lasix.  Objective: Filed Vitals:   05/25/14 0646 05/25/14 0715 05/25/14 0716 05/25/14 0900  BP: 207/86 210/82 198/86 160/60  Pulse:      Temp:      TempSrc:      Resp:      Height:      Weight:      SpO2:       temperature 97.68F, pulse 55/m, respirations 17 per minute and oxygen saturation 96%.  Intake/Output Summary (Last 24 hours) at 05/25/14 1528 Last data filed at 05/25/14 1300  Gross per 24 hour  Intake 1753.33 ml  Output   1525 ml  Net 228.33 ml   Filed Weights   05/24/14 0600  Weight: 79.833 kg (176 lb)     Exam:  General exam: Pleasant elderly male sitting up comfortably on reclining chair. Sutured right frontal scalp laceration without bleeding. Respiratory system: Diminished breath sounds in the bases but otherwise clear to auscultation. No increased work of breathing. Cardiovascular system: S1 & S2 heard, RRR. No JVD, murmurs, gallops, clicks or pedal edema. Telemetry: Sinus bradycardia in the 50s-better. Gastrointestinal system: Abdomen is nondistended, soft and nontender. Normal bowel sounds heard. Central nervous system: Alert and oriented. No focal neurological deficits. Extremities: Symmetric 5 x 5 power.   Data Reviewed: Basic Metabolic Panel:  Recent Labs Lab 05/22/14 1135 05/22/14 1830 05/23/14 0445 05/24/14 0430  NA 139  --  141 140  K 4.0  --  3.6 3.9  CL 109  --  111 112  CO2 21  --  22 23  GLUCOSE 125*  --  85 95  BUN 26*  --  21 17  CREATININE 1.60*  --  1.37* 1.39*  CALCIUM 8.3*  --  8.0* 8.0*    MG  --  1.9  --   --    Liver Function Tests:  Recent Labs Lab 05/23/14 0445  AST 38*  ALT 17  ALKPHOS 53  BILITOT 0.7  PROT 6.3  ALBUMIN 2.7*   No results for input(s): LIPASE, AMYLASE in the last 168 hours. No results for input(s): AMMONIA in the last 168 hours. CBC:  Recent Labs Lab 05/22/14 1135 05/23/14 0445 05/24/14 0430  WBC 4.9 3.8* 4.4  NEUTROABS 3.0  --   --   HGB 12.8* 12.4* 12.1*  HCT 38.5* 37.9* 36.4*  MCV 97.2 96.2 95.5  PLT 110* 109* 112*   Cardiac Enzymes:  Recent Labs Lab 05/22/14 1830 05/22/14 2310 05/23/14 0445  TROPONINI 2.01* 2.25* 1.93*   BNP (last 3 results) No results for input(s): PROBNP in the last 8760 hours. CBG: No results for input(s): GLUCAP in the last 168 hours.  No results found for this or any previous visit (from the past 240 hour(s)).        Studies: [place] Renal  05/24/2014   CLINICAL DATA:  Acute kidney injury.  EXAM: RENAL/URINARY TRACT ULTRASOUND COMPLETE  COMPARISON:  03/31/2014  FINDINGS: Right Kidney:  Length: 11.4 cm.  Echogenic kidney with cortical thinning.  Left Kidney:  Length: 9.7 cm.  Echogenic kidney with cortical thinning.  Bladder:  Appears normal  for degree of bladder distention. Prostate gland measures 3.7 by 2.6 by 5.1 cm.  IMPRESSION: 1. The tiny punctate right renal calculi seen on prior CT from 03/31/2014 are not well observed, although these are in the 1-2 mm range in which sonography is not considered highly sensitive. 2. Echogenic renal cortex suggesting underlying chronic medical renal disease.   Electronically Signed   By: Gaylyn Rong M.D.   On: 05/24/2014 17:23        Scheduled Meds: . acetaminophen  1,000 mg Oral BID  . [START ON 05/26/2014] atenolol  25 mg Oral Daily  . docusate sodium  100 mg Oral QHS  . DULoxetine  30 mg Oral Daily  . guaiFENesin  1,200 mg Oral BID  . latanoprost  1 drop Both Eyes QHS  . mirabegron ER  25 mg Oral Daily  . pantoprazole  40 mg Oral Daily  .  polyethylene glycol  17 g Oral Daily  . rivaroxaban  15 mg Oral Q supper  . sodium chloride  3 mL Intravenous Q12H   Continuous Infusions:   Principal Problem:   Syncope and collapse Active Problems:   HYPERTENSION, BENIGN   VERTIGO   Thrombocytopenia   PAF (paroxysmal atrial fibrillation)   Breast pain   Scalp laceration   Acute renal failure superimposed on stage 3 chronic kidney disease   Acute bronchitis   Contusion    Time spent: 25 minutes.    Marcellus Scott, MD, FACP, FHM. [facility] Pager [phone]  If 7PM-7AM, please contact night-coverage [email or web address] Password [facility] 05/25/2014, 3:28 PM

## 2014-05-25 NOTE — Progress Notes (Signed)
ANTICOAGULATION CONSULT NOTE - Follow Up Consult  Pharmacy Consult for Xarelto Indication: h/o DVT/PE, afib  Allergies  Allergen Reactions  . Ativan [Lorazepam]   . Augmentin [Amoxicillin-Pot Clavulanate] Nausea And Vomiting    daughter states he can tolerate Amoxicillin ok  . Fludrocortisone Acetate Other (See Comments)    unknown  . Scopace [Scopolamine] Other (See Comments)    *patch Unknown reaction    Patient Measurements: Height: 5\' 10"  (177.8 cm) Weight: 176 lb (79.833 kg) IBW/kg (Calculated) : 73 Heparin Dosing Weight:    Vital Signs: Temp: 97.7 F (36.5 C) (03/14 0529) Temp Source: Oral (03/14 0529) BP: 160/60 mmHg (03/14 0900) Pulse Rate: 55 (03/14 0529)  Labs:  Recent Labs  05/22/14 1135 05/22/14 1830 05/22/14 2310 05/23/14 0445 05/24/14 0430  HGB 12.8*  --   --  12.4* 12.1*  HCT 38.5*  --   --  37.9* 36.4*  PLT 110*  --   --  109* 112*  CREATININE 1.60*  --   --  1.37* 1.39*  TROPONINI  --  2.01* 2.25* 1.93*  --     Estimated Creatinine Clearance: 35 mL/min (by C-G formula based on Cr of 1.39).   Assessment: Syncope and collapse from assisted living. S/p scalp laceration and suturing. Falls likely due to orthostasis, vs recent URI with poor oral intake  AC >> Patient on Xarelto per Rx for recurrent PE/DVT, PAF. Xarelto 15 mg po daily with supper started on 05/23/14 (dose reduction recommended by the Beer's guideline in patient who is >/= 79 yo with a CrCl between 30-50 mL/min). VQ scan negative  Cardiovascular: PVD, CAD, HLD, PAF, BP 160/60, HR 55 on atnolol, Nifedipine Endocrinology: Glucose95 Gastrointestinal / Nutrition: GERD on po PPI, Docusate, Miralax Neurology:h/o CVA, anxiety, chronic vertigo, Scheduled Tylenol 1000 BID, Cymbalta Nephrology: Scr 1.39 stable on Myrbetriq for BPH. Rehydrated. Pulmonary: Mucinex Hematology / Oncology: thrombocytopenia PTA Medication Issues: none Best Practices: Xarelto, PPI   Goal of Therapy:   Therapeutic oral anticoagulation Monitor platelets by anticoagulation protocol: Yes   Plan:  Xarelto 15mg  daily  Debrah Granderson S. Merilynn Finlandobertson, PharmD, BCPS Clinical Staff Pharmacist Pager [phone]  Misty Stanleyobertson, Airika Alkhatib Stillinger 05/25/2014,9:44 AM

## 2014-05-25 NOTE — Discharge Instructions (Signed)

## 2014-05-25 NOTE — Progress Notes (Signed)
UR completed 

## 2014-05-25 NOTE — Progress Notes (Signed)
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 Cardiac enzymes + x 3 at 2.01-->2.25--> 1.93.   Subjective: Still with occasional dizziness and unsteady gait. Now with dyspnea and increased work of breathing. O2 sats stable at 97%. Denies CP.   Objective: Vital signs in last 24 hours: Temp:  [97.7 F (36.5 C)-98.2 F (36.8 C)] 97.7 F (36.5 C) (03/14 0529) Pulse Rate:  [48-55] 55 (03/14 0529) Resp:  [15-17] 17 (03/14 0529) BP: (156-215)/(60-106) 160/60 mmHg (03/14 0900) SpO2:  [96 %-98 %] 96 % (03/14 0529) Last BM Date: 05/19/14 (miralax, colace started)  Intake/Output from previous day: 03/13 0701 - 03/14 0700 In: 1913.3 [P.O.:1140; I.V.:773.3] Out: 1375 [Urine:1375] Intake/Output this shift: Total I/O In: 240 [P.O.:240] Out: 200 [Urine:200]  Medications Current Facility-Administered Medications  Medication Dose Route Frequency Provider Last Rate Last Dose  . 0.9 %  sodium chloride infusion   Intravenous Continuous Elease Etienne, MD 50 mL/hr at 05/24/14 1532 50 mL/hr at 05/24/14 1532  . acetaminophen (TYLENOL) tablet 1,000 mg  1,000 mg Oral BID Elease Etienne, MD   1,000 mg at 05/24/14 2222  . albuterol (PROVENTIL) (2.5 MG/3ML) 0.083% nebulizer solution 2.5 mg  2.5 mg Nebulization Q2H PRN Elease Etienne, MD      . ALPRAZolam Prudy Feeler) tablet 0.25 mg  0.25 mg Oral BID PRN Elease Etienne, MD   0.25 mg at 05/22/14 2303  . atenolol (TENORMIN) tablet 12.5 mg  12.5 mg Oral Daily Elease Etienne, MD   12.5 mg at 05/24/14 1045  . benzonatate (TESSALON) capsule 200 mg  200 mg Oral TID PRN Elease Etienne, MD   200 mg at 05/23/14 1631  . docusate sodium (COLACE) capsule 100 mg  100 mg Oral QHS Elease Etienne, MD   100 mg at 05/24/14 2222  . DULoxetine (CYMBALTA) DR capsule 30 mg  30 mg Oral  Daily Elease Etienne, MD   30 mg at 05/24/14 0947  . guaiFENesin (MUCINEX) 12 hr tablet 1,200 mg  1,200 mg Oral BID Elease Etienne, MD   1,200 mg at 05/24/14 2222  . hydrALAZINE (APRESOLINE) injection 10 mg  10 mg Intravenous Q6H PRN Elease Etienne, MD   10 mg at 05/25/14 1610  . HYDROcodone-acetaminophen (NORCO/VICODIN) 5-325 MG per tablet 1 tablet  1 tablet Oral Q6H PRN Elease Etienne, MD   1 tablet at 05/25/14 0711  . latanoprost (XALATAN) 0.005 % ophthalmic solution 1 drop  1 drop Both Eyes QHS Elease Etienne, MD   1 drop at 05/24/14 2222  . meclizine (ANTIVERT) tablet 12.5 mg  12.5 mg Oral TID PRN Elease Etienne, MD      . mirabegron ER [facility]) tablet 25 mg  25 mg Oral Daily Elease Etienne, MD   25 mg at 05/24/14 0948  . NIFEdipine (PROCARDIA-XL/ADALAT CC) 24 hr tablet 30 mg  30 mg Oral Daily Elease Etienne, MD      . nitroGLYCERIN (NITROSTAT) SL tablet 0.4 mg  0.4 mg Sublingual Q5 min PRN Elease Etienne, MD      . ondansetron [facility]) tablet 4 mg  4 mg Oral Q6H PRN Elease Etienne, MD       Or  . ondansetron (ZOFRAN) injection 4 mg  4 mg Intravenous Q6H PRN Maximino Greenland  Hongalgi, MD      . pantoprazole (PROTONIX) EC tablet 40 mg  40 mg Oral Daily Elease Etienne, MD   40 mg at 05/24/14 0948  . polyethylene glycol (MIRALAX / GLYCOLAX) packet 17 g  17 g Oral Daily Elease Etienne, MD   17 g at 05/24/14 1712  . Rivaroxaban (XARELTO) tablet 15 mg  15 mg Oral Q supper Elease Etienne, MD   15 mg at 05/24/14 1712  . sodium chloride 0.9 % injection 3 mL  3 mL Intravenous Q12H Elease Etienne, MD   3 mL at 05/24/14 1045    PE: General appearance: alert, cooperative, no distress and right anterior forhead laceration Neck: mild JVD Lungs: bilateral rales, rhonchi and wheezing Heart: regular rate and rhythm, S1, S2 normal, no murmur, click, rub or gallop Extremities: no LEE Pulses: 2+ and symmetric Skin: warm and dry Neurologic: Grossly normal  Lab Results:   Recent  Labs  05/22/14 1135 05/23/14 0445 05/24/14 0430  WBC 4.9 3.8* 4.4  HGB 12.8* 12.4* 12.1*  HCT 38.5* 37.9* 36.4*  PLT 110* 109* 112*   BMET  Recent Labs  05/22/14 1135 05/23/14 0445 05/24/14 0430  NA 139 141 140  K 4.0 3.6 3.9  CL 109 111 112  CO2 GLUCOSE 125* 85 95  BUN 26* 21 17  CREATININE 1.60* 1.37* 1.39*  CALCIUM 8.3* 8.0* 8.0*   Filed Weights   05/24/14 0600  Weight: 176 lb (79.833 kg)   Cardiac Panel (last 3 results)  Recent Labs  05/22/14 1830 05/22/14 2310 05/23/14 0445  TROPONINI 2.01* 2.25* 1.93*    Studies/Results:  2D echo  05/23/14 Study Conclusions  - Left ventricle: The cavity size was normal. Wall thickness was increased in a pattern of mild LVH. Systolic function was normal. The estimated ejection fraction was in the range of 50% to 55%. Wall motion was normal; there were no regional wall motion abnormalities. - Aortic valve: There was mild regurgitation. - Mitral valve: There was mild regurgitation. - Left atrium: The atrium was moderately dilated. - Pulmonary arteries: Systolic pressure was mildly increased. PA peak pressure: 41 mm Hg (S).  Assessment/Plan  Principal Problem:   Syncope and collapse Active Problems:   HYPERTENSION, BENIGN   VERTIGO   Thrombocytopenia   PAF (paroxysmal atrial fibrillation)   Breast pain   Scalp laceration   Acute renal failure superimposed on stage 3 chronic kidney disease   Acute bronchitis   1. Syncope: no recurrent syncope but still with dizzy spells and unsteady gait. Keep on bedrest. HR currently stable in the 70s-80s resting. 2D echo with normal LVF.   2. Acute Resting Dyspnea: normal LVF on recent echo. Now with increased work of breathing and bibasilar rales and mild wheezing on lung exam. May be due to over hydration with IVFs. O2 sats are stable at 97% RA. Will given dose of 20 mg IV Lasix. Monitor closely. Daily weights. If no improvement after lasix, consider  repeat CXR.   3. HTN: Moderately elevated in the 160s systolic. On atenolol. Will get dose of IV Lasix. Monitor closely.   4. PAF: maintaining NSR/ SB on telemetry. Continue BB therapy and Xarelto.   5. Acute Bronchitis: management per IM  6. A/C KD: SCr 1.39 today. F/u BMP in the am.   7. Bradycardia: HR currently in the 70-80s resting. Telemetry has shown HR in the low 40s. Continue to monitor on telemetry.   8. Elevated  Troponin: Cardiac enzymes + x 3 at 2.01-->2.25--> 1.93. No chest pain. Normal LVF on 2D echo. Felt to be secondary to demand ischemia.       Brittainy M. Sharol HarnessSimmons, PA-C 05/25/2014 10:26 AM

## 2014-05-25 NOTE — Progress Notes (Signed)
OT Cancellation Note  Patient Details Name: Jason [name] L Brocious MRN: [id number] DOB: October 02, 1919   Cancelled Treatment:    Reason Eval/Treat Not Completed: OT screened. Pt's current D/C plan is SNF (spoke with Child psychotherapistsocial worker and pt plans to go to SNF tomorrow). No apparent immediate acute care OT needs, therefore will defer OT to SNF. If OT eval is needed please call Acute Rehab Dept. at [phone] or text page OT at [phone]. Earlie Raveling'   Arryanna Holquin L OTR/L [phone] 05/25/2014, 4:41 PM

## 2014-05-26 DIAGNOSIS — R42 Dizziness and giddiness: Secondary | ICD-10-CM

## 2014-05-26 DIAGNOSIS — N179 Acute kidney failure, unspecified: Secondary | ICD-10-CM | POA: Diagnosis not present

## 2014-05-26 DIAGNOSIS — R339 Retention of urine, unspecified: Secondary | ICD-10-CM | POA: Diagnosis not present

## 2014-05-26 DIAGNOSIS — R3915 Urgency of urination: Secondary | ICD-10-CM | POA: Diagnosis not present

## 2014-05-26 DIAGNOSIS — D696 Thrombocytopenia, unspecified: Secondary | ICD-10-CM | POA: Diagnosis not present

## 2014-05-26 DIAGNOSIS — S0101XD Laceration without foreign body of scalp, subsequent encounter: Secondary | ICD-10-CM | POA: Diagnosis not present

## 2014-05-26 DIAGNOSIS — K5900 Constipation, unspecified: Secondary | ICD-10-CM | POA: Diagnosis not present

## 2014-05-26 DIAGNOSIS — I4891 Unspecified atrial fibrillation: Secondary | ICD-10-CM | POA: Diagnosis not present

## 2014-05-26 DIAGNOSIS — R269 Unspecified abnormalities of gait and mobility: Secondary | ICD-10-CM | POA: Diagnosis not present

## 2014-05-26 DIAGNOSIS — I2510 Atherosclerotic heart disease of native coronary artery without angina pectoris: Secondary | ICD-10-CM | POA: Diagnosis not present

## 2014-05-26 DIAGNOSIS — N400 Enlarged prostate without lower urinary tract symptoms: Secondary | ICD-10-CM | POA: Diagnosis not present

## 2014-05-26 DIAGNOSIS — J208 Acute bronchitis due to other specified organisms: Secondary | ICD-10-CM | POA: Diagnosis not present

## 2014-05-26 DIAGNOSIS — R55 Syncope and collapse: Secondary | ICD-10-CM | POA: Diagnosis not present

## 2014-05-26 DIAGNOSIS — I480 Paroxysmal atrial fibrillation: Secondary | ICD-10-CM | POA: Diagnosis not present

## 2014-05-26 DIAGNOSIS — N644 Mastodynia: Secondary | ICD-10-CM | POA: Diagnosis not present

## 2014-05-26 DIAGNOSIS — I10 Essential (primary) hypertension: Secondary | ICD-10-CM | POA: Diagnosis not present

## 2014-05-26 DIAGNOSIS — N183 Chronic kidney disease, stage 3 (moderate): Secondary | ICD-10-CM | POA: Diagnosis not present

## 2014-05-26 DIAGNOSIS — M79642 Pain in left hand: Secondary | ICD-10-CM | POA: Diagnosis not present

## 2014-05-26 MED ORDER — HYDROCODONE-ACETAMINOPHEN 5-325 MG PO TABS: 1.0000 | ORAL_TABLET | Freq: Four times a day (QID) | ORAL | Status: DC | PRN

## 2014-05-26 MED ORDER — ALPRAZOLAM 0.25 MG PO TABS: 0.2500 mg | ORAL_TABLET | Freq: Two times a day (BID) | ORAL | Status: DC | PRN

## 2014-05-26 MED ORDER — MIRABEGRON ER 25 MG PO TB24: 25.0000 mg | ORAL_TABLET | Freq: Every day | ORAL | Status: DC

## 2014-05-26 MED ORDER — ATENOLOL 50 MG PO TABS
25.0000 mg | ORAL_TABLET | Freq: Every day | ORAL | Status: DC
Start: 2014-05-26 — End: 2014-06-29

## 2014-05-26 NOTE — Care Management Note (Signed)
    Page 1 of 1   05/26/2014     11:44:26 AM CARE MANAGEMENT NOTE 05/26/2014  Patient:  Jason [name],Jason [name]   Account Number:  [id number]  Date Initiated:  05/26/2014  Documentation initiated by:  GRAVES-BIGELOW,Carolena Fairbank  Subjective/Objective Assessment:   Pt admitted for syncope. Patient lives at an ALF and uses a RW.     Action/Plan:   Plan for SNF  @ [facility] once medically  stable for d/c. CSW assisting with disposition needs.   Anticipated DC Date:  05/26/2014   Anticipated DC Plan:  SKILLED NURSING FACILITY  In-house referral  Clinical Social Worker      DC Planning Services  CM consult      Choice offered to / List presented to:             Status of service:  Completed, signed off Medicare Important Message given?  YES (If response is "NO", the following Medicare IM given date fields will be blank) Date Medicare IM given:  05/26/2014 Medicare IM given by:  GRAVES-BIGELOW,Ruffin Lada Date Additional Medicare IM given:   Additional Medicare IM given by:    Discharge Disposition:  SKILLED NURSING FACILITY  Per UR Regulation:  Reviewed for med. necessity/level of care/duration of stay  If discussed at Long Length of Stay Meetings, dates discussed:    Comments:

## 2014-05-26 NOTE — Progress Notes (Signed)
Physical Therapy Treatment Patient Details Name: Jason [name] MRN: [id number] DOB: [birth year] Today's Date: 05/26/2014    History of Present Illness Pt admit for syncope and collapse.  Possible DVT as well.  Troponins were trending up but are now trending down. Pt with chronic vertigo.    PT Comments    Pt admitted with above diagnosis. Pt currently with functional limitations due to balance and endurance deficits. Pt progressing with ambulation daily.  Doing better today and feeling less dizzy than before.  Pt appreciative of PT vestibular therapy.  Pt will benefit from skilled PT to increase their independence and safety with mobility to allow discharge to the venue listed below.    Follow Up Recommendations  SNF;Supervision/Assistance - 24 hour (for vestibular rehab)     Equipment Recommendations  Other (comment) (TBA)    Recommendations for Other Services       Precautions / Restrictions Precautions Precautions: Fall Restrictions Weight Bearing Restrictions: No    Mobility  Bed Mobility Overal bed mobility: Needs Assistance Bed Mobility: Supine to Sit     Supine to sit: Min guard     General bed mobility comments: Able to get himself up with rail.  Transfers Overall transfer level: Needs assistance Equipment used: Rolling walker (2 wheeled) Transfers: Sit to/from Stand Sit to Stand: Min assist         General transfer comment: Needs steadying assist up to RW.    Ambulation/Gait Ambulation/Gait assistance: Min assist Ambulation Distance (Feet): 150 Feet Assistive device: Rolling walker (2 wheeled) Gait Pattern/deviations: Step-through pattern;Decreased stride length;Drifts right/left   Gait velocity interpretation: Below normal speed for age/gender General Gait Details: Pt ambulated much better with RW.  Still needed some assist but less than without the RW.  Cannot accept challenges however was able to ambulate more safely with Rw.  Pt states that his  dizziness down to 2/10 today and he feels like the therapy had really helped him.     Stairs            Wheelchair Mobility    Modified Rankin (Stroke Patients Only)       Balance           Standing balance support: Bilateral upper extremity supported;During functional activity Standing balance-Leahy Scale: Poor Standing balance comment: Requires UE support.               High level balance activites: Direction changes;Turns;Sudden stops High Level Balance Comments: Needs min assist with challenges with RW.      Cognition Arousal/Alertness: Awake/alert Behavior During Therapy: WFL for tasks assessed/performed Overall Cognitive Status: History of cognitive impairments - at baseline       Memory: Decreased short-term memory              Exercises General Exercises - Lower Extremity Ankle Circles/Pumps: AROM;Both;10 reps;Seated Long Arc Quad: AROM;Both;10 reps;Seated    General Comments General comments (skin integrity, edema, etc.): Pt vertigo better therefore did not perform any testing today and concentrated on walking today.       Pertinent Vitals/Pain Pain Assessment: No/denies pain  VSS    Home Living                      Prior Function            PT Goals (current goals can now be found in the care plan section) Progress towards PT goals: Progressing toward goals    Frequency  Min 3X/week  PT Plan Current plan remains appropriate    Co-evaluation             End of Session Equipment Utilized During Treatment: Gait belt Activity Tolerance: Patient limited by fatigue Patient left: in chair;with call bell/phone within reach;with chair alarm set     Time: 1047-1110 PT Time Calculation (min) (ACUTE ONLY): 23 min  Charges:  $Gait Training: 8-22 mins $Therapeutic Exercise: 8-22 mins                    G Codes:      Avyanna Spada, Alvis LemmingsDawn F 05/26/2014, 1:07 PM Entergy CorporationDawn Arhaan Chesnut,PT Acute  Rehabilitation [phone])

## 2014-05-26 NOTE — Discharge Summary (Signed)
Physician Discharge Summary  Jason [name]:[id number] DOB: 08-Apr-1919 DOA: 05/22/2014  PCP: Leo Grosser, MD  Outpatient Specialists:   Cardiology: Dr. Tonny Bollman  GI: Dr. Stan Head  Urology: Dr. Emeline Gins  Admit date: 05/22/2014 Discharge date: 05/26/2014  Time spent: Greater than 30 minutes  Recommendations for Outpatient Follow-up:  1. M.D. at SNF in 3 days with repeat labs (CBC & BMP) 2. Dr. Emeline Gins, Urology: SNF to make appointment to follow-up in one week.  Discharge Diagnoses:  Principal Problem:   Syncope and collapse Active Problems:   HYPERTENSION, BENIGN   VERTIGO   Thrombocytopenia   PAF (paroxysmal atrial fibrillation)   Breast pain   Scalp laceration   Acute renal failure superimposed on stage 3 chronic kidney disease   Acute bronchitis   Contusion   Discharge Condition: Improved & Stable  Diet recommendation: Heart healthy diet.  Filed Weights   05/24/14 0600 05/26/14 0500  Weight: 79.833 kg (176 lb) 79.697 kg (175 lb 11.2 oz)    History of present illness:  81 y.o. 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 Patient lives at an assisted living facility and ambulates with the help of a walker. He has chronic vertigo and has unsteady gait with several near falls or falls. His wife is in residential hospice. He recently developed URI symptoms associated with poor oral intake. She sustained a mechanical fall on 3/10 and discharged from the ED after negative workup. Returned on 3/11 after syncope, fall and right frontal scalp laceration. In the ED, CT head negative for acute findings. CT neck pending. Creatinine 1.6, hemoglobin 12.8, platelets 110 and troponin elevated. Cardiology has evaluated. Right frontal scalp laceration sutured by  EDP. He has chronic urinary frequency and has to urinate every 2 hours.  Hospital Course:     1. Syncope and collapse: Likely multifactorial secondary to orthostatic hypotension as the main factor complicating generalized debility and fraility from advanced age and bradycardia. He was briefly hydrated with IV fluids. Atenolol dose was reduced from 50 mg to 25 mg daily. He may not tolerate too tight control of his blood pressures and should allow for permissive higher blood pressures. 2-D echo: LVEF 50-55 percent. VQ scan: Low probability for PE. DC Flomax.  2. Dehydration: Related to URI and poor oral intake. Resolved after brief IV fluids. 3. Acute on stage III chronic kidney disease: Precipitated by dehydration. Acute renal failure resolved. Renal ultrasound: Medical renal disease. Creatinine probably at baseline. 4. BPH with urinary frequency/overactive bladder: PVR ? 233 mls. DC Flomax d/t concerns for Orthostasis. The patient apparently had in and out cath by coud catheter in ED. Mild intermittent hematuria - resolved. Discussed with urologist on call 3/12: Options limited. Increasing Flomax could cause orthostatic hypotension. May try Myrbetriq-expensive and insurance is may not cover (daughter wishes to try this and hence was started). Also recommended placing Foley catheter for bladder rest and outpatient follow-up with urology. Patient probably has overactive bladder. No significant change. Recommend outpatient follow-up with primary urologist for further recommendations. 5. Elevated troponin/likely demand ischemia: No acute EKG changes. No chest pain. Cardiology has consulted. Trend troponin- peak 2.25. 2-D echo: LVEF 50-55%. No further workup recommended by cardiology. 6. Acute viral bronchitis: Chest x-ray negative for acute findings. DC levofloxacin and treat supportively. Improved  7. History of recurrent DVT/PE & PAF: Held xarelto (as discussed with cardiology) 3/11 secondary to scalp  laceration and suturing. Patient has history of chronic dizziness/vertigo, unsteady gait and frequent falls/near falls. Currently discharging to SNF where he can be closely monitored and may reduce fall risk. 8. Right frontal scalp laceration: Status post suturing. No active bleeding. 9. Prolonged QTC: DC'ed levofloxacin. Check magnesium: 1.9. Replaced K. QTC 491 10. Thrombocytopenia: Stable 11. PAF: Sinus bradycardia. Atenolol reduced from 50 mg to 25 MG daily with heart rates holding steady in the 50s. 12. Hard of hearing  Consultations:  Cardiology  Procedures:  Scalp laceration sutured by EDP.   Discharge Exam:  Complaints:  Patient states that he feels much better. Ambulating with PT. Denies dizziness or lightheadedness. As per PT, had some maneuvers for vertigo yesterday and today. Denies dyspnea or chest pain. Urinary frequency persists.  Filed Vitals:   05/25/14 0900 05/25/14 1700 05/25/14 2041 05/26/14 0500  BP: 160/60 146/65 150/66 186/82  Pulse:  54 52 59  Temp:  98 F (36.7 C) 97.9 F (36.6 C) 97.7 F (36.5 C)  TempSrc:  Oral    Resp:  16 18 21   Height:      Weight:    79.697 kg (175 lb 11.2 oz)  SpO2:  95% 96% 97%    General exam: Pleasant elderly male ambulating comfortably with PT in the halls. Sutured right frontal scalp laceration without bleeding. Respiratory system: clear to auscultation. No increased work of breathing. Cardiovascular system: S1 & S2 heard, RRR. No JVD, murmurs, gallops, clicks or pedal edema. Telemetry: Sinus bradycardia in the 50s. Gastrointestinal system: Abdomen is nondistended, soft and nontender. Normal bowel sounds heard. Central nervous system: Alert and oriented. No focal neurological deficits. Hard of hearing. Extremities: Symmetric 5 x 5 power.  Discharge Instructions      Discharge Instructions    Call MD for:  difficulty breathing, headache or visual disturbances    Complete by:  As directed      Call MD for:  extreme  fatigue    Complete by:  As directed      Call MD for:  persistant dizziness or light-headedness    Complete by:  As directed      Call MD for:  persistant nausea and vomiting    Complete by:  As directed      Call MD for:  redness, tenderness, or signs of infection (pain, swelling, redness, odor or green/yellow discharge around incision site)    Complete by:  As directed      Call MD for:  severe uncontrolled pain    Complete by:  As directed      Call MD for:  temperature >100.4    Complete by:  As directed      Diet - low sodium heart healthy    Complete by:  As directed      Increase activity slowly    Complete by:  As directed             Medication List    STOP taking these medications        FLOMAX 0.4 MG Caps capsule  Generic drug:  tamsulosin     promethazine 25 MG tablet  Commonly known as:  PHENERGAN     tiZANidine 2 MG tablet  Commonly known as:  ZANAFLEX     zolpidem 5 MG tablet  Commonly known as:  AMBIEN      TAKE these medications  acetaminophen 500 MG tablet  Commonly known as:  TYLENOL  Take 2 tablets (1,000 mg total) by mouth 2 (two) times daily.     ALPRAZolam 0.25 MG tablet  Commonly known as:  XANAX  Take 1 tablet (0.25 mg total) by mouth 2 (two) times daily as needed for anxiety.     atenolol 50 MG tablet  Commonly known as:  TENORMIN  Take 0.5 tablets (25 mg total) by mouth daily.     benzonatate 100 MG capsule  Commonly known as:  TESSALON PERLES  Take 2 capsules (200 mg total) by mouth 3 (three) times daily as needed for cough.     DEXILANT 60 MG capsule  Generic drug:  dexlansoprazole  TAKE ONE CAPSULE BY MOUTH EVERY DAY     docusate sodium 100 MG capsule  Commonly known as:  COLACE  Take 100 mg by mouth at bedtime.     DULoxetine 30 MG capsule  Commonly known as:  CYMBALTA  TAKE 1 CAPSULE BY MOUTH DAILY     HYDROcodone-acetaminophen 5-325 MG per tablet  Commonly known as:  NORCO  Take 1 tablet by mouth every 6  (six) hours as needed for moderate pain or severe pain.     latanoprost 0.005 % ophthalmic solution  Commonly known as:  XALATAN  Place 1 drop into both eyes at bedtime.     meclizine 12.5 MG tablet  Commonly known as:  ANTIVERT  TAKE 1 TABLET BY MOUTH THREE TIMES DAILY AS NEEDED FOR DIZZINESS     mirabegron ER 25 MG Tb24 tablet  Commonly known as:  MYRBETRIQ  Take 1 tablet (25 mg total) by mouth daily.     nitroGLYCERIN 0.4 MG SL tablet  Commonly known as:  NITROSTAT  Place 0.4 mg under the tongue every 5 (five) minutes as needed. Chest pain     ondansetron 4 MG tablet  Commonly known as:  ZOFRAN  Take 4 mg by mouth every 8 (eight) hours as needed for nausea.     polyethylene glycol powder powder  Commonly known as:  GLYCOLAX/MIRALAX  MIX 17 GRAMS WITH 4 TO 6 OUNCES OF WATER AND DRINK ONCE DAILY AS NEEDED FOR CONSTIPATION     Rivaroxaban 15 MG Tabs tablet  Commonly known as:  XARELTO  TAKE 1 TABLET BY MOUTH EVERY DAY WITH SUPPER       Follow-up Information    Follow up with Tonny Bollman, MD.   Specialty:  Cardiology   Why:  The office will call.   Contact information:   [address] [place] [address] [phone]        The results of significant diagnostics from this hospitalization (including imaging, microbiology, ancillary and laboratory) are listed below for reference.    Significant Diagnostic Studies: Dg Chest 2 View  05/21/2014   CLINICAL DATA:  Fall, weakness  EXAM: CHEST  2 VIEW  COMPARISON:  12/28/2012  FINDINGS: Cardiac stress that borderline cardiomegaly. Mild interstitial prominence bilaterally. Mild edema or pneumonitis cannot be excluded. No segmental infiltrate. Mild left basilar atelectasis.  IMPRESSION: Mild interstitial prominence bilaterally. Mild edema or pneumonitis cannot be excluded. No segmental infiltrate. Mild left basilar atelectasis   Electronically Signed   By: Natasha Mead M.D.   On: 05/21/2014 09:33   Dg Pelvis  1-2 Views  05/21/2014   CLINICAL DATA:  Fall  EXAM: PELVIS - 1-2 VIEW  COMPARISON:  None.  FINDINGS: There is no evidence of pelvic fracture or diastasis.  No pelvic bone lesions are seen. Pelvic phleboliths are noted. Mild degenerative changes bilateral hip joints.  IMPRESSION: No acute fracture or subluxation. Mild degenerative changes bilateral hip joints.   Electronically Signed   By: Natasha Mead M.D.   On: 05/21/2014 09:34   Dg Forearm Left  05/22/2014   CLINICAL DATA:  Fall.  Pain mid to distal left forearm.  Abrasion.  EXAM: LEFT FOREARM - 2 VIEW  COMPARISON:  None.  FINDINGS: The bones appear osteopenic. There is a subchondral cyst noted within the radial styloid. Patient's IV catheter overlies the ulnar styloid and precludes accurate assessment of this area. No displaced fractures identified.  IMPRESSION: 1. IV catheter overlies the radial styloid in precludes accurate assessment of this area. 2. Remaining portions of the forearm appear intact.   Electronically Signed   By: Signa Kell M.D.   On: 05/22/2014 13:25   Dg Wrist Complete Right  05/21/2014   CLINICAL DATA:  Fall right wrist laceration  EXAM: RIGHT WRIST - COMPLETE 3+ VIEW  COMPARISON:  None.  FINDINGS: Four views of the right wrist submitted. No acute fracture or subluxation. There is narrowing of radiocarpal joint space. Chondrocalcinosis. Mild degenerative changes first carpometacarpal joint.  IMPRESSION: No acute fracture or subluxation. Narrowing of radiocarpal joint space. Mild chondrocalcinosis radiocarpal joint. Degenerative changes first carpometacarpal joint.   Electronically Signed   By: Natasha Mead M.D.   On: 05/21/2014 09:35   Ct Head Wo Contrast  05/22/2014   CLINICAL DATA:  Syncopal episode  EXAM: CT HEAD WITHOUT CONTRAST  TECHNIQUE: Contiguous axial images were obtained from the base of the skull through the vertex without intravenous contrast.  COMPARISON:  05/21/2014  FINDINGS: New scalp laceration is noted in the right  frontal region. No underlying bony abnormality is noted. Atrophic and ischemic changes are again identified stable from the previous day. No findings to suggest acute hemorrhage, acute infarction or space-occupying mass lesion are noted.  IMPRESSION: Chronic atrophic and ischemic changes are seen.  New right frontal scalp laceration   Electronically Signed   By: Alcide Clever M.D.   On: 05/22/2014 13:30   Ct Head Wo Contrast  05/21/2014   CLINICAL DATA:  unwitnessed fall.  EXAM: CT HEAD WITHOUT CONTRAST  TECHNIQUE: Contiguous axial images were obtained from the base of the skull through the vertex without intravenous contrast.  COMPARISON:  12/16/2012  FINDINGS: There is diffuse low attenuation throughout the subcortical and periventricular white matter consistent with chronic microvascular disease. There is prominence of the sulci and ventricles consistent with brain atrophy. Chronic right basal ganglia lacunar infarcts noted. No acute cortical infarct, hemorrhage, or mass lesion ispresent.No significant extra-axial fluid collection is present. The paranasal sinuses andmastoid air cells are clear. The osseous skull is intact.  IMPRESSION: 1. No acute intracranial abnormalities. 2. Advanced small vessel ischemic disease and brain atrophy.   Electronically Signed   By: Signa Kell M.D.   On: 05/21/2014 09:07   Ct Cervical Spine Wo Contrast  05/22/2014   CLINICAL DATA:  Fall.  Forehead laceration.  EXAM: CT CERVICAL SPINE WITHOUT CONTRAST  TECHNIQUE: Multidetector CT imaging of the cervical spine was performed without intravenous contrast. Multiplanar CT image reconstructions were also generated.  COMPARISON:  03/31/2009  FINDINGS: Prominent degenerative spurring at the C1- 2 articulation particularly on the left. Spurring might impinge upon the left C2 nerve.  Left osseous foraminal stenosis suspected at C3-4 due to uncinate and facet spurring. There is potentially mild osseous foraminal  stenosis at C5-6 as  well on the left side, and there is osseous foraminal stenosis on the right at C3-4 again due to spurring. Posterior osseous ridging at C5-6. Biapical pleural parenchymal scarring.  No cervical spine fracture or acute subluxation.  IMPRESSION: 1. No acute cervical spine findings. Multilevel spurring causes foraminal impingement.   Electronically Signed   By: Gaylyn Rong M.D.   On: 05/22/2014 16:42   US Renal  05/24/2014   CLINICAL DATA:  Acute kidney injury.  EXAM: RENAL/URINARY TRACT ULTRASOUND COMPLETE  COMPARISON:  03/31/2014  FINDINGS: Right Kidney:  Length: 11.4 cm.  Echogenic kidney with cortical thinning.  Left Kidney:  Length: 9.7 cm.  Echogenic kidney with cortical thinning.  Bladder:  Appears normal for degree of bladder distention. Prostate gland measures 3.7 by 2.6 by 5.1 cm.  IMPRESSION: 1. The tiny punctate right renal calculi seen on prior CT from 03/31/2014 are not well observed, although these are in the 1-2 mm range in which sonography is not considered highly sensitive. 2. Echogenic renal cortex suggesting underlying chronic medical renal disease.   Electronically Signed   By: Gaylyn Rong M.D.   On: 05/24/2014 17:23   Nm Pulmonary Perf And Vent  05/22/2014   CLINICAL DATA:  History of pulmonary embolus.  On blood thinner.  EXAM: NUCLEAR MEDICINE VENTILATION - PERFUSION LUNG SCAN  TECHNIQUE: Ventilation images were obtained in multiple projections using inhaled aerosol technetium 99 M DTPA. Perfusion images were obtained in multiple projections after intravenous injection of Tc-36m MAA.  RADIOPHARMACEUTICALS:  Six mCi Tc-47m DTPA aerosol and 40 mCi Tc-66m MAA  COMPARISON:  CXR 05/22/2014  FINDINGS: Ventilation: No focal ventilation defect.  Perfusion: No wedge shaped peripheral perfusion defects to suggest acute pulmonary embolism.  IMPRESSION: Very low probability for pulmonary embolism.   Electronically Signed   By: Marlan Palau M.D.   On: 05/22/2014 15:26   Dg Chest  Portable 1 View  05/22/2014   CLINICAL DATA:  Loss of consciousness.  Cough  EXAM: PORTABLE CHEST - 1 VIEW  COMPARISON:  05/21/2014  FINDINGS: Heart size is normal. There is no pleural effusion or edema. Lung volumes are low. No airspace consolidation identified.  IMPRESSION: 1. No acute cardiopulmonary abnormalities. 2. Low lung volumes.   Electronically Signed   By: Signa Kell M.D.   On: 05/22/2014 12:13    Microbiology: No results found for this or any previous visit (from the past 240 hour(s)).   Labs: Basic Metabolic Panel:  Recent Labs Lab 05/22/14 1135 05/22/14 1830 05/23/14 0445 05/24/14 0430  NA 139  --  141 140  K 4.0  --  3.6 3.9  CL 109  --  111 112  CO2 21  --  22 23  GLUCOSE 125*  --  85 95  BUN 26*  --  21 17  CREATININE 1.60*  --  1.37* 1.39*  CALCIUM 8.3*  --  8.0* 8.0*  MG  --  1.9  --   --    Liver Function Tests:  Recent Labs Lab 05/23/14 0445  AST 38*  ALT 17  ALKPHOS 53  BILITOT 0.7  PROT 6.3  ALBUMIN 2.7*   No results for input(s): LIPASE, AMYLASE in the last 168 hours. No results for input(s): AMMONIA in the last 168 hours. CBC:  Recent Labs Lab 05/22/14 1135 05/23/14 0445 05/24/14 0430  WBC 4.9 3.8* 4.4  NEUTROABS 3.0  --   --   HGB 12.8* 12.4* 12.1*  HCT 38.5*  37.9* 36.4*  MCV 97.2 96.2 95.5  PLT 110* 109* 112*   Cardiac Enzymes:  Recent Labs Lab 05/22/14 1830 05/22/14 2310 05/23/14 0445  TROPONINI 2.01* 2.25* 1.93*   BNP: BNP (last 3 results) No results for input(s): BNP in the last 8760 hours.  ProBNP (last 3 results) No results for input(s): PROBNP in the last 8760 hours.  CBG: No results for input(s): GLUCAP in the last 168 hours.      Signed:  Marcellus ScottHONGALGI,Nyimah Shadduck, MD, FACP, FHM. [facility] Pager [phone]  If 7PM-7AM, please contact night-coverage [email or web address] Password [email or web address] 05/26/2014, 12:25 PM

## 2014-05-26 NOTE — Progress Notes (Signed)
Pt discharged to skilled facility via stretcher per EMS, condition stable.

## 2014-05-26 NOTE — Progress Notes (Signed)
CSW Proofreader(Clinical Social Worker) spoke with pt daughter who confirms they would like [facility]. CSW notified facility of bed acceptance and plan for dc today.  Takiesha Mcdevitt, LCSWA [phone]

## 2014-05-26 NOTE — Progress Notes (Signed)
CSW (Clinical Social Worker) prepared pt dc packet and placed with shadow chart. CSW arranged non-emergent ambulance transport. Pt, pt family, pt nurse, and facility informed. CSW signing off.  Jason [name], LCSWA [phone]  

## 2014-05-26 NOTE — Progress Notes (Signed)
       Patient Name: Jason [name] Date of Encounter: 05/26/2014    SUBJECTIVE:Lying flat. No chest pain or dyspnea.  TELEMETRY:  NSR/SB with HR 50 to 85 BPM. No A fib noted Filed Vitals:   05/25/14 0900 05/25/14 1700 05/25/14 2041 05/26/14 0500  BP: 160/60 146/65 150/66 186/82  Pulse:  54 52 59  Temp:  98 F (36.7 C) 97.9 F (36.6 C) 97.7 F (36.5 C)  TempSrc:  Oral    Resp:  16 18 21   Height:      Weight:    175 lb 11.2 oz (79.697 kg)  SpO2:  95% 96% 97%    Intake/Output Summary (Last 24 hours) at 05/26/14 1213 Last data filed at 05/26/14 0717  Gross per 24 hour  Intake    160 ml  Output    750 ml  Net   -590 ml   LABS: Basic Metabolic Panel:  Recent Labs  64/40/3403/13/16 0430  NA 140  K 3.9  CL 112  CO2 23  GLUCOSE 95  BUN 17  CREATININE 1.39*  CALCIUM 8.0*   CBC:  Recent Labs  05/24/14 0430  WBC 4.4  HGB 12.1*  HCT 36.4*  MCV 95.5  PLT 112*   Radiology/Studies:  No new data  Physical Exam: Blood pressure 186/82, pulse 59, temperature 97.7 F (36.5 C), temperature source Oral, resp. rate 21, height 5\' 10"  (1.778 m), weight 175 lb 11.2 oz (79.697 kg), SpO2 97 %. Weight change:   Wt Readings from Last 3 Encounters:  05/26/14 175 lb 11.2 oz (79.697 kg)  05/19/14 184 lb (83.462 kg)  03/31/14 185 lb (83.915 kg)   Lying flat Chest clear Regular rhythm  ASSESSMENT:  1. Syncope likely multifactorial with orthostasis as a contributing component. I also believe frailty and weakness contributes to the recent events. 2. Elevated systolic BP but believe this is a better situation than "normal BP' as it makes fainting less likely 3. H/O PAF. May be at risk on low dose atenolol for break through tachycardia, but so far okay.  Plan:  No further recommendation Probably not reasonable to expect independent living in future. Please call if we can help further.  Selinda EonSigned, Amun Stemm III,Ciana Simmon W 05/26/2014, 12:13 PM

## 2014-05-31 DIAGNOSIS — K5900 Constipation, unspecified: Secondary | ICD-10-CM | POA: Diagnosis not present

## 2014-05-31 DIAGNOSIS — I480 Paroxysmal atrial fibrillation: Secondary | ICD-10-CM | POA: Diagnosis not present

## 2014-05-31 DIAGNOSIS — I2510 Atherosclerotic heart disease of native coronary artery without angina pectoris: Secondary | ICD-10-CM | POA: Diagnosis not present

## 2014-05-31 DIAGNOSIS — R55 Syncope and collapse: Secondary | ICD-10-CM | POA: Diagnosis not present

## 2014-05-31 DIAGNOSIS — R269 Unspecified abnormalities of gait and mobility: Secondary | ICD-10-CM | POA: Diagnosis not present

## 2014-06-03 DIAGNOSIS — R3915 Urgency of urination: Secondary | ICD-10-CM | POA: Diagnosis not present

## 2014-06-03 DIAGNOSIS — R339 Retention of urine, unspecified: Secondary | ICD-10-CM | POA: Diagnosis not present

## 2014-06-19 ENCOUNTER — Telehealth: Payer: Self-pay | Admitting: Family Medicine

## 2014-06-19 DIAGNOSIS — Z8673 Personal history of transient ischemic attack (TIA), and cerebral infarction without residual deficits: Secondary | ICD-10-CM | POA: Diagnosis not present

## 2014-06-19 DIAGNOSIS — R269 Unspecified abnormalities of gait and mobility: Secondary | ICD-10-CM | POA: Diagnosis not present

## 2014-06-19 DIAGNOSIS — Z9181 History of falling: Secondary | ICD-10-CM | POA: Diagnosis not present

## 2014-06-19 NOTE — Telephone Encounter (Signed)
Patient is at Assisted Living facility.  Daughter is calling because father told her he is having pain under his right arm.  No other information.  He told staff early this morning and was given a Hydrocodone which he says has not helped.  Daughter has not seen him and has no further information.  Has not spoken to him since around 930 AM. Should she be concerned. Told daughter to call back to facility and have someone to check on him and gather more information and take his vital signs.  There should be a medical person there (medical coordinator being that it is Friday)   Call back with information and we can better advise.

## 2014-06-19 NOTE — Telephone Encounter (Signed)
Call from Occupational Therapist from [facility] at [facility]isted [facility] with patient.  Pulse rate is 48, pt C/O general malaise.  BP 138/68, pulse this morning was 52.  Spoke to provider, told to stop Atenolol (will fax D/C order to [facility])  Told this to therapist.  Pt has appt Monday here.  If in acute distress have transported to ED.  Otherwise will see on Monday.

## 2014-06-19 NOTE — Telephone Encounter (Signed)
Had call from Occ Therapy with pt at facility.  See that phone note

## 2014-06-22 ENCOUNTER — Telehealth: Payer: Self-pay | Admitting: Family Medicine

## 2014-06-22 ENCOUNTER — Encounter: Payer: Self-pay | Admitting: Family Medicine

## 2014-06-22 ENCOUNTER — Ambulatory Visit (INDEPENDENT_AMBULATORY_CARE_PROVIDER_SITE_OTHER): Payer: Medicare Other | Admitting: Family Medicine

## 2014-06-22 VITALS — BP 130/70 | HR 62 | Temp 98.0°F | Resp 14 | Ht 70.0 in | Wt 179.0 lb

## 2014-06-22 DIAGNOSIS — R42 Dizziness and giddiness: Secondary | ICD-10-CM

## 2014-06-22 DIAGNOSIS — M25572 Pain in left ankle and joints of left foot: Secondary | ICD-10-CM | POA: Diagnosis not present

## 2014-06-22 DIAGNOSIS — Z09 Encounter for follow-up examination after completed treatment for conditions other than malignant neoplasm: Secondary | ICD-10-CM

## 2014-06-22 DIAGNOSIS — R269 Unspecified abnormalities of gait and mobility: Secondary | ICD-10-CM | POA: Diagnosis not present

## 2014-06-22 DIAGNOSIS — Z9181 History of falling: Secondary | ICD-10-CM | POA: Diagnosis not present

## 2014-06-22 DIAGNOSIS — Z8673 Personal history of transient ischemic attack (TIA), and cerebral infarction without residual deficits: Secondary | ICD-10-CM | POA: Diagnosis not present

## 2014-06-22 MED ORDER — DICLOFENAC SODIUM 1 % TD GEL: 2.0000 g | Freq: Four times a day (QID) | TRANSDERMAL | Status: DC

## 2014-06-22 NOTE — Progress Notes (Signed)
Subjective:    Patient ID: Jason [name], male    DOB: 01-Apr-1919, 81 y.o.   MRN: [id number]  HPI Please see my last office visit. Patient had an upper respiratory infection over the remainder of that week for upper respiratory infection worsened and he became progressively sicker. He was taking less oral intake throughout the week. We had also recently started the patient on atenolol for hypertension.  Due to a combination of factors, the patient's worsening sickness, his dehydration, and the atenolol, the patient became very lightheaded and actually had a syncopal episode. Patient passed out and lacerated his forehead on a dresser. He was admitted to the hospital for observations and a thorough evaluation. I have copied relevant portions of the discharge summary and included them below for my reference:  Admit date: 05/22/2014 Discharge date: 05/26/2014  Time spent: Greater than 30 minutes  Recommendations for Outpatient Follow-up:  1. M.D. at SNF in 3 days with repeat labs (CBC & BMP) 2. Dr. Emeline GinsStephen Dahlsteadt, Urology: SNF to make appointment to follow-up in one week.  Discharge Diagnoses:  Principal Problem:  Syncope and collapse Active Problems:  HYPERTENSION, BENIGN  VERTIGO  Thrombocytopenia  PAF (paroxysmal atrial fibrillation)  Breast pain  Scalp laceration  Acute renal failure superimposed on stage 3 chronic kidney disease  Acute bronchitis  Contusion   Discharge Condition: Improved & Stable  Diet recommendation: Heart healthy diet.  Filed Weights   05/24/14 0600 05/26/14 0500  Weight: 79.833 kg (176 lb) 79.697 kg (175 lb 11.2 oz)    History of present illness:  81 y.o. 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 Patient lives at an assisted living  facility and ambulates with the help of a walker. He has chronic vertigo and has unsteady gait with several near falls or falls. His wife is in residential hospice. He recently developed URI symptoms associated with poor oral intake. She sustained a mechanical fall on 3/10 and discharged from the ED after negative workup. Returned on 3/11 after syncope, fall and right frontal scalp laceration. In the ED, CT head negative for acute findings. CT neck pending. Creatinine 1.6, hemoglobin 12.8, platelets 110 and troponin elevated. Cardiology has evaluated. Right frontal scalp laceration sutured by EDP. He has chronic urinary frequency and has to urinate every 2 hours.  Hospital Course:     1. Syncope and collapse: Likely multifactorial secondary to orthostatic hypotension as the main factor complicating generalized debility and fraility from advanced age and bradycardia. He was briefly hydrated with IV fluids. Atenolol dose was reduced from 50 mg to 25 mg daily. He may not tolerate too tight control of his blood pressures and should allow for permissive higher blood pressures. 2-D echo: LVEF 50-55 percent. VQ scan: Low probability for PE. DC Flomax.  2. Dehydration: Related to URI and poor oral intake. Resolved after brief IV fluids. 3. Acute on stage III chronic kidney disease: Precipitated by dehydration. Acute renal failure resolved. Renal ultrasound: Medical renal disease. Creatinine probably at baseline. 4. BPH with urinary frequency/overactive bladder: PVR ? 233 mls. DC Flomax d/t concerns for Orthostasis. The patient apparently had in and out cath by coud catheter in ED. Mild intermittent hematuria - resolved. Discussed with urologist on call 3/12: Options limited. Increasing Flomax could cause orthostatic hypotension. May try  Myrbetriq-expensive and insurance is may not cover (daughter wishes to try this and hence was started). Also recommended placing Foley catheter for bladder rest and outpatient  follow-up with urology. Patient probably has overactive bladder. No significant change. Recommend outpatient follow-up with primary urologist for further recommendations. 5. Elevated troponin/likely demand ischemia: No acute EKG changes. No chest pain. Cardiology has consulted. Trend troponin- peak 2.25. 2-D echo: LVEF 50-55%. No further workup recommended by cardiology. 6. Acute viral bronchitis: Chest x-ray negative for acute findings. DC levofloxacin and treat supportively. Improved 7. History of recurrent DVT/PE & PAF: Held xarelto (as discussed with cardiology) 3/11 secondary to scalp laceration and suturing. Patient has history of chronic dizziness/vertigo, unsteady gait and frequent falls/near falls. Currently discharging to SNF where he can be closely monitored and may reduce fall risk. 8. Right frontal scalp laceration: Status post suturing. No active bleeding. 9. Prolonged QTC: DC'ed levofloxacin. Check magnesium: 1.9. Replaced K. QTC 491 10. Thrombocytopenia: Stable 11. PAF: Sinus bradycardia. Atenolol reduced from 50 mg to 25 MG daily with heart rates holding steady in the 50s. 12. Hard of hearing      He is here today for follow-up. His polyuria has improved dramatically on Myrbetriq. He denies any complications since starting this medication and discontinuing Flomax. His blood pressure is excellent today however his home health agency called me Friday stating that his heart rate was 48. Therefore we have completely discontinued atenolol. Today his heart rate is 62. He does continue to complain of dizziness and disequilibrium. He is also complaining of pain in his right foot particular the second toe. On examination the patient has a hammertoe with erythema and swelling of the IP joints. There is no evidence of cellulitis or septic arthritis Past Medical History  Diagnosis Date  . PVD (peripheral vascular disease)   . CAD (coronary artery disease)     a. 12/2005 - PCI to OM  .  Hyperlipidemia   . Cerebrovascular accident   . DVT femoral (deep venous thrombosis) with thrombophlebitis 03/2007    [name] 07/13/2010  . GERD (gastroesophageal reflux disease)   . Edema   . Anxiety   . Vertigo   . IBS (irritable bowel syndrome)   . Duodenitis   . Gastritis   . Esophageal stricture   . Cellulitis   . Pulmonary embolism   . Prostatic hypertrophy   . Frequent falls   . Subdural hematoma   . PTSD (post-traumatic stress disorder)   . Vertigo   . TIA (transient ischemic attack)     a. 01/2006 and 05/2006.   [name] Hiatal hernia     periferal neuropathy  . Hx of echocardiogram     Echo 9/13:  Mild LVH, EF 60-65%, Gr 1 diast dysfn, mild AI, mild BAE  . Gout   . Chronic low back pain   . Gait disorder   . Peripheral neuropathy   . Hydrocele   . History of shingles     Left lumbar  . HOH (hard of hearing)     hearing aid  . Myocardial infarction "years ago"    "dr said I'd had a small one"  . Daily headache   . Depression   . Urinary frequency   . Blind left eye    Past Surgical History  Procedure Laterality Date  . Hand surgery Left     "replaced bone in my finger"  . Angioplasty    . Carotid endarterectomy Left   . Coronary angioplasty with stent  placement      left circumflex  . Cataract extraction, bilateral    . Tonsillectomy    . Tympanic membrane repair Right 1978    [name] 07/27/2010; "couldn'getting so t couldn't hear; had noise in my ear; didn't correct either  . Hand contracture release Right     palm/notes 07/27/2010  . Esophagogastroduodenoscopy (egd) with esophageal dilation     Current Outpatient Prescriptions on File Prior to Visit  Medication Sig Dispense Refill  . acetaminophen (TYLENOL) 500 MG tablet Take 2 tablets (1,000 mg total) by mouth 2 (two) times daily. 60 tablet 11  . ALPRAZolam (XANAX) 0.25 MG tablet Take 1 tablet (0.25 mg total) by mouth 2 (two) times daily as needed for anxiety. 10 tablet 0  . benzonatate (TESSALON PERLES) 100  MG capsule Take 2 capsules (200 mg total) by mouth 3 (three) times daily as needed for cough. 20 capsule 0  . DEXILANT 60 MG capsule TAKE ONE CAPSULE BY MOUTH EVERY DAY 30 capsule 11  . docusate sodium (COLACE) 100 MG capsule Take 100 mg by mouth at bedtime.    . DULoxetine (CYMBALTA) 30 MG capsule TAKE 1 CAPSULE BY MOUTH DAILY 30 capsule 6  . HYDROcodone-acetaminophen (NORCO) 5-325 MG per tablet Take 1 tablet by mouth every 6 (six) hours as needed for moderate pain or severe pain. 10 tablet 0  . latanoprost (XALATAN) 0.005 % ophthalmic solution Place 1 drop into both eyes at bedtime.    . mirabegron ER (MYRBETRIQ) 25 MG TB24 tablet Take 1 tablet (25 mg total) by mouth daily.    . nitroGLYCERIN (NITROSTAT) 0.4 MG SL tablet Place 0.4 mg under the tongue every 5 (five) minutes as needed. Chest pain    . ondansetron (ZOFRAN) 4 MG tablet Take 4 mg by mouth every 8 (eight) hours as needed for nausea.    . polyethylene glycol powder (GLYCOLAX/MIRALAX) powder MIX 17 GRAMS WITH 4 TO 6 OUNCES OF WATER AND DRINK ONCE DAILY AS NEEDED FOR CONSTIPATION 527 g 2  . Rivaroxaban (XARELTO) 15 MG TABS tablet TAKE 1 TABLET BY MOUTH EVERY DAY WITH SUPPER 30 tablet 4  . atenolol (TENORMIN) 50 MG tablet Take 0.5 tablets (25 mg total) by mouth daily. (Patient not taking: Reported on 06/22/2014)    . meclizine (ANTIVERT) 12.5 MG tablet TAKE 1 TABLET BY MOUTH THREE TIMES DAILY AS NEEDED FOR DIZZINESS (Patient not taking: Reported on 06/22/2014) 30 tablet 0   No current facility-administered medications on file prior to visit.   Allergies  Allergen Reactions  . Ativan [Lorazepam]   . Augmentin [Amoxicillin-Pot Clavulanate] Nausea And Vomiting    daughter states he can tolerate Amoxicillin ok  . Fludrocortisone Acetate Other (See Comments)    unknown  . Scopace [Scopolamine] Other (See Comments)    *patch Unknown reaction   History   Social History  . Marital Status: Married    Spouse Name: N/A  . Number of  Children: N/A  . Years of Education: N/A   Occupational History  . Not on file.   Social History Main Topics  . Smoking status: Never Smoker   . Smokeless tobacco: Never Used  . Alcohol Use: 1.2 oz/week    2 Glasses of wine per week  . Drug Use: No  . Sexual Activity: No   Other Topics Concern  . 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      Objective:   Physical Exam  Cardiovascular: Normal rate, regular rhythm and normal heart sounds.   No murmur heard. Pulmonary/Chest: Effort normal and breath sounds normal. No respiratory distress. He has no wheezes. He has no rales.  Abdominal: Soft. Bowel sounds are normal. He exhibits no distension. There is no tenderness. There is no rebound.  Musculoskeletal: He exhibits no edema.       Right foot: There is tenderness, bony tenderness and deformity. There is normal range of motion.  Skin: There is erythema.  Vitals reviewed.  Has a hammertoe in his right second toe       Assessment & Plan:  Pain in joint, ankle and foot, left - Plan: diclofenac sodium (VOLTAREN) 1 % GEL  Hospital discharge follow-up  Dizziness and giddiness  Given the patient's chronic kidney disease, his chronic anticoagulation, and his occasional anxiety, I do not believe the patient would benefit from an NSAID. He is only on Tylenol. I want to avoid increasing any narcotic pain medication. Therefore I will start the patient on Voltaren gel 2 g applied up to 4 times a day to the painful joint as needed. I believe as long as the patient's blood pressure is less than 160 systolic his blood pressures acceptable. Therefore I will avoid tight control his blood pressure due to the risk of falls and head trauma. I will schedule the patient for vestibular rehabilitation at [facility] neurology/physical therapy to help reduce risk of  falls.

## 2014-06-22 NOTE — Telephone Encounter (Signed)
Jason [name] from [facility] calling to get order sent over for additional pt after being in hospital   [phone]

## 2014-06-23 NOTE — Telephone Encounter (Signed)
LM giving verbal ok for PT and if she needs an order faxed I need fax number and how many visits

## 2014-06-24 DIAGNOSIS — R269 Unspecified abnormalities of gait and mobility: Secondary | ICD-10-CM | POA: Diagnosis not present

## 2014-06-24 DIAGNOSIS — Z8673 Personal history of transient ischemic attack (TIA), and cerebral infarction without residual deficits: Secondary | ICD-10-CM | POA: Diagnosis not present

## 2014-06-24 DIAGNOSIS — Z9181 History of falling: Secondary | ICD-10-CM | POA: Diagnosis not present

## 2014-06-25 DIAGNOSIS — Z9181 History of falling: Secondary | ICD-10-CM | POA: Diagnosis not present

## 2014-06-25 DIAGNOSIS — Z8673 Personal history of transient ischemic attack (TIA), and cerebral infarction without residual deficits: Secondary | ICD-10-CM | POA: Diagnosis not present

## 2014-06-25 DIAGNOSIS — R269 Unspecified abnormalities of gait and mobility: Secondary | ICD-10-CM | POA: Diagnosis not present

## 2014-06-29 ENCOUNTER — Encounter: Payer: Self-pay | Admitting: Family Medicine

## 2014-06-29 ENCOUNTER — Encounter: Payer: Self-pay | Admitting: Physician Assistant

## 2014-06-29 ENCOUNTER — Ambulatory Visit (INDEPENDENT_AMBULATORY_CARE_PROVIDER_SITE_OTHER): Payer: Medicare Other | Admitting: Physician Assistant

## 2014-06-29 ENCOUNTER — Telehealth: Payer: Self-pay | Admitting: Family Medicine

## 2014-06-29 VITALS — BP 100/60 | HR 89 | Ht 70.0 in | Wt 176.0 lb

## 2014-06-29 DIAGNOSIS — N183 Chronic kidney disease, stage 3 unspecified: Secondary | ICD-10-CM

## 2014-06-29 DIAGNOSIS — I2510 Atherosclerotic heart disease of native coronary artery without angina pectoris: Secondary | ICD-10-CM

## 2014-06-29 DIAGNOSIS — I480 Paroxysmal atrial fibrillation: Secondary | ICD-10-CM | POA: Diagnosis not present

## 2014-06-29 DIAGNOSIS — I951 Orthostatic hypotension: Secondary | ICD-10-CM | POA: Diagnosis not present

## 2014-06-29 DIAGNOSIS — I10 Essential (primary) hypertension: Secondary | ICD-10-CM

## 2014-06-29 NOTE — Progress Notes (Signed)
Cardiology Office Note   Date:  06/29/2014   ID:  Jason RegalPaul [name] Dickenson, DOB November 24, 1919, MRN [id number]  PCP:  Leo GrosserPICKARD,[name], MD  Cardiologist:  Tonny BollmanMichael Cooper, M.D.  Chief Complaint: Post hospital follow-up of syncope    History of Present Illness: Jason [name] Goodell is a 81 y.o. male who presents for post hospital follow-up of syncope that was felt to be multifactorial with orthostatic hypotension. Patient developed pneumonia and  then became dehydrated and had syncope. It was felt that it was better to leave him with an elevated systolic blood pressure and trying to normalize that with his syncope. His troponins were elevated but he had no chest pain and it was felt due to demand ischemia. His atenolol was reduced to 25 mg daily due to bradycardia and orthostasis. Levaquin was stopped due to prolonged QT. Ischemic workup was not pursued given his advanced age. 2-D echo showed normal LV function EF 50-55% with no regional wall motion abnormalities, mild AI, mild MR, moderately dilated LA, PA peak pressure 41 mmHg.  He also has paroxysmal atrial fibrillation and has maintained normal sinus rhythm. He is on Xarelto adjusted dose for reduced creatinine clearance. He also has coronary artery disease status post remote stenting of the left circumflex. He has had recurrent DVTs and undergone IVC filter placement.  Since discharge his atenolol was stopped completely because of a heart rate of 48.  Overall patient is not feeling well at all. He continues to have dizziness when he stands up and is very unsteady. When he gets dizzy he becomes nauseous and feels bad for a couple days. Since he atenolol was stopped he is having occasional palpitations associated with dyspnea. Her short lived and haven't seemed to other him too much.    Past Medical History  Diagnosis Date  . PVD (peripheral vascular disease)   . CAD (coronary artery disease)     a. 12/2005 - PCI to OM  . Hyperlipidemia   . Cerebrovascular  accident   . DVT femoral (deep venous thrombosis) with thrombophlebitis 03/2007    [name]/notes 07/13/2010  . GERD (gastroesophageal reflux disease)   . Edema   . Anxiety   . Vertigo   . IBS (irritable bowel syndrome)   . Duodenitis   . Gastritis   . Esophageal stricture   . Cellulitis   . Pulmonary embolism   . Prostatic hypertrophy   . Frequent falls   . Subdural hematoma   . PTSD (post-traumatic stress disorder)   . Vertigo   . TIA (transient ischemic attack)     a. 01/2006 and 05/2006.   [name]. Hiatal hernia     periferal neuropathy  . Hx of echocardiogram     Echo 9/13:  Mild LVH, EF 60-65%, Gr 1 diast dysfn, mild AI, mild BAE  . Gout   . Chronic low back pain   . Gait disorder   . Peripheral neuropathy   . Hydrocele   . History of shingles     Left lumbar  . HOH (hard of hearing)     hearing aid  . Myocardial infarction "years ago"    "dr said I'd had a small one"  . Daily headache   . Depression   . Urinary frequency   . Blind left eye     Past Surgical History  Procedure Laterality Date  . Hand surgery Left     "replaced bone in my finger"  . Angioplasty    . Carotid endarterectomy Left   .  Coronary angioplasty with stent placement      left circumflex  . Cataract extraction, bilateral    . Tonsillectomy    . Tympanic membrane repair Right 1978    [name] 07/27/2010; "couldn'getting so t couldn't hear; had noise in my ear; didn't correct either  . Hand contracture release Right     palm/notes 07/27/2010  . Esophagogastroduodenoscopy (egd) with esophageal dilation       Current Outpatient Prescriptions  Medication Sig Dispense Refill  . acetaminophen (TYLENOL) 500 MG tablet Take 2 tablets (1,000 mg total) by mouth 2 (two) times daily. 60 tablet 11  . ALPRAZolam (XANAX) 0.25 MG tablet Take 1 tablet (0.25 mg total) by mouth 2 (two) times daily as needed for anxiety. 10 tablet 0  . benzonatate (TESSALON PERLES) 100 MG capsule Take 2 capsules (200 mg total) by mouth 3  (three) times daily as needed for cough. 20 capsule 0  . DEXILANT 60 MG capsule TAKE ONE CAPSULE BY MOUTH EVERY DAY 30 capsule 11  . diclofenac sodium (VOLTAREN) 1 % GEL Apply 2 g topically 4 (four) times daily. 100 g 5  . docusate sodium (COLACE) 100 MG capsule Take 100 mg by mouth at bedtime.    . DULoxetine (CYMBALTA) 30 MG capsule TAKE 1 CAPSULE BY MOUTH DAILY 30 capsule 6  . HYDROcodone-acetaminophen (NORCO) 5-325 MG per tablet Take 1 tablet by mouth every 6 (six) hours as needed for moderate pain or severe pain. 10 tablet 0  . latanoprost (XALATAN) 0.005 % ophthalmic solution Place 1 drop into both eyes at bedtime.    . mirabegron ER (MYRBETRIQ) 25 MG TB24 tablet Take 1 tablet (25 mg total) by mouth daily.    . nitroGLYCERIN (NITROSTAT) 0.4 MG SL tablet Place 0.4 mg under the tongue every 5 (five) minutes as needed. Chest pain    . ondansetron (ZOFRAN) 4 MG tablet Take 4 mg by mouth every 8 (eight) hours as needed for nausea.    . polyethylene glycol powder (GLYCOLAX/MIRALAX) powder MIX 17 GRAMS WITH 4 TO 6 OUNCES OF WATER AND DRINK ONCE DAILY AS NEEDED FOR CONSTIPATION 527 g 2  . Rivaroxaban (XARELTO) 15 MG TABS tablet TAKE 1 TABLET BY MOUTH EVERY DAY WITH SUPPER 30 tablet 4   No current facility-administered medications for this visit.    Allergies:   Ativan; Augmentin; Fludrocortisone acetate; and Scopace    Social History:  The patient  reports that he has never smoked. He has never used smokeless tobacco. He reports that he drinks about 1.2 oz of alcohol per week. He reports that he does not use illicit drugs.   Family History:  The patient's    family history includes Breast cancer in his sister; Coronary artery disease in his brother; Dementia in his sister; Heart attack in his father; Migraines in his mother; Renal Disease in his brother. There is no history of Colon cancer.    ROS:  Please see the history of present illness.   Otherwise, review of systems are positive for  chronic pain from neuropathy and hammertoes week, elderly chronic dizziness and nausea.   All other systems are reviewed and negative.    PHYSICAL EXAM: VS:  BP 124/78 mmHg  Pulse 89  Ht  (1.778 m)  Wt 176 lb (79.833 kg)  BMI 25.25 kg/m2 , BMI Body mass index is 25.25 kg/(m^2). BP dropped 100/60 when I stood him up GEN: Elderly, well developed, in no acute distress Neck: no JVD, HJR, carotid bruits,  or masses Cardiac:  RRR; 2/6 systolic murmur at the left sternal border, no gallop, rubs, thrill or heave,  Respiratory:  clear to auscultation bilaterally, normal work of breathing GI: soft, nontender, nondistended, + BS MS: no deformity or atrophy Extremities: Cyanosis of both feet with decreased pulses no edema hammertoes Skin: warm and dry, no rash Neuro:  Strength and sensation are intact    EKG:  EKG is ordered today. The ekg ordered today demonstrates normal sinus rhythm at 89 bpm with right bundle branch block   Recent Labs: 01/13/2014: TSH 4.174 05/22/2014: Magnesium 1.9 05/23/2014: ALT 17 05/24/2014: BUN 17; Creatinine 1.39*; Hemoglobin 12.1*; Platelets 112*; Potassium 3.9; Sodium 140    Lipid Panel    Component Value Date/Time   CHOL 211* 01/18/2007 0846   TRIG 81 01/18/2007 0846   TRIG 118 02/26/2006 1112   HDL 37.0* 01/18/2007 0846   CHOLHDL 5.7 CALC 01/18/2007 0846   CHOLHDL 5.6 CALC 02/26/2006 1112   VLDL 16 01/18/2007 0846   LDLDIRECT 155.4 01/18/2007 0846   LDLDIRECT 153.9 02/26/2006 1112      Wt Readings from Last 3 Encounters:  06/29/14 176 lb (79.833 kg)  06/22/14 179 lb (81.194 kg)  05/26/14 175 lb 11.2 oz (79.697 kg)      Other studies Reviewed: Additional studies/ records that were reviewed today include and review of the records demonstrates:   2D echo  05/23/14 Study Conclusions  - Left ventricle: The cavity size was normal. Wall thickness was   increased in a pattern of mild LVH. Systolic function was normal.   The estimated ejection  fraction was in the range of 50% to 55%.   Wall motion was normal; there were no regional wall motion   abnormalities. - Aortic valve: There was mild regurgitation. - Mitral valve: There was mild regurgitation. - Left atrium: The atrium was moderately dilated. - Pulmonary arteries: Systolic pressure was mildly increased. PA   peak pressure: 41 mm Hg (S).   ASSESSMENT AND PLAN: Orthostatic hypotension Patient continues to be orthostatic in the office today despite being off atenolol. He is  not drinking properly and is dehydrated. His doctor states that he does not like to drink water or drink during the day. I asked her to try Gatorade or another flavored drink to try to get him hydrated. I explained the importance of this to the patient in preventing further syncope.   PAF (paroxysmal atrial fibrillation) Patient is in normal sinus rhythm today. He is having some palpitations since the atenolol was stopped. I discussed this patient with Dr. Johney Frame. Because of his orthostatic hypotension we are very limited in treatment. He recommends hydration and no further rate lowering medications at this time. Follow-up with Dr. Excell Seltzer.   CAD (coronary artery disease) Stable without chest pain      Signed, Jacolyn Reedy, PA-C  06/29/2014 3:01 PM    [facility] [address] [place], [place], [place]  [address] Phone: [phone]; Fax: [phone]

## 2014-06-29 NOTE — Assessment & Plan Note (Signed)
Patient is in normal sinus rhythm today. He is having some palpitations since the atenolol was stopped. I discussed this patient with Dr. Johney FrameAllred. Because of his orthostatic hypotension we are very limited in treatment. He recommends hydration and no further rate lowering medications at this time. Follow-up with Dr. Excell Seltzerooper.

## 2014-06-29 NOTE — Telephone Encounter (Signed)
Approved through 06/29/2015 - faxed to pharm and pt daughter aware of approval.

## 2014-06-29 NOTE — Telephone Encounter (Signed)
This encounter was created in error - please disregard.

## 2014-06-29 NOTE — Assessment & Plan Note (Signed)
Patient continues to be orthostatic in the office today despite being off atenolol. He is  not drinking properly and is dehydrated. His doctor states that he does not like to drink water or drink during the day. I asked her to try Gatorade or another flavored drink to try to get him hydrated. I explained the importance of this to the patient in preventing further syncope.

## 2014-06-29 NOTE — Telephone Encounter (Signed)
[phone] PT daughter has called and stated that prescription that was called in for her father regarding his toe according to [facility] is still pending due to the insurance and the pt daughter is wanting to know if she needs to do anything or if we  Need to? It was the volatern gel that was called in for the toe

## 2014-06-29 NOTE — Telephone Encounter (Signed)
Brandon from [facility] calling to discuss some symptoms going on with mr Donaldson  Please call them at [phone]

## 2014-06-29 NOTE — Patient Instructions (Addendum)
Medication Instructions:  Your physician recommends that you continue on your current medications as directed. Please refer to the Current Medication list given to you today.   Labwork: NONE TODAY   Testing/Procedures: NONE TODAY   Follow-Up:  FOLLOW UP WITH DR COOPER IN ONE MONTH OR NEXT AVAILABLE  Any Other Special Instructions Will Be Listed Below (If Applicable).   MAKE SURE YOU ARE HYDRATING YOUR SELF DAILY WITH DRINK PLENTY OF WATER

## 2014-06-29 NOTE — Assessment & Plan Note (Signed)
Stable without chest pain 

## 2014-06-29 NOTE — Telephone Encounter (Signed)
PA submitted through [email or web address] and pt's daughter aware.

## 2014-07-01 DIAGNOSIS — Z8673 Personal history of transient ischemic attack (TIA), and cerebral infarction without residual deficits: Secondary | ICD-10-CM | POA: Diagnosis not present

## 2014-07-01 DIAGNOSIS — Z9181 History of falling: Secondary | ICD-10-CM | POA: Diagnosis not present

## 2014-07-01 DIAGNOSIS — R269 Unspecified abnormalities of gait and mobility: Secondary | ICD-10-CM | POA: Diagnosis not present

## 2014-07-03 DIAGNOSIS — R269 Unspecified abnormalities of gait and mobility: Secondary | ICD-10-CM | POA: Diagnosis not present

## 2014-07-03 DIAGNOSIS — Z9181 History of falling: Secondary | ICD-10-CM | POA: Diagnosis not present

## 2014-07-03 DIAGNOSIS — Z8673 Personal history of transient ischemic attack (TIA), and cerebral infarction without residual deficits: Secondary | ICD-10-CM | POA: Diagnosis not present

## 2014-07-07 ENCOUNTER — Ambulatory Visit: Payer: Medicare Other | Admitting: Rehabilitative and Restorative Service Providers"

## 2014-07-07 DIAGNOSIS — R3915 Urgency of urination: Secondary | ICD-10-CM | POA: Diagnosis not present

## 2014-07-07 DIAGNOSIS — R339 Retention of urine, unspecified: Secondary | ICD-10-CM | POA: Diagnosis not present

## 2014-07-13 ENCOUNTER — Other Ambulatory Visit: Payer: Self-pay | Admitting: Family Medicine

## 2014-07-14 ENCOUNTER — Telehealth: Payer: Self-pay | Admitting: *Deleted

## 2014-07-14 MED ORDER — DOCUSATE SODIUM 100 MG PO CAPS: 100.0000 mg | ORAL_CAPSULE | Freq: Every day | ORAL | Status: DC

## 2014-07-14 NOTE — Telephone Encounter (Signed)
Medication called/sent to requested pharmacy  

## 2014-07-14 NOTE — Telephone Encounter (Signed)
Pt daughter called needing refill on Colace says it has been a while since last refill and wants to know if can get a refill says he just got home from hospital and no doctor had prescribe it but needs it.  [facility] [phone] - [place], [place] - [address] SEC [address]

## 2014-07-16 ENCOUNTER — Encounter: Payer: Self-pay | Admitting: Physician Assistant

## 2014-07-16 ENCOUNTER — Ambulatory Visit (INDEPENDENT_AMBULATORY_CARE_PROVIDER_SITE_OTHER): Payer: Medicare Other | Admitting: Physician Assistant

## 2014-07-16 VITALS — BP 164/80 | HR 60 | Temp 97.9°F | Resp 18 | Wt 181.0 lb

## 2014-07-16 DIAGNOSIS — R55 Syncope and collapse: Secondary | ICD-10-CM

## 2014-07-16 NOTE — Progress Notes (Signed)
Patient ID: Earnstine Regalaul L Gunnarson MRN: [id number], DOB: [birth year], 81 y.o. Date of Encounter: @DATE @  Chief Complaint:  Chief Complaint  Patient presents with  . reoccuring dizziness    getting worse, daughter concern maybe blood sugars,daughter says Assit. Liv. is giving him his meclizine on a regular schedule not PRN,has been worked up by [facility]    HPI: 81 y.o. year old white male  presents with his daughter for the above evaluation.  She says that she "knows she may just be grasping at straws but is trying to find any explanation she can for the episodes he is experiencing".   Says that in the past when he had another "episode" a fireman checked his blood sugar and said that "blood sugar was low"  but she does not know the number of the reading. Says that he just had another "episode" this past Friday night/ Saturday morning and at that time the nursing staff at his assisted living check blood sugar and got 175. Therefore she is wondering whether blood sugar could be causing his symptoms.  Says that this most recent "episode" occurred on the night of Friday/early morning Saturday at about 1 AM. Says that he always gets up about every hour--says that he constantly stays thirsty so when he wakes up at night he will get himself a drink of water and while he's up will go use the bathroom. Says that with this "episode" he felt as if he was about to pass out and sat down prior to completely passing out or falling.  However in the past he has had episodes of complete syncope and this had history of falls so she just wanted to see if there was anything else to evaluate.   Past Medical History  Diagnosis Date  . PVD (peripheral vascular disease)   . CAD (coronary artery disease)     a. 12/2005 - PCI to OM  . Hyperlipidemia   . Cerebrovascular accident   . DVT femoral (deep venous thrombosis) with thrombophlebitis 03/2007    [name]/notes 07/13/2010  . GERD (gastroesophageal reflux disease)   . Edema     . Anxiety   . Vertigo   . IBS (irritable bowel syndrome)   . Duodenitis   . Gastritis   . Esophageal stricture   . Cellulitis   . Pulmonary embolism   . Prostatic hypertrophy   . Frequent falls   . Subdural hematoma   . PTSD (post-traumatic stress disorder)   . Vertigo   . TIA (transient ischemic attack)     a. 01/2006 and 05/2006.   [name]. Hiatal hernia     periferal neuropathy  . Hx of echocardiogram     Echo 9/13:  Mild LVH, EF 60-65%, Gr 1 diast dysfn, mild AI, mild BAE  . Gout   . Chronic low back pain   . Gait disorder   . Peripheral neuropathy   . Hydrocele   . History of shingles     Left lumbar  . HOH (hard of hearing)     hearing aid  . Myocardial infarction "years ago"    "dr said I'd had a small one"  . Daily headache   . Depression   . Urinary frequency   . Blind left eye      Home Meds: Outpatient Prescriptions Prior to Visit  Medication Sig Dispense Refill  . acetaminophen (TYLENOL) 500 MG tablet Take 2 tablets (1,000 mg total) by mouth 2 (two) times daily. 60 tablet 11  .  ALPRAZolam (XANAX) 0.25 MG tablet Take 1 tablet (0.25 mg total) by mouth 2 (two) times daily as needed for anxiety. 10 tablet 0  . DEXILANT 60 MG capsule TAKE ONE CAPSULE BY MOUTH EVERY DAY 30 capsule 11  . diclofenac sodium (VOLTAREN) 1 % GEL Apply 2 g topically 4 (four) times daily. 100 g 5  . docusate sodium (COLACE) 100 MG capsule Take 1 capsule (100 mg total) by mouth at bedtime. 30 capsule 11  . DULoxetine (CYMBALTA) 30 MG capsule TAKE 1 CAPSULE BY MOUTH DAILY 30 capsule 6  . HYDROcodone-acetaminophen (NORCO) 5-325 MG per tablet Take 1 tablet by mouth every 6 (six) hours as needed for moderate pain or severe pain. 10 tablet 0  . latanoprost (XALATAN) 0.005 % ophthalmic solution Place 1 drop into both eyes at bedtime.    [name] MYRBETRIQ 25 MG TB24 tablet TAKE 1 TABLET BY MOUTH EVERY DAY 30 tablet 11  . nitroGLYCERIN (NITROSTAT) 0.4 MG SL tablet Place 0.4 mg under the tongue every 5 (five)  minutes as needed. Chest pain    . ondansetron (ZOFRAN) 4 MG tablet Take 4 mg by mouth every 8 (eight) hours as needed for nausea.    . polyethylene glycol powder (GLYCOLAX/MIRALAX) powder MIX 17 GRAMS WITH 4 TO 6 OUNCES OF WATER AND DRINK ONCE DAILY AS NEEDED FOR CONSTIPATION 527 g 2  . Rivaroxaban (XARELTO) 15 MG TABS tablet TAKE 1 TABLET BY MOUTH EVERY DAY WITH SUPPER 30 tablet 4  . benzonatate (TESSALON PERLES) 100 MG capsule Take 2 capsules (200 mg total) by mouth 3 (three) times daily as needed for cough. (Patient not taking: Reported on 07/16/2014) 20 capsule 0   No facility-administered medications prior to visit.    Allergies:  Allergies  Allergen Reactions  . Ativan [Lorazepam]   . Augmentin [Amoxicillin-Pot Clavulanate] Nausea And Vomiting    daughter states he can tolerate Amoxicillin ok  . Fludrocortisone Acetate Other (See Comments)    unknown  . Scopace [Scopolamine] Other (See Comments)    *patch Unknown reaction    History   Social History  . Marital Status: Married    Spouse Name: N/A  . Number of Children: N/A  . Years of Education: N/A   Occupational History  . Not on file.   Social History Main Topics  . Smoking status: Never Smoker   . Smokeless tobacco: Never Used  . Alcohol Use: 1.2 oz/week    2 Glasses of wine per week  . Drug Use: No  . Sexual Activity: No   Other Topics Concern  . 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 Colon cancer Neg Hx   . Coronary artery disease Brother   . Heart attack Father   . Migraines Mother   . Breast cancer Sister   . Dementia Sister   . Renal Disease Brother      Review of Systems:  See HPI for pertinent ROS. All other ROS negative.    Physical Exam: Blood pressure 164/80, pulse 60, temperature 97.9 F (36.6 C), temperature source Oral, resp. rate 18, weight 181 lb (82.101 kg)., Body mass  index is 25.97 kg/(m^2). General: Elderly white male . Appears in no acute distress. Neck: Supple. No thyromegaly. No lymphadenopathy. Lungs: Clear bilaterally to auscultation without wheezes, rales, or rhonchi. Breathing is unlabored. Heart: Regular rhythm. II/VI murmur at left sternal  border.No rubs, or gallops. Musculoskeletal:  Strength and tone normal for age. Extremities/Skin: Warm and dry.  No edema.  Neuro: Alert and oriented X 3. Moves all extremities spontaneously. Gait is normal. CNII-XII grossly in tact. Psych:  Responds to questions appropriately with a normal affect.     ASSESSMENT AND PLAN:  81 y.o. year old male with  1. Pre-syncope  Today I reviewed his note with [facility] 02/04/14. Today I also noted the fact that [facility]  Today I reviewed Cardiology note. Specifically, reviewed their last note dated 06/29/14. At that visit they reviewed the fact that he has a history of paroxysmal atrial fibrillation. Because of bradycardia and heart rate of 48 atenolol had been stopped during his hospitalization 05/2014. At the time of the cardiology visit 06/29/2014 patient reported "not feeling well at all. Continuing to have dizziness when he stands up and very unsteady. " At that Cardiology visit orthostatic blood pressures were obtained and he did show significant orthostatic drop with his blood pressure. Cardiology noted that he was continuing to be orthostatic in the office despite being off atenolol. Was felt that he was not drinking properly and was dehydrated.  At that visit cardiology also noted that at the time of that visit he was in normal sinus rhythm. However,  he did report having some palpitations since the atenolol was stopped. Cardiology noted that it was possible that he could be having episodes of PAF. However because of his orthostatic hypotension they were very limited in treatment.  Recommended hydration and no further rate lowering medications at that time.  Also reviewed hospital discharge summary from his hospitalization 05/22/2014 through 05/26/2014.  Also reviewed with the daughter today the glucose readings obtained on all recent labs and the fact that they were all normal.  Reviewed all of this information with the daughter.  Recommend that he continue to drink fluids as much as possible and keep well hydrated and to make sure to stand up very slowly and stay at the side of the bed or chair prior to walking to make sure that he is not feeling lightheaded prior to getting away from something to sit on.   [address]igned, Mary Beth MoyersDixon, [place], [facility] 07/16/2014 5:48 PM

## 2014-07-21 ENCOUNTER — Telehealth: Payer: Self-pay | Admitting: Family Medicine

## 2014-07-21 NOTE — Telephone Encounter (Signed)
Pt is Assisted Living Facilty.  After return from Rehab, they had his Meclizine as TID and not PRN.  They have been giving him this on a regular basis.  The daughter and staff feel this is adding to his Fall Risk.  They want order for this to be discontinued or made PRN for dizziness only as needed.  Please advise?

## 2014-07-21 NOTE — Telephone Encounter (Signed)
[facility] regular scheduled [facility].  Start Meclizine 12.5 mg one tablet every 12 hours only as needed for dizziness.

## 2014-07-21 NOTE — Telephone Encounter (Signed)
DEFINITELY DC and make PRN

## 2014-07-27 ENCOUNTER — Other Ambulatory Visit: Payer: Self-pay | Admitting: Family Medicine

## 2014-08-06 ENCOUNTER — Encounter: Payer: Self-pay | Admitting: Neurology

## 2014-08-06 ENCOUNTER — Ambulatory Visit (INDEPENDENT_AMBULATORY_CARE_PROVIDER_SITE_OTHER): Payer: Medicare Other | Admitting: Neurology

## 2014-08-06 VITALS — BP 164/79 | HR 62 | Ht 72.0 in | Wt 182.6 lb

## 2014-08-06 DIAGNOSIS — R269 Unspecified abnormalities of gait and mobility: Secondary | ICD-10-CM | POA: Diagnosis not present

## 2014-08-06 DIAGNOSIS — I480 Paroxysmal atrial fibrillation: Secondary | ICD-10-CM

## 2014-08-06 DIAGNOSIS — R42 Dizziness and giddiness: Secondary | ICD-10-CM | POA: Diagnosis not present

## 2014-08-06 DIAGNOSIS — R739 Hyperglycemia, unspecified: Secondary | ICD-10-CM | POA: Diagnosis not present

## 2014-08-06 DIAGNOSIS — G609 Hereditary and idiopathic neuropathy, unspecified: Secondary | ICD-10-CM | POA: Diagnosis not present

## 2014-08-06 DIAGNOSIS — R55 Syncope and collapse: Secondary | ICD-10-CM

## 2014-08-06 NOTE — Progress Notes (Signed)
Reason for visit: Dizziness  Jason [name] is an 81 y.o. male  History of present illness:  Jason [name] is a 81 year old right-handed white male with a history of a significant peripheral neuropathy associated with an associated gait disorder. The patient and his chronic dizziness that has been present for a number of years. He does have paroxysmal atrial fibrillation, and a history of cerebrovascular disease. The patient had a syncopal event associated with a fall around 05/22/2014. The patient bumped his head, and he required stitches for a laceration on the forehead. Since that time, the dizziness episodes have been more significant. The patient feels dizzy lying down, sitting, and standing. The patient has had several events of near-syncope where his vision will dim out, but he does not black out. The last such event occurred within the last 2 days prior to this evaluation. The patient denies any overt palpitations of the heart, but his chest feels "funny" during the episodes. The patient has nausea associated with the chronic dizziness. He returns for further evaluation.  Past Medical History  Diagnosis Date  . PVD (peripheral vascular disease)   . CAD (coronary artery disease)     a. 12/2005 - PCI to OM  . Hyperlipidemia   . Cerebrovascular accident   . DVT femoral (deep venous thrombosis) with thrombophlebitis 03/2007    [name]/notes 07/13/2010  . GERD (gastroesophageal reflux disease)   . Edema   . Anxiety   . Vertigo   . IBS (irritable bowel syndrome)   . Duodenitis   . Gastritis   . Esophageal stricture   . Cellulitis   . Pulmonary embolism   . Prostatic hypertrophy   . Frequent falls   . Subdural hematoma   . PTSD (post-traumatic stress disorder)   . Vertigo   . TIA (transient ischemic attack)     a. 01/2006 and 05/2006.   [name]. Hiatal hernia     periferal neuropathy  . Hx of echocardiogram     Echo 9/13:  Mild LVH, EF 60-65%, Gr 1 diast dysfn, mild AI, mild BAE  . Gout   .  Chronic low back pain   . Gait disorder   . Peripheral neuropathy   . Hydrocele   . History of shingles     Left lumbar  . HOH (hard of hearing)     hearing aid  . Myocardial infarction "years ago"    "dr said I'd had a small one"  . Daily headache   . Depression   . Urinary frequency   . Blind left eye     Past Surgical History  Procedure Laterality Date  . Hand surgery Left     "replaced bone in my finger"  . Angioplasty    . Carotid endarterectomy Left   . Coronary angioplasty with stent placement      left circumflex  . Cataract extraction, bilateral    . Tonsillectomy    . Tympanic membrane repair Right 1978    [name]/notes 07/27/2010; "couldn'getting so t couldn't hear; had noise in my ear; didn't correct either  . Hand contracture release Right     palm/notes 07/27/2010  . Esophagogastroduodenoscopy (egd) with esophageal dilation      Family History  Problem Relation Age of Onset  . Colon cancer Neg Hx   . Coronary artery disease Brother   . Heart attack Father   . Migraines Mother   . Breast cancer Sister   . Dementia Sister   . Renal  Disease Brother     Social history:  reports that he has never smoked. He has never used smokeless tobacco. He reports that he drinks alcohol. He reports that he does not use illicit drugs.    Allergies  Allergen Reactions  . Ativan [Lorazepam]   . Augmentin [Amoxicillin-Pot Clavulanate] Nausea And Vomiting    daughter states he can tolerate Amoxicillin ok  . Fludrocortisone Acetate Other (See Comments)    unknown  . Scopace [Scopolamine] Other (See Comments)    *patch Unknown reaction    Medications:  Prior to Admission medications   Medication Sig Start Date End Date Taking? Authorizing Provider  acetaminophen (TYLENOL) 500 MG tablet Take 2 tablets (1,000 mg total) by mouth 2 (two) times daily. 01/13/14  Yes Donita Brooks, MD  ALPRAZolam Prudy Feeler) 0.25 MG tablet Take 1 tablet (0.25 mg total) by mouth 2 (two) times daily  as needed for anxiety. 05/26/14  Yes Elease Etienne, MD  DEXILANT 60 MG capsule TAKE ONE CAPSULE BY MOUTH EVERY DAY 03/16/14  Yes Donita Brooks, MD  diclofenac sodium (VOLTAREN) 1 % GEL Apply 2 g topically 4 (four) times daily. 06/22/14  Yes Donita Brooks, MD  docusate sodium (COLACE) 100 MG capsule Take 1 capsule (100 mg total) by mouth at bedtime. 07/14/14  Yes Donita Brooks, MD  DULoxetine (CYMBALTA) 30 MG capsule TAKE 1 CAPSULE BY MOUTH DAILY 02/19/14  Yes York Spaniel, MD  HYDROcodone-acetaminophen (NORCO) 5-325 MG per tablet Take 1 tablet by mouth every 6 (six) hours as needed for moderate pain or severe pain. 05/26/14  Yes Elease Etienne, MD  latanoprost (XALATAN) 0.005 % ophthalmic solution Place 1 drop into both eyes at bedtime.   Yes Historical Provider, MD  meclizine (ANTIVERT) 12.5 MG tablet Take 12.5 mg by mouth every 12 (twelve) hours as needed for dizziness.   Yes Historical Provider, MD  MYRBETRIQ 25 MG TB24 tablet TAKE 1 TABLET BY MOUTH EVERY DAY 07/13/14  Yes Donita Brooks, MD  nitroGLYCERIN (NITROSTAT) 0.4 MG SL tablet Place 0.4 mg under the tongue every 5 (five) minutes as needed. Chest pain   Yes Historical Provider, MD  ondansetron (ZOFRAN) 4 MG tablet Take 4 mg by mouth every 8 (eight) hours as needed for nausea.   Yes Historical Provider, MD  polyethylene glycol powder (GLYCOLAX/MIRALAX) powder MIX 17 GRAMS WITH 4 TO 6 OUNCES OF WATER AND DRINK ONCE DAILY AS NEEDED FOR CONSTIPATION 03/07/13  Yes Donita Brooks, MD  promethazine (PHENERGAN) 25 MG tablet TAKE 1 TABLET BY MOUTH EVERY 6 HOURS AS NEEDED FOR NAUSEA 07/27/14  Yes Donita Brooks, MD  Rivaroxaban (XARELTO) 15 MG TABS tablet TAKE 1 TABLET BY MOUTH EVERY DAY WITH SUPPER 03/18/14  Yes Tonny Bollman, MD    ROS:  Out of a complete 14 system review of symptoms, the patient complains only of the following symptoms, and all other reviewed systems are negative.  Fatigue Hearing loss, ringing in the ears Eye  itching, eye redness, loss of vision, blurred vision Shortness of breath Constipation, nausea, rectal pain Insomnia, daytime sleepiness, snoring Environmental allergies Difficulty urinating, painful urination Joint pain, back pain, achy muscles, muscle cramps, walking difficulties, neck pain, neck stiffness Bruising easily Dizziness, headache, weakness, passing out  Blood pressure 164/79, pulse 62, height 6' (1.829 m), weight 182 lb 9.6 oz (82.827 kg).   Blood pressure, left arm, sitting is 124/80. Blood pressure, left arm, standing is 138/80.  Physical Exam  General: The  patient is alert and cooperative at the time of the examination.  Skin: No significant peripheral edema is noted.   Neurologic Exam  Mental status: The patient is alert and oriented x 3 at the time of the examination. The patient has apparent normal recent and remote memory, with an apparently normal attention span and concentration ability.   Cranial nerves: Facial symmetry is present. Speech is normal, no aphasia or dysarthria is noted. Extraocular movements are full. Visual fields are full.  Motor: The patient has good strength in all 4 extremities.  Sensory examination: Soft touch sensation is symmetric on the face, arms, and legs, but sensation is significantly decreased below the knees bilaterally.  Coordination: The patient has good finger-nose-finger and heel-to-shin bilaterally.  Gait and station: The patient has a wide-based, unsteady gait. The patient uses a walker for ambulation. Tandem gait was not attempted. No drift is seen.  Reflexes: Deep tendon reflexes are symmetric.   Assessment/Plan:  1. Peripheral neuropathy  2. Gait disturbance  3. Syncope, near syncope  4. Chronic dizziness, vertigo  The patient continues to have his usual chronic dizziness that has worsened since a fall that resulted in some head trauma. The patient had a CT scan of the brain was unremarkable around that  time. The patient has had episodes of near-syncope that occurred on several occasions since the initial blackout event. The patient will be set up for a carotid Doppler study and a prolonged cardiac monitoring study. He will follow-up through this office in 3-4 months.  Marlan Palau MD 08/06/2014 7:21 PM  [facility] [address] [place], [place] [address]  Phone [phone] Fax [phone]

## 2014-08-06 NOTE — Patient Instructions (Signed)
Syncope °Syncope is a medical term for fainting or passing out. This means you lose consciousness and drop to the ground. People are generally unconscious for less than 5 minutes. You may have some muscle twitches for up to 15 seconds before waking up and returning to normal. Syncope occurs more often in older adults, but it can happen to anyone. While most causes of syncope are not dangerous, syncope can be a sign of a serious medical problem. It is important to seek medical care.  °CAUSES  °Syncope is caused by a sudden drop in blood flow to the brain. The specific cause is often not determined. Factors that can bring on syncope include: °· Taking medicines that lower blood pressure. °· Sudden changes in posture, such as standing up quickly. °· Taking more medicine than prescribed. °· Standing in one place for too long. °· Seizure disorders. °· Dehydration and excessive exposure to heat. °· Low blood sugar (hypoglycemia). °· Straining to have a bowel movement. °· Heart disease, irregular heartbeat, or other circulatory problems. °· Fear, emotional distress, seeing blood, or severe pain. °SYMPTOMS  °Right before fainting, you may: °· Feel dizzy or light-headed. °· Feel nauseous. °· See all white or all black in your field of vision. °· Have cold, clammy skin. °DIAGNOSIS  °Your health care provider will ask about your symptoms, perform a physical exam, and perform an electrocardiogram (ECG) to record the electrical activity of your heart. Your health care provider may also perform other heart or blood tests to determine the cause of your syncope which may include: °· Transthoracic echocardiogram (TTE). During echocardiography, sound waves are used to evaluate how blood flows through your heart. °· Transesophageal echocardiogram (TEE). °· Cardiac monitoring. This allows your health care provider to monitor your heart rate and rhythm in real time. °· Holter monitor. This is a portable device that records your  heartbeat and can help diagnose heart arrhythmias. It allows your health care provider to track your heart activity for several days, if needed. °· Stress tests by exercise or by giving medicine that makes the heart beat faster. °TREATMENT  °In most cases, no treatment is needed. Depending on the cause of your syncope, your health care provider may recommend changing or stopping some of your medicines. °HOME CARE INSTRUCTIONS °· Have someone stay with you until you feel stable. °· Do not drive, use machinery, or play sports until your health care provider says it is okay. °· Keep all follow-up appointments as directed by your health care provider. °· Lie down right away if you start feeling like you might faint. Breathe deeply and steadily. Wait until all the symptoms have passed. °· Drink enough fluids to keep your urine clear or pale yellow. °· If you are taking blood pressure or heart medicine, get up slowly and take several minutes to sit and then stand. This can reduce dizziness. °SEEK IMMEDIATE MEDICAL CARE IF:  °· You have a severe headache. °· You have unusual pain in the chest, abdomen, or back. °· You are bleeding from your mouth or rectum, or you have black or tarry stool. °· You have an irregular or very fast heartbeat. °· You have pain with breathing. °· You have repeated fainting or seizure-like jerking during an episode. °· You faint when sitting or lying down. °· You have confusion. °· You have trouble walking. °· You have severe weakness. °· You have vision problems. °If you fainted, call your local emergency services (911 in [place] Do not drive   yourself to the hospital.  °MAKE SURE YOU: °· Understand these instructions. °· Will watch your condition. °· Will get help right away if you are not doing well or get worse. °Document Released: 02/27/2005 Document Revised: 03/04/2013 Document Reviewed: 04/28/2011 °ExitCare® Patient Information ©2015 [employer]. This information is not intended to replace  advice given to you by your health care provider. Make sure you discuss any questions you have with your health care provider. ° °

## 2014-08-07 ENCOUNTER — Telehealth: Payer: Self-pay

## 2014-08-07 ENCOUNTER — Other Ambulatory Visit: Payer: Self-pay | Admitting: Cardiovascular Disease

## 2014-08-07 LAB — HEMOGLOBIN A1C
Est. average glucose Bld gHb Est-mCnc: 111 mg/dL
Hgb A1c MFr Bld: 5.5 % (ref 4.8–5.6)

## 2014-08-07 NOTE — Telephone Encounter (Signed)
Patients daughter was informed of patients normal lab results.

## 2014-08-18 ENCOUNTER — Ambulatory Visit (INDEPENDENT_AMBULATORY_CARE_PROVIDER_SITE_OTHER): Payer: Medicare Other

## 2014-08-18 DIAGNOSIS — R42 Dizziness and giddiness: Secondary | ICD-10-CM

## 2014-08-18 DIAGNOSIS — R739 Hyperglycemia, unspecified: Secondary | ICD-10-CM | POA: Diagnosis not present

## 2014-08-18 DIAGNOSIS — G609 Hereditary and idiopathic neuropathy, unspecified: Secondary | ICD-10-CM | POA: Diagnosis not present

## 2014-08-18 DIAGNOSIS — I480 Paroxysmal atrial fibrillation: Secondary | ICD-10-CM

## 2014-08-18 DIAGNOSIS — R55 Syncope and collapse: Secondary | ICD-10-CM

## 2014-08-18 DIAGNOSIS — R269 Unspecified abnormalities of gait and mobility: Secondary | ICD-10-CM

## 2014-08-19 ENCOUNTER — Telehealth: Payer: Self-pay

## 2014-08-19 NOTE — Telephone Encounter (Signed)
Called Patient, scheduled US Carotid.  Patient verbalized understanding. 

## 2014-09-02 ENCOUNTER — Ambulatory Visit (INDEPENDENT_AMBULATORY_CARE_PROVIDER_SITE_OTHER): Payer: Medicare Other

## 2014-09-02 ENCOUNTER — Telehealth: Payer: Self-pay | Admitting: Neurology

## 2014-09-02 ENCOUNTER — Other Ambulatory Visit: Payer: Self-pay

## 2014-09-02 DIAGNOSIS — I6522 Occlusion and stenosis of left carotid artery: Secondary | ICD-10-CM

## 2014-09-02 DIAGNOSIS — R269 Unspecified abnormalities of gait and mobility: Secondary | ICD-10-CM

## 2014-09-02 DIAGNOSIS — R739 Hyperglycemia, unspecified: Secondary | ICD-10-CM

## 2014-09-02 DIAGNOSIS — G609 Hereditary and idiopathic neuropathy, unspecified: Secondary | ICD-10-CM

## 2014-09-02 DIAGNOSIS — R55 Syncope and collapse: Secondary | ICD-10-CM

## 2014-09-02 DIAGNOSIS — I480 Paroxysmal atrial fibrillation: Secondary | ICD-10-CM

## 2014-09-02 DIAGNOSIS — R42 Dizziness and giddiness: Secondary | ICD-10-CM

## 2014-09-02 NOTE — Telephone Encounter (Signed)
I called the daughter. The patient has greater than 90% left internal carotid artery stenosis. The patient continues to have brief episodes of dizziness and visual dimming. It is possible that it may be related to this stenosis. In order to make an informed decision about whether or not to do carotid stenting, we will get MRA of the head to look at the circle of Willis. The patient has good cross flow, we may opt for medical therapy only. The patient does have some chronic renal insufficiency, GFR around 42.

## 2014-09-04 ENCOUNTER — Telehealth: Payer: Self-pay | Admitting: Neurology

## 2014-09-04 NOTE — Telephone Encounter (Signed)
I called the daughter. The carotid Doppler study done to the [facility] in February 2013 calls a greater than 70% stenosis of the left internal carotid artery, now it is greater than 90%. There is a reported 50-69% stenosis on the right side. I believe that the left carotid lesion has worsened over time. It is likely that it is causing symptoms currently for the patient. We will check MRA of the head, if there is an incomplete circle of Willis, we may consider a carotid stenting procedure.

## 2014-09-07 ENCOUNTER — Other Ambulatory Visit: Payer: Self-pay | Admitting: Neurology

## 2014-09-09 ENCOUNTER — Telehealth: Payer: Self-pay | Admitting: *Deleted

## 2014-09-09 NOTE — Telephone Encounter (Signed)
Spoke to the POA pts daughter on the phone, she was wondering about the status of the MRI/MRA of the head. I told her that I would have the MRI scheduler contact her. She also asked about the open air MRI and medication for sedation since he wont be able to lay still. I told her that I would let the RN Earney Navykelby know that he needs xanax for the procedure. She thanked me   I reached out to [place] who schedules MRI and she told me she would take care of the xanax  Will reach out to Reynolds Americankelby RN and have her get xanax for this pt

## 2014-09-09 NOTE — Telephone Encounter (Signed)
Okay to give the patient a packet of Xanax or the MRI.

## 2014-09-11 NOTE — Telephone Encounter (Signed)
I spoke to patient's daughter, [name], and let her know Xanax is ready at the front for the patient. I explained the instructions printed on the packet and that he will need a driver. She will pick up the xanax next week. I asked her to bring a photo id.

## 2014-09-18 ENCOUNTER — Encounter: Payer: Self-pay | Admitting: Family Medicine

## 2014-09-18 ENCOUNTER — Ambulatory Visit (INDEPENDENT_AMBULATORY_CARE_PROVIDER_SITE_OTHER): Payer: Medicare Other | Admitting: Family Medicine

## 2014-09-18 VITALS — BP 150/88 | HR 74 | Temp 97.8°F | Resp 18 | Ht 70.0 in | Wt 183.0 lb

## 2014-09-18 DIAGNOSIS — M542 Cervicalgia: Secondary | ICD-10-CM

## 2014-09-18 DIAGNOSIS — M545 Low back pain, unspecified: Secondary | ICD-10-CM

## 2014-09-18 LAB — URINALYSIS, ROUTINE W REFLEX MICROSCOPIC
BILIRUBIN URINE: NEGATIVE
Glucose, UA: NEGATIVE mg/dL
HGB URINE DIPSTICK: NEGATIVE
KETONES UR: NEGATIVE mg/dL
Leukocytes, UA: NEGATIVE
NITRITE: NEGATIVE
Protein, ur: NEGATIVE mg/dL
Specific Gravity, Urine: 1.015 (ref 1.005–1.030)
UROBILINOGEN UA: 0.2 mg/dL (ref 0.0–1.0)
pH: 7 (ref 5.0–8.0)

## 2014-09-18 MED ORDER — PREDNISONE 20 MG PO TABS: ORAL_TABLET | ORAL | Status: DC

## 2014-09-18 NOTE — Progress Notes (Signed)
Subjective:    Patient ID: Jason [name], male    DOB: [birth year], 81 y.o.   MRN: [id number]  HPI Patient reports worsening neck pain and crepitus in his neck. Pain radiates from his neck into his right shoulder and into his left shoulder. He also complains of deep aching pain in both forearms. He denies any injuries to the forearms. He has no pain with range of motion of his elbow or his wrist. There is no mass or swelling or tender palpation areas in his forearms. He does have pain with abduction of both shoulders. He has pain with rotation of the neck. He does complain of occasional numbness and tingling in both arms. He also complains of left-sided lower back pain. Urinalysis today is completely normal and shows no evidence of UTI. He has no CVA tenderness. Past Medical History  Diagnosis Date  . PVD (peripheral vascular disease)   . CAD (coronary artery disease)     a. 12/2005 - PCI to OM  . Hyperlipidemia   . Cerebrovascular accident   . DVT femoral (deep venous thrombosis) with thrombophlebitis 03/2007    [name] 07/13/2010  . GERD (gastroesophageal reflux disease)   . Edema   . Anxiety   . Vertigo   . IBS (irritable bowel syndrome)   . Duodenitis   . Gastritis   . Esophageal stricture   . Cellulitis   . Pulmonary embolism   . Prostatic hypertrophy   . Frequent falls   . Subdural hematoma   . PTSD (post-traumatic stress disorder)   . Vertigo   . TIA (transient ischemic attack)     a. 01/2006 and 05/2006.   [name] Hiatal hernia     periferal neuropathy  . Hx of echocardiogram     Echo 9/13:  Mild LVH, EF 60-65%, Gr 1 diast dysfn, mild AI, mild BAE  . Gout   . Chronic low back pain   . Gait disorder   . Peripheral neuropathy   . Hydrocele   . History of shingles     Left lumbar  . HOH (hard of hearing)     hearing aid  . Myocardial infarction "years ago"    "dr said I'd had a small one"  . Daily headache   . Depression   . Urinary frequency   . Blind left eye    Past  Surgical History  Procedure Laterality Date  . Hand surgery Left     "replaced bone in my finger"  . Angioplasty    . Carotid endarterectomy Left   . Coronary angioplasty with stent placement      left circumflex  . Cataract extraction, bilateral    . Tonsillectomy    . Tympanic membrane repair Right 1978    [name] 07/27/2010; "couldn'getting so t couldn't hear; had noise in my ear; didn't correct either  . Hand contracture release Right     palm/notes 07/27/2010  . Esophagogastroduodenoscopy (egd) with esophageal dilation     Current Outpatient Prescriptions on File Prior to Visit  Medication Sig Dispense Refill  . acetaminophen (TYLENOL) 500 MG tablet Take 2 tablets (1,000 mg total) by mouth 2 (two) times daily. 60 tablet 11  . ALPRAZolam (XANAX) 0.25 MG tablet Take 1 tablet (0.25 mg total) by mouth 2 (two) times daily as needed for anxiety. 10 tablet 0  . DEXILANT 60 MG capsule TAKE ONE CAPSULE BY MOUTH EVERY DAY 30 capsule 11  . diclofenac sodium (VOLTAREN) 1 % GEL Apply 2  g topically 4 (four) times daily. 100 g 5  . docusate sodium (COLACE) 100 MG capsule Take 1 capsule (100 mg total) by mouth at bedtime. 30 capsule 11  . DULoxetine (CYMBALTA) 30 MG capsule TAKE ONE CAPSULE BY MOUTH EVERY DAY 30 capsule 6  . HYDROcodone-acetaminophen (NORCO) 5-325 MG per tablet Take 1 tablet by mouth every 6 (six) hours as needed for moderate pain or severe pain. 10 tablet 0  . latanoprost (XALATAN) 0.005 % ophthalmic solution Place 1 drop into both eyes at bedtime.    . meclizine (ANTIVERT) 12.5 MG tablet Take 12.5 mg by mouth every 12 (twelve) hours as needed for dizziness.    [name]. MYRBETRIQ 25 MG TB24 tablet TAKE 1 TABLET BY MOUTH EVERY DAY 30 tablet 11  . nitroGLYCERIN (NITROSTAT) 0.4 MG SL tablet Place 0.4 mg under the tongue every 5 (five) minutes as needed. Chest pain    . ondansetron (ZOFRAN) 4 MG tablet Take 4 mg by mouth every 8 (eight) hours as needed for nausea.    . polyethylene glycol  powder (GLYCOLAX/MIRALAX) powder MIX 17 GRAMS WITH 4 TO 6 OUNCES OF WATER AND DRINK ONCE DAILY AS NEEDED FOR CONSTIPATION 527 g 2  . promethazine (PHENERGAN) 25 MG tablet TAKE 1 TABLET BY MOUTH EVERY 6 HOURS AS NEEDED FOR NAUSEA 30 tablet 0  . XARELTO 15 MG TABS tablet TAKE 1 TABLET BY MOUTH EVERY DAY WITH SUPPER 30 tablet 0   No current facility-administered medications on file prior to visit.   Allergies  Allergen Reactions  . Ativan [Lorazepam]   . Augmentin [Amoxicillin-Pot Clavulanate] Nausea And Vomiting    daughter states he can tolerate Amoxicillin ok  . Fludrocortisone Acetate Other (See Comments)    unknown  . Scopace [Scopolamine] Other (See Comments)    *patch Unknown reaction   History   Social History  . Marital Status: Married    Spouse Name: N/A  . Number of Children: 2  . Years of Education: HS   Occupational History  . retired    Social History Main Topics  . Smoking status: Never Smoker   . Smokeless tobacco: Never Used  . Alcohol Use: 0.0 oz/week    0 Standard drinks or equivalent per week     Comment: wine occasionally  . Drug Use: No  . Sexual Activity: No   Other Topics Concern  . 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   Patient drinks about 2 cups of caffeine daily.      Review of Systems  All other systems reviewed and are negative.      Objective:   Physical Exam  Cardiovascular: Normal rate, regular rhythm and normal heart sounds.   Pulmonary/Chest: Effort normal and breath sounds normal. No respiratory distress. He has no wheezes. He has no rales.  Musculoskeletal:       Right elbow: Normal.      Left elbow: Normal.       Right wrist: Normal.       Left wrist: Normal.       Cervical back: He exhibits decreased range of motion, tenderness, bony tenderness and pain.       Lumbar back: He exhibits decreased range of motion, tenderness and  pain. He exhibits no bony tenderness.       Right forearm: Normal.       Left forearm: Normal.  Vitals reviewed.         Assessment & Plan:  Left-sided low back pain without sciatica - Plan: Urinalysis, Routine w reflex microscopic (not at [facility])  Neck pain - Plan: predniSONE (DELTASONE) 20 MG tablet  I suspect that the patient may have cervical spinal stenosis causing pain in his neck and radiation of the pain into both arms. At the present time there is no evidence of paralysis. Patient's symptoms are limited to pain and possible cervical radiculopathy. Therefore we are going to treat the patient symptomatically with a prednisone taper pack. If he responds, will try to keep the patient maintained on low-dose prednisone given his advanced age. If the pain is no better, I will proceed with an MRI of the cervical spine. I believe the pain in his lower back is due to degenerative disc disease of the lumbar spine and referred muscular pain. Hopefully this will also responded to prednisone I will recheck the patient Tuesday of next week

## 2014-09-22 ENCOUNTER — Ambulatory Visit (INDEPENDENT_AMBULATORY_CARE_PROVIDER_SITE_OTHER): Payer: Medicare Other | Admitting: Family Medicine

## 2014-09-22 ENCOUNTER — Encounter: Payer: Self-pay | Admitting: Family Medicine

## 2014-09-22 VITALS — BP 160/80 | HR 74 | Temp 97.8°F | Resp 18 | Ht 70.0 in

## 2014-09-22 DIAGNOSIS — G8929 Other chronic pain: Secondary | ICD-10-CM | POA: Diagnosis not present

## 2014-09-22 DIAGNOSIS — I10 Essential (primary) hypertension: Secondary | ICD-10-CM

## 2014-09-22 DIAGNOSIS — M542 Cervicalgia: Secondary | ICD-10-CM | POA: Diagnosis not present

## 2014-09-22 MED ORDER — AMLODIPINE BESYLATE 5 MG PO TABS: 5.0000 mg | ORAL_TABLET | Freq: Every day | ORAL | Status: DC

## 2014-09-22 MED ORDER — PREDNISONE 10 MG PO TABS: 10.0000 mg | ORAL_TABLET | Freq: Every day | ORAL | Status: DC

## 2014-09-22 NOTE — Progress Notes (Signed)
Subjective:    Patient ID: Jason [name], male    DOB: Mar 17, 1919, 81 y.o.   MRN: [id number]  HPI 09/18/14 Patient reports worsening neck pain and crepitus in his neck. Pain radiates from his neck into his right shoulder and into his left shoulder. He also complains of deep aching pain in both forearms. He denies any injuries to the forearms. He has no pain with range of motion of his elbow or his wrist. There is no mass or swelling or tender palpation areas in his forearms. He does have pain with abduction of both shoulders. He has pain with rotation of the neck. He does complain of occasional numbness and tingling in both arms. He also complains of left-sided lower back pain. Urinalysis today is completely normal and shows no evidence of UTI. He has no CVA tenderness.  At that time, my plan was: I suspect that the patient may have cervical spinal stenosis causing pain in his neck and radiation of the pain into both arms. At the present time there is no evidence of paralysis. Patient's symptoms are limited to pain and possible cervical radiculopathy. Therefore we are going to treat the patient symptomatically with a prednisone taper pack. If he responds, will try to keep the patient maintained on low-dose prednisone given his advanced age. If the pain is no better, I will proceed with an MRI of the cervical spine. I believe the pain in his lower back is due to degenerative disc disease of the lumbar spine and referred muscular pain. Hopefully this will also responded to prednisone I will recheck the patient Tuesday of next week  09/22/14 Patient's neck pain and arm pains is much better. He occasionally still has some pain in both arms but it is noticeably better since starting the prednisone Past Medical History  Diagnosis Date  . PVD (peripheral vascular disease)   . CAD (coronary artery disease)     a. 12/2005 - PCI to OM  . Hyperlipidemia   . Cerebrovascular accident   . DVT femoral (deep venous  thrombosis) with thrombophlebitis 03/2007    [name]/notes 07/13/2010  . GERD (gastroesophageal reflux disease)   . Edema   . Anxiety   . Vertigo   . IBS (irritable bowel syndrome)   . Duodenitis   . Gastritis   . Esophageal stricture   . Cellulitis   . Pulmonary embolism   . Prostatic hypertrophy   . Frequent falls   . Subdural hematoma   . PTSD (post-traumatic stress disorder)   . Vertigo   . TIA (transient ischemic attack)     a. 01/2006 and 05/2006.   [name]. Hiatal hernia     periferal neuropathy  . Hx of echocardiogram     Echo 9/13:  Mild LVH, EF 60-65%, Gr 1 diast dysfn, mild AI, mild BAE  . Gout   . Chronic low back pain   . Gait disorder   . Peripheral neuropathy   . Hydrocele   . History of shingles     Left lumbar  . HOH (hard of hearing)     hearing aid  . Myocardial infarction "years ago"    "dr said I'd had a small one"  . Daily headache   . Depression   . Urinary frequency   . Blind left eye    Past Surgical History  Procedure Laterality Date  . Hand surgery Left     "replaced bone in my finger"  . Angioplasty    .  Carotid endarterectomy Left   . Coronary angioplasty with stent placement      left circumflex  . Cataract extraction, bilateral    . Tonsillectomy    . Tympanic membrane repair Right 1978    [name] 07/27/2010; "couldn'getting so t couldn't hear; had noise in my ear; didn't correct either  . Hand contracture release Right     palm/notes 07/27/2010  . Esophagogastroduodenoscopy (egd) with esophageal dilation     Current Outpatient Prescriptions on File Prior to Visit  Medication Sig Dispense Refill  . acetaminophen (TYLENOL) 500 MG tablet Take 2 tablets (1,000 mg total) by mouth 2 (two) times daily. 60 tablet 11  . ALPRAZolam (XANAX) 0.25 MG tablet Take 1 tablet (0.25 mg total) by mouth 2 (two) times daily as needed for anxiety. 10 tablet 0  . DEXILANT 60 MG capsule TAKE ONE CAPSULE BY MOUTH EVERY DAY 30 capsule 11  . diclofenac sodium (VOLTAREN) 1 %  GEL Apply 2 g topically 4 (four) times daily. 100 g 5  . docusate sodium (COLACE) 100 MG capsule Take 1 capsule (100 mg total) by mouth at bedtime. 30 capsule 11  . DULoxetine (CYMBALTA) 30 MG capsule TAKE ONE CAPSULE BY MOUTH EVERY DAY 30 capsule 6  . HYDROcodone-acetaminophen (NORCO) 5-325 MG per tablet Take 1 tablet by mouth every 6 (six) hours as needed for moderate pain or severe pain. 10 tablet 0  . latanoprost (XALATAN) 0.005 % ophthalmic solution Place 1 drop into both eyes at bedtime.    . meclizine (ANTIVERT) 12.5 MG tablet Take 12.5 mg by mouth every 12 (twelve) hours as needed for dizziness.    [name] MYRBETRIQ 25 MG TB24 tablet TAKE 1 TABLET BY MOUTH EVERY DAY 30 tablet 11  . nitroGLYCERIN (NITROSTAT) 0.4 MG SL tablet Place 0.4 mg under the tongue every 5 (five) minutes as needed. Chest pain    . ondansetron (ZOFRAN) 4 MG tablet Take 4 mg by mouth every 8 (eight) hours as needed for nausea.    . polyethylene glycol powder (GLYCOLAX/MIRALAX) powder MIX 17 GRAMS WITH 4 TO 6 OUNCES OF WATER AND DRINK ONCE DAILY AS NEEDED FOR CONSTIPATION 527 g 2  . predniSONE (DELTASONE) 20 MG tablet 3 tabs poqday 1-2, 2 tabs poqday 3-4, 1 tab poqday 5-6 12 tablet 0  . promethazine (PHENERGAN) 25 MG tablet TAKE 1 TABLET BY MOUTH EVERY 6 HOURS AS NEEDED FOR NAUSEA 30 tablet 0  . XARELTO 15 MG TABS tablet TAKE 1 TABLET BY MOUTH EVERY DAY WITH SUPPER 30 tablet 0   No current facility-administered medications on file prior to visit.   Allergies  Allergen Reactions  . Ativan [Lorazepam]   . Augmentin [Amoxicillin-Pot Clavulanate] Nausea And Vomiting    daughter states he can tolerate Amoxicillin ok  . Fludrocortisone Acetate Other (See Comments)    unknown  . Scopace [Scopolamine] Other (See Comments)    *patch Unknown reaction   History   Social History  . Marital Status: Married    Spouse Name: N/A  . Number of Children: 2  . Years of Education: HS   Occupational History  . retired    Social  History Main Topics  . Smoking status: Never Smoker   . Smokeless tobacco: Never Used  . Alcohol Use: 0.0 oz/week    0 Standard drinks or equivalent per week     Comment: wine occasionally  . Drug Use: No  . Sexual Activity: No   Other Topics Concern  . 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   Patient drinks about 2 cups of caffeine daily.      Review of Systems  All other systems reviewed and are negative.      Objective:   Physical Exam  Cardiovascular: Normal rate, regular rhythm and normal heart sounds.   Pulmonary/Chest: Effort normal and breath sounds normal. No respiratory distress. He has no wheezes. He has no rales.  Musculoskeletal:       Right elbow: Normal.      Left elbow: Normal.       Right wrist: Normal.       Left wrist: Normal.       Cervical back: He exhibits decreased range of motion, tenderness, bony tenderness and pain.       Lumbar back: He exhibits decreased range of motion, tenderness and pain. He exhibits no bony tenderness.       Right forearm: Normal.       Left forearm: Normal.  Vitals reviewed.         Assessment & Plan:  Neck pain, chronic - Plan: predniSONE (DELTASONE) 10 MG tablet  Benign essential HTN - Plan: amLODipine (NORVASC) 5 MG tablet given her age and medical comorbidities, we have elected to continue the patient on 10 mg of prednisone every day to try to manage his neck pain and cervical radiculopathy. We discussed and decided against checking an MRI of the neck. I will treat his elevated blood pressure with amlodipine 5 mg a day. If the patient's neck pain worsens, pursue with an MRI of the neck

## 2014-09-23 ENCOUNTER — Telehealth: Payer: Self-pay | Admitting: Neurology

## 2014-09-23 NOTE — Telephone Encounter (Signed)
I called the patient, talk with the daughter. The limited results of the 30 day cardiac monitor shows no evidence of atrial fibrillation. The MRA of the head is to be done next week, I will call them once the results are available.

## 2014-09-28 ENCOUNTER — Ambulatory Visit
Admission: RE | Admit: 2014-09-28 | Discharge: 2014-09-28 | Disposition: A | Discharge status: Home / Self Care | Payer: Medicare Other | Source: Ambulatory Visit | Attending: Neurology | Admitting: Neurology

## 2014-09-28 DIAGNOSIS — I6522 Occlusion and stenosis of left carotid artery: Secondary | ICD-10-CM | POA: Diagnosis not present

## 2014-09-29 ENCOUNTER — Telehealth: Payer: Self-pay | Admitting: Neurology

## 2014-09-29 DIAGNOSIS — I6522 Occlusion and stenosis of left carotid artery: Secondary | ICD-10-CM

## 2014-09-29 NOTE — Telephone Encounter (Signed)
  I called the daughter. The MRA does show the low flow in the left carotid, there are bilateral A1 segments, I do not see good PCOM circulation. The recommendation for any procedure versus surgery versus carotid stenting is not clear. I have discussed this case with Dr. Pearlean BrownieSethi. He has recommended a second opinion to Dr. Fabienne [facility]arles [facility]. I will try to get this set up. The patient himself would be amenable to procedure. He is fearful that if he had a stroke, he would survive the stroke and the vegetative. He is willing to accept the risk of a procedure if this is recommended.  MRA head 09/28/14:  IMPRESSION: This is an abnormal MR angiogram of the intracranial arteries showing the following: 1. Reduced flow throughout the left internal carotid artery most likely reflects high-grade stenosis in the left carotid artery in the neck. 2. Regions of focal stenosis within the left M2, bilateral A1 and right P2 segments

## 2014-10-04 ENCOUNTER — Other Ambulatory Visit: Payer: Self-pay | Admitting: Cardiovascular Disease

## 2014-10-20 ENCOUNTER — Encounter: Payer: Self-pay | Admitting: Vascular Surgery

## 2014-10-21 ENCOUNTER — Telehealth: Payer: Self-pay | Admitting: Physician Assistant

## 2014-10-21 ENCOUNTER — Ambulatory Visit (INDEPENDENT_AMBULATORY_CARE_PROVIDER_SITE_OTHER): Payer: Medicare Other | Admitting: Vascular Surgery

## 2014-10-21 ENCOUNTER — Encounter: Payer: Self-pay | Admitting: Vascular Surgery

## 2014-10-21 VITALS — BP 157/73 | HR 78 | Temp 97.6°F | Resp 18 | Ht 72.0 in | Wt 190.0 lb

## 2014-10-21 DIAGNOSIS — I6522 Occlusion and stenosis of left carotid artery: Secondary | ICD-10-CM | POA: Diagnosis not present

## 2014-10-21 NOTE — Telephone Encounter (Signed)
New message   Jason [name]  Request for surgical clearance:  1. What type of surgery is being performed? Left CEA  2. When is this surgery scheduled?   3. Are there any medications that need to be held prior to surgery and how long?  Xarlelto   4. Name of physician performing surgery? Dr. Tobie Poet  5. What is your office phone and fax number? [phone]   Fax  [phone]

## 2014-10-21 NOTE — Progress Notes (Signed)
[facility] [place] HISTORY AND PHYSICAL   History of Present Illness:  Patient is a 81 y.o. year old male who presents for evaluation of asymptomatic internal carotid artery stenosis. The patient had a recent duplex exam for evaluation of dizziness. This showed a high-grade greater than 80% left internal carotid artery stenosis with velocities of 450/163. Patient also had an MRA of the brain which showed diminished flow of the left internal carotid artery. He denies any localized symptoms of stroke TIA or amaurosis. His prior history of stroke. He is fairly active overall and lives fairly independently in an assisted living. He also recently had a cardiac monitor device placed and there was no evidence of significant arrhythmia as a cause for his dizziness. He does have a history of chronic dizziness in the past but states this had A little bit worse recently. He is on Xarelto. This apparently is for previous history of DVT and pulmonary embolus. The patient does have an IVC filter.  He did have a scar on his right neck consistent with carotid endarterectomy but the patient does not recall the details regarding this.  Other medical problems include coronary artery disease followed by Dr. Excell Seltzer, hyperlipidemia, reflux, peripheral neuropathy, decreased hearing,  Past Medical History  Diagnosis Date  . PVD (peripheral vascular disease)   . CAD (coronary artery disease)     a. 12/2005 - PCI to OM  . Hyperlipidemia   . Cerebrovascular accident   . DVT femoral (deep venous thrombosis) with thrombophlebitis 03/2007    [name] 07/13/2010  . GERD (gastroesophageal reflux disease)   . Edema   . Anxiety   . Vertigo   . IBS (irritable bowel syndrome)   . Duodenitis   . Gastritis   . Esophageal stricture   . Cellulitis   . Pulmonary embolism   . Prostatic hypertrophy   . Frequent falls   . Subdural hematoma   . PTSD (post-traumatic stress disorder)   . Vertigo   . TIA (transient  ischemic attack)     a. 01/2006 and 05/2006.   [name] Hiatal hernia     periferal neuropathy  . Hx of echocardiogram     Echo 9/13:  Mild LVH, EF 60-65%, Gr 1 diast dysfn, mild AI, mild BAE  . Gout   . Chronic low back pain   . Gait disorder   . Peripheral neuropathy   . Hydrocele   . History of shingles     Left lumbar  . HOH (hard of hearing)     hearing aid  . Myocardial infarction "years ago"    "dr said I'd had a small one"  . Daily headache   . Depression   . Urinary frequency   . Blind left eye     Past Surgical History  Procedure Laterality Date  . Hand surgery Left     "replaced bone in my finger"  . Angioplasty    . Carotid endarterectomy Left   . Coronary angioplasty with stent placement      left circumflex  . Cataract extraction, bilateral    . Tonsillectomy    . Tympanic membrane repair Right 1978    [name] 07/27/2010; "couldn'getting so t couldn't hear; had noise in my ear; didn't correct either  . Hand contracture release Right     palm/notes 07/27/2010  . Esophagogastroduodenoscopy (egd) with esophageal dilation      Social History Social History  Substance Use Topics  . Smoking status: Never Smoker   .  Smokeless tobacco: Never Used  . Alcohol Use: 0.0 oz/week    0 Standard drinks or equivalent per week     Comment: wine occasionally    Family History Family History  Problem Relation Age of Onset  . Colon cancer Neg Hx   . Coronary artery disease Brother   . Heart attack Father   . Migraines Mother   . Breast cancer Sister   . Dementia Sister   . Renal Disease Brother     Allergies  Allergies  Allergen Reactions  . Ativan [Lorazepam]   . Augmentin [Amoxicillin-Pot Clavulanate] Nausea And Vomiting    daughter states he can tolerate Amoxicillin ok  . Fludrocortisone Acetate Other (See Comments)    unknown  . Scopace [Scopolamine] Other (See Comments)    *patch Unknown reaction     Current Outpatient Prescriptions  Medication Sig  Dispense Refill  . acetaminophen (TYLENOL) 500 MG tablet Take 2 tablets (1,000 mg total) by mouth 2 (two) times daily. 60 tablet 11  . ALPRAZolam (XANAX) 0.25 MG tablet Take 1 tablet (0.25 mg total) by mouth 2 (two) times daily as needed for anxiety. 10 tablet 0  . amLODipine (NORVASC) 5 MG tablet Take 1 tablet (5 mg total) by mouth daily. 90 tablet 3  . DEXILANT 60 MG capsule TAKE ONE CAPSULE BY MOUTH EVERY DAY 30 capsule 11  . diclofenac sodium (VOLTAREN) 1 % GEL Apply 2 g topically 4 (four) times daily. 100 g 5  . docusate sodium (COLACE) 100 MG capsule Take 1 capsule (100 mg total) by mouth at bedtime. 30 capsule 11  . DULoxetine (CYMBALTA) 30 MG capsule TAKE ONE CAPSULE BY MOUTH EVERY DAY 30 capsule 6  . HYDROcodone-acetaminophen (NORCO) 5-325 MG per tablet Take 1 tablet by mouth every 6 (six) hours as needed for moderate pain or severe pain. 10 tablet 0  . latanoprost (XALATAN) 0.005 % ophthalmic solution Place 1 drop into both eyes at bedtime.    . meclizine (ANTIVERT) 12.5 MG tablet Take 12.5 mg by mouth every 12 (twelve) hours as needed for dizziness.    [name] MYRBETRIQ 25 MG TB24 tablet TAKE 1 TABLET BY MOUTH EVERY DAY 30 tablet 11  . nitroGLYCERIN (NITROSTAT) 0.4 MG SL tablet Place 0.4 mg under the tongue every 5 (five) minutes as needed. Chest pain    . ondansetron (ZOFRAN) 4 MG tablet Take 4 mg by mouth every 8 (eight) hours as needed for nausea.    . polyethylene glycol powder (GLYCOLAX/MIRALAX) powder MIX 17 GRAMS WITH 4 TO 6 OUNCES OF WATER AND DRINK ONCE DAILY AS NEEDED FOR CONSTIPATION 527 g 2  . predniSONE (DELTASONE) 10 MG tablet Take 1 tablet (10 mg total) by mouth daily with breakfast. 30 tablet 5  . promethazine (PHENERGAN) 25 MG tablet TAKE 1 TABLET BY MOUTH EVERY 6 HOURS AS NEEDED FOR NAUSEA 30 tablet 0  . tamsulosin (FLOMAX) 0.4 MG CAPS capsule Take 0.4 mg by mouth daily.    [name] 15 MG TABS tablet TAKE 1 TABLET BY MOUTH EVERY DAY WITH SUPPER 30 tablet 6  . zolpidem  (AMBIEN) 5 MG tablet Take 5 mg by mouth at bedtime as needed for sleep.    . predniSONE (DELTASONE) 20 MG tablet 3 tabs poqday 1-2, 2 tabs poqday 3-4, 1 tab poqday 5-6 (Patient not taking: Reported on 10/21/2014) 12 tablet 0   No current facility-administered medications for this visit.    ROS:   General:  No weight loss, Fever, chills  HEENT: No recent headaches, no nasal bleeding, no visual changes, no sore throat  Neurologic: + dizziness, no blackouts, no seizures. No recent symptoms of stroke or mini- stroke. No recent episodes of slurred speech, or temporary blindness.  Cardiac: No recent episodes of chest pain/pressure, no shortness of breath at rest.  No shortness of breath with exertion.  Denies history of atrial fibrillation or irregular heartbeat  Vascular: No history of rest pain in feet.  No history of claudication.  No history of non-healing ulcer, + history of DVT   Pulmonary: No home oxygen, no productive cough, no hemoptysis,  No asthma or wheezing  Musculoskeletal:   Arthritis,  Low back pain,   Joint pain  Hematologic:No history of hypercoagulable state.  No history of easy bleeding.  No history of anemia  Gastrointestinal: No hematochezia or melena,  No gastroesophageal reflux, no trouble swallowing  Urinary:  chronic Kidney disease,  on HD -  MWF or  TTHS,  Burning with urination,  Frequent urination,  Difficulty urinating;   Skin: No rashes  Psychological: + history of anxiety,  + history of depression   Physical Examination  Filed Vitals:   10/21/14 1320 10/21/14 1327  BP: 179/78 157/73  Pulse: 85 78  Temp: 97.6 F (36.4 C)   Resp: 18   Height: 6' (1.829 m)   Weight: 190 lb (86.183 kg)   SpO2: 95%     Body mass index is 25.76 kg/(m^2).  General:  Alert and oriented, no acute distress HEENT: Normal with exception of very poor hearing Neck: Right-sided carotid bruit, no left carotid bruit, no JVD, well-healed right  neck scar consistent with carotid endarterectomy Pulmonary: Clear to auscultation bilaterally Cardiac: Regular Rate and Rhythm without murmur Abdomen: Soft, non-tender, non-distended, no mass Skin: No rash Extremity Pulses:  2+ radial, brachial, femoral, absent dorsalis pedis, posterior tibial pulses bilaterally Musculoskeletal: No deformity or edema  Neurologic: Upper and lower extremity motor 5/5 and symmetric  DATA:  Carotid duplex scan from [facility] is reviewed today. Right side velocities 175/40, left side 450/163   ASSESSMENT:  Patient with high-grade greater than 80% stenosis currently asymptomatic. Previous remote right carotid endarterectomy with no significant recurrence.   PLAN:  Patient would benefit from left carotid endarterectomy for stroke prophylaxis. I doubt fixing his carotid artery we'll fix his dizziness symptoms is most of these are chronic. However, he certainly is at risk of stroke with the high-grade stenosis on the left side. Risks benefits possible complications and procedure details a carotid endarterectomy were discussed today with the patient and his daughter. These include but are not limited to bleeding infection stroke risk of 1-2% cranial nerve injury wrist tendon 15% bleeding infection both less than 1%. They understand and he certainly wishes to proceed with carotid endarterectomy. However we will have him visit with Dr. Excell Seltzer for preoperative cardiac risk stratification and discussions on whether or not he needs bridging of his Xarelto. I would place him on aspirin for at least a short-term perioperatively. We will schedule him for his carotid endarterectomy after his visit with Dr. Excell Seltzer.  Fabienne Bruns, MD [facility] [place] Office: [phone] Pager: [phone]

## 2014-10-21 NOTE — Telephone Encounter (Signed)
Jason [name], states that she called the office to schedule an appointment for the pt to be seen for cardiac clearance. Message forwarded back to [facility] to call her back.

## 2014-10-22 NOTE — Telephone Encounter (Signed)
I spoke with Judeth Cornfield at [facility] and advised her that Dr. Excell Seltzer is opening up his schedule for Tuesday Aug. 16 morning and that we can add patient for surgical clearance.  Patient was scheduled with Jacolyn Reedy, PA for September and Dr. Darrick Penna requested sooner appointment.  Judeth Cornfield thanked me and asked me to call the patient to set the date and time.   I spoke with patient's daughter, Ardis Rowan, who is with the patient and she requests appointment Tuesday, Aug. 16 at 11:45.

## 2014-10-22 NOTE — Telephone Encounter (Signed)
F/U       Office calling stating that Dr. Darrick Penna states that pt needs a sooner appt within 2 weeks for surgical clearance. There are no sooner appts available w/ Dr. Excell Seltzer or any PA's. Please call back and advise.

## 2014-10-26 ENCOUNTER — Other Ambulatory Visit: Payer: Self-pay

## 2014-10-27 ENCOUNTER — Ambulatory Visit (INDEPENDENT_AMBULATORY_CARE_PROVIDER_SITE_OTHER): Payer: Medicare Other | Admitting: Cardiovascular Disease

## 2014-10-27 VITALS — BP 200/80 | HR 59 | Ht 71.0 in | Wt 191.8 lb

## 2014-10-27 DIAGNOSIS — I10 Essential (primary) hypertension: Secondary | ICD-10-CM

## 2014-10-27 DIAGNOSIS — Z01818 Encounter for other preprocedural examination: Secondary | ICD-10-CM

## 2014-10-27 MED ORDER — AMLODIPINE BESYLATE 10 MG PO TABS: 10.0000 mg | ORAL_TABLET | Freq: Every day | ORAL | Status: DC

## 2014-10-27 NOTE — Patient Instructions (Signed)
Medication Instructions:  Your physician has recommended you make the following change in your medication:  1. INCREASE Amlodipine to  take one by mouth daily 2. Please start holding Xarelto on Thursday (10/29/14) and this will be resumed when okay from surgeon 3. START Aspirin  once a day on Thursday (10/29/14)  Labwork: No new orders.   Testing/Procedures: No new orders.  Follow-Up: Your physician wants you to follow-up in: 6 MONTHS with Dr Excell Seltzer.  You will receive a reminder letter in the mail two months in advance. If you don't receive a letter, please call our office to schedule the follow-up appointment.   Any Other Special Instructions Will Be Listed Below (If Applicable).

## 2014-10-28 ENCOUNTER — Encounter: Payer: Self-pay | Admitting: Cardiovascular Disease

## 2014-10-28 NOTE — Telephone Encounter (Signed)
This encounter was created in error - please disregard.

## 2014-10-28 NOTE — Telephone Encounter (Signed)
New message      Calling to see if pt was seen and cleared for surgery on 10-27-14.  She cannot find the note in epic

## 2014-10-29 ENCOUNTER — Encounter (HOSPITAL_COMMUNITY): Payer: Self-pay

## 2014-10-29 ENCOUNTER — Encounter (HOSPITAL_COMMUNITY)
Admission: RE | Admit: 2014-10-29 | Discharge: 2014-10-29 | Disposition: A | Discharge status: Home / Self Care | Payer: Medicare Other | Source: Ambulatory Visit | Attending: Vascular Surgery | Admitting: Vascular Surgery

## 2014-10-29 ENCOUNTER — Encounter: Payer: Self-pay | Admitting: Cardiovascular Disease

## 2014-10-29 DIAGNOSIS — Z86711 Personal history of pulmonary embolism: Secondary | ICD-10-CM | POA: Diagnosis not present

## 2014-10-29 DIAGNOSIS — K219 Gastro-esophageal reflux disease without esophagitis: Secondary | ICD-10-CM | POA: Diagnosis not present

## 2014-10-29 DIAGNOSIS — Z86718 Personal history of other venous thrombosis and embolism: Secondary | ICD-10-CM | POA: Diagnosis not present

## 2014-10-29 DIAGNOSIS — I10 Essential (primary) hypertension: Secondary | ICD-10-CM | POA: Diagnosis not present

## 2014-10-29 DIAGNOSIS — Z0183 Encounter for blood typing: Secondary | ICD-10-CM | POA: Diagnosis not present

## 2014-10-29 DIAGNOSIS — I2510 Atherosclerotic heart disease of native coronary artery without angina pectoris: Secondary | ICD-10-CM | POA: Diagnosis not present

## 2014-10-29 DIAGNOSIS — G629 Polyneuropathy, unspecified: Secondary | ICD-10-CM | POA: Insufficient documentation

## 2014-10-29 DIAGNOSIS — Z01812 Encounter for preprocedural laboratory examination: Secondary | ICD-10-CM | POA: Diagnosis not present

## 2014-10-29 DIAGNOSIS — I6522 Occlusion and stenosis of left carotid artery: Secondary | ICD-10-CM | POA: Insufficient documentation

## 2014-10-29 DIAGNOSIS — Z01818 Encounter for other preprocedural examination: Secondary | ICD-10-CM | POA: Insufficient documentation

## 2014-10-29 DIAGNOSIS — I739 Peripheral vascular disease, unspecified: Secondary | ICD-10-CM | POA: Insufficient documentation

## 2014-10-29 DIAGNOSIS — I480 Paroxysmal atrial fibrillation: Secondary | ICD-10-CM | POA: Insufficient documentation

## 2014-10-29 DIAGNOSIS — H9190 Unspecified hearing loss, unspecified ear: Secondary | ICD-10-CM | POA: Insufficient documentation

## 2014-10-29 DIAGNOSIS — Z8673 Personal history of transient ischemic attack (TIA), and cerebral infarction without residual deficits: Secondary | ICD-10-CM | POA: Diagnosis not present

## 2014-10-29 DIAGNOSIS — Z7902 Long term (current) use of antithrombotics/antiplatelets: Secondary | ICD-10-CM | POA: Insufficient documentation

## 2014-10-29 HISTORY — DX: Unspecified osteoarthritis, unspecified site: M19.90

## 2014-10-29 HISTORY — DX: Cardiac arrhythmia, unspecified: I49.9

## 2014-10-29 HISTORY — DX: Essential (primary) hypertension: I10

## 2014-10-29 LAB — SURGICAL PCR SCREEN
MRSA, PCR: POSITIVE — AB
STAPHYLOCOCCUS AUREUS: POSITIVE — AB

## 2014-10-29 LAB — COMPREHENSIVE METABOLIC PANEL
ALBUMIN: 4.1 g/dL (ref 3.5–5.0)
ALT: 18 U/L (ref 17–63)
ANION GAP: 8 (ref 5–15)
AST: 25 U/L (ref 15–41)
Alkaline Phosphatase: 56 U/L (ref 38–126)
BUN: 30 mg/dL — ABNORMAL HIGH (ref 6–20)
CHLORIDE: 106 mmol/L (ref 101–111)
CO2: 24 mmol/L (ref 22–32)
Calcium: 9.3 mg/dL (ref 8.9–10.3)
Creatinine, Ser: 1.4 mg/dL — ABNORMAL HIGH (ref 0.61–1.24)
GFR calc Af Amer: 48 mL/min — ABNORMAL LOW (ref >60)
GFR calc non Af Amer: 42 mL/min — ABNORMAL LOW (ref >60)
GLUCOSE: 117 mg/dL — AB (ref 65–99)
POTASSIUM: 4.5 mmol/L (ref 3.5–5.1)
SODIUM: 138 mmol/L (ref 135–145)
TOTAL PROTEIN: 7.4 g/dL (ref 6.5–8.1)
Total Bilirubin: 0.8 mg/dL (ref 0.3–1.2)

## 2014-10-29 LAB — TYPE AND SCREEN
ABO/RH(D): O NEG
ANTIBODY SCREEN: NEGATIVE

## 2014-10-29 LAB — URINALYSIS, ROUTINE W REFLEX MICROSCOPIC
BILIRUBIN URINE: NEGATIVE
Glucose, UA: NEGATIVE mg/dL
HGB URINE DIPSTICK: NEGATIVE
KETONES UR: NEGATIVE mg/dL
Leukocytes, UA: NEGATIVE
Nitrite: NEGATIVE
PROTEIN: NEGATIVE mg/dL
Specific Gravity, Urine: 1.012 (ref 1.005–1.030)
Urobilinogen, UA: 0.2 mg/dL (ref 0.0–1.0)
pH: 7 (ref 5.0–8.0)

## 2014-10-29 LAB — CBC
HEMATOCRIT: 44.4 % (ref 39.0–52.0)
Hemoglobin: 15.1 g/dL (ref 13.0–17.0)
MCH: 32.9 pg (ref 26.0–34.0)
MCHC: 34 g/dL (ref 30.0–36.0)
MCV: 96.7 fL (ref 78.0–100.0)
PLATELETS: 177 10*3/uL (ref 150–400)
RBC: 4.59 MIL/uL (ref 4.22–5.81)
RDW: 14.7 % (ref 11.5–15.5)
WBC: 7.4 10*3/uL (ref 4.0–10.5)

## 2014-10-29 LAB — PROTIME-INR
INR: 1.06 (ref 0.00–1.49)
Prothrombin Time: 14 seconds (ref 11.6–15.2)

## 2014-10-29 LAB — APTT: APTT: 28 s (ref 24–37)

## 2014-10-29 NOTE — Progress Notes (Signed)
PharmJeremy Johann. Into see pt. For med. List

## 2014-10-29 NOTE — Pre-Procedure Instructions (Signed)
Jason [name]  10/29/2014      [facility] [address] [place], [place] - [address] AT [facility] OF [address] & [address] [place] [address] Phone: [phone] Fax: [phone]  [facility] [address] - [place], [place] - [address] AT [facility] OF [address] [place] [address] Phone: [phone] Fax: [phone]  [facility] - [place], [place] - [address] [place] [address] Phone: [phone] Fax: [phone]    Your procedure is scheduled on 11/02/2014  Report to [facility] Admitting at 8:45 A.M.  Call this number if you have problems the morning of surgery:  [phone]   Remember:  Do not eat food or drink liquids after midnight.  On Sunday  Take these medicines the morning of surgery with A SIP OF WATER : Amlodipine, Dexilant, Cymbalta & Flomax               Its OK to take XANAX & HYDROCODONE the morning of surgery if needed   Do not wear jewelry   Do not wear lotions, powders, or perfumes.  You may wear deodorant.    Men may shave face and neck.   Do not bring valuables to the hospital.   [facility] is not responsible for any belongings or valuables.  Contacts, dentures or bridgework may not be worn into surgery.  Leave your suitcase in the car.  After surgery it may be brought to your room.  For patients admitted to the hospital, discharge time will be determined by your treatment team.  Patients discharged the day of surgery will not be allowed to drive home.   Name and phone number of your driver:   With daughter   Special instructions:  Special Instructions: [place] - Preparing for Surgery  Before surgery, you can play an important role.  Because skin is not sterile, your skin needs to be as free of germs as possible.  You can reduce the number of germs on you skin by washing with CHG (chlorahexidine gluconate) soap before surgery.  CHG  is an antiseptic cleaner which kills germs and bonds with the skin to continue killing germs even after washing.  Please DO NOT use if you have an allergy to CHG or antibacterial soaps.  If your skin becomes reddened/irritated stop using the CHG and inform your nurse when you arrive at Short Stay.  Do not shave (including legs and underarms) for at least 48 hours prior to the first CHG shower.  You may shave your face.  Please follow these instructions carefully:   1.  Shower with CHG Soap the night before surgery and the  morning of Surgery.  2.  If you choose to wash your hair, wash your hair first as usual with your  normal shampoo.  3.  After you shampoo, rinse your hair and body thoroughly to remove the  Shampoo.  4.  Use CHG as you would any other liquid soap.  You can apply chg directly to the skin and wash gently with scrungie or a clean washcloth.  5.  Apply the CHG Soap to your body ONLY FROM THE NECK DOWN.    Do not use on open wounds or open sores.  Avoid contact with your eyes, ears, mouth and genitals (private parts).  Wash genitals (private parts)   with your normal soap.  6.  Wash thoroughly, paying special attention to the area where your surgery will be performed.  7.  Thoroughly rinse your body with warm water from the neck down.  8.  DO NOT shower/wash with your normal soap after using and rinsing off   the CHG Soap.  9.  Pat yourself dry with a clean towel.            10.  Wear clean pajamas.            11.  Place clean sheets on your bed the night of your first shower and do not sleep with pets.  Day of Surgery  Do not apply any lotions/deodorants the morning of surgery.  Please wear clean clothes to the hospital/surgery center.  Please read over the following fact sheets that you were given. Pain Booklet, Coughing and Deep Breathing, Blood Transfusion Information, MRSA Information and Surgical Site Infection Prevention

## 2014-10-29 NOTE — Progress Notes (Signed)
Cardiology Office Note Date:  10/29/2014   ID:  [name], DOB September 10, 1919, MRN [id number]  PCP:  Jason Grosser, MD  Cardiologist:  Jason Bollman, MD    Chief Complaint  Patient presents with  . Dizziness   History of Present Illness: Jason [name] is a 81 y.o. male who presents for preoperative evaluation. He has developed severe (>80%) carotid stenosis and is scheduled for CEA pending clearance. This was diagnosed based on carotid duplex criteria during evaluation of dizziness. Medical problems include paroxysmal atrial fibrillation, recurrent DVT's s/p IVC filter placement, and stable CAD without angina. He is chronically anticoagulated with Xarelto. He's had problems with orthostatic hypotension when taking antihypertensive medications.   He otherwise is in his normal state of health and has no new complaints today. Denies dyspnea or chest pain. Complains of chronic leg swelling.  Past Medical History  Diagnosis Date  . PVD (peripheral vascular disease)   . CAD (coronary artery disease)     a. 12/2005 - PCI to OM  . Hyperlipidemia   . Cerebrovascular accident   . DVT femoral (deep venous thrombosis) with thrombophlebitis 03/2007    Jason [name] 07/13/2010  . GERD (gastroesophageal reflux disease)   . Edema   . Anxiety   . Vertigo   . IBS (irritable bowel syndrome)   . Duodenitis   . Gastritis   . Esophageal stricture   . Cellulitis   . Pulmonary embolism   . Prostatic hypertrophy   . Frequent falls   . Subdural hematoma   . PTSD (post-traumatic stress disorder)   . Vertigo   . TIA (transient ischemic attack)     a. 01/2006 and 05/2006.   [name] Hiatal hernia     periferal neuropathy  . Hx of echocardiogram     Echo 9/13:  Mild LVH, EF 60-65%, Gr 1 diast dysfn, mild AI, mild BAE  . Gout   . Chronic low back pain   . Gait disorder   . Peripheral neuropathy   . Hydrocele   . History of shingles     Left lumbar  . HOH (hard of hearing)     hearing aid, totally in R  ear ( no hearing aid in L )   . Myocardial infarction "years ago"    "dr said I'd had a small one"  . Daily headache   . Depression   . Urinary frequency   . Blind left eye   . Hypertension   . Dysrhythmia   . Arthritis     Past Surgical History  Procedure Laterality Date  . Hand surgery Right     "replaced bone in my finger"  . Angioplasty    . Carotid endarterectomy Left   . Coronary angioplasty with stent placement      left circumflex  . Cataract extraction, bilateral    . Tonsillectomy    . Tympanic membrane repair Right 1978    Jason [name] 07/27/2010; "couldn'getting so t couldn't hear; had noise in my ear; didn't correct either  . Hand contracture release Right     palm/notes 07/27/2010  . Esophagogastroduodenoscopy (egd) with esophageal dilation    . Insertion of vena cava filter  2013    h/o PE    Current Outpatient Prescriptions  Medication Sig Dispense Refill  . acetaminophen (TYLENOL) 500 MG tablet Take 2 tablets (1,000 mg total) by mouth 2 (two) times daily. 60 tablet 11  . ALPRAZolam (XANAX) 0.25 MG tablet Take 1 tablet (0.25 mg total)  by mouth 2 (two) times daily as needed for anxiety. 10 tablet 0  . amLODipine (NORVASC) 10 MG tablet Take 1 tablet (10 mg total) by mouth daily. 90 tablet 3  . DEXILANT 60 MG capsule TAKE ONE CAPSULE BY MOUTH EVERY DAY 30 capsule 11  . diclofenac sodium (VOLTAREN) 1 % GEL Apply 2 g topically 4 (four) times daily. 100 g 5  . docusate sodium (COLACE) 100 MG capsule Take 1 capsule (100 mg total) by mouth at bedtime. 30 capsule 11  . DULoxetine (CYMBALTA) 30 MG capsule TAKE ONE CAPSULE BY MOUTH EVERY DAY 30 capsule 6  . HYDROcodone-acetaminophen (NORCO) 5-325 MG per tablet Take 1 tablet by mouth every 6 (six) hours as needed for moderate pain or severe pain. 10 tablet 0  . latanoprost (XALATAN) 0.005 % ophthalmic solution Place 1 drop into both eyes at bedtime.    . meclizine (ANTIVERT) 12.5 MG tablet Take 12.5 mg by mouth every 12 (twelve)  hours as needed for dizziness.    [name] MYRBETRIQ 25 MG TB24 tablet TAKE 1 TABLET BY MOUTH EVERY DAY 30 tablet 11  . nitroGLYCERIN (NITROSTAT) 0.4 MG SL tablet Place 0.4 mg under the tongue every 5 (five) minutes as needed. Chest pain    . ondansetron (ZOFRAN) 4 MG tablet Take 4 mg by mouth every 8 (eight) hours as needed for nausea.    . polyethylene glycol powder (GLYCOLAX/MIRALAX) powder MIX 17 GRAMS WITH 4 TO 6 OUNCES OF WATER AND DRINK ONCE DAILY AS NEEDED FOR CONSTIPATION 527 g 2  . predniSONE (DELTASONE) 10 MG tablet Take 1 tablet (10 mg total) by mouth daily with breakfast. 30 tablet 5  . promethazine (PHENERGAN) 25 MG tablet TAKE 1 TABLET BY MOUTH EVERY 6 HOURS AS NEEDED FOR NAUSEA 30 tablet 0  . tamsulosin (FLOMAX) 0.4 MG CAPS capsule Take 0.4 mg by mouth daily.    Jason [name] 15 MG TABS tablet TAKE 1 TABLET BY MOUTH EVERY DAY WITH SUPPER 30 tablet 6  . zolpidem (AMBIEN) 5 MG tablet Take 5 mg by mouth at bedtime as needed for sleep.    . Carboxymethylcellul-Glycerin (CLEAR EYES FOR DRY EYES OP) Apply 1 drop to eye daily. Mid-day     No current facility-administered medications for this visit.    Allergies:   Ativan; Augmentin; Fludrocortisone acetate; and Scopace   Social History:  The patient  reports that he has never smoked. He has never used smokeless tobacco. He reports that he drinks alcohol. He reports that he does not use illicit drugs.   Family History:  The patient's  family history includes Breast cancer in his sister; Coronary artery disease in his brother; Dementia in his sister; Heart attack in his father; Migraines in his mother; Renal Disease in his brother. There is no history of Colon cancer.    ROS:  Please see the history of present illness.  Otherwise, review of systems is positive for hearing loss, visual disturbance, back pain, muscle pain, dizziness, easy bruising, anxiety, balance problems.  All other systems are reviewed and negative.    PHYSICAL EXAM: VS:  BP  200/80 mmHg  Pulse 59  Ht 5\' 11"  (1.803 m)  Wt 191 lb 12.8 oz (87 kg)  BMI 26.76 kg/m2 , BMI Body mass index is 26.76 kg/(m^2). GEN: Well nourished, well developed, in no acute distress HEENT: normal Neck: no JVD, no masses. Bilateral carotid bruits Cardiac: RRR without murmur or gallop  Respiratory:  clear to auscultation bilaterally, normal work of breathing GI: soft, nontender, nondistended, + BS MS: no deformity or atrophy Ext: stasis dermatitis, trace pretibial edema bilaterally Skin: warm and dry, no rash Neuro:  Strength and sensation are intact Psych: euthymic mood, full affect  EKG:  EKG is not ordered today. The ekg ordered today shows NSR 59 bpm, RBBB, no significant change from previous tracings  Recent Labs: 01/13/2014: TSH 4.174 05/22/2014: Magnesium 1.9 10/29/2014: ALT 18; BUN 30*; Creatinine, Ser 1.40*; Hemoglobin 15.1; Platelets 177; Potassium 4.5; Sodium 138   Lipid Panel     Component Value Date/Time   CHOL 211* 01/18/2007 0846   TRIG 81 01/18/2007 0846   TRIG 118 02/26/2006 1112   HDL 37.0* 01/18/2007 0846   CHOLHDL 5.7 CALC 01/18/2007 0846   CHOLHDL 5.6 CALC 02/26/2006 1112   VLDL 16 01/18/2007 0846   LDLDIRECT 155.4 01/18/2007 0846   LDLDIRECT 153.9 02/26/2006 1112      Wt Readings from Last 3 Encounters:  10/29/14 192 lb 9.6 oz (87.363 kg)  10/27/14 191 lb 12.8 oz (87 kg)  10/21/14 190 lb (86.183 kg)     Cardiac Studies Reviewed: 2D Echo: Study Conclusions  - Left ventricle: The cavity size was normal. Wall thickness was increased in a pattern of mild LVH. Systolic function was normal. The estimated ejection fraction was in the range of 50% to 55%. Wall motion was normal; there were no regional wall motion abnormalities. - Aortic valve: There was mild regurgitation. - Mitral valve: There was mild regurgitation. - Left atrium: The atrium was moderately dilated. - Pulmonary arteries: Systolic pressure was mildly  increased. PA peak pressure: 41 mm Hg (S).  ASSESSMENT AND PLAN: 1.  CAD, native vessel, without symptoms of angina: appears to have stable CAD and at reasonable risk to proceed with carotid endarterectomy.  Overall I would assess his risk as moderate based primarily on advanced age. I don't think preoperative cardiovascular risk stratification is indicated.  2. HTN, uncontrolled: problems in past with orthostasis. BP on my recheck is 188/85. Recommend increase amlodipine to 10 mg daily.  3. Paroxysmal atrial fibrillation: instructed to hold Xarelto for surgery. Advised to start ASA 81 mg daily while off of Xarelto in perioperative period. Maintaining normal sinus rhythm.   Current medicines are reviewed with the patient today.  The patient does not have concerns regarding medicines.  Labs/ tests ordered today include:   Orders Placed This Encounter  Procedures  . EKG 12-Lead    Disposition:   FU 6 months  Signed, Jason Bollman, MD  10/29/2014 10:18 PM    [facility] [address] [place], [place], [place]  [address] Phone: [phone]; Fax: [phone]

## 2014-10-29 NOTE — Progress Notes (Signed)
Pt. & his daughter is aware & agree to last dose of Xarelto today, will hold thereafter with the addition of asa 81 mg. Daily until day of surgery per Dr. Excell Seltzer.

## 2014-10-30 ENCOUNTER — Other Ambulatory Visit: Payer: Self-pay | Admitting: *Deleted

## 2014-10-30 DIAGNOSIS — Z22322 Carrier or suspected carrier of Methicillin resistant Staphylococcus aureus: Secondary | ICD-10-CM

## 2014-10-30 MED ORDER — MUPIROCIN 2 % EX OINT: TOPICAL_OINTMENT | CUTANEOUS | Status: DC

## 2014-10-30 NOTE — Progress Notes (Signed)
Mr Jason [name] daughter, Jason [name] called to ask for a written Prescription to give [address], [place].  Mrs Jason [name] picked Mupirocin up from pharmacy and took to [facility], but they need a written order to administrate medication. I called Purnell Shoemaker, at Dr Darrick Penna office and asked her to fax a prescription to [facility].  I later called and confirmed that the [facility] did receive prescription.

## 2014-10-30 NOTE — Progress Notes (Signed)
Anesthesia Chart Review: Patient is a 81 year old male scheduled for left CEA on 11/02/14 by Dr. Darrick Penna.  History includes non-smoker, PVD, CAD s/p BMS OM2 '07, recurrent DVT/PE s/p IVC filter, PAF, GERD, TIA/CVA ~ '07/'08, anxiety, vertigo, IBS, SDH, PTSD, hiatal hernia, peripheral neuropathy, hard of hearing, depression, blind in left eye, HTN, right carotid endarterectomy.  He resides at [facility]. PCP is Dr. Lynnea Ferrier. Neurologist is Dr. Anne Hahn. Cardiologist is Dr. Tonny Bollman. Patient was seen yesterday, and his note states:  1. CAD, native vessel, without symptoms of angina: appears to have stable CAD and at reasonable risk to proceed with carotid endarterectomy. Overall I would assess his risk as moderate based primarily on advanced age. I don't think preoperative cardiovascular risk stratification is indicated. He had already been instructed to stop Xarelto three days prior to surgery. Dr. Excell Seltzer advised to start ASA 81 mg while off Xarelto in the perioperative period.  06/29/14 NSR: SR with premature supraventricular complexes, right BBB.   05/23/14 Echo: Study Conclusions - Left ventricle: The cavity size was normal. Wall thickness was increased in a pattern of mild LVH. Systolic function was normal. The estimated ejection fraction was in the range of 50% to 55%. Wall motion was normal; there were no regional wall motion abnormalities. - Aortic valve: There was mild regurgitation. - Mitral valve: There was mild regurgitation. - Left atrium: The atrium was moderately dilated. - Pulmonary arteries: Systolic pressure was mildly increased. PA peak pressure: 41 mm Hg (S).  08/2014 30 day Holter Monitor: AHR 64 bpm. Tachycardiac < 1 % of readable data. Bradycardia 38% of readable data. No pauses of 3 or more seconds. AF 0%, non-AF 100%.   No recent stress test found.   01/09/06 Cardiac cath: ASSESSMENT: 1. Three-vessel coronary artery  disease.  a. Nonobstructive LAD disease and high-grade first diagonal lesion.         The first diagonal is very small.  b. Moderate right coronary artery disease.  c. Severe left circumflex stenosis in a large second obtuse         marginal branch. 2. Normal left ventricular function.  3. Intervention: SMB to OM2.  Preoperative labs noted.   Cardiology evaluation yesterday. He is felt to be moderate risk for surgery by cardiology. If no acute changes then I would anticipate that he could proceed as planned.  Velna Ochs [facility] Stay [facility]/Anesthesiology Phone [phone] 10/30/2014 1:52 PM

## 2014-11-02 ENCOUNTER — Inpatient Hospital Stay (HOSPITAL_COMMUNITY)
Admission: AD | Admit: 2014-11-02 | Discharge: 2014-11-03 | DRG: 039 | Disposition: A | Discharge status: Skilled Nursing Facility | Payer: Medicare Other | Source: Ambulatory Visit | Attending: Vascular Surgery | Admitting: Vascular Surgery

## 2014-11-02 ENCOUNTER — Encounter (HOSPITAL_COMMUNITY): Payer: Self-pay | Admitting: Surgery

## 2014-11-02 ENCOUNTER — Inpatient Hospital Stay (HOSPITAL_COMMUNITY): Payer: Medicare Other | Admitting: Vascular Surgery

## 2014-11-02 ENCOUNTER — Inpatient Hospital Stay (HOSPITAL_COMMUNITY): Payer: Medicare Other | Admitting: Certified Registered"

## 2014-11-02 ENCOUNTER — Encounter (HOSPITAL_COMMUNITY)
Admission: AD | Disposition: A | Discharge status: Skilled Nursing Facility | Payer: Self-pay | Source: Ambulatory Visit | Attending: Vascular Surgery

## 2014-11-02 DIAGNOSIS — K589 Irritable bowel syndrome without diarrhea: Secondary | ICD-10-CM | POA: Diagnosis not present

## 2014-11-02 DIAGNOSIS — I739 Peripheral vascular disease, unspecified: Secondary | ICD-10-CM | POA: Diagnosis not present

## 2014-11-02 DIAGNOSIS — R338 Other retention of urine: Secondary | ICD-10-CM | POA: Diagnosis not present

## 2014-11-02 DIAGNOSIS — K219 Gastro-esophageal reflux disease without esophagitis: Secondary | ICD-10-CM | POA: Diagnosis not present

## 2014-11-02 DIAGNOSIS — H9191 Unspecified hearing loss, right ear: Secondary | ICD-10-CM | POA: Diagnosis present

## 2014-11-02 DIAGNOSIS — G629 Polyneuropathy, unspecified: Secondary | ICD-10-CM | POA: Diagnosis present

## 2014-11-02 DIAGNOSIS — I2510 Atherosclerotic heart disease of native coronary artery without angina pectoris: Secondary | ICD-10-CM | POA: Diagnosis present

## 2014-11-02 DIAGNOSIS — Z79899 Other long term (current) drug therapy: Secondary | ICD-10-CM

## 2014-11-02 DIAGNOSIS — N178 Other acute kidney failure: Secondary | ICD-10-CM | POA: Diagnosis not present

## 2014-11-02 DIAGNOSIS — H5442 Blindness, left eye, normal vision right eye: Secondary | ICD-10-CM | POA: Diagnosis not present

## 2014-11-02 DIAGNOSIS — I6522 Occlusion and stenosis of left carotid artery: Secondary | ICD-10-CM | POA: Diagnosis not present

## 2014-11-02 DIAGNOSIS — E785 Hyperlipidemia, unspecified: Secondary | ICD-10-CM | POA: Diagnosis not present

## 2014-11-02 DIAGNOSIS — I129 Hypertensive chronic kidney disease with stage 1 through stage 4 chronic kidney disease, or unspecified chronic kidney disease: Secondary | ICD-10-CM | POA: Diagnosis not present

## 2014-11-02 DIAGNOSIS — I10 Essential (primary) hypertension: Secondary | ICD-10-CM | POA: Diagnosis not present

## 2014-11-02 DIAGNOSIS — Z7901 Long term (current) use of anticoagulants: Secondary | ICD-10-CM | POA: Diagnosis not present

## 2014-11-02 DIAGNOSIS — N183 Chronic kidney disease, stage 3 (moderate): Secondary | ICD-10-CM | POA: Diagnosis not present

## 2014-11-02 DIAGNOSIS — R350 Frequency of micturition: Secondary | ICD-10-CM | POA: Diagnosis not present

## 2014-11-02 DIAGNOSIS — N401 Enlarged prostate with lower urinary tract symptoms: Secondary | ICD-10-CM | POA: Diagnosis present

## 2014-11-02 DIAGNOSIS — I6529 Occlusion and stenosis of unspecified carotid artery: Secondary | ICD-10-CM | POA: Diagnosis present

## 2014-11-02 HISTORY — PX: ENDARTERECTOMY: SHX5162

## 2014-11-02 SURGERY — ENDARTERECTOMY, CAROTID
Anesthesia: General | Site: Neck | Laterality: Left

## 2014-11-02 MED ORDER — LABETALOL HCL 5 MG/ML IV SOLN: 10.0000 mg | INTRAVENOUS | Status: DC | PRN

## 2014-11-02 MED ORDER — ONDANSETRON HCL 4 MG/2ML IJ SOLN: 4.0000 mg | Freq: Four times a day (QID) | INTRAMUSCULAR | Status: DC | PRN

## 2014-11-02 MED ORDER — GLYCOPYRROLATE 0.2 MG/ML IJ SOLN
INTRAMUSCULAR | Status: DC | PRN
  Administered 2014-11-02: 0.4 mg via INTRAVENOUS
  Administered 2014-11-02: .2 mg via INTRAVENOUS

## 2014-11-02 MED ORDER — HEPARIN SODIUM (PORCINE) 1000 UNIT/ML IJ SOLN
INTRAMUSCULAR | Status: AC
  Filled 2014-11-02: qty 1

## 2014-11-02 MED ORDER — HEPARIN SODIUM (PORCINE) 1000 UNIT/ML IJ SOLN
INTRAMUSCULAR | Status: AC
Start: 2014-11-02 — End: 2014-11-02
  Filled 2014-11-02: qty 1

## 2014-11-02 MED ORDER — CHLORHEXIDINE GLUCONATE CLOTH 2 % EX PADS: 6.0000 | MEDICATED_PAD | Freq: Once | CUTANEOUS | Status: DC

## 2014-11-02 MED ORDER — NEOSTIGMINE METHYLSULFATE 10 MG/10ML IV SOLN
INTRAVENOUS | Status: AC
  Filled 2014-11-02: qty 1

## 2014-11-02 MED ORDER — PREDNISONE 10 MG PO TABS
10.0000 mg | ORAL_TABLET | Freq: Every day | ORAL | Status: DC
  Administered 2014-11-03: 10 mg via ORAL
  Filled 2014-11-02 (×2): qty 1

## 2014-11-02 MED ORDER — PROTAMINE SULFATE 10 MG/ML IV SOLN
INTRAVENOUS | Status: AC
  Filled 2014-11-02: qty 5

## 2014-11-02 MED ORDER — RIVAROXABAN 15 MG PO TABS
15.0000 mg | ORAL_TABLET | Freq: Every day | ORAL | Status: DC
  Administered 2014-11-03: 15 mg via ORAL
  Filled 2014-11-02: qty 1

## 2014-11-02 MED ORDER — PROPOFOL 10 MG/ML IV BOLUS
INTRAVENOUS | Status: DC | PRN
  Administered 2014-11-02: 130 mg via INTRAVENOUS

## 2014-11-02 MED ORDER — LIDOCAINE HCL (PF) 1 % IJ SOLN
INTRAMUSCULAR | Status: AC
  Filled 2014-11-02: qty 30

## 2014-11-02 MED ORDER — SODIUM CHLORIDE 0.9 % IV SOLN: 500.0000 mL | Freq: Once | INTRAVENOUS | Status: DC | PRN

## 2014-11-02 MED ORDER — MIRABEGRON ER 25 MG PO TB24
25.0000 mg | ORAL_TABLET | Freq: Every day | ORAL | Status: DC
  Administered 2014-11-03: 25 mg via ORAL
  Filled 2014-11-02: qty 1

## 2014-11-02 MED ORDER — POTASSIUM CHLORIDE CRYS ER 20 MEQ PO TBCR: 20.0000 meq | EXTENDED_RELEASE_TABLET | Freq: Every day | ORAL | Status: DC | PRN

## 2014-11-02 MED ORDER — VANCOMYCIN HCL IN DEXTROSE 1-5 GM/200ML-% IV SOLN
1000.0000 mg | INTRAVENOUS | Status: AC
  Administered 2014-11-02: 1000 mg via INTRAVENOUS
  Filled 2014-11-02: qty 200

## 2014-11-02 MED ORDER — DEXTROSE 5 % IV SOLN
10.0000 mg | INTRAVENOUS | Status: DC | PRN
  Administered 2014-11-02: 20 ug/min via INTRAVENOUS

## 2014-11-02 MED ORDER — ONDANSETRON HCL 4 MG/2ML IJ SOLN
INTRAMUSCULAR | Status: AC
  Filled 2014-11-02: qty 4

## 2014-11-02 MED ORDER — MORPHINE SULFATE (PF) 2 MG/ML IV SOLN: 2.0000 mg | INTRAVENOUS | Status: DC | PRN

## 2014-11-02 MED ORDER — ASPIRIN EC 81 MG PO TBEC
81.0000 mg | DELAYED_RELEASE_TABLET | Freq: Every day | ORAL | Status: DC
  Administered 2014-11-03: 81 mg via ORAL
  Filled 2014-11-02: qty 1

## 2014-11-02 MED ORDER — PANTOPRAZOLE SODIUM 40 MG PO TBEC
40.0000 mg | DELAYED_RELEASE_TABLET | Freq: Every day | ORAL | Status: DC
  Administered 2014-11-03: 40 mg via ORAL
  Filled 2014-11-02: qty 1

## 2014-11-02 MED ORDER — ACETAMINOPHEN 325 MG PO TABS: 325.0000 mg | ORAL_TABLET | ORAL | Status: DC | PRN

## 2014-11-02 MED ORDER — FENTANYL CITRATE (PF) 100 MCG/2ML IJ SOLN
INTRAMUSCULAR | Status: AC
  Administered 2014-11-02: 25 ug via INTRAVENOUS
  Filled 2014-11-02: qty 2

## 2014-11-02 MED ORDER — ONDANSETRON HCL 4 MG PO TABS: 4.0000 mg | ORAL_TABLET | Freq: Three times a day (TID) | ORAL | Status: DC | PRN

## 2014-11-02 MED ORDER — HYDROCODONE-ACETAMINOPHEN 5-325 MG PO TABS
1.0000 | ORAL_TABLET | Freq: Four times a day (QID) | ORAL | Status: DC | PRN
  Administered 2014-11-03: 1 via ORAL
  Filled 2014-11-02: qty 1

## 2014-11-02 MED ORDER — LATANOPROST 0.005 % OP SOLN
1.0000 [drp] | Freq: Every day | OPHTHALMIC | Status: DC
  Administered 2014-11-02: 1 [drp] via OPHTHALMIC
  Filled 2014-11-02: qty 2.5

## 2014-11-02 MED ORDER — PROPOFOL 10 MG/ML IV BOLUS
INTRAVENOUS | Status: AC
  Filled 2014-11-02: qty 20

## 2014-11-02 MED ORDER — GLYCOPYRROLATE 0.2 MG/ML IJ SOLN
INTRAMUSCULAR | Status: AC
  Filled 2014-11-02: qty 5

## 2014-11-02 MED ORDER — 0.9 % SODIUM CHLORIDE (POUR BTL) OPTIME
TOPICAL | Status: DC | PRN
  Administered 2014-11-02: 1000 mL

## 2014-11-02 MED ORDER — HYDRALAZINE HCL 20 MG/ML IJ SOLN: 5.0000 mg | INTRAMUSCULAR | Status: DC | PRN

## 2014-11-02 MED ORDER — DULOXETINE HCL 30 MG PO CPEP
30.0000 mg | ORAL_CAPSULE | Freq: Every day | ORAL | Status: DC
  Administered 2014-11-03: 30 mg via ORAL
  Filled 2014-11-02: qty 1

## 2014-11-02 MED ORDER — DOCUSATE SODIUM 100 MG PO CAPS
100.0000 mg | ORAL_CAPSULE | Freq: Every day | ORAL | Status: DC
  Administered 2014-11-02: 100 mg via ORAL
  Filled 2014-11-02: qty 1

## 2014-11-02 MED ORDER — ALPRAZOLAM 0.25 MG PO TABS
ORAL_TABLET | ORAL | Status: AC
  Filled 2014-11-02: qty 1

## 2014-11-02 MED ORDER — SODIUM CHLORIDE 0.9 % IV SOLN
INTRAVENOUS | Status: DC
  Administered 2014-11-02: 22:00:00 via INTRAVENOUS

## 2014-11-02 MED ORDER — LACTATED RINGERS IV SOLN
INTRAVENOUS | Status: DC
  Administered 2014-11-02 (×3): via INTRAVENOUS

## 2014-11-02 MED ORDER — ONDANSETRON HCL 4 MG/2ML IJ SOLN
INTRAMUSCULAR | Status: DC | PRN
  Administered 2014-11-02: 4 mg via INTRAVENOUS

## 2014-11-02 MED ORDER — SODIUM CHLORIDE 0.9 % IV SOLN: INTRAVENOUS | Status: DC

## 2014-11-02 MED ORDER — NEOSTIGMINE METHYLSULFATE 10 MG/10ML IV SOLN
INTRAVENOUS | Status: DC | PRN
  Administered 2014-11-02: 3 mg via INTRAVENOUS

## 2014-11-02 MED ORDER — SODIUM CHLORIDE 0.9 % IV SOLN
0.0125 ug/kg/min | INTRAVENOUS | Status: AC
  Administered 2014-11-02 (×2): 40 ug via INTRAVENOUS
  Administered 2014-11-02: .2 ug/kg/min via INTRAVENOUS
  Filled 2014-11-02: qty 2000

## 2014-11-02 MED ORDER — NITROGLYCERIN 0.2 MG/ML ON CALL CATH LAB
INTRAVENOUS | Status: DC | PRN
  Administered 2014-11-02: 40 ug via INTRAVENOUS

## 2014-11-02 MED ORDER — PROTAMINE SULFATE 10 MG/ML IV SOLN
INTRAVENOUS | Status: DC | PRN
  Administered 2014-11-02: 10 mg via INTRAVENOUS
  Administered 2014-11-02 (×2): 20 mg via INTRAVENOUS
  Administered 2014-11-02: 10 mg via INTRAVENOUS

## 2014-11-02 MED ORDER — PROMETHAZINE HCL 25 MG PO TABS: 25.0000 mg | ORAL_TABLET | Freq: Four times a day (QID) | ORAL | Status: DC | PRN

## 2014-11-02 MED ORDER — OXYCODONE-ACETAMINOPHEN 5-325 MG PO TABS
ORAL_TABLET | ORAL | Status: AC
  Filled 2014-11-02: qty 2

## 2014-11-02 MED ORDER — AMLODIPINE BESYLATE 10 MG PO TABS
10.0000 mg | ORAL_TABLET | Freq: Every day | ORAL | Status: DC
  Administered 2014-11-03: 10 mg via ORAL
  Filled 2014-11-02: qty 1

## 2014-11-02 MED ORDER — ZOLPIDEM TARTRATE 5 MG PO TABS: 5.0000 mg | ORAL_TABLET | Freq: Every evening | ORAL | Status: DC | PRN

## 2014-11-02 MED ORDER — ACETAMINOPHEN 650 MG RE SUPP
325.0000 mg | RECTAL | Status: DC | PRN
Start: 2014-11-02 — End: 2014-11-03

## 2014-11-02 MED ORDER — LIDOCAINE HCL (CARDIAC) 20 MG/ML IV SOLN
INTRAVENOUS | Status: AC
  Filled 2014-11-02: qty 10

## 2014-11-02 MED ORDER — FENTANYL CITRATE (PF) 250 MCG/5ML IJ SOLN
INTRAMUSCULAR | Status: DC | PRN
  Administered 2014-11-02: 25 ug via INTRAVENOUS

## 2014-11-02 MED ORDER — FENTANYL CITRATE (PF) 100 MCG/2ML IJ SOLN
25.0000 ug | INTRAMUSCULAR | Status: DC | PRN
  Administered 2014-11-02 (×2): 25 ug via INTRAVENOUS

## 2014-11-02 MED ORDER — HEPARIN SODIUM (PORCINE) 1000 UNIT/ML IJ SOLN
INTRAMUSCULAR | Status: DC | PRN
  Administered 2014-11-02: 3000 [IU] via INTRAVENOUS
  Administered 2014-11-02: 9000 [IU] via INTRAVENOUS

## 2014-11-02 MED ORDER — VANCOMYCIN HCL IN DEXTROSE 1-5 GM/200ML-% IV SOLN
1000.0000 mg | Freq: Two times a day (BID) | INTRAVENOUS | Status: AC
  Administered 2014-11-02 – 2014-11-03 (×2): 1000 mg via INTRAVENOUS
  Filled 2014-11-02 (×3): qty 200

## 2014-11-02 MED ORDER — OXYCODONE-ACETAMINOPHEN 5-325 MG PO TABS: 1.0000 | ORAL_TABLET | Freq: Four times a day (QID) | ORAL | Status: DC | PRN

## 2014-11-02 MED ORDER — OXYCODONE-ACETAMINOPHEN 5-325 MG PO TABS
1.0000 | ORAL_TABLET | ORAL | Status: DC | PRN
  Administered 2014-11-02 (×2): 2 via ORAL

## 2014-11-02 MED ORDER — POLYETHYLENE GLYCOL 3350 17 G PO PACK: 17.0000 g | PACK | Freq: Every day | ORAL | Status: DC | PRN

## 2014-11-02 MED ORDER — ALUM & MAG HYDROXIDE-SIMETH 200-200-20 MG/5ML PO SUSP: 15.0000 mL | ORAL | Status: DC | PRN

## 2014-11-02 MED ORDER — SODIUM CHLORIDE 0.9 % IR SOLN
Status: DC | PRN
  Administered 2014-11-02: 10:00:00

## 2014-11-02 MED ORDER — ROCURONIUM BROMIDE 100 MG/10ML IV SOLN
INTRAVENOUS | Status: DC | PRN
  Administered 2014-11-02: 40 mg via INTRAVENOUS

## 2014-11-02 MED ORDER — METOPROLOL TARTRATE 1 MG/ML IV SOLN: 2.0000 mg | INTRAVENOUS | Status: DC | PRN

## 2014-11-02 MED ORDER — OXYCODONE-ACETAMINOPHEN 5-325 MG PO TABS
ORAL_TABLET | ORAL | Status: AC
Start: 2014-11-02 — End: 2014-11-02
  Administered 2014-11-02: 2 via ORAL
  Filled 2014-11-02: qty 2

## 2014-11-02 MED ORDER — ACETAMINOPHEN 500 MG PO TABS
1000.0000 mg | ORAL_TABLET | Freq: Two times a day (BID) | ORAL | Status: DC
  Administered 2014-11-02 – 2014-11-03 (×2): 1000 mg via ORAL
  Filled 2014-11-02 (×3): qty 2

## 2014-11-02 MED ORDER — MUPIROCIN 2 % EX OINT
TOPICAL_OINTMENT | Freq: Two times a day (BID) | CUTANEOUS | Status: DC
  Administered 2014-11-02 – 2014-11-03 (×2): via TOPICAL
  Filled 2014-11-02: qty 22

## 2014-11-02 MED ORDER — LIDOCAINE HCL (CARDIAC) 20 MG/ML IV SOLN
INTRAVENOUS | Status: DC | PRN
  Administered 2014-11-02: 40 mg via INTRAVENOUS
  Administered 2014-11-02: 60 mg via INTRATRACHEAL

## 2014-11-02 MED ORDER — TAMSULOSIN HCL 0.4 MG PO CAPS
0.4000 mg | ORAL_CAPSULE | Freq: Every day | ORAL | Status: DC
  Administered 2014-11-03: 0.4 mg via ORAL
  Filled 2014-11-02: qty 1

## 2014-11-02 MED ORDER — ONDANSETRON HCL 4 MG/2ML IJ SOLN: 4.0000 mg | Freq: Once | INTRAMUSCULAR | Status: DC | PRN

## 2014-11-02 MED ORDER — THROMBIN 20000 UNITS EX SOLR
CUTANEOUS | Status: AC
  Filled 2014-11-02: qty 20000

## 2014-11-02 MED ORDER — FENTANYL CITRATE (PF) 250 MCG/5ML IJ SOLN
INTRAMUSCULAR | Status: AC
Start: 2014-11-02 — End: 2014-11-02
  Filled 2014-11-02: qty 5

## 2014-11-02 MED ORDER — PHENOL 1.4 % MT LIQD
1.0000 | OROMUCOSAL | Status: DC | PRN
  Filled 2014-11-02: qty 177

## 2014-11-02 MED ORDER — NITROGLYCERIN 0.4 MG SL SUBL: 0.4000 mg | SUBLINGUAL_TABLET | SUBLINGUAL | Status: DC | PRN

## 2014-11-02 MED ORDER — MECLIZINE HCL 12.5 MG PO TABS: 12.5000 mg | ORAL_TABLET | Freq: Two times a day (BID) | ORAL | Status: DC | PRN

## 2014-11-02 MED ORDER — ROCURONIUM BROMIDE 50 MG/5ML IV SOLN
INTRAVENOUS | Status: AC
  Filled 2014-11-02: qty 1

## 2014-11-02 MED ORDER — ALPRAZOLAM 0.25 MG PO TABS
0.2500 mg | ORAL_TABLET | Freq: Two times a day (BID) | ORAL | Status: DC | PRN
  Administered 2014-11-02: 0.25 mg via ORAL

## 2014-11-02 MED ORDER — MAGNESIUM SULFATE 2 GM/50ML IV SOLN: 2.0000 g | Freq: Every day | INTRAVENOUS | Status: DC | PRN

## 2014-11-02 SURGICAL SUPPLY — 49 items
CANISTER SUCTION 2500CC (MISCELLANEOUS) ×2 IMPLANT
CANNULA VESSEL 3MM 2 BLNT TIP (CANNULA) ×2 IMPLANT
CATH ROBINSON RED A/P 18FR (CATHETERS) ×2 IMPLANT
CLIP TI MEDIUM 6 (CLIP) ×2 IMPLANT
CLIP TI WIDE RED SMALL 6 (CLIP) ×2 IMPLANT
CRADLE DONUT ADULT HEAD (MISCELLANEOUS) ×2 IMPLANT
DECANTER SPIKE VIAL GLASS SM (MISCELLANEOUS) IMPLANT
DRAIN HEMOVAC 1/8 X 5 (WOUND CARE) IMPLANT
ELECT REM PT RETURN 9FT ADLT (ELECTROSURGICAL) ×2
ELECTRODE REM PT RTRN 9FT ADLT (ELECTROSURGICAL) ×1 IMPLANT
EVACUATOR SILICONE 100CC (DRAIN) IMPLANT
GAUZE SPONGE 4X4 12PLY STRL (GAUZE/BANDAGES/DRESSINGS) ×2 IMPLANT
GEL ULTRASOUND 20GR AQUASONIC (MISCELLANEOUS) IMPLANT
GLOVE BIO SURGEON STRL SZ 6.5 (GLOVE) ×2 IMPLANT
GLOVE BIO SURGEON STRL SZ7.5 (GLOVE) ×4 IMPLANT
GLOVE BIOGEL PI IND STRL 6.5 (GLOVE) ×4 IMPLANT
GLOVE BIOGEL PI INDICATOR 6.5 (GLOVE) ×4
GLOVE ECLIPSE 7.5 STRL STRAW (GLOVE) ×2 IMPLANT
GLOVE SS BIOGEL STRL SZ 6.5 (GLOVE) ×2 IMPLANT
GLOVE SUPERSENSE BIOGEL SZ 6.5 (GLOVE) ×2
GLOVE SURG SS PI 7.0 STRL IVOR (GLOVE) ×2 IMPLANT
GOWN STRL REUS W/ TWL LRG LVL3 (GOWN DISPOSABLE) ×4 IMPLANT
GOWN STRL REUS W/ TWL XL LVL3 (GOWN DISPOSABLE) ×1 IMPLANT
GOWN STRL REUS W/TWL LRG LVL3 (GOWN DISPOSABLE) ×4
GOWN STRL REUS W/TWL XL LVL3 (GOWN DISPOSABLE) ×1
KIT BASIN OR (CUSTOM PROCEDURE TRAY) ×2 IMPLANT
KIT ROOM TURNOVER OR (KITS) ×2 IMPLANT
LIQUID BAND (GAUZE/BANDAGES/DRESSINGS) ×2 IMPLANT
LOOP VESSEL MINI RED (MISCELLANEOUS) IMPLANT
NEEDLE HYPO 25GX1X1/2 BEV (NEEDLE) IMPLANT
NS IRRIG 1000ML POUR BTL (IV SOLUTION) ×4 IMPLANT
PACK CAROTID (CUSTOM PROCEDURE TRAY) ×2 IMPLANT
PAD ARMBOARD 7.5X6 YLW CONV (MISCELLANEOUS) ×4 IMPLANT
PATCH HEMASHIELD 8X75 (Vascular Products) ×2 IMPLANT
SHUNT CAROTID BYPASS 10 (VASCULAR PRODUCTS) ×2 IMPLANT
SHUNT CAROTID BYPASS 12FRX15.5 (VASCULAR PRODUCTS) IMPLANT
SPONGE INTESTINAL PEANUT (DISPOSABLE) ×2 IMPLANT
SPONGE SURGIFOAM ABS GEL 100 (HEMOSTASIS) IMPLANT
SUT ETHILON 3 0 PS 1 (SUTURE) IMPLANT
SUT PROLENE 6 0 CC (SUTURE) ×4 IMPLANT
SUT PROLENE 7 0 BV 1 (SUTURE) IMPLANT
SUT PROLENE 7 0 BV1 MDA (SUTURE) ×2 IMPLANT
SUT SILK 3 0 TIES 17X18 (SUTURE)
SUT SILK 3-0 18XBRD TIE BLK (SUTURE) IMPLANT
SUT VIC AB 3-0 SH 27 (SUTURE) ×1
SUT VIC AB 3-0 SH 27X BRD (SUTURE) ×1 IMPLANT
SUT VICRYL 4-0 PS2 18IN ABS (SUTURE) ×2 IMPLANT
SYR CONTROL 10ML LL (SYRINGE) IMPLANT
WATER STERILE IRR 1000ML POUR (IV SOLUTION) ×2 IMPLANT

## 2014-11-02 NOTE — Progress Notes (Signed)
[facility]  Subjective  - awake in PACU following commands   Objective 135/61 61 97 F (36.1 C) (Oral) 14 95%  Intake/Output Summary (Last 24 hours) at 11/02/14 1354 Last data filed at 11/02/14 1230  Gross per 24 hour  Intake   1500 ml  Output    300 ml  Net   1200 ml   No neck hematoma UE/LE 5/5 motor Tongue midline  Assessment/Planning: Stable post CEA To 3S when bed available  Fabienne Bruns 11/02/2014 1:54 PM --  Laboratory Lab Results: No results for input(s): WBC, HGB, HCT, PLT in the last 72 hours. BMET No results for input(s): NA, K, CL, CO2, GLUCOSE, BUN, CREATININE, CALCIUM in the last 72 hours.  COAG Lab Results  Component Value Date   INR 1.06 10/29/2014   INR 1.19 12/16/2012   INR 1.47 12/02/2011   PROTIME 20.2 08/17/2008   No results found for: PTT

## 2014-11-02 NOTE — Anesthesia Postprocedure Evaluation (Signed)
  Anesthesia Post-op Note  Patient: Jason [name]  Procedure(s) Performed: Procedure(s): LEFT  CAROTID ARTERY ENDARTERECTOMY  WITH DACRON PATCH ANGIOPLASTY (Left)  Patient Location: PACU  Anesthesia Type:General  Level of Consciousness: awake, alert  and oriented  Airway and Oxygen Therapy: Patient Spontanous Breathing and Patient connected to nasal cannula oxygen  Post-op Pain: mild  Post-op Assessment: Post-op Vital signs reviewed, Patient's Cardiovascular Status Stable, Respiratory Function Stable, Patent Airway and Pain level controlled LLE Motor Response: Responds to commands, Purposeful movement LLE Sensation: Full sensation, No numbness, No pain, No tingling RLE Motor Response: Responds to commands, Purposeful movement RLE Sensation: Full sensation, No numbness, No pain, No tingling      Post-op Vital Signs: stable  Last Vitals:  Filed Vitals:   11/02/14 1500  BP: 137/59  Pulse: 58  Temp:   Resp: 12    Complications: No apparent anesthesia complications

## 2014-11-02 NOTE — Transfer of Care (Signed)
Immediate Anesthesia Transfer of Care Note  Patient: Jason [name]  Procedure(s) Performed: Procedure(s): LEFT  CAROTID ARTERY ENDARTERECTOMY  WITH DACRON PATCH ANGIOPLASTY (Left)  Patient Location: PACU  Anesthesia Type:General  Level of Consciousness: awake, alert , oriented and patient cooperative  Airway & Oxygen Therapy: Patient Spontanous Breathing and Patient connected to nasal cannula oxygen  Post-op Assessment: Report given to RN, Post -op Vital signs reviewed and stable and Patient moving all extremities  Post vital signs: Reviewed and stable  Last Vitals:  Filed Vitals:   11/02/14 0913  BP: 178/72  Pulse:   Temp:   Resp:     Complications: No apparent anesthesia complications

## 2014-11-02 NOTE — Anesthesia Procedure Notes (Signed)
Procedure Name: Intubation Date/Time: 11/02/2014 10:19 AM Performed by: Julian Reil Pre-anesthesia Checklist: Patient identified, Emergency Drugs available, Suction available and Patient being monitored Patient Re-evaluated:Patient Re-evaluated prior to inductionOxygen Delivery Method: Circle system utilized Preoxygenation: Pre-oxygenation with 100% oxygen Intubation Type: IV induction Ventilation: Mask ventilation without difficulty and Oral airway inserted - appropriate to patient size Laryngoscope Size: Mac and 4 Grade View: Grade I Tube size: 7.5 mm Number of attempts: 1 Airway Equipment and Method: Stylet and LTA kit utilized Placement Confirmation: ETT inserted through vocal cords under direct vision,  positive ETCO2 and breath sounds checked- equal and bilateral Secured at: 22 cm Tube secured with: Tape Dental Injury: Teeth and Oropharynx as per pre-operative assessment

## 2014-11-02 NOTE — Anesthesia Preprocedure Evaluation (Addendum)
Anesthesia Evaluation  Patient identified by MRN, date of birth, ID band Patient awake    Reviewed: Allergy & Precautions, NPO status , Patient's Chart, lab work & pertinent test results  Airway Mallampati: II  TM Distance: >3 FB Neck ROM: Full    Dental  (+) Teeth Intact, Dental Advisory Given   Pulmonary  breath sounds clear to auscultation        Cardiovascular hypertension, Rhythm:Irregular Rate:Normal     Neuro/Psych    GI/Hepatic   Endo/Other    Renal/GU      Musculoskeletal   Abdominal   Peds  Hematology   Anesthesia Other Findings   Reproductive/Obstetrics                            Anesthesia Physical Anesthesia Plan  ASA: III  Anesthesia Plan: General   Post-op Pain Management:    Induction: Intravenous  Airway Management Planned: Oral ETT  Additional Equipment: Arterial line  Intra-op Plan:   Post-operative Plan: Extubation in OR  Informed Consent: I have reviewed the patients History and Physical, chart, labs and discussed the procedure including the risks, benefits and alternatives for the proposed anesthesia with the patient or authorized representative who has indicated his/her understanding and acceptance.   Dental advisory given  Plan Discussed with: CRNA and Anesthesiologist  Anesthesia Plan Comments: (L. Carotid Stenosis Paroxysmal Atrial Fib Hypertension CAD S/P stent 2007 H/O DVT off xarelto x 3 days  Plan GA with art line)       Anesthesia Quick Evaluation

## 2014-11-02 NOTE — Progress Notes (Signed)
59fr foley placed per MD order/protocol d/t urinary retention. Pt tolerated procedure well Mel/NT present as second person during insertion. Peri care care with soap and water provided prior to insertion and sterile procedure maintained throughout. 600cc of clear yellow urine returned. Will continue to monitor.

## 2014-11-02 NOTE — Op Note (Signed)
Procedure Left carotid endarterectomy  Preoperative diagnosis: High-grade (90%) asymptomatic left common and internal carotid artery stenosis  Postoperative diagnosis: Same  Anesthesia General  Asst.: Karsten Ro, PAC, Christa McClellan  Operative findings: #1 greater than 90% left internal carotid artery stenosis                                #2 Dacron patch extending 2 cm distally onto the internal carotid artery  Operative details: After obtaining informed consent, the patient was taken to the operating room. The patient was placed in a supine position on the operating room table. After induction of general anesthesia and endotracheal intubation a Foley catheter was placed. Next the patient's entire neck and chest was prepped and draped in the usual sterile fashion. An oblique incision was made on the left aspect of the patient's neck anterior to the border the leftt sternocleidomastoid muscle. The incision was carried into the subcutaneous tissues and through the platysma. The sternocleidomastoid muscle was identified and reflected laterally. The omohyoid muscle was identified and this was divided with cautery. The common facial vein was dissected free circumferentially and ligated and divided between silk ties.  The jugular vein was then reflected laterally.  The common carotid artery was then found at the base of the incision this was dissected free circumferentially. The vagus nerve was identified and protected. Dissection was then carried up to the level carotid bifurcation. The Ansa cervicalis was identified and traced up to its insertion into the hypoglossal nerve.  The internal carotid artery was dissected free circumferentially just below the level of the hypoglossal nerve and it was soft in character at this location and above any palpable disease. A vessel loop was placed around this. Next the external carotid and was dissected free circumferentially and vessel loops were placed around  these. The patient was given 9000 units of intravenous heparin. After 2 minutes of circulation time and raising the mean arterial pressure to 85 mm mercury, the distal internal carotid artery was controlled with small bulldog clamp. The external carotid was controlled with vessel loops. The common carotid artery was controlled with a peripheral DeBakey clamp. A longitudinal opening was made in the common carotid artery just below the bifurcation. The arteriotomy was extended distally up into the internal carotid with Potts scissors. There was a large calcified plaque with greater than 90% stenosis in the carotid bifurcation extending into the internal carotid.  The arteriotomy was extended past the level of stenosis.  A 10 Fr shunt was threaded into the distal internal carotid and allowed to backbleed.  There was brisk but not pulsatile backbleeding.  The shunt was then placed down into the common carotid and secured with a Rummel tourniquet.  The shunt was inspected and found to be free of air.  It was unclamped restoring flow to the brain.  Attention was then turned to the common carotid artery once again. A suitable endarterectomy plane was obtained and endarterectomy was begun in the common carotid artery and a reasonable proximal endpoint. An eversion endarterectomy was performed on the external carotid artery and a good endpoint was obtained. The plaque was then elevated in the internal carotid artery and a nice feathered distal endpoint was also obtained.  The plaque was passed off the table. All loose debris was then removed from the carotid bed and everything was thoroughly irrigated with heparinized saline. The distal endpoint was lifting slightly so it was  tacked with several 7 0 prolene sutures.  A Dacron patch was then brought on to the operative field and this was sewn on as a patch angioplasty using a running 6-0 Prolene suture. Prior to completion of the anastomosis the internal carotid artery was  thoroughly backbled. This was then controlled again with a find bulldog clamp.  The common carotid was thoroughly flushed forward. The external carotid was also thoroughly backbled.  The remainder of the patch was completed and the anastomosis was secured. Flow was then restored first retrograde from the external carotid into the carotid bed then antegrade from the common carotid to the external carotid artery and after approximately 5 cardiac cycles to the internal carotid artery. Doppler was used to evaluate the external/internal and common carotid arteries and these all had good Doppler flow. Hemostasis was obtained with 60 mg of Protamine.  The patient had been given an additional 3000 units of heparin during the case.   The platysma muscle was reapproximated using a running 3-0 Vicryl suture. The skin was closed with 4 0 Vicryl subcuticular stitch.  The patient was awakened in the operating room and was moving upper and lower extremities symmetrically and following commands.  The patient was stable on arrival to the PACU.  Fabienne Bruns, MD [facility] Office: [phone] Pager: [phone]

## 2014-11-02 NOTE — H&P (View-Only) (Signed)
[facility] [place] HISTORY AND PHYSICAL   History of Present Illness:  Patient is a 81 y.o. year old male who presents for evaluation of asymptomatic internal carotid artery stenosis. The patient had a recent duplex exam for evaluation of dizziness. This showed a high-grade greater than 80% left internal carotid artery stenosis with velocities of 450/163. Patient also had an MRA of the brain which showed diminished flow of the left internal carotid artery. He denies any localized symptoms of stroke TIA or amaurosis. His prior history of stroke. He is fairly active overall and lives fairly independently in an assisted living. He also recently had a cardiac monitor device placed and there was no evidence of significant arrhythmia as a cause for his dizziness. He does have a history of chronic dizziness in the past but states this had A little bit worse recently. He is on Xarelto. This apparently is for previous history of DVT and pulmonary embolus. The patient does have an IVC filter.  He did have a scar on his right neck consistent with carotid endarterectomy but the patient does not recall the details regarding this.  Other medical problems include coronary artery disease followed by Dr. Cooper, hyperlipidemia, reflux, peripheral neuropathy, decreased hearing,  Past Medical History  Diagnosis Date  . PVD (peripheral vascular disease)   . CAD (coronary artery disease)     a. 12/2005 - PCI to OM  . Hyperlipidemia   . Cerebrovascular accident   . DVT femoral (deep venous thrombosis) with thrombophlebitis 03/2007    /notes 07/13/2010  . GERD (gastroesophageal reflux disease)   . Edema   . Anxiety   . Vertigo   . IBS (irritable bowel syndrome)   . Duodenitis   . Gastritis   . Esophageal stricture   . Cellulitis   . Pulmonary embolism   . Prostatic hypertrophy   . Frequent falls   . Subdural hematoma   . PTSD (post-traumatic stress disorder)   . Vertigo   . TIA (transient  ischemic attack)     a. 01/2006 and 05/2006.   . Hiatal hernia     periferal neuropathy  . Hx of echocardiogram     Echo 9/13:  Mild LVH, EF 60-65%, Gr 1 diast dysfn, mild AI, mild BAE  . Gout   . Chronic low back pain   . Gait disorder   . Peripheral neuropathy   . Hydrocele   . History of shingles     Left lumbar  . HOH (hard of hearing)     hearing aid  . Myocardial infarction "years ago"    "dr said I'd had a small one"  . Daily headache   . Depression   . Urinary frequency   . Blind left eye     Past Surgical History  Procedure Laterality Date  . Hand surgery Left     "replaced bone in my finger"  . Angioplasty    . Carotid endarterectomy Left   . Coronary angioplasty with stent placement      left circumflex  . Cataract extraction, bilateral    . Tonsillectomy    . Tympanic membrane repair Right 1978    /notes 07/27/2010; "couldn'getting so t couldn't hear; had noise in my ear; didn't correct either  . Hand contracture release Right     palm/notes 07/27/2010  . Esophagogastroduodenoscopy (egd) with esophageal dilation      Social History Social History  Substance Use Topics  . Smoking status: Never Smoker   .   Smokeless tobacco: Never Used  . Alcohol Use: 0.0 oz/week    0 Standard drinks or equivalent per week     Comment: wine occasionally    Family History Family History  Problem Relation Age of Onset  . Colon cancer Neg Hx   . Coronary artery disease Brother   . Heart attack Father   . Migraines Mother   . Breast cancer Sister   . Dementia Sister   . Renal Disease Brother     Allergies  Allergies  Allergen Reactions  . Ativan [Lorazepam]   . Augmentin [Amoxicillin-Pot Clavulanate] Nausea And Vomiting    daughter states he can tolerate Amoxicillin ok  . Fludrocortisone Acetate Other (See Comments)    unknown  . Scopace [Scopolamine] Other (See Comments)    *patch Unknown reaction     Current Outpatient Prescriptions  Medication Sig  Dispense Refill  . acetaminophen (TYLENOL) 500 MG tablet Take 2 tablets (1,000 mg total) by mouth 2 (two) times daily. 60 tablet 11  . ALPRAZolam (XANAX) 0.25 MG tablet Take 1 tablet (0.25 mg total) by mouth 2 (two) times daily as needed for anxiety. 10 tablet 0  . amLODipine (NORVASC) 5 MG tablet Take 1 tablet (5 mg total) by mouth daily. 90 tablet 3  . DEXILANT 60 MG capsule TAKE ONE CAPSULE BY MOUTH EVERY DAY 30 capsule 11  . diclofenac sodium (VOLTAREN) 1 % GEL Apply 2 g topically 4 (four) times daily. 100 g 5  . docusate sodium (COLACE) 100 MG capsule Take 1 capsule (100 mg total) by mouth at bedtime. 30 capsule 11  . DULoxetine (CYMBALTA) 30 MG capsule TAKE ONE CAPSULE BY MOUTH EVERY DAY 30 capsule 6  . HYDROcodone-acetaminophen (NORCO) 5-325 MG per tablet Take 1 tablet by mouth every 6 (six) hours as needed for moderate pain or severe pain. 10 tablet 0  . latanoprost (XALATAN) 0.005 % ophthalmic solution Place 1 drop into both eyes at bedtime.    . meclizine (ANTIVERT) 12.5 MG tablet Take 12.5 mg by mouth every 12 (twelve) hours as needed for dizziness.    . MYRBETRIQ 25 MG TB24 tablet TAKE 1 TABLET BY MOUTH EVERY DAY 30 tablet 11  . nitroGLYCERIN (NITROSTAT) 0.4 MG SL tablet Place 0.4 mg under the tongue every 5 (five) minutes as needed. Chest pain    . ondansetron (ZOFRAN) 4 MG tablet Take 4 mg by mouth every 8 (eight) hours as needed for nausea.    . polyethylene glycol powder (GLYCOLAX/MIRALAX) powder MIX 17 GRAMS WITH 4 TO 6 OUNCES OF WATER AND DRINK ONCE DAILY AS NEEDED FOR CONSTIPATION 527 g 2  . predniSONE (DELTASONE) 10 MG tablet Take 1 tablet (10 mg total) by mouth daily with breakfast. 30 tablet 5  . promethazine (PHENERGAN) 25 MG tablet TAKE 1 TABLET BY MOUTH EVERY 6 HOURS AS NEEDED FOR NAUSEA 30 tablet 0  . tamsulosin (FLOMAX) 0.4 MG CAPS capsule Take 0.4 mg by mouth daily.    . XARELTO 15 MG TABS tablet TAKE 1 TABLET BY MOUTH EVERY DAY WITH SUPPER 30 tablet 6  . zolpidem  (AMBIEN) 5 MG tablet Take 5 mg by mouth at bedtime as needed for sleep.    . predniSONE (DELTASONE) 20 MG tablet 3 tabs poqday 1-2, 2 tabs poqday 3-4, 1 tab poqday 5-6 (Patient not taking: Reported on 10/21/2014) 12 tablet 0   No current facility-administered medications for this visit.    ROS:   General:  No weight loss, Fever, chills    HEENT: No recent headaches, no nasal bleeding, no visual changes, no sore throat  Neurologic: + dizziness, no blackouts, no seizures. No recent symptoms of stroke or mini- stroke. No recent episodes of slurred speech, or temporary blindness.  Cardiac: No recent episodes of chest pain/pressure, no shortness of breath at rest.  No shortness of breath with exertion.  Denies history of atrial fibrillation or irregular heartbeat  Vascular: No history of rest pain in feet.  No history of claudication.  No history of non-healing ulcer, + history of DVT   Pulmonary: No home oxygen, no productive cough, no hemoptysis,  No asthma or wheezing  Musculoskeletal:  [ ] Arthritis, [ ] Low back pain,  [ ] Joint pain  Hematologic:No history of hypercoagulable state.  No history of easy bleeding.  No history of anemia  Gastrointestinal: No hematochezia or melena,  No gastroesophageal reflux, no trouble swallowing  Urinary: [ ] chronic Kidney disease, [ ] on HD - [ ] MWF or [ ] TTHS, [ ] Burning with urination, [ ] Frequent urination, [ ] Difficulty urinating;   Skin: No rashes  Psychological: + history of anxiety,  + history of depression   Physical Examination  Filed Vitals:   10/21/14 1320 10/21/14 1327  BP: 179/78 157/73  Pulse: 85 78  Temp: 97.6 F (36.4 C)   Resp: 18   Height: 6' (1.829 m)   Weight: 190 lb (86.183 kg)   SpO2: 95%     Body mass index is 25.76 kg/(m^2).  General:  Alert and oriented, no acute distress HEENT: Normal with exception of very poor hearing Neck: Right-sided carotid bruit, no left carotid bruit, no JVD, well-healed right  neck scar consistent with carotid endarterectomy Pulmonary: Clear to auscultation bilaterally Cardiac: Regular Rate and Rhythm without murmur Abdomen: Soft, non-tender, non-distended, no mass Skin: No rash Extremity Pulses:  2+ radial, brachial, femoral, absent dorsalis pedis, posterior tibial pulses bilaterally Musculoskeletal: No deformity or edema  Neurologic: Upper and lower extremity motor 5/5 and symmetric  DATA:  Carotid duplex scan from [facility] is reviewed today. Right side velocities 175/40, left side 450/163   ASSESSMENT:  Patient with high-grade greater than 80% stenosis currently asymptomatic. Previous remote right carotid endarterectomy with no significant recurrence.   PLAN:  Patient would benefit from left carotid endarterectomy for stroke prophylaxis. I doubt fixing his carotid artery we'll fix his dizziness symptoms is most of these are chronic. However, he certainly is at risk of stroke with the high-grade stenosis on the left side. Risks benefits possible complications and procedure details a carotid endarterectomy were discussed today with the patient and his daughter. These include but are not limited to bleeding infection stroke risk of 1-2% cranial nerve injury wrist tendon 15% bleeding infection both less than 1%. They understand and he certainly wishes to proceed with carotid endarterectomy. However we will have him visit with Dr. Cooper for preoperative cardiac risk stratification and discussions on whether or not he needs bridging of his Xarelto. I would place him on aspirin for at least a short-term perioperatively. We will schedule him for his carotid endarterectomy after his visit with Dr. Cooper.  Kayleana Waites, MD [facility] [place] Office: [phone] Pager: [phone]   

## 2014-11-02 NOTE — Progress Notes (Signed)
Pt transferred into 3s13,with RN on monitor. Pt VSS, neuro intact. Family at bedside.

## 2014-11-02 NOTE — Progress Notes (Signed)
     Alert and oriented No tongue deviation smile symmetric Incision without hematoma   S/P left CEA Awaiting 3S bed Plan D/C in am  Sebastyan Snodgrass MAUREEN PA-C

## 2014-11-02 NOTE — Interval H&P Note (Signed)
History and Physical Interval Note:  11/02/2014 9:51 AM  Jason [name]  has presented today for surgery, with the diagnosis of Left carotid artery stenosis I65.22  The various methods of treatment have been discussed with the patient and family. After consideration of risks, benefits and other options for treatment, the patient has consented to  Procedure(s): ENDARTERECTOMY CAROTID (Left) as a surgical intervention .  The patient's history has been reviewed, patient examined, no change in status, stable for surgery.  I have reviewed the patient's chart and labs.  Questions were answered to the patient's satisfaction.     Fabienne Bruns

## 2014-11-03 ENCOUNTER — Encounter (HOSPITAL_COMMUNITY): Payer: Self-pay | Admitting: Vascular Surgery

## 2014-11-03 LAB — CBC
HCT: 36.4 % — ABNORMAL LOW (ref 39.0–52.0)
Hemoglobin: 12 g/dL — ABNORMAL LOW (ref 13.0–17.0)
MCH: 32.3 pg (ref 26.0–34.0)
MCHC: 33 g/dL (ref 30.0–36.0)
MCV: 97.8 fL (ref 78.0–100.0)
PLATELETS: 129 10*3/uL — AB (ref 150–400)
RBC: 3.72 MIL/uL — ABNORMAL LOW (ref 4.22–5.81)
RDW: 15 % (ref 11.5–15.5)
WBC: 7.8 10*3/uL (ref 4.0–10.5)

## 2014-11-03 LAB — BASIC METABOLIC PANEL
ANION GAP: 6 (ref 5–15)
BUN: 22 mg/dL — AB (ref 6–20)
CALCIUM: 7.8 mg/dL — AB (ref 8.9–10.3)
CO2: 24 mmol/L (ref 22–32)
Chloride: 108 mmol/L (ref 101–111)
Creatinine, Ser: 1.48 mg/dL — ABNORMAL HIGH (ref 0.61–1.24)
GFR calc Af Amer: 45 mL/min — ABNORMAL LOW (ref >60)
GFR, EST NON AFRICAN AMERICAN: 39 mL/min — AB (ref >60)
GLUCOSE: 91 mg/dL (ref 65–99)
Potassium: 4.1 mmol/L (ref 3.5–5.1)
Sodium: 138 mmol/L (ref 135–145)

## 2014-11-03 MED ORDER — HYDROCODONE-ACETAMINOPHEN 5-325 MG PO TABS: 1.0000 | ORAL_TABLET | Freq: Four times a day (QID) | ORAL | Status: DC | PRN

## 2014-11-03 MED ORDER — ASPIRIN 81 MG PO TBEC: 81.0000 mg | DELAYED_RELEASE_TABLET | Freq: Every day | ORAL | Status: DC

## 2014-11-03 MED ORDER — SIMVASTATIN 10 MG PO TABS
10.0000 mg | ORAL_TABLET | Freq: Every day | ORAL | Status: DC
  Filled 2014-11-03: qty 1

## 2014-11-03 MED ORDER — SIMVASTATIN 10 MG PO TABS: 10.0000 mg | ORAL_TABLET | Freq: Every day | ORAL | Status: DC

## 2014-11-03 NOTE — Progress Notes (Addendum)
  [facility] Progress Note  Subjective  - POD #1 Having headache towards base of skull and toothache (chronic).   Tmax 98.5 BP sys 110s-200s 02 96% 2L  Objective Filed Vitals:   11/03/14 0340  BP: 141/51  Pulse: 59  Temp:   Resp: 20    Intake/Output Summary (Last 24 hours) at 11/03/14 0732 Last data filed at 11/03/14 0600  Gross per 24 hour  Intake 2696.67 ml  Output   2025 ml  Net 671.67 ml   Left neck without hematoma Tongue midline, smile symmetric, 5/5 strength upper and lower extremities  Assessment/Planning: 81 y.o. male is s/p: left carotid stenosis 1 Day Post-Op   Neuro exam intact. Pain meds for headache. Urinary retention last night and foley placed. D/ced this am. Needs to void. Needs to ambulate Restart xarelto today Rankin score: 0 VQI: on ASA and statin  D/c home later today if able to void, headache improves and able to ambulate adequately.   Raymond Gurney 11/03/2014 7:32 AM -- Agree with above Back to assisted living today if he can void  Fabienne Bruns, MD [facility] [place] Office: [phone] Pager: [phone]  Laboratory CBC    Component Value Date/Time   WBC 7.8 11/03/2014 0535   HGB 12.0* 11/03/2014 0535   HCT 36.4* 11/03/2014 0535   PLT 129* 11/03/2014 0535    BMET    Component Value Date/Time   NA 138 11/03/2014 0535   K 4.1 11/03/2014 0535   CL 108 11/03/2014 0535   CO2 24 11/03/2014 0535   GLUCOSE 91 11/03/2014 0535   GLUCOSE 121* 01/01/2006 1450   BUN 22* 11/03/2014 0535   CREATININE 1.48* 11/03/2014 0535   CREATININE 1.21 01/13/2014 1439   CALCIUM 7.8* 11/03/2014 0535   GFRNONAA 39* 11/03/2014 0535   GFRNONAA 52* 01/13/2014 1439   GFRAA 45* 11/03/2014 0535   GFRAA 60 01/13/2014 1439    COAG Lab Results  Component Value Date   INR 1.06 10/29/2014   INR 1.19 12/16/2012   INR 1.47 12/02/2011   PROTIME 20.2 08/17/2008   No results found for:  PTT  Antibiotics Anti-infectives    Start     Dose/Rate Route Frequency Ordered Stop   11/02/14 2200  vancomycin (VANCOCIN) IVPB 1000 mg/200 mL premix     1,000 mg 200 mL/hr over 60 Minutes Intravenous Every 12 hours 11/02/14 1938 11/03/14 2159   11/02/14 1030  vancomycin (VANCOCIN) IVPB 1000 mg/200 mL premix     1,000 mg 200 mL/hr over 60 Minutes Intravenous 60 min pre-op 11/02/14 0847 11/02/14 1107       Maris Berger, PA-C [facility] Office: [phone] Pager: [phone] 11/03/2014 7:32 AM

## 2014-11-03 NOTE — Evaluation (Addendum)
Physical Therapy Evaluation Patient Details Name: Jason [name] MRN: [id number] DOB: 1920-01-06 Today's Date: 11/03/2014   History of Present Illness  Patient is a 81 y/o male admitted for Lt. CEA due to carotid artery stenosis.  His PMH includes hyperlipidemia, reflux, peripheral neuropathy, decreased hearing, chronic dizziness, CAD, CVA, DVT and pulmonary embolus with IVC filter.  Clinical Impression  Patient presents with decreased mobility due to deficits listed in PT problem list and will benefit from skilled PT in the acute setting to maximize independence and safety for return to ALF.  Patient feels he will return quickly to water aerobics so do not feel follow up PT indicated at this time, however, if restricted due to surgery from water activity may benefit from short term HHPT.    Follow Up Recommendations No PT follow up    Equipment Recommendations  None recommended by PT    Recommendations for Other Services       Precautions / Restrictions Precautions Precautions: Fall Precaution Comments: reports fell about 6 months ago and then started using rollator (scar on forehead he implies due to fall)      Mobility  Bed Mobility Overal bed mobility: Needs Assistance             General bed mobility comments: not tested this session, but reports nurse assisted him up this morning (pt up in chair)  Transfers Overall transfer level: Needs assistance Equipment used: 4-wheeled walker Transfers: Sit to/from Stand Sit to Stand: Supervision         General transfer comment: for safety due to lines  Ambulation/Gait Ambulation/Gait assistance: Supervision;Min guard Ambulation Distance (Feet): 150 Feet Assistive device: 4-wheeled walker Gait Pattern/deviations: Step-through pattern;Decreased stride length;Trunk flexed;Wide base of support;Shuffle     General Gait Details: reports history of difficulty with left turns, loss of balance today with right turn while looking  to right minguard to recover; more careful on left turns  Stairs            Wheelchair Mobility    Modified Rankin (Stroke Patients Only)       Balance Overall balance assessment: History of Falls;Needs assistance         Standing balance support: Bilateral upper extremity supported Standing balance-Leahy Scale: Poor Standing balance comment: relies on UE support for balance                             Pertinent Vitals/Pain Pain Assessment: No/denies pain    Home Living Family/patient expects to be discharged to:: Assisted living ([facility])               Home Equipment: Dan Humphreys - 4 wheels;Shower seat;Bedside commode Additional Comments: assist for meals and medications, blind in right eye, but reports still has car and active license    Prior Function Level of Independence: Independent with assistive device(s)         Comments: [facility]ob[facility]     Hand Dominance        Extremity/Trunk Assessment               Lower Extremity Assessment: Overall WFL for tasks assessed      Cervical / Trunk Assessment: Lordotic  Communication   Communication: HOH  Cognition Arousal/Alertness: Awake/alert Behavior During Therapy: WFL for tasks assessed/performed Overall Cognitive Status: Within Functional Limits for tasks assessed  General Comments      Exercises        Assessment/Plan    PT Assessment Patient needs continued PT services  PT Diagnosis Abnormality of gait   PT Problem List Decreased balance;Decreased activity tolerance;Decreased mobility;Cardiopulmonary status limiting activity  PT Treatment Interventions DME instruction;Balance training;Gait training;Functional mobility training;Patient/family education;Therapeutic activities;Therapeutic exercise   PT Goals (Current goals can be found in the Care Plan section) Acute Rehab PT Goals Patient Stated  Goal: To go back to ALF, return to water exercise PT Goal Formulation: With patient Time For Goal Achievement: 11/10/14 Potential to Achieve Goals: Good    Frequency Min 3X/week   Barriers to discharge        Co-evaluation               End of Session Equipment Utilized During Treatment: Gait belt Activity Tolerance: Patient tolerated treatment well Patient left: in chair;with call bell/phone within reach Nurse Communication: Other (comment) (O2 sats 88-89% at rest, up to 93% with ambulation RN states okay to leave off O2)         Time: 4098-1191 PT Time Calculation (min) (ACUTE ONLY): 30 min   Charges:   PT Evaluation $Initial PT Evaluation Tier I: 1 Procedure PT Treatments $Gait Training: 8-22 mins   PT G Codes:        WYNN,CYNDI 11/21/2014, 9:51 AM  Sheran Lawless, PT [phone] 21-Nov-2014

## 2014-11-03 NOTE — Discharge Summary (Signed)
[facility] Discharge Summary  Jason [name] 02-16-1920 81 y.o. male  [id number]  Admission Date: 11/02/2014  Discharge Date: 11/03/2014  Physician: Sherren Kerns, MD  Admission Diagnosis: Left carotid artery stenosis I65.22  HPI:   This is a 81 y.o. male who presented for evaluation of asymptomatic internal carotid artery stenosis. The patient had a recent duplex exam for evaluation of dizziness. This showed a high-grade greater than 80% left internal carotid artery stenosis with velocities of 450/163. Patient also had an MRA of the brain which showed diminished flow of the left internal carotid artery. He denies any localized symptoms of stroke TIA or amaurosis. His prior history of stroke. He is fairly active overall and lives fairly independently in an assisted living. He also recently had a cardiac monitor device placed and there was no evidence of significant arrhythmia as a cause for his dizziness. He does have a history of chronic dizziness in the past but states this had A little bit worse recently. He is on Xarelto. This apparently is for previous history of DVT and pulmonary embolus. The patient does have an IVC filter. He did have a scar on his right neck consistent with carotid endarterectomy but the patient does not recall the details regarding this. Other medical problems include coronary artery disease followed by Dr. Excell Seltzer, hyperlipidemia, reflux, peripheral neuropathy, decreased hearing,   Hospital Course:  The patient was admitted to the hospital and taken to the operating room on 11/02/2014 and underwent left carotid endarterectomy. The patient tolerated the procedure well and was transported to the PACU in stable condition.  By POD 1, the patient's neuro status was intact. His left neck incision was healing well without hematoma. Overnight, had some urinary retention and a foley catheter was placed. It was discontinued in the morning and the  patient was able to void adequately. He did have a headache towards the back of his head but the patient reported that he gets regular headaches. He also complained of a toothache which was present preoperatively. He was restarted on his xarelto. He ambulated adequately with physical therapy and tolerated a diet. He was discharged back to his assisted living facility in good condition.     Recent Labs  11/03/14 0535  NA 138  K 4.1  CL 108  CO2 24  GLUCOSE 91  BUN 22*  CALCIUM 7.8*    Recent Labs  11/03/14 0535  WBC 7.8  HGB 12.0*  HCT 36.4*  PLT 129*   No results for input(s): INR in the last 72 hours.  Discharge Instructions:   The patient is discharged to assisted living facility with extensive instructions on wound care and progressive ambulation.  They are instructed not to drive or perform any heavy lifting until returning to see the physician in his office.  Discharge Instructions    Call MD for:  redness, tenderness, or signs of infection (pain, swelling, bleeding, redness, odor or green/yellow discharge around incision site)    Complete by:  As directed      Call MD for:  severe or increased pain, loss or decreased feeling  in affected limb(s)    Complete by:  As directed      Call MD for:  temperature >100.5    Complete by:  As directed      Discharge instructions    Complete by:  As directed   You may shower in 24 hours     Driving Restrictions    Complete  by:  As directed   No driving for 2 weeks     Increase activity slowly    Complete by:  As directed   Walk with assistance use walker or cane as needed     Lifting restrictions    Complete by:  As directed   No lifting for 6 weeks     Resume previous diet    Complete by:  As directed            Discharge Diagnosis:  Left carotid artery stenosis I65.22  Secondary Diagnosis: Patient Active Problem List   Diagnosis Date Noted  . Carotid stenosis 11/02/2014  . Orthostatic hypotension 06/29/2014  .  Contusion   . Syncope and collapse 05/22/2014  . Scalp laceration 05/22/2014  . Acute renal failure superimposed on stage 3 chronic kidney disease 05/22/2014  . Acute bronchitis 05/22/2014  . Gait disorder 02/04/2014  . Hereditary and idiopathic peripheral neuropathy 02/04/2014  . SOB (shortness of breath) 12/17/2012  . Breast pain 09/08/2012  . Breast lump 09/08/2012  . OA (osteoarthritis) of knee 09/08/2012  . Peripheral edema 09/08/2012  . History of DVT (deep vein thrombosis) 03/02/2012  . Dehydration 03/02/2012  . UTI (lower urinary tract infection) 12/18/2011  . Sepsis 12/18/2011  . Transaminitis 12/18/2011  . PAF (paroxysmal atrial fibrillation) 12/09/2011  . ARF (acute renal failure) 12/05/2011  . Herpes zoster 12/02/2011  . Thrombocytopenia 12/02/2011  . CAD (coronary artery disease) 12/02/2011  . CKD (chronic kidney disease), stage III 12/02/2011  . Chronic constipation 10/04/2010  . HYPERTENSION, BENIGN 09/17/2009  . Irritable bowel syndrome 10/15/2007  . VERTIGO 06/25/2007  . ANXIETY 05/27/2007  . PERIPHERAL VASCULAR DISEASE 01/18/2007  . GERD 01/18/2007  . HYPERLIPIDEMIA 10/03/2006   Past Medical History  Diagnosis Date  . PVD (peripheral vascular disease)   . CAD (coronary artery disease)     a. 12/2005 - PCI to OM  . Hyperlipidemia   . Cerebrovascular accident   . DVT femoral (deep venous thrombosis) with thrombophlebitis 03/2007    [name] 07/13/2010  . GERD (gastroesophageal reflux disease)   . Edema   . Anxiety   . Vertigo   . IBS (irritable bowel syndrome)   . Duodenitis   . Gastritis   . Esophageal stricture   . Cellulitis   . Pulmonary embolism   . Prostatic hypertrophy   . Frequent falls   . Subdural hematoma   . PTSD (post-traumatic stress disorder)   . Vertigo   . TIA (transient ischemic attack)     a. 01/2006 and 05/2006.   [name] Hiatal hernia     periferal neuropathy  . Hx of echocardiogram     Echo 9/13:  Mild LVH, EF 60-65%, Gr 1 diast  dysfn, mild AI, mild BAE  . Gout   . Chronic low back pain   . Gait disorder   . Peripheral neuropathy   . Hydrocele   . History of shingles     Left lumbar  . HOH (hard of hearing)     hearing aid, totally in R ear ( no hearing aid in L )   . Myocardial infarction "years ago"    "dr said I'd had a small one"  . Daily headache   . Depression   . Urinary frequency   . Blind left eye   . Hypertension   . Dysrhythmia   . Arthritis       Medication List    TAKE these medications  acetaminophen 500 MG tablet  Commonly known as:  TYLENOL  Take 2 tablets (1,000 mg total) by mouth 2 (two) times daily.     ALPRAZolam 0.25 MG tablet  Commonly known as:  XANAX  Take 1 tablet (0.25 mg total) by mouth 2 (two) times daily as needed for anxiety.     amLODipine 10 MG tablet  Commonly known as:  NORVASC  Take 1 tablet (10 mg total) by mouth daily.     aspirin 81 MG EC tablet  Take 1 tablet (81 mg total) by mouth daily.     CLEAR EYES FOR DRY EYES OP  Apply 1 drop to eye daily. Mid-day     DEXILANT 60 MG capsule  Generic drug:  dexlansoprazole  TAKE ONE CAPSULE BY MOUTH EVERY DAY     diclofenac sodium 1 % Gel  Commonly known as:  VOLTAREN  Apply 2 g topically 4 (four) times daily.     docusate sodium 100 MG capsule  Commonly known as:  COLACE  Take 1 capsule (100 mg total) by mouth at bedtime.     DULoxetine 30 MG capsule  Commonly known as:  CYMBALTA  TAKE ONE CAPSULE BY MOUTH EVERY DAY     HYDROcodone-acetaminophen 5-325 MG per tablet  Commonly known as:  NORCO  Take 1 tablet by mouth every 6 (six) hours as needed for moderate pain or severe pain.     latanoprost 0.005 % ophthalmic solution  Commonly known as:  XALATAN  Place 1 drop into both eyes at bedtime.     meclizine 12.5 MG tablet  Commonly known as:  ANTIVERT  Take 12.5 mg by mouth every 12 (twelve) hours as needed for dizziness.     mupirocin ointment 2 %  Commonly known as:  BACTROBAN  1  Application, nasally, 2 times daily for 5 days prior to surgery     MYRBETRIQ 25 MG Tb24 tablet  Generic drug:  mirabegron ER  TAKE 1 TABLET BY MOUTH EVERY DAY     nitroGLYCERIN 0.4 MG SL tablet  Commonly known as:  NITROSTAT  Place 0.4 mg under the tongue every 5 (five) minutes as needed. Chest pain     ondansetron 4 MG tablet  Commonly known as:  ZOFRAN  Take 4 mg by mouth every 8 (eight) hours as needed for nausea.     oxyCODONE-acetaminophen 5-325 MG per tablet  Commonly known as:  PERCOCET/ROXICET  Take 1 tablet by mouth every 6 (six) hours as needed for severe pain.     polyethylene glycol powder powder  Commonly known as:  GLYCOLAX/MIRALAX  MIX 17 GRAMS WITH 4 TO 6 OUNCES OF WATER AND DRINK ONCE DAILY AS NEEDED FOR CONSTIPATION     predniSONE 10 MG tablet  Commonly known as:  DELTASONE  Take 1 tablet (10 mg total) by mouth daily with breakfast.     promethazine 25 MG tablet  Commonly known as:  PHENERGAN  TAKE 1 TABLET BY MOUTH EVERY 6 HOURS AS NEEDED FOR NAUSEA     simvastatin 10 MG tablet  Commonly known as:  ZOCOR  Take 1 tablet (10 mg total) by mouth daily at 6 PM.     tamsulosin 0.4 MG Caps capsule  Commonly known as:  FLOMAX  Take 0.4 mg by mouth daily.     XARELTO 15 MG Tabs tablet  Generic drug:  Rivaroxaban  TAKE 1 TABLET BY MOUTH EVERY DAY WITH SUPPER     zolpidem 5 MG tablet  Commonly known as:  AMBIEN  Take 5 mg by mouth at bedtime as needed for sleep.        Vicodin #20 No Refill  Disposition: Assisted living facility  Patient's condition: is Good  Follow up: 1. Dr.  Darrick Penna in 2 weeks.   Maris Berger, PA-C [facility] [phone]  --- For [facility] use --- Instructions: Press F2 to tab through selections.  Delete question if not applicable.   Modified Rankin score at D/C (0-6): 0  IV medication needed for:  1. Hypertension: No 2. Hypotension: No  Post-op Complications: No  1. Post-op CVA or TIA:  No  2. CN injury: No  3. Myocardial infarction: No  4.  CHF: No  5.  Dysrhythmia (new): No  6. Wound infection: No  7. Reperfusion symptoms: No  8. Return to OR: No  Discharge medications: Statin use:  Yes If No: [ ]  For Medical reasons, [ ]  Non-compliant, [ ]  Not-indicated ASA use:  Yes  If No: [ ]  For Medical reasons, [ ]  Non-compliant, [ ]  Not-indicated Beta blocker use:  No If No: [ ]  For Medical reasons, [ ]  Non-compliant, [ x] Not-indicated ACE-Inhibitor use:  No If No: [ ]  For Medical reasons, [ ]  Non-compliant, [x ] Not-indicated P2Y12 Antagonist use: No, [ ]  Plavix, [ ]  Plasugrel, [ ]  Ticlopinine, [ ]  Ticagrelor, [ ]  Other, [ ]  No for medical reason, [ ]  Non-compliant, [x ] Not-indicated Anti-coagulant use:  Yes, [ ]  Warfarin, [x ] Rivaroxaban, [ ]  Dabigatran, [ ]  Other, [ ]  No for medical reason, [ ]  Non-compliant, [ ]  Not-indicated

## 2014-11-03 NOTE — Care Management Note (Addendum)
Case Management Note  Patient Details  Name: ALEXSANDER CAVINS MRN: [id number] Date of Birth: 04-Aug-1919  Subjective/Objective:                  Assessment/Planning:  Admitted with  left carotid stenosis, s/p CEA.  Action/Plan: Return to ALF when medically stable. CM to f/u with d/c needs.  Expected Discharge Date:                  Expected Discharge Plan:  Assisted Living / Rest Home  In-House Referral:  Clinical Social Work  Discharge planning Services  CM Consult  Post Acute Care Choice:    Choice offered to:     DME Arranged:    DME Agency:     HH Arranged:    HH Agency:     Status of Service:  In process, will continue to follow  Medicare Important Message Given:    Date Medicare IM Given:    Medicare IM give by:    Date Additional Medicare IM Given:    Additional Medicare Important Message give by:     If discussed at Long Length of Stay Meetings, dates discussed:    Additional Comments: Juventino Slovak (Daughter)[phone], Kaseem Vastine [facility])  [phone]   Gae Gallop [place], [place] [phone] 11/03/2014, 1:18 PM

## 2014-11-03 NOTE — Clinical Social Work Note (Signed)
Clinical Social Work Assessment  Patient Details  Name: Jason [name] MRN: [id number] Date of Birth: 23-Nov-1919  Date of referral:  11/03/14               Reason for consult:   (Return to [place] )                Housing/Transportation Living arrangements for the past 2 months:  [place] of Information:  Patient Patient Interpreter Needed:  None Criminal Activity/Legal Involvement Pertinent to Current Situation/Hospitalization:  No - Comment as needed Significant Relationships:  Adult Children Lives with:  Facility Resident Do you feel safe going back to the place where you live?  Yes Need for family participation in patient care:  No (Coment)  Care giving concerns:  None    Facilities manager / plan:  CSW met the pt at bedside. CSW introduced self and purpose of the visit. Pt confirmed that he is a resident at [place]. Pt reported that he would like to return. CSW uploaded the pt's clinicals to [facility]. Pt reported that his daughter will transport CSW answered all questions in which the pt inquired about. CSW will continue to follow this pt and assist with discharge as needed.   Employment status:  Retired Nurse, adult PT Recommendations:  No Follow Up Information / Referral to community resources:   (N/A)  Patient/Family's Response to care: Pt reported that care has been well.   Patient/Family's Understanding of and Emotional Response to Diagnosis, Current Treatment, and Prognosis:  Pt acknowledged his current diagnosis and treatment. Pt reported that he is ready to return home.    Emotional Assessment Appearance:  Appears stated age Attitude/Demeanor/Rapport:   (Pleasant ) Affect (typically observed):  Accepting Orientation:  Oriented to Self, Oriented to Place, Oriented to  Time, Oriented to Situation Alcohol / Substance use:  Not Applicable Psych involvement (Current and /or in the  community):  No (Comment)  Discharge Needs  Concerns to be addressed:  Denies Needs/Concerns at this time Readmission within the last 30 days:  No Current discharge risk:  None Barriers to Discharge:  No Barriers Identified   Lunah Losasso, LCSW 11/03/2014, 12:35 PM

## 2014-11-03 NOTE — Progress Notes (Addendum)
Pt to d/c to [facility] asst. Living. Family present & will take patient back to [facility]. Called [facility] with details re [facility], d/c summary, general instructions.

## 2014-11-03 NOTE — Clinical Social Work Note (Signed)
CSW met pt and the pt's daugthter at bedside. CSW introduce self and purpose of visit. CSW informed the pt that he will discharge today. Pt's confirmed that his daughter will transport him. CSW upload the pt's discharge summary. Bedside RN provided number to call report.   [place] Neighbors, MSW, [place]

## 2014-11-04 ENCOUNTER — Telehealth: Payer: Self-pay | Admitting: *Deleted

## 2014-11-04 ENCOUNTER — Telehealth: Payer: Self-pay | Admitting: Vascular Surgery

## 2014-11-04 NOTE — Telephone Encounter (Signed)
Spoke with pts daughter to schedule, dpm °

## 2014-11-04 NOTE — Telephone Encounter (Signed)
-----   Message from Sharee Pimple, RN sent at 11/02/2014  2:54 PM EDT ----- Regarding: Schedule   ----- Message -----    From: Lars Mage, PA-C    Sent: 11/02/2014  12:25 PM      To: [facility] Charge Pool  CEA left  F/U with Dr. Darrick Penna in 2 weeks

## 2014-11-04 NOTE — Telephone Encounter (Signed)
Pt was on tcm d/c 11/03/14 pt lives in assisted living facility and he will see Dr. Darrick Penna in 2 wks for follow-up...[name]

## 2014-11-06 ENCOUNTER — Telehealth: Payer: Self-pay

## 2014-11-06 NOTE — Telephone Encounter (Signed)
Phone call to [facility].  Informed of order to D/C ASA per K. Trinh, PA.  Verb. Understanding.  Will fax order to [phone].

## 2014-11-06 NOTE — Telephone Encounter (Signed)
-----   Message from Raymond Gurney, PA-C sent at 11/06/2014  9:11 AM EDT ----- Regarding: RE: medication clarification He can stop his ASA.   Thanks Kim  ----- Message -----    From: Phillips Odor, RN    Sent: 11/05/2014   5:13 PM      To: Raymond Gurney, PA-C Subject: medication clarification                       Please review medications from discharge; the nurse @ [facility] wanted me to clarify if he needs to continue ASA 81 mg, since he resumed the Xarelto 15 mg qd.  Thanks.

## 2014-11-16 ENCOUNTER — Emergency Department (HOSPITAL_COMMUNITY)
Admission: EM | Admit: 2014-11-16 | Discharge: 2014-11-16 | Disposition: A | Discharge status: Nursing Home (Includes ICF) | Payer: Medicare Other | Source: Home / Self Care | Attending: Family Medicine | Admitting: Family Medicine

## 2014-11-16 ENCOUNTER — Inpatient Hospital Stay (HOSPITAL_COMMUNITY)
Admission: EM | Admit: 2014-11-16 | Discharge: 2014-11-19 | DRG: 293 | Disposition: A | Discharge status: Nursing Home (Includes ICF) | Payer: Medicare Other | Attending: Internal Medicine | Admitting: Internal Medicine

## 2014-11-16 ENCOUNTER — Encounter (HOSPITAL_COMMUNITY): Payer: Self-pay | Admitting: Emergency Medicine

## 2014-11-16 ENCOUNTER — Encounter (HOSPITAL_COMMUNITY): Payer: Self-pay | Admitting: *Deleted

## 2014-11-16 ENCOUNTER — Emergency Department (HOSPITAL_COMMUNITY): Payer: Medicare Other

## 2014-11-16 DIAGNOSIS — R0989 Other specified symptoms and signs involving the circulatory and respiratory systems: Secondary | ICD-10-CM | POA: Diagnosis not present

## 2014-11-16 DIAGNOSIS — R600 Localized edema: Secondary | ICD-10-CM

## 2014-11-16 DIAGNOSIS — R0609 Other forms of dyspnea: Secondary | ICD-10-CM

## 2014-11-16 DIAGNOSIS — N183 Chronic kidney disease, stage 3 unspecified: Secondary | ICD-10-CM | POA: Diagnosis present

## 2014-11-16 DIAGNOSIS — I129 Hypertensive chronic kidney disease with stage 1 through stage 4 chronic kidney disease, or unspecified chronic kidney disease: Secondary | ICD-10-CM | POA: Diagnosis present

## 2014-11-16 DIAGNOSIS — F4310 Post-traumatic stress disorder, unspecified: Secondary | ICD-10-CM | POA: Diagnosis present

## 2014-11-16 DIAGNOSIS — Z8673 Personal history of transient ischemic attack (TIA), and cerebral infarction without residual deficits: Secondary | ICD-10-CM | POA: Diagnosis not present

## 2014-11-16 DIAGNOSIS — I252 Old myocardial infarction: Secondary | ICD-10-CM | POA: Diagnosis not present

## 2014-11-16 DIAGNOSIS — Z955 Presence of coronary angioplasty implant and graft: Secondary | ICD-10-CM

## 2014-11-16 DIAGNOSIS — Z7901 Long term (current) use of anticoagulants: Secondary | ICD-10-CM

## 2014-11-16 DIAGNOSIS — Z803 Family history of malignant neoplasm of breast: Secondary | ICD-10-CM | POA: Diagnosis not present

## 2014-11-16 DIAGNOSIS — J811 Chronic pulmonary edema: Secondary | ICD-10-CM | POA: Diagnosis not present

## 2014-11-16 DIAGNOSIS — Z8249 Family history of ischemic heart disease and other diseases of the circulatory system: Secondary | ICD-10-CM | POA: Diagnosis not present

## 2014-11-16 DIAGNOSIS — Z86711 Personal history of pulmonary embolism: Secondary | ICD-10-CM | POA: Diagnosis not present

## 2014-11-16 DIAGNOSIS — I10 Essential (primary) hypertension: Secondary | ICD-10-CM | POA: Diagnosis not present

## 2014-11-16 DIAGNOSIS — E785 Hyperlipidemia, unspecified: Secondary | ICD-10-CM | POA: Diagnosis not present

## 2014-11-16 DIAGNOSIS — R0600 Dyspnea, unspecified: Secondary | ICD-10-CM | POA: Diagnosis not present

## 2014-11-16 DIAGNOSIS — T502X5A Adverse effect of carbonic-anhydrase inhibitors, benzothiadiazides and other diuretics, initial encounter: Secondary | ICD-10-CM | POA: Diagnosis not present

## 2014-11-16 DIAGNOSIS — K219 Gastro-esophageal reflux disease without esophagitis: Secondary | ICD-10-CM | POA: Diagnosis present

## 2014-11-16 DIAGNOSIS — R609 Edema, unspecified: Secondary | ICD-10-CM | POA: Diagnosis not present

## 2014-11-16 DIAGNOSIS — I5033 Acute on chronic diastolic (congestive) heart failure: Principal | ICD-10-CM | POA: Diagnosis present

## 2014-11-16 DIAGNOSIS — I480 Paroxysmal atrial fibrillation: Secondary | ICD-10-CM | POA: Diagnosis not present

## 2014-11-16 DIAGNOSIS — Z86718 Personal history of other venous thrombosis and embolism: Secondary | ICD-10-CM

## 2014-11-16 DIAGNOSIS — Z7952 Long term (current) use of systemic steroids: Secondary | ICD-10-CM | POA: Diagnosis not present

## 2014-11-16 DIAGNOSIS — M7989 Other specified soft tissue disorders: Secondary | ICD-10-CM | POA: Diagnosis not present

## 2014-11-16 DIAGNOSIS — I2510 Atherosclerotic heart disease of native coronary artery without angina pectoris: Secondary | ICD-10-CM | POA: Diagnosis not present

## 2014-11-16 DIAGNOSIS — I509 Heart failure, unspecified: Secondary | ICD-10-CM

## 2014-11-16 DIAGNOSIS — I5031 Acute diastolic (congestive) heart failure: Secondary | ICD-10-CM | POA: Diagnosis not present

## 2014-11-16 DIAGNOSIS — I5021 Acute systolic (congestive) heart failure: Secondary | ICD-10-CM

## 2014-11-16 DIAGNOSIS — R030 Elevated blood-pressure reading, without diagnosis of hypertension: Secondary | ICD-10-CM | POA: Diagnosis not present

## 2014-11-16 LAB — BASIC METABOLIC PANEL
ANION GAP: 8 (ref 5–15)
BUN: 31 mg/dL — ABNORMAL HIGH (ref 6–20)
CALCIUM: 8.7 mg/dL — AB (ref 8.9–10.3)
CO2: 24 mmol/L (ref 22–32)
Chloride: 106 mmol/L (ref 101–111)
Creatinine, Ser: 1.3 mg/dL — ABNORMAL HIGH (ref 0.61–1.24)
GFR, EST AFRICAN AMERICAN: 53 mL/min — AB (ref >60)
GFR, EST NON AFRICAN AMERICAN: 46 mL/min — AB (ref >60)
Glucose, Bld: 107 mg/dL — ABNORMAL HIGH (ref 65–99)
POTASSIUM: 4.5 mmol/L (ref 3.5–5.1)
Sodium: 138 mmol/L (ref 135–145)

## 2014-11-16 LAB — CBC WITH DIFFERENTIAL/PLATELET
BASOS ABS: 0 10*3/uL (ref 0.0–0.1)
BASOS PCT: 0 % (ref 0–1)
EOS PCT: 1 % (ref 0–5)
Eosinophils Absolute: 0.1 10*3/uL (ref 0.0–0.7)
HEMATOCRIT: 39.5 % (ref 39.0–52.0)
Hemoglobin: 13.1 g/dL (ref 13.0–17.0)
LYMPHS PCT: 18 % (ref 12–46)
Lymphs Abs: 1.4 10*3/uL (ref 0.7–4.0)
MCH: 32.1 pg (ref 26.0–34.0)
MCHC: 33.2 g/dL (ref 30.0–36.0)
MCV: 96.8 fL (ref 78.0–100.0)
MONO ABS: 0.6 10*3/uL (ref 0.1–1.0)
Monocytes Relative: 8 % (ref 3–12)
NEUTROS ABS: 5.8 10*3/uL (ref 1.7–7.7)
Neutrophils Relative %: 73 % (ref 43–77)
PLATELETS: 247 10*3/uL (ref 150–400)
RBC: 4.08 MIL/uL — AB (ref 4.22–5.81)
RDW: 14.9 % (ref 11.5–15.5)
WBC: 7.9 10*3/uL (ref 4.0–10.5)

## 2014-11-16 LAB — BRAIN NATRIURETIC PEPTIDE: B Natriuretic Peptide: 598.4 pg/mL — ABNORMAL HIGH (ref 0.0–100.0)

## 2014-11-16 LAB — TSH: TSH: 1.574 u[IU]/mL (ref 0.350–4.500)

## 2014-11-16 MED ORDER — FUROSEMIDE 10 MG/ML IJ SOLN
20.0000 mg | Freq: Once | INTRAMUSCULAR | Status: AC
  Administered 2014-11-16: 20 mg via INTRAVENOUS
  Filled 2014-11-16: qty 2

## 2014-11-16 NOTE — ED Notes (Signed)
Pt ambulated around the unit. Resting O2 was 97%. Towards the end of ambulation pt O2 dropped to 86%. Upon resting again O2 rebounding to 96%.

## 2014-11-16 NOTE — ED Provider Notes (Signed)
CSN: [id number]     Arrival date & time 11/16/14  1838 History   First MD Initiated Contact with Patient 11/16/14 1902     Chief Complaint  Patient presents with  . Leg Swelling     (Consider location/radiation/quality/duration/timing/severity/associated sxs/prior Treatment) HPI 81 year old male who presents with leg swelling. He has a history of CAD status post stents, prior CVA with no residual deficits, history of DVT and PE on Xarelto, and carotid artery stenosis status post left carotid endarterectomy on 11/02/2014. He was briefly taken off his Xarelto, but has recently started back on this medication. He has noted increasing swelling over his lower extremities right greater than left over the course of the past 4-5 days. He has had mild dyspnea on exertion, doing daily activities at home, but denies any orthopnea or PND. He has not had any associating chest pain, cough, difficulty breathing, decreased urine output. Of note, he had recent echocardiogram March 2016 revealing EF of 55%.   Past Medical History  Diagnosis Date  . PVD (peripheral vascular disease)   . CAD (coronary artery disease)     a. 12/2005 - PCI to OM  . Hyperlipidemia   . Cerebrovascular accident   . DVT femoral (deep venous thrombosis) with thrombophlebitis 03/2007    [name] 07/13/2010  . GERD (gastroesophageal reflux disease)   . Edema   . Anxiety   . Vertigo   . IBS (irritable bowel syndrome)   . Duodenitis   . Gastritis   . Esophageal stricture   . Cellulitis   . Pulmonary embolism   . Prostatic hypertrophy   . Frequent falls   . Subdural hematoma   . PTSD (post-traumatic stress disorder)   . Vertigo   . TIA (transient ischemic attack)     a. 01/2006 and 05/2006.   [name] Hiatal hernia     periferal neuropathy  . Hx of echocardiogram     Echo 9/13:  Mild LVH, EF 60-65%, Gr 1 diast dysfn, mild AI, mild BAE  . Gout   . Chronic low back pain   . Gait disorder   . Peripheral neuropathy   . Hydrocele   .  History of shingles     Left lumbar  . HOH (hard of hearing)     hearing aid, totally in R ear ( no hearing aid in L )   . Myocardial infarction "years ago"    "dr said I'd had a small one"  . Daily headache   . Depression   . Urinary frequency   . Blind left eye   . Hypertension   . Dysrhythmia   . Arthritis    Past Surgical History  Procedure Laterality Date  . Hand surgery Right     "replaced bone in my finger"  . Angioplasty    . Carotid endarterectomy Left   . Coronary angioplasty with stent placement      left circumflex  . Cataract extraction, bilateral    . Tonsillectomy    . Tympanic membrane repair Right 1978    [name] 07/27/2010; "couldn'getting so t couldn't hear; had noise in my ear; didn't correct either  . Hand contracture release Right     palm/notes 07/27/2010  . Esophagogastroduodenoscopy (egd) with esophageal dilation    . Insertion of vena cava filter  2013    h/o PE  . Endarterectomy Left 11/02/2014    Procedure: LEFT  CAROTID ARTERY ENDARTERECTOMY  WITH DACRON PATCH ANGIOPLASTY;  Surgeon: Sherren Kerns, MD;  Location: [facility] OR;  Service: Vascular;  Laterality: Left;   Family History  Problem Relation Age of Onset  . Colon cancer Neg Hx   . Coronary artery disease Brother   . Heart attack Father   . Migraines Mother   . Breast cancer Sister   . Dementia Sister   . Renal Disease Brother    Social History  Substance Use Topics  . Smoking status: Never Smoker   . Smokeless tobacco: Never Used  . Alcohol Use: 0.0 oz/week    0 Standard drinks or equivalent per week     Comment: wine occasionally    Review of Systems 10/14 systems reviewed and are negative other than those stated in the HPI   Allergies  Ativan; Augmentin; Fludrocortisone acetate; and Scopace  Home Medications   Prior to Admission medications   Medication Sig Start Date End Date Taking? Authorizing Provider  acetaminophen (TYLENOL) 500 MG tablet Take 2 tablets (1,000 mg  total) by mouth 2 (two) times daily. 01/13/14  Yes Donita Brooks, MD  acetaminophen (TYLENOL) 500 MG tablet Take 500 mg by mouth every 4 (four) hours as needed for fever.   Yes Historical Provider, MD  ALPRAZolam (XANAX) 0.25 MG tablet Take 1 tablet (0.25 mg total) by mouth 2 (two) times daily as needed for anxiety. 05/26/14  Yes Elease Etienne, MD  alum & mag hydroxide-simeth (MAALOX/MYLANTA) 200-200-20 MG/5ML suspension Take 30 mLs by mouth every 6 (six) hours as needed for indigestion or heartburn.   Yes Historical Provider, MD  amLODipine (NORVASC) 10 MG tablet Take 1 tablet (10 mg total) by mouth daily. 10/27/14  Yes Tonny Bollman, MD  DEXILANT 60 MG capsule TAKE ONE CAPSULE BY MOUTH EVERY DAY 03/16/14  Yes Donita Brooks, MD  docusate sodium (COLACE) 100 MG capsule Take 1 capsule (100 mg total) by mouth at bedtime. 07/14/14  Yes Donita Brooks, MD  DULoxetine (CYMBALTA) 30 MG capsule TAKE ONE CAPSULE BY MOUTH EVERY DAY 09/07/14  Yes York Spaniel, MD  HYDROcodone-acetaminophen (NORCO) 5-325 MG per tablet Take 1 tablet by mouth every 6 (six) hours as needed for moderate pain or severe pain. 11/03/14  Yes Kimberly A Trinh, PA-C  latanoprost (XALATAN) 0.005 % ophthalmic solution Place 1 drop into both eyes at bedtime.   Yes Historical Provider, MD  loperamide (IMODIUM) 2 MG capsule Take 2 mg by mouth as needed for diarrhea or loose stools (Not to exceed 8 doses in 24 hours).   Yes Historical Provider, MD  magnesium hydroxide (MILK OF MAGNESIA) 400 MG/5ML suspension Take 30 mLs by mouth at bedtime as needed for mild constipation.   Yes Historical Provider, MD  MYRBETRIQ 25 MG TB24 tablet TAKE 1 TABLET BY MOUTH EVERY DAY 07/13/14  Yes Donita Brooks, MD  nitroGLYCERIN (NITROSTAT) 0.4 MG SL tablet Place 0.4 mg under the tongue every 5 (five) minutes as needed. Chest pain   Yes Historical Provider, MD  ondansetron (ZOFRAN) 4 MG tablet Take 4 mg by mouth every 8 (eight) hours as needed for nausea.    Yes Historical Provider, MD  oxyCODONE-acetaminophen (PERCOCET/ROXICET) 5-325 MG per tablet Take 1 tablet by mouth every 6 (six) hours as needed for severe pain. 11/02/14  Yes Lars Mage, PA-C  polyethylene glycol powder (GLYCOLAX/MIRALAX) powder MIX 17 GRAMS WITH 4 TO 6 OUNCES OF WATER AND DRINK ONCE DAILY AS NEEDED FOR CONSTIPATION 03/07/13  Yes Donita Brooks, MD  polyvinyl alcohol (LIQUIFILM TEARS) 1.4 % ophthalmic solution  Place 2 drops into both eyes 3 (three) times daily.   Yes Historical Provider, MD  predniSONE (DELTASONE) 10 MG tablet Take 1 tablet (10 mg total) by mouth daily with breakfast. 09/22/14  Yes Donita Brooks, MD  simvastatin (ZOCOR) 10 MG tablet Take 1 tablet (10 mg total) by mouth daily at 6 PM. 11/03/14  Yes Raymond Gurney, PA-C  tamsulosin (FLOMAX) 0.4 MG CAPS capsule Take 0.4 mg by mouth daily.   Yes Historical Provider, MD  XARELTO 15 MG TABS tablet TAKE 1 TABLET BY MOUTH EVERY DAY WITH SUPPER 10/06/14  Yes Tonny Bollman, MD  diclofenac sodium (VOLTAREN) 1 % GEL Apply 2 g topically 4 (four) times daily. Patient not taking: Reported on 11/16/2014 06/22/14   Donita Brooks, MD  meclizine (ANTIVERT) 12.5 MG tablet Take 12.5 mg by mouth every 12 (twelve) hours as needed for dizziness.    Historical Provider, MD  mupirocin ointment (BACTROBAN) 2 % 1 Application, nasally, 2 times daily for 5 days prior to surgery Patient not taking: Reported on 11/16/2014 10/30/14   Sherren Kerns, MD  promethazine (PHENERGAN) 25 MG tablet TAKE 1 TABLET BY MOUTH EVERY 6 HOURS AS NEEDED FOR NAUSEA Patient not taking: Reported on 11/16/2014 07/27/14   Donita Brooks, MD   BP 140/60 mmHg  Pulse 65  Temp(Src) 98 F (36.7 C) (Oral)  Resp 24  Ht 6' (1.829 m)  Wt 195 lb (88.451 kg)  BMI 26.44 kg/m2  SpO2 97% Physical Exam Physical Exam  Nursing note and vitals reviewed. Constitutional: elderly man, appearing well developed, well nourished, non-toxic, and in no acute distress Head:  Normocephalic and atraumatic.  Mouth/Throat: Oropharynx is clear and moist.  Neck: Normal range of motion. Neck supple.  Cardiovascular: Normal rate and regular rhythm.  +2 pitting edema bilaterally, right greater than left, no appreciable JVD Pulmonary/Chest: Effort normal. Clear to ausculation bilaterally. No conversational dyspnea. Abdominal: Soft. Obese and mildly distended.  There is no tenderness. There is no rebound and no guarding.  Musculoskeletal: Normal range of motion.  Neurological: Alert, no facial droop, fluent speech, moves all extremities symmetrically Skin: Skin is warm and dry.  Psychiatric: Cooperative  ED Course  Procedures (including critical care time) Labs Review Labs Reviewed  CBC WITH DIFFERENTIAL/PLATELET - Abnormal; Notable for the following:    RBC 4.08 (*)    All other components within normal limits  BASIC METABOLIC PANEL - Abnormal; Notable for the following:    Glucose, Bld 107 (*)    BUN 31 (*)    Creatinine, Ser 1.30 (*)    Calcium 8.7 (*)    GFR calc non Af Amer 46 (*)    GFR calc Af Amer 53 (*)    All other components within normal limits  BRAIN NATRIURETIC PEPTIDE - Abnormal; Notable for the following:    B Natriuretic Peptide 598.4 (*)    All other components within normal limits  TSH    Imaging Review Dg Chest 2 View  11/16/2014   CLINICAL DATA:  Bilateral edema and leg swelling the past 3 days  EXAM: CHEST  2 VIEW  COMPARISON:  Radiograph 05/22/2014  FINDINGS: Stable enlarged cardiac silhouette. There is central venous pulmonary congestion similar comparison exam. No focal infiltrate. No overt pulmonary edema. No pneumothorax.  IMPRESSION: Cardiomegaly and central venous congestion.   Electronically Signed   By: Genevive Bi M.D.   On: 11/16/2014 20:55   I have personally reviewed and evaluated these images and lab  results as part of my medical decision-making.   EKG Interpretation   Date/Time:  Monday November 16 2014 19:46:12  EDT Ventricular Rate:  71 PR Interval:  163 QRS Duration: 142 QT Interval:  465 QTC Calculation: 505 R Axis:   52 Text Interpretation:  Sinus rhythm Probable left atrial enlargement Right  bundle branch block No significant change since last tracing Confirmed by  Errica Dutil MD, Annabelle Harman ([id number]) on 11/17/2014 1:40:17 AM      MDM   Final diagnoses:  Pulmonary vascular congestion  Dyspnea on exertion  Bilateral edema of lower extremity    81 year old male with history of CAD status post stents, thromboembolism on Xarelto, and carotid artery stenosis status post left endarterectomy who presents with significant lower extremity edema and dyspnea on exertion. He is nontoxic and in no acute distress on my evaluation. He is breathing comfortably on room air with normal oxygenation and no conversational dyspnea. Remainder of vital signs are non-concerning. Lungs are clear to auscultation bilaterally, but he has significant lower extremity edema. Felt like felt less likely to be secondary to thromboembolism, as he is taking Xarelto. No evidence of severe kidney dysfunction. There is pulmonary vascular congestion on x-ray, and mildly elevated BNP that may be suggestive of heart failure. He is given 20 mg of IV Lasix, and has a good urine output. However with ambulation, he desaturates to 86%, but does not appear significantly symptomatic from this. He is admitted for ongoing diuresis and further work-up.   Lavera Guise, MD 11/17/14 [phone]

## 2014-11-16 NOTE — ED Notes (Signed)
Pt arrives from urgent care via [facility]. Pt is a resident of [address] and sought care rt leg swelling x5 days. Pt has pitting edema +3 in lower legs. Pt also appears to have abdominal distension. Pt states he is unsure wether or not he currently takes a diuretic.

## 2014-11-16 NOTE — ED Notes (Signed)
Resting SpO2 -94% Ambulated patient around unit SpO2 dropped to 90% Patient SpO2 rebounded to 94% once stopped. Pt. Returned to bed, side rails raised, family at bedside.

## 2014-11-16 NOTE — ED Notes (Signed)
Notified [facility] 

## 2014-11-16 NOTE — ED Provider Notes (Addendum)
CSN: [id number]     Arrival date & time 11/16/14  1711 History   First MD Initiated Contact with Patient 11/16/14 1740     Chief Complaint  Patient presents with  . Foot Swelling   (Consider location/radiation/quality/duration/timing/severity/associated sxs/prior Treatment) Patient is a 81 y.o. male presenting with general illness. The history is provided by the patient and a relative.  Illness Location:  Bilat lower ext swelling worse since fri. Onset quality:  Gradual Duration:  4 days Progression:  Worsening Chronicity:  Chronic Context:  Lower legs sl swollen chronic but worse last sev days. Ineffective treatments:  No rx. Associated symptoms: no abdominal pain, no chest pain, no cough, no fever, no shortness of breath and no wheezing   Risk factors:  S/p left carotid endart on 8/22 dr fields.   Past Medical History  Diagnosis Date  . PVD (peripheral vascular disease)   . CAD (coronary artery disease)     a. 12/2005 - PCI to OM  . Hyperlipidemia   . Cerebrovascular accident   . DVT femoral (deep venous thrombosis) with thrombophlebitis 03/2007    [name] 07/13/2010  . GERD (gastroesophageal reflux disease)   . Edema   . Anxiety   . Vertigo   . IBS (irritable bowel syndrome)   . Duodenitis   . Gastritis   . Esophageal stricture   . Cellulitis   . Pulmonary embolism   . Prostatic hypertrophy   . Frequent falls   . Subdural hematoma   . PTSD (post-traumatic stress disorder)   . Vertigo   . TIA (transient ischemic attack)     a. 01/2006 and 05/2006.   [name] Hiatal hernia     periferal neuropathy  . Hx of echocardiogram     Echo 9/13:  Mild LVH, EF 60-65%, Gr 1 diast dysfn, mild AI, mild BAE  . Gout   . Chronic low back pain   . Gait disorder   . Peripheral neuropathy   . Hydrocele   . History of shingles     Left lumbar  . HOH (hard of hearing)     hearing aid, totally in R ear ( no hearing aid in L )   . Myocardial infarction "years ago"    "dr said I'd had a small  one"  . Daily headache   . Depression   . Urinary frequency   . Blind left eye   . Hypertension   . Dysrhythmia   . Arthritis    Past Surgical History  Procedure Laterality Date  . Hand surgery Right     "replaced bone in my finger"  . Angioplasty    . Carotid endarterectomy Left   . Coronary angioplasty with stent placement      left circumflex  . Cataract extraction, bilateral    . Tonsillectomy    . Tympanic membrane repair Right 1978    [name] 07/27/2010; "couldn'getting so t couldn't hear; had noise in my ear; didn't correct either  . Hand contracture release Right     palm/notes 07/27/2010  . Esophagogastroduodenoscopy (egd) with esophageal dilation    . Insertion of vena cava filter  2013    h/o PE  . Endarterectomy Left 11/02/2014    Procedure: LEFT  CAROTID ARTERY ENDARTERECTOMY  WITH DACRON PATCH ANGIOPLASTY;  Surgeon: Sherren Kerns, MD;  Location: [facility] OR;  Service: Vascular;  Laterality: Left;   Family History  Problem Relation Age of Onset  . Colon cancer Neg Hx   . Coronary  artery disease Brother   . Heart attack Father   . Migraines Mother   . Breast cancer Sister   . Dementia Sister   . Renal Disease Brother    Social History  Substance Use Topics  . Smoking status: Never Smoker   . Smokeless tobacco: Never Used  . Alcohol Use: 0.0 oz/week    0 Standard drinks or equivalent per week     Comment: wine occasionally    Review of Systems  Constitutional: Negative for fever.  Respiratory: Negative for cough, shortness of breath and wheezing.   Cardiovascular: Positive for leg swelling. Negative for chest pain and palpitations.  Gastrointestinal: Negative for abdominal pain.  All other systems reviewed and are negative.   Allergies  Ativan; Augmentin; Fludrocortisone acetate; and Scopace  Home Medications   Prior to Admission medications   Medication Sig Start Date End Date Taking? Authorizing Provider  acetaminophen (TYLENOL) 500 MG tablet Take  2 tablets (1,000 mg total) by mouth 2 (two) times daily. 01/13/14   Donita Brooks, MD  ALPRAZolam Prudy Feeler) 0.25 MG tablet Take 1 tablet (0.25 mg total) by mouth 2 (two) times daily as needed for anxiety. 05/26/14   Elease Etienne, MD  amLODipine (NORVASC) 10 MG tablet Take 1 tablet (10 mg total) by mouth daily. 10/27/14   Tonny Bollman, MD  aspirin EC 81 MG EC tablet Take 1 tablet (81 mg total) by mouth daily. 11/03/14   Raymond Gurney, PA-C  Carboxymethylcellul-Glycerin (CLEAR EYES FOR DRY EYES OP) Apply 1 drop to eye daily. Mid-day    Historical Provider, MD  DEXILANT 60 MG capsule TAKE ONE CAPSULE BY MOUTH EVERY DAY 03/16/14   Donita Brooks, MD  diclofenac sodium (VOLTAREN) 1 % GEL Apply 2 g topically 4 (four) times daily. 06/22/14   Donita Brooks, MD  docusate sodium (COLACE) 100 MG capsule Take 1 capsule (100 mg total) by mouth at bedtime. 07/14/14   Donita Brooks, MD  DULoxetine (CYMBALTA) 30 MG capsule TAKE ONE CAPSULE BY MOUTH EVERY DAY 09/07/14   York Spaniel, MD  HYDROcodone-acetaminophen [facility]) 5-325 MG per tablet Take 1 tablet by mouth every 6 (six) hours as needed for moderate pain or severe pain. 11/03/14   Raymond Gurney, PA-C  latanoprost (XALATAN) 0.005 % ophthalmic solution Place 1 drop into both eyes at bedtime.    Historical Provider, MD  meclizine (ANTIVERT) 12.5 MG tablet Take 12.5 mg by mouth every 12 (twelve) hours as needed for dizziness.    Historical Provider, MD  mupirocin ointment (BACTROBAN) 2 % 1 Application, nasally, 2 times daily for 5 days prior to surgery 10/30/14   Sherren Kerns, MD  MYRBETRIQ 25 MG TB24 tablet TAKE 1 TABLET BY MOUTH EVERY DAY 07/13/14   Donita Brooks, MD  nitroGLYCERIN (NITROSTAT) 0.4 MG SL tablet Place 0.4 mg under the tongue every 5 (five) minutes as needed. Chest pain    Historical Provider, MD  ondansetron (ZOFRAN) 4 MG tablet Take 4 mg by mouth every 8 (eight) hours as needed for nausea.    Historical Provider, MD   oxyCODONE-acetaminophen (PERCOCET/ROXICET) 5-325 MG per tablet Take 1 tablet by mouth every 6 (six) hours as needed for severe pain. 11/02/14   Lars Mage, PA-C  polyethylene glycol powder (GLYCOLAX/MIRALAX) powder MIX 17 GRAMS WITH 4 TO 6 OUNCES OF WATER AND DRINK ONCE DAILY AS NEEDED FOR CONSTIPATION 03/07/13   Donita Brooks, MD  predniSONE (DELTASONE) 10 MG tablet Take  1 tablet (10 mg total) by mouth daily with breakfast. 09/22/14   Donita Brooks, MD  promethazine (PHENERGAN) 25 MG tablet TAKE 1 TABLET BY MOUTH EVERY 6 HOURS AS NEEDED FOR NAUSEA 07/27/14   Donita Brooks, MD  simvastatin (ZOCOR) 10 MG tablet Take 1 tablet (10 mg total) by mouth daily at 6 PM. 11/03/14   Raymond Gurney, PA-C  tamsulosin (FLOMAX) 0.4 MG CAPS capsule Take 0.4 mg by mouth daily.    Historical Provider, MD  XARELTO 15 MG TABS tablet TAKE 1 TABLET BY MOUTH EVERY DAY WITH SUPPER 10/06/14   Tonny Bollman, MD  zolpidem (AMBIEN) 5 MG tablet Take 5 mg by mouth at bedtime as needed for sleep.    Historical Provider, MD   Meds Ordered and Administered this Visit  Medications - No data to display  BP 165/76 mmHg  Pulse 67  Temp(Src) 98.1 F (36.7 C) (Oral)  Resp 16  SpO2 95% No data found.   Physical Exam  Constitutional: He is oriented to person, place, and time. He appears well-developed and well-nourished. No distress.  Neck:  Left neck surg scar  Cardiovascular: Regular rhythm and normal heart sounds.   Pulmonary/Chest: Effort normal. He has rales.  Musculoskeletal: He exhibits edema.  bilat pitting edema to knees, stasis derm.  Lymphadenopathy:    He has no cervical adenopathy.  Neurological: He is alert and oriented to person, place, and time.  Skin: Skin is warm and dry.  Nursing note and vitals reviewed.   ED Course  Procedures (including critical care time)  Labs Review Labs Reviewed - No data to display  Imaging Review No results found.   Visual Acuity Review  Right Eye  Distance:   Left Eye Distance:   Bilateral Distance:    Right Eye Near:   Left Eye Near:    Bilateral Near:         MDM   1. CHF (congestive heart failure), NYHA class I, acute, systolic    Sent for cardiac eval of worsening lower ext edema, bibasilar rales. S/p left carotid endarter on 8/22.    Linna Hoff, MD 11/16/14 1610  Linna Hoff, MD 11/16/14 [phone]

## 2014-11-16 NOTE — ED Notes (Signed)
Feet and ankle swell some as a baseline.  The current swelling started one week ago and has continued to worsen.  Swelling goes to just below knee.  No chest pain

## 2014-11-16 NOTE — ED Notes (Signed)
No [employer] truck 

## 2014-11-17 ENCOUNTER — Encounter (HOSPITAL_COMMUNITY): Payer: Self-pay | Admitting: Internal Medicine

## 2014-11-17 ENCOUNTER — Inpatient Hospital Stay (HOSPITAL_COMMUNITY): Payer: Medicare Other

## 2014-11-17 DIAGNOSIS — I252 Old myocardial infarction: Secondary | ICD-10-CM | POA: Diagnosis not present

## 2014-11-17 DIAGNOSIS — Z7901 Long term (current) use of anticoagulants: Secondary | ICD-10-CM | POA: Diagnosis not present

## 2014-11-17 DIAGNOSIS — R0600 Dyspnea, unspecified: Secondary | ICD-10-CM

## 2014-11-17 DIAGNOSIS — E785 Hyperlipidemia, unspecified: Secondary | ICD-10-CM | POA: Diagnosis present

## 2014-11-17 DIAGNOSIS — I509 Heart failure, unspecified: Secondary | ICD-10-CM

## 2014-11-17 DIAGNOSIS — I129 Hypertensive chronic kidney disease with stage 1 through stage 4 chronic kidney disease, or unspecified chronic kidney disease: Secondary | ICD-10-CM | POA: Diagnosis present

## 2014-11-17 DIAGNOSIS — M7989 Other specified soft tissue disorders: Secondary | ICD-10-CM | POA: Diagnosis not present

## 2014-11-17 DIAGNOSIS — R600 Localized edema: Secondary | ICD-10-CM

## 2014-11-17 DIAGNOSIS — Z7952 Long term (current) use of systemic steroids: Secondary | ICD-10-CM | POA: Diagnosis not present

## 2014-11-17 DIAGNOSIS — Z8673 Personal history of transient ischemic attack (TIA), and cerebral infarction without residual deficits: Secondary | ICD-10-CM | POA: Diagnosis not present

## 2014-11-17 DIAGNOSIS — I5033 Acute on chronic diastolic (congestive) heart failure: Secondary | ICD-10-CM | POA: Diagnosis present

## 2014-11-17 DIAGNOSIS — Z86711 Personal history of pulmonary embolism: Secondary | ICD-10-CM | POA: Diagnosis not present

## 2014-11-17 DIAGNOSIS — R0989 Other specified symptoms and signs involving the circulatory and respiratory systems: Secondary | ICD-10-CM | POA: Insufficient documentation

## 2014-11-17 DIAGNOSIS — Z955 Presence of coronary angioplasty implant and graft: Secondary | ICD-10-CM | POA: Diagnosis not present

## 2014-11-17 DIAGNOSIS — N183 Chronic kidney disease, stage 3 (moderate): Secondary | ICD-10-CM | POA: Diagnosis not present

## 2014-11-17 DIAGNOSIS — Z86718 Personal history of other venous thrombosis and embolism: Secondary | ICD-10-CM | POA: Diagnosis not present

## 2014-11-17 DIAGNOSIS — T502X5A Adverse effect of carbonic-anhydrase inhibitors, benzothiadiazides and other diuretics, initial encounter: Secondary | ICD-10-CM | POA: Diagnosis present

## 2014-11-17 DIAGNOSIS — Z803 Family history of malignant neoplasm of breast: Secondary | ICD-10-CM | POA: Diagnosis not present

## 2014-11-17 DIAGNOSIS — F4310 Post-traumatic stress disorder, unspecified: Secondary | ICD-10-CM | POA: Diagnosis present

## 2014-11-17 DIAGNOSIS — I10 Essential (primary) hypertension: Secondary | ICD-10-CM | POA: Diagnosis not present

## 2014-11-17 DIAGNOSIS — I2510 Atherosclerotic heart disease of native coronary artery without angina pectoris: Secondary | ICD-10-CM | POA: Diagnosis present

## 2014-11-17 DIAGNOSIS — K219 Gastro-esophageal reflux disease without esophagitis: Secondary | ICD-10-CM | POA: Diagnosis present

## 2014-11-17 DIAGNOSIS — I5031 Acute diastolic (congestive) heart failure: Secondary | ICD-10-CM | POA: Diagnosis not present

## 2014-11-17 DIAGNOSIS — I480 Paroxysmal atrial fibrillation: Secondary | ICD-10-CM | POA: Diagnosis not present

## 2014-11-17 DIAGNOSIS — Z8249 Family history of ischemic heart disease and other diseases of the circulatory system: Secondary | ICD-10-CM | POA: Diagnosis not present

## 2014-11-17 LAB — COMPREHENSIVE METABOLIC PANEL
ALK PHOS: 52 U/L (ref 38–126)
ALT: 15 U/L — ABNORMAL LOW (ref 17–63)
AST: 18 U/L (ref 15–41)
Albumin: 3.2 g/dL — ABNORMAL LOW (ref 3.5–5.0)
Anion gap: 8 (ref 5–15)
BUN: 24 mg/dL — AB (ref 6–20)
CALCIUM: 8.7 mg/dL — AB (ref 8.9–10.3)
CO2: 26 mmol/L (ref 22–32)
CREATININE: 1.37 mg/dL — AB (ref 0.61–1.24)
Chloride: 105 mmol/L (ref 101–111)
GFR, EST AFRICAN AMERICAN: 50 mL/min — AB (ref >60)
GFR, EST NON AFRICAN AMERICAN: 43 mL/min — AB (ref >60)
Glucose, Bld: 89 mg/dL (ref 65–99)
Potassium: 3.9 mmol/L (ref 3.5–5.1)
SODIUM: 139 mmol/L (ref 135–145)
Total Bilirubin: 0.6 mg/dL (ref 0.3–1.2)
Total Protein: 6.5 g/dL (ref 6.5–8.1)

## 2014-11-17 LAB — TROPONIN I
Troponin I: 0.05 ng/mL — ABNORMAL HIGH (ref <0.031)
Troponin I: 0.04 ng/mL — ABNORMAL HIGH (ref <0.031)
Troponin I: 0.03 ng/mL (ref <0.031)

## 2014-11-17 LAB — CBC WITH DIFFERENTIAL/PLATELET
BASOS PCT: 0 % (ref 0–1)
Basophils Absolute: 0 10*3/uL (ref 0.0–0.1)
EOS ABS: 0.2 10*3/uL (ref 0.0–0.7)
EOS PCT: 3 % (ref 0–5)
HCT: 38 % — ABNORMAL LOW (ref 39.0–52.0)
HEMOGLOBIN: 12.5 g/dL — AB (ref 13.0–17.0)
LYMPHS ABS: 2.2 10*3/uL (ref 0.7–4.0)
Lymphocytes Relative: 30 % (ref 12–46)
MCH: 31.8 pg (ref 26.0–34.0)
MCHC: 32.9 g/dL (ref 30.0–36.0)
MCV: 96.7 fL (ref 78.0–100.0)
MONOS PCT: 10 % (ref 3–12)
Monocytes Absolute: 0.7 10*3/uL (ref 0.1–1.0)
NEUTROS PCT: 57 % (ref 43–77)
Neutro Abs: 4.1 10*3/uL (ref 1.7–7.7)
PLATELETS: 246 10*3/uL (ref 150–400)
RBC: 3.93 MIL/uL — ABNORMAL LOW (ref 4.22–5.81)
RDW: 15 % (ref 11.5–15.5)
WBC: 7.3 10*3/uL (ref 4.0–10.5)

## 2014-11-17 MED ORDER — PREDNISONE 10 MG PO TABS
10.0000 mg | ORAL_TABLET | Freq: Every day | ORAL | Status: DC
  Administered 2014-11-17: 10 mg via ORAL
  Filled 2014-11-17: qty 1

## 2014-11-17 MED ORDER — ONDANSETRON HCL 4 MG PO TABS: 4.0000 mg | ORAL_TABLET | Freq: Three times a day (TID) | ORAL | Status: DC | PRN

## 2014-11-17 MED ORDER — AMLODIPINE BESYLATE 10 MG PO TABS
10.0000 mg | ORAL_TABLET | Freq: Every day | ORAL | Status: DC
  Administered 2014-11-17 – 2014-11-19 (×3): 10 mg via ORAL
  Filled 2014-11-17 (×3): qty 1

## 2014-11-17 MED ORDER — PANTOPRAZOLE SODIUM 40 MG PO TBEC
40.0000 mg | DELAYED_RELEASE_TABLET | Freq: Every day | ORAL | Status: DC
  Administered 2014-11-17 – 2014-11-19 (×3): 40 mg via ORAL
  Filled 2014-11-17 (×3): qty 1

## 2014-11-17 MED ORDER — DULOXETINE HCL 30 MG PO CPEP
30.0000 mg | ORAL_CAPSULE | Freq: Every day | ORAL | Status: DC
  Administered 2014-11-17 – 2014-11-19 (×3): 30 mg via ORAL
  Filled 2014-11-17 (×3): qty 1

## 2014-11-17 MED ORDER — ACETAMINOPHEN 325 MG PO TABS: 650.0000 mg | ORAL_TABLET | Freq: Four times a day (QID) | ORAL | Status: DC | PRN

## 2014-11-17 MED ORDER — POTASSIUM CHLORIDE CRYS ER 20 MEQ PO TBCR
20.0000 meq | EXTENDED_RELEASE_TABLET | Freq: Once | ORAL | Status: AC
  Administered 2014-11-17: 20 meq via ORAL
  Filled 2014-11-17: qty 1

## 2014-11-17 MED ORDER — MECLIZINE HCL 25 MG PO TABS: 12.5000 mg | ORAL_TABLET | Freq: Two times a day (BID) | ORAL | Status: DC | PRN

## 2014-11-17 MED ORDER — ONDANSETRON HCL 4 MG PO TABS: 4.0000 mg | ORAL_TABLET | Freq: Four times a day (QID) | ORAL | Status: DC | PRN

## 2014-11-17 MED ORDER — ACETAMINOPHEN 650 MG RE SUPP: 650.0000 mg | Freq: Four times a day (QID) | RECTAL | Status: DC | PRN

## 2014-11-17 MED ORDER — LATANOPROST 0.005 % OP SOLN
1.0000 [drp] | Freq: Every day | OPHTHALMIC | Status: DC
  Administered 2014-11-17 – 2014-11-18 (×2): 1 [drp] via OPHTHALMIC
  Filled 2014-11-17: qty 2.5

## 2014-11-17 MED ORDER — MIRABEGRON ER 25 MG PO TB24
25.0000 mg | ORAL_TABLET | Freq: Every day | ORAL | Status: DC
  Administered 2014-11-17 – 2014-11-19 (×3): 25 mg via ORAL
  Filled 2014-11-17 (×3): qty 1

## 2014-11-17 MED ORDER — TAMSULOSIN HCL 0.4 MG PO CAPS
0.4000 mg | ORAL_CAPSULE | Freq: Every day | ORAL | Status: DC
  Administered 2014-11-17 – 2014-11-19 (×3): 0.4 mg via ORAL
  Filled 2014-11-17 (×3): qty 1

## 2014-11-17 MED ORDER — NITROGLYCERIN 0.4 MG SL SUBL: 0.4000 mg | SUBLINGUAL_TABLET | SUBLINGUAL | Status: DC | PRN

## 2014-11-17 MED ORDER — FUROSEMIDE 10 MG/ML IJ SOLN
40.0000 mg | Freq: Two times a day (BID) | INTRAMUSCULAR | Status: DC
  Administered 2014-11-17 – 2014-11-18 (×3): 40 mg via INTRAVENOUS
  Filled 2014-11-17 (×3): qty 4

## 2014-11-17 MED ORDER — PREDNISONE 10 MG PO TABS
10.0000 mg | ORAL_TABLET | Freq: Every day | ORAL | Status: DC
  Administered 2014-11-18 – 2014-11-19 (×2): 10 mg via ORAL
  Filled 2014-11-17 (×3): qty 1

## 2014-11-17 MED ORDER — HYDROCODONE-ACETAMINOPHEN 5-325 MG PO TABS: 1.0000 | ORAL_TABLET | Freq: Four times a day (QID) | ORAL | Status: DC | PRN

## 2014-11-17 MED ORDER — POLYVINYL ALCOHOL 1.4 % OP SOLN
2.0000 [drp] | Freq: Three times a day (TID) | OPHTHALMIC | Status: DC
  Administered 2014-11-17 – 2014-11-19 (×6): 2 [drp] via OPHTHALMIC
  Filled 2014-11-17: qty 15

## 2014-11-17 MED ORDER — SODIUM CHLORIDE 0.9 % IJ SOLN
3.0000 mL | Freq: Two times a day (BID) | INTRAMUSCULAR | Status: DC
  Administered 2014-11-17 – 2014-11-19 (×5): 3 mL via INTRAVENOUS

## 2014-11-17 MED ORDER — ALPRAZOLAM 0.25 MG PO TABS
0.2500 mg | ORAL_TABLET | Freq: Two times a day (BID) | ORAL | Status: DC | PRN
  Administered 2014-11-17: 0.25 mg via ORAL
  Filled 2014-11-17: qty 1

## 2014-11-17 MED ORDER — RIVAROXABAN 15 MG PO TABS
15.0000 mg | ORAL_TABLET | Freq: Every day | ORAL | Status: DC
  Administered 2014-11-17 – 2014-11-18 (×2): 15 mg via ORAL
  Filled 2014-11-17 (×2): qty 1

## 2014-11-17 MED ORDER — SIMVASTATIN 10 MG PO TABS
10.0000 mg | ORAL_TABLET | Freq: Every day | ORAL | Status: DC
  Administered 2014-11-17 – 2014-11-18 (×2): 10 mg via ORAL
  Filled 2014-11-17: qty 1

## 2014-11-17 MED ORDER — DOCUSATE SODIUM 100 MG PO CAPS
100.0000 mg | ORAL_CAPSULE | Freq: Every day | ORAL | Status: DC
  Administered 2014-11-17 – 2014-11-18 (×2): 100 mg via ORAL
  Filled 2014-11-17 (×2): qty 1

## 2014-11-17 MED ORDER — ONDANSETRON HCL 4 MG/2ML IJ SOLN: 4.0000 mg | Freq: Four times a day (QID) | INTRAMUSCULAR | Status: DC | PRN

## 2014-11-17 NOTE — Progress Notes (Signed)
*  PRELIMINARY RESULTS* Vascular Ultrasound Lower extremity venous duplex has been completed.  Preliminary findings: negative for DVT in visualized veins.  Farrel Demark, RDMS, RVT  11/17/2014, 12:36 PM

## 2014-11-17 NOTE — ED Notes (Signed)
Admitting Dr at Bedside.

## 2014-11-17 NOTE — ED Notes (Signed)
Transporting patient to new room assignment. 

## 2014-11-17 NOTE — ED Notes (Signed)
Admitting MD at the bedside.  

## 2014-11-17 NOTE — H&P (Signed)
[facility] History and Physical  Jason [name] JYN:[id number] DOB: Jun 15, 1919 DOA: 11/16/2014  Referring physician: Dr. Verdie [name]. PCP: Jason Grosser, MD  Specialists: Jason [name]. Cardiologist.  Chief Complaint: Lower extremity edema.  HPI: Jason [name] is a 81 y.o. 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 Patient has been noticing increasing lower extremity edema over the last 1 week and also exertional shortness of breath. Denies any chest pain or productive cough or fever chills. Patient also has been having increasing abdominal girth. In the ER chest x-ray shows congestion and cardiomegaly. On exam patient has at least 3+ pitting edema of the lower extremities. Patient has been admitted for decompensated CHF. Patient was given Lasix 20 mg IV in the ER following which patient has at least 500 mL output.   Review of Systems: As presented in the history of presenting illness, rest negative.  Past Medical History  Diagnosis Date  . PVD (peripheral vascular disease)   . CAD (coronary artery disease)     a. 12/2005 - PCI to OM  . Hyperlipidemia   . Cerebrovascular accident   . DVT femoral (deep venous thrombosis) with thrombophlebitis 03/2007    Jason [name] 07/13/2010  . GERD (gastroesophageal reflux disease)   . Edema   . Anxiety   . Vertigo   . IBS (irritable bowel syndrome)   . Duodenitis   . Gastritis   . Esophageal stricture   . Cellulitis   . Pulmonary embolism   . Prostatic hypertrophy   . Frequent falls   . Subdural hematoma   . PTSD (post-traumatic stress disorder)   . Vertigo   . TIA (transient ischemic attack)     a. 01/2006 and 05/2006.   Jason [name] Kitchen Hiatal hernia     periferal neuropathy  . Hx of echocardiogram      Echo 9/13:  Mild LVH, EF 60-65%, Gr 1 diast dysfn, mild AI, mild BAE  . Gout   . Chronic low back pain   . Gait disorder   . Peripheral neuropathy   . Hydrocele   . History of shingles     Left lumbar  . HOH (hard of hearing)     hearing aid, totally in R ear ( no hearing aid in L )   . Myocardial infarction "years ago"    "dr said I'd had a small one"  . Daily headache   . Depression   . Urinary frequency   . Blind left eye   . Hypertension   . Dysrhythmia   . Arthritis    Past Surgical History  Procedure Laterality Date  . Hand surgery Right     "replaced bone in my finger"  . Angioplasty    . Carotid endarterectomy Left   . Coronary angioplasty with stent placement      left circumflex  . Cataract extraction, bilateral    . Tonsillectomy    . Tympanic membrane repair Right 1978    Jason [name] 07/27/2010; "couldn'getting so t couldn't hear; had noise in my ear; didn't correct either  . Hand contracture release Right     palm/notes 07/27/2010  . Esophagogastroduodenoscopy (egd) with esophageal dilation    . Insertion of vena cava filter  2013    h/o PE  . Endarterectomy  Left 11/02/2014    Procedure: LEFT  CAROTID ARTERY ENDARTERECTOMY  WITH DACRON PATCH ANGIOPLASTY;  Surgeon: Jason Kerns, MD;  Location: [facility] OR;  Service: Vascular;  Laterality: Left;   Social History:  reports that he has never smoked. He has never used smokeless tobacco. He reports that he drinks alcohol. He reports that he does not use illicit drugs. Where does patient live home. Can patient participate in ADLs? Yes.  Allergies  Allergen Reactions  . Ativan [Lorazepam] Other (See Comments)    unknown  . Augmentin [Amoxicillin-Pot Clavulanate] Nausea And Vomiting    daughter states he can tolerate Amoxicillin ok Profuse vomiting  . Fludrocortisone Acetate Other (See Comments)    "leg swelling"  . Scopace [Scopolamine] Other (See Comments)    *patch "Made him crazy" per patients daughter      Family History:  Family History  Problem Relation Age of Onset  . Colon cancer Neg Hx   . Coronary artery disease Brother   . Heart attack Father   . Migraines Mother   . Breast cancer Sister   . Dementia Sister   . Renal Disease Brother       Prior to Admission medications   Medication Sig Start Date End Date Taking? Authorizing Provider  acetaminophen (TYLENOL) 500 MG tablet Take 2 tablets (1,000 mg total) by mouth 2 (two) times daily. 01/13/14  Yes Jason Brooks, MD  acetaminophen (TYLENOL) 500 MG tablet Take 500 mg by mouth every 4 (four) hours as needed for fever.   Yes Historical Provider, MD  ALPRAZolam (XANAX) 0.25 MG tablet Take 1 tablet (0.25 mg total) by mouth 2 (two) times daily as needed for anxiety. 05/26/14  Yes Jason Etienne, MD  alum & mag hydroxide-simeth (MAALOX/MYLANTA) 200-200-20 MG/5ML suspension Take 30 mLs by mouth every 6 (six) hours as needed for indigestion or heartburn.   Yes Historical Provider, MD  amLODipine (NORVASC) 10 MG tablet Take 1 tablet (10 mg total) by mouth daily. 10/27/14  Yes Jason Bollman, MD  DEXILANT 60 MG capsule TAKE ONE CAPSULE BY MOUTH EVERY DAY 03/16/14  Yes Jason Brooks, MD  docusate sodium (COLACE) 100 MG capsule Take 1 capsule (100 mg total) by mouth at bedtime. 07/14/14  Yes Jason Brooks, MD  DULoxetine (CYMBALTA) 30 MG capsule TAKE ONE CAPSULE BY MOUTH EVERY DAY 09/07/14  Yes Jason Spaniel, MD  HYDROcodone-acetaminophen (NORCO) 5-325 MG per tablet Take 1 tablet by mouth every 6 (six) hours as needed for moderate pain or severe pain. 11/03/14  Yes Jason A Trinh, PA-C  latanoprost (XALATAN) 0.005 % ophthalmic solution Place 1 drop into both eyes at bedtime.   Yes Historical Provider, MD  loperamide (IMODIUM) 2 MG capsule Take 2 mg by mouth as needed for diarrhea or loose stools (Not to exceed 8 doses in 24 hours).   Yes Historical Provider, MD  magnesium hydroxide (MILK OF MAGNESIA) 400 MG/5ML suspension Take 30 mLs by  mouth at bedtime as needed for mild constipation.   Yes Historical Provider, MD  MYRBETRIQ 25 MG TB24 tablet TAKE 1 TABLET BY MOUTH EVERY DAY 07/13/14  Yes Jason Brooks, MD  nitroGLYCERIN (NITROSTAT) 0.4 MG SL tablet Place 0.4 mg under the tongue every 5 (five) minutes as needed. Chest pain   Yes Historical Provider, MD  ondansetron (ZOFRAN) 4 MG tablet Take 4 mg by mouth every 8 (eight) hours as needed for nausea.   Yes Historical Provider, MD  oxyCODONE-acetaminophen (PERCOCET/ROXICET) 5-325 MG  per tablet Take 1 tablet by mouth every 6 (six) hours as needed for severe pain. 11/02/14  Yes Lars Mage, PA-C  polyethylene glycol powder (GLYCOLAX/MIRALAX) powder MIX 17 GRAMS WITH 4 TO 6 OUNCES OF WATER AND DRINK ONCE DAILY AS NEEDED FOR CONSTIPATION 03/07/13  Yes Jason Brooks, MD  polyvinyl alcohol (LIQUIFILM TEARS) 1.4 % ophthalmic solution Place 2 drops into both eyes 3 (three) times daily.   Yes Historical Provider, MD  predniSONE (DELTASONE) 10 MG tablet Take 1 tablet (10 mg total) by mouth daily with breakfast. 09/22/14  Yes Jason Brooks, MD  simvastatin (ZOCOR) 10 MG tablet Take 1 tablet (10 mg total) by mouth daily at 6 PM. 11/03/14  Yes Raymond Gurney, PA-C  tamsulosin (FLOMAX) 0.4 MG CAPS capsule Take 0.4 mg by mouth daily.   Yes Historical Provider, MD  XARELTO 15 MG TABS tablet TAKE 1 TABLET BY MOUTH EVERY DAY WITH SUPPER 10/06/14  Yes Jason Bollman, MD  diclofenac sodium (VOLTAREN) 1 % GEL Apply 2 g topically 4 (four) times daily. Patient not taking: Reported on 11/16/2014 06/22/14   Jason Brooks, MD  meclizine (ANTIVERT) 12.5 MG tablet Take 12.5 mg by mouth every 12 (twelve) hours as needed for dizziness.    Historical Provider, MD  mupirocin ointment (BACTROBAN) 2 % 1 Application, nasally, 2 times daily for 5 days prior to surgery Patient not taking: Reported on 11/16/2014 10/30/14   Jason Kerns, MD  promethazine (PHENERGAN) 25 MG tablet TAKE 1 TABLET BY MOUTH EVERY 6 HOURS  AS NEEDED FOR NAUSEA Patient not taking: Reported on 11/16/2014 07/27/14   Jason Brooks, MD    Physical Exam: Filed Vitals:   11/17/14 0100 11/17/14 0128 11/17/14 0153 11/17/14 0159  BP: 176/84 140/60  191/86  Pulse: 74 65  68  Temp:    97.5 F (36.4 C)  TempSrc:    Oral  Resp: 20 24  18   Height:   5\' 11"  (1.803 m)   Weight:   87.998 kg (194 lb)   SpO2: 93% 97%  98%     General:  Moderately built and nourished.  Eyes: Anicteric no pallor.  ENT: No discharge from the ears eyes nose and mouth.  Neck: Elevated JVD. No mass felt. Skin scar seen from the recent left carotid endarterectomy.  Cardiovascular: S1 and S2 heard.  Respiratory: No rhonchi or crepitations.  Abdomen: Soft nontender bowel sounds present.  Skin: Chronic skin changes.  Musculoskeletal: Bilateral lower extremity edema 3+ edema.  Psychiatric: Appears normal.  Neurologic: Alert awake oriented to time place and person. Moves all extremities.  Labs on Admission:  Basic Metabolic Panel:  Recent Labs Lab 11/16/14 1942  NA 138  K 4.5  CL 106  CO2 24  GLUCOSE 107*  BUN 31*  CREATININE 1.30*  CALCIUM 8.7*   Liver Function Tests: No results for input(s): AST, ALT, ALKPHOS, BILITOT, PROT, ALBUMIN in the last 168 hours. No results for input(s): LIPASE, AMYLASE in the last 168 hours. No results for input(s): AMMONIA in the last 168 hours. CBC:  Recent Labs Lab 11/16/14 1942  WBC 7.9  NEUTROABS 5.8  HGB 13.1  HCT 39.5  MCV 96.8  PLT 247   Cardiac Enzymes: No results for input(s): CKTOTAL, CKMB, CKMBINDEX, TROPONINI in the last 168 hours.  BNP (last 3 results)  Recent Labs  11/16/14 1942  BNP 598.4*    ProBNP (last 3 results) No results for input(s): PROBNP in the last 8760  hours.  CBG: No results for input(s): GLUCAP in the last 168 hours.  Radiological Exams on Admission: Dg Chest 2 View  11/16/2014   CLINICAL DATA:  Bilateral edema and leg swelling the past 3 days  EXAM:  CHEST  2 VIEW  COMPARISON:  Radiograph 05/22/2014  FINDINGS: Stable enlarged cardiac silhouette. There is central venous pulmonary congestion similar comparison exam. No focal infiltrate. No overt pulmonary edema. No pneumothorax.  IMPRESSION: Cardiomegaly and central venous congestion.   Electronically Signed   By: Genevive Bi M.D.   On: 11/16/2014 20:55    EKG: Independently reviewed. Sinus rhythm with RBBB.  Assessment/Plan Principal Problem:   Diastolic CHF, acute Active Problems:   HYPERTENSION, BENIGN   CKD (chronic kidney disease), stage III   PAF (paroxysmal atrial fibrillation)   History of DVT (deep vein thrombosis)   Bilateral edema of lower extremity   CHF (congestive heart failure)   1. Acute diastolic CHF last EF measured in March 2016 was 50-55% - patient has good response to Lasix and I have placed patient on 40 mg IV every 12. Closely follow intake output and metabolic panel. Check daily weights. Since patient has had changes in his CHF and lower extremity edema I have ordered another 2-D echo to check EF. Cycle cardiac markers. Not on ACE inhibitor due to renal failure. 2. Paroxysmal atrial fibrillation presently in sinus rhythm - chads 2 vasc score is more than 2. Patient is on xarelto. Presently rate controlled. 3. History of recurrent DVT and PE status post IVC filter placement - on xarelto. Dopplers were ordered which are pending. 4. Hypertension - continue present medications. 5. Chronic kidney disease stage III creatinine appears to be at baseline closely follow intake output. 6. Recent left carotid endarterectomy.  I have reviewed patient's old charts labs and personally reviewed x-rays and EKG.   DVT Prophylaxis xarelto.  Code Status: Full code. Family Communication: Discussed patient's son.  Disposition Plan: Admit to inpatient.    Jason [name]. [facility] Pager [phone].  If 7PM-7AM, please contact  night-coverage [email or web address] Password [email or web address] 11/17/2014, 2:04 AM

## 2014-11-17 NOTE — Progress Notes (Addendum)
[facility] PROGRESS NOTE  Jason [name] ZOX:[id number] DOB: Jun 02, 1919 DOA: 11/16/2014 PCP: Leo Grosser, MD   Off Service Summary 81yo presents with worsened B LE edema in acute diastolic chf exacerbation.  Assessment/Plan: 1. Acutely decompensated diastolic CHF exacerbation 1. Pt currently 12 lbs over dry weight 2. Pt is continued on IV lasix 40mg  BID 3. Dry wt around 82-83kg 4. Wt today 87.9kg 5. Cont i/o and daily weights 2. Paroxysmal Afib 1. Rate controlled 2. Cont monitor 3. Hx recurrent DVT and PE s/p IVC filter placement 1. Stable 2. Pt is continued on xarelto 4. HTN 1. BP stable 2. Cont monitor 5. CKD3 1. Cr remaining stable 2. Cont monitor closely as pt is being diuresed  Code Status: Full Family Communication: Pt in room Disposition Plan: Pending   Consultants:    Procedures:    Antibiotics:    HPI/Subjective: States feeling better. Eager to go home  Objective: Filed Vitals:   11/17/14 0159 11/17/14 0317 11/17/14 0429 11/17/14 0641  BP: 191/86 174/74 180/74 143/69  Pulse: 68  61 64  Temp: 97.5 F (36.4 C)  98 F (36.7 C)   TempSrc: Oral  Axillary   Resp: 18  18   Height:      Weight:      SpO2: 98%  98%     Intake/Output Summary (Last 24 hours) at 11/17/14 1624 Last data filed at 11/17/14 1507  Gross per 24 hour  Intake   1020 ml  Output   3400 ml  Net  -2380 ml   Filed Weights   11/16/14 1843 11/17/14 0153  Weight: 88.451 kg (195 lb) 87.998 kg (194 lb)    Exam:   General:  Awake, in nad  Cardiovascular: regular, s1, s2  Respiratory: normal resp effort, no wheezing  Abdomen: soft,nondistended  Musculoskeletal: perfused, 2-3+ pitting LE edema B   Data Reviewed: Basic Metabolic Panel:  Recent Labs Lab 11/16/14 1942 11/17/14 0750  NA 138 139  K 4.5 3.9  CL 106 105  CO2 24 26  GLUCOSE 107* 89  BUN 31* 24*  CREATININE 1.30* 1.37*  CALCIUM 8.7* 8.7*   Liver Function Tests:  Recent Labs Lab  11/17/14 0750  AST 18  ALT 15*  ALKPHOS 52  BILITOT 0.6  PROT 6.5  ALBUMIN 3.2*   No results for input(s): LIPASE, AMYLASE in the last 168 hours. No results for input(s): AMMONIA in the last 168 hours. CBC:  Recent Labs Lab 11/16/14 1942 11/17/14 0750  WBC 7.9 7.3  NEUTROABS 5.8 4.1  HGB 13.1 12.5*  HCT 39.5 38.0*  MCV 96.8 96.7  PLT 247 246   Cardiac Enzymes:  Recent Labs Lab 11/17/14 0250 11/17/14 0750 11/17/14 1523  TROPONINI 0.05* 0.04* 0.03   BNP (last 3 results)  Recent Labs  11/16/14 1942  BNP 598.4*    ProBNP (last 3 results) No results for input(s): PROBNP in the last 8760 hours.  CBG: No results for input(s): GLUCAP in the last 168 hours.  No results found for this or any previous visit (from the past 240 hour(s)).   Studies: Dg Chest 2 View  11/16/2014   CLINICAL DATA:  Bilateral edema and leg swelling the past 3 days  EXAM: CHEST  2 VIEW  COMPARISON:  Radiograph 05/22/2014  FINDINGS: Stable enlarged cardiac silhouette. There is central venous pulmonary congestion similar comparison exam. No focal infiltrate. No overt pulmonary edema. No pneumothorax.  IMPRESSION: Cardiomegaly and central venous congestion.   Electronically Signed  By: Genevive Bi M.D.   On: 11/16/2014 20:55    Scheduled Meds: . amLODipine  10 mg Oral Daily  . docusate sodium  100 mg Oral QHS  . DULoxetine  30 mg Oral Daily  . furosemide  40 mg Intravenous BID  . latanoprost  1 drop Both Eyes QHS  . mirabegron ER  25 mg Oral Daily  . pantoprazole  40 mg Oral Daily  . polyvinyl alcohol  2 drop Both Eyes TID  . predniSONE  10 mg Oral Q breakfast  . Rivaroxaban  15 mg Oral Q supper  . simvastatin  10 mg Oral q1800  . sodium chloride  3 mL Intravenous Q12H  . tamsulosin  0.4 mg Oral Daily   Continuous Infusions:   Principal Problem:   Diastolic CHF, acute Active Problems:   HYPERTENSION, BENIGN   CKD (chronic kidney disease), stage III   PAF (paroxysmal atrial  fibrillation)   History of DVT (deep vein thrombosis)   Bilateral edema of lower extremity   CHF (congestive heart failure)    CHIU, STEPHEN K  [facility] Pager [phone]. If 7PM-7AM, please contact night-coverage at [email or web address], password [facility] 11/17/2014, 4:24 PM  LOS: 0 days

## 2014-11-17 NOTE — Progress Notes (Signed)
Osvaldo L Pascual [id number] Admission Data: 11/17/2014 3:07 AM Attending Provider: Eduard Clos, MD JYN:[id number],HYQMVH TOM, MD Code Status: Full  BRYLER DIBBLE is a 81 y.o. male patient admitted from ED:  -No acute distress noted.  -No complaints of shortness of breath.  -No complaints of chest pain.   Cardiac Monitoring: Box # 01 in place. Cardiac monitor yields:normal sinus rhythm.  Blood pressure 191/86, pulse 68, temperature 97.5 F (36.4 C), temperature source Oral, resp. rate 18, height  (1.803 m), weight 87.998 kg (194 lb), SpO2 98 %.   IV Fluids:  IV in place, occlusive dsg intact without redness, IV cath forearm right, condition patent and no redness none.   Allergies:  Ativan; Augmentin; Fludrocortisone acetate; and Scopace  Past Medical History:   has a past medical history of PVD (peripheral vascular disease); CAD (coronary artery disease); Hyperlipidemia; Cerebrovascular accident; DVT femoral (deep venous thrombosis) with thrombophlebitis (03/2007); GERD (gastroesophageal reflux disease); Edema; Anxiety; Vertigo; IBS (irritable bowel syndrome); Duodenitis; Gastritis; Esophageal stricture; Cellulitis; Pulmonary embolism; Prostatic hypertrophy; Frequent falls; Subdural hematoma; PTSD (post-traumatic stress disorder); Vertigo; TIA (transient ischemic attack); Hiatal hernia; echocardiogram; Gout; Chronic low back pain; Gait disorder; Peripheral neuropathy; Hydrocele; History of shingles; HOH (hard of hearing); Myocardial infarction ("years ago"); Daily headache; Depression; Urinary frequency; Blind left eye; Hypertension; Dysrhythmia; and Arthritis.  Past Surgical History:   has past surgical history that includes Hand surgery (Right); Angioplasty; Carotid endarterectomy (Left); Coronary angioplasty with stent; Cataract extraction, bilateral; Tonsillectomy; Tympanic membrane repair (Right, 1978); Hand contracture release (Right); Esophagogastroduodenoscopy (egd) with esophageal  dilation; Insertion of vena cava filter (2013); and Endarterectomy (Left, 11/02/2014).  Social History:   reports that he has never smoked. He has never used smokeless tobacco. He reports that he drinks alcohol. He reports that he does not use illicit drugs.  Skin: charted on CHL  Patient/Family orientated to room. Information packet given to patient/family. Admission inpatient armband information verified with patient/family to include name and date of birth and placed on patient arm. Side rails up x 2, fall assessment and education completed with patient/family. Patient/family able to verbalize understanding of risk associated with falls and verbalized understanding to call for assistance before getting out of bed. Call light within reaZAYDIN BILLEYnt/family able to voice and demonstrate understanding of unit orientation instructions.

## 2014-11-17 NOTE — Progress Notes (Signed)
  Echocardiogram 2D Echocardiogram has been performed.  Leta Jungling M 11/17/2014, 2:58 PM

## 2014-11-17 NOTE — Progress Notes (Signed)
Received pt report from Jazmine,RN-ED.

## 2014-11-17 NOTE — Progress Notes (Signed)
Utilization review completed. Conda Wannamaker, RN, BSN. 

## 2014-11-18 DIAGNOSIS — I5031 Acute diastolic (congestive) heart failure: Secondary | ICD-10-CM

## 2014-11-18 DIAGNOSIS — Z86718 Personal history of other venous thrombosis and embolism: Secondary | ICD-10-CM

## 2014-11-18 DIAGNOSIS — I480 Paroxysmal atrial fibrillation: Secondary | ICD-10-CM

## 2014-11-18 DIAGNOSIS — N183 Chronic kidney disease, stage 3 (moderate): Secondary | ICD-10-CM

## 2014-11-18 DIAGNOSIS — I10 Essential (primary) hypertension: Secondary | ICD-10-CM

## 2014-11-18 LAB — BASIC METABOLIC PANEL
Anion gap: 10 (ref 5–15)
BUN: 35 mg/dL — AB (ref 6–20)
CO2: 27 mmol/L (ref 22–32)
CREATININE: 1.62 mg/dL — AB (ref 0.61–1.24)
Calcium: 8.8 mg/dL — ABNORMAL LOW (ref 8.9–10.3)
Chloride: 101 mmol/L (ref 101–111)
GFR, EST AFRICAN AMERICAN: 41 mL/min — AB (ref >60)
GFR, EST NON AFRICAN AMERICAN: 35 mL/min — AB (ref >60)
Glucose, Bld: 99 mg/dL (ref 65–99)
POTASSIUM: 3.6 mmol/L (ref 3.5–5.1)
SODIUM: 138 mmol/L (ref 135–145)

## 2014-11-18 MED ORDER — FUROSEMIDE 10 MG/ML IJ SOLN: 40.0000 mg | Freq: Every day | INTRAMUSCULAR | Status: DC

## 2014-11-18 NOTE — Evaluation (Signed)
Occupational Therapy Evaluation and Discharge Patient Details Name: Jason [name] MRN: [id number] DOB: [birth year] Today's Date: 11/18/2014    History of Present Illness Patient is a 81 y/o male admitted with acute CHF.  He had recent hospitalization November 02, 2014 for Lt.. CEA due to carotid artery stenosis.  His PMH includes hyperlipidemia, reflux, peripheral neuropathy, decreased hearing, chronic dizziness, CAD, CVA, blindness L eye, a fib, DVT and pulmonary embolus with IVC filter.   Clinical Impression   This 81 yo male admitted with above presents to acute OT at a S level in this environment due to his blindness in his left eye and this is an unfamiliar environment for him--feel from a BADL standpoint he will be Mod I in his own environment. No further acute OT needs, we will sign off.    Follow Up Recommendations  No OT follow up    Equipment Recommendations  None recommended by OT       Precautions / Restrictions Precautions Precautions: Fall Precaution Comments: reports fell (blacked out) about 7 months ago and then started using rollator (scar on forehead he implies due to fall) and hasn't fallen since Restrictions Weight Bearing Restrictions: No      Mobility Bed Mobility   General bed mobility comments: Pt up in recliner upon my arrival  Transfers Overall transfer level: Needs assistance Equipment used: Rolling walker (2 wheeled) Transfers: Sit to/from Stand Sit to Stand: Supervision         General transfer comment: S in this environment due to left eye blind    Balance Overall balance assessment: History of Falls         Standing balance support: Bilateral upper extremity supported Standing balance-Leahy Scale: Fair Standing balance comment: relies on UE support for balance                            ADL Overall ADL's : Needs assistance/impaired                                       General ADL Comments: S in this  environment due to left eye blind-- feel he would Mod I in his familiar environment     Vision Additional Comments: Blind in left eye          Pertinent Vitals/Pain Pain Assessment: No/denies pain     Hand Dominance Right   Extremity/Trunk Assessment Upper Extremity Assessment Upper Extremity Assessment: Overall WFL for tasks assessed     Communication Communication Communication: HOH (has a hearing aid)   Cognition Arousal/Alertness: Awake/alert Behavior During Therapy: WFL for tasks assessed/performed Overall Cognitive Status: Within Functional Limits for tasks assessed       Memory: Decreased short-term memory (said he would like a bath (checked with NT and she had already helped him bath this AM))                        Home Living Family/patient expects to be discharged to:: Assisted living ([facility])                             Home Equipment: Environmental consultant - 4 wheels;Shower seat;Bedside commode;Hand held shower head;Grab bars - toilet;Grab bars - tub/shower   Additional Comments: assist for meals and medications, blind in left eye, but  reports still has car and active license (but daughter has his car)      Prior Functioning/Environment Level of Independence: Independent with assistive device(s)        Comments: goes to [facility] center M, W, F for water aerobics    OT Diagnosis: Generalized weakness         OT Goals(Current goals can be found in the care plan section) Acute Rehab OT Goals Patient Stated Goal: To go back to ALF, return to water exercise  OT Frequency:                End of Session Equipment Utilized During Treatment:  (RW)  Activity Tolerance: Patient tolerated treatment well Patient left: in chair;with call bell/phone within reach;with chair alarm set   Time: 1610-9604 OT Time Calculation (min): 23 min Charges:  OT General Charges $OT Visit: 1 Procedure OT Evaluation $Initial OT Evaluation Tier I: 1  Procedure OT Treatments $Self Care/Home Management : 8-22 mins  Evette Georges [phone] 11/18/2014, 11:05 AM

## 2014-11-18 NOTE — Progress Notes (Signed)
Heart Failure Navigator Consult Note  Presentation: Jason [name] is a 81 y.o. 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 Patient has been noticing increasing lower extremity edema over the last 1 week and also exertional shortness of breath. Denies any chest pain or productive cough or fever chills. Patient also has been having increasing abdominal girth. In the ER chest x-ray shows congestion and cardiomegaly. On exam patient has at least 3+ pitting edema of the lower extremities. Patient has been admitted for decompensated CHF. Patient was given Lasix 20 mg IV in the ER following which patient has at least 500 mL output.   Past Medical History  Diagnosis Date  . PVD (peripheral vascular disease)   . CAD (coronary artery disease)     a. 12/2005 - PCI to OM  . Hyperlipidemia   . Cerebrovascular accident   . DVT femoral (deep venous thrombosis) with thrombophlebitis 03/2007    [name] 07/13/2010  . GERD (gastroesophageal reflux disease)   . Edema   . Anxiety   . Vertigo   . IBS (irritable bowel syndrome)   . Duodenitis   . Gastritis   . Esophageal stricture   . Cellulitis   . Pulmonary embolism   . Prostatic hypertrophy   . Frequent falls   . Subdural hematoma   . PTSD (post-traumatic stress disorder)   . Vertigo   . TIA (transient ischemic attack)     a. 01/2006 and 05/2006.   [name] Hiatal hernia     periferal neuropathy  . Hx of echocardiogram     Echo 9/13:  Mild LVH, EF 60-65%, Gr 1 diast dysfn, mild AI, mild BAE  . Gout   . Chronic low back pain   . Gait disorder   . Peripheral neuropathy   . Hydrocele   . History of shingles     Left lumbar  . HOH (hard of hearing)     hearing aid, totally in R ear ( no hearing aid in  L )   . Myocardial infarction "years ago"    "dr said I'd had a small one"  . Daily headache   . Depression   . Urinary frequency   . Blind left eye   . Hypertension   . Dysrhythmia   . Arthritis     Social History   Social History  . Marital Status: Married    Spouse Name: N/A  . Number of Children: 2  . Years of Education: HS   Occupational History  . retired    Social History Main Topics  . Smoking status: Never Smoker   . Smokeless tobacco: Never Used  . Alcohol Use: 0.0 oz/week    0 Standard drinks or equivalent per week     Comment: wine occasionally  . Drug Use: No  . Sexual Activity: No   Other Topics Concern  . 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   Patient drinks about 2 cups of caffeine daily.    ECHO:Study Conclusions--11/17/14  - Left ventricle: The cavity size was normal. There was mild focal basal hypertrophy of the septum. Systolic function was normal. The estimated ejection fraction was  in the range of 50% to 55%. Wall motion was normal; there were no regional wall motion abnormalities. Doppler parameters are consistent with abnormal left ventricular relaxation (grade 1 diastolic dysfunction). - Aortic valve: Trileaflet; moderately thickened, moderately calcified leaflets. There was trivial regurgitation. - Left atrium: The atrium was moderately dilated. - Pulmonic valve: There was moderate regurgitation. - Pulmonary arteries: Systolic pressure was mildly increased. PA peak pressure: 38 mm Hg (S). - Pericardium, extracardiac: A trivial pericardial effusion was identified posterior to the heart.  Impressions:  - No cardiac source of emboli was indentified. Compared to the prior study, there has been no significant interval change.  Transthoracic echocardiography. M-mode, complete 2D, spectral Doppler, and color Doppler. Birthdate:  Patient birthdate: 14-Oct-1919. Age: Patient is 81 yr old. Sex: Gender: male. BMI: 27.8 kg/m^2. Blood pressure:   143/69 Patient status: Inpatient. Study date: Study date: 11/17/2014. Study time: 02:15 PM. Location: Bedside.  BNP    Component Value Date/Time   BNP 598.4* 11/16/2014 1942    ProBNP    Component Value Date/Time   PROBNP 639.7* 12/16/2012 1428     Education Assessment and Provision:  Detailed education and instructions provided on heart failure disease management including the following:  Signs and symptoms of Heart Failure When to call the physician Importance of daily weights Low sodium diet Fluid restriction Medication management Anticipated future follow-up appointments  Patient education given on each of the above topics.  Patient acknowledges understanding and acceptance of all instructions.  I spoke at length with Mr Gwynne and his daughter regarding HF.  She says that they have never been told before that he had any issues with his heart other than "AFib" and "occasional high blood pressure".  We discussed HF in general and recommendations for home.  He lives in an ALF and plans to return there.  She says that all instructions must be written in discharge orders and as far as she knows the facility will follow.  They currently only weigh him weekly.  I explained the importance of daily weights and how they relate to the signs and symptoms of HF.  He enjoys eating pickles however says that he currently eats a low sodium diet at the facility "as far as he knows".  We discussed at length a low sodium diet and high sodium foods to avoid.  He also enjoys water aerobics and I told them there was no reason for him to stop that activity unless he becomes very SOB.  I encouraged his daughter to call me after discharge with any concerns or questions related to his HF.  He follows with Dr. Excell Seltzer as an outpatient and will need follow-up appt with him.  Education  Materials:  "Living Better With Heart Failure" Booklet, Daily Weight Tracker Tool and Heart Failure Educational Video.   High Risk Criteria for Readmission and/or Poor Patient Outcomes:   EF <30%- No 50-55% with grade 1 dias dys  2 or more admissions in 6 months- yes 3/6  Difficult social situation- No  Demonstrates medication noncompliance- No-    Barriers of Care:  Knowledge and ability of ALF to assist him with compliance  Discharge Planning:   Plans to return to ALF

## 2014-11-18 NOTE — Progress Notes (Signed)
[facility] PROGRESS NOTE  Jason [name] ZOX:[id number] DOB: 03/16/1919 DOA: 11/16/2014  PCP: Leo Grosser, MD  Brief HPI: 81 year old Caucasian male with a past medical history of coronary artery disease, paroxysmal atrial fibrillation on anticoagulation, history of recurrent DVTs and PE status post IVC filter, who recently underwent left carotid endarterectomy. Presented with complaints of increasing lower extremity swelling and shortness of breath. Patient was admitted for further management of diastolic congestive heart failure.  Past medical history:  Past Medical History  Diagnosis Date  . PVD (peripheral vascular disease)   . CAD (coronary artery disease)     a. 12/2005 - PCI to OM  . Hyperlipidemia   . Cerebrovascular accident   . DVT femoral (deep venous thrombosis) with thrombophlebitis 03/2007    [name] 07/13/2010  . GERD (gastroesophageal reflux disease)   . Edema   . Anxiety   . Vertigo   . IBS (irritable bowel syndrome)   . Duodenitis   . Gastritis   . Esophageal stricture   . Cellulitis   . Pulmonary embolism   . Prostatic hypertrophy   . Frequent falls   . Subdural hematoma   . PTSD (post-traumatic stress disorder)   . Vertigo   . TIA (transient ischemic attack)     a. 01/2006 and 05/2006.   [name] Hiatal hernia     periferal neuropathy  . Hx of echocardiogram     Echo 9/13:  Mild LVH, EF 60-65%, Gr 1 diast dysfn, mild AI, mild BAE  . Gout   . Chronic low back pain   . Gait disorder   . Peripheral neuropathy   . Hydrocele   . History of shingles     Left lumbar  . HOH (hard of hearing)     hearing aid, totally in R ear ( no hearing aid in L )   . Myocardial infarction "years ago"    "dr said I'd had a small one"  . Daily headache   . Depression   . Urinary frequency   . Blind left eye   . Hypertension   . Dysrhythmia   . Arthritis     Consultants: None  Procedures:  2-D echocardiogram Study Conclusions - Left ventricle: The cavity  size was normal. There was mild focalbasal hypertrophy of the septum. Systolic function was normal.The estimated ejection fraction was in the range of 50% to 55%.Wall motion was normal; there were no regional wall motionabnormalities. Doppler parameters are consistent with abnormalleft ventricular relaxation (grade 1 diastolic dysfunction). - Aortic valve: Trileaflet; moderately thickened, moderatelycalcified leaflets. There was trivial regurgitation. - Left atrium: The atrium was moderately dilated. - Pulmonic valve: There was moderate regurgitation. - Pulmonary arteries: Systolic pressure was mildly increased. PApeak pressure: 38 mm Hg (S). - Pericardium, extracardiac: A trivial pericardial effusion wasidentified posterior to the heart. Impressions: - No cardiac source of emboli was indentified. Compared to theprior study, there has been no significant interval change.  Lower extremity venous Doppler Summary: No evidence of deep vein thrombosis involving the visualized veins of the right lower extremity and left lower extremity.  Antibiotics: None  Subjective: Patient feels much better. His breathing is much improved. He states that his legs are not as swollen as before. Denies any chest pain.  Objective: Vital Signs  Filed Vitals:   11/17/14 0641 11/17/14 1837 11/17/14 2020 11/18/14 0511  BP: 143/69 139/60 167/77 137/72  Pulse: 64 65 77 62  Temp:  98.2 F (36.8 C) 97.5 F (36.4 C) 98.2  F (36.8 C)  TempSrc:  Oral Oral Oral  Resp:  18 20 18   Height:      Weight:    84.414 kg (186 lb 1.6 oz)  SpO2:  98% 96% 94%    Intake/Output Summary (Last 24 hours) at 11/18/14 1003 Last data filed at 11/18/14 0511  Gross per 24 hour  Intake    600 ml  Output   1375 ml  Net   -775 ml   Filed Weights   11/16/14 1843 11/17/14 0153 11/18/14 0511  Weight: 88.451 kg (195 lb) 87.998 kg (194 lb) 84.414 kg (186 lb 1.6 oz)    General appearance: alert, cooperative, appears stated  age and no distress Resp: Slightly diminished entry at the bases. Few crackles but mostly clear to auscultation. Cardio: regular rate and rhythm, S1, S2 normal, no murmur, click, rub or gallop GI: soft, non-tender; bowel sounds normal; no masses,  no organomegaly Extremities: Minimal pedal edema bilateral lower extremity Neurologic: No focal deficits  Lab Results:  Basic Metabolic Panel:  Recent Labs Lab 11/16/14 1942 11/17/14 0750 11/18/14 0354  NA 138 139 138  K 4.5 3.9 3.6  CL 106 105 101  CO2 24 26 27   GLUCOSE 107* 89 99  BUN 31* 24* 35*  CREATININE 1.30* 1.37* 1.62*  CALCIUM 8.7* 8.7* 8.8*   Liver Function Tests:  Recent Labs Lab 11/17/14 0750  AST 18  ALT 15*  ALKPHOS 52  BILITOT 0.6  PROT 6.5  ALBUMIN 3.2*   CBC:  Recent Labs Lab 11/16/14 1942 11/17/14 0750  WBC 7.9 7.3  NEUTROABS 5.8 4.1  HGB 13.1 12.5*  HCT 39.5 38.0*  MCV 96.8 96.7  PLT 247 246   Cardiac Enzymes:  Recent Labs Lab 11/17/14 0250 11/17/14 0750 11/17/14 1523  TROPONINI 0.05* 0.04* 0.03   BNP (last 3 results)  Recent Labs  11/16/14 1942  BNP 598.4*     Studies/Results: Dg Chest 2 View  11/16/2014   CLINICAL DATA:  Bilateral edema and leg swelling the past 3 days  EXAM: CHEST  2 VIEW  COMPARISON:  Radiograph 05/22/2014  FINDINGS: Stable enlarged cardiac silhouette. There is central venous pulmonary congestion similar comparison exam. No focal infiltrate. No overt pulmonary edema. No pneumothorax.  IMPRESSION: Cardiomegaly and central venous congestion.   Electronically Signed   By: Genevive Bi M.D.   On: 11/16/2014 20:55    Medications:  Scheduled: . amLODipine  10 mg Oral Daily  . docusate sodium  100 mg Oral QHS  . DULoxetine  30 mg Oral Daily  . [START ON 11/19/2014] furosemide  40 mg Intravenous Daily  . latanoprost  1 drop Both Eyes QHS  . mirabegron ER  25 mg Oral Daily  . pantoprazole  40 mg Oral Daily  . polyvinyl alcohol  2 drop Both Eyes TID  .  predniSONE  10 mg Oral Q breakfast  . Rivaroxaban  15 mg Oral Q supper  . simvastatin  10 mg Oral q1800  . sodium chloride  3 mL Intravenous Q12H  . tamsulosin  0.4 mg Oral Daily   Continuous:  WUJ:[id number] **OR** acetaminophen, ALPRAZolam, HYDROcodone-acetaminophen, meclizine, nitroGLYCERIN, ondansetron **OR** ondansetron (ZOFRAN) IV  Assessment/Plan:  Principal Problem:   Diastolic CHF, acute Active Problems:   HYPERTENSION, BENIGN   CKD (chronic kidney disease), stage III   PAF (paroxysmal atrial fibrillation)   History of DVT (deep vein thrombosis)   Bilateral edema of lower extremity   CHF (congestive heart failure)  Acute diastolic congestive heart failure Patient has improved with IV diuresis. His weight has decreased. He is diuresed quite well. Echocardiogram report as above. Mildly elevated troponins, likely due to demand ischemia. No wall motion abnormalities noted on echocardiogram. Patient denies any chest pain. No further ischemic workup necessary at this time. We'll cut back on dose of Lasix.  History of paroxysmal atrial fibrillation Rate is well controlled. 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 No DVT noted on lower extremity Dopplers. Continue anticoagulation  Chronic kidney disease stage III Creatinine noted to have climbed some today. Most likely due to IV diuresis. Cut back on dose of Lasix. Repeat blood work tomorrow morning.  History of essential hypertension Blood pressure is stable.  On chronic steroids Old records reviewed. He was placed on prednisone due to history of neck pain and cervical radiculopathy. Defer to his primary care physician.  Recent left carotid endarterectomy Incision site appears to be healing well.  DVT Prophylaxis: On oral anticoagulation    Code Status: Full code  Family Communication: Discussed with the patient  Disposition Plan: Will likely return to  ALF tomorrow     LOS: 1 day   [facility]  [facility] Pager [phone] 11/18/2014, 10:03 AM  If 7PM-7AM, please contact night-coverage at [email or web address], password [email or web address]

## 2014-11-18 NOTE — Evaluation (Signed)
Physical Therapy Evaluation Patient Details Name: Jason [name] MRN: [id number] DOB: Sep 01, 1919 Today's Date: 11/18/2014   History of Present Illness  Patient is a 81 y/o male admitted with acute CHF.  He had recent hospitalization November 02, 2014 for Lt.. CEA due to carotid artery stenosis.  His PMH includes hyperlipidemia, reflux, peripheral neuropathy, decreased hearing, chronic dizziness, CAD, CVA, blindness L eye, a fib, DVT and pulmonary embolus with IVC filter.  Clinical Impression  Pt admitted with above diagnosis. Pt currently with functional limitations due to the deficits listed below (see PT Problem List). Pt ambulated 170' with RW, no loss of balance. HR 78 with walking, no dyspnea noted. Pt appears to be near baseline with mobility.  Pt will benefit from skilled PT to increase their independence and safety with mobility to allow discharge to the venue listed below.       Follow Up Recommendations No PT follow up    Equipment Recommendations  None recommended by PT    Recommendations for Other Services       Precautions / Restrictions Precautions Precautions: Fall Precaution Comments: reports fell (blacked out) about 7 months ago and then started using rollator (scar on forehead he implies due to fall) and hasn't fallen since Restrictions Weight Bearing Restrictions: No      Mobility  Bed Mobility Overal bed mobility: Modified Independent Bed Mobility: Supine to Sit     Supine to sit: Modified independent (Device/Increase time)     General bed mobility comments: used bed rail, HOB up 30*  Transfers Overall transfer level: Needs assistance Equipment used: Rolling walker (2 wheeled) Transfers: Sit to/from Stand Sit to Stand: Supervision         General transfer comment: verbal cues for hand placement  Ambulation/Gait Ambulation/Gait assistance: Supervision Ambulation Distance (Feet): 170 Feet Assistive device: Rolling walker (2 wheeled) Gait  Pattern/deviations: Step-through pattern;Decreased step length - right;Decreased step length - left;Trunk flexed   Gait velocity interpretation: at or above normal speed for age/gender General Gait Details: steady with RW, no LOB  Stairs            Wheelchair Mobility    Modified Rankin (Stroke Patients Only)       Balance Overall balance assessment: History of Falls         Standing balance support: Bilateral upper extremity supported Standing balance-Leahy Scale: Fair Standing balance comment: relies on UE support for balance                             Pertinent Vitals/Pain Pain Assessment: No/denies pain    Home Living Family/patient expects to be discharged to:: Assisted living ([facility])               Home Equipment: Dan Humphreys - 4 wheels;Shower seat;Bedside commode Additional Comments: assist for meals and medications, blind in right eye, but reports still has car and active license    Prior Function Level of Independence: Independent with assistive device(s)         Comments: [facility] [place] [facility] water aerobics     Hand Dominance        Extremity/Trunk Assessment   Upper Extremity Assessment: Defer to OT evaluation           Lower Extremity Assessment: Overall WFL for tasks assessed      Cervical / Trunk Assessment: Normal  Communication   Communication: HOH  Cognition Arousal/Alertness: Awake/alert Behavior  During Therapy: WFL for tasks assessed/performed Overall Cognitive Status: Within Functional Limits for tasks assessed                      General Comments      Exercises        Assessment/Plan    PT Assessment Patient needs continued PT services  PT Diagnosis Generalized weakness   PT Problem List Decreased balance;Decreased activity tolerance;Decreased mobility;Decreased knowledge of use of DME  PT Treatment Interventions DME instruction;Balance training;Gait  training;Functional mobility training;Patient/family education;Therapeutic activities;Therapeutic exercise   PT Goals (Current goals can be found in the Care Plan section) Acute Rehab PT Goals Patient Stated Goal: To go back to ALF, return to water exercise PT Goal Formulation: With patient Time For Goal Achievement: 12/02/14 Potential to Achieve Goals: Good    Frequency Min 3X/week   Barriers to discharge        Co-evaluation               End of Session Equipment Utilized During Treatment: Gait belt Activity Tolerance: Patient tolerated treatment well Patient left: in chair;with call bell/phone within reach;with chair alarm set Nurse Communication: Mobility status (O2 sats 88-89% at rest, up to 93% with ambulation RN states okay to leave off O2)         Time: 1610-9604 PT Time Calculation (min) (ACUTE ONLY): 24 min   Charges:   PT Evaluation $Initial PT Evaluation Tier I: 1 Procedure PT Treatments $Gait Training: 8-22 mins   PT G Codes:        Tamala Ser 11/18/2014, 9:28 AM [phone]

## 2014-11-18 NOTE — Clinical Social Work Note (Signed)
Clinical Social Work Assessment  Patient Details  Name: QUAMIR WILLEMSEN MRN: [id number] Date of Birth: 22-Apr-1919  Date of referral:  11/18/14               Reason for consult:  Facility Placement (Return to [facility])                Permission sought to share information with:  Facility Sport and exercise psychologist, Family Supports Permission granted to share information::  Yes, Verbal Permission Granted  Name::     [facility], Daughter Nurse, children's::     Relationship::     Contact Information:     Housing/Transportation Living arrangements for the past 2 months:  Linda of Information:  Patient, Adult Children Patient Interpreter Needed:  None Criminal Activity/Legal Involvement Pertinent to Current Situation/Hospitalization:  No - Comment as needed Significant Relationships:  Adult Children Lives with:  Facility Resident Do you feel safe going back to the place where you live?  Yes Need for family participation in patient care:  Yes (Comment)  Care giving concerns: Current resident of [facility]- wants to return there as soon as possible.  Denies any care giving concerns.   Social Worker assessment / plan:  CSW met with patient and spoke to his daughter Shirlean Mylar via phone.  Resident of [facility] where patient states he is very happy with his current living arrangement. He is anxious to return back to the facility.  CSW spoke with Jenny Reichmann at [facility]- states they will accept patient back when medically stable and will just need to review the dc summary and Fl2 at time of d/c.  Fax # [phone].  Fl2 placed on chart for MD's signature.  Daughter will transport patient back to ALF via phone when stable.   Employment status:  Retired Nurse, adult PT Recommendations:   (Return to [place] with [facility]) Information / Referral to community resources:     Patient/Family's Response to care:  Patient and daughter are  agreeable with current plan and are very happy that he will be able to return to the ALF when stable.  Daughter feels that he receives very good care at the facility.     Patient/Family's Understanding of and Emotional Response to Diagnosis, Current Treatment, and Prognosis:  Patient and daughter exhibit a good understanding of his current condition and treatment needs. They are agreeable to home health services if indicated by MD.   Emotional Assessment Appearance:  Appears stated age Attitude/Demeanor/Rapport:   (Calm, Cooperative, relaxed, engaged, happy) Affect (typically observed):  Calm, Happy, Pleasant Orientation:  Oriented to Self, Oriented to Place, Oriented to  Time, Oriented to Situation Alcohol / Substance use:  Alcohol Use Psych involvement (Current and /or in the community):  No (Comment)  Discharge Needs  Concerns to be addressed:  Care Coordination Readmission within the last 30 days:  Yes Current discharge risk:  None Barriers to Discharge:  No Barriers Identified   Williemae Area, LCSW 11/18/2014, 12:25 PM

## 2014-11-19 ENCOUNTER — Encounter: Payer: Medicare Other | Admitting: Vascular Surgery

## 2014-11-19 LAB — BASIC METABOLIC PANEL
ANION GAP: 9 (ref 5–15)
BUN: 36 mg/dL — AB (ref 6–20)
CO2: 25 mmol/L (ref 22–32)
Calcium: 8.6 mg/dL — ABNORMAL LOW (ref 8.9–10.3)
Chloride: 104 mmol/L (ref 101–111)
Creatinine, Ser: 1.58 mg/dL — ABNORMAL HIGH (ref 0.61–1.24)
GFR calc Af Amer: 42 mL/min — ABNORMAL LOW (ref >60)
GFR, EST NON AFRICAN AMERICAN: 36 mL/min — AB (ref >60)
Glucose, Bld: 99 mg/dL (ref 65–99)
POTASSIUM: 3.6 mmol/L (ref 3.5–5.1)
SODIUM: 138 mmol/L (ref 135–145)

## 2014-11-19 MED ORDER — ALPRAZOLAM 0.25 MG PO TABS: 0.2500 mg | ORAL_TABLET | Freq: Two times a day (BID) | ORAL | Status: DC | PRN

## 2014-11-19 MED ORDER — FUROSEMIDE 40 MG PO TABS: 40.0000 mg | ORAL_TABLET | Freq: Every day | ORAL | Status: DC

## 2014-11-19 MED ORDER — FUROSEMIDE 40 MG PO TABS
40.0000 mg | ORAL_TABLET | Freq: Every day | ORAL | Status: DC
  Administered 2014-11-19: 40 mg via ORAL
  Filled 2014-11-19: qty 1

## 2014-11-19 MED ORDER — HYDROCODONE-ACETAMINOPHEN 5-325 MG PO TABS: 1.0000 | ORAL_TABLET | Freq: Four times a day (QID) | ORAL | Status: DC | PRN

## 2014-11-19 NOTE — Progress Notes (Signed)
Physical Therapy Treatment Patient Details Name: Jason [name] MRN: [id number] DOB: 09-Apr-1919 Today's Date: 2014/11/22    History of Present Illness Patient is a 81 y/o male admitted with acute CHF.  He had recent hospitalization November 02, 2014 for Lt.. CEA due to carotid artery stenosis.  His PMH includes hyperlipidemia, reflux, peripheral neuropathy, decreased hearing, chronic dizziness, CAD, CVA, blindness L eye, a fib, DVT and pulmonary embolus with IVC filter.    PT Comments    Pt with excellent gait and increased distance. Pt very pleasant and eager to return to water aerobics on a regular basis. Pt educated for RW use, posture and HEP. Will continue to follow with recommendation for daily ambulation with nursing.   Follow Up Recommendations  No PT follow up     Equipment Recommendations       Recommendations for Other Services       Precautions / Restrictions Precautions Precautions: Fall    Mobility  Bed Mobility               General bed mobility comments: Pt up in recliner upon my arrival  Transfers Overall transfer level: Modified independent                  Ambulation/Gait Ambulation/Gait assistance: Supervision Ambulation Distance (Feet): 400 Feet Assistive device: Rolling walker (2 wheeled) Gait Pattern/deviations: Step-through pattern;Decreased stride length   Gait velocity interpretation: at or above normal speed for age/gender General Gait Details: cues to step into RW and for directional cues   Stairs            Wheelchair Mobility    Modified Rankin (Stroke Patients Only)       Balance Overall balance assessment: History of Falls                                  Cognition Arousal/Alertness: Awake/alert Behavior During Therapy: WFL for tasks assessed/performed Overall Cognitive Status: Within Functional Limits for tasks assessed                      Exercises General Exercises - Lower  Extremity Long Arc Quad: AROM;Seated;Both;15 reps Hip ABduction/ADduction: AROM;Seated;Both;20 reps Hip Flexion/Marching: AROM;Seated;Both;20 reps    General Comments        Pertinent Vitals/Pain Pain Assessment: No/denies pain    Home Living                      Prior Function            PT Goals (current goals can now be found in the care plan section) Progress towards PT goals: Progressing toward goals    Frequency  Min 3X/week    PT Plan Current plan remains appropriate    Co-evaluation             End of Session   Activity Tolerance: Patient tolerated treatment well Patient left: in chair;with call bell/phone within reach;with chair alarm set     Time: 4098-1191 PT Time Calculation (min) (ACUTE ONLY): 19 min  Charges:  $Gait Training: 8-22 mins                    G Codes:      Delorse Lek 22-Nov-2014, 1:04 PM Delaney Meigs, PT [phone]

## 2014-11-19 NOTE — Research (Signed)
ReDS Vest at Discharge Informed Consent   Subject Name: Jason [name]  Subject met inclusion and exclusion criteria.  The informed consent form, study requirements and expectations were reviewed with the subject and questions and concerns were addressed prior to the signing of the consent form.  The subject verbalized understanding of the trail requirements.  The subject agreed to participate in the ReDS Vest at Discharge trial and signed the informed consent.  The informed consent was obtained prior to performance of any protocol-specific procedures for the subject.  A copy of the signed informed consent was given to the subject and a copy was placed in the subject's medical record.  [name] 11/19/2014, 8:31 AM

## 2014-11-19 NOTE — Progress Notes (Signed)
CSW (Clinical Child psychotherapist) faxed FL2 to facility and confirmed pt is okay to dc back. Pt daughter to provide transport at 4pm back to facility. Facility and pt nurse made aware. Pt packet left at bedside. CSW signing off.  Shammond Arave, LCSWA [phone]

## 2014-11-19 NOTE — Plan of Care (Signed)
Problem: Phase I Progression Outcomes Goal: EF % per last Echo/documented,Core Reminder form on chart Outcome: Completed/Met Date Met:  11/19/14 EF = 50-55% per ECHO performed on 11/17/14.

## 2014-11-19 NOTE — Discharge Instructions (Signed)

## 2014-11-19 NOTE — Progress Notes (Signed)
ReDS Vest Discharge Study  Results of ReDS reading  Your patient is in the Blinded arm of the Vest at Discharge study.  Your patient has had a ReDS Vest reading and the reading has been transmitted to the cloud.  Your patient is ok for discharge.    Thank You   The research team    

## 2014-11-19 NOTE — Discharge Summary (Signed)
[facility]  Physician Discharge Summary   Patient ID: Jason [name] MRN: [id number] DOB/AGE: 1920-02-06 81 y.o.  Admit date: 11/16/2014 Discharge date: 11/19/2014  PCP: Lynnea Ferrier TOM, MD  DISCHARGE DIAGNOSES:  Principal Problem:   Diastolic CHF, acute Active Problems:   HYPERTENSION, BENIGN   CKD (chronic kidney disease), stage III   PAF (paroxysmal atrial fibrillation)   History of DVT (deep vein thrombosis)   Bilateral edema of lower extremity   CHF (congestive heart failure)   RECOMMENDATIONS FOR OUTPATIENT FOLLOW UP: 1. BASIC metabolic panel next week and then weekly for 3 weeks. 2. Continue to monitor blood pressures. May need additional agents.   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 Presented with complaints of increasing lower extremity swelling and shortness of breath. Patient was admitted for further management of diastolic congestive heart failure.  Consultations:  None  Procedures: 2-D echocardiogram Study Conclusions - Left ventricle: The cavity size was normal. There was mild focalbasal hypertrophy of the septum. Systolic function was normal.The estimated ejection fraction was in the range of 50% to 55%.Wall motion was normal; there were no regional wall motionabnormalities. Doppler parameters are consistent with abnormalleft ventricular relaxation (grade 1 diastolic dysfunction). - Aortic valve: Trileaflet; moderately thickened, moderatelycalcified leaflets. There was trivial regurgitation. - Left atrium: The atrium  was moderately dilated. - Pulmonic valve: There was moderate regurgitation. - Pulmonary arteries: Systolic pressure was mildly increased. PApeak pressure: 38 mm Hg (S). - Pericardium, extracardiac: A trivial pericardial effusion wasidentified posterior to the heart. Impressions: - No cardiac source of emboli was indentified. Compared to theprior study, there has been no significant interval change.  Lower extremity venous Doppler Summary: No evidence of deep vein thrombosis involving the visualized veins of the right lower extremity and left lower extremity.   HOSPITAL COURSE:   Acute diastolic congestive heart failure Patient was started on intravenous Lasix. His ins and outs and daily weights were measured. He diuresed quite well. His weight has decreased. He feels symptomatically much improved. His leg swelling has improved significantly. He did have mildly elevated troponins. Echocardiogram report as above. No wall motion abnormalities were noted. Systolic function is normal. He does have erections for diastolic dysfunction. Patient denies any chest pains. No further ischemic workup is necessary at this time. He has been transitioned to oral Lasix.   History of paroxysmal atrial fibrillation Rate is well controlled. 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 No DVT noted on lower extremity Dopplers. Continue anticoagulation  Chronic kidney disease stage III Creatinine did climb some due to diuresis. With adjustment of the Lasix dose. His creatinine has stabilized. Renal function will need to be measured on a weekly basis starting next week.   History of essential hypertension Blood pressure has been reasonably well controlled for the most part. Few elevated values have been seen. This will need to be closely monitored as an outpatient. May need additional blood pressure medications if blood pressure remains poorly controlled.     On chronic steroids Old records reviewed. He was placed on prednisone due to  history of neck pain and cervical radiculopathy. Defer to his primary care physician.  Recent left carotid endarterectomy Incision site appears to be healing well.  Overall, stable. Discussed with his daughter today. He has been seen by physical and occupational therapy. He is okay for discharge to his assisted living facility today.  PERTINENT LABS:  The results of significant diagnostics from this hospitalization (including imaging, microbiology, ancillary and laboratory) are listed below for reference.      Labs: Basic Metabolic Panel:  Recent Labs Lab 11/16/14 1942 11/17/14 0750 11/18/14 0354 11/19/14 0450  NA 138 139 138 138  K 4.5 3.9 3.6 3.6  CL 106 105 101 104  CO2 GLUCOSE 107* 89 99 99  BUN 31* 24* 35* 36*  CREATININE 1.30* 1.37* 1.62* 1.58*  CALCIUM 8.7* 8.7* 8.8* 8.6*   Liver Function Tests:  Recent Labs Lab 11/17/14 0750  AST 18  ALT 15*  ALKPHOS 52  BILITOT 0.6  PROT 6.5  ALBUMIN 3.2*   CBC:  Recent Labs Lab 11/16/14 1942 11/17/14 0750  WBC 7.9 7.3  NEUTROABS 5.8 4.1  HGB 13.1 12.5*  HCT 39.5 38.0*  MCV 96.8 96.7  PLT 247 246   Cardiac Enzymes:  Recent Labs Lab 11/17/14 0250 11/17/14 0750 11/17/14 1523  TROPONINI 0.05* 0.04* 0.03   BNP: BNP (last 3 results)  Recent Labs  11/16/14 1942  BNP 598.4*    IMAGING STUDIES Dg Chest 2 View  11/16/2014   CLINICAL DATA:  Bilateral edema and leg swelling the past 3 days  EXAM: CHEST  2 VIEW  COMPARISON:  Radiograph 05/22/2014  FINDINGS: Stable enlarged cardiac silhouette. There is central venous pulmonary congestion similar comparison exam. No focal infiltrate. No overt pulmonary edema. No pneumothorax.  IMPRESSION: Cardiomegaly and central venous congestion.   Electronically Signed   By: Genevive Bi M.D.   On: 11/16/2014 20:55    DISCHARGE EXAMINATION: Filed Vitals:   11/18/14 2120  11/19/14 0111 11/19/14 0438 11/19/14 0917  BP: 180/87 155/82 174/82 152/73  Pulse: 80 71 65 67  Temp: 98.2 F (36.8 C)  98.5 F (36.9 C) 98.6 F (37 C)  TempSrc: Oral  Oral Oral  Resp: Height:      Weight:   84.052 kg (185 lb 4.8 oz)   SpO2: 93%  94% 96%   General appearance: alert, cooperative, appears stated age and no distress Resp: Improved air entry bilaterally. No wheezing, rales or rhonchi. Cardio: regular rate and rhythm, S1, S2 normal, no murmur, click, rub or gallop GI: soft, non-tender; bowel sounds normal; no masses,  no organomegaly Neurologic: Grossly normal  DISPOSITION: ALF  Discharge Instructions    Call MD for:  difficulty breathing, headache or visual disturbances    Complete by:  As directed      Call MD for:  extreme fatigue    Complete by:  As directed      Call MD for:  persistant dizziness or light-headedness    Complete by:  As directed      Call MD for:  persistant nausea and vomiting    Complete by:  As directed      Call MD for:  severe uncontrolled pain    Complete by:  As directed      Call MD for:  temperature >100.4    Complete by:  As directed      Diet - low sodium heart healthy    Complete by:  As directed      Discharge instructions    Complete by:  As directed   Check basic metabolic panel next week and then weekly for 3 weeks. Monitor blood pressures regularly. May need additional antihypertensives agents.  You were cared for by a hospitalist during your hospital stay. If you have any questions about your discharge medications or the care you received while you were in the hospital after you are discharged, you can call the unit and asked to speak with the hospitalist on call if the hospitalist that took care of you is not available. Once you are discharged, your primary care physician will handle any further medical issues. Please note that NO REFILLS for any discharge medications will be authorized once you are discharged, as  it is imperative that you return to your primary care physician (or establish a relationship with a primary care physician if you do not have one) for your aftercare needs so that they can reassess your need for medications and monitor your lab values. If you do not have a primary care physician, you can call [phone] for a physician referral.     Increase activity slowly    Complete by:  As directed            ALLERGIES:  Allergies  Allergen Reactions  . Ativan [Lorazepam] Other (See Comments)    unknown  . Augmentin [Amoxicillin-Pot Clavulanate] Nausea And Vomiting    daughter states he can tolerate Amoxicillin ok Profuse vomiting  . Fludrocortisone Acetate Other (See Comments)    "leg swelling"  . Scopace [Scopolamine] Other (See Comments)    *patch "Made him crazy" per patients daughter       Current Discharge Medication List    START taking these medications   Details  furosemide (LASIX) 40 MG tablet Take 1 tablet (40 mg total) by mouth daily. Qty: 30 tablet, Refills: 1      CONTINUE these medications which have CHANGED   Details  ALPRAZolam (XANAX) 0.25 MG tablet Take 1 tablet (0.25 mg total) by mouth 2 (two) times daily as needed for anxiety. Qty: 10 tablet, Refills: 0    HYDROcodone-acetaminophen (NORCO) 5-325 MG per tablet Take 1 tablet by mouth every 6 (six) hours as needed for moderate pain or severe pain. Qty: 20 tablet, Refills: 0      CONTINUE these medications which have NOT CHANGED   Details  !! acetaminophen (TYLENOL) 500 MG tablet Take 2 tablets (1,000 mg total) by mouth 2 (two) times daily. Qty: 60 tablet, Refills: 11    !! acetaminophen (TYLENOL) 500 MG tablet Take 500 mg by mouth every 4 (four) hours as needed for fever.    alum & mag hydroxide-simeth (MAALOX/MYLANTA) 200-200-20 MG/5ML suspension Take 30 mLs by mouth every 6 (six) hours as needed for indigestion or heartburn.    amLODipine (NORVASC) 10 MG tablet Take 1 tablet (10 mg total) by  mouth daily. Qty: 90 tablet, Refills: 3   Associated Diagnoses: Benign essential HTN; Pre-operative clearance; Essential hypertension, benign    DEXILANT 60 MG capsule TAKE ONE CAPSULE BY MOUTH EVERY DAY Qty: 30 capsule, Refills: 11    docusate sodium (COLACE) 100 MG capsule Take 1 capsule (100 mg total) by mouth at bedtime. Qty: 30 capsule, Refills: 11    DULoxetine (CYMBALTA) 30 MG capsule TAKE ONE CAPSULE BY MOUTH EVERY DAY Qty: 30 capsule, Refills: 6    latanoprost (XALATAN) 0.005 % ophthalmic solution Place 1 drop into both eyes at bedtime.  loperamide (IMODIUM) 2 MG capsule Take 2 mg by mouth as needed for diarrhea or loose stools (Not to exceed 8 doses in 24 hours).    magnesium hydroxide (MILK OF MAGNESIA) 400 MG/5ML suspension Take 30 mLs by mouth at bedtime as needed for mild constipation.    MYRBETRIQ 25 MG TB24 tablet TAKE 1 TABLET BY MOUTH EVERY DAY Qty: 30 tablet, Refills: 11    nitroGLYCERIN (NITROSTAT) 0.4 MG SL tablet Place 0.4 mg under the tongue every 5 (five) minutes as needed. Chest pain    ondansetron (ZOFRAN) 4 MG tablet Take 4 mg by mouth every 8 (eight) hours as needed for nausea.    polyethylene glycol powder (GLYCOLAX/MIRALAX) powder MIX 17 GRAMS WITH 4 TO 6 OUNCES OF WATER AND DRINK ONCE DAILY AS NEEDED FOR CONSTIPATION Qty: 527 g, Refills: 2    polyvinyl alcohol (LIQUIFILM TEARS) 1.4 % ophthalmic solution Place 2 drops into both eyes 3 (three) times daily.    predniSONE (DELTASONE) 10 MG tablet Take 1 tablet (10 mg total) by mouth daily with breakfast. Qty: 30 tablet, Refills: 5   Associated Diagnoses: Neck pain, chronic    simvastatin (ZOCOR) 10 MG tablet Take 1 tablet (10 mg total) by mouth daily at 6 PM. Qty: 30 tablet, Refills: 11    tamsulosin (FLOMAX) 0.4 MG CAPS capsule Take 0.4 mg by mouth daily.    XARELTO 15 MG TABS tablet TAKE 1 TABLET BY MOUTH EVERY DAY WITH SUPPER Qty: 30 tablet, Refills: 6    diclofenac sodium (VOLTAREN) 1 % GEL  Apply 2 g topically 4 (four) times daily. Qty: 100 g, Refills: 5   Associated Diagnoses: Pain in joint, ankle and foot, left    meclizine (ANTIVERT) 12.5 MG tablet Take 12.5 mg by mouth every 12 (twelve) hours as needed for dizziness.    mupirocin ointment (BACTROBAN) 2 % 1 Application, nasally, 2 times daily for 5 days prior to surgery Qty: 22 g, Refills: 0   Associated Diagnoses: MRSA (methicillin resistant staph aureus) culture positive     !! - Potential duplicate medications found. Please discuss with provider.    STOP taking these medications     oxyCODONE-acetaminophen (PERCOCET/ROXICET) 5-325 MG per tablet      promethazine (PHENERGAN) 25 MG tablet        Follow-up Information    Follow up with [facility] TOM, MD. Schedule an appointment as soon as possible for a visit in 1 week.   Specialty:  Family Medicine   Why:  post hospitalization follow up   Contact information:   [address] [place] [address] [phone]       TOTAL DISCHARGE TIME: 35 minutes  [facility]  [facility] Pager [phone]  11/19/2014, 11:04 AM

## 2014-11-19 NOTE — Progress Notes (Signed)
Discharge packet given to daughter who is taking patient to [facility] via personal vehicle. Discharge instructions discussed with patient and daughter. Daughter verbally understands instructions

## 2014-11-20 ENCOUNTER — Telehealth: Payer: Self-pay | Admitting: Family Medicine

## 2014-11-20 NOTE — Telephone Encounter (Signed)
Assisted Living home calling to report pt has returned from hospital.  Has hospital order to weigh him daily and report to his PCP any weight change greater then 3 lbs.  They need order from [place] to do this.  Order faxed to home.

## 2014-11-23 ENCOUNTER — Ambulatory Visit: Payer: Medicare Other | Admitting: Physician Assistant

## 2014-11-24 ENCOUNTER — Telehealth: Payer: Self-pay | Admitting: Family Medicine

## 2014-11-24 ENCOUNTER — Encounter: Payer: Self-pay | Admitting: Vascular Surgery

## 2014-11-24 DIAGNOSIS — R278 Other lack of coordination: Secondary | ICD-10-CM | POA: Diagnosis not present

## 2014-11-24 DIAGNOSIS — M6281 Muscle weakness (generalized): Secondary | ICD-10-CM | POA: Diagnosis not present

## 2014-11-24 NOTE — Telephone Encounter (Signed)
Verify he is on lasix 40 qday and if so, increase to 40 bid for 3 days.  NTBS Thursday is no better.  Sooner if worse.

## 2014-11-24 NOTE — Telephone Encounter (Signed)
Pt's nurse case manager, Arline Asp called to let Dr. Tanya Nones know that his weight has increased from 184 to 189.5 lbs. You may reach her @ [phone]

## 2014-11-24 NOTE — Telephone Encounter (Signed)
LMTRC

## 2014-11-25 DIAGNOSIS — R278 Other lack of coordination: Secondary | ICD-10-CM | POA: Diagnosis not present

## 2014-11-25 DIAGNOSIS — M6281 Muscle weakness (generalized): Secondary | ICD-10-CM | POA: Diagnosis not present

## 2014-11-25 NOTE — Telephone Encounter (Signed)
Received return call from [facility]. States that patient weight has increased to 191. Denies SOB, chest pain.   Verified Lasix  PO QD dosage. Verbal orders given to increase Lasix to  PO BID x3 days per MD orders. Appointment scheduled for Thursday, 11/26/2014 @ 9am.   Order faxed to [facility]. Requires MD signature and re-faxing on Thursday, 11/26/2014.

## 2014-11-26 ENCOUNTER — Other Ambulatory Visit: Payer: Self-pay | Admitting: Family Medicine

## 2014-11-26 ENCOUNTER — Ambulatory Visit (INDEPENDENT_AMBULATORY_CARE_PROVIDER_SITE_OTHER): Payer: Self-pay | Admitting: Vascular Surgery

## 2014-11-26 ENCOUNTER — Ambulatory Visit (INDEPENDENT_AMBULATORY_CARE_PROVIDER_SITE_OTHER): Payer: Medicare Other | Admitting: Family Medicine

## 2014-11-26 ENCOUNTER — Encounter: Payer: Self-pay | Admitting: Family Medicine

## 2014-11-26 ENCOUNTER — Encounter: Payer: Self-pay | Admitting: Vascular Surgery

## 2014-11-26 VITALS — BP 180/90 | HR 64 | Temp 97.8°F | Resp 24 | Ht 70.0 in | Wt 195.0 lb

## 2014-11-26 VITALS — BP 186/71 | HR 66 | Ht 70.0 in | Wt 195.0 lb

## 2014-11-26 DIAGNOSIS — I6523 Occlusion and stenosis of bilateral carotid arteries: Secondary | ICD-10-CM

## 2014-11-26 DIAGNOSIS — I5021 Acute systolic (congestive) heart failure: Secondary | ICD-10-CM

## 2014-11-26 DIAGNOSIS — R278 Other lack of coordination: Secondary | ICD-10-CM | POA: Diagnosis not present

## 2014-11-26 DIAGNOSIS — M6281 Muscle weakness (generalized): Secondary | ICD-10-CM | POA: Diagnosis not present

## 2014-11-26 LAB — COMPLETE METABOLIC PANEL WITH GFR
ALT: 15 U/L (ref 9–46)
AST: 18 U/L (ref 10–35)
Albumin: 3.9 g/dL (ref 3.6–5.1)
Alkaline Phosphatase: 63 U/L (ref 40–115)
BILIRUBIN TOTAL: 0.5 mg/dL (ref 0.2–1.2)
BUN: 32 mg/dL — ABNORMAL HIGH (ref 7–25)
CALCIUM: 8.6 mg/dL (ref 8.6–10.3)
CO2: 21 mmol/L (ref 20–31)
CREATININE: 1.29 mg/dL — AB (ref 0.70–1.11)
Chloride: 106 mmol/L (ref 98–110)
GFR, EST AFRICAN AMERICAN: 55 mL/min — AB (ref >=60)
GFR, Est Non African American: 47 mL/min — ABNORMAL LOW (ref >=60)
Glucose, Bld: 76 mg/dL (ref 70–99)
Potassium: 4 mmol/L (ref 3.5–5.3)
Sodium: 138 mmol/L (ref 135–146)
TOTAL PROTEIN: 6.7 g/dL (ref 6.1–8.1)

## 2014-11-26 LAB — CBC WITH DIFFERENTIAL/PLATELET
BASOS ABS: 0 10*3/uL (ref 0.0–0.1)
Basophils Relative: 0 % (ref 0–1)
Eosinophils Absolute: 0.2 10*3/uL (ref 0.0–0.7)
Eosinophils Relative: 3 % (ref 0–5)
HEMATOCRIT: 39.4 % (ref 39.0–52.0)
HEMOGLOBIN: 13.3 g/dL (ref 13.0–17.0)
LYMPHS ABS: 2.2 10*3/uL (ref 0.7–4.0)
LYMPHS PCT: 31 % (ref 12–46)
MCH: 32.3 pg (ref 26.0–34.0)
MCHC: 33.8 g/dL (ref 30.0–36.0)
MCV: 95.6 fL (ref 78.0–100.0)
MPV: 10.6 fL (ref 8.6–12.4)
Monocytes Absolute: 0.8 10*3/uL (ref 0.1–1.0)
Monocytes Relative: 12 % (ref 3–12)
NEUTROS ABS: 3.8 10*3/uL (ref 1.7–7.7)
NEUTROS PCT: 54 % (ref 43–77)
PLATELETS: 200 10*3/uL (ref 150–400)
RBC: 4.12 MIL/uL — ABNORMAL LOW (ref 4.22–5.81)
RDW: 15.6 % — ABNORMAL HIGH (ref 11.5–15.5)
WBC: 7 10*3/uL (ref 4.0–10.5)

## 2014-11-26 NOTE — Telephone Encounter (Signed)
Refill appropriate and filled per protocol. 

## 2014-11-26 NOTE — Progress Notes (Signed)
Patient is a 81 year old male who returns today for postoperative follow-up after recent left carotid endarterectomy. He reports no symptoms of TIA amaurosis or stroke. He states that his swallowing is intact and no difficulty with his voice. He has had some difficulty with leg swelling and currently is having adjustment of his diuretic. Otherwise he is doing well.  Physical exam:  Filed Vitals:   11/26/14 1206  BP: 186/71  Pulse: 66  Height:  (1.778 m)  Weight: 195 lb (88.451 kg)  SpO2: 96%    Neck: Well-healed left neck incision no carotid bruit Neuro: Symmetric upper extremity and lower extremity motor strength which is 5 over 5  Assessment: Doing well status post carotid endarterectomy  Follow-up 6 months repeat carotid duplex scan he does have a 50-70% stenosis on the right side we'll need to continue to follow it is asymptomatic.  Fabienne Bruns, MD [facility] Office: [phone] Pager: [phone]

## 2014-11-26 NOTE — Progress Notes (Signed)
Subjective:    Patient ID: Jason [name], male    DOB: 11/26/1919, 81 y.o.   MRN: [id number]  HPI Discharged from hospital 9/8 for CHF and fluid retention on lasix 40 mg poqday.  I was informed Tuesday he had gained 5 lbs since discharge.  At that time, I increased lasix to 40 bid and asked to see the patient today.   Wt Readings from Last 3 Encounters:  11/26/14 195 lb (88.451 kg)  11/19/14 185 lb 4.8 oz (84.052 kg)  11/02/14 192 lb (87.091 kg)   He has +2 pitting edema to the level of his knees. He denies any cough or shortness of breath. Past Medical History  Diagnosis Date  . PVD (peripheral vascular disease)   . CAD (coronary artery disease)     a. 12/2005 - PCI to OM  . Hyperlipidemia   . Cerebrovascular accident   . DVT femoral (deep venous thrombosis) with thrombophlebitis 03/2007    Jason [name] 07/13/2010  . GERD (gastroesophageal reflux disease)   . Edema   . Anxiety   . Vertigo   . IBS (irritable bowel syndrome)   . Duodenitis   . Gastritis   . Esophageal stricture   . Cellulitis   . Pulmonary embolism   . Prostatic hypertrophy   . Frequent falls   . Subdural hematoma   . PTSD (post-traumatic stress disorder)   . Vertigo   . TIA (transient ischemic attack)     a. 01/2006 and 05/2006.   Jason [name] Hiatal hernia     periferal neuropathy  . Hx of echocardiogram     Echo 9/13:  Mild LVH, EF 60-65%, Gr 1 diast dysfn, mild AI, mild BAE  . Gout   . Chronic low back pain   . Gait disorder   . Peripheral neuropathy   . Hydrocele   . History of shingles     Left lumbar  . HOH (hard of hearing)     hearing aid, totally in R ear ( no hearing aid in L )   . Myocardial infarction "years ago"    "dr said I'd had a small one"  . Daily headache   . Depression   . Urinary frequency   . Blind left eye   . Hypertension   . Dysrhythmia   . Arthritis    Past Surgical History  Procedure Laterality Date  . Hand surgery Right     "replaced bone in my finger"  . Angioplasty    .  Carotid endarterectomy Left   . Coronary angioplasty with stent placement      left circumflex  . Cataract extraction, bilateral    . Tonsillectomy    . Tympanic membrane repair Right 1978    Jason [name] 07/27/2010; "couldn'getting so t couldn't hear; had noise in my ear; didn't correct either  . Hand contracture release Right     palm/notes 07/27/2010  . Esophagogastroduodenoscopy (egd) with esophageal dilation    . Insertion of vena cava filter  2013    h/o PE  . Endarterectomy Left 11/02/2014    Procedure: LEFT  CAROTID ARTERY ENDARTERECTOMY  WITH DACRON PATCH ANGIOPLASTY;  Surgeon: Sherren Kerns, MD;  Location: [facility] OR;  Service: Vascular;  Laterality: Left;   Current Outpatient Prescriptions on File Prior to Visit  Medication Sig Dispense Refill  . acetaminophen (TYLENOL) 500 MG tablet Take 2 tablets (1,000 mg total) by mouth 2 (two) times daily. 60 tablet 11  . acetaminophen (TYLENOL) 500 MG tablet  Take 500 mg by mouth every 4 (four) hours as needed for fever.    . ALPRAZolam (XANAX) 0.25 MG tablet Take 1 tablet (0.25 mg total) by mouth 2 (two) times daily as needed for anxiety. 10 tablet 0  . alum & mag hydroxide-simeth (MAALOX/MYLANTA) 200-200-20 MG/5ML suspension Take 30 mLs by mouth every 6 (six) hours as needed for indigestion or heartburn.    Jason [name] amLODipine (NORVASC) 10 MG tablet Take 1 tablet (10 mg total) by mouth daily. 90 tablet 3  . DEXILANT 60 MG capsule TAKE ONE CAPSULE BY MOUTH EVERY DAY 30 capsule 11  . diclofenac sodium (VOLTAREN) 1 % GEL Apply 2 g topically 4 (four) times daily. (Patient not taking: Reported on 11/16/2014) 100 g 5  . docusate sodium (COLACE) 100 MG capsule Take 1 capsule (100 mg total) by mouth at bedtime. 30 capsule 11  . DULoxetine (CYMBALTA) 30 MG capsule TAKE ONE CAPSULE BY MOUTH EVERY DAY 30 capsule 6  . furosemide (LASIX) 40 MG tablet Take 1 tablet (40 mg total) by mouth daily. 30 tablet 1  . HYDROcodone-acetaminophen (NORCO) 5-325 MG per tablet Take 1  tablet by mouth every 6 (six) hours as needed for moderate pain or severe pain. 20 tablet 0  . latanoprost (XALATAN) 0.005 % ophthalmic solution Place 1 drop into both eyes at bedtime.    Jason [name] loperamide (IMODIUM) 2 MG capsule Take 2 mg by mouth as needed for diarrhea or loose stools (Not to exceed 8 doses in 24 hours).    . magnesium hydroxide (MILK OF MAGNESIA) 400 MG/5ML suspension Take 30 mLs by mouth at bedtime as needed for mild constipation.    . meclizine (ANTIVERT) 12.5 MG tablet Take 12.5 mg by mouth every 12 (twelve) hours as needed for dizziness.    . mupirocin ointment (BACTROBAN) 2 % 1 Application, nasally, 2 times daily for 5 days prior to surgery (Patient not taking: Reported on 11/16/2014) 22 g 0  . MYRBETRIQ 25 MG TB24 tablet TAKE 1 TABLET BY MOUTH EVERY DAY 30 tablet 11  . nitroGLYCERIN (NITROSTAT) 0.4 MG SL tablet Place 0.4 mg under the tongue every 5 (five) minutes as needed. Chest pain    . ondansetron (ZOFRAN) 4 MG tablet Take 4 mg by mouth every 8 (eight) hours as needed for nausea.    . polyethylene glycol powder (GLYCOLAX/MIRALAX) powder MIX 17 GRAMS WITH 4 TO 6 OUNCES OF WATER AND DRINK ONCE DAILY AS NEEDED FOR CONSTIPATION 527 g 2  . polyvinyl alcohol (LIQUIFILM TEARS) 1.4 % ophthalmic solution Place 2 drops into both eyes 3 (three) times daily.    . predniSONE (DELTASONE) 10 MG tablet Take 1 tablet (10 mg total) by mouth daily with breakfast. 30 tablet 5  . simvastatin (ZOCOR) 10 MG tablet Take 1 tablet (10 mg total) by mouth daily at 6 PM. 30 tablet 11  . tamsulosin (FLOMAX) 0.4 MG CAPS capsule Take 0.4 mg by mouth daily.    Jason [name] 15 MG TABS tablet TAKE 1 TABLET BY MOUTH EVERY DAY WITH SUPPER 30 tablet 6   No current facility-administered medications on file prior to visit.   Allergies  Allergen Reactions  . Ativan [Lorazepam] Other (See Comments)    unknown  . Augmentin [Amoxicillin-Pot Clavulanate] Nausea And Vomiting    daughter states he can tolerate  Amoxicillin ok Profuse vomiting  . Fludrocortisone Acetate Other (See Comments)    "leg swelling"  . Scopace [Scopolamine] Other (See Comments)    *patch "Made him  crazy" per patients daughter    Social History   Social History  . Marital Status: Married    Spouse Name: N/A  . Number of Children: 2  . Years of Education: HS   Occupational History  . retired    Social History Main Topics  . Smoking status: Never Smoker   . Smokeless tobacco: Never Used  . Alcohol Use: 0.0 oz/week    0 Standard drinks or equivalent per week     Comment: wine occasionally  . Drug Use: No  . Sexual Activity: No   Other Topics Concern  . 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   Patient drinks about 2 cups of caffeine daily.     Review of Systems  All other systems reviewed and are negative.      Objective:   Physical Exam  Cardiovascular: Normal rate, regular rhythm and normal heart sounds.   Pulmonary/Chest: Effort normal and breath sounds normal. No respiratory distress. He has no wheezes. He has no rales.  Abdominal: Soft. Bowel sounds are normal. He exhibits no distension. There is no tenderness. There is no rebound and no guarding.  Musculoskeletal: He exhibits edema.  Vitals reviewed.         Assessment & Plan:  Acute systolic congestive heart failure - Plan: CBC with Differential/Platelet, COMPLETE METABOLIC PANEL WITH GFR, Brain natriuretic peptide  Increase Lasix to 80 mg by mouth twice a day. Begin K-Dur 20 milliequivalents by mouth daily. Recheck on Monday or immediately if worse. At that time I'll likely discontinue amlodipine as this may be contributing to fluid retention. May also need to readdress the prednisone as it could be contributing to fluid retention.

## 2014-11-27 ENCOUNTER — Telehealth: Payer: Self-pay | Admitting: Family Medicine

## 2014-11-27 DIAGNOSIS — M6281 Muscle weakness (generalized): Secondary | ICD-10-CM | POA: Diagnosis not present

## 2014-11-27 DIAGNOSIS — R278 Other lack of coordination: Secondary | ICD-10-CM | POA: Diagnosis not present

## 2014-11-27 LAB — BRAIN NATRIURETIC PEPTIDE: Brain Natriuretic Peptide: 262.3 pg/mL — ABNORMAL HIGH (ref 0.0–100.0)

## 2014-11-27 NOTE — Telephone Encounter (Signed)
See Plan in note, I had to write hand written rx because computer went out.

## 2014-11-27 NOTE — Telephone Encounter (Signed)
[facility] now calling to get order for potassium so they can order from their pharmacy service.  Can't find anywhere, any provider ordering this.  Please advise?

## 2014-11-27 NOTE — Telephone Encounter (Signed)
Order has been verified and [facility] order faxed to [facility] so they can order from their pharmacy service.

## 2014-11-27 NOTE — Telephone Encounter (Signed)
Patient calling to say that the potassium was 300 dollars would like to know if there is anything that can be done about this  [phone]

## 2014-11-27 NOTE — Addendum Note (Signed)
Addended by: Adria Dill L on: 11/27/2014 02:33 PM   Modules accepted: Orders

## 2014-11-30 ENCOUNTER — Ambulatory Visit (INDEPENDENT_AMBULATORY_CARE_PROVIDER_SITE_OTHER): Payer: Medicare Other | Admitting: Family Medicine

## 2014-11-30 ENCOUNTER — Encounter: Payer: Self-pay | Admitting: Family Medicine

## 2014-11-30 VITALS — BP 140/80 | HR 78 | Temp 98.1°F | Resp 18 | Ht 70.0 in | Wt 190.0 lb

## 2014-11-30 DIAGNOSIS — I5021 Acute systolic (congestive) heart failure: Secondary | ICD-10-CM | POA: Diagnosis not present

## 2014-11-30 DIAGNOSIS — M6281 Muscle weakness (generalized): Secondary | ICD-10-CM | POA: Diagnosis not present

## 2014-11-30 DIAGNOSIS — R278 Other lack of coordination: Secondary | ICD-10-CM | POA: Diagnosis not present

## 2014-11-30 NOTE — Progress Notes (Signed)
Subjective:    Patient ID: Jason [name], male    DOB: 16-Jul-1919, 81 y.o.   MRN: [id number]  HPI 11/26/14 Discharged from hospital 9/8 for CHF and fluid retention on lasix 40 mg poqday.  I was informed Tuesday he had gained 5 lbs since discharge.  At that time, I increased lasix to 40 bid and asked to see the patient today.   Wt Readings from Last 3 Encounters:  11/26/14 195 lb (88.451 kg)  11/26/14 195 lb (88.451 kg)  11/19/14 185 lb 4.8 oz (84.052 kg)   He has +2 pitting edema to the level of his knees. He denies any cough or shortness of breath.  At that time, my plan was: Increase Lasix to 80 mg by mouth twice a day. Begin K-Dur 20 milliequivalents by mouth daily. Recheck on Monday or immediately if worse. At that time I'll likely discontinue amlodipine as this may be contributing to fluid retention. May also need to readdress the prednisone as it could be contributing to fluid retention.  11/30/14 Weight is down 5 lbs since 9/15.  The patient's breathing is better. The swelling in his left leg is essentially resolved. He still has +1 pitting edema in his right leg to his knee. I believe his dry weight is probably close to 187/188 pounds. Blood 185 pounds was recorded in the hospital wearing nothing but a hospital gown. Therefore I believe he needs to lose an additional 2-3 pounds of fluid Past Medical History  Diagnosis Date  . PVD (peripheral vascular disease)   . CAD (coronary artery disease)     a. 12/2005 - PCI to OM  . Hyperlipidemia   . Cerebrovascular accident   . DVT femoral (deep venous thrombosis) with thrombophlebitis 03/2007    [name] 07/13/2010  . GERD (gastroesophageal reflux disease)   . Edema   . Anxiety   . Vertigo   . IBS (irritable bowel syndrome)   . Duodenitis   . Gastritis   . Esophageal stricture   . Cellulitis   . Pulmonary embolism   . Prostatic hypertrophy   . Frequent falls   . Subdural hematoma   . PTSD (post-traumatic stress disorder)   . Vertigo    . TIA (transient ischemic attack)     a. 01/2006 and 05/2006.   [name] Hiatal hernia     periferal neuropathy  . Hx of echocardiogram     Echo 9/13:  Mild LVH, EF 60-65%, Gr 1 diast dysfn, mild AI, mild BAE  . Gout   . Chronic low back pain   . Gait disorder   . Peripheral neuropathy   . Hydrocele   . History of shingles     Left lumbar  . HOH (hard of hearing)     hearing aid, totally in R ear ( no hearing aid in L )   . Myocardial infarction "years ago"    "dr said I'd had a small one"  . Daily headache   . Depression   . Urinary frequency   . Blind left eye   . Hypertension   . Dysrhythmia   . Arthritis    Past Surgical History  Procedure Laterality Date  . Hand surgery Right     "replaced bone in my finger"  . Angioplasty    . Carotid endarterectomy Left   . Coronary angioplasty with stent placement      left circumflex  . Cataract extraction, bilateral    . Tonsillectomy    .  Tympanic membrane repair Right 1978    [name] 07/27/2010; "couldn'getting so t couldn't hear; had noise in my ear; didn't correct either  . Hand contracture release Right     palm/notes 07/27/2010  . Esophagogastroduodenoscopy (egd) with esophageal dilation    . Insertion of vena cava filter  2013    h/o PE  . Endarterectomy Left 11/02/2014    Procedure: LEFT  CAROTID ARTERY ENDARTERECTOMY  WITH DACRON PATCH ANGIOPLASTY;  Surgeon: Sherren Kerns, MD;  Location: [facility] OR;  Service: Vascular;  Laterality: Left;   Current Outpatient Prescriptions on File Prior to Visit  Medication Sig Dispense Refill  . acetaminophen (TYLENOL) 500 MG tablet Take 500 mg by mouth every 4 (four) hours as needed for fever.    . ALPRAZolam (XANAX) 0.25 MG tablet Take 1 tablet (0.25 mg total) by mouth 2 (two) times daily as needed for anxiety. 10 tablet 0  . alum & mag hydroxide-simeth (MAALOX/MYLANTA) 200-200-20 MG/5ML suspension Take 30 mLs by mouth every 6 (six) hours as needed for indigestion or heartburn.    [name]  amLODipine (NORVASC) 10 MG tablet Take 1 tablet (10 mg total) by mouth daily. 90 tablet 3  . DEXILANT 60 MG capsule TAKE ONE CAPSULE BY MOUTH EVERY DAY 30 capsule 11  . diclofenac sodium (VOLTAREN) 1 % GEL Apply 2 g topically 4 (four) times daily. 100 g 5  . docusate sodium (COLACE) 100 MG capsule Take 1 capsule (100 mg total) by mouth at bedtime. 30 capsule 11  . DULoxetine (CYMBALTA) 30 MG capsule TAKE ONE CAPSULE BY MOUTH EVERY DAY 30 capsule 6  . furosemide (LASIX) 80 MG tablet TAKE 1 TABLET BY MOUTH TWICE DAILY 180 tablet 0  . HYDROcodone-acetaminophen (NORCO) 5-325 MG per tablet Take 1 tablet by mouth every 6 (six) hours as needed for moderate pain or severe pain. 20 tablet 0  . latanoprost (XALATAN) 0.005 % ophthalmic solution Place 1 drop into both eyes at bedtime.    [name] loperamide (IMODIUM) 2 MG capsule Take 2 mg by mouth as needed for diarrhea or loose stools (Not to exceed 8 doses in 24 hours).    . magnesium hydroxide (MILK OF MAGNESIA) 400 MG/5ML suspension Take 30 mLs by mouth at bedtime as needed for mild constipation.    . meclizine (ANTIVERT) 12.5 MG tablet Take 12.5 mg by mouth every 12 (twelve) hours as needed for dizziness.    . mupirocin ointment (BACTROBAN) 2 % 1 Application, nasally, 2 times daily for 5 days prior to surgery 22 g 0  . MYRBETRIQ 25 MG TB24 tablet TAKE 1 TABLET BY MOUTH EVERY DAY 30 tablet 11  . nitroGLYCERIN (NITROSTAT) 0.4 MG SL tablet Place 0.4 mg under the tongue every 5 (five) minutes as needed. Chest pain    . ondansetron (ZOFRAN) 4 MG tablet Take 4 mg by mouth every 8 (eight) hours as needed for nausea.    [name] oxyCODONE-acetaminophen (PERCOCET/ROXICET) 5-325 MG per tablet TK 1 T PO Q 6 H PRN FOR SEVERE PAIN  0  . polyethylene glycol powder (GLYCOLAX/MIRALAX) powder MIX 17 GRAMS WITH 4 TO 6 OUNCES OF WATER AND DRINK ONCE DAILY AS NEEDED FOR CONSTIPATION 527 g 2  . polyvinyl alcohol (LIQUIFILM TEARS) 1.4 % ophthalmic solution Place 2 drops into both eyes 3  (three) times daily.    . potassium chloride SA (K-DUR,KLOR-CON) 20 MEQ tablet Take 20 mEq by mouth daily.    . predniSONE (DELTASONE) 10 MG tablet Take 1 tablet (10 mg  total) by mouth daily with breakfast. 30 tablet 5  . simvastatin (ZOCOR) 10 MG tablet Take 1 tablet (10 mg total) by mouth daily at 6 PM. 30 tablet 11  . tamsulosin (FLOMAX) 0.4 MG CAPS capsule Take 0.4 mg by mouth daily.    [name] 15 MG TABS tablet TAKE 1 TABLET BY MOUTH EVERY DAY WITH SUPPER 30 tablet 6   No current facility-administered medications on file prior to visit.   Allergies  Allergen Reactions  . Ativan [Lorazepam] Other (See Comments)    unknown  . Augmentin [Amoxicillin-Pot Clavulanate] Nausea And Vomiting    daughter states he can tolerate Amoxicillin ok Profuse vomiting  . Fludrocortisone Acetate Other (See Comments)    "leg swelling"  . Scopace [Scopolamine] Other (See Comments)    *patch "Made him crazy" per patients daughter    Social History   Social History  . Marital Status: Married    Spouse Name: N/A  . Number of Children: 2  . Years of Education: HS   Occupational History  . retired    Social History Main Topics  . Smoking status: Never Smoker   . Smokeless tobacco: Never Used  . Alcohol Use: 0.0 oz/week    0 Standard drinks or equivalent per week     Comment: wine occasionally  . Drug Use: No  . Sexual Activity: No   Other Topics Concern  . 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   Patient drinks about 2 cups of caffeine daily.     Review of Systems  All other systems reviewed and are negative.      Objective:   Physical Exam  Cardiovascular: Normal rate, regular rhythm and normal heart sounds.   Pulmonary/Chest: Effort normal and breath sounds normal. No respiratory distress. He has no wheezes. He has no rales.  Abdominal: Soft. Bowel sounds are normal. He exhibits no  distension. There is no tenderness. There is no rebound and no guarding.  Musculoskeletal: He exhibits edema.  Vitals reviewed.         Assessment & Plan:  Acute systolic congestive heart failure - Plan: BASIC METABOLIC PANEL WITH GFR  Decrease Lasix to 40 mg by mouth twice a day. Continue potassium. Discontinue amlodipine. Check BMP. Recheck in one week or sooner if worse

## 2014-12-01 ENCOUNTER — Inpatient Hospital Stay: Payer: Medicare Other | Admitting: Family Medicine

## 2014-12-01 DIAGNOSIS — M6281 Muscle weakness (generalized): Secondary | ICD-10-CM | POA: Diagnosis not present

## 2014-12-01 DIAGNOSIS — R278 Other lack of coordination: Secondary | ICD-10-CM | POA: Diagnosis not present

## 2014-12-01 LAB — BASIC METABOLIC PANEL WITH GFR
BUN: 38 mg/dL — AB (ref 7–25)
CALCIUM: 8.8 mg/dL (ref 8.6–10.3)
CO2: 27 mmol/L (ref 20–31)
Chloride: 101 mmol/L (ref 98–110)
Creat: 1.68 mg/dL — ABNORMAL HIGH (ref 0.70–1.11)
GFR, EST AFRICAN AMERICAN: 40 mL/min — AB (ref >=60)
GFR, EST NON AFRICAN AMERICAN: 34 mL/min — AB (ref >=60)
GLUCOSE: 110 mg/dL — AB (ref 70–99)
POTASSIUM: 4.2 mmol/L (ref 3.5–5.3)
Sodium: 140 mmol/L (ref 135–146)

## 2014-12-02 DIAGNOSIS — R278 Other lack of coordination: Secondary | ICD-10-CM | POA: Diagnosis not present

## 2014-12-02 DIAGNOSIS — M6281 Muscle weakness (generalized): Secondary | ICD-10-CM | POA: Diagnosis not present

## 2014-12-03 DIAGNOSIS — M6281 Muscle weakness (generalized): Secondary | ICD-10-CM | POA: Diagnosis not present

## 2014-12-03 DIAGNOSIS — R278 Other lack of coordination: Secondary | ICD-10-CM | POA: Diagnosis not present

## 2014-12-05 DIAGNOSIS — R278 Other lack of coordination: Secondary | ICD-10-CM | POA: Diagnosis not present

## 2014-12-05 DIAGNOSIS — M6281 Muscle weakness (generalized): Secondary | ICD-10-CM | POA: Diagnosis not present

## 2014-12-06 DIAGNOSIS — M6281 Muscle weakness (generalized): Secondary | ICD-10-CM | POA: Diagnosis not present

## 2014-12-06 DIAGNOSIS — R278 Other lack of coordination: Secondary | ICD-10-CM | POA: Diagnosis not present

## 2014-12-07 ENCOUNTER — Encounter: Payer: Self-pay | Admitting: Family Medicine

## 2014-12-07 ENCOUNTER — Ambulatory Visit (INDEPENDENT_AMBULATORY_CARE_PROVIDER_SITE_OTHER): Payer: Medicare Other | Admitting: Family Medicine

## 2014-12-07 VITALS — BP 152/84 | HR 68 | Temp 98.0°F | Resp 18 | Ht 70.0 in | Wt 187.0 lb

## 2014-12-07 DIAGNOSIS — I10 Essential (primary) hypertension: Secondary | ICD-10-CM

## 2014-12-07 DIAGNOSIS — I5021 Acute systolic (congestive) heart failure: Secondary | ICD-10-CM

## 2014-12-07 DIAGNOSIS — R278 Other lack of coordination: Secondary | ICD-10-CM | POA: Diagnosis not present

## 2014-12-07 DIAGNOSIS — M6281 Muscle weakness (generalized): Secondary | ICD-10-CM | POA: Diagnosis not present

## 2014-12-07 NOTE — Progress Notes (Signed)
Subjective:    Patient ID: Jason [name], male    DOB: 03/03/1920, 81 y.o..   MRN: [id number]  HPI 11/26/14 Discharged from hospital 9/8 for CHF and fluid retention on lasix 40 mg poqday.  I was informed Tuesday he had gained 5 lbs since discharge.  At that time, I increased lasix to 40 bid and asked to see the patient today.   Wt Readings from Last 3 Encounters:  11/30/14 190 lb (86.183 kg)  11/26/14 195 lb (88.451 kg)  11/26/14 195 lb (88.451 kg)   He has +2 pitting edema to the level of his knees. He denies any cough or shortness of breath.  At that time, my plan was: Increase Lasix to 80 mg by mouth twice a day. Begin K-Dur 20 milliequivalents by mouth daily. Recheck on Monday or immediately if worse. At that time I'll likely discontinue amlodipine as this may be contributing to fluid retention. May also need to readdress the prednisone as it could be contributing to fluid retention.  11/30/14 Weight is down 5 lbs since 9/15.  The patient's breathing is better. The swelling in his left leg is essentially resolved. He still has +1 pitting edema in his right leg to his knee. I believe his dry weight is probably close to 187/188 pounds. 185 pounds was recorded in the hospital wearing nothing but a hospital gown. Therefore I believe he needs to lose an additional 2-3 pounds of fluid.  At that time, my plan was: Decrease Lasix to 40 mg by mouth twice a day. Continue potassium. Discontinue amlodipine. Check BMP. Recheck in one week or sooner if worse  12/07/14 Here for follow up.  Patient's weight is down an additional 3 pounds. There is no further edema in his legs.  His breathing is better.  His blood pressure is still elevated Past Medical History  Diagnosis Date  . PVD (peripheral vascular disease)   . CAD (coronary artery disease)     a. 12/2005 - PCI to OM  . Hyperlipidemia   . Cerebrovascular accident   . DVT femoral (deep venous thrombosis) with thrombophlebitis 03/2007    [name]  07/13/2010  . GERD (gastroesophageal reflux disease)   . Edema   . Anxiety   . Vertigo   . IBS (irritable bowel syndrome)   . Duodenitis   . Gastritis   . Esophageal stricture   . Cellulitis   . Pulmonary embolism   . Prostatic hypertrophy   . Frequent falls   . Subdural hematoma   . PTSD (post-traumatic stress disorder)   . Vertigo   . TIA (transient ischemic attack)     a. 01/2006 and 05/2006.   [name] Hiatal hernia     periferal neuropathy  . Hx of echocardiogram     Echo 9/13:  Mild LVH, EF 60-65%, Gr 1 diast dysfn, mild AI, mild BAE  . Gout   . Chronic low back pain   . Gait disorder   . Peripheral neuropathy   . Hydrocele   . History of shingles     Left lumbar  . HOH (hard of hearing)     hearing aid, totally in R ear ( no hearing aid in L )   . Myocardial infarction "years ago"    "dr said I'd had a small one"  . Daily headache   . Depression   . Urinary frequency   . Blind left eye   . Hypertension   . Dysrhythmia   . Arthritis  Past Surgical History  Procedure Laterality Date  . Hand surgery Right     "replaced bone in my finger"  . Angioplasty    . Carotid endarterectomy Left   . Coronary angioplasty with stent placement      left circumflex  . Cataract extraction, bilateral    . Tonsillectomy    . Tympanic membrane repair Right 1978    [name] 07/27/2010; "couldn'getting so t couldn't hear; had noise in my ear; didn't correct either  . Hand contracture release Right     palm/notes 07/27/2010  . Esophagogastroduodenoscopy (egd) with esophageal dilation    . Insertion of vena cava filter  2013    h/o PE  . Endarterectomy Left 11/02/2014    Procedure: LEFT  CAROTID ARTERY ENDARTERECTOMY  WITH DACRON PATCH ANGIOPLASTY;  Surgeon: Sherren Kerns, MD;  Location: [facility] OR;  Service: Vascular;  Laterality: Left;   Current Outpatient Prescriptions on File Prior to Visit  Medication Sig Dispense Refill  . acetaminophen (TYLENOL) 500 MG tablet Take 500 mg by mouth  every 4 (four) hours as needed for fever.    . ALPRAZolam (XANAX) 0.25 MG tablet Take 1 tablet (0.25 mg total) by mouth 2 (two) times daily as needed for anxiety. 10 tablet 0  . alum & mag hydroxide-simeth (MAALOX/MYLANTA) 200-200-20 MG/5ML suspension Take 30 mLs by mouth every 6 (six) hours as needed for indigestion or heartburn.    [name] amLODipine (NORVASC) 10 MG tablet Take 1 tablet (10 mg total) by mouth daily. 90 tablet 3  . DEXILANT 60 MG capsule TAKE ONE CAPSULE BY MOUTH EVERY DAY 30 capsule 11  . diclofenac sodium (VOLTAREN) 1 % GEL Apply 2 g topically 4 (four) times daily. 100 g 5  . docusate sodium (COLACE) 100 MG capsule Take 1 capsule (100 mg total) by mouth at bedtime. 30 capsule 11  . DULoxetine (CYMBALTA) 30 MG capsule TAKE ONE CAPSULE BY MOUTH EVERY DAY 30 capsule 6  . furosemide (LASIX) 80 MG tablet TAKE 1 TABLET BY MOUTH TWICE DAILY 180 tablet 0  . HYDROcodone-acetaminophen (NORCO) 5-325 MG per tablet Take 1 tablet by mouth every 6 (six) hours as needed for moderate pain or severe pain. 20 tablet 0  . latanoprost (XALATAN) 0.005 % ophthalmic solution Place 1 drop into both eyes at bedtime.    [name] loperamide (IMODIUM) 2 MG capsule Take 2 mg by mouth as needed for diarrhea or loose stools (Not to exceed 8 doses in 24 hours).    . magnesium hydroxide (MILK OF MAGNESIA) 400 MG/5ML suspension Take 30 mLs by mouth at bedtime as needed for mild constipation.    . meclizine (ANTIVERT) 12.5 MG tablet Take 12.5 mg by mouth every 12 (twelve) hours as needed for dizziness.    . mupirocin ointment (BACTROBAN) 2 % 1 Application, nasally, 2 times daily for 5 days prior to surgery 22 g 0  . MYRBETRIQ 25 MG TB24 tablet TAKE 1 TABLET BY MOUTH EVERY DAY 30 tablet 11  . nitroGLYCERIN (NITROSTAT) 0.4 MG SL tablet Place 0.4 mg under the tongue every 5 (five) minutes as needed. Chest pain    . ondansetron (ZOFRAN) 4 MG tablet Take 4 mg by mouth every 8 (eight) hours as needed for nausea.    [name]  oxyCODONE-acetaminophen (PERCOCET/ROXICET) 5-325 MG per tablet TK 1 T PO Q 6 H PRN FOR SEVERE PAIN  0  . polyethylene glycol powder (GLYCOLAX/MIRALAX) powder MIX 17 GRAMS WITH 4 TO 6 OUNCES OF WATER AND DRINK  ONCE DAILY AS NEEDED FOR CONSTIPATION 527 g 2  . polyvinyl alcohol (LIQUIFILM TEARS) 1.4 % ophthalmic solution Place 2 drops into both eyes 3 (three) times daily.    . potassium chloride SA (K-DUR,KLOR-CON) 20 MEQ tablet Take 20 mEq by mouth daily.    . predniSONE (DELTASONE) 10 MG tablet Take 1 tablet (10 mg total) by mouth daily with breakfast. 30 tablet 5  . simvastatin (ZOCOR) 10 MG tablet Take 1 tablet (10 mg total) by mouth daily at 6 PM. 30 tablet 11  . tamsulosin (FLOMAX) 0.4 MG CAPS capsule Take 0.4 mg by mouth daily.    [name] 15 MG TABS tablet TAKE 1 TABLET BY MOUTH EVERY DAY WITH SUPPER 30 tablet 6   No current facility-administered medications on file prior to visit.   Allergies  Allergen Reactions  . Ativan [Lorazepam] Other (See Comments)    unknown  . Augmentin [Amoxicillin-Pot Clavulanate] Nausea And Vomiting    daughter states he can tolerate Amoxicillin ok Profuse vomiting  . Fludrocortisone Acetate Other (See Comments)    "leg swelling"  . Scopace [Scopolamine] Other (See Comments)    *patch "Made him crazy" per patients daughter    Social History   Social History  . Marital Status: Married    Spouse Name: N/A  . Number of Children: 2  . Years of Education: HS   Occupational History  . retired    Social History Main Topics  . Smoking status: Never Smoker   . Smokeless tobacco: Never Used  . Alcohol Use: 0.0 oz/week    0 Standard drinks or equivalent per week     Comment: wine occasionally  . Drug Use: No  . Sexual Activity: No   Other Topics Concern  . 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   Patient drinks about 2 cups of caffeine daily.      Review of Systems  All other systems reviewed and are negative.      Objective:   Physical Exam  Cardiovascular: Normal rate, regular rhythm and normal heart sounds.   Pulmonary/Chest: Effort normal and breath sounds normal. No respiratory distress. He has no wheezes. He has no rales.  Abdominal: Soft. Bowel sounds are normal. He exhibits no distension. There is no tenderness. There is no rebound and no guarding.  Musculoskeletal: He exhibits edema.  Vitals reviewed.         Assessment & Plan:  Acute systolic congestive heart failure  Benign essential HTN  Decrease lasix to 40 mg poqday.  DC potassium, start losartan 25 mg poqday and recheck bp and bmp in 1 month.

## 2014-12-09 DIAGNOSIS — R278 Other lack of coordination: Secondary | ICD-10-CM | POA: Diagnosis not present

## 2014-12-09 DIAGNOSIS — M6281 Muscle weakness (generalized): Secondary | ICD-10-CM | POA: Diagnosis not present

## 2014-12-10 DIAGNOSIS — R278 Other lack of coordination: Secondary | ICD-10-CM | POA: Diagnosis not present

## 2014-12-10 DIAGNOSIS — M6281 Muscle weakness (generalized): Secondary | ICD-10-CM | POA: Diagnosis not present

## 2014-12-11 DIAGNOSIS — R278 Other lack of coordination: Secondary | ICD-10-CM | POA: Diagnosis not present

## 2014-12-11 DIAGNOSIS — M6281 Muscle weakness (generalized): Secondary | ICD-10-CM | POA: Diagnosis not present

## 2014-12-12 DIAGNOSIS — I509 Heart failure, unspecified: Secondary | ICD-10-CM | POA: Diagnosis not present

## 2014-12-12 DIAGNOSIS — M6281 Muscle weakness (generalized): Secondary | ICD-10-CM | POA: Diagnosis not present

## 2014-12-12 DIAGNOSIS — R278 Other lack of coordination: Secondary | ICD-10-CM | POA: Diagnosis not present

## 2014-12-15 DIAGNOSIS — I509 Heart failure, unspecified: Secondary | ICD-10-CM | POA: Diagnosis not present

## 2014-12-15 DIAGNOSIS — R278 Other lack of coordination: Secondary | ICD-10-CM | POA: Diagnosis not present

## 2014-12-15 DIAGNOSIS — M6281 Muscle weakness (generalized): Secondary | ICD-10-CM | POA: Diagnosis not present

## 2014-12-17 DIAGNOSIS — R278 Other lack of coordination: Secondary | ICD-10-CM | POA: Diagnosis not present

## 2014-12-17 DIAGNOSIS — I509 Heart failure, unspecified: Secondary | ICD-10-CM | POA: Diagnosis not present

## 2014-12-17 DIAGNOSIS — M6281 Muscle weakness (generalized): Secondary | ICD-10-CM | POA: Diagnosis not present

## 2014-12-18 DIAGNOSIS — M6281 Muscle weakness (generalized): Secondary | ICD-10-CM | POA: Diagnosis not present

## 2014-12-18 DIAGNOSIS — R278 Other lack of coordination: Secondary | ICD-10-CM | POA: Diagnosis not present

## 2014-12-18 DIAGNOSIS — I509 Heart failure, unspecified: Secondary | ICD-10-CM | POA: Diagnosis not present

## 2014-12-20 DIAGNOSIS — R278 Other lack of coordination: Secondary | ICD-10-CM | POA: Diagnosis not present

## 2014-12-20 DIAGNOSIS — M6281 Muscle weakness (generalized): Secondary | ICD-10-CM | POA: Diagnosis not present

## 2014-12-20 DIAGNOSIS — I509 Heart failure, unspecified: Secondary | ICD-10-CM | POA: Diagnosis not present

## 2014-12-21 ENCOUNTER — Emergency Department (HOSPITAL_COMMUNITY): Payer: Medicare Other

## 2014-12-21 ENCOUNTER — Encounter (HOSPITAL_COMMUNITY): Payer: Self-pay

## 2014-12-21 ENCOUNTER — Telehealth: Payer: Self-pay | Admitting: Family Medicine

## 2014-12-21 ENCOUNTER — Inpatient Hospital Stay (HOSPITAL_COMMUNITY)
Admission: EM | Admit: 2014-12-21 | Discharge: 2014-12-23 | DRG: 065 | Disposition: A | Discharge status: Skilled Nursing Facility | Payer: Medicare Other | Attending: Internal Medicine | Admitting: Internal Medicine

## 2014-12-21 DIAGNOSIS — G609 Hereditary and idiopathic neuropathy, unspecified: Secondary | ICD-10-CM | POA: Diagnosis not present

## 2014-12-21 DIAGNOSIS — I509 Heart failure, unspecified: Secondary | ICD-10-CM

## 2014-12-21 DIAGNOSIS — N183 Chronic kidney disease, stage 3 unspecified: Secondary | ICD-10-CM | POA: Diagnosis present

## 2014-12-21 DIAGNOSIS — I10 Essential (primary) hypertension: Secondary | ICD-10-CM | POA: Diagnosis not present

## 2014-12-21 DIAGNOSIS — F4310 Post-traumatic stress disorder, unspecified: Secondary | ICD-10-CM | POA: Diagnosis not present

## 2014-12-21 DIAGNOSIS — Z7901 Long term (current) use of anticoagulants: Secondary | ICD-10-CM

## 2014-12-21 DIAGNOSIS — Z8673 Personal history of transient ischemic attack (TIA), and cerebral infarction without residual deficits: Secondary | ICD-10-CM | POA: Diagnosis not present

## 2014-12-21 DIAGNOSIS — Z86711 Personal history of pulmonary embolism: Secondary | ICD-10-CM

## 2014-12-21 DIAGNOSIS — Z79899 Other long term (current) drug therapy: Secondary | ICD-10-CM

## 2014-12-21 DIAGNOSIS — R4781 Slurred speech: Secondary | ICD-10-CM | POA: Diagnosis not present

## 2014-12-21 DIAGNOSIS — I480 Paroxysmal atrial fibrillation: Secondary | ICD-10-CM | POA: Diagnosis present

## 2014-12-21 DIAGNOSIS — F329 Major depressive disorder, single episode, unspecified: Secondary | ICD-10-CM | POA: Diagnosis present

## 2014-12-21 DIAGNOSIS — F419 Anxiety disorder, unspecified: Secondary | ICD-10-CM | POA: Diagnosis present

## 2014-12-21 DIAGNOSIS — N400 Enlarged prostate without lower urinary tract symptoms: Secondary | ICD-10-CM | POA: Diagnosis present

## 2014-12-21 DIAGNOSIS — M1990 Unspecified osteoarthritis, unspecified site: Secondary | ICD-10-CM | POA: Diagnosis present

## 2014-12-21 DIAGNOSIS — E785 Hyperlipidemia, unspecified: Secondary | ICD-10-CM | POA: Insufficient documentation

## 2014-12-21 DIAGNOSIS — I130 Hypertensive heart and chronic kidney disease with heart failure and stage 1 through stage 4 chronic kidney disease, or unspecified chronic kidney disease: Secondary | ICD-10-CM | POA: Diagnosis present

## 2014-12-21 DIAGNOSIS — I5032 Chronic diastolic (congestive) heart failure: Secondary | ICD-10-CM | POA: Diagnosis not present

## 2014-12-21 DIAGNOSIS — G8929 Other chronic pain: Secondary | ICD-10-CM | POA: Diagnosis present

## 2014-12-21 DIAGNOSIS — R471 Dysarthria and anarthria: Secondary | ICD-10-CM | POA: Diagnosis present

## 2014-12-21 DIAGNOSIS — N179 Acute kidney failure, unspecified: Secondary | ICD-10-CM | POA: Diagnosis present

## 2014-12-21 DIAGNOSIS — H5442 Blindness, left eye, normal vision right eye: Secondary | ICD-10-CM | POA: Diagnosis present

## 2014-12-21 DIAGNOSIS — R29810 Facial weakness: Secondary | ICD-10-CM | POA: Diagnosis not present

## 2014-12-21 DIAGNOSIS — K589 Irritable bowel syndrome without diarrhea: Secondary | ICD-10-CM | POA: Diagnosis present

## 2014-12-21 DIAGNOSIS — I639 Cerebral infarction, unspecified: Secondary | ICD-10-CM

## 2014-12-21 DIAGNOSIS — K219 Gastro-esophageal reflux disease without esophagitis: Secondary | ICD-10-CM | POA: Diagnosis not present

## 2014-12-21 DIAGNOSIS — Z86718 Personal history of other venous thrombosis and embolism: Secondary | ICD-10-CM | POA: Diagnosis not present

## 2014-12-21 DIAGNOSIS — Z9842 Cataract extraction status, left eye: Secondary | ICD-10-CM

## 2014-12-21 DIAGNOSIS — I252 Old myocardial infarction: Secondary | ICD-10-CM

## 2014-12-21 DIAGNOSIS — I739 Peripheral vascular disease, unspecified: Secondary | ICD-10-CM | POA: Diagnosis present

## 2014-12-21 DIAGNOSIS — M109 Gout, unspecified: Secondary | ICD-10-CM | POA: Diagnosis present

## 2014-12-21 DIAGNOSIS — I2510 Atherosclerotic heart disease of native coronary artery without angina pectoris: Secondary | ICD-10-CM | POA: Diagnosis not present

## 2014-12-21 DIAGNOSIS — I6789 Other cerebrovascular disease: Secondary | ICD-10-CM | POA: Diagnosis not present

## 2014-12-21 DIAGNOSIS — Z9841 Cataract extraction status, right eye: Secondary | ICD-10-CM

## 2014-12-21 DIAGNOSIS — H9190 Unspecified hearing loss, unspecified ear: Secondary | ICD-10-CM | POA: Diagnosis present

## 2014-12-21 DIAGNOSIS — Z955 Presence of coronary angioplasty implant and graft: Secondary | ICD-10-CM

## 2014-12-21 DIAGNOSIS — M545 Low back pain: Secondary | ICD-10-CM | POA: Diagnosis present

## 2014-12-21 DIAGNOSIS — Z8672 Personal history of thrombophlebitis: Secondary | ICD-10-CM

## 2014-12-21 DIAGNOSIS — R531 Weakness: Secondary | ICD-10-CM | POA: Diagnosis not present

## 2014-12-21 LAB — COMPREHENSIVE METABOLIC PANEL
ALK PHOS: 55 U/L (ref 38–126)
ALT: 13 U/L — AB (ref 17–63)
ANION GAP: 10 (ref 5–15)
AST: 19 U/L (ref 15–41)
Albumin: 3.4 g/dL — ABNORMAL LOW (ref 3.5–5.0)
BILIRUBIN TOTAL: 0.5 mg/dL (ref 0.3–1.2)
BUN: 45 mg/dL — ABNORMAL HIGH (ref 6–20)
CALCIUM: 8.5 mg/dL — AB (ref 8.9–10.3)
CO2: 28 mmol/L (ref 22–32)
CREATININE: 2.13 mg/dL — AB (ref 0.61–1.24)
Chloride: 98 mmol/L — ABNORMAL LOW (ref 101–111)
GFR, EST AFRICAN AMERICAN: 29 mL/min — AB (ref >60)
GFR, EST NON AFRICAN AMERICAN: 25 mL/min — AB (ref >60)
Glucose, Bld: 87 mg/dL (ref 65–99)
Potassium: 4 mmol/L (ref 3.5–5.1)
SODIUM: 136 mmol/L (ref 135–145)
TOTAL PROTEIN: 6.5 g/dL (ref 6.5–8.1)

## 2014-12-21 LAB — DIFFERENTIAL
Basophils Absolute: 0 10*3/uL (ref 0.0–0.1)
Basophils Relative: 0 %
EOS PCT: 2 %
Eosinophils Absolute: 0.1 10*3/uL (ref 0.0–0.7)
LYMPHS PCT: 18 %
Lymphs Abs: 1.6 10*3/uL (ref 0.7–4.0)
MONO ABS: 1 10*3/uL (ref 0.1–1.0)
MONOS PCT: 12 %
Neutro Abs: 6.1 10*3/uL (ref 1.7–7.7)
Neutrophils Relative %: 68 %

## 2014-12-21 LAB — I-STAT TROPONIN, ED: TROPONIN I, POC: 0.03 ng/mL (ref 0.00–0.08)

## 2014-12-21 LAB — URINALYSIS, ROUTINE W REFLEX MICROSCOPIC
BILIRUBIN URINE: NEGATIVE
GLUCOSE, UA: NEGATIVE mg/dL
HGB URINE DIPSTICK: NEGATIVE
KETONES UR: NEGATIVE mg/dL
Leukocytes, UA: NEGATIVE
Nitrite: NEGATIVE
PH: 5.5 (ref 5.0–8.0)
PROTEIN: NEGATIVE mg/dL
Specific Gravity, Urine: 1.01 (ref 1.005–1.030)
Urobilinogen, UA: 0.2 mg/dL (ref 0.0–1.0)

## 2014-12-21 LAB — CBC
HEMATOCRIT: 40.1 % (ref 39.0–52.0)
Hemoglobin: 13.2 g/dL (ref 13.0–17.0)
MCH: 32.2 pg (ref 26.0–34.0)
MCHC: 32.9 g/dL (ref 30.0–36.0)
MCV: 97.8 fL (ref 78.0–100.0)
PLATELETS: 196 10*3/uL (ref 150–400)
RBC: 4.1 MIL/uL — AB (ref 4.22–5.81)
RDW: 15.2 % (ref 11.5–15.5)
WBC: 8.8 10*3/uL (ref 4.0–10.5)

## 2014-12-21 LAB — APTT: aPTT: 34 seconds (ref 24–37)

## 2014-12-21 LAB — PROTIME-INR
INR: 1.65 — ABNORMAL HIGH (ref 0.00–1.49)
Prothrombin Time: 19.5 seconds — ABNORMAL HIGH (ref 11.6–15.2)

## 2014-12-21 NOTE — ED Notes (Signed)
Per EMS: Pt from [facility]. Pt reports waking up 12/20/14 with weakness and slurred speech. Family saw pt today and noticed some slurred speech and L sided facial droop. Pt has hx. of TIA, no stroke. Pt a/o x 4.

## 2014-12-21 NOTE — ED Provider Notes (Signed)
CSN: [id number]     Arrival date & time 12/21/14  1851 History   First MD Initiated Contact with Patient 12/21/14 1856     Chief Complaint  Patient presents with  . Aphasia     (Consider location/radiation/quality/duration/timing/severity/associated sxs/prior Treatment) Patient is a 81 y.o. male presenting with Acute Neurological Problem. The history is provided by the patient and a relative.  Cerebrovascular Accident This is a new problem. The current episode started yesterday. The problem occurs constantly. The problem has not changed since onset.Pertinent negatives include no headaches and no shortness of breath. Associated symptoms comments: Right lower corner of his mouth feels heavy and he is slurring his words, increased dizziness and weakness. Nothing aggravates the symptoms. Nothing relieves the symptoms. He has tried nothing for the symptoms.    Past Medical History  Diagnosis Date  . PVD (peripheral vascular disease) (HCC)   . CAD (coronary artery disease)     a. 12/2005 - PCI to OM  . Hyperlipidemia   . Cerebrovascular accident (HCC)   . 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 GERD (gastroesophageal reflux disease)   . Edema   . Anxiety   . Vertigo   . IBS (irritable bowel syndrome)   . Duodenitis   . Gastritis   . Esophageal stricture   . Cellulitis   . Pulmonary embolism (HCC)   . Prostatic hypertrophy   . Frequent falls   . Subdural hematoma (HCC)   . PTSD (post-traumatic stress disorder)   . Vertigo   . TIA (transient ischemic attack)     a. 01/2006 and 05/2006.   [name] Hiatal hernia     periferal neuropathy  . Hx of echocardiogram     Echo 9/13:  Mild LVH, EF 60-65%, Gr 1 diast dysfn, mild AI, mild BAE  . Gout   . Chronic low back pain   . Gait disorder   . Peripheral neuropathy (HCC)   . Hydrocele   . History of shingles     Left lumbar  . HOH (hard of hearing)     hearing aid, totally in R ear (  no hearing aid in L )   . Myocardial infarction [facility]) "years ago"    "dr said I'd had a small one"  . Daily headache   . Depression   . Urinary frequency   . Blind left eye   . Hypertension   . Dysrhythmia   . Arthritis    Past Surgical History  Procedure Laterality Date  . Hand surgery Right     "replaced bone in my finger"  . Angioplasty    . Carotid endarterectomy Left   . Coronary angioplasty with stent placement      left circumflex  . Cataract extraction, bilateral    . Tonsillectomy    . Tympanic membrane repair Right 1978    [name] 07/27/2010; "couldn'getting so t couldn't hear; had noise in my ear; didn't correct either  . Hand contracture release Right     palm/notes 07/27/2010  . Esophagogastroduodenoscopy (egd) with esophageal dilation    . Insertion of vena cava filter  2013    h/o PE  . Endarterectomy Left 11/02/2014    Procedure: LEFT  CAROTID ARTERY ENDARTERECTOMY  WITH DACRON PATCH ANGIOPLASTY;  Surgeon: Sherren Kerns, MD;  Location: [facility] OR;  Service: Vascular;  Laterality: Left;   Family History  Problem Relation Age of Onset  . Colon cancer Neg  Hx   . Coronary artery disease Brother   . Heart attack Father   . Migraines Mother   . Breast cancer Sister   . Dementia Sister   . Renal Disease Brother    Social History  Substance Use Topics  . Smoking status: Never Smoker   . Smokeless tobacco: Never Used  . Alcohol Use: 0.0 oz/week    0 Standard drinks or equivalent per week     Comment: wine occasionally    Review of Systems  Respiratory: Negative for shortness of breath.   Neurological: Negative for headaches.  All other systems reviewed and are negative.     Allergies  Ativan; Augmentin; Fludrocortisone acetate; and Scopace  Home Medications   Prior to Admission medications   Medication Sig Start Date End Date Taking? Authorizing Provider  acetaminophen (TYLENOL) 500 MG tablet Take 1,000 mg by mouth 2 (two) times daily. Take every day  per [facility]   Yes Historical Provider, MD  ALPRAZolam (XANAX) 0.25 MG tablet Take 1 tablet (0.25 mg total) by mouth 2 (two) times daily as needed for anxiety. 11/19/14  Yes Osvaldo Shipper, MD  alum & mag hydroxide-simeth (MAALOX/MYLANTA) 200-200-20 MG/5ML suspension Take 30 mLs by mouth every 6 (six) hours as needed for indigestion or heartburn.   Yes Historical Provider, MD  DEXILANT 60 MG capsule TAKE ONE CAPSULE BY MOUTH EVERY DAY 03/16/14  Yes Donita Brooks, MD  diclofenac sodium (VOLTAREN) 1 % GEL Apply 2 g topically 4 (four) times daily. 06/22/14  Yes Donita Brooks, MD  docusate sodium (COLACE) 100 MG capsule Take 1 capsule (100 mg total) by mouth at bedtime. 07/14/14  Yes Donita Brooks, MD  DULoxetine (CYMBALTA) 30 MG capsule TAKE ONE CAPSULE BY MOUTH EVERY DAY 09/07/14  Yes York Spaniel, MD  furosemide (LASIX) 40 MG tablet Take 40 mg by mouth daily.   Yes Historical Provider, MD  HYDROcodone-acetaminophen (NORCO) 5-325 MG per tablet Take 1 tablet by mouth every 6 (six) hours as needed for moderate pain or severe pain. 11/19/14  Yes Osvaldo Shipper, MD  latanoprost (XALATAN) 0.005 % ophthalmic solution Place 1 drop into both eyes at bedtime.   Yes Historical Provider, MD  loperamide (IMODIUM) 2 MG capsule Take 2 mg by mouth as needed for diarrhea or loose stools (Not to exceed 8 doses in 24 hours).   Yes Historical Provider, MD  losartan (COZAAR) 25 MG tablet Take 25 mg by mouth daily.   Yes Historical Provider, MD  magnesium hydroxide (MILK OF MAGNESIA) 400 MG/5ML suspension Take 30 mLs by mouth at bedtime as needed for mild constipation.   Yes Historical Provider, MD  meclizine (ANTIVERT) 12.5 MG tablet Take 12.5 mg by mouth every 12 (twelve) hours as needed for dizziness.   Yes Historical Provider, MD  mupirocin ointment (BACTROBAN) 2 % 1 Application, nasally, 2 times daily for 5 days prior to surgery 10/30/14  Yes Sherren Kerns, MD  MYRBETRIQ 25 MG TB24 tablet TAKE 1 TABLET BY MOUTH EVERY  DAY 07/13/14  Yes Donita Brooks, MD  nitroGLYCERIN (NITROSTAT) 0.4 MG SL tablet Place 0.4 mg under the tongue every 5 (five) minutes as needed. Chest pain   Yes Historical Provider, MD  ondansetron (ZOFRAN) 4 MG tablet Take 4 mg by mouth every 8 (eight) hours as needed for nausea.   Yes Historical Provider, MD  polyethylene glycol powder (GLYCOLAX/MIRALAX) powder MIX 17 GRAMS WITH 4 TO 6 OUNCES OF WATER AND DRINK ONCE DAILY AS  NEEDED FOR CONSTIPATION 03/07/13  Yes Donita Brooks, MD  polyvinyl alcohol (LIQUIFILM TEARS) 1.4 % ophthalmic solution Place 2 drops into both eyes 3 (three) times daily.   Yes Historical Provider, MD  predniSONE (DELTASONE) 10 MG tablet Take 1 tablet (10 mg total) by mouth daily with breakfast. 09/22/14  Yes Donita Brooks, MD  simvastatin (ZOCOR) 10 MG tablet Take 1 tablet (10 mg total) by mouth daily at 6 PM. 11/03/14  Yes Raymond Gurney, PA-C  tamsulosin (FLOMAX) 0.4 MG CAPS capsule Take 0.4 mg by mouth daily.   Yes Historical Provider, MD  XARELTO 15 MG TABS tablet TAKE 1 TABLET BY MOUTH EVERY DAY WITH SUPPER 10/06/14  Yes Tonny Bollman, MD  amLODipine (NORVASC) 10 MG tablet Take 1 tablet (10 mg total) by mouth daily. Patient not taking: Reported on 12/21/2014 10/27/14   Tonny Bollman, MD  furosemide (LASIX) 80 MG tablet TAKE 1 TABLET BY MOUTH TWICE DAILY Patient not taking: Reported on 12/21/2014 11/26/14   Donita Brooks, MD   BP 171/67 mmHg  Pulse 60  Resp 19  SpO2 92% Physical Exam  Constitutional: He is oriented to person, place, and time. He appears well-developed and well-nourished. No distress.  HENT:  Head: Normocephalic and atraumatic.  Eyes: Conjunctivae are normal.  Neck: Neck supple. No tracheal deviation present.  Cardiovascular: Normal rate and regular rhythm.   Pulmonary/Chest: Effort normal. No respiratory distress.  Abdominal: Soft. He exhibits no distension.  Neurological: He is alert and oriented to person, place, and time. He has  normal strength. GCS eye subscore is 4. GCS verbal subscore is 5. GCS motor subscore is 6.  Difficulty smiling without significant asymmetry, cranial nerve V sensory and motor distributions intact  Skin: Skin is warm and dry.  Psychiatric: He has a normal mood and affect. His speech is slurred.    ED Course  Procedures (including critical care time) Labs Review Labs Reviewed  PROTIME-INR - Abnormal; Notable for the following:    Prothrombin Time 19.5 (*)    INR 1.65 (*)    All other components within normal limits  CBC - Abnormal; Notable for the following:    RBC 4.10 (*)    All other components within normal limits  COMPREHENSIVE METABOLIC PANEL - Abnormal; Notable for the following:    Chloride 98 (*)    BUN 45 (*)    Creatinine, Ser 2.13 (*)    Calcium 8.5 (*)    Albumin 3.4 (*)    ALT 13 (*)    GFR calc non Af Amer 25 (*)    GFR calc Af Amer 29 (*)    All other components within normal limits  APTT  DIFFERENTIAL  URINALYSIS, ROUTINE W REFLEX MICROSCOPIC (NOT AT [facility])  Rosezena Sensor, ED    Imaging Review Dg Chest 2 View  12/21/2014   CLINICAL DATA:  Slurred speech and weakness tonight.  EXAM: CHEST  2 VIEW  COMPARISON:  11/16/2014  FINDINGS: Normal heart size and pulmonary vascularity. No focal airspace disease or consolidation in the lungs. No blunting of costophrenic angles. No pneumothorax. Mediastinal contours appear intact. Degenerative changes in the spine and shoulders. Calcification of the aorta.  IMPRESSION: No active cardiopulmonary disease.   Electronically Signed   By: Burman Nieves M.D.   On: 12/21/2014 22:39   Ct Head Wo Contrast  12/21/2014   CLINICAL DATA:  81 year old male with acute weakness and slurred speech for 1 day.  EXAM: CT HEAD WITHOUT CONTRAST  TECHNIQUE: Contiguous axial images were obtained from the base of the skull through the vertex without intravenous contrast.  COMPARISON:  05/22/2014 and prior exams  FINDINGS: Atrophy, chronic  small-vessel white matter ischemic changes again noted. Remote right caudate and high bilateral frontoparietal infarcts again identified.  No acute intracranial abnormalities are identified, including mass lesion or mass effect, hydrocephalus, extra-axial fluid collection, midline shift, hemorrhage, or acute infarction.  The visualized bony calvarium is unremarkable.  IMPRESSION: No evidence of acute intracranial abnormality.  Atrophy, chronic small-vessel white matter ischemic changes and remote infarcts as described.   Electronically Signed   By: Harmon Pier M.D.   On: 12/21/2014 19:58   Mr Brain Wo Contrast  12/21/2014   CLINICAL DATA:  Initial evaluation for acute slurred speech, left-sided facial droop.  EXAM: MRI HEAD WITHOUT CONTRAST  TECHNIQUE: Multiplanar, multiecho pulse sequences of the brain and surrounding structures were obtained without intravenous contrast.  COMPARISON:  Prior CT from earlier the same day.  FINDINGS: Patchy area of restricted diffusion within the deep white matter of the right corona radiata measures 13 mm. Corresponding signal loss seen on ADC map. Additional 7 mm area of restricted diffusion within the left cerebellar hemisphere, also consistent with acute ischemic infarct (series 5, image 10). No associated hemorrhage or mass effect. No other infarct. Normal intravascular flow voids maintained.  Diffuse prominence of the CSF containing spaces is compatible with generalized age-related cerebral atrophy. There is encephalomalacia with gliosis within the high parietal regions bilaterally, likely related to remote ischemia. Small amount of chronic hemosiderin staining within the posterior right frontal parietal region. Small remote lacunar infarct within the right caudate head. Small remote cortical infarct within the right frontal lobe present as well.  No mass lesion, midline shift, or mass effect. No hydrocephalus. No extra-axial fluid collection.  Craniocervical junction  within normal limits. Pituitary gland normal.  No acute abnormality about the orbits. Sequela prior bilateral lens extraction noted.  Mild mucosal thickening within the ethmoidal air cells. Paranasal sinuses are otherwise clear. Scattered opacity within the inferior right mastoid air cells.  Bone marrow signal intensity within normal limits. No scalp soft tissue abnormality.  IMPRESSION: 1. 13 mm acute nonhemorrhagic ischemic infarct within the deep white matter of the right centrum semi ovale. 2. 7 mm acute nonhemorrhagic ischemic infarct within the right cerebellar hemisphere. 3. Encephalomalacia within the bilateral parietal lobes and right frontal lobe, likely related to remote ischemia. Additional remote lacunar infarct within the right caudate head. 4. Generalized age-related cerebral atrophy with chronic microvascular ischemic disease.   Electronically Signed   By: Rise Mu M.D.   On: 12/21/2014 23:58   I have personally reviewed and evaluated these images and lab results as part of my medical decision-making.   EKG Interpretation   Date/Time:  Monday December 21 2014 19:06:56 EDT Ventricular Rate:  62 PR Interval:  164 QRS Duration: 150 QT Interval:  436 QTC Calculation: 443 R Axis:   47 Text Interpretation:  Sinus rhythm Probable left atrial enlargement Right  bundle branch block Baseline wander in lead(s) II aVR aVF No significant  change since last tracing Confirmed by Klara Stjames MD, Reuel Boom ([id number]) on  12/21/2014 7:09:05 PM      MDM   Final diagnoses:  Acute ischemic stroke (HCC)    81 year old male with past medical history of TIA, status post endarterectomy, prior DVT, prior coronary artery disease, vertigo, hypertension and anticoagulated on Xarelto presents with onset of slurring of speech beginning yesterday, noticed  today by daughter. Unclear onset of symptoms and patient is not candidate for acute thrombolytics or other intervention because of this and his ongoing  anticoagulation. CT head negative, no focal neurologic deficits but patient does complain of left lower mouth numbness and has some slurring in his speech. He has been feeling increasingly weak and dizzy. Neurology was consulted and recommended MRI for high-risk patient to evaluate for ischemic stroke.  MRI is consistent with multiple lobe stroke distribution concerning for embolic etiology. Hospitalist was consulted for admission and will see the patient in the emergency department.    Lyndal Pulley, MD 12/22/14 [phone]

## 2014-12-22 ENCOUNTER — Ambulatory Visit: Payer: Medicare Other | Admitting: Family Medicine

## 2014-12-22 ENCOUNTER — Inpatient Hospital Stay (HOSPITAL_COMMUNITY): Payer: Medicare Other

## 2014-12-22 ENCOUNTER — Encounter (HOSPITAL_COMMUNITY): Payer: Self-pay | Admitting: Family Medicine

## 2014-12-22 DIAGNOSIS — I639 Cerebral infarction, unspecified: Secondary | ICD-10-CM

## 2014-12-22 DIAGNOSIS — N183 Chronic kidney disease, stage 3 (moderate): Secondary | ICD-10-CM

## 2014-12-22 DIAGNOSIS — I252 Old myocardial infarction: Secondary | ICD-10-CM | POA: Diagnosis not present

## 2014-12-22 DIAGNOSIS — Z86711 Personal history of pulmonary embolism: Secondary | ICD-10-CM | POA: Diagnosis not present

## 2014-12-22 DIAGNOSIS — K589 Irritable bowel syndrome without diarrhea: Secondary | ICD-10-CM | POA: Diagnosis present

## 2014-12-22 DIAGNOSIS — I5032 Chronic diastolic (congestive) heart failure: Secondary | ICD-10-CM

## 2014-12-22 DIAGNOSIS — Z79899 Other long term (current) drug therapy: Secondary | ICD-10-CM | POA: Diagnosis not present

## 2014-12-22 DIAGNOSIS — K219 Gastro-esophageal reflux disease without esophagitis: Secondary | ICD-10-CM | POA: Diagnosis present

## 2014-12-22 DIAGNOSIS — F419 Anxiety disorder, unspecified: Secondary | ICD-10-CM | POA: Diagnosis present

## 2014-12-22 DIAGNOSIS — M1990 Unspecified osteoarthritis, unspecified site: Secondary | ICD-10-CM | POA: Diagnosis present

## 2014-12-22 DIAGNOSIS — H9190 Unspecified hearing loss, unspecified ear: Secondary | ICD-10-CM | POA: Diagnosis present

## 2014-12-22 DIAGNOSIS — I130 Hypertensive heart and chronic kidney disease with heart failure and stage 1 through stage 4 chronic kidney disease, or unspecified chronic kidney disease: Secondary | ICD-10-CM | POA: Diagnosis present

## 2014-12-22 DIAGNOSIS — Z955 Presence of coronary angioplasty implant and graft: Secondary | ICD-10-CM | POA: Diagnosis not present

## 2014-12-22 DIAGNOSIS — G8929 Other chronic pain: Secondary | ICD-10-CM | POA: Diagnosis present

## 2014-12-22 DIAGNOSIS — Z86718 Personal history of other venous thrombosis and embolism: Secondary | ICD-10-CM | POA: Diagnosis not present

## 2014-12-22 DIAGNOSIS — Z8673 Personal history of transient ischemic attack (TIA), and cerebral infarction without residual deficits: Secondary | ICD-10-CM | POA: Diagnosis not present

## 2014-12-22 DIAGNOSIS — N400 Enlarged prostate without lower urinary tract symptoms: Secondary | ICD-10-CM | POA: Diagnosis present

## 2014-12-22 DIAGNOSIS — F329 Major depressive disorder, single episode, unspecified: Secondary | ICD-10-CM | POA: Diagnosis present

## 2014-12-22 DIAGNOSIS — I10 Essential (primary) hypertension: Secondary | ICD-10-CM | POA: Diagnosis not present

## 2014-12-22 DIAGNOSIS — I2510 Atherosclerotic heart disease of native coronary artery without angina pectoris: Secondary | ICD-10-CM | POA: Diagnosis present

## 2014-12-22 DIAGNOSIS — M545 Low back pain: Secondary | ICD-10-CM | POA: Diagnosis present

## 2014-12-22 DIAGNOSIS — M109 Gout, unspecified: Secondary | ICD-10-CM | POA: Diagnosis present

## 2014-12-22 DIAGNOSIS — Z7901 Long term (current) use of anticoagulants: Secondary | ICD-10-CM | POA: Diagnosis not present

## 2014-12-22 DIAGNOSIS — G609 Hereditary and idiopathic neuropathy, unspecified: Secondary | ICD-10-CM | POA: Diagnosis present

## 2014-12-22 DIAGNOSIS — I739 Peripheral vascular disease, unspecified: Secondary | ICD-10-CM | POA: Diagnosis present

## 2014-12-22 DIAGNOSIS — Z9841 Cataract extraction status, right eye: Secondary | ICD-10-CM | POA: Diagnosis not present

## 2014-12-22 DIAGNOSIS — E785 Hyperlipidemia, unspecified: Secondary | ICD-10-CM | POA: Insufficient documentation

## 2014-12-22 DIAGNOSIS — I480 Paroxysmal atrial fibrillation: Secondary | ICD-10-CM

## 2014-12-22 DIAGNOSIS — R471 Dysarthria and anarthria: Secondary | ICD-10-CM | POA: Diagnosis present

## 2014-12-22 DIAGNOSIS — Z8672 Personal history of thrombophlebitis: Secondary | ICD-10-CM | POA: Diagnosis not present

## 2014-12-22 DIAGNOSIS — Z9842 Cataract extraction status, left eye: Secondary | ICD-10-CM | POA: Diagnosis not present

## 2014-12-22 DIAGNOSIS — N179 Acute kidney failure, unspecified: Secondary | ICD-10-CM | POA: Diagnosis present

## 2014-12-22 DIAGNOSIS — F4310 Post-traumatic stress disorder, unspecified: Secondary | ICD-10-CM | POA: Diagnosis present

## 2014-12-22 DIAGNOSIS — H5442 Blindness, left eye, normal vision right eye: Secondary | ICD-10-CM | POA: Diagnosis present

## 2014-12-22 DIAGNOSIS — R4781 Slurred speech: Secondary | ICD-10-CM | POA: Diagnosis present

## 2014-12-22 LAB — LIPID PANEL
CHOLESTEROL: 233 mg/dL — AB (ref 0–200)
HDL: 45 mg/dL (ref >40)
LDL CALC: 159 mg/dL — AB (ref 0–99)
Total CHOL/HDL Ratio: 5.2 RATIO
Triglycerides: 146 mg/dL (ref <150)
VLDL: 29 mg/dL (ref 0–40)

## 2014-12-22 LAB — BASIC METABOLIC PANEL
ANION GAP: 10 (ref 5–15)
BUN: 40 mg/dL — AB (ref 6–20)
CALCIUM: 8.7 mg/dL — AB (ref 8.9–10.3)
CO2: 28 mmol/L (ref 22–32)
CREATININE: 1.92 mg/dL — AB (ref 0.61–1.24)
Chloride: 100 mmol/L — ABNORMAL LOW (ref 101–111)
GFR calc Af Amer: 33 mL/min — ABNORMAL LOW (ref >60)
GFR, EST NON AFRICAN AMERICAN: 28 mL/min — AB (ref >60)
GLUCOSE: 85 mg/dL (ref 65–99)
Potassium: 4 mmol/L (ref 3.5–5.1)
Sodium: 138 mmol/L (ref 135–145)

## 2014-12-22 LAB — GLUCOSE, CAPILLARY
GLUCOSE-CAPILLARY: 94 mg/dL (ref 65–99)
Glucose-Capillary: 100 mg/dL — ABNORMAL HIGH (ref 65–99)
Glucose-Capillary: 119 mg/dL — ABNORMAL HIGH (ref 65–99)
Glucose-Capillary: 103 mg/dL — ABNORMAL HIGH (ref 65–99)

## 2014-12-22 LAB — MRSA PCR SCREENING: MRSA BY PCR: POSITIVE — AB

## 2014-12-22 MED ORDER — ASPIRIN EC 81 MG PO TBEC
81.0000 mg | DELAYED_RELEASE_TABLET | Freq: Every day | ORAL | Status: DC
  Administered 2014-12-22 – 2014-12-23 (×2): 81 mg via ORAL
  Filled 2014-12-22 (×2): qty 1

## 2014-12-22 MED ORDER — LATANOPROST 0.005 % OP SOLN
1.0000 [drp] | Freq: Every day | OPHTHALMIC | Status: DC
  Administered 2014-12-22 (×2): 1 [drp] via OPHTHALMIC
  Filled 2014-12-22: qty 2.5

## 2014-12-22 MED ORDER — DICLOFENAC SODIUM 1 % TD GEL
2.0000 g | Freq: Four times a day (QID) | TRANSDERMAL | Status: DC
  Administered 2014-12-22 – 2014-12-23 (×6): 2 g via TOPICAL
  Filled 2014-12-22: qty 100

## 2014-12-22 MED ORDER — TAMSULOSIN HCL 0.4 MG PO CAPS
0.4000 mg | ORAL_CAPSULE | Freq: Every day | ORAL | Status: DC
  Administered 2014-12-22 – 2014-12-23 (×2): 0.4 mg via ORAL
  Filled 2014-12-22 (×2): qty 1

## 2014-12-22 MED ORDER — RIVAROXABAN 15 MG PO TABS
15.0000 mg | ORAL_TABLET | Freq: Every day | ORAL | Status: DC
  Administered 2014-12-22 – 2014-12-23 (×2): 15 mg via ORAL
  Filled 2014-12-22 (×2): qty 1

## 2014-12-22 MED ORDER — ACETAMINOPHEN 650 MG RE SUPP: 650.0000 mg | RECTAL | Status: DC | PRN

## 2014-12-22 MED ORDER — PANTOPRAZOLE SODIUM 40 MG PO TBEC
40.0000 mg | DELAYED_RELEASE_TABLET | Freq: Every day | ORAL | Status: DC
  Administered 2014-12-22 – 2014-12-23 (×2): 40 mg via ORAL
  Filled 2014-12-22 (×2): qty 1

## 2014-12-22 MED ORDER — SODIUM CHLORIDE 0.9 % IV SOLN
INTRAVENOUS | Status: AC
  Administered 2014-12-22: 12:00:00 via INTRAVENOUS

## 2014-12-22 MED ORDER — MIRABEGRON ER 25 MG PO TB24
25.0000 mg | ORAL_TABLET | Freq: Every day | ORAL | Status: DC
  Administered 2014-12-22 – 2014-12-23 (×2): 25 mg via ORAL
  Filled 2014-12-22 (×2): qty 1

## 2014-12-22 MED ORDER — MUPIROCIN 2 % EX OINT
1.0000 "application " | TOPICAL_OINTMENT | Freq: Two times a day (BID) | CUTANEOUS | Status: DC
  Administered 2014-12-22 – 2014-12-23 (×3): 1 via NASAL

## 2014-12-22 MED ORDER — LOSARTAN POTASSIUM 50 MG PO TABS: 25.0000 mg | ORAL_TABLET | Freq: Every day | ORAL | Status: DC

## 2014-12-22 MED ORDER — MUPIROCIN 2 % EX OINT
TOPICAL_OINTMENT | Freq: Two times a day (BID) | CUTANEOUS | Status: DC
  Administered 2014-12-22: 09:00:00 via NASAL
  Filled 2014-12-22: qty 22

## 2014-12-22 MED ORDER — DULOXETINE HCL 30 MG PO CPEP
30.0000 mg | ORAL_CAPSULE | Freq: Every day | ORAL | Status: DC
  Administered 2014-12-22 – 2014-12-23 (×2): 30 mg via ORAL
  Filled 2014-12-22 (×2): qty 1

## 2014-12-22 MED ORDER — SENNOSIDES-DOCUSATE SODIUM 8.6-50 MG PO TABS: 1.0000 | ORAL_TABLET | Freq: Every evening | ORAL | Status: DC | PRN

## 2014-12-22 MED ORDER — CHLORHEXIDINE GLUCONATE CLOTH 2 % EX PADS
6.0000 | MEDICATED_PAD | Freq: Every day | CUTANEOUS | Status: DC
  Administered 2014-12-22: 6 via TOPICAL

## 2014-12-22 MED ORDER — ACETAMINOPHEN 325 MG PO TABS
650.0000 mg | ORAL_TABLET | ORAL | Status: DC | PRN
  Administered 2014-12-22: 650 mg via ORAL
  Filled 2014-12-22: qty 2

## 2014-12-22 MED ORDER — SIMVASTATIN 40 MG PO TABS
40.0000 mg | ORAL_TABLET | Freq: Every day | ORAL | Status: DC
  Administered 2014-12-22: 40 mg via ORAL
  Filled 2014-12-22: qty 1

## 2014-12-22 MED ORDER — PREDNISONE 5 MG PO TABS
10.0000 mg | ORAL_TABLET | Freq: Every day | ORAL | Status: DC
  Administered 2014-12-22 – 2014-12-23 (×2): 10 mg via ORAL
  Filled 2014-12-22 (×2): qty 2

## 2014-12-22 MED ORDER — SIMVASTATIN 5 MG PO TABS
10.0000 mg | ORAL_TABLET | Freq: Every day | ORAL | Status: DC
Start: 2014-12-22 — End: 2014-12-22
  Filled 2014-12-22: qty 2

## 2014-12-22 MED ORDER — FUROSEMIDE 40 MG PO TABS: 40.0000 mg | ORAL_TABLET | Freq: Every day | ORAL | Status: DC

## 2014-12-22 MED ORDER — STROKE: EARLY STAGES OF RECOVERY BOOK
Freq: Once | Status: AC
  Administered 2014-12-22: 1

## 2014-12-22 NOTE — Clinical Social Work Note (Signed)
Clinical Social Worker has reviewed patient's chart and noted that patient was admitted from ALF, [facility].   CSW to complete psychosocial assessment and await evaluation from PT for pt's level of appropriateness at discharge.   CSW remains available as needed.   Derenda Fennel, MSW, LCSWA [phone] 12/22/2014 3:00 PM

## 2014-12-22 NOTE — Progress Notes (Signed)
Pt arrived from ED. Alert and oriented x4. Denies any pain. Oriented to room with verbalized understanding. Dtr at bedside. Safety measures in place. Call light within reach. Will continue to monitor. Shella Spearing, RN

## 2014-12-22 NOTE — Progress Notes (Signed)
STROKE TEAM PROGRESS NOTE   HISTORY Jason [name] is an 81 y.o. male who lives in an assisted living facility who reports that on the 9th he awakened with slurred speech and feeling a numbness on the lower portion of the left side of his mouth. His family came to see him on the 10th and noted the slurred speech. EMS was called at that time and the patient was brought in for evaluation. NIHSS of 2. Patient is on Xarelto and is compliant. 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 MRankin: 3 He was last known well: 12/19/2014 at 22:30. Patient was not administered TPA secondary to outside time window, On Xarelto. He was admitted for further evaluation and treatment.   SUBJECTIVE (INTERVAL HISTORY) His son is at the bedside.  Overall he feels his condition is stable. Pt refused MRI due to claustrophobia. Still has slurry speech as per son.    OBJECTIVE Temp:  [98 F (36.7 C)-98.3 F (36.8 C)] 98 F (36.7 C) (10/11 1000) Pulse Rate:  [40-84] 76 (10/11 1000) Cardiac Rhythm:  [-] Normal sinus rhythm (10/11 0735) Resp:  [14-22] 16 (10/11 1000) BP: (130-182)/(62-92) 166/80 mmHg (10/11 1000) SpO2:  [89 %-98 %] 91 % (10/11 1000) Weight:  [86.864 kg (191 lb 8 oz)] 86.864 kg (191 lb 8 oz) (10/11 0157)  CBC:   Recent Labs Lab 12/21/14 2003  WBC 8.8  NEUTROABS 6.1  HGB 13.2  HCT 40.1  MCV 97.8  PLT 196    Basic Metabolic Panel:   Recent Labs Lab 12/21/14 2003 12/22/14 0508  NA 136 138  K 4.0 4.0  CL 98* 100*  CO2 28 28  GLUCOSE 87 85  BUN 45* 40*  CREATININE 2.13* 1.92*  CALCIUM 8.5* 8.7*    Lipid Panel:     Component Value Date/Time   CHOL 233* 12/22/2014 0508   TRIG 146 12/22/2014 0508   TRIG 118 02/26/2006 1112   HDL 45 12/22/2014 0508   CHOLHDL 5.2 12/22/2014 0508   CHOLHDL 5.6 CALC 02/26/2006 1112   VLDL 29 12/22/2014 0508   LDLCALC 159* 12/22/2014 0508   HgbA1c:  Lab Results  Component Value Date    HGBA1C 5.5 08/06/2014   Urine Drug Screen: No results found for: LABOPIA, COCAINSCRNUR, LABBENZ, AMPHETMU, THCU, LABBARB    IMAGING I have personally reviewed the radiological images below and agree with the radiology interpretations.  Dg Chest 2 View 12/21/2014   No active cardiopulmonary disease.     Ct Head Wo Contrast 12/21/2014    No evidence of acute intracranial abnormality.  Atrophy, chronic small-vessel white matter ischemic changes and remote infarcts as described.     Mr Brain Wo Contrast 12/21/2014   1. 13 mm acute nonhemorrhagic ischemic infarct within the deep white matter of the right centrum semi ovale. 2. 7 mm acute nonhemorrhagic ischemic infarct within the right cerebellar hemisphere. 3. Encephalomalacia within the bilateral parietal lobes and right frontal lobe, likely related to remote ischemia. Additional remote lacunar infarct within the right caudate head. 4. Generalized age-related cerebral atrophy with chronic microvascular ischemic disease.     CUS - Bilateral: 1-39% ICA stenosis. Right ECA > 50% stenosis  2D echo 11/2014 - Left ventricle: The cavity size was normal. There was mild focal basal hypertrophy of the septum. Systolic function was normal. The estimated ejection fraction was in the range of 50% to 55%. Wall motion was normal; there were no regional wall  motion abnormalities. Doppler parameters are consistent with abnormal left ventricular relaxation (grade 1 diastolic dysfunction). - Aortic valve: Trileaflet; moderately thickened, moderately calcified leaflets. There was trivial regurgitation. - Left atrium: The atrium was moderately dilated. - Pulmonic valve: There was moderate regurgitation. - Pulmonary arteries: Systolic pressure was mildly increased. PA peak pressure: 38 mm Hg (S). - Pericardium, extracardiac: A trivial pericardial effusion was identified posterior to the heart.  LE venous doppler - negative.  PHYSICAL  EXAM  Temp:  [97.7 F (36.5 C)-98.3 F (36.8 C)] 97.8 F (36.6 C) (10/11 1642) Pulse Rate:  [40-84] 83 (10/11 1358) Resp:  [14-22] 20 (10/11 1358) BP: (130-182)/(62-97) 178/82 mmHg (10/11 1358) SpO2:  [89 %-98 %] 94 % (10/11 1358) Weight:  [191 lb 8 oz (86.864 kg)] 191 lb 8 oz (86.864 kg) (10/11 0157)  General - Well nourished, well developed, in no apparent distress.  Ophthalmologic - Fundi not visualized due to noncooperation.  Cardiovascular - irregular heart rhythm, on tele showed sinus arrhythmia, no afib.  Mental Status -  Level of arousal and orientation to time, place, and person were intact. Language including expression, naming, repetition, comprehension was assessed and found intact. Fund of Knowledge was assessed and was intact.  Cranial Nerves II - XII - II - Visual field intact OU. III, IV, VI - Extraocular movements intact. V - Facial sensation intact bilaterally. VII - left nasolabial fold flattening. VIII - Hearing & vestibular intact bilaterally. X - Palate elevates symmetrically, mild dysarthria. XI - Chin turning & shoulder shrug intact bilaterally. XII - Tongue protrusion intact.  Motor Strength - The patient's strength was normal in all extremities and pronator drift was absent.  Bulk was normal and fasciculations were absent.   Motor Tone - Muscle tone was assessed at the neck and appendages and was normal.  Reflexes - The patient's reflexes were 1+ in all extremities and he had no pathological reflexes.  Sensory - Light touch, temperature/pinprick were assessed and were symmetrical.    Coordination - The patient had normal movements in the hands with no ataxia or dysmetria.  Tremor was absent.  Gait and Station - deferred due to safety concerns.   ASSESSMENT/PLAN [name] is a 81 y.o. male with history of PTSD, PVD s/p L CEA in 2016, CVA, CAD s/p PCI in 2007, hx PE and multiple DVT on Xarelto, hiatal hernia, gout, and HTN presenting with  dizziness and slurred speech. He did not receive IV t-PA due to outside time window, on Xarelto.   Stroke:  right centrum semiovale and right cerebellar infarcts, could be embolic secondary to unknown source while on xarelto  Resultant  L facial with mild dysarthria  MRI  right centrum semiovale and right cerebellar infarcts  MRA  Pt refused  Carotid Doppler unremarkable  2D Echo  11/17/2014 EF 50-55%  LE venous dopplers negative  LDL 159  HgbA1c 5.5 in May  xarelto for VTE prophylaxis Diet heart healthy/carb modified Room service appropriate?: Yes; Fluid consistency:: Thin  xarelto ( rivaroxaban) prior to admission, now on xarelto ( rivaroxaban). Pt has been compliant with medication. Add baby ASA for stroke prevention.   Ongoing aggressive stroke risk factor management  Therapy recommendations:  pending   Disposition:  pending   Hypertension  Stable  Permissive hypertension (OK if < 220/120) but gradually normalize in 5-7 days  Hyperlipidemia  Home meds:  zocor 10,  resumed in hospital  LDL 159, goal < 70  Increase zocor to   Continue statin at discharge  Other Stroke Risk Factors  Advanced age  ETOH use  Hx TIA 01/2006 & 05/2006  Coronary artery disease - MI  PVD  Carotid disease s/p L CEA  Hx PE, has IVC  Recurrent DVT on Xarelto  Other Active Problems  Decreased visual acuity OS  Hospital day # 0  Neurology will sign off. Please call with questions. Pt will follow up with Dr. Roda Shutters at [facility] in about 2 months. Thanks for the consult.   Marvel Plan, MD PhD Stroke Neurology 12/22/2014 5:15 PM    To contact Stroke Continuity provider, please refer to [email or web address]. After hours, contact General Neurology

## 2014-12-22 NOTE — H&P (Signed)
History and Physical  Jason [name]  ZOX:[id number]  DOB: [birth year]  DOA: 12/21/2014  Referring physician: Margorie John, MD PCP: Jason Grosser, MD   Chief Complaint: Dizziness and slurred speech  HPI: Jason [name] is a 81 y.o. male with a past medical history significant for PTSD, PVD s/p L CEA in 2016, CVA, CAD s/p PCI in 2007, hx DVT, hiatal hernia, gout, and HTN who presents with worsening vertigo and slurred speech.  The patient was in his usual state of health until 1 day ago when he woke up with worse vertigo than usual.  He felt moderately dizzy all day, without relief.  Today, he noticed that his speech was slurred and his left side of his mouth felt "heavy", so he called his daughter who brought him to the ER.  In the ED, the patient was noted to have slurred speech.  A non-contrasted head CT was unremarkable but an MRI brain showed two new acute infarcts in R cerebellum and R white matter.  TRH was asked to admit for new ischemic stroke.   Review of Systems:  Patient seen 4:51 AM on 12/22/2014. Pt complains of vertigo, slurred speech, facial weakness.  All other systems negative except as just noted or noted in the history of present illness.  Past Medical History  Diagnosis Date  . PVD (peripheral vascular disease) (HCC)   . CAD (coronary artery disease)     a. 12/2005 - PCI to OM  . Hyperlipidemia   . Cerebrovascular accident (HCC)   . DVT femoral (deep venous thrombosis) with thrombophlebitis (HCC) 03/2007    Jason [name] 07/13/2010  . GERD (gastroesophageal reflux disease)   . Edema   . Vertigo   . IBS (irritable bowel syndrome)   . Duodenitis   . Gastritis   . Esophageal stricture   . Cellulitis   . Pulmonary embolism (HCC)   . Prostatic hypertrophy   . Frequent falls   . Subdural hematoma (HCC)   . PTSD (post-traumatic stress disorder)   . Vertigo   . TIA (transient ischemic attack)     a. 01/2006 and 05/2006.   [name] Hiatal hernia     periferal neuropathy  . Hx  of echocardiogram     Echo 9/13:  Mild LVH, EF 60-65%, Gr 1 diast dysfn, mild AI, mild BAE  . Gout   . Chronic low back pain   . Gait disorder   . Peripheral neuropathy (HCC)   . Hydrocele   . History of shingles     Left lumbar  . HOH (hard of hearing)     hearing aid, totally in R ear ( no hearing aid in L )   . Myocardial infarction Jason [name]) "years ago"    "dr said I'd had a small one"  . Daily headache   . Depression   . Urinary frequency   . Blind left eye   . Hypertension   . Dysrhythmia   . Arthritis   . Anxiety     PTSD  The above past medical history was reviewed.  Past Surgical History  Procedure Laterality Date  . Hand surgery Right     "replaced bone in my finger"  . Angioplasty    . Carotid endarterectomy Left   . Coronary angioplasty with stent placement      left circumflex  . Cataract extraction, bilateral    . Tonsillectomy    . Tympanic membrane repair Right 1978    Jason [name] 07/27/2010; "couldn'getting so t  couldn't hear; had noise in my ear; didn't correct either  . Hand contracture release Right     palm/notes 07/27/2010  . Esophagogastroduodenoscopy (egd) with esophageal dilation    . Insertion of vena cava filter  2013    h/o PE  . Endarterectomy Left 11/02/2014    Procedure: LEFT  CAROTID ARTERY ENDARTERECTOMY  WITH DACRON PATCH ANGIOPLASTY;  Surgeon: Jason Kerns, MD;  Location: [facility] OR;  Service: Vascular;  Laterality: Left;  The above surgical history was reviewed.  Social History: Patient lives in ALF.  He gets help with bathing, and walks with a walker, but is otherwise independent with all ADLs.  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    Allergies  Allergen Reactions  . Ativan [Lorazepam] Other (See Comments)    unknown  . Augmentin [Amoxicillin-Pot Clavulanate] Nausea And Vomiting    daughter states he can tolerate Amoxicillin ok Profuse vomiting  . Fludrocortisone Acetate Other (See Comments)    "leg swelling"  . Scopace  [Scopolamine] Other (See Comments)    *patch "Made him crazy" per patients daughter     Family History  Problem Relation Age of Onset  . Colon cancer Neg Hx   . Coronary artery disease Brother   . Heart attack Father   . Migraines Mother   . Breast cancer Sister   . Dementia Sister   . Renal Disease Brother     Prior to Admission medications   Medication Sig Start Date End Date Taking? Authorizing Provider  acetaminophen (TYLENOL) 500 MG tablet Take 1,000 mg by mouth 2 (two) times daily. Take every day per [name]   Yes Historical Provider, MD  ALPRAZolam (XANAX) 0.25 MG tablet Take 1 tablet (0.25 mg total) by mouth 2 (two) times daily as needed for anxiety. 11/19/14  Yes Jason Shipper, MD  alum & mag hydroxide-simeth (MAALOX/MYLANTA) 200-200-20 MG/5ML suspension Take 30 mLs by mouth every 6 (six) hours as needed for indigestion or heartburn.   Yes Historical Provider, MD  DEXILANT 60 MG capsule TAKE ONE CAPSULE BY MOUTH EVERY DAY 03/16/14  Yes Jason Brooks, MD  diclofenac sodium (VOLTAREN) 1 % GEL Apply 2 g topically 4 (four) times daily. 06/22/14  Yes Jason Brooks, MD  docusate sodium (COLACE) 100 MG capsule Take 1 capsule (100 mg total) by mouth at bedtime. 07/14/14  Yes Jason Brooks, MD  DULoxetine (CYMBALTA) 30 MG capsule TAKE ONE CAPSULE BY MOUTH EVERY DAY 09/07/14  Yes Jason Spaniel, MD  furosemide (LASIX) 40 MG tablet Take 40 mg by mouth daily.   Yes Historical Provider, MD  HYDROcodone-acetaminophen (NORCO) 5-325 MG per tablet Take 1 tablet by mouth every 6 (six) hours as needed for moderate pain or severe pain. 11/19/14  Yes Jason Shipper, MD  latanoprost (XALATAN) 0.005 % ophthalmic solution Place 1 drop into both eyes at bedtime.   Yes Historical Provider, MD  loperamide (IMODIUM) 2 MG capsule Take 2 mg by mouth as needed for diarrhea or loose stools (Not to exceed 8 doses in 24 hours).   Yes Historical Provider, MD  losartan (COZAAR) 25 MG tablet Take 25 mg by mouth  daily.   Yes Historical Provider, MD  magnesium hydroxide (MILK OF MAGNESIA) 400 MG/5ML suspension Take 30 mLs by mouth at bedtime as needed for mild constipation.   Yes Historical Provider, MD  meclizine (ANTIVERT) 12.5 MG tablet Take 12.5 mg by mouth every 12 (twelve) hours as needed for dizziness.  Yes Historical Provider, MD  mupirocin ointment (BACTROBAN) 2 % 1 Application, nasally, 2 times daily for 5 days prior to surgery 10/30/14  Yes Jason Kerns, MD  MYRBETRIQ 25 MG TB24 tablet TAKE 1 TABLET BY MOUTH EVERY DAY 07/13/14  Yes Jason Brooks, MD  nitroGLYCERIN (NITROSTAT) 0.4 MG SL tablet Place 0.4 mg under the tongue every 5 (five) minutes as needed. Chest pain   Yes Historical Provider, MD  ondansetron (ZOFRAN) 4 MG tablet Take 4 mg by mouth every 8 (eight) hours as needed for nausea.   Yes Historical Provider, MD  polyethylene glycol powder (GLYCOLAX/MIRALAX) powder MIX 17 GRAMS WITH 4 TO 6 OUNCES OF WATER AND DRINK ONCE DAILY AS NEEDED FOR CONSTIPATION 03/07/13  Yes Jason Brooks, MD  polyvinyl alcohol (LIQUIFILM TEARS) 1.4 % ophthalmic solution Place 2 drops into both eyes 3 (three) times daily.   Yes Historical Provider, MD  predniSONE (DELTASONE) 10 MG tablet Take 1 tablet (10 mg total) by mouth daily with breakfast. 09/22/14  Yes Jason Brooks, MD  simvastatin (ZOCOR) 10 MG tablet Take 1 tablet (10 mg total) by mouth daily at 6 PM. 11/03/14  Yes Raymond Gurney, PA-C  tamsulosin (FLOMAX) 0.4 MG CAPS capsule Take 0.4 mg by mouth daily.   Yes Historical Provider, MD  XARELTO 15 MG TABS tablet TAKE 1 TABLET BY MOUTH EVERY DAY WITH SUPPER 10/06/14  Yes Tonny Bollman, MD    Physical Exam: BP 182/71 mmHg  Pulse 73  Temp(Src) 98.3 F (36.8 C) (Oral)  Resp 18  Ht 6' (1.829 m)  Wt 86.864 kg (191 lb 8 oz)  BMI 25.97 kg/m2  SpO2 95% General appearance: Elderly male, alert and in no acute distress.  Responds to questions.  Hard of hearing. Eyes: Sclerae normal without icterus,  conjunctiva pink, lids and lashes normal.  PERRL and EOMI.   Nose: No deformity, discharge, or epistaxis.   Mouth: OP moist without erythema, exudates, cobblestoning, or ulcers.  No airway deformities.   Lymph: No cervical, supraclavicular or axillary lymphadenopathy. Skin: Warm and dry.  No jaundice.  No suspicious rashes or lesions. Cardiac: RRR occasional premature beats, nl S1-S2, no murmurs appreciated.  Capillary refill is less than 2 seconds.  JVP normal.  No LE edema.  Radial and DP pulses 2+ and symmetric.  Prominent right carotid bruit. Respiratory: Normal respiratory rate and rhythm.  CTAB without rales or wheezes. Abdomen: BS present.  Abdomen soft without rigidity.  No TTP or rebound all quadrants. No ascites, distension.   Neuro: Pupils are 4 mm and reactive to 3 mm. Extraocular movements are intact, without nystagmus. Cranial nerve 5 seems weak in left lower lip.  Cranial nerves 9 and 10 reveal equal palate elevation. Cranial nerve 11 reveals sternocleidomastoid strong. Cranial nerve 12 is midline. I do not note a deficit in motor strength testing in the upper and lower extremities bilaterally with normal motor, tone and bulk. Finger-to-nose testing is within normal limits. The patient is oriented to time, place and person. Speech seems somewhat slurred to me. Naming is grossly intact. Recall, recent and remote, as well as general fund of knowledge seem within normal limits. Attention span and concentration are within normal limits. Psych: Appropriate affect.  Speech normal. Thought content/process linear/appropriate.  No evidence of aural or visual hallucinations or delusions.       Labs on Admission:  The metabolic panel is notable for elevated creatinine.  Normal albumin. Normal LFTs. The complete blood count is  notable for normal WBC and no anemia. Troponin negative. UA clear.  INR 1.65   Radiological Exams on Admission: Personally reviewed: Dg Chest 2  View 12/21/2014    No acute disease    Ct Head Wo Contrast 12/21/2014  NAICP     Mr Brain Wo Contrast 12/21/2014  IMPRESSION:  1. 13 mm acute nonhemorrhagic ischemic infarct within the deep white matter of the right centrum semi ovale.  2. 7 mm acute nonhemorrhagic ischemic infarct within the right cerebellar hemisphere.  3. Encephalomalacia within the bilateral parietal lobes and right frontal lobe, likely related to remote ischemia. Additional remote lacunar infarct within the right caudate head.  4. Generalized age-related cerebral atrophy with chronic microvascular ischemic disease.        EKG: Independently reviewed. Sinus rhythm with RBBB.    Assessment/Plan 1. Acute Stroke/TIA:  This is new.  The patient recently had CEA in August after having a carotid US to evaluate for dizziness.  An MRA at that time was done and showed diffuse cerebrovascular disease.  An echocardiogram was unremarkable. -Admit to telemetry -Neuro checks, NIHSS per protocol -Repeat carotid US -PT/OT/SLP -Consult to Neurology, appreciate recommendations -Lipids, hemoglobin A1c  2. Acute on chronic kidney injury:  This is new.  Suspect pre-renal injury given BUN-creatinine ratio. -Urine electrolytes -Hold furosemide and ARB -Repeat BMP  3. Arthritis:  Stable.  -Diclofenac topical and prednisone (which, per chart review, is given for chronic shoulder pain)  4. Hypertension:  Stable.  -Hold ARB and furosemide given elevated creatinine  5. PTSD: Stable. -Continue duloxetine  6. History of DVT: Stable. -Continue rivaroxaban    DVT PPx: Xarelto Diet: Regular after swallow screen Consultants: Neurology Code Status: Full Family Communication: The diagnosis and expected plan of care were dsicussed with the family at the bedside.  All questions were answered.  Code status was discussed.   Disposition Plan:  At the time of admission, it appears that the appropriate admission status  for this patient is INPATIENT. This is judged to be reasonable and necessary in order to provide the required intensity of service to ensure the patient's safety given the presenting symptoms, physical exam findings, and initial radiographic and laboratory data in the context of their chronic comorbidities.  Together, these circumstances are felt to place her/him at high risk for further clinical deterioration threatening life, limb, or organ. The following factors support the admission status of inpatient:   A. The patient's presenting symptoms include slurred speech and facial weakness. B. The worrisome physical exam findings include facial weaknes and numbness C. The initial radiographic and laboratory data are worrisome because of MRI findings of new acute stroke.  AKI.   D. The chronic co-morbidities include history of DVT, advanced age, hypertension, CKD stage III. E. Patient requires inpatient status due to high intensity of service, high risk for further deterioration and high frequency of surveillance required. F. I certify that at the point of admission it is my clinical judgment that the patient will require inpatient hospital care spanning beyond 2 midnights from the point of admission.    Jason [name] [facility] Pager [phone]

## 2014-12-22 NOTE — Consult Note (Signed)
Referring Physician: Clydene Pugh    Chief Complaint: Slurred speech, left face numbness  HPI: Jason [name] is an 81 y.o. male who lives in an assisted living facility who reports that on the 9th he awakened with slurred speech and feeling a numbness on the lower portion of the left side of his mouth.  His family came to see him on the 10th and noted the slurred speech.  EMS was called at that time and the patient was brought in for evaluation.  NIHSS of 2.   Patient is on Xarelto and is compliant.   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    Date last known well: Date: 12/19/2014 Time last known well: Time: 22:30 tPA Given: No: Outside time window, On Xarelto  MRankin: 3   Past Medical History  Diagnosis Date  . PVD (peripheral vascular disease) (HCC)   . CAD (coronary artery disease)     a. 12/2005 - PCI to OM  . Hyperlipidemia   . Cerebrovascular accident (HCC)   . 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 GERD (gastroesophageal reflux disease)   . Edema   . Vertigo   . IBS (irritable bowel syndrome)   . Duodenitis   . Gastritis   . Esophageal stricture   . Cellulitis   . Pulmonary embolism (HCC)   . Prostatic hypertrophy   . Frequent falls   . Subdural hematoma (HCC)   . PTSD (post-traumatic stress disorder)   . Vertigo   . TIA (transient ischemic attack)     a. 01/2006 and 05/2006.   [name] Hiatal hernia     periferal neuropathy  . Hx of echocardiogram     Echo 9/13:  Mild LVH, EF 60-65%, Gr 1 diast dysfn, mild AI, mild BAE  . Gout   . Chronic low back pain   . Gait disorder   . Peripheral neuropathy (HCC)   . Hydrocele   . History of shingles     Left lumbar  . HOH (hard of hearing)     hearing aid, totally in R ear ( no hearing aid in L )   . Myocardial infarction [facility]) "years ago"    "dr said I'd had a small one"  . Daily headache   . Depression   . Urinary  frequency   . Blind left eye   . Hypertension   . Dysrhythmia   . Arthritis   . Anxiety     PTSD    Past Surgical History  Procedure Laterality Date  . Hand surgery Right     "replaced bone in my finger"  . Angioplasty    . Carotid endarterectomy Left   . Coronary angioplasty with stent placement      left circumflex  . Cataract extraction, bilateral    . Tonsillectomy    . Tympanic membrane repair Right 1978    [name] 07/27/2010; "couldn'getting so t couldn't hear; had noise in my ear; didn't correct either  . Hand contracture release Right     palm/notes 07/27/2010  . Esophagogastroduodenoscopy (egd) with esophageal dilation    . Insertion of vena cava filter  2013    h/o PE  . Endarterectomy Left 11/02/2014    Procedure: LEFT  CAROTID ARTERY ENDARTERECTOMY  WITH DACRON PATCH ANGIOPLASTY;  Surgeon: Sherren Kerns, MD;  Location: [facility] OR;  Service: Vascular;  Laterality: Left;  Family History  Problem Relation Age of Onset  . Colon cancer Neg Hx   . Coronary artery disease Brother   . Heart attack Father   . Migraines Mother   . Breast cancer Sister   . Dementia Sister   . Renal Disease Brother    Social History:  reports that he has never smoked. He has never used smokeless tobacco. He reports that he drinks alcohol. He reports that he does not use illicit drugs.  Allergies:  Allergies  Allergen Reactions  . Ativan [Lorazepam] Other (See Comments)    unknown  . Augmentin [Amoxicillin-Pot Clavulanate] Nausea And Vomiting    daughter states he can tolerate Amoxicillin ok Profuse vomiting  . Fludrocortisone Acetate Other (See Comments)    "leg swelling"  . Scopace [Scopolamine] Other (See Comments)    *patch "Made him crazy" per patients daughter     Medications: I have reviewed the patient's current medications. Prior to Admission:  Prior to Admission medications   Medication Sig Start Date End Date Taking? Authorizing Provider  acetaminophen (TYLENOL) 500  MG tablet Take 1,000 mg by mouth 2 (two) times daily. Take every day per [facility]   Yes Historical Provider, MD  ALPRAZolam (XANAX) 0.25 MG tablet Take 1 tablet (0.25 mg total) by mouth 2 (two) times daily as needed for anxiety. 11/19/14  Yes Osvaldo Shipper, MD  alum & mag hydroxide-simeth (MAALOX/MYLANTA) 200-200-20 MG/5ML suspension Take 30 mLs by mouth every 6 (six) hours as needed for indigestion or heartburn.   Yes Historical Provider, MD  DEXILANT 60 MG capsule TAKE ONE CAPSULE BY MOUTH EVERY DAY 03/16/14  Yes Donita Brooks, MD  diclofenac sodium (VOLTAREN) 1 % GEL Apply 2 g topically 4 (four) times daily. 06/22/14  Yes Donita Brooks, MD  docusate sodium (COLACE) 100 MG capsule Take 1 capsule (100 mg total) by mouth at bedtime. 07/14/14  Yes Donita Brooks, MD  DULoxetine (CYMBALTA) 30 MG capsule TAKE ONE CAPSULE BY MOUTH EVERY DAY 09/07/14  Yes York Spaniel, MD  furosemide (LASIX) 40 MG tablet Take 40 mg by mouth daily.   Yes Historical Provider, MD  HYDROcodone-acetaminophen (NORCO) 5-325 MG per tablet Take 1 tablet by mouth every 6 (six) hours as needed for moderate pain or severe pain. 11/19/14  Yes Osvaldo Shipper, MD  latanoprost (XALATAN) 0.005 % ophthalmic solution Place 1 drop into both eyes at bedtime.   Yes Historical Provider, MD  loperamide (IMODIUM) 2 MG capsule Take 2 mg by mouth as needed for diarrhea or loose stools (Not to exceed 8 doses in 24 hours).   Yes Historical Provider, MD  losartan (COZAAR) 25 MG tablet Take 25 mg by mouth daily.   Yes Historical Provider, MD  magnesium hydroxide (MILK OF MAGNESIA) 400 MG/5ML suspension Take 30 mLs by mouth at bedtime as needed for mild constipation.   Yes Historical Provider, MD  meclizine (ANTIVERT) 12.5 MG tablet Take 12.5 mg by mouth every 12 (twelve) hours as needed for dizziness.   Yes Historical Provider, MD  mupirocin ointment (BACTROBAN) 2 % 1 Application, nasally, 2 times daily for 5 days prior to surgery 10/30/14  Yes Sherren Kerns, MD  MYRBETRIQ 25 MG TB24 tablet TAKE 1 TABLET BY MOUTH EVERY DAY 07/13/14  Yes Donita Brooks, MD  nitroGLYCERIN (NITROSTAT) 0.4 MG SL tablet Place 0.4 mg under the tongue every 5 (five) minutes as needed. Chest pain   Yes Historical Provider, MD  ondansetron (ZOFRAN) 4 MG tablet  Take 4 mg by mouth every 8 (eight) hours as needed for nausea.   Yes Historical Provider, MD  polyethylene glycol powder (GLYCOLAX/MIRALAX) powder MIX 17 GRAMS WITH 4 TO 6 OUNCES OF WATER AND DRINK ONCE DAILY AS NEEDED FOR CONSTIPATION 03/07/13  Yes Donita Brooks, MD  polyvinyl alcohol (LIQUIFILM TEARS) 1.4 % ophthalmic solution Place 2 drops into both eyes 3 (three) times daily.   Yes Historical Provider, MD  predniSONE (DELTASONE) 10 MG tablet Take 1 tablet (10 mg total) by mouth daily with breakfast. 09/22/14  Yes Donita Brooks, MD  simvastatin (ZOCOR) 10 MG tablet Take 1 tablet (10 mg total) by mouth daily at 6 PM. 11/03/14  Yes Raymond Gurney, PA-C  tamsulosin (FLOMAX) 0.4 MG CAPS capsule Take 0.4 mg by mouth daily.   Yes Historical Provider, MD  XARELTO 15 MG TABS tablet TAKE 1 TABLET BY MOUTH EVERY DAY WITH SUPPER 10/06/14  Yes Tonny Bollman, MD    ROS: History obtained from the patient and daughter  General ROS: negative for - chills, fatigue, fever, night sweats, weight gain or weight loss Psychological ROS: negative for - behavioral disorder, hallucinations, memory difficulties, mood swings or suicidal ideation Ophthalmic ROS: negative for - blurry vision, double vision, eye pain or loss of vision ENT ROS: episodic vertigo Allergy and Immunology ROS: negative for - hives or itchy/watery eyes Hematological and Lymphatic ROS: negative for - bleeding problems, bruising or swollen lymph nodes Endocrine ROS: negative for - galactorrhea, hair pattern changes, polydipsia/polyuria or temperature intolerance Respiratory ROS: negative for - cough, hemoptysis, shortness of breath or  wheezing Cardiovascular ROS: lower extremity edema Gastrointestinal ROS: negative for - abdominal pain, diarrhea, hematemesis, nausea/vomiting or stool incontinence Genito-Urinary ROS: negative for - dysuria, hematuria, incontinence or urinary frequency/urgency Musculoskeletal ROS: negative for - joint swelling or muscular weakness Neurological ROS: as noted in HPI Dermatological ROS: negative for rash and skin lesion changes  Physical Examination: Blood pressure 165/68, pulse 61, temperature 98.3 F (36.8 C), resp. rate 16, SpO2 98 %.  HEENT-  Normocephalic, no lesions, without obvious abnormality.  Normal external eye and conjunctiva.  Normal TM's bilaterally.  Normal auditory canals and external ears. Normal external nose, mucus membranes and septum.  Normal pharynx. Cardiovascular- S1, S2 normal, pulses palpable throughout   Lungs- chest clear, no wheezing, rales, normal symmetric air entry Abdomen- soft, non-tender; bowel sounds normal; no masses,  no organomegaly Extremities- minimal pedal edema Lymph-no adenopathy palpable Musculoskeletal-no joint tenderness, deformity or swelling Skin-skin changes on the shins bilaterally  Neurological Examination Mental Status: Alert, oriented, thought content appropriate.  Speech fluent without evidence of aphasia.  Able to follow 3 step commands without difficulty. Cranial Nerves: II: Discs flat bilaterally; Visual fields grossly normal, pupils equal, round, reactive to light and accommodation III,IV, VI: ptosis not present, extra-ocular motions intact bilaterally V,VII: decrease in right NLF, facial light touch sensation decreased on the lower portion of the left lip VIII: hearing decreased on the right IX,X: gag reflex present XI: bilateral shoulder shrug XII: midline tongue extension Motor: Right : Upper extremity   5/5    Left:     Upper extremity   5/5  Lower extremity   5/5     Lower extremity   5/5 Tone and bulk:normal tone  throughout; no atrophy noted Sensory: Pinprick and light touch intact throughout, bilaterally Deep Tendon Reflexes: 2+ in the upper extremities and absent in the lower extremities Plantars: Right: mute   Left: mute Cerebellar: normal finger-to-nose  and normal heel-to-shin testing bilaterally Gait: not tested due to safety concerns      Laboratory Studies:  Basic Metabolic Panel:  Recent Labs Lab 12/21/14 2003  NA 136  K 4.0  CL 98*  CO2 28  GLUCOSE 87  BUN 45*  CREATININE 2.13*  CALCIUM 8.5*    Liver Function Tests:  Recent Labs Lab 12/21/14 2003  AST 19  ALT 13*  ALKPHOS 55  BILITOT 0.5  PROT 6.5  ALBUMIN 3.4*   No results for input(s): LIPASE, AMYLASE in the last 168 hours. No results for input(s): AMMONIA in the last 168 hours.  CBC:  Recent Labs Lab 12/21/14 2003  WBC 8.8  NEUTROABS 6.1  HGB 13.2  HCT 40.1  MCV 97.8  PLT 196    Cardiac Enzymes: No results for input(s): CKTOTAL, CKMB, CKMBINDEX, TROPONINI in the last 168 hours.  BNP: Invalid input(s): POCBNP  CBG: No results for input(s): GLUCAP in the last 168 hours.  Microbiology: Results for orders placed or performed during the hospital encounter of 10/29/14  Surgical pcr screen     Status: Abnormal   Collection Time: 10/29/14  2:24 PM  Result Value Ref Range Status   MRSA, PCR POSITIVE (A) NEGATIVE Final   Staphylococcus aureus POSITIVE (A) NEGATIVE Final    Comment:        The Xpert SA Assay (FDA approved for NASAL specimens in patients over 9 years of age), is one component of a comprehensive surveillance program.  Test performance has been validated by [facility] for patients greater than or equal to 30 year old. It is not intended to diagnose infection nor to guide or monitor treatment.     Coagulation Studies:  Recent Labs  12/21/14 2003  LABPROT 19.5*  INR 1.65*    Urinalysis:  Recent Labs Lab 12/21/14 1940  COLORURINE YELLOW  LABSPEC 1.010  PHURINE  5.5  GLUCOSEU NEGATIVE  HGBUR NEGATIVE  BILIRUBINUR NEGATIVE  KETONESUR NEGATIVE  PROTEINUR NEGATIVE  UROBILINOGEN 0.2  NITRITE NEGATIVE  LEUKOCYTESUR NEGATIVE    Lipid Panel:    Component Value Date/Time   CHOL 211* 01/18/2007 0846   TRIG 81 01/18/2007 0846   TRIG 118 02/26/2006 1112   HDL 37.0* 01/18/2007 0846   CHOLHDL 5.7 CALC 01/18/2007 0846   CHOLHDL 5.6 CALC 02/26/2006 1112   VLDL 16 01/18/2007 0846    XBMW4X:  Lab Results  Component Value Date   HGBA1C 5.5 08/06/2014    Urine Drug Screen:  No results found for: LABOPIA, COCAINSCRNUR, LABBENZ, AMPHETMU, THCU, LABBARB  Alcohol Level: No results for input(s): ETH in the last 168 hours.  Other results: EKG: sinus rhythm at 62 bpm.  Imaging: Dg Chest 2 View  12/21/2014   CLINICAL DATA:  Slurred speech and weakness tonight.  EXAM: CHEST  2 VIEW  COMPARISON:  11/16/2014  FINDINGS: Normal heart size and pulmonary vascularity. No focal airspace disease or consolidation in the lungs. No blunting of costophrenic angles. No pneumothorax. Mediastinal contours appear intact. Degenerative changes in the spine and shoulders. Calcification of the aorta.  IMPRESSION: No active cardiopulmonary disease.   Electronically Signed   By: Burman Nieves M.D.   On: 12/21/2014 22:39   Ct Head Wo Contrast  12/21/2014   CLINICAL DATA:  81 year old male with acute weakness and slurred speech for 1 day.  EXAM: CT HEAD WITHOUT CONTRAST  TECHNIQUE: Contiguous axial images were obtained from the base of the skull through the vertex without intravenous contrast.  COMPARISON:  05/22/2014 and prior exams  FINDINGS: Atrophy, chronic small-vessel white matter ischemic changes again noted. Remote right caudate and high bilateral frontoparietal infarcts again identified.  No acute intracranial abnormalities are identified, including mass lesion or mass effect, hydrocephalus, extra-axial fluid collection, midline shift, hemorrhage, or acute infarction.  The  visualized bony calvarium is unremarkable.  IMPRESSION: No evidence of acute intracranial abnormality.  Atrophy, chronic small-vessel white matter ischemic changes and remote infarcts as described.   Electronically Signed   By: Harmon Pier M.D.   On: 12/21/2014 19:58   Mr Brain Wo Contrast  12/21/2014   CLINICAL DATA:  Initial evaluation for acute slurred speech, left-sided facial droop.  EXAM: MRI HEAD WITHOUT CONTRAST  TECHNIQUE: Multiplanar, multiecho pulse sequences of the brain and surrounding structures were obtained without intravenous contrast.  COMPARISON:  Prior CT from earlier the same day.  FINDINGS: Patchy area of restricted diffusion within the deep white matter of the right corona radiata measures 13 mm. Corresponding signal loss seen on ADC map. Additional 7 mm area of restricted diffusion within the left cerebellar hemisphere, also consistent with acute ischemic infarct (series 5, image 10). No associated hemorrhage or mass effect. No other infarct. Normal intravascular flow voids maintained.  Diffuse prominence of the CSF containing spaces is compatible with generalized age-related cerebral atrophy. There is encephalomalacia with gliosis within the high parietal regions bilaterally, likely related to remote ischemia. Small amount of chronic hemosiderin staining within the posterior right frontal parietal region. Small remote lacunar infarct within the right caudate head. Small remote cortical infarct within the right frontal lobe present as well.  No mass lesion, midline shift, or mass effect. No hydrocephalus. No extra-axial fluid collection.  Craniocervical junction within normal limits. Pituitary gland normal.  No acute abnormality about the orbits. Sequela prior bilateral lens extraction noted.  Mild mucosal thickening within the ethmoidal air cells. Paranasal sinuses are otherwise clear. Scattered opacity within the inferior right mastoid air cells.  Bone marrow signal intensity within  normal limits. No scalp soft tissue abnormality.  IMPRESSION: 1. 13 mm acute nonhemorrhagic ischemic infarct within the deep white matter of the right centrum semi ovale. 2. 7 mm acute nonhemorrhagic ischemic infarct within the right cerebellar hemisphere. 3. Encephalomalacia within the bilateral parietal lobes and right frontal lobe, likely related to remote ischemia. Additional remote lacunar infarct within the right caudate head. 4. Generalized age-related cerebral atrophy with chronic microvascular ischemic disease.   Electronically Signed   By: Rise Mu M.D.   On: 12/21/2014 23:58    Assessment: 81 y.o. male with a history of DVT on Xarelto who presents with a greater than 24 hour history of slurred speech and numbness on lower left side of the mouth.  Head CT was unremarkable.  MRI of the brain shows a small right cerebellar and right caudate.  Patient compliant with Xarelto.   Last echocardiogram performed on 9/6 shows no intracardiac masses or thrombi.  Last Dopplers on 6/22 showed high grade LICA stenosis that had progressed in 4 months time.  RICA was at  50-69%.   Further work up recommended.    Stroke Risk Factors - hyperlipidemia and hypertension  Plan: 1. HgbA1c, fasting lipid panel 2. PT consult, OT consult, Speech consult 3. Carotid dopplers 4. Prophylactic therapy-Continue Xarelto 5. NPO until RN stroke swallow screen 6. Telemetry monitoring 7. Frequent neuro checks  Case discussed with Dr. Clydene Pugh and Dr. Cecil Cranker, MD [facility] [phone] 12/22/2014, 1:35 AM

## 2014-12-22 NOTE — Progress Notes (Signed)
PROGRESS NOTE  Jason [name]:[id number] DOB: [birth year] DOA: 12/21/2014 PCP: Leo Grosser, MD   HPI:  Jason [name] is a 81 y.o. male with a OMHx of PTSD, PVD s/p L CEA in 2016, CVA, CAD s/p PCI in 2007, hx DVT, hiatal hernia, gout, and HTN who presented to ED with worsening vertigo and slurred speech. The patient was in his usual state of health until 1 day prior to presentation when he woke up with worsened vertigo than usual. He felt moderately dizzy all day, without relief.Today, he noticed that his speech was slurred and his left side of his mouth felt "heavy", so he called his daughter who brought him to the ER.  In the ED, the patient was noted to have slurred speech. A non-contrasted head CT was unremarkable but an MRI brain showed two new acute infarcts in R cerebellum and R white matter. TRH was asked to admit for new ischemic stroke.  Subjective / 24 H Interval events - Speech is clear and appropriate, patient alert and oriented - Still having general weakness and some dizziness when ambulating to restroom  Assessment/Plan: Principal Problem:   Acute ischemic stroke (HCC) Active Problems:   CKD (chronic kidney disease), stage III   PAF (paroxysmal atrial fibrillation) (HCC)   History of DVT (deep vein thrombosis)   Hereditary and idiopathic peripheral neuropathy   CHF (congestive heart failure) (HCC)   Essential hypertension  Acute ischemic CVA  - neuro deficits improved today - MRI found acute infarct within right centrum semi ovale as well as one in right cerebellar hemisphere - S/p L CEA in August after critical LICA stenosis found on carotid US to evaluate for dizziness. 50-70% R stenosis.An MRA was done at that time, showed diffuse cerebrovascular disease - Echocardiogram 9/6 with EF 50-55% and no embolic foci - Repeat carotid US pending - PT/OT/SLP pending - LDL 159, on 10 mg simvastatin at home. Consider changing to lipitor with goal LDL < 70 - HgA1c  pending - stroke team to see  Acute on chronic kidney injury, CKD III - Suspect pre-renal causes due to lasix and decreased po intake from current symptoms - Cr improved from yesterday, 1.92 today with baseline around 1.3-1.6 - IVF repletion - Continued hold of furosemide and ARB - Continue to monitor BMP  Arthritis  - Diclofenac topical and prednisone (which, per chart review, is given for chronic shoulder pain)  Hypertension - allow permissive HTN  PTSD - Stable - Continue duloxetine  History of DVT - Stable - Continue xarelto - Repeat LE doppler pending   Diet: Diet heart healthy/carb modified Room service appropriate?: Yes; Fluid consistency:: Thin Fluids: NS DVT Prophylaxis: Xarelto  Code Status: Full Code Family Communication: Son at bedside  Disposition Plan: pt to eval Barriers to discharge: Pending CVA workup completion  Consultants:  Neurology  Procedures:  LE Venous US  Carotid Doppler  Antibiotics  None  Studies:  Dg Chest 2 View  12/21/2014   CLINICAL DATA:  Slurred speech and weakness tonight.  EXAM: CHEST  2 VIEW  COMPARISON:  11/16/2014  FINDINGS: Normal heart size and pulmonary vascularity. No focal airspace disease or consolidation in the lungs. No blunting of costophrenic angles. No pneumothorax. Mediastinal contours appear intact. Degenerative changes in the spine and shoulders. Calcification of the aorta.  IMPRESSION: No active cardiopulmonary disease.   Electronically Signed   By: Burman Nieves M.D.   On: 12/21/2014 22:39   Ct Head Wo Contrast  12/21/2014  CLINICAL DATA:  81 year old male with acute weakness and slurred speech for 1 day.  EXAM: CT HEAD WITHOUT CONTRAST  TECHNIQUE: Contiguous axial images were obtained from the base of the skull through the vertex without intravenous contrast.  COMPARISON:  05/22/2014 and prior exams  FINDINGS: Atrophy, chronic small-vessel white matter ischemic changes again noted. Remote right  caudate and high bilateral frontoparietal infarcts again identified.  No acute intracranial abnormalities are identified, including mass lesion or mass effect, hydrocephalus, extra-axial fluid collection, midline shift, hemorrhage, or acute infarction.  The visualized bony calvarium is unremarkable.  IMPRESSION: No evidence of acute intracranial abnormality.  Atrophy, chronic small-vessel white matter ischemic changes and remote infarcts as described.   Electronically Signed   By: Harmon Pier M.D.   On: 12/21/2014 19:58   Mr Brain Wo Contrast  12/21/2014   CLINICAL DATA:  Initial evaluation for acute slurred speech, left-sided facial droop.  EXAM: MRI HEAD WITHOUT CONTRAST  TECHNIQUE: Multiplanar, multiecho pulse sequences of the brain and surrounding structures were obtained without intravenous contrast.  COMPARISON:  Prior CT from earlier the same day.  FINDINGS: Patchy area of restricted diffusion within the deep white matter of the right corona radiata measures 13 mm. Corresponding signal loss seen on ADC map. Additional 7 mm area of restricted diffusion within the left cerebellar hemisphere, also consistent with acute ischemic infarct (series 5, image 10). No associated hemorrhage or mass effect. No other infarct. Normal intravascular flow voids maintained.  Diffuse prominence of the CSF containing spaces is compatible with generalized age-related cerebral atrophy. There is encephalomalacia with gliosis within the high parietal regions bilaterally, likely related to remote ischemia. Small amount of chronic hemosiderin staining within the posterior right frontal parietal region. Small remote lacunar infarct within the right caudate head. Small remote cortical infarct within the right frontal lobe present as well.  No mass lesion, midline shift, or mass effect. No hydrocephalus. No extra-axial fluid collection.  Craniocervical junction within normal limits. Pituitary gland normal.  No acute abnormality about  the orbits. Sequela prior bilateral lens extraction noted.  Mild mucosal thickening within the ethmoidal air cells. Paranasal sinuses are otherwise clear. Scattered opacity within the inferior right mastoid air cells.  Bone marrow signal intensity within normal limits. No scalp soft tissue abnormality.  IMPRESSION: 1. 13 mm acute nonhemorrhagic ischemic infarct within the deep white matter of the right centrum semi ovale. 2. 7 mm acute nonhemorrhagic ischemic infarct within the right cerebellar hemisphere. 3. Encephalomalacia within the bilateral parietal lobes and right frontal lobe, likely related to remote ischemia. Additional remote lacunar infarct within the right caudate head. 4. Generalized age-related cerebral atrophy with chronic microvascular ischemic disease.   Electronically Signed   By: Rise Mu M.D.   On: 12/21/2014 23:58   Objective  Filed Vitals:   12/22/14 0130 12/22/14 0157 12/22/14 0400 12/22/14 0809  BP: 165/68 146/72 182/71 158/71  Pulse: 61 76 73 74  Temp:  98.3 F (36.8 C) 98.3 F (36.8 C) 98.1 F (36.7 C)  TempSrc:  Oral Oral Oral  Resp: Height:  6' (1.829 m)    Weight:  86.864 kg (191 lb 8 oz)    SpO2: 98% 97% 95% 93%    Intake/Output Summary (Last 24 hours) at 12/22/14 0916 Last data filed at 12/22/14 0857  Gross per 24 hour  Intake      0 ml  Output    100 ml  Net   -100 ml  Filed Weights   12/22/14 0157  Weight: 86.864 kg (191 lb 8 oz)   Exam:  GENERAL: NAD, appears stated age  HEENT: head NCAT, no scleral icterus. Mucous membranes are moist.   NECK: Supple. No LAD  LUNGS: Clear to auscultation. No wheezing or crackles  HEART: Regular rate and rhythm. 2+ pulses, no JVD, no peripheral edema  ABDOMEN: Soft, non-distended, non-tender. Positive bowel sounds.  EXTREMITIES: Without any cyanosis or clubbing. Good muscle tone  NEUROLOGIC: Alert and oriented, speech clear and appropriate. Strength 5/5 in all 4.  PSYCHIATRIC:  Normal mood and affect  SKIN: No ulceration, induration, rashes present on limited exam  Data Reviewed: Basic Metabolic Panel:  Recent Labs Lab 12/21/14 2003 12/22/14 0508  NA 136 138  K 4.0 4.0  CL 98* 100*  CO2 28 28  GLUCOSE 87 85  BUN 45* 40*  CREATININE 2.13* 1.92*  CALCIUM 8.5* 8.7*   Liver Function Tests:  Recent Labs Lab 12/21/14 2003  AST 19  ALT 13*  ALKPHOS 55  BILITOT 0.5  PROT 6.5  ALBUMIN 3.4*   CBC:  Recent Labs Lab 12/21/14 2003  WBC 8.8  NEUTROABS 6.1  HGB 13.2  HCT 40.1  MCV 97.8  PLT 196   BNP (last 3 results)  Recent Labs  11/16/14 1942  BNP 598.4*   CBG:  Recent Labs Lab 12/22/14 0649  GLUCAP 100*   Recent Results (from the past 240 hour(s))  MRSA PCR Screening     Status: Abnormal   Collection Time: 12/22/14  3:47 AM  Result Value Ref Range Status   MRSA by PCR POSITIVE (A) NEGATIVE Final    Comment:        The GeneXpert MRSA Assay (FDA approved for NASAL specimens only), is one component of a comprehensive MRSA colonization surveillance program. It is not intended to diagnose MRSA infection nor to guide or monitor treatment for MRSA infections. RESULT CALLED TO, READ BACK BY AND VERIFIED WITH: HIN  12/22/14 MKELLY     Scheduled Meds: . diclofenac sodium  2 g Topical QID  . DULoxetine  30 mg Oral Daily  . latanoprost  1 drop Both Eyes QHS  . mirabegron ER  25 mg Oral Daily  . mupirocin ointment   Nasal BID  . pantoprazole  40 mg Oral Daily  . predniSONE  10 mg Oral Q breakfast  . Rivaroxaban  15 mg Oral Daily  . simvastatin  10 mg Oral q1800  . tamsulosin  0.4 mg Oral Daily   Continuous Infusions:    Jason [name], Student-PA  Pamella Pert, MD [facility] Pager [phone]. If 7 PM - 7 AM, please contact night-coverage at [email or web address], password [facility] 12/22/2014, 9:16 AM  LOS: 0 days

## 2014-12-22 NOTE — Progress Notes (Signed)
*  PRELIMINARY RESULTS* Vascular Ultrasound Carotid Duplex (Doppler) has been completed.  Preliminary findings: Bilaterally 1-39% ICA stenosis. Right ECA >50% stenosis.   Lower extremity venous duplex = negative for DVT and baker's cyst.   Farrel Demark, RDMS, RVT  12/22/2014, 3:57 PM

## 2014-12-22 NOTE — Progress Notes (Signed)
MRSA nasal mucosa screening result: positive. NP paged with new order. Contact precaution in placed.

## 2014-12-23 LAB — BASIC METABOLIC PANEL
Anion gap: 10 (ref 5–15)
BUN: 31 mg/dL — AB (ref 6–20)
CALCIUM: 8.9 mg/dL (ref 8.9–10.3)
CO2: 29 mmol/L (ref 22–32)
Chloride: 103 mmol/L (ref 101–111)
Creatinine, Ser: 1.62 mg/dL — ABNORMAL HIGH (ref 0.61–1.24)
GFR calc Af Amer: 41 mL/min — ABNORMAL LOW (ref >60)
GFR, EST NON AFRICAN AMERICAN: 35 mL/min — AB (ref >60)
GLUCOSE: 101 mg/dL — AB (ref 65–99)
Potassium: 4.2 mmol/L (ref 3.5–5.1)
Sodium: 142 mmol/L (ref 135–145)

## 2014-12-23 LAB — HEMOGLOBIN A1C
Hgb A1c MFr Bld: 5.9 % — ABNORMAL HIGH (ref 4.8–5.6)
Mean Plasma Glucose: 123 mg/dL

## 2014-12-23 LAB — GLUCOSE, CAPILLARY
Glucose-Capillary: 113 mg/dL — ABNORMAL HIGH (ref 65–99)
Glucose-Capillary: 103 mg/dL — ABNORMAL HIGH (ref 65–99)
Glucose-Capillary: 115 mg/dL — ABNORMAL HIGH (ref 65–99)

## 2014-12-23 LAB — CBC
HCT: 42.4 % (ref 39.0–52.0)
Hemoglobin: 13.7 g/dL (ref 13.0–17.0)
MCH: 32.3 pg (ref 26.0–34.0)
MCHC: 32.3 g/dL (ref 30.0–36.0)
MCV: 100 fL (ref 78.0–100.0)
PLATELETS: 174 10*3/uL (ref 150–400)
RBC: 4.24 MIL/uL (ref 4.22–5.81)
RDW: 15.4 % (ref 11.5–15.5)
WBC: 8 10*3/uL (ref 4.0–10.5)

## 2014-12-23 MED ORDER — ALPRAZOLAM 0.25 MG PO TABS: 0.2500 mg | ORAL_TABLET | Freq: Two times a day (BID) | ORAL | Status: DC | PRN

## 2014-12-23 MED ORDER — HYDROCODONE-ACETAMINOPHEN 5-325 MG PO TABS: 1.0000 | ORAL_TABLET | Freq: Four times a day (QID) | ORAL | Status: DC | PRN

## 2014-12-23 MED ORDER — SIMVASTATIN 40 MG PO TABS: 40.0000 mg | ORAL_TABLET | Freq: Every day | ORAL | Status: DC

## 2014-12-23 MED ORDER — RIVAROXABAN 15 MG PO TABS: 15.0000 mg | ORAL_TABLET | Freq: Every day | ORAL | Status: DC

## 2014-12-23 MED ORDER — ASPIRIN 81 MG PO TBEC: 81.0000 mg | DELAYED_RELEASE_TABLET | Freq: Every day | ORAL | Status: AC

## 2014-12-23 NOTE — Progress Notes (Signed)
Pt ambulated with this nurse and family member standby assist to the bathroom. Pt impulsive stated that he couldn't hold it. Gait steady. Will continue to monitor.

## 2014-12-23 NOTE — Progress Notes (Signed)
Pt discharging with daughter at this time [facility] all personal belongings. Discharge paperwork along with prescriptions and  instructions given to daughter. Daughter requested to speak with Md prior to pt leaving. Md spoke with daughter. IV discontinued, dry dressing applied. Pt denies pain or discomfort. No noted distress. Report to be called in [facility].

## 2014-12-23 NOTE — Care Management Note (Signed)
Case Management Note  Patient Details  Name: Jason [name] MRN: [id number] Date of Birth: 10-06-1919  Subjective/Objective:                    Action/Plan: Patient admitted with CVA. [facility]. Awaiting PT/OT recommendations for discharge dispositions. CM will continue to follow for discharge needs.  Expected Discharge Date:   (Pending)               Expected Discharge Plan:  Assisted Living / Rest Home  In-House Referral:     Discharge planning Services     Post Acute Care Choice:    Choice offered to:     DME Arranged:    DME Agency:     HH Arranged:    HH Agency:     Status of Service:  In process, will continue to follow  Medicare Important Message Given:    Date Medicare IM Given:    Medicare IM give by:    Date Additional Medicare IM Given:    Additional Medicare Important Message give by:     If discussed at Long Length of Stay Meetings, dates discussed:    Additional Comments:  [name], RN 12/23/2014, 9:34 AM

## 2014-12-23 NOTE — Progress Notes (Signed)
Occupational Therapy Evaluation Patient Details Name: Jason [name] MRN: [id number] DOB: 1919-09-23 Today's Date: 12/23/2014    History of Present Illness Patient is a 81 y/o male admitted with slurred speech and feeling a numbness on the lower portion of the left side of his mouth. Mr brain without contrast revealed 13 mm acute nonhemorrhagic ischemic infarct within the deep white matter of the right centrum semi ovale as well as 7 mm acute nonhemorrhagic ischemic infarct within the right cerebellar hemisphere. He had recent hospitalization November 02, 2014 for Lt.. CEA due to carotid artery stenosis.  His PMH includes hyperlipidemia, reflux, peripheral neuropathy, decreased hearing, chronic dizziness, CAD, CVA, blindness L eye, a fib, DVT and pulmonary embolus with IVC filter.   Clinical Impression   Pt admitted with the above diagnoses and presents with below problem list. Pt will benefit from continued acute OT to address the below listed deficits and maximize independence with BADLs prior to d/c back to ALF. PTA pt was mod I for ADLs with occasional supervision as needed for showering. Pt is currently min guard for LB ADLs, transfers, and functional mobility. Son present for session. Session details below. OT to continue to follow acutely.      Follow Up Recommendations  Home health OT;Supervision/Assistance - 24 hour    Equipment Recommendations  None recommended by OT    Recommendations for Other Services PT consult     Precautions / Restrictions Precautions Precautions: Fall Restrictions Weight Bearing Restrictions: No      Mobility Bed Mobility               General bed mobility comments: in OOB with nursing at start of session  Transfers Overall transfer level: Needs assistance Equipment used: Rolling walker (2 wheeled) Transfers: Sit to/from Stand Sit to Stand: Min guard         General transfer comment: from recliner, EOB; min guard for safety      Balance Overall balance assessment: Needs assistance         Standing balance support: Bilateral upper extremity supported;During functional activity Standing balance-Leahy Scale: Poor Standing balance comment: external support for balance                            ADL Overall ADL's : Needs assistance/impaired Eating/Feeding: Set up;Sitting   Grooming: Min guard;Standing;Oral care Grooming Details (indicate cue type and reason): sink and rw for external support Upper Body Bathing: Sitting;Supervision/ safety;Set up   Lower Body Bathing: Min guard;Sit to/from stand   Upper Body Dressing : Sitting;Supervision/safety;Set up   Lower Body Dressing: Min guard;Sit to/from stand   Toilet Transfer: Min guard;Ambulation;Comfort height toilet;Grab bars;RW   Toileting- ArchitectClothing Manipulation and Hygiene: Min guard;Sit to/from stand   Tub/ Shower Transfer: Min guard;Walk-in shower;Shower Dealerseat;Rolling walker Tub/Shower Transfer Details (indicate cue type and reason): Discussed having someone with pt when he showers at ALF at least initially until pt is fully back to baseline.  Functional mobility during ADLs: Min guard;Rolling walker General ADL Comments: Pt reports he does not feel fully back to baseline with standing balance. Cues needed at times for safety/technique with rw despite pt using a rw PTA. Discussed fall prevention and safety with ADLs.     Vision     Perception     Praxis      Pertinent Vitals/Pain Pain Assessment: No/denies pain     Hand Dominance Right   Extremity/Trunk Assessment Upper Extremity Assessment Upper Extremity  Assessment: Overall WFL for tasks assessed   Lower Extremity Assessment Lower Extremity Assessment: Defer to PT evaluation       Communication Communication Communication: HOH   Cognition Arousal/Alertness: Awake/alert Behavior During Therapy: WFL for tasks assessed/performed Overall Cognitive Status: Within Functional  Limits for tasks assessed (at baseline- son present for session)                     General Comments       Exercises       Shoulder Instructions      Home Living Family/patient expects to be discharged to:: Assisted living                             Home Equipment: Walker - 4 wheels;Shower seat;Bedside commode;Hand held shower head;Grab bars - toilet;Grab bars - tub/shower   Additional Comments:        Prior Functioning/Environment Level of Independence: Independent with assistive device(s)        Comments: Pt and son report pt mod I for ADLs. Uses a cane for functional mobility. Staff at ALF can be nearby when pt showers.    OT Diagnosis: Other (comment) (decreased balance)   OT Problem List: Impaired balance (sitting and/or standing);Decreased knowledge of use of DME or AE;Decreased knowledge of precautions   OT Treatment/Interventions: Self-care/ADL training;DME and/or AE instruction;Therapeutic activities;Patient/family education;Balance training    OT Goals(Current goals can be found in the care plan section) Acute Rehab OT Goals Patient Stated Goal: back to ALF; son reports pt is very happy there OT Goal Formulation: With patient/family Time For Goal Achievement: 12/30/14 Potential to Achieve Goals: Good ADL Goals Pt Will Perform Grooming: with modified independence;standing Pt Will Perform Upper Body Dressing: with modified independence;sitting (gathering clothes and donning) Pt Will Perform Lower Body Dressing: with modified independence;sit to/from stand (gathering clothes and donning) Pt Will Perform Tub/Shower Transfer: Shower transfer;with modified independence;ambulating;shower seat  OT Frequency: Min 2X/week   Barriers to D/C:            Co-evaluation              End of Session Equipment Utilized During Treatment: Gait belt;Rolling walker  Activity Tolerance: Patient tolerated treatment well;Other (comment) (c/o some  dizziness during OOB/mobility) Patient left: in bed;with call bell/phone within reach;with chair alarm set;with family/visitor present   Time: 1021-1040 OT Time Calculation (min): 19 min Charges:  OT General Charges $OT Visit: 1 Procedure OT Evaluation $Initial OT Evaluation Tier I: 1 Procedure G-Codes:    Pilar Grammes Jan 04, 2015, 11:06 AM

## 2014-12-23 NOTE — Progress Notes (Signed)
Utilization review completed. Nahomi Hegner, RN, BSN. 

## 2014-12-23 NOTE — Evaluation (Signed)
Physical Therapy Evaluation Patient Details Name: Jason [name] MRN: [id number] DOB: October 07, 1919 Today's Date: 12/23/2014   History of Present Illness  Patient is a 81 y/o male admitted with slurred speech and feeling a numbness on the lower portion of the left side of his mouth. Imaging revealed acute ischemic infarct within the right centrum semi ovale as well as acute nonhemorrhagic ischemic infarct within the right cerebellar hemisphere. He had recent hospitalization 11/02/14 for Lt. CEA due to carotid artery stenosis.  His PMH includes peripheral neuropathy, HOH, chronic dizziness, CAD, CVA, blindness L eye, a fib, DVT and PE with IVC filter.  Clinical Impression  Pt admitted with above diagnosis. Pt currently with functional limitations due to the deficits listed below (see PT Problem List). At the time of PT eval pt was able to perform transfers and ambulation with min guard assist and use of RW. Pt reports he uses a rollator at home (ALF) and tries to stay active with group exercise and planned activities at his residence. Feel pt will be able to return to ALF with HHPT to follow for strengthening and balance training. Pt will benefit from skilled PT to increase their independence and safety with mobility to allow discharge to the venue listed below.       Follow Up Recommendations Home health PT;Supervision for mobility/OOB    Equipment Recommendations  None recommended by PT    Recommendations for Other Services       Precautions / Restrictions Precautions Precautions: Fall Restrictions Weight Bearing Restrictions: No      Mobility  Bed Mobility               General bed mobility comments: Pt received sitting in recliner.  Transfers Overall transfer level: Needs assistance Equipment used: Rolling walker (2 wheeled) Transfers: Sit to/from Stand Sit to Stand: Min guard         General transfer comment: Pt demonstrated proper hand placement and safety awareness.    Ambulation/Gait Ambulation/Gait assistance: Min guard Ambulation Distance (Feet): 125 Feet Assistive device: Rolling walker (2 wheeled) Gait Pattern/deviations: Step-through pattern;Decreased stride length;Trunk flexed Gait velocity: Likely age-appropriate Gait velocity interpretation: at or above normal speed for age/gender General Gait Details: Grossly flexed posture however likely not far from baseline. Pt with decreased floor clearance and scuffing shoes on floor with each step. Was able to increase floor clearance with cues but was not able to maintain.   Stairs            Wheelchair Mobility    Modified Rankin (Stroke Patients Only)       Balance Overall balance assessment: Needs assistance Sitting-balance support: Feet supported;No upper extremity supported Sitting balance-Leahy Scale: Good     Standing balance support: Bilateral upper extremity supported;During functional activity Standing balance-Leahy Scale: Poor Standing balance comment: Pt requires UE support for dynamic standing activity.                              Pertinent Vitals/Pain Pain Assessment: No/denies pain    Home Living Family/patient expects to be discharged to:: Assisted living               Home Equipment: Walker - 4 wheels;Shower seat;Bedside commode;Hand held shower head;Grab bars - toilet;Grab bars - tub/shower Additional Comments:      Prior Function Level of Independence: Independent with assistive device(s)         Comments: Pt and son report pt mod  I for ADLs. Uses a cane for functional mobility. Staff at ALF can be nearby when pt showers.     Hand Dominance   Dominant Hand: Right    Extremity/Trunk Assessment   Upper Extremity Assessment: Overall WFL for tasks assessed (4+/5 to 5/5 strength bilaterally)           Lower Extremity Assessment: Overall WFL for tasks assessed (4+/5 to 5/5 strength bilaterally)      Cervical / Trunk Assessment:  Normal  Communication   Communication: HOH  Cognition Arousal/Alertness: Awake/alert Behavior During Therapy: WFL for tasks assessed/performed Overall Cognitive Status: Within Functional Limits for tasks assessed (Baseline per OT note)                      General Comments      Exercises        Assessment/Plan    PT Assessment Patient needs continued PT services  PT Diagnosis Difficulty walking   PT Problem List Decreased strength;Decreased range of motion;Decreased activity tolerance;Decreased balance;Decreased mobility;Decreased knowledge of use of DME;Decreased safety awareness;Decreased knowledge of precautions  PT Treatment Interventions DME instruction;Gait training;Stair training;Functional mobility training;Therapeutic activities;Therapeutic exercise;Neuromuscular re-education;Patient/family education   PT Goals (Current goals can be found in the Care Plan section) Acute Rehab PT Goals Patient Stated Goal: back to ALF PT Goal Formulation: With patient Time For Goal Achievement: 12/30/14 Potential to Achieve Goals: Good    Frequency Min 4X/week   Barriers to discharge        Co-evaluation               End of Session Equipment Utilized During Treatment: Gait belt Activity Tolerance: Patient tolerated treatment well Patient left: in chair;with call bell/phone within reach Nurse Communication: Mobility status         Time: 1610-9604 PT Time Calculation (min) (ACUTE ONLY): 16 min   Charges:   PT Evaluation $Initial PT Evaluation Tier I: 1 Procedure     PT G CodesConni Slipper 01-18-2015, 11:47 AM   Conni Slipper, PT, DPT Acute Rehabilitation Services Pager: [phone]

## 2014-12-23 NOTE — Clinical Social Work Placement (Signed)
   CLINICAL SOCIAL WORK PLACEMENT  NOTE  Date:  12/23/2014  Patient Details  Name: Jason [name] MRN: [id number] Date of Birth: [birth year]  Clinical Social Work is seeking post-discharge placement for this patient at the Skilled  Nursing Facility level of care (*CSW will initial, date and re-position this form in  chart as items are completed):  Yes   Patient/family provided with [facility] Clinical Social Work Department's list of facilities offering this level of care within the geographic area requested by the patient (or if unable, by the patient's family).  Yes   Patient/family informed of their freedom to choose among providers that offer the needed level of care, that participate in Medicare, Medicaid or managed care program needed by the patient, have an available bed and are willing to accept the patient.  Yes   Patient/family informed of 's ownership interest in [facility] and [facility], as well as of the fact that they are under no obligation to receive care at these facilities.  PASRR submitted to EDS on       PASRR number received on       Existing PASRR number confirmed on 12/23/14     FL2 transmitted to all facilities in geographic area requested by pt/family on 12/23/14     FL2 transmitted to all facilities within larger geographic area on       Patient informed that his/her managed care company has contracts with or will negotiate with certain facilities, including the following:            Patient/family informed of bed offers received.  Patient chooses bed at       Physician recommends and patient chooses bed at      Patient to be transferred to   on  .  Patient to be transferred to facility by       Patient family notified on   of transfer.  Name of family member notified:        PHYSICIAN Please sign FL2     Additional Comment:    _______________________________________________ Derenda FennelBashira Grethel Zenk, MSW, LCSWA [phone]  [phone] 12/23/2014 10:47 AM

## 2014-12-23 NOTE — Clinical Social Work Note (Signed)
[facility] has accepted patient back as resident. PT/OT evaluations have been faxed to facility for review. Facility requesting hard script for Home Health orders from MD. CSW has notified MD.  CSW has also notified pt's dtr via telephone who is agreeable with [facility] plan [facility]. Pt's d[facility] to [facility] once pt is ready.   CSW remains available as needed.  Derenda FennelBashira Acquanetta Cabanilla, MSW, LCSWA [phone] 12/23/2014 12:19 PM

## 2014-12-23 NOTE — Clinical Social Work Note (Signed)
Clinical Social Worker facilitated patient discharge including contacting patient family and facility to confirm patient discharge plans.  Clinical information faxed to facility and family agreeable with plan. Patient's daughter to transport patient back to ALF, [facility].  RN to call report prior to discharge.  DC packet prepared and on chart.   Clinical Social Worker will sign off for now as social work intervention is no longer needed. Please consult us again if new need arises.  Derenda FennelBashira Bracen Schum, MSW, LCSWA [phone] 12/23/2014 12:23 PM

## 2014-12-23 NOTE — Clinical Social Work Note (Signed)
Clinical Social Work Assessment  Patient Details  Name: Jason [name] MRN: [id number] Date of Birth: 12/01/1919  Date of referral:  12/23/14               Reason for consult:  Discharge Planning, Facility Placement                Permission sought to share information with:  Family Supports, Case Manager Permission granted to share information::  Yes, Verbal Permission Granted  Name::      Junie Panning([name] Pruitt )  Agency::   (ALF vs SNF's )  Relationship::   (Daughter )  Contact Information:   [phone])  Housing/Transportation Living arrangements for the past 2 months:  Assisted Living Facility Source of Information:  Adult Children Patient Interpreter Needed:  None Criminal Activity/Legal Involvement Pertinent to Current Situation/Hospitalization:  No - Comment as needed Significant Relationships:  Adult Children Lives with:  Facility Resident Do you feel safe going back to the place where you live?  Yes Need for family participation in patient care:  Yes (Comment)  Care giving concerns:  PT to evaluate patient to determine appropriate level of care. ALF vs SNF.  Social Worker assessment / plan:  Visual merchandiserClinical Social Worker spoke with patient's daughter at length in reference to post-acute placement for SNF. CSW introduced CSW role and SNF placement. Pt's dtr stated she is familiar with SNF process as patient has been a short-term resident of [facility] in the past. Pt's dtr confirmed that pt is from [facility], [facility]. Pt has been a resident for the past 2 years. Pt's dtr stated that [facility] and if pt is appropriate to return she would like him to. CS[facility] to [facility] with [facility]. CSW to also complete FL-2 and [facility]. No further concerns reported at this time by family. CSW will continue to follow pt and pt's daughter for continued support and to facilitate pt's discharge needs once medically stable.   Employment  status:  Retired Database administratornsurance information:  Managed Medicare PT Recommendations:  Not assessed at this time Information / Referral to community resources:  Skilled Nursing Facility  Patient/Family's Response to care:  Pt's dtr agreeable to ALF vs SNF placement. Pt's dtr involved in pt's care and supportive of discharge planning. Pt's dtr appreciated social work intervention.   Patient/Family's Understanding of and Emotional Response to Diagnosis, Current Treatment, and Prognosis:  Pt's dtr knowledgeable of medical intervention and on-going treatment.   Emotional Assessment Appearance:  Appears stated age Attitude/Demeanor/Rapport:  Unable to Assess Affect (typically observed):  Unable to Assess Orientation:  Oriented to Situation, Oriented to  Time, Oriented to Place, Oriented to Self Alcohol / Substance use:  Not Applicable Psych involvement (Current and /or in the community):  No (Comment)  Discharge Needs  Concerns to be addressed:  Care Coordination Readmission within the last 30 days:  No Current discharge risk:  Dependent with Mobility Barriers to Discharge:  Continued Medical Work up   The Sherwin-WilliamsBashira Phoenix Riesen, MSW, LCSWA [phone] 12/23/2014 10:47 AM

## 2014-12-23 NOTE — Evaluation (Signed)
Speech Language Pathology Evaluation Patient Details Name: Jason [name] MRN: [id number] DOB: 08/25/1919 Today's Date: 12/23/2014 Time: [id number] SLP Time Calculation (min) (ACUTE ONLY): 17 min  Problem List:  Patient Active Problem List   Diagnosis Date Noted  . Essential hypertension 12/22/2014  . Acute ischemic stroke (HCC) 12/22/2014  . HLD (hyperlipidemia)   . Pulmonary vascular congestion 11/17/2014  . Bilateral edema of lower extremity 11/17/2014  . Diastolic CHF, acute (HCC) 11/17/2014  . CHF (congestive heart failure) (HCC) 11/17/2014  . Carotid stenosis 11/02/2014  . Orthostatic hypotension 06/29/2014  . Contusion   . Syncope and collapse 05/22/2014  . Scalp laceration 05/22/2014  . Acute renal failure superimposed on stage 3 chronic kidney disease (HCC) 05/22/2014  . Acute bronchitis 05/22/2014  . Gait disorder 02/04/2014  . Hereditary and idiopathic peripheral neuropathy 02/04/2014  . SOB (shortness of breath) 12/17/2012  . Breast pain 09/08/2012  . Breast lump 09/08/2012  . OA (osteoarthritis) of knee 09/08/2012  . Peripheral edema 09/08/2012  . History of DVT (deep vein thrombosis) 03/02/2012  . Dehydration 03/02/2012  . UTI (lower urinary tract infection) 12/18/2011  . Sepsis (HCC) 12/18/2011  . Transaminitis 12/18/2011  . PAF (paroxysmal atrial fibrillation) (HCC) 12/09/2011  . ARF (acute renal failure) (HCC) 12/05/2011  . Herpes zoster 12/02/2011  . Thrombocytopenia (HCC) 12/02/2011  . CAD (coronary artery disease) 12/02/2011  . CKD (chronic kidney disease), stage III 12/02/2011  . Chronic constipation 10/04/2010  . HYPERTENSION, BENIGN 09/17/2009  . Irritable bowel syndrome 10/15/2007  . VERTIGO 06/25/2007  . ANXIETY 05/27/2007  . PERIPHERAL VASCULAR DISEASE 01/18/2007  . GERD 01/18/2007  . HYPERLIPIDEMIA 10/03/2006   Past Medical History:  Past Medical History  Diagnosis Date  . PVD (peripheral vascular disease) (HCC)   . CAD (coronary artery  disease)     a. 12/2005 - PCI to OM  . Hyperlipidemia   . Cerebrovascular accident (HCC)   . DVT femoral (deep venous thrombosis) with thrombophlebitis (HCC) 03/2007    [name]/notes 07/13/2010  . GERD (gastroesophageal reflux disease)   . Edema   . Vertigo   . IBS (irritable bowel syndrome)   . Duodenitis   . Gastritis   . Esophageal stricture   . Cellulitis   . Pulmonary embolism (HCC)   . Prostatic hypertrophy   . Frequent falls   . Subdural hematoma (HCC)   . PTSD (post-traumatic stress disorder)   . Vertigo   . TIA (transient ischemic attack)     a. 01/2006 and 05/2006.   [name]. Hiatal hernia     periferal neuropathy  . Hx of echocardiogram     Echo 9/13:  Mild LVH, EF 60-65%, Gr 1 diast dysfn, mild AI, mild BAE  . Gout   . Chronic low back pain   . Gait disorder   . Peripheral neuropathy (HCC)   . Hydrocele   . History of shingles     Left lumbar  . HOH (hard of hearing)     hearing aid, totally in R ear ( no hearing aid in L )   . Myocardial infarction [facility](HCC) "years ago"    "dr said I'd had a small one"  . Daily headache   . Depression   . Urinary frequency   . Blind left eye   . Hypertension   . Dysrhythmia   . Arthritis   . Anxiety     PTSD   Past Surgical History:  Past Surgical History  Procedure Laterality Date  . Hand  surgery Right     "replaced bone in my finger"  . Angioplasty    . Carotid endarterectomy Left   . Coronary angioplasty with stent placement      left circumflex  . Cataract extraction, bilateral    . Tonsillectomy    . Tympanic membrane repair Right 1978    [name] 07/27/2010; "couldn'getting so t couldn't hear; had noise in my ear; didn't correct either  . Hand contracture release Right     palm/notes 07/27/2010  . Esophagogastroduodenoscopy (egd) with esophageal dilation    . Insertion of vena cava filter  2013    h/o PE  . Endarterectomy Left 11/02/2014    Procedure: LEFT  CAROTID ARTERY ENDARTERECTOMY  WITH DACRON PATCH ANGIOPLASTY;   Surgeon: Sherren Kerns, MD;  Location: [facility] OR;  Service: Vascular;  Laterality: Left;   HPI:  Patient is a 81 y/o male admitted with slurred speech and feeling a numbness on the lower portion of the left side of his mouth. Imaging revealed acute ischemic infarct within the right centrum semi ovale as well as acute nonhemorrhagic ischemic infarct within the right cerebellar hemisphere. He had recent hospitalization 11/02/14 for Lt. CEA due to carotid artery stenosis. His PMH includes peripheral neuropathy, HOH, chronic dizziness, CAD, CVA, blindness L eye, a fib, DVT and PE with IVC filter.   Assessment / Plan / Recommendation Clinical Impression  Speech evaluation complete due to report of slurred speech upon admission.  Oral motor exam revealed trace lingual deviation to the left and mild lingual weakness. Despite weakness patient's speech was fully intelligible with precise consonant productions in conversation.  Patient reports that unfamiliar listeners such as RN and other hospital staff are able to understand him easily.  Education completed with patient who verbalized understanding of information.  As a result, no skilled SLP services are warranted at this time; SLP signing off.      SLP Assessment  Patient does not need any further Speech Lanaguage Pathology Services    Follow Up Recommendations  None            Pertinent Vitals/Pain Pain Assessment: No/denies pain   SLP Goals  Progression toward goals:  (Eval ) Patient/Family Stated Goal: to go home  SLP Evaluation Prior Functioning  Cognitive/Linguistic Baseline: Information not available Type of Home: Assisted living                Oral / Motor Oral Motor/Sensory Function Overall Oral Motor/Sensory Function: Impaired Labial ROM: Reduced left Labial Strength: Reduced Lingual Symmetry: Abnormal symmetry left Lingual Strength: Reduced Velum: Within Functional Limits Mandible: Within Functional Limits Motor  Speech Overall Motor Speech: Appears within functional limits for tasks assessed   GO     Charlane Ferretti., CCC-SLP [phone]  Malie Kashani 12/23/2014, 3:28 PM

## 2014-12-23 NOTE — Discharge Summary (Addendum)
Physician Discharge Summary  Jason [name] ZOX:[id number] DOB: 08/13/1919 DOA: 12/21/2014  PCP: Leo Grosser, MD  Admit date: 12/21/2014 Discharge date: 12/23/2014  Time spent: 45 minutes  Recommendations for Outpatient Follow-up:  1. Please repeat fasting lipid panel and CMP in 3 months to follow up on elevated LDL as simvastatin dose has increased from 10 mg to 40 mg. 2. Please monitor BPs closely upon discharge as home medications are restarted. Will need close follow up regarding volume status and kidney function upon resuming lasix.   Discharge Diagnoses:  Principal Problem:   Acute ischemic stroke [facility]) Active Problems:   CKD (chronic kidney disease), stage III   PAF (paroxysmal atrial fibrillation) (HCC)   History of DVT (deep vein thrombosis)   Hereditary and idiopathic peripheral neuropathy   CHF (congestive heart failure) (HCC)   Essential hypertension   HLD (hyperlipidemia)  Discharge Condition: Stable  Diet recommendation: Heart healthy/no added salt  Filed Weights   12/22/14 0157  Weight: 86.864 kg (191 lb 8 oz)   History of present illness:  Jason [name] is a 81 y.o. male coming from ALF with a PMHx of PTSD, PVD s/p L CEA in 2016, CVA, CAD s/p PCI in 2007, hx DVT, hiatal hernia, gout, and HTN who presented to ED with worsening vertigo and slurred speech. The patient was in his usual state of health until 1 day prior to presentation when he woke up with worsened vertigo than usual. He felt moderately dizzy all day, without relief.Today, he noticed that his speech was slurred and his left side of his mouth felt "heavy", so he called his daughter who brought him to the ED.  In the ED, the patient was noted to have slurred speech. A non-contrasted head CT was unremarkable but an MRI brain showed two new acute infarcts in R cerebellum and R white matter. [facility] admitted for new ischemic stroke.  Hospital Course by problem list:  Acute ischemic CVA: Presented to ED  with left sided mouth weakness, worsened dizziness, and slurred speech. MRI brain revealed acute right centrum semi ovale infarct as well as a second infarct in right cerebellar hemisphere which explains these symptoms. Recent echocardiogram with preserved EF and no embolic foci, carotid doppler and LE venous US negative. Patient's home simvastatin was increased from 10 mg to 40 mg to lower LDL with goal of <70 and neurology has recommended addition of 81 mg ASA QD. Speech is now coherent with minimal to no slurring, and facial weakness has improved. Patient still complains of some dizziness with ambulation and will require close supervision for ambulation at SNF.    Paroxysmal Atrial Fibrillation: Rate controlled, sinus rhythm on telemetry. Continue home xarelto.  Chronic Diastolic CHF: Recent echocardiogram 9/6 showed EF of 50-55% and grade 1 diastolic dysfunction. Patient is euvolemic and asymptomatic at discharge. Will restart home lasix with close follow up regarding volume status.  Acute on chronic kidney injury, CKD III: Patient's creatinine has returned to near baseline upon discharge with IVF repletion. Acute injury likely prerenal azotemia due to dehydration from poor PO intake and diuretics. Lasix and losartan to be restarted upon discharge with close outpatient follow up of fluid status and kidney function.  Arthritis: Stable, pain is chronic and unchanged during this hospitalization. Continue topical diclofenac and prednisone as before.  Hypertension: Home BP medications were held during this hospitalization to allow permissive HTN. These medications will be  restarted upon discharge with close monitoring of BPs as outpatient.  PTSD: Stable,  continue home medication - duloxetine as before.  History of DVT: Stable, patient asymptomatic at this time. Repeat LE Venous US was negative during this hospitalization. Continue xarelto.   Procedures:  Carotid doppler   LE Venous  US  Consultations:  Neurology  Discharge Exam: Filed Vitals:   12/23/14 1000  BP: 166/72  Pulse: 57  Temp: 98.6 F (37 C)  Resp: 18   GENERAL: NAD, appears stated age  HEENT: head NCAT, no scleral icterus. Mucous membranes are moist.   NECK: Supple. No LAD  LUNGS: Clear to auscultation. No wheezing or crackles  HEART: Regular rate and rhythm. 2+ pulses, no JVD, no peripheral edema  ABDOMEN: Soft, non-distended, non-tender. Positive bowel sounds.  EXTREMITIES: Without any cyanosis or clubbing. Good muscle tone  NEUROLOGIC: Alert and oriented, speech clear and appropriate. Strength 5/5 in all 4.  PSYCHIATRIC: Normal mood and affect  SKIN: No ulceration, induration, rashes present on limited exam  Discharge Instructions  Discharge Instructions    Ambulatory referral to Neurology    Complete by:  As directed   Pt [facility]. Thanks.     Diet - low sodium heart healthy/no added salt Complete by:  As directed      Increase activity slowly    Complete by:  As directed           Current Discharge Medication List    START taking these medications   Details  aspirin EC 81 MG EC tablet Take 1 tablet (81 mg total) by mouth daily.      CONTINUE these medications which have CHANGED   Details  ALPRAZolam (XANAX) 0.25 MG tablet Take 1 tablet (0.25 mg total) by mouth 2 (two) times daily as needed for anxiety. Qty: 10 tablet, Refills: 0    HYDROcodone-acetaminophen (NORCO) 5-325 MG tablet Take 1 tablet by mouth every 6 (six) hours as needed for moderate pain or severe pain. Qty: 20 tablet, Refills: 0    simvastatin (ZOCOR) 40 MG tablet Take 1 tablet (40 mg total) by mouth daily at 6 PM.      CONTINUE these medications which have NOT CHANGED   Details  acetaminophen (TYLENOL) 500 MG tablet Take 1,000 mg by mouth 2 (two) times daily. Take every day per MAR    alum & mag hydroxide-simeth (MAALOX/MYLANTA) 200-200-20 MG/5ML  suspension Take 30 mLs by mouth every 6 (six) hours as needed for indigestion or heartburn.    DEXILANT 60 MG capsule TAKE ONE CAPSULE BY MOUTH EVERY DAY Qty: 30 capsule, Refills: 11    diclofenac sodium (VOLTAREN) 1 % GEL Apply 2 g topically 4 (four) times daily. Qty: 100 g, Refills: 5   Associated Diagnoses: Pain in joint, ankle and foot, left    docusate sodium (COLACE) 100 MG capsule Take 1 capsule (100 mg total) by mouth at bedtime. Qty: 30 capsule, Refills: 11    DULoxetine (CYMBALTA) 30 MG capsule TAKE ONE CAPSULE BY MOUTH EVERY DAY Qty: 30 capsule, Refills: 6    furosemide (LASIX) 40 MG tablet Take 40 mg by mouth daily.    latanoprost (XALATAN) 0.005 % ophthalmic solution Place 1 drop into both eyes at bedtime.    loperamide (IMODIUM) 2 MG capsule Take 2 mg by mouth as needed for diarrhea or loose stools (Not to exceed 8 doses in 24 hours).    losartan (COZAAR) 25 MG tablet Take 25 mg by mouth daily.    magnesium hydroxide (MILK  OF MAGNESIA) 400 MG/5ML suspension Take 30 mLs by mouth at bedtime as needed for mild constipation.    meclizine (ANTIVERT) 12.5 MG tablet Take 12.5 mg by mouth every 12 (twelve) hours as needed for dizziness.    mupirocin ointment (BACTROBAN) 2 % 1 Application, nasally, 2 times daily for 5 days prior to surgery Qty: 22 g, Refills: 0   Associated Diagnoses: MRSA (methicillin resistant staph aureus) culture positive    MYRBETRIQ 25 MG TB24 tablet TAKE 1 TABLET BY MOUTH EVERY DAY Qty: 30 tablet, Refills: 11    nitroGLYCERIN (NITROSTAT) 0.4 MG SL tablet Place 0.4 mg under the tongue every 5 (five) minutes as needed. Chest pain    ondansetron (ZOFRAN) 4 MG tablet Take 4 mg by mouth every 8 (eight) hours as needed for nausea.    polyethylene glycol powder (GLYCOLAX/MIRALAX) powder MIX 17 GRAMS WITH 4 TO 6 OUNCES OF WATER AND DRINK ONCE DAILY AS NEEDED FOR CONSTIPATION Qty: 527 g, Refills: 2    polyvinyl alcohol (LIQUIFILM TEARS) 1.4 % ophthalmic  solution Place 2 drops into both eyes 3 (three) times daily.    predniSONE (DELTASONE) 10 MG tablet Take 1 tablet (10 mg total) by mouth daily with breakfast. Qty: 30 tablet, Refills: 5   Associated Diagnoses: Neck pain, chronic    tamsulosin (FLOMAX) 0.4 MG CAPS capsule Take 0.4 mg by mouth daily.    XARELTO 15 MG TABS tablet TAKE 1 TABLET BY MOUTH EVERY DAY WITH SUPPER Qty: 30 tablet, Refills: 6       Allergies  Allergen Reactions  . Ativan [Lorazepam] Other (See Comments)    unknown  . Augmentin [Amoxicillin-Pot Clavulanate] Nausea And Vomiting    daughter states he can tolerate Amoxicillin ok Profuse vomiting  . Fludrocortisone Acetate Other (See Comments)    "leg swelling"  . Scopace [Scopolamine] Other (See Comments)    *patch "Made him crazy" per patients daughter    Follow-up Information    Follow up with Xu,Jindong, MD. Schedule an appointment as soon as possible for a visit on 02/23/2015.   Specialty:  Neurology   Why:  stroke clinic,[place] scheduled appt.02/23/15 1130am. PATIENT ARRIVE AT 11AM   Contact information:   [address] [place] [address] [phone]       Follow up with [facility] TOM, MD. Schedule an appointment as soon as possible for a visit in 2 weeks.   Specialty:  Family Medicine   Contact information:   [address] [place] [address] [phone]      The results of significant diagnostics from this hospitalization (including imaging, microbiology, ancillary and laboratory) are listed below for reference.    Significant Diagnostic Studies: Dg Chest 2 View  12/21/2014  CLINICAL DATA:  Slurred speech and weakness tonight. EXAM: CHEST  2 VIEW COMPARISON:  11/16/2014 FINDINGS: Normal heart size and pulmonary vascularity. No focal airspace disease or consolidation in the lungs. No blunting of costophrenic angles. No pneumothorax. Mediastinal contours appear intact. Degenerative changes in the spine and  shoulders. Calcification of the aorta. IMPRESSION: No active cardiopulmonary disease. Electronically Signed   By: Burman Nieves M.D.   On: 12/21/2014 22:39   Ct Head Wo Contrast  12/21/2014  CLINICAL DATA:  81 year old male with acute weakness and slurred speech for 1 day. EXAM: CT HEAD WITHOUT CONTRAST TECHNIQUE: Contiguous axial images were obtained from the base of the skull through the vertex without intravenous contrast. COMPARISON:  05/22/2014 and prior exams FINDINGS: Atrophy, chronic  small-vessel white matter ischemic changes again noted. Remote right caudate and high bilateral frontoparietal infarcts again identified. No acute intracranial abnormalities are identified, including mass lesion or mass effect, hydrocephalus, extra-axial fluid collection, midline shift, hemorrhage, or acute infarction. The visualized bony calvarium is unremarkable. IMPRESSION: No evidence of acute intracranial abnormality. Atrophy, chronic small-vessel white matter ischemic changes and remote infarcts as described. Electronically Signed   By: Harmon Pier M.D.   On: 12/21/2014 19:58   Mr Brain Wo Contrast  12/21/2014  CLINICAL DATA:  Initial evaluation for acute slurred speech, left-sided facial droop. EXAM: MRI HEAD WITHOUT CONTRAST TECHNIQUE: Multiplanar, multiecho pulse sequences of the brain and surrounding structures were obtained without intravenous contrast. COMPARISON:  Prior CT from earlier the same day. FINDINGS: Patchy area of restricted diffusion within the deep white matter of the right corona radiata measures 13 mm. Corresponding signal loss seen on ADC map. Additional 7 mm area of restricted diffusion within the left cerebellar hemisphere, also consistent with acute ischemic infarct (series 5, image 10). No associated hemorrhage or mass effect. No other infarct. Normal intravascular flow voids maintained. Diffuse prominence of the CSF containing spaces is compatible with generalized age-related cerebral  atrophy. There is encephalomalacia with gliosis within the high parietal regions bilaterally, likely related to remote ischemia. Small amount of chronic hemosiderin staining within the posterior right frontal parietal region. Small remote lacunar infarct within the right caudate head. Small remote cortical infarct within the right frontal lobe present as well. No mass lesion, midline shift, or mass effect. No hydrocephalus. No extra-axial fluid collection. Craniocervical junction within normal limits. Pituitary gland normal. No acute abnormality about the orbits. Sequela prior bilateral lens extraction noted. Mild mucosal thickening within the ethmoidal air cells. Paranasal sinuses are otherwise clear. Scattered opacity within the inferior right mastoid air cells. Bone marrow signal intensity within normal limits. No scalp soft tissue abnormality. IMPRESSION: 1. 13 mm acute nonhemorrhagic ischemic infarct within the deep white matter of the right centrum semi ovale. 2. 7 mm acute nonhemorrhagic ischemic infarct within the right cerebellar hemisphere. 3. Encephalomalacia within the bilateral parietal lobes and right frontal lobe, likely related to remote ischemia. Additional remote lacunar infarct within the right caudate head. 4. Generalized age-related cerebral atrophy with chronic microvascular ischemic disease. Electronically Signed   By: Rise Mu M.D.   On: 12/21/2014 23:58   Microbiology: Recent Results (from the past 240 hour(s))  MRSA PCR Screening     Status: Abnormal   Collection Time: 12/22/14  3:47 AM  Result Value Ref Range Status   MRSA by PCR POSITIVE (A) NEGATIVE Final    Comment:        The GeneXpert MRSA Assay (FDA approved for NASAL specimens only), is one component of a comprehensive MRSA colonization surveillance program. It is not intended to diagnose MRSA infection nor to guide or monitor treatment for MRSA infections. RESULT CALLED TO, READ BACK BY AND VERIFIED  WITH: HIN  12/22/14 MKELLY     Labs: Basic Metabolic Panel:  Recent Labs Lab 12/21/14 2003 12/22/14 0508 12/23/14 0700  NA 136 138 142  K 4.0 4.0 4.2  CL 98* 100* 103  CO2 GLUCOSE 87 85 101*  BUN 45* 40* 31*  CREATININE 2.13* 1.92* 1.62*  CALCIUM 8.5* 8.7* 8.9   Liver Function Tests:  Recent Labs Lab 12/21/14 2003  AST 19  ALT 13*  ALKPHOS 55  BILITOT 0.5  PROT 6.5  ALBUMIN 3.4*   No results  for input(s): LIPASE, AMYLASE in the last 168 hours. No results for input(s): AMMONIA in the last 168 hours. CBC:  Recent Labs Lab 12/21/14 2003 12/23/14 0700  WBC 8.8 8.0  NEUTROABS 6.1  --   HGB 13.2 13.7  HCT 40.1 42.4  MCV 97.8 100.0  PLT 196 174   Cardiac Enzymes: No results for input(s): CKTOTAL, CKMB, CKMBINDEX, TROPONINI in the last 168 hours. BNP: BNP (last 3 results)  Recent Labs  11/16/14 1942  BNP 598.4*   ProBNP (last 3 results) No results for input(s): PROBNP in the last 8760 hours.  CBG:  Recent Labs Lab 12/22/14 1039 12/22/14 1148 12/22/14 1631 12/22/14 2135 12/23/14 0637  GLUCAP 119* 113* 103* 94 103*   Signed:  Roosvelt Maser PA-S  [facility] 12/23/2014, 11:44 AM  Attending MD note  Patient was seen, examined,treatment plan was discussed with the PA-S.  I have personally reviewed the clinical findings, lab, imaging studies and management of this patient in detail. I agree with the documentation, as recorded by the PA-S.  81 year old gentleman on Xarelto brought to the ED for slurred speech, MRI brain showed right centrum semiovale and right cerebellar infarcts. Carotid Doppler negative for significant stenosis, echocardiogram negative for embolic source. LDL elevated and noted goal. No further recommendations from neurology-apart from increasing Zocor to 40 mg and ending baby aspirin. Seen by PT/OT-okay to go back to ALF with home health services. Per neurology-okay to continue  Xarelto on  discharge.   [facility]

## 2014-12-25 ENCOUNTER — Telehealth: Payer: Self-pay | Admitting: *Deleted

## 2014-12-25 DIAGNOSIS — R278 Other lack of coordination: Secondary | ICD-10-CM | POA: Diagnosis not present

## 2014-12-25 DIAGNOSIS — Z8673 Personal history of transient ischemic attack (TIA), and cerebral infarction without residual deficits: Secondary | ICD-10-CM | POA: Diagnosis not present

## 2014-12-25 DIAGNOSIS — M6281 Muscle weakness (generalized): Secondary | ICD-10-CM | POA: Diagnosis not present

## 2014-12-25 DIAGNOSIS — I5030 Unspecified diastolic (congestive) heart failure: Secondary | ICD-10-CM | POA: Diagnosis not present

## 2014-12-25 DIAGNOSIS — I739 Peripheral vascular disease, unspecified: Secondary | ICD-10-CM | POA: Diagnosis not present

## 2014-12-25 DIAGNOSIS — F4310 Post-traumatic stress disorder, unspecified: Secondary | ICD-10-CM | POA: Diagnosis not present

## 2014-12-25 DIAGNOSIS — N183 Chronic kidney disease, stage 3 (moderate): Secondary | ICD-10-CM | POA: Diagnosis not present

## 2014-12-25 DIAGNOSIS — G629 Polyneuropathy, unspecified: Secondary | ICD-10-CM | POA: Diagnosis not present

## 2014-12-25 DIAGNOSIS — R42 Dizziness and giddiness: Secondary | ICD-10-CM | POA: Diagnosis not present

## 2014-12-25 DIAGNOSIS — I509 Heart failure, unspecified: Secondary | ICD-10-CM | POA: Diagnosis not present

## 2014-12-25 DIAGNOSIS — M1990 Unspecified osteoarthritis, unspecified site: Secondary | ICD-10-CM | POA: Diagnosis not present

## 2014-12-25 DIAGNOSIS — I2510 Atherosclerotic heart disease of native coronary artery without angina pectoris: Secondary | ICD-10-CM | POA: Diagnosis not present

## 2014-12-25 DIAGNOSIS — F329 Major depressive disorder, single episode, unspecified: Secondary | ICD-10-CM | POA: Diagnosis not present

## 2014-12-25 DIAGNOSIS — I110 Hypertensive heart disease with heart failure: Secondary | ICD-10-CM | POA: Diagnosis not present

## 2014-12-25 DIAGNOSIS — I480 Paroxysmal atrial fibrillation: Secondary | ICD-10-CM | POA: Diagnosis not present

## 2014-12-25 DIAGNOSIS — R2681 Unsteadiness on feet: Secondary | ICD-10-CM | POA: Diagnosis not present

## 2014-12-25 NOTE — Telephone Encounter (Signed)
[place] [facility].   Reports that open area to foot was noted during PT. [facility] and tx as indicated.   Prescription faxed.

## 2014-12-28 DIAGNOSIS — R2681 Unsteadiness on feet: Secondary | ICD-10-CM | POA: Diagnosis not present

## 2014-12-28 DIAGNOSIS — M1990 Unspecified osteoarthritis, unspecified site: Secondary | ICD-10-CM | POA: Diagnosis not present

## 2014-12-28 DIAGNOSIS — I480 Paroxysmal atrial fibrillation: Secondary | ICD-10-CM | POA: Diagnosis not present

## 2014-12-28 DIAGNOSIS — G629 Polyneuropathy, unspecified: Secondary | ICD-10-CM | POA: Diagnosis not present

## 2014-12-28 DIAGNOSIS — R42 Dizziness and giddiness: Secondary | ICD-10-CM | POA: Diagnosis not present

## 2014-12-28 DIAGNOSIS — I2510 Atherosclerotic heart disease of native coronary artery without angina pectoris: Secondary | ICD-10-CM | POA: Diagnosis not present

## 2014-12-28 DIAGNOSIS — F4310 Post-traumatic stress disorder, unspecified: Secondary | ICD-10-CM | POA: Diagnosis not present

## 2014-12-28 DIAGNOSIS — N183 Chronic kidney disease, stage 3 (moderate): Secondary | ICD-10-CM | POA: Diagnosis not present

## 2014-12-28 DIAGNOSIS — F329 Major depressive disorder, single episode, unspecified: Secondary | ICD-10-CM | POA: Diagnosis not present

## 2014-12-28 DIAGNOSIS — I5030 Unspecified diastolic (congestive) heart failure: Secondary | ICD-10-CM | POA: Diagnosis not present

## 2014-12-28 DIAGNOSIS — I110 Hypertensive heart disease with heart failure: Secondary | ICD-10-CM | POA: Diagnosis not present

## 2014-12-28 DIAGNOSIS — Z8673 Personal history of transient ischemic attack (TIA), and cerebral infarction without residual deficits: Secondary | ICD-10-CM | POA: Diagnosis not present

## 2014-12-28 DIAGNOSIS — I739 Peripheral vascular disease, unspecified: Secondary | ICD-10-CM | POA: Diagnosis not present

## 2014-12-29 ENCOUNTER — Telehealth: Payer: Self-pay | Admitting: Family Medicine

## 2014-12-29 NOTE — Telephone Encounter (Signed)
Pt's PT, Marcelino DusterMichelle called for verbal orders from Dr. Tanya NonesPickard.  She started PT last week and would like to see him: 1x / week for 1 week 3x / week for 3 weeks To promote balance and walking. Please call Marcelino DusterMichelle @ [phone]

## 2014-12-30 DIAGNOSIS — I480 Paroxysmal atrial fibrillation: Secondary | ICD-10-CM | POA: Diagnosis not present

## 2014-12-30 DIAGNOSIS — I110 Hypertensive heart disease with heart failure: Secondary | ICD-10-CM | POA: Diagnosis not present

## 2014-12-30 DIAGNOSIS — Z8673 Personal history of transient ischemic attack (TIA), and cerebral infarction without residual deficits: Secondary | ICD-10-CM | POA: Diagnosis not present

## 2014-12-30 DIAGNOSIS — F329 Major depressive disorder, single episode, unspecified: Secondary | ICD-10-CM | POA: Diagnosis not present

## 2014-12-30 DIAGNOSIS — I5030 Unspecified diastolic (congestive) heart failure: Secondary | ICD-10-CM | POA: Diagnosis not present

## 2014-12-30 DIAGNOSIS — I2510 Atherosclerotic heart disease of native coronary artery without angina pectoris: Secondary | ICD-10-CM | POA: Diagnosis not present

## 2014-12-30 DIAGNOSIS — R2681 Unsteadiness on feet: Secondary | ICD-10-CM | POA: Diagnosis not present

## 2014-12-30 DIAGNOSIS — M1990 Unspecified osteoarthritis, unspecified site: Secondary | ICD-10-CM | POA: Diagnosis not present

## 2014-12-30 DIAGNOSIS — I739 Peripheral vascular disease, unspecified: Secondary | ICD-10-CM | POA: Diagnosis not present

## 2014-12-30 DIAGNOSIS — R42 Dizziness and giddiness: Secondary | ICD-10-CM | POA: Diagnosis not present

## 2014-12-30 DIAGNOSIS — G629 Polyneuropathy, unspecified: Secondary | ICD-10-CM | POA: Diagnosis not present

## 2014-12-30 DIAGNOSIS — N183 Chronic kidney disease, stage 3 (moderate): Secondary | ICD-10-CM | POA: Diagnosis not present

## 2014-12-30 DIAGNOSIS — F4310 Post-traumatic stress disorder, unspecified: Secondary | ICD-10-CM | POA: Diagnosis not present

## 2014-12-30 NOTE — Telephone Encounter (Signed)
Called and spoke to [place] and gave verbal orders

## 2014-12-31 ENCOUNTER — Ambulatory Visit (INDEPENDENT_AMBULATORY_CARE_PROVIDER_SITE_OTHER): Payer: Medicare Other | Admitting: Family Medicine

## 2014-12-31 ENCOUNTER — Encounter: Payer: Self-pay | Admitting: Family Medicine

## 2014-12-31 VITALS — BP 100/78 | HR 78 | Temp 97.9°F | Resp 20 | Ht 70.0 in | Wt 194.0 lb

## 2014-12-31 DIAGNOSIS — Z09 Encounter for follow-up examination after completed treatment for conditions other than malignant neoplasm: Secondary | ICD-10-CM

## 2014-12-31 NOTE — Progress Notes (Signed)
Subjective:    Patient ID: Jason [name], male    DOB: [birth year], 81 y.o.   MRN: [id number]  HPI  Patient was recently admitted to the hospital.  I have copied relevant portions of the discharge summary and included it below for my reference:  History of present illness:  Jason [name] is a 80 y.o. male coming from ALF with a PMHx of PTSD, PVD s/p L CEA in 2016, CVA, CAD s/p PCI in 2007, hx DVT, hiatal hernia, gout, and HTN who presented to ED with worsening vertigo and slurred speech. The patient was in his usual state of health until 1 day prior to presentation when he woke up with worsened vertigo than usual. He felt moderately dizzy all day, without relief.Today, he noticed that his speech was slurred and his left side of his mouth felt "heavy", so he called his daughter who brought him to the ED.  In the ED, the patient was noted to have slurred speech. A non-contrasted head CT was unremarkable but an MRI brain showed two new acute infarcts in R cerebellum and R white matter. [facility] admitted for new ischemic stroke.  Hospital Course by problem list:  Acute ischemic CVA: Presented to ED with left sided mouth weakness, worsened dizziness, and slurred speech. MRI brain revealed acute right centrum semi ovale infarct as well as a second infarct in right cerebellar hemisphere which explains these symptoms. Recent echocardiogram with preserved EF and no embolic foci, carotid doppler and LE venous US negative. Patient's home simvastatin was increased from 10 mg to 40 mg to lower LDL with goal of <70 and neurology has recommended addition of 81 mg ASA QD. Speech is now coherent with minimal to no slurring, and facial weakness has improved. Patient still complains of some dizziness with ambulation and will require close supervision for ambulation at SNF.   Paroxysmal Atrial Fibrillation: Rate controlled, sinus rhythm on telemetry. Continue home xarelto.  Chronic Diastolic CHF: Recent  echocardiogram 9/6 showed EF of 50-55% and grade 1 diastolic dysfunction. Patient is euvolemic and asymptomatic at discharge. Will restart home lasix with close follow up regarding volume status.  Acute on chronic kidney injury, CKD III: Patient's creatinine has returned to near baseline upon discharge with IVF repletion. Acute injury likely prerenal azotemia due to dehydration from poor PO intake and diuretics. Lasix and losartan to be restarted upon discharge with close outpatient follow up of fluid status and kidney function.  Arthritis: Stable, pain is chronic and unchanged during this hospitalization. Continue topical diclofenac and prednisone as before.  Hypertension: Home BP medications were held during this hospitalization to allow permissive HTN. These medications will be restarted upon discharge with close monitoring of BPs as outpatient.  PTSD: Stable, continue home medication - duloxetine as before.  History of DVT: Stable, patient asymptomatic at this time. Repeat LE Venous US was negative during this hospitalization. Continue xarelto.       Patient is here today for follow-up. He is slightly off balance and walking more than his baseline. However his speech has returned to normal. His daughter states that later in the afternoon his speech will deteriorate. However the biggest sequela of a stroke at the present time is unsteady gait and disequilibrium. Carotid Doppler showed no embolic source for stroke. Patient had an echocardiogram in September that was negative for embolic sources of stroke. At the hospital they added an aspirin along with increasing simvastatin. He will be due to recheck a fasting lipid panel  in 3 months. Past Medical History  Diagnosis Date  . PVD (peripheral vascular disease) (HCC)   . CAD (coronary artery disease)     a. 12/2005 - PCI to OM  . Hyperlipidemia   . Cerebrovascular accident (HCC)   . DVT femoral (deep venous thrombosis) with thrombophlebitis  (HCC) 03/2007    [name] 07/13/2010  . GERD (gastroesophageal reflux disease)   . Edema   . Vertigo   . IBS (irritable bowel syndrome)   . Duodenitis   . Gastritis   . Esophageal stricture   . Cellulitis   . Pulmonary embolism (HCC)   . Prostatic hypertrophy   . Frequent falls   . Subdural hematoma (HCC)   . PTSD (post-traumatic stress disorder)   . Vertigo   . TIA (transient ischemic attack)     a. 01/2006 and 05/2006.   [name] Hiatal hernia     periferal neuropathy  . Hx of echocardiogram     Echo 9/13:  Mild LVH, EF 60-65%, Gr 1 diast dysfn, mild AI, mild BAE  . Gout   . Chronic low back pain   . Gait disorder   . Peripheral neuropathy (HCC)   . Hydrocele   . History of shingles     Left lumbar  . HOH (hard of hearing)     hearing aid, totally in R ear ( no hearing aid in L )   . Myocardial infarction [facility]) "years ago"    "dr said I'd had a small one"  . Daily headache   . Depression   . Urinary frequency   . Blind left eye   . Hypertension   . Dysrhythmia   . Arthritis   . Anxiety     PTSD   Past Surgical History  Procedure Laterality Date  . Hand surgery Right     "replaced bone in my finger"  . Angioplasty    . Carotid endarterectomy Left   . Coronary angioplasty with stent placement      left circumflex  . Cataract extraction, bilateral    . Tonsillectomy    . Tympanic membrane repair Right 1978    [name] 07/27/2010; "couldn'getting so t couldn't hear; had noise in my ear; didn't correct either  . Hand contracture release Right     palm/notes 07/27/2010  . Esophagogastroduodenoscopy (egd) with esophageal dilation    . Insertion of vena cava filter  2013    h/o PE  . Endarterectomy Left 11/02/2014    Procedure: LEFT  CAROTID ARTERY ENDARTERECTOMY  WITH DACRON PATCH ANGIOPLASTY;  Surgeon: Sherren Kerns, MD;  Location: [facility] OR;  Service: Vascular;  Laterality: Left;   Current Outpatient Prescriptions on File Prior to Visit  Medication Sig Dispense Refill  .  acetaminophen (TYLENOL) 500 MG tablet Take 1,000 mg by mouth 2 (two) times daily. Take every day per [facility]    . ALPRAZolam (XANAX) 0.25 MG tablet Take 1 tablet (0.25 mg total) by mouth 2 (two) times daily as needed for anxiety. 10 tablet 0  . alum & mag hydroxide-simeth (MAALOX/MYLANTA) 200-200-20 MG/5ML suspension Take 30 mLs by mouth every 6 (six) hours as needed for indigestion or heartburn.    [name] aspirin EC 81 MG EC tablet Take 1 tablet (81 mg total) by mouth daily.    [name] DEXILANT 60 MG capsule TAKE ONE CAPSULE BY MOUTH EVERY DAY 30 capsule 11  . diclofenac sodium (VOLTAREN) 1 % GEL Apply 2 g topically 4 (four) times daily. 100 g 5  .  docusate sodium (COLACE) 100 MG capsule Take 1 capsule (100 mg total) by mouth at bedtime. 30 capsule 11  . DULoxetine (CYMBALTA) 30 MG capsule TAKE ONE CAPSULE BY MOUTH EVERY DAY 30 capsule 6  . furosemide (LASIX) 40 MG tablet Take 40 mg by mouth daily.    [name] HYDROcodone-acetaminophen (NORCO) 5-325 MG tablet Take 1 tablet by mouth every 6 (six) hours as needed for moderate pain or severe pain. 20 tablet 0  . latanoprost (XALATAN) 0.005 % ophthalmic solution Place 1 drop into both eyes at bedtime.    [name] loperamide (IMODIUM) 2 MG capsule Take 2 mg by mouth as needed for diarrhea or loose stools (Not to exceed 8 doses in 24 hours).    . losartan (COZAAR) 25 MG tablet Take 25 mg by mouth daily.    . magnesium hydroxide (MILK OF MAGNESIA) 400 MG/5ML suspension Take 30 mLs by mouth at bedtime as needed for mild constipation.    . meclizine (ANTIVERT) 12.5 MG tablet Take 12.5 mg by mouth every 12 (twelve) hours as needed for dizziness.    . mupirocin ointment (BACTROBAN) 2 % 1 Application, nasally, 2 times daily for 5 days prior to surgery 22 g 0  . MYRBETRIQ 25 MG TB24 tablet TAKE 1 TABLET BY MOUTH EVERY DAY 30 tablet 11  . nitroGLYCERIN (NITROSTAT) 0.4 MG SL tablet Place 0.4 mg under the tongue every 5 (five) minutes as needed. Chest pain    . ondansetron (ZOFRAN) 4 MG  tablet Take 4 mg by mouth every 8 (eight) hours as needed for nausea.    . polyethylene glycol powder (GLYCOLAX/MIRALAX) powder MIX 17 GRAMS WITH 4 TO 6 OUNCES OF WATER AND DRINK ONCE DAILY AS NEEDED FOR CONSTIPATION 527 g 2  . polyvinyl alcohol (LIQUIFILM TEARS) 1.4 % ophthalmic solution Place 2 drops into both eyes 3 (three) times daily.    . predniSONE (DELTASONE) 10 MG tablet Take 1 tablet (10 mg total) by mouth daily with breakfast. 30 tablet 5  . simvastatin (ZOCOR) 40 MG tablet Take 1 tablet (40 mg total) by mouth daily at 6 PM.    . tamsulosin (FLOMAX) 0.4 MG CAPS capsule Take 0.4 mg by mouth daily.    [name] 15 MG TABS tablet TAKE 1 TABLET BY MOUTH EVERY DAY WITH SUPPER 30 tablet 6   No current facility-administered medications on file prior to visit.   Allergies  Allergen Reactions  . Ativan [Lorazepam] Other (See Comments)    unknown  . Augmentin [Amoxicillin-Pot Clavulanate] Nausea And Vomiting    daughter states he can tolerate Amoxicillin ok Profuse vomiting  . Fludrocortisone Acetate Other (See Comments)    "leg swelling"  . Scopace [Scopolamine] Other (See Comments)    *patch "Made him crazy" per patients daughter    Social History   Social History  . Marital Status: Married    Spouse Name: N/A  . Number of Children: 2  . Years of Education: HS   Occupational History  . retired    Social History Main Topics  . Smoking status: Never Smoker   . Smokeless tobacco: Never Used  . Alcohol Use: 0.0 oz/week    0 Standard drinks or equivalent per week     Comment: wine occasionally  . Drug Use: No  . Sexual Activity: No   Other Topics Concern  . 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   Patient drinks about 2 cups of caffeine daily.    Review of Systems  All other systems reviewed and are negative.      Objective:   Physical Exam  Constitutional: He appears  well-developed and well-nourished.  Neck: Neck supple.  Cardiovascular: Normal rate and normal heart sounds.   Pulmonary/Chest: Effort normal and breath sounds normal. No respiratory distress. He has no wheezes. He has no rales.  Abdominal: Soft. Bowel sounds are normal. He exhibits no distension. There is no tenderness. There is no rebound and no guarding.  Musculoskeletal: He exhibits no edema.  Vitals reviewed.         Assessment & Plan:  Hospital discharge follow-up - Plan: BASIC METABOLIC PANEL WITH GFR  Patient was dehydrated when he went to the hospital. He is slightly fluid overloaded today on exam. I will monitor his renal function. He will continue to use Lasix 40 mg by mouth daily every day unless his weight falls below 190 pounds. I believe 190 pounds his good dry weight. I will recheck a fasting lipid panel in 3 months.

## 2015-01-01 DIAGNOSIS — G629 Polyneuropathy, unspecified: Secondary | ICD-10-CM | POA: Diagnosis not present

## 2015-01-01 DIAGNOSIS — Z8673 Personal history of transient ischemic attack (TIA), and cerebral infarction without residual deficits: Secondary | ICD-10-CM | POA: Diagnosis not present

## 2015-01-01 DIAGNOSIS — I480 Paroxysmal atrial fibrillation: Secondary | ICD-10-CM | POA: Diagnosis not present

## 2015-01-01 DIAGNOSIS — I110 Hypertensive heart disease with heart failure: Secondary | ICD-10-CM | POA: Diagnosis not present

## 2015-01-01 DIAGNOSIS — M1990 Unspecified osteoarthritis, unspecified site: Secondary | ICD-10-CM | POA: Diagnosis not present

## 2015-01-01 DIAGNOSIS — I739 Peripheral vascular disease, unspecified: Secondary | ICD-10-CM | POA: Diagnosis not present

## 2015-01-01 DIAGNOSIS — I2510 Atherosclerotic heart disease of native coronary artery without angina pectoris: Secondary | ICD-10-CM | POA: Diagnosis not present

## 2015-01-01 DIAGNOSIS — R42 Dizziness and giddiness: Secondary | ICD-10-CM | POA: Diagnosis not present

## 2015-01-01 DIAGNOSIS — F4310 Post-traumatic stress disorder, unspecified: Secondary | ICD-10-CM | POA: Diagnosis not present

## 2015-01-01 DIAGNOSIS — F329 Major depressive disorder, single episode, unspecified: Secondary | ICD-10-CM | POA: Diagnosis not present

## 2015-01-01 DIAGNOSIS — R2681 Unsteadiness on feet: Secondary | ICD-10-CM | POA: Diagnosis not present

## 2015-01-01 DIAGNOSIS — N183 Chronic kidney disease, stage 3 (moderate): Secondary | ICD-10-CM | POA: Diagnosis not present

## 2015-01-01 DIAGNOSIS — I5030 Unspecified diastolic (congestive) heart failure: Secondary | ICD-10-CM | POA: Diagnosis not present

## 2015-01-01 LAB — BASIC METABOLIC PANEL WITH GFR
BUN: 43 mg/dL — ABNORMAL HIGH (ref 7–25)
CO2: 28 mmol/L (ref 20–31)
Calcium: 9.2 mg/dL (ref 8.6–10.3)
Chloride: 97 mmol/L — ABNORMAL LOW (ref 98–110)
Creat: 2.2 mg/dL — ABNORMAL HIGH (ref 0.70–1.11)
GFR, EST AFRICAN AMERICAN: 29 mL/min — AB (ref >=60)
GFR, EST NON AFRICAN AMERICAN: 25 mL/min — AB (ref >=60)
Glucose, Bld: 102 mg/dL — ABNORMAL HIGH (ref 70–99)
POTASSIUM: 4.6 mmol/L (ref 3.5–5.3)
Sodium: 137 mmol/L (ref 135–146)

## 2015-01-04 DIAGNOSIS — G629 Polyneuropathy, unspecified: Secondary | ICD-10-CM | POA: Diagnosis not present

## 2015-01-04 DIAGNOSIS — I110 Hypertensive heart disease with heart failure: Secondary | ICD-10-CM | POA: Diagnosis not present

## 2015-01-04 DIAGNOSIS — N183 Chronic kidney disease, stage 3 (moderate): Secondary | ICD-10-CM | POA: Diagnosis not present

## 2015-01-04 DIAGNOSIS — I5030 Unspecified diastolic (congestive) heart failure: Secondary | ICD-10-CM | POA: Diagnosis not present

## 2015-01-04 DIAGNOSIS — R42 Dizziness and giddiness: Secondary | ICD-10-CM | POA: Diagnosis not present

## 2015-01-04 DIAGNOSIS — Z8673 Personal history of transient ischemic attack (TIA), and cerebral infarction without residual deficits: Secondary | ICD-10-CM | POA: Diagnosis not present

## 2015-01-04 DIAGNOSIS — I739 Peripheral vascular disease, unspecified: Secondary | ICD-10-CM | POA: Diagnosis not present

## 2015-01-04 DIAGNOSIS — F329 Major depressive disorder, single episode, unspecified: Secondary | ICD-10-CM | POA: Diagnosis not present

## 2015-01-04 DIAGNOSIS — M1990 Unspecified osteoarthritis, unspecified site: Secondary | ICD-10-CM | POA: Diagnosis not present

## 2015-01-04 DIAGNOSIS — I480 Paroxysmal atrial fibrillation: Secondary | ICD-10-CM | POA: Diagnosis not present

## 2015-01-04 DIAGNOSIS — F4310 Post-traumatic stress disorder, unspecified: Secondary | ICD-10-CM | POA: Diagnosis not present

## 2015-01-04 DIAGNOSIS — R2681 Unsteadiness on feet: Secondary | ICD-10-CM | POA: Diagnosis not present

## 2015-01-04 DIAGNOSIS — I2510 Atherosclerotic heart disease of native coronary artery without angina pectoris: Secondary | ICD-10-CM | POA: Diagnosis not present

## 2015-01-05 DIAGNOSIS — I5030 Unspecified diastolic (congestive) heart failure: Secondary | ICD-10-CM | POA: Diagnosis not present

## 2015-01-05 DIAGNOSIS — R2681 Unsteadiness on feet: Secondary | ICD-10-CM | POA: Diagnosis not present

## 2015-01-05 DIAGNOSIS — I110 Hypertensive heart disease with heart failure: Secondary | ICD-10-CM | POA: Diagnosis not present

## 2015-01-05 DIAGNOSIS — R42 Dizziness and giddiness: Secondary | ICD-10-CM | POA: Diagnosis not present

## 2015-01-05 DIAGNOSIS — M1990 Unspecified osteoarthritis, unspecified site: Secondary | ICD-10-CM | POA: Diagnosis not present

## 2015-01-05 DIAGNOSIS — F329 Major depressive disorder, single episode, unspecified: Secondary | ICD-10-CM | POA: Diagnosis not present

## 2015-01-05 DIAGNOSIS — G629 Polyneuropathy, unspecified: Secondary | ICD-10-CM | POA: Diagnosis not present

## 2015-01-05 DIAGNOSIS — N183 Chronic kidney disease, stage 3 (moderate): Secondary | ICD-10-CM | POA: Diagnosis not present

## 2015-01-05 DIAGNOSIS — Z8673 Personal history of transient ischemic attack (TIA), and cerebral infarction without residual deficits: Secondary | ICD-10-CM | POA: Diagnosis not present

## 2015-01-05 DIAGNOSIS — I2510 Atherosclerotic heart disease of native coronary artery without angina pectoris: Secondary | ICD-10-CM | POA: Diagnosis not present

## 2015-01-05 DIAGNOSIS — I480 Paroxysmal atrial fibrillation: Secondary | ICD-10-CM | POA: Diagnosis not present

## 2015-01-05 DIAGNOSIS — I739 Peripheral vascular disease, unspecified: Secondary | ICD-10-CM | POA: Diagnosis not present

## 2015-01-05 DIAGNOSIS — F4310 Post-traumatic stress disorder, unspecified: Secondary | ICD-10-CM | POA: Diagnosis not present

## 2015-01-07 ENCOUNTER — Other Ambulatory Visit: Payer: Medicare Other

## 2015-01-07 DIAGNOSIS — F329 Major depressive disorder, single episode, unspecified: Secondary | ICD-10-CM | POA: Diagnosis not present

## 2015-01-07 DIAGNOSIS — R42 Dizziness and giddiness: Secondary | ICD-10-CM | POA: Diagnosis not present

## 2015-01-07 DIAGNOSIS — I739 Peripheral vascular disease, unspecified: Secondary | ICD-10-CM | POA: Diagnosis not present

## 2015-01-07 DIAGNOSIS — I480 Paroxysmal atrial fibrillation: Secondary | ICD-10-CM | POA: Diagnosis not present

## 2015-01-07 DIAGNOSIS — I5030 Unspecified diastolic (congestive) heart failure: Secondary | ICD-10-CM | POA: Diagnosis not present

## 2015-01-07 DIAGNOSIS — N289 Disorder of kidney and ureter, unspecified: Secondary | ICD-10-CM

## 2015-01-07 DIAGNOSIS — F4310 Post-traumatic stress disorder, unspecified: Secondary | ICD-10-CM | POA: Diagnosis not present

## 2015-01-07 DIAGNOSIS — G629 Polyneuropathy, unspecified: Secondary | ICD-10-CM | POA: Diagnosis not present

## 2015-01-07 DIAGNOSIS — I2510 Atherosclerotic heart disease of native coronary artery without angina pectoris: Secondary | ICD-10-CM | POA: Diagnosis not present

## 2015-01-07 DIAGNOSIS — Z8673 Personal history of transient ischemic attack (TIA), and cerebral infarction without residual deficits: Secondary | ICD-10-CM | POA: Diagnosis not present

## 2015-01-07 DIAGNOSIS — N183 Chronic kidney disease, stage 3 (moderate): Secondary | ICD-10-CM | POA: Diagnosis not present

## 2015-01-07 DIAGNOSIS — M1990 Unspecified osteoarthritis, unspecified site: Secondary | ICD-10-CM | POA: Diagnosis not present

## 2015-01-07 DIAGNOSIS — I110 Hypertensive heart disease with heart failure: Secondary | ICD-10-CM | POA: Diagnosis not present

## 2015-01-07 DIAGNOSIS — R2681 Unsteadiness on feet: Secondary | ICD-10-CM | POA: Diagnosis not present

## 2015-01-08 LAB — BASIC METABOLIC PANEL
BUN: 40 mg/dL — AB (ref 7–25)
CO2: 30 mmol/L (ref 20–31)
CREATININE: 1.48 mg/dL — AB (ref 0.70–1.11)
Calcium: 8.6 mg/dL (ref 8.6–10.3)
Chloride: 101 mmol/L (ref 98–110)
Glucose, Bld: 107 mg/dL — ABNORMAL HIGH (ref 70–99)
POTASSIUM: 4.5 mmol/L (ref 3.5–5.3)
Sodium: 138 mmol/L (ref 135–146)

## 2015-01-12 ENCOUNTER — Telehealth: Payer: Self-pay | Admitting: Family Medicine

## 2015-01-12 DIAGNOSIS — R42 Dizziness and giddiness: Secondary | ICD-10-CM | POA: Diagnosis not present

## 2015-01-12 DIAGNOSIS — I5030 Unspecified diastolic (congestive) heart failure: Secondary | ICD-10-CM | POA: Diagnosis not present

## 2015-01-12 DIAGNOSIS — I2510 Atherosclerotic heart disease of native coronary artery without angina pectoris: Secondary | ICD-10-CM | POA: Diagnosis not present

## 2015-01-12 DIAGNOSIS — F4310 Post-traumatic stress disorder, unspecified: Secondary | ICD-10-CM | POA: Diagnosis not present

## 2015-01-12 DIAGNOSIS — I480 Paroxysmal atrial fibrillation: Secondary | ICD-10-CM | POA: Diagnosis not present

## 2015-01-12 DIAGNOSIS — Z8673 Personal history of transient ischemic attack (TIA), and cerebral infarction without residual deficits: Secondary | ICD-10-CM | POA: Diagnosis not present

## 2015-01-12 DIAGNOSIS — I110 Hypertensive heart disease with heart failure: Secondary | ICD-10-CM | POA: Diagnosis not present

## 2015-01-12 DIAGNOSIS — R2681 Unsteadiness on feet: Secondary | ICD-10-CM | POA: Diagnosis not present

## 2015-01-12 DIAGNOSIS — M1990 Unspecified osteoarthritis, unspecified site: Secondary | ICD-10-CM | POA: Diagnosis not present

## 2015-01-12 DIAGNOSIS — N183 Chronic kidney disease, stage 3 (moderate): Secondary | ICD-10-CM | POA: Diagnosis not present

## 2015-01-12 DIAGNOSIS — F329 Major depressive disorder, single episode, unspecified: Secondary | ICD-10-CM | POA: Diagnosis not present

## 2015-01-12 DIAGNOSIS — G629 Polyneuropathy, unspecified: Secondary | ICD-10-CM | POA: Diagnosis not present

## 2015-01-12 DIAGNOSIS — I739 Peripheral vascular disease, unspecified: Secondary | ICD-10-CM | POA: Diagnosis not present

## 2015-01-12 MED ORDER — NEBIVOLOL HCL 5 MG PO TABS: 5.0000 mg | ORAL_TABLET | Freq: Every day | ORAL | Status: DC

## 2015-01-12 NOTE — Telephone Encounter (Signed)
Pt's daughter called and states that pt's BP is elevated and he is having increased dizziness.   Per [facility] start Bystolic 5 mg qd and he believes his dizziness is age related and secondary to CVA's.   Pt's daughter aware [facility]

## 2015-01-13 DIAGNOSIS — I5030 Unspecified diastolic (congestive) heart failure: Secondary | ICD-10-CM | POA: Diagnosis not present

## 2015-01-13 DIAGNOSIS — F329 Major depressive disorder, single episode, unspecified: Secondary | ICD-10-CM | POA: Diagnosis not present

## 2015-01-13 DIAGNOSIS — Z8673 Personal history of transient ischemic attack (TIA), and cerebral infarction without residual deficits: Secondary | ICD-10-CM | POA: Diagnosis not present

## 2015-01-13 DIAGNOSIS — R42 Dizziness and giddiness: Secondary | ICD-10-CM | POA: Diagnosis not present

## 2015-01-13 DIAGNOSIS — N183 Chronic kidney disease, stage 3 (moderate): Secondary | ICD-10-CM | POA: Diagnosis not present

## 2015-01-13 DIAGNOSIS — R2681 Unsteadiness on feet: Secondary | ICD-10-CM | POA: Diagnosis not present

## 2015-01-13 DIAGNOSIS — I2510 Atherosclerotic heart disease of native coronary artery without angina pectoris: Secondary | ICD-10-CM | POA: Diagnosis not present

## 2015-01-13 DIAGNOSIS — M1990 Unspecified osteoarthritis, unspecified site: Secondary | ICD-10-CM | POA: Diagnosis not present

## 2015-01-13 DIAGNOSIS — I480 Paroxysmal atrial fibrillation: Secondary | ICD-10-CM | POA: Diagnosis not present

## 2015-01-13 DIAGNOSIS — F4310 Post-traumatic stress disorder, unspecified: Secondary | ICD-10-CM | POA: Diagnosis not present

## 2015-01-13 DIAGNOSIS — G629 Polyneuropathy, unspecified: Secondary | ICD-10-CM | POA: Diagnosis not present

## 2015-01-13 DIAGNOSIS — I110 Hypertensive heart disease with heart failure: Secondary | ICD-10-CM | POA: Diagnosis not present

## 2015-01-13 DIAGNOSIS — I739 Peripheral vascular disease, unspecified: Secondary | ICD-10-CM | POA: Diagnosis not present

## 2015-01-14 ENCOUNTER — Telehealth: Payer: Self-pay | Admitting: *Deleted

## 2015-01-14 DIAGNOSIS — I110 Hypertensive heart disease with heart failure: Secondary | ICD-10-CM | POA: Diagnosis not present

## 2015-01-14 DIAGNOSIS — I2510 Atherosclerotic heart disease of native coronary artery without angina pectoris: Secondary | ICD-10-CM | POA: Diagnosis not present

## 2015-01-14 DIAGNOSIS — R42 Dizziness and giddiness: Secondary | ICD-10-CM | POA: Diagnosis not present

## 2015-01-14 DIAGNOSIS — F4310 Post-traumatic stress disorder, unspecified: Secondary | ICD-10-CM | POA: Diagnosis not present

## 2015-01-14 DIAGNOSIS — G629 Polyneuropathy, unspecified: Secondary | ICD-10-CM | POA: Diagnosis not present

## 2015-01-14 DIAGNOSIS — I739 Peripheral vascular disease, unspecified: Secondary | ICD-10-CM | POA: Diagnosis not present

## 2015-01-14 DIAGNOSIS — M1990 Unspecified osteoarthritis, unspecified site: Secondary | ICD-10-CM | POA: Diagnosis not present

## 2015-01-14 DIAGNOSIS — Z8673 Personal history of transient ischemic attack (TIA), and cerebral infarction without residual deficits: Secondary | ICD-10-CM | POA: Diagnosis not present

## 2015-01-14 DIAGNOSIS — F329 Major depressive disorder, single episode, unspecified: Secondary | ICD-10-CM | POA: Diagnosis not present

## 2015-01-14 DIAGNOSIS — R2681 Unsteadiness on feet: Secondary | ICD-10-CM | POA: Diagnosis not present

## 2015-01-14 DIAGNOSIS — N183 Chronic kidney disease, stage 3 (moderate): Secondary | ICD-10-CM | POA: Diagnosis not present

## 2015-01-14 DIAGNOSIS — I480 Paroxysmal atrial fibrillation: Secondary | ICD-10-CM | POA: Diagnosis not present

## 2015-01-14 DIAGNOSIS — I5030 Unspecified diastolic (congestive) heart failure: Secondary | ICD-10-CM | POA: Diagnosis not present

## 2015-01-14 NOTE — Telephone Encounter (Signed)
Received faxed documentation of patient BP.   MD reviewed and states that BP remand elevated, but Bystolic was just begun.   Continue all current meds and BP check. Review BP Log in 2 weeks.   Order sent to ALF.

## 2015-01-15 ENCOUNTER — Encounter: Payer: Self-pay | Admitting: Family Medicine

## 2015-01-15 ENCOUNTER — Ambulatory Visit (INDEPENDENT_AMBULATORY_CARE_PROVIDER_SITE_OTHER): Payer: Medicare Other | Admitting: Family Medicine

## 2015-01-15 VITALS — BP 146/80 | HR 60 | Temp 98.0°F | Resp 18 | Wt 195.0 lb

## 2015-01-15 DIAGNOSIS — R42 Dizziness and giddiness: Secondary | ICD-10-CM

## 2015-01-15 DIAGNOSIS — Z0000 Encounter for general adult medical examination without abnormal findings: Secondary | ICD-10-CM | POA: Diagnosis not present

## 2015-01-15 DIAGNOSIS — I10 Essential (primary) hypertension: Secondary | ICD-10-CM

## 2015-01-15 DIAGNOSIS — Z8673 Personal history of transient ischemic attack (TIA), and cerebral infarction without residual deficits: Secondary | ICD-10-CM | POA: Diagnosis not present

## 2015-01-15 DIAGNOSIS — I480 Paroxysmal atrial fibrillation: Secondary | ICD-10-CM

## 2015-01-15 DIAGNOSIS — E785 Hyperlipidemia, unspecified: Secondary | ICD-10-CM | POA: Diagnosis not present

## 2015-01-15 NOTE — Progress Notes (Signed)
Subjective:    Patient ID: Jason [name], male    DOB: October 05, 1919, 81 y.o.   MRN: [id number]  HPI  12/31/14 Patient was recently admitted to the hospital.  I have copied relevant portions of the discharge summary and included it below for my reference:  History of present illness:  Jason [name] is a 81 y.o. male coming from ALF with a PMHx of PTSD, PVD s/p L CEA in 2016, CVA, CAD s/p PCI in 2007, hx DVT, hiatal hernia, gout, and HTN who presented to ED with worsening vertigo and slurred speech. The patient was in his usual state of health until 1 day prior to presentation when he woke up with worsened vertigo than usual. He felt moderately dizzy all day, without relief.Today, he noticed that his speech was slurred and his left side of his mouth felt "heavy", so he called his daughter who brought him to the ED.  In the ED, the patient was noted to have slurred speech. A non-contrasted head CT was unremarkable but an MRI brain showed two new acute infarcts in R cerebellum and R white matter. [facility] admitted for new ischemic stroke.  Hospital Course by problem list:  Acute ischemic CVA: Presented to ED with left sided mouth weakness, worsened dizziness, and slurred speech. MRI brain revealed acute right centrum semi ovale infarct as well as a second infarct in right cerebellar hemisphere which explains these symptoms. Recent echocardiogram with preserved EF and no embolic foci, carotid doppler and LE venous US negative. Patient's home simvastatin was increased from 10 mg to 40 mg to lower LDL with goal of <70 and neurology has recommended addition of 81 mg ASA QD. Speech is now coherent with minimal to no slurring, and facial weakness has improved. Patient still complains of some dizziness with ambulation and will require close supervision for ambulation at SNF.   Paroxysmal Atrial Fibrillation: Rate controlled, sinus rhythm on telemetry. Continue home xarelto.  Chronic Diastolic CHF: Recent  echocardiogram 9/6 showed EF of 50-55% and grade 1 diastolic dysfunction. Patient is euvolemic and asymptomatic at discharge. Will restart home lasix with close follow up regarding volume status.  Acute on chronic kidney injury, CKD III: Patient's creatinine has returned to near baseline upon discharge with IVF repletion. Acute injury likely prerenal azotemia due to dehydration from poor PO intake and diuretics. Lasix and losartan to be restarted upon discharge with close outpatient follow up of fluid status and kidney function.  Arthritis: Stable, pain is chronic and unchanged during this hospitalization. Continue topical diclofenac and prednisone as before.  Hypertension: Home BP medications were held during this hospitalization to allow permissive HTN. These medications will be restarted upon discharge with close monitoring of BPs as outpatient.  PTSD: Stable, continue home medication - duloxetine as before.  History of DVT: Stable, patient asymptomatic at this time. Repeat LE Venous US was negative during this hospitalization. Continue xarelto.       Patient is here today for follow-up. He is slightly off balance and walking more than his baseline. However his speech has returned to normal. His daughter states that later in the afternoon his speech will deteriorate. However the biggest sequela of a stroke at the present time is unsteady gait and disequilibrium. Carotid Doppler showed no embolic source for stroke. Patient had an echocardiogram in September that was negative for embolic sources of stroke. At the hospital they added an aspirin along with increasing simvastatin. He will be due to recheck a fasting lipid  panel in 3 months.  At that time, my plan was: Patient was dehydrated when he went to the hospital. He is slightly fluid overloaded today on exam. I will monitor his renal function. He will continue to use Lasix 40 mg by mouth daily every day unless his weight falls below 190 pounds.  I believe 190 pounds his good dry weight. I will recheck a fasting lipid panel in 3 months.  01/15/15 I rechecked his renal function and his creatinine had risen again and therefore I discontinued the losartan. I then received word that his blood pressure was running extremely high at the nursing home in the 180s to 190s over 90-100. Therefore I started the patient on diastolic 5 mg by mouth daily. He is here today scheduled for complete physical exam. However at the age of 66, I would not recommend a prostate exam or prostate cancer screening nor I recommend a screening colonoscopy.[name]  His immunizations are well up-to-date including the shingles shot in 2013, Pneumovax 23 in the remote past, Prevnar 13 last year. He is due for an anal flu shot. He is not due for fasting lab work until February 2017 to recheck his cholesterol on the higher dose of statin medication. Wt Readings from Last 3 Encounters:  01/15/15 195 lb (88.451 kg)  12/31/14 194 lb (87.998 kg)  12/22/14 191 lb 8 oz (86.864 kg)   He notes his weight is 1 pound heavier than it was last time, there is no pitting edema in his lungs are clear.  Blood pressures at the nursing home have been extremely high and fluctuating in the 180s systolic over 90s. Here I checked his blood pressure today and found his systolic blood pressure in his left arm to be 146/80. I found his blood pressure his right arm to be 140/80. Given his age, I am very comfortable with his blood pressure Past Medical History  Diagnosis Date  . PVD (peripheral vascular disease) (HCC)   . CAD (coronary artery disease)     a. 12/2005 - PCI to OM  . Hyperlipidemia   . Cerebrovascular accident (HCC)   . 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 GERD (gastroesophageal reflux disease)   . Edema   . Vertigo   . IBS (irritable bowel syndrome)   . Duodenitis   . Gastritis   . Esophageal stricture   . Cellulitis   . Pulmonary  embolism (HCC)   . Prostatic hypertrophy   . Frequent falls   . Subdural hematoma (HCC)   . PTSD (post-traumatic stress disorder)   . Vertigo   . TIA (transient ischemic attack)     a. 01/2006 and 05/2006.   [name] Hiatal hernia     periferal neuropathy  . Hx of echocardiogram     Echo 9/13:  Mild LVH, EF 60-65%, Gr 1 diast dysfn, mild AI, mild BAE  . Gout   . Chronic low back pain   . Gait disorder   . Peripheral neuropathy (HCC)   . Hydrocele   . History of shingles     Left lumbar  . HOH (hard of hearing)     hearing aid, totally in R ear ( no hearing aid in L )   . Myocardial infarction [facility]) "years ago"    "dr said I'd had a small one"  . Daily headache   . Depression   . Urinary frequency   . Blind left eye   .  Hypertension   . Dysrhythmia   . Arthritis   . Anxiety     PTSD   Past Surgical History  Procedure Laterality Date  . Hand surgery Right     "replaced bone in my finger"  . Angioplasty    . Carotid endarterectomy Left   . Coronary angioplasty with stent placement      left circumflex  . Cataract extraction, bilateral    . Tonsillectomy    . Tympanic membrane repair Right 1978    [name] 07/27/2010; "couldn'getting so t couldn't hear; had noise in my ear; didn't correct either  . Hand contracture release Right     palm/notes 07/27/2010  . Esophagogastroduodenoscopy (egd) with esophageal dilation    . Insertion of vena cava filter  2013    h/o PE  . Endarterectomy Left 11/02/2014    Procedure: LEFT  CAROTID ARTERY ENDARTERECTOMY  WITH DACRON PATCH ANGIOPLASTY;  Surgeon: Sherren Kerns, MD;  Location: [facility];  Service: Vascular;  Laterality: Left;   Current Outpatient Prescriptions on File Prior to Visit  Medication Sig Dispense Refill  . acetaminophen (TYLENOL) 500 MG tablet Take 1,000 mg by mouth 2 (two) times daily. Take every day per [facility]    . ALPRAZolam (XANAX) 0.25 MG tablet Take 1 tablet (0.25 mg total) by mouth 2 (two) times daily as needed for anxiety.  10 tablet 0  . alum & mag hydroxide-simeth (MAALOX/MYLANTA) 200-200-20 MG/5ML suspension Take 30 mLs by mouth every 6 (six) hours as needed for indigestion or heartburn.    [name] aspirin EC 81 MG EC tablet Take 1 tablet (81 mg total) by mouth daily.    [name] DEXILANT 60 MG capsule TAKE ONE CAPSULE BY MOUTH EVERY DAY 30 capsule 11  . diclofenac sodium (VOLTAREN) 1 % GEL Apply 2 g topically 4 (four) times daily. 100 g 5  . docusate sodium (COLACE) 100 MG capsule Take 1 capsule (100 mg total) by mouth at bedtime. 30 capsule 11  . DULoxetine (CYMBALTA) 30 MG capsule TAKE ONE CAPSULE BY MOUTH EVERY DAY 30 capsule 6  . furosemide (LASIX) 40 MG tablet Take 40 mg by mouth daily.    [name] HYDROcodone-acetaminophen (NORCO) 5-325 MG tablet Take 1 tablet by mouth every 6 (six) hours as needed for moderate pain or severe pain. 20 tablet 0  . latanoprost (XALATAN) 0.005 % ophthalmic solution Place 1 drop into both eyes at bedtime.    [name] loperamide (IMODIUM) 2 MG capsule Take 2 mg by mouth as needed for diarrhea or loose stools (Not to exceed 8 doses in 24 hours).    . magnesium hydroxide (MILK OF MAGNESIA) 400 MG/5ML suspension Take 30 mLs by mouth at bedtime as needed for mild constipation.    . meclizine (ANTIVERT) 12.5 MG tablet Take 12.5 mg by mouth every 12 (twelve) hours as needed for dizziness.    [name] MYRBETRIQ 25 MG TB24 tablet TAKE 1 TABLET BY MOUTH EVERY DAY 30 tablet 11  . nebivolol (BYSTOLIC) 5 MG tablet Take 1 tablet (5 mg total) by mouth daily. 30 tablet 5  . nitroGLYCERIN (NITROSTAT) 0.4 MG SL tablet Place 0.4 mg under the tongue every 5 (five) minutes as needed. Chest pain    . ondansetron (ZOFRAN) 4 MG tablet Take 4 mg by mouth every 8 (eight) hours as needed for nausea.    . polyethylene glycol powder (GLYCOLAX/MIRALAX) powder MIX 17 GRAMS WITH 4 TO 6 OUNCES OF WATER AND DRINK ONCE DAILY AS NEEDED FOR CONSTIPATION 527  g 2  . polyvinyl alcohol (LIQUIFILM TEARS) 1.4 % ophthalmic solution Place 2 drops into both  eyes 3 (three) times daily.    . predniSONE (DELTASONE) 10 MG tablet Take 1 tablet (10 mg total) by mouth daily with breakfast. 30 tablet 5  . simvastatin (ZOCOR) 40 MG tablet Take 1 tablet (40 mg total) by mouth daily at 6 PM.    . tamsulosin (FLOMAX) 0.4 MG CAPS capsule Take 0.4 mg by mouth daily.    [name]. XARELTO 15 MG TABS tablet TAKE 1 TABLET BY MOUTH EVERY DAY WITH SUPPER 30 tablet 6   No current facility-administered medications on file prior to visit.   Allergies  Allergen Reactions  . Ativan [Lorazepam] Other (See Comments)    unknown  . Augmentin [Amoxicillin-Pot Clavulanate] Nausea And Vomiting    daughter states he can tolerate Amoxicillin ok Profuse vomiting  . Fludrocortisone Acetate Other (See Comments)    "leg swelling"  . Scopace [Scopolamine] Other (See Comments)    *patch "Made him crazy" per patients daughter    Social History   Social History  . Marital Status: Married    Spouse Name: N/A  . Number of Children: 2  . Years of Education: HS   Occupational History  . retired    Social History Main Topics  . Smoking status: Never Smoker   . Smokeless tobacco: Never Used  . Alcohol Use: 0.0 oz/week    0 Standard drinks or equivalent per week     Comment: wine occasionally  . Drug Use: No  . Sexual Activity: No   Other Topics Concern  . 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   Patient drinks about 2 cups of caffeine daily.    Review of Systems  All other systems reviewed and are negative.      Objective:   Physical Exam  Constitutional: He is oriented to person, place, and time. He appears well-developed and well-nourished. No distress.  HENT:  Head: Normocephalic and atraumatic.  Right Ear: External ear normal.  Left Ear: External ear normal.  Nose: Nose normal.  Mouth/Throat: Oropharynx is clear and moist. No oropharyngeal exudate.  Eyes:  Conjunctivae and EOM are normal. Pupils are equal, round, and reactive to light. Right eye exhibits no discharge. Left eye exhibits no discharge. No scleral icterus.  Neck: Normal range of motion. Neck supple. No JVD present. No tracheal deviation present. No thyromegaly present.  Cardiovascular: Normal rate and normal heart sounds.  Exam reveals no gallop and no friction rub.   No murmur heard. Pulmonary/Chest: Effort normal and breath sounds normal. No stridor. No respiratory distress. He has no wheezes. He has no rales.  Abdominal: Soft. Bowel sounds are normal. He exhibits no distension. There is no tenderness. There is no rebound and no guarding.  Musculoskeletal: Normal range of motion. He exhibits no edema or tenderness.  Lymphadenopathy:    He has no cervical adenopathy.  Neurological: He is alert and oriented to person, place, and time. He has normal reflexes. No cranial nerve deficit. He exhibits abnormal muscle tone. Coordination abnormal.  Skin: Skin is warm. No rash noted. He is not diaphoretic. No erythema. No pallor.  Psychiatric: He has a normal mood and affect. His behavior is normal. Judgment and thought content normal.  Vitals reviewed.  walks using a walker due to poor balance and generalized weakness and also  his recent CVA.        Assessment & Plan:  Routine general medical examination at a health care facility  Essential hypertension  Paroxysmal atrial fibrillation (HCC)  History of CVA (cerebrovascular accident)  HLD (hyperlipidemia)  Dizziness  Continued bystolic 5 mg a day. Blood pressure here today at the office by my check is extremely good. I want to avoid hypotension and therefore I will not treat his blood pressure more aggressively. He is due to recheck a fasting lipid panel in February. Otherwise his blood work is up-to-date. He is not due for any cancer screening. His immunizations are up-to-date.

## 2015-01-18 ENCOUNTER — Telehealth: Payer: Self-pay | Admitting: Family Medicine

## 2015-01-18 DIAGNOSIS — I739 Peripheral vascular disease, unspecified: Secondary | ICD-10-CM | POA: Diagnosis not present

## 2015-01-18 DIAGNOSIS — I480 Paroxysmal atrial fibrillation: Secondary | ICD-10-CM | POA: Diagnosis not present

## 2015-01-18 DIAGNOSIS — F4310 Post-traumatic stress disorder, unspecified: Secondary | ICD-10-CM | POA: Diagnosis not present

## 2015-01-18 DIAGNOSIS — F329 Major depressive disorder, single episode, unspecified: Secondary | ICD-10-CM | POA: Diagnosis not present

## 2015-01-18 DIAGNOSIS — N183 Chronic kidney disease, stage 3 (moderate): Secondary | ICD-10-CM | POA: Diagnosis not present

## 2015-01-18 DIAGNOSIS — R42 Dizziness and giddiness: Secondary | ICD-10-CM | POA: Diagnosis not present

## 2015-01-18 DIAGNOSIS — Z8673 Personal history of transient ischemic attack (TIA), and cerebral infarction without residual deficits: Secondary | ICD-10-CM | POA: Diagnosis not present

## 2015-01-18 DIAGNOSIS — I5030 Unspecified diastolic (congestive) heart failure: Secondary | ICD-10-CM | POA: Diagnosis not present

## 2015-01-18 DIAGNOSIS — R2681 Unsteadiness on feet: Secondary | ICD-10-CM | POA: Diagnosis not present

## 2015-01-18 DIAGNOSIS — M1990 Unspecified osteoarthritis, unspecified site: Secondary | ICD-10-CM | POA: Diagnosis not present

## 2015-01-18 DIAGNOSIS — I2510 Atherosclerotic heart disease of native coronary artery without angina pectoris: Secondary | ICD-10-CM | POA: Diagnosis not present

## 2015-01-18 DIAGNOSIS — G629 Polyneuropathy, unspecified: Secondary | ICD-10-CM | POA: Diagnosis not present

## 2015-01-18 DIAGNOSIS — I110 Hypertensive heart disease with heart failure: Secondary | ICD-10-CM | POA: Diagnosis not present

## 2015-01-18 NOTE — Telephone Encounter (Signed)
He is on maximum therapy for stroke prevention therefore, I doubt cva.  NTBS if worse to decide what the cause may be.

## 2015-01-18 NOTE — Telephone Encounter (Signed)
Pt's daughter, [name] called to let Dr. Tanya NonesPickard know that he is having trouble manipulating the fingers on his left hand. He is not having pain.  Please advise. [phone]

## 2015-01-18 NOTE — Telephone Encounter (Signed)
Pt daughter called back and aware of above message, states she is going to wait couple more days and if symptoms are better,if not then will call to schedule apptl

## 2015-01-20 DIAGNOSIS — I480 Paroxysmal atrial fibrillation: Secondary | ICD-10-CM | POA: Diagnosis not present

## 2015-01-20 DIAGNOSIS — R2681 Unsteadiness on feet: Secondary | ICD-10-CM | POA: Diagnosis not present

## 2015-01-20 DIAGNOSIS — F329 Major depressive disorder, single episode, unspecified: Secondary | ICD-10-CM | POA: Diagnosis not present

## 2015-01-20 DIAGNOSIS — I739 Peripheral vascular disease, unspecified: Secondary | ICD-10-CM | POA: Diagnosis not present

## 2015-01-20 DIAGNOSIS — G629 Polyneuropathy, unspecified: Secondary | ICD-10-CM | POA: Diagnosis not present

## 2015-01-20 DIAGNOSIS — I5030 Unspecified diastolic (congestive) heart failure: Secondary | ICD-10-CM | POA: Diagnosis not present

## 2015-01-20 DIAGNOSIS — N183 Chronic kidney disease, stage 3 (moderate): Secondary | ICD-10-CM | POA: Diagnosis not present

## 2015-01-20 DIAGNOSIS — M1990 Unspecified osteoarthritis, unspecified site: Secondary | ICD-10-CM | POA: Diagnosis not present

## 2015-01-20 DIAGNOSIS — I110 Hypertensive heart disease with heart failure: Secondary | ICD-10-CM | POA: Diagnosis not present

## 2015-01-20 DIAGNOSIS — R42 Dizziness and giddiness: Secondary | ICD-10-CM | POA: Diagnosis not present

## 2015-01-20 DIAGNOSIS — I2510 Atherosclerotic heart disease of native coronary artery without angina pectoris: Secondary | ICD-10-CM | POA: Diagnosis not present

## 2015-01-20 DIAGNOSIS — Z8673 Personal history of transient ischemic attack (TIA), and cerebral infarction without residual deficits: Secondary | ICD-10-CM | POA: Diagnosis not present

## 2015-01-20 DIAGNOSIS — F4310 Post-traumatic stress disorder, unspecified: Secondary | ICD-10-CM | POA: Diagnosis not present

## 2015-01-22 DIAGNOSIS — M1990 Unspecified osteoarthritis, unspecified site: Secondary | ICD-10-CM | POA: Diagnosis not present

## 2015-01-22 DIAGNOSIS — R2681 Unsteadiness on feet: Secondary | ICD-10-CM | POA: Diagnosis not present

## 2015-01-22 DIAGNOSIS — F4310 Post-traumatic stress disorder, unspecified: Secondary | ICD-10-CM | POA: Diagnosis not present

## 2015-01-22 DIAGNOSIS — I5030 Unspecified diastolic (congestive) heart failure: Secondary | ICD-10-CM | POA: Diagnosis not present

## 2015-01-22 DIAGNOSIS — I2510 Atherosclerotic heart disease of native coronary artery without angina pectoris: Secondary | ICD-10-CM | POA: Diagnosis not present

## 2015-01-22 DIAGNOSIS — G629 Polyneuropathy, unspecified: Secondary | ICD-10-CM | POA: Diagnosis not present

## 2015-01-22 DIAGNOSIS — I110 Hypertensive heart disease with heart failure: Secondary | ICD-10-CM | POA: Diagnosis not present

## 2015-01-22 DIAGNOSIS — R42 Dizziness and giddiness: Secondary | ICD-10-CM | POA: Diagnosis not present

## 2015-01-22 DIAGNOSIS — F329 Major depressive disorder, single episode, unspecified: Secondary | ICD-10-CM | POA: Diagnosis not present

## 2015-01-22 DIAGNOSIS — I739 Peripheral vascular disease, unspecified: Secondary | ICD-10-CM | POA: Diagnosis not present

## 2015-01-22 DIAGNOSIS — Z8673 Personal history of transient ischemic attack (TIA), and cerebral infarction without residual deficits: Secondary | ICD-10-CM | POA: Diagnosis not present

## 2015-01-22 DIAGNOSIS — I480 Paroxysmal atrial fibrillation: Secondary | ICD-10-CM | POA: Diagnosis not present

## 2015-01-22 DIAGNOSIS — N183 Chronic kidney disease, stage 3 (moderate): Secondary | ICD-10-CM | POA: Diagnosis not present

## 2015-01-26 DIAGNOSIS — I739 Peripheral vascular disease, unspecified: Secondary | ICD-10-CM | POA: Diagnosis not present

## 2015-01-26 DIAGNOSIS — I5030 Unspecified diastolic (congestive) heart failure: Secondary | ICD-10-CM | POA: Diagnosis not present

## 2015-01-26 DIAGNOSIS — I110 Hypertensive heart disease with heart failure: Secondary | ICD-10-CM | POA: Diagnosis not present

## 2015-01-26 DIAGNOSIS — G629 Polyneuropathy, unspecified: Secondary | ICD-10-CM | POA: Diagnosis not present

## 2015-01-26 DIAGNOSIS — R42 Dizziness and giddiness: Secondary | ICD-10-CM | POA: Diagnosis not present

## 2015-01-26 DIAGNOSIS — F329 Major depressive disorder, single episode, unspecified: Secondary | ICD-10-CM | POA: Diagnosis not present

## 2015-01-26 DIAGNOSIS — I2510 Atherosclerotic heart disease of native coronary artery without angina pectoris: Secondary | ICD-10-CM | POA: Diagnosis not present

## 2015-01-26 DIAGNOSIS — R2681 Unsteadiness on feet: Secondary | ICD-10-CM | POA: Diagnosis not present

## 2015-01-26 DIAGNOSIS — F4310 Post-traumatic stress disorder, unspecified: Secondary | ICD-10-CM | POA: Diagnosis not present

## 2015-01-26 DIAGNOSIS — Z8673 Personal history of transient ischemic attack (TIA), and cerebral infarction without residual deficits: Secondary | ICD-10-CM | POA: Diagnosis not present

## 2015-01-26 DIAGNOSIS — M1990 Unspecified osteoarthritis, unspecified site: Secondary | ICD-10-CM | POA: Diagnosis not present

## 2015-01-26 DIAGNOSIS — I480 Paroxysmal atrial fibrillation: Secondary | ICD-10-CM | POA: Diagnosis not present

## 2015-01-26 DIAGNOSIS — N183 Chronic kidney disease, stage 3 (moderate): Secondary | ICD-10-CM | POA: Diagnosis not present

## 2015-01-28 DIAGNOSIS — I5030 Unspecified diastolic (congestive) heart failure: Secondary | ICD-10-CM | POA: Diagnosis not present

## 2015-01-28 DIAGNOSIS — F4310 Post-traumatic stress disorder, unspecified: Secondary | ICD-10-CM | POA: Diagnosis not present

## 2015-01-28 DIAGNOSIS — F329 Major depressive disorder, single episode, unspecified: Secondary | ICD-10-CM | POA: Diagnosis not present

## 2015-01-28 DIAGNOSIS — I739 Peripheral vascular disease, unspecified: Secondary | ICD-10-CM | POA: Diagnosis not present

## 2015-01-28 DIAGNOSIS — R42 Dizziness and giddiness: Secondary | ICD-10-CM | POA: Diagnosis not present

## 2015-01-28 DIAGNOSIS — I110 Hypertensive heart disease with heart failure: Secondary | ICD-10-CM | POA: Diagnosis not present

## 2015-01-28 DIAGNOSIS — I480 Paroxysmal atrial fibrillation: Secondary | ICD-10-CM | POA: Diagnosis not present

## 2015-01-28 DIAGNOSIS — N183 Chronic kidney disease, stage 3 (moderate): Secondary | ICD-10-CM | POA: Diagnosis not present

## 2015-01-28 DIAGNOSIS — Z8673 Personal history of transient ischemic attack (TIA), and cerebral infarction without residual deficits: Secondary | ICD-10-CM | POA: Diagnosis not present

## 2015-01-28 DIAGNOSIS — M1990 Unspecified osteoarthritis, unspecified site: Secondary | ICD-10-CM | POA: Diagnosis not present

## 2015-01-28 DIAGNOSIS — G629 Polyneuropathy, unspecified: Secondary | ICD-10-CM | POA: Diagnosis not present

## 2015-01-28 DIAGNOSIS — I2510 Atherosclerotic heart disease of native coronary artery without angina pectoris: Secondary | ICD-10-CM | POA: Diagnosis not present

## 2015-01-28 DIAGNOSIS — R2681 Unsteadiness on feet: Secondary | ICD-10-CM | POA: Diagnosis not present

## 2015-02-09 ENCOUNTER — Ambulatory Visit (INDEPENDENT_AMBULATORY_CARE_PROVIDER_SITE_OTHER): Payer: Medicare Other | Admitting: Family Medicine

## 2015-02-09 ENCOUNTER — Encounter: Payer: Self-pay | Admitting: Family Medicine

## 2015-02-09 VITALS — BP 132/64 | HR 45 | Temp 97.4°F | Resp 14 | Ht 70.0 in | Wt 195.0 lb

## 2015-02-09 DIAGNOSIS — J0100 Acute maxillary sinusitis, unspecified: Secondary | ICD-10-CM

## 2015-02-09 DIAGNOSIS — R001 Bradycardia, unspecified: Secondary | ICD-10-CM

## 2015-02-09 MED ORDER — SALINE SPRAY 0.65 % NA SOLN: 1.0000 | Freq: Two times a day (BID) | NASAL | Status: DC

## 2015-02-09 MED ORDER — LEVOFLOXACIN 500 MG PO TABS: 500.0000 mg | ORAL_TABLET | Freq: Every day | ORAL | Status: DC

## 2015-02-09 NOTE — Patient Instructions (Signed)
Use the nasal saline  Take the levaquin Decrease bystolic to 5mg  once a day  F/U as previous

## 2015-02-09 NOTE — Progress Notes (Signed)
Patient ID: Jason [name], male   DOB: 26-Mar-1919, 81 y.o.   MRN: [id number]   Subjective:    Patient ID: Jason [name], male    DOB: 26-Mar-1919, 81 y.o.   MRN: [id number]  Patient presents for Illness   patient here secondary to feeling unwell for the past few days. He has had some sinus pressure or headache as well as some postnasal drip. He has had very little cough. He just feels weak. He has not had any significant fever. He has chronic dizziness the aforementioned some dizziness that he had this weekend. He is here today with his daughter who supplied information. He also had a little mild nausea but he has not had any vomiting he is still eating well there's been no change in his bowels. On review of his records from the assisted living facility he has significantly elevated blood pressures per their readings since his stroke. He was put on Bystolic 5 mg this was titrated up to 10 mg yet his blood pressures still on the 10 mg have been 120-180 over 70s to 100s which just does not seem quite accurate these are the same readings he had when he was on 5 mg. The only thing I do note is that his heart rate has decreased where he has been 40s to 60s.    Review Of Systems:  GEN- + fatigue, fever, weight loss,weakness, recent illness HEENT- denies eye drainage, change in vision, +nasal discharge, CVS- denies chest pain, palpitations RESP- denies SOB, +cough, wheeze ABD- denies N/V, change in stools, abd pain GU- denies dysuria, hematuria, dribbling, incontinence MSK- denies joint pain, muscle aches, injury Neuro- denies headache, dizziness, syncope, seizure activity       Objective:    BP 132/64 mmHg  Pulse 45  Temp(Src) 97.4 F (36.3 C) (Oral)  Resp 14  Ht 5\' 10"  (1.778 m)  Wt 195 lb (88.451 kg)  BMI 27.98 kg/m2  SpO2 95% GEN- NAD, alert and oriented x3 HEENT- PERRL, EOMI, non injected sclera, pink conjunctiva, MMM, oropharynx - post nasal drip, +sinus tenderness, nares clear  rhinorrhea  Neck- Supple, no LAD CVS- irregular irregular rhythm, no murmur, HR 50 RESP-CTAB ABD-NABS,soft,NT,ND EXT- No edema Pulses- Radial 2+        Assessment & Plan:      Problem List Items Addressed This Visit    None    Visit Diagnoses    Acute maxillary sinusitis, recurrence not specified    -  Primary    With age and cormobidies, will cover with levaquin, nasal saline    Relevant Medications    sodium chloride (OCEAN) 0.65 % SOLN nasal spray    levofloxacin (LEVAQUIN) 500 MG tablet    Bradycardia        Decrease bystolic to 5mg  once a day, I think this may be contributing to fatigue and no real change in BP, I doing think they are getting accurate readings        Note: This dictation was prepared with Dragon dictation along with smaller phrase technology. Any transcriptional errors that result from this process are unintentional.

## 2015-02-12 ENCOUNTER — Inpatient Hospital Stay (HOSPITAL_COMMUNITY): Payer: Medicare Other

## 2015-02-12 ENCOUNTER — Emergency Department (HOSPITAL_COMMUNITY): Payer: Medicare Other

## 2015-02-12 ENCOUNTER — Encounter (HOSPITAL_COMMUNITY): Payer: Self-pay | Admitting: Emergency Medicine

## 2015-02-12 ENCOUNTER — Inpatient Hospital Stay (HOSPITAL_COMMUNITY)
Admission: EM | Admit: 2015-02-12 | Discharge: 2015-02-14 | DRG: 683 | Disposition: A | Discharge status: Nursing Home (Includes ICF) | Payer: Medicare Other | Attending: Internal Medicine | Admitting: Internal Medicine

## 2015-02-12 DIAGNOSIS — Z8673 Personal history of transient ischemic attack (TIA), and cerebral infarction without residual deficits: Secondary | ICD-10-CM

## 2015-02-12 DIAGNOSIS — M1990 Unspecified osteoarthritis, unspecified site: Secondary | ICD-10-CM | POA: Diagnosis present

## 2015-02-12 DIAGNOSIS — K0889 Other specified disorders of teeth and supporting structures: Secondary | ICD-10-CM | POA: Diagnosis present

## 2015-02-12 DIAGNOSIS — Z8619 Personal history of other infectious and parasitic diseases: Secondary | ICD-10-CM | POA: Diagnosis not present

## 2015-02-12 DIAGNOSIS — N289 Disorder of kidney and ureter, unspecified: Secondary | ICD-10-CM | POA: Diagnosis not present

## 2015-02-12 DIAGNOSIS — M109 Gout, unspecified: Secondary | ICD-10-CM | POA: Diagnosis not present

## 2015-02-12 DIAGNOSIS — Z881 Allergy status to other antibiotic agents status: Secondary | ICD-10-CM

## 2015-02-12 DIAGNOSIS — I252 Old myocardial infarction: Secondary | ICD-10-CM

## 2015-02-12 DIAGNOSIS — Z8249 Family history of ischemic heart disease and other diseases of the circulatory system: Secondary | ICD-10-CM | POA: Diagnosis not present

## 2015-02-12 DIAGNOSIS — F419 Anxiety disorder, unspecified: Secondary | ICD-10-CM | POA: Diagnosis not present

## 2015-02-12 DIAGNOSIS — Z79899 Other long term (current) drug therapy: Secondary | ICD-10-CM | POA: Diagnosis not present

## 2015-02-12 DIAGNOSIS — R531 Weakness: Secondary | ICD-10-CM | POA: Diagnosis not present

## 2015-02-12 DIAGNOSIS — Z841 Family history of disorders of kidney and ureter: Secondary | ICD-10-CM

## 2015-02-12 DIAGNOSIS — E785 Hyperlipidemia, unspecified: Secondary | ICD-10-CM | POA: Diagnosis not present

## 2015-02-12 DIAGNOSIS — H5442 Blindness, left eye, normal vision right eye: Secondary | ICD-10-CM | POA: Diagnosis not present

## 2015-02-12 DIAGNOSIS — Z803 Family history of malignant neoplasm of breast: Secondary | ICD-10-CM

## 2015-02-12 DIAGNOSIS — R42 Dizziness and giddiness: Secondary | ICD-10-CM | POA: Diagnosis not present

## 2015-02-12 DIAGNOSIS — Z7901 Long term (current) use of anticoagulants: Secondary | ICD-10-CM

## 2015-02-12 DIAGNOSIS — Z7982 Long term (current) use of aspirin: Secondary | ICD-10-CM

## 2015-02-12 DIAGNOSIS — R001 Bradycardia, unspecified: Secondary | ICD-10-CM | POA: Diagnosis not present

## 2015-02-12 DIAGNOSIS — Z955 Presence of coronary angioplasty implant and graft: Secondary | ICD-10-CM | POA: Diagnosis not present

## 2015-02-12 DIAGNOSIS — K589 Irritable bowel syndrome without diarrhea: Secondary | ICD-10-CM | POA: Diagnosis present

## 2015-02-12 DIAGNOSIS — Z86711 Personal history of pulmonary embolism: Secondary | ICD-10-CM | POA: Diagnosis not present

## 2015-02-12 DIAGNOSIS — N179 Acute kidney failure, unspecified: Principal | ICD-10-CM | POA: Diagnosis present

## 2015-02-12 DIAGNOSIS — R0602 Shortness of breath: Secondary | ICD-10-CM | POA: Diagnosis not present

## 2015-02-12 DIAGNOSIS — F411 Generalized anxiety disorder: Secondary | ICD-10-CM | POA: Diagnosis not present

## 2015-02-12 DIAGNOSIS — N183 Chronic kidney disease, stage 3 (moderate): Secondary | ICD-10-CM | POA: Diagnosis present

## 2015-02-12 DIAGNOSIS — H9190 Unspecified hearing loss, unspecified ear: Secondary | ICD-10-CM | POA: Diagnosis present

## 2015-02-12 DIAGNOSIS — Z8672 Personal history of thrombophlebitis: Secondary | ICD-10-CM | POA: Diagnosis not present

## 2015-02-12 DIAGNOSIS — I4510 Unspecified right bundle-branch block: Secondary | ICD-10-CM | POA: Diagnosis present

## 2015-02-12 DIAGNOSIS — N401 Enlarged prostate with lower urinary tract symptoms: Secondary | ICD-10-CM | POA: Diagnosis present

## 2015-02-12 DIAGNOSIS — Z888 Allergy status to other drugs, medicaments and biological substances status: Secondary | ICD-10-CM | POA: Diagnosis not present

## 2015-02-12 DIAGNOSIS — R0902 Hypoxemia: Secondary | ICD-10-CM | POA: Diagnosis present

## 2015-02-12 DIAGNOSIS — G8929 Other chronic pain: Secondary | ICD-10-CM | POA: Diagnosis present

## 2015-02-12 DIAGNOSIS — Z22322 Carrier or suspected carrier of Methicillin resistant Staphylococcus aureus: Secondary | ICD-10-CM | POA: Diagnosis not present

## 2015-02-12 DIAGNOSIS — F4310 Post-traumatic stress disorder, unspecified: Secondary | ICD-10-CM | POA: Diagnosis not present

## 2015-02-12 DIAGNOSIS — E860 Dehydration: Secondary | ICD-10-CM

## 2015-02-12 DIAGNOSIS — I480 Paroxysmal atrial fibrillation: Secondary | ICD-10-CM | POA: Diagnosis not present

## 2015-02-12 DIAGNOSIS — R110 Nausea: Secondary | ICD-10-CM | POA: Diagnosis not present

## 2015-02-12 DIAGNOSIS — R338 Other retention of urine: Secondary | ICD-10-CM | POA: Diagnosis present

## 2015-02-12 DIAGNOSIS — R627 Adult failure to thrive: Secondary | ICD-10-CM | POA: Diagnosis not present

## 2015-02-12 DIAGNOSIS — Z86718 Personal history of other venous thrombosis and embolism: Secondary | ICD-10-CM

## 2015-02-12 DIAGNOSIS — I10 Essential (primary) hypertension: Secondary | ICD-10-CM | POA: Diagnosis not present

## 2015-02-12 DIAGNOSIS — Z7952 Long term (current) use of systemic steroids: Secondary | ICD-10-CM

## 2015-02-12 DIAGNOSIS — M545 Low back pain: Secondary | ICD-10-CM | POA: Diagnosis present

## 2015-02-12 DIAGNOSIS — I130 Hypertensive heart and chronic kidney disease with heart failure and stage 1 through stage 4 chronic kidney disease, or unspecified chronic kidney disease: Secondary | ICD-10-CM | POA: Diagnosis present

## 2015-02-12 DIAGNOSIS — M6281 Muscle weakness (generalized): Secondary | ICD-10-CM | POA: Diagnosis not present

## 2015-02-12 DIAGNOSIS — K219 Gastro-esophageal reflux disease without esophagitis: Secondary | ICD-10-CM | POA: Diagnosis present

## 2015-02-12 DIAGNOSIS — I2510 Atherosclerotic heart disease of native coronary artery without angina pectoris: Secondary | ICD-10-CM | POA: Diagnosis present

## 2015-02-12 DIAGNOSIS — R51 Headache: Secondary | ICD-10-CM | POA: Diagnosis not present

## 2015-02-12 DIAGNOSIS — I509 Heart failure, unspecified: Secondary | ICD-10-CM | POA: Diagnosis present

## 2015-02-12 DIAGNOSIS — I951 Orthostatic hypotension: Secondary | ICD-10-CM | POA: Diagnosis present

## 2015-02-12 DIAGNOSIS — R269 Unspecified abnormalities of gait and mobility: Secondary | ICD-10-CM

## 2015-02-12 DIAGNOSIS — G609 Hereditary and idiopathic neuropathy, unspecified: Secondary | ICD-10-CM

## 2015-02-12 LAB — CBC WITH DIFFERENTIAL/PLATELET
BASOS ABS: 0 10*3/uL (ref 0.0–0.1)
Basophils Relative: 0 %
Eosinophils Absolute: 0.3 10*3/uL (ref 0.0–0.7)
Eosinophils Relative: 4 %
HCT: 41.3 % (ref 39.0–52.0)
HEMOGLOBIN: 13.6 g/dL (ref 13.0–17.0)
LYMPHS ABS: 1.1 10*3/uL (ref 0.7–4.0)
LYMPHS PCT: 12 %
MCH: 32.1 pg (ref 26.0–34.0)
MCHC: 32.9 g/dL (ref 30.0–36.0)
MCV: 97.4 fL (ref 78.0–100.0)
Monocytes Absolute: 0.7 10*3/uL (ref 0.1–1.0)
Monocytes Relative: 8 %
NEUTROS ABS: 7.3 10*3/uL (ref 1.7–7.7)
NEUTROS PCT: 76 %
Platelets: 187 10*3/uL (ref 150–400)
RBC: 4.24 MIL/uL (ref 4.22–5.81)
RDW: 15.2 % (ref 11.5–15.5)
WBC: 9.5 10*3/uL (ref 4.0–10.5)

## 2015-02-12 LAB — I-STAT CG4 LACTIC ACID, ED: Lactic Acid, Venous: 1.95 mmol/L (ref 0.5–2.0)

## 2015-02-12 LAB — URINALYSIS, ROUTINE W REFLEX MICROSCOPIC
BILIRUBIN URINE: NEGATIVE
Glucose, UA: NEGATIVE mg/dL
Hgb urine dipstick: NEGATIVE
Ketones, ur: NEGATIVE mg/dL
Leukocytes, UA: NEGATIVE
NITRITE: NEGATIVE
PH: 6 (ref 5.0–8.0)
Protein, ur: NEGATIVE mg/dL
SPECIFIC GRAVITY, URINE: 1.018 (ref 1.005–1.030)

## 2015-02-12 LAB — COMPREHENSIVE METABOLIC PANEL
ALK PHOS: 56 U/L (ref 38–126)
ALT: 13 U/L — AB (ref 17–63)
AST: 18 U/L (ref 15–41)
Albumin: 3.6 g/dL (ref 3.5–5.0)
Anion gap: 11 (ref 5–15)
BUN: 54 mg/dL — ABNORMAL HIGH (ref 6–20)
CALCIUM: 9.2 mg/dL (ref 8.9–10.3)
CHLORIDE: 103 mmol/L (ref 101–111)
CO2: 27 mmol/L (ref 22–32)
CREATININE: 2.53 mg/dL — AB (ref 0.61–1.24)
GFR calc Af Amer: 24 mL/min — ABNORMAL LOW (ref >60)
GFR, EST NON AFRICAN AMERICAN: 20 mL/min — AB (ref >60)
GLUCOSE: 111 mg/dL — AB (ref 65–99)
Potassium: 4.1 mmol/L (ref 3.5–5.1)
Sodium: 141 mmol/L (ref 135–145)
Total Bilirubin: 0.9 mg/dL (ref 0.3–1.2)
Total Protein: 7.2 g/dL (ref 6.5–8.1)

## 2015-02-12 LAB — I-STAT TROPONIN, ED: TROPONIN I, POC: 0.01 ng/mL (ref 0.00–0.08)

## 2015-02-12 LAB — MRSA PCR SCREENING: MRSA BY PCR: POSITIVE — AB

## 2015-02-12 MED ORDER — DULOXETINE HCL 30 MG PO CPEP
30.0000 mg | ORAL_CAPSULE | Freq: Every day | ORAL | Status: DC
  Administered 2015-02-13 – 2015-02-14 (×2): 30 mg via ORAL
  Filled 2015-02-12 (×2): qty 1

## 2015-02-12 MED ORDER — HYDROCODONE-ACETAMINOPHEN 5-325 MG PO TABS: 1.0000 | ORAL_TABLET | Freq: Four times a day (QID) | ORAL | Status: DC | PRN

## 2015-02-12 MED ORDER — NITROGLYCERIN 0.4 MG SL SUBL: 0.4000 mg | SUBLINGUAL_TABLET | SUBLINGUAL | Status: DC | PRN

## 2015-02-12 MED ORDER — RIVAROXABAN 15 MG PO TABS
15.0000 mg | ORAL_TABLET | Freq: Every day | ORAL | Status: DC
  Administered 2015-02-12 – 2015-02-13 (×2): 15 mg via ORAL
  Filled 2015-02-12 (×3): qty 1

## 2015-02-12 MED ORDER — MIRABEGRON ER 25 MG PO TB24
25.0000 mg | ORAL_TABLET | Freq: Every day | ORAL | Status: DC
  Administered 2015-02-13: 25 mg via ORAL
  Filled 2015-02-12: qty 1

## 2015-02-12 MED ORDER — ALPRAZOLAM 0.25 MG PO TABS
0.2500 mg | ORAL_TABLET | Freq: Two times a day (BID) | ORAL | Status: DC | PRN
  Administered 2015-02-12 – 2015-02-13 (×2): 0.25 mg via ORAL
  Filled 2015-02-12 (×2): qty 1

## 2015-02-12 MED ORDER — SODIUM CHLORIDE 0.9 % IJ SOLN: 3.0000 mL | Freq: Two times a day (BID) | INTRAMUSCULAR | Status: DC

## 2015-02-12 MED ORDER — MECLIZINE HCL 12.5 MG PO TABS
12.5000 mg | ORAL_TABLET | Freq: Two times a day (BID) | ORAL | Status: DC | PRN
  Filled 2015-02-12: qty 1

## 2015-02-12 MED ORDER — LATANOPROST 0.005 % OP SOLN
1.0000 [drp] | Freq: Every day | OPHTHALMIC | Status: DC
  Administered 2015-02-12 – 2015-02-13 (×2): 1 [drp] via OPHTHALMIC
  Filled 2015-02-12: qty 2.5

## 2015-02-12 MED ORDER — SODIUM CHLORIDE 0.9 % IV SOLN
INTRAVENOUS | Status: DC
  Administered 2015-02-12 – 2015-02-13 (×2): via INTRAVENOUS

## 2015-02-12 MED ORDER — ASPIRIN EC 81 MG PO TBEC
81.0000 mg | DELAYED_RELEASE_TABLET | Freq: Every day | ORAL | Status: DC
  Administered 2015-02-13 – 2015-02-14 (×2): 81 mg via ORAL
  Filled 2015-02-12 (×2): qty 1

## 2015-02-12 MED ORDER — MUPIROCIN 2 % EX OINT
TOPICAL_OINTMENT | Freq: Two times a day (BID) | CUTANEOUS | Status: DC
  Administered 2015-02-12 – 2015-02-14 (×4): via NASAL
  Filled 2015-02-12 (×2): qty 22

## 2015-02-12 MED ORDER — POLYVINYL ALCOHOL 1.4 % OP SOLN
2.0000 [drp] | Freq: Three times a day (TID) | OPHTHALMIC | Status: DC
  Administered 2015-02-12 – 2015-02-14 (×4): 2 [drp] via OPHTHALMIC
  Filled 2015-02-12: qty 15

## 2015-02-12 MED ORDER — TAMSULOSIN HCL 0.4 MG PO CAPS
0.4000 mg | ORAL_CAPSULE | Freq: Every day | ORAL | Status: DC
  Administered 2015-02-13: 0.4 mg via ORAL
  Filled 2015-02-12: qty 1

## 2015-02-12 MED ORDER — SODIUM CHLORIDE 0.9 % IV SOLN: Freq: Once | INTRAVENOUS | Status: DC

## 2015-02-12 MED ORDER — TECHNETIUM TC 99M DIETHYLENETRIAME-PENTAACETIC ACID: 31.0000 | Freq: Once | INTRAVENOUS | Status: DC | PRN

## 2015-02-12 MED ORDER — HYDRALAZINE HCL 25 MG PO TABS
25.0000 mg | ORAL_TABLET | Freq: Two times a day (BID) | ORAL | Status: DC
  Administered 2015-02-12 – 2015-02-13 (×2): 25 mg via ORAL
  Filled 2015-02-12 (×2): qty 1

## 2015-02-12 MED ORDER — CHLORHEXIDINE GLUCONATE CLOTH 2 % EX PADS
6.0000 | MEDICATED_PAD | Freq: Every day | CUTANEOUS | Status: DC
  Administered 2015-02-13 – 2015-02-14 (×2): 6 via TOPICAL

## 2015-02-12 MED ORDER — NEBIVOLOL HCL 2.5 MG PO TABS
2.5000 mg | ORAL_TABLET | Freq: Every day | ORAL | Status: DC
  Administered 2015-02-13 – 2015-02-14 (×2): 2.5 mg via ORAL
  Filled 2015-02-12 (×3): qty 1

## 2015-02-12 MED ORDER — SIMVASTATIN 40 MG PO TABS
40.0000 mg | ORAL_TABLET | Freq: Every day | ORAL | Status: DC
  Administered 2015-02-12 – 2015-02-13 (×2): 40 mg via ORAL
  Filled 2015-02-12 (×3): qty 1

## 2015-02-12 MED ORDER — SALINE SPRAY 0.65 % NA SOLN
1.0000 | Freq: Two times a day (BID) | NASAL | Status: DC
  Administered 2015-02-12 – 2015-02-14 (×4): 1 via NASAL
  Filled 2015-02-12: qty 44

## 2015-02-12 MED ORDER — TECHNETIUM TO 99M ALBUMIN AGGREGATED: 4.2000 | Freq: Once | INTRAVENOUS | Status: DC | PRN

## 2015-02-12 MED ORDER — SODIUM CHLORIDE 0.9 % IV BOLUS (SEPSIS)
1000.0000 mL | Freq: Once | INTRAVENOUS | Status: AC
  Administered 2015-02-12: 1000 mL via INTRAVENOUS

## 2015-02-12 NOTE — ED Provider Notes (Signed)
CSN: [id number]     Arrival date & time 02/12/15  1004 History   First MD Initiated Contact with Patient 02/12/15 1013     Chief Complaint  Patient presents with  . Sinus Infection   . Poor Output      (Consider location/radiation/quality/duration/timing/severity/associated sxs/prior Treatment) HPI Jason [name] is a 81 y.o. male with hx of CAD, PE, Vertigo, GERD, CVA, CAD, presents to emergency department complaining of generalized weakness, sinus pressure, dizziness. Patient states symptoms started several days ago. He was seen by primary care doctor and started on Levaquin for sinusitis and saline intranasally. Patient states this morning he was so dizzy he couldn't walk and also reports some difficulty speaking which she attributes to dry mouth. According to facility notes, Patient's "kidney doctor reported low kidney output?" Patient states he is currently feeling better and requesting something to drink for dry mouth. He denies any difficulty speaking at this time. He states he is still weak, however denies any dizziness at this time. No chest pain or shortness of breath. No fever. Only mild cough. No urinary symptoms. No other complaints.  Past Medical History  Diagnosis Date  . PVD (peripheral vascular disease) (HCC)   . CAD (coronary artery disease)     a. 12/2005 - PCI to OM  . Hyperlipidemia   . Cerebrovascular accident (HCC)   . DVT femoral (deep venous thrombosis) with thrombophlebitis (HCC) 03/2007    [name]/notes 07/13/2010  . GERD (gastroesophageal reflux disease)   . Edema   . Vertigo   . IBS (irritable bowel syndrome)   . Duodenitis   . Gastritis   . Esophageal stricture   . Cellulitis   . Pulmonary embolism (HCC)   . Prostatic hypertrophy   . Frequent falls   . Subdural hematoma (HCC)   . PTSD (post-traumatic stress disorder)   . Vertigo   . TIA (transient ischemic attack)     a. 01/2006 and 05/2006.   [name]. Hiatal hernia     periferal neuropathy  . Hx of echocardiogram      Echo 9/13:  Mild LVH, EF 60-65%, Gr 1 diast dysfn, mild AI, mild BAE  . Gout   . Chronic low back pain   . Gait disorder   . Peripheral neuropathy (HCC)   . Hydrocele   . History of shingles     Left lumbar  . HOH (hard of hearing)     hearing aid, totally in R ear ( no hearing aid in L )   . Myocardial infarction [facility](HCC) "years ago"    "dr said I'd had a small one"  . Daily headache   . Depression   . Urinary frequency   . Blind left eye   . Hypertension   . Dysrhythmia   . Arthritis   . Anxiety     PTSD   Past Surgical History  Procedure Laterality Date  . Hand surgery Right     "replaced bone in my finger"  . Angioplasty    . Carotid endarterectomy Left   . Coronary angioplasty with stent placement      left circumflex  . Cataract extraction, bilateral    . Tonsillectomy    . Tympanic membrane repair Right 1978    [name]/notes 07/27/2010; "couldn'getting so t couldn't hear; had noise in my ear; didn't correct either  . Hand contracture release Right     palm/notes 07/27/2010  . Esophagogastroduodenoscopy (egd) with esophageal dilation    . Insertion of vena  cava filter  2013    h/o PE  . Endarterectomy Left 11/02/2014    Procedure: LEFT  CAROTID ARTERY ENDARTERECTOMY  WITH DACRON PATCH ANGIOPLASTY;  Surgeon: Sherren Kerns, MD;  Location: [facility] OR;  Service: Vascular;  Laterality: Left;   Family History  Problem Relation Age of Onset  . Colon cancer Neg Hx   . Coronary artery disease Brother   . Heart attack Father   . Migraines Mother   . Breast cancer Sister   . Dementia Sister   . Renal Disease Brother    Social History  Substance Use Topics  . Smoking status: Never Smoker   . Smokeless tobacco: Never Used  . Alcohol Use: 0.0 oz/week    0 Standard drinks or equivalent per week     Comment: wine occasionally    Review of Systems  Constitutional: Positive for fatigue. Negative for fever and chills.  HENT: Positive for congestion, postnasal drip and sinus  pressure. Negative for facial swelling.   Respiratory: Negative for cough, chest tightness and shortness of breath.   Cardiovascular: Negative for chest pain, palpitations and leg swelling.  Gastrointestinal: Negative for nausea, vomiting, abdominal pain, diarrhea and abdominal distention.  Genitourinary: Negative for dysuria, urgency, frequency and hematuria.  Musculoskeletal: Negative for myalgias, arthralgias, neck pain and neck stiffness.  Skin: Negative for rash.  Neurological: Positive for dizziness, speech difficulty, weakness, light-headedness and headaches. Negative for numbness.  All other systems reviewed and are negative.     Allergies  Ativan; Augmentin; Fludrocortisone acetate; and Scopace  Home Medications   Prior to Admission medications   Medication Sig Start Date End Date Taking? Authorizing Provider  acetaminophen (TYLENOL) 500 MG tablet Take 1,000 mg by mouth 2 (two) times daily. Take every day per [facility]    Historical Provider, MD  ALPRAZolam (XANAX) 0.25 MG tablet Take 1 tablet (0.25 mg total) by mouth 2 (two) times daily as needed for anxiety. 12/23/14   Shanker Levora Dredge, MD  alum & mag hydroxide-simeth (MAALOX/MYLANTA) 200-200-20 MG/5ML suspension Take 30 mLs by mouth every 6 (six) hours as needed for indigestion or heartburn.    Historical Provider, MD  aspirin EC 81 MG EC tablet Take 1 tablet (81 mg total) by mouth daily. 12/23/14   Shanker Levora Dredge, MD  DEXILANT 60 MG capsule TAKE ONE CAPSULE BY MOUTH EVERY DAY 03/16/14   Donita Brooks, MD  diclofenac sodium (VOLTAREN) 1 % GEL Apply 2 g topically 4 (four) times daily. 06/22/14   Donita Brooks, MD  docusate sodium (COLACE) 100 MG capsule Take 1 capsule (100 mg total) by mouth at bedtime. 07/14/14   Donita Brooks, MD  DULoxetine (CYMBALTA) 30 MG capsule TAKE ONE CAPSULE BY MOUTH EVERY DAY 09/07/14   York Spaniel, MD  furosemide (LASIX) 40 MG tablet Take 40 mg by mouth daily.    Historical Provider, MD   latanoprost (XALATAN) 0.005 % ophthalmic solution Place 1 drop into both eyes at bedtime.    Historical Provider, MD  levofloxacin (LEVAQUIN) 500 MG tablet Take 1 tablet (500 mg total) by mouth daily. 02/09/15   Salley Scarlet, MD  loperamide (IMODIUM) 2 MG capsule Take 2 mg by mouth as needed for diarrhea or loose stools (Not to exceed 8 doses in 24 hours).    Historical Provider, MD  magnesium hydroxide (MILK OF MAGNESIA) 400 MG/5ML suspension Take 30 mLs by mouth at bedtime as needed for mild constipation.    Historical Provider, MD  meclizine (ANTIVERT) 12.5 MG tablet Take 12.5 mg by mouth every 12 (twelve) hours as needed for dizziness.    Historical Provider, MD  MYRBETRIQ 25 MG TB24 tablet TAKE 1 TABLET BY MOUTH EVERY DAY 07/13/14   Donita Brooks, MD  nebivolol (BYSTOLIC) 5 MG tablet Take 1 tablet (5 mg total) by mouth daily. 01/12/15   Donita Brooks, MD  nitroGLYCERIN (NITROSTAT) 0.4 MG SL tablet Place 0.4 mg under the tongue every 5 (five) minutes as needed. Chest pain    Historical Provider, MD  ondansetron (ZOFRAN) 4 MG tablet Take 4 mg by mouth every 8 (eight) hours as needed for nausea.    Historical Provider, MD  polyethylene glycol powder (GLYCOLAX/MIRALAX) powder MIX 17 GRAMS WITH 4 TO 6 OUNCES OF WATER AND DRINK ONCE DAILY AS NEEDED FOR CONSTIPATION 03/07/13   Donita Brooks, MD  polyvinyl alcohol (LIQUIFILM TEARS) 1.4 % ophthalmic solution Place 2 drops into both eyes 3 (three) times daily.    Historical Provider, MD  predniSONE (DELTASONE) 10 MG tablet Take 1 tablet (10 mg total) by mouth daily with breakfast. 09/22/14   Donita Brooks, MD  simvastatin (ZOCOR) 40 MG tablet Take 1 tablet (40 mg total) by mouth daily at 6 PM. 12/23/14   Shanker Levora Dredge, MD  sodium chloride (OCEAN) 0.65 % SOLN nasal spray Place 1 spray into both nostrils 2 (two) times daily. For 1 week 02/09/15   Salley Scarlet, MD  tamsulosin (FLOMAX) 0.4 MG CAPS capsule Take 0.4 mg by mouth daily.     Historical Provider, MD  XARELTO 15 MG TABS tablet TAKE 1 TABLET BY MOUTH EVERY DAY WITH SUPPER 10/06/14   Tonny Bollman, MD   BP 142/74 mmHg  Pulse 57  Temp(Src) 97.4 F (36.3 C) (Oral)  Resp 22  Ht 6' (1.829 m)  Wt 89.812 kg  BMI 26.85 kg/m2  SpO2 95% Physical Exam  Constitutional: He is oriented to person, place, and time. He appears well-developed and well-nourished. No distress.  HENT:  Head: Normocephalic and atraumatic.  Right Ear: External ear normal.  Left Ear: External ear normal.  Nose: Nose normal.  No facial tenderness. Poor dentition, mostly adentulous  Eyes: Conjunctivae and EOM are normal. Pupils are equal, round, and reactive to light.  Neck: Neck supple.  Cardiovascular: Normal rate, regular rhythm and normal heart sounds.   Pulmonary/Chest: Effort normal. No respiratory distress. He has no wheezes. He has no rales.  Abdominal: Soft. Bowel sounds are normal. He exhibits no distension. There is no tenderness. There is no rebound.  Musculoskeletal: He exhibits no edema.  Neurological: He is alert and oriented to person, place, and time. No cranial nerve deficit. Coordination normal.  5/5 and equal upper and lower extremity strength bilaterally. Equal grip strength bilaterally. Normal finger to nose and heel to shin. No pronator drift.   Skin: Skin is warm and dry.  Psychiatric: He has a normal mood and affect. His behavior is normal.  Nursing note and vitals reviewed.   ED Course  Procedures (including critical care time) Labs Review Labs Reviewed  COMPREHENSIVE METABOLIC PANEL - Abnormal; Notable for the following:    Glucose, Bld 111 (*)    BUN 54 (*)    Creatinine, Ser 2.53 (*)    ALT 13 (*)    GFR calc non Af Amer 20 (*)    GFR calc Af Amer 24 (*)    All other components within normal limits  MRSA PCR SCREENING  CBC WITH DIFFERENTIAL/PLATELET  URINALYSIS, ROUTINE W REFLEX MICROSCOPIC (NOT AT [facility])  I-STAT CG4 LACTIC ACID, ED  I-STAT TROPOININ, ED   I-STAT CG4 LACTIC ACID, ED    Imaging Review Dg Chest 2 View  02/12/2015  CLINICAL DATA:  Shortness of breath EXAM: CHEST  2 VIEW COMPARISON:  12/21/2014 FINDINGS: No cardiomegaly when accounting for an apical fat pad. Stable aortic and hilar contours. There is no edema, consolidation, effusion, or pneumothorax. No acute osseous findings. IMPRESSION: No active cardiopulmonary disease. Electronically Signed   By: Marnee Spring M.D.   On: 02/12/2015 11:45   Ct Head Wo Contrast  02/12/2015  CLINICAL DATA:  Three-day history of headache and dizziness EXAM: CT HEAD WITHOUT CONTRAST TECHNIQUE: Contiguous axial images were obtained from the base of the skull through the vertex without intravenous contrast. COMPARISON:  Head CT December 21, 2014; brain MRI December 21, 2014 FINDINGS: Mild to moderate diffuse atrophy remain stable. There is no intracranial mass hemorrhage, extra-axial fluid collection, or midline shift. There is evidence of a prior infarct in the proximal left parietal lobe, stable. There is a prior infarct in the posterior right parietal lobe, stable. There is moderate small vessel disease throughout the centra semiovale bilaterally. No new gray-white compartment lesions appreciable. No acute infarct is evident. The bony calvarium appears intact. The mastoid air cells are clear. No intraorbital lesions are identified. IMPRESSION: Prior infarcts, stable. Atrophy with small vessel disease, stable. No mass, hemorrhage, or acute appearing infarct. Electronically Signed   By: Bretta Bang III M.D.   On: 02/12/2015 11:51   I have personally reviewed and evaluated these images and lab results as part of my medical decision-making.   EKG Interpretation   Date/Time:  Friday February 12 2015 11:55:09 EST Ventricular Rate:  67 PR Interval:  77 QRS Duration: 144 QT Interval:  471 QTC Calculation: 497 R Axis:   54 Text Interpretation:  Sinus rhythm Atrial premature complex Sinus pause   Short PR interval Right bundle branch block No significant change since  last tracing Confirmed by Anitra Lauth  MD, Alphonzo Lemmings ([id number]) on 02/12/2015  11:59:50 AM      MDM   Final diagnoses:  Hypoxia  Weakness  Renal insufficiency    patient with generalized weakness, dizziness this morning, difficulty standing up and walking. Patient states this felt like his vertigo. He is currently asymptomatic. Will check labs, CT head, monitor.   Labs showed elevated creatinine. Pt hydrated with 1L NS, will start on 132mL/hr fluids. Pt also found to by hypoxic. Lungs clear. CXR clear. Will admit for renal injury and hypoxia.   Spoke with admitting doctor, asked to get VQ scan. Pt is on xarelto and has hx of PE. Will order.   Filed Vitals:   02/12/15 1047 02/12/15 1200 02/12/15 1300 02/12/15 1456  BP:  120/53 133/60 159/77  Pulse:  49 50 51  Temp: 98 F (36.7 C)   97.2 F (36.2 C)  TempSrc:      Resp:  15 19 18   Height:      Weight:      SpO2:  92% 95% 96%     Jaynie Crumble, PA-C 02/12/15 1540  Gwyneth Sprout, MD 02/15/15 [phone]

## 2015-02-12 NOTE — ED Notes (Signed)
Report given to Jesc LLCKelean

## 2015-02-12 NOTE — ED Notes (Signed)
Bed: WA21 Expected date:  Expected time:  Means of arrival:  Comments: Ems-sinus infection, wc bound

## 2015-02-12 NOTE — ED Notes (Signed)
Pt can go at 13:50 per Marissa.

## 2015-02-12 NOTE — Progress Notes (Signed)
ANTICOAGULATION CONSULT NOTE - Initial Consult  Pharmacy Consult for Xarelto Indication: atrial fibrillation  Allergies  Allergen Reactions  . Ativan [Lorazepam] Other (See Comments)    unknown  . Augmentin [Amoxicillin-Pot Clavulanate] Nausea And Vomiting    daughter states he can tolerate Amoxicillin ok Profuse vomiting. Unable to answer penicillin questions as pt is from Nursing home and unable to provide answers to questions  . Belladonna Alkaloids     Unknown- on MAR  . Corticosteroids     Unknown- on MAR  . Fludrocortisone Acetate Other (See Comments)    "leg swelling"  . Parabens     Unknown- on MAR  . Scopace [Scopolamine] Other (See Comments)    *patch "Made him crazy" per patients daughter     Patient Measurements: Height: 6' (182.9 cm) Weight: 198 lb (89.812 kg) IBW/kg (Calculated) : 77.6  Vital Signs: Temp: 97.2 F (36.2 C) (12/02 1456) Temp Source: Oral (12/02 1019) BP: 159/77 mmHg (12/02 1456) Pulse Rate: 51 (12/02 1456)  Labs:  Recent Labs  02/12/15 1058  HGB 13.6  HCT 41.3  PLT 187  CREATININE 2.53*    Estimated Creatinine Clearance: 20 mL/min (by C-G formula based on Cr of 2.53).   Medications:  Prescriptions prior to admission  Medication Sig Dispense Refill Last Dose  . acetaminophen (TYLENOL) 500 MG tablet Take 1,000 mg by mouth 2 (two) times daily. Take every day per [facility]   02/12/2015 at Unknown time  . ALPRAZolam (XANAX) 0.25 MG tablet Take 1 tablet (0.25 mg total) by mouth 2 (two) times daily as needed for anxiety. 10 tablet 0 unknown  . alum & mag hydroxide-simeth (MAALOX/MYLANTA) 200-200-20 MG/5ML suspension Take 30 mLs by mouth every 6 (six) hours as needed for indigestion or heartburn.   unknown  . aspirin EC 81 MG EC tablet Take 1 tablet (81 mg total) by mouth daily.   02/12/2015 at Unknown time  . DEXILANT 60 MG capsule TAKE ONE CAPSULE BY MOUTH EVERY DAY 30 capsule 11 02/12/2015 at Unknown time  . diclofenac sodium (VOLTAREN) 1 %  GEL Apply 2 g topically 4 (four) times daily. (Patient taking differently: Apply 2 g topically every 6 (six) hours as needed (pain). ) 100 g 5 unknown  . docusate sodium (COLACE) 100 MG capsule Take 1 capsule (100 mg total) by mouth at bedtime. 30 capsule 11 02/11/2015 at Unknown time  . DULoxetine (CYMBALTA) 30 MG capsule TAKE ONE CAPSULE BY MOUTH EVERY DAY 30 capsule 6 02/12/2015 at Unknown time  . furosemide (LASIX) 40 MG tablet Take 40 mg by mouth daily.   02/12/2015 at Unknown time  . hydrALAZINE (APRESOLINE) 25 MG tablet Take 25 mg by mouth 2 (two) times daily.   02/12/2015 at Unknown time  . HYDROcodone-acetaminophen (NORCO/VICODIN) 5-325 MG tablet Take 1 tablet by mouth every 6 (six) hours as needed for moderate pain.   unknown  . latanoprost (XALATAN) 0.005 % ophthalmic solution Place 1 drop into both eyes at bedtime.   02/11/2015 at Unknown time  . levofloxacin (LEVAQUIN) 500 MG tablet Take 1 tablet (500 mg total) by mouth daily. 7 tablet 0 02/12/2015 at Unknown time  . loperamide (IMODIUM) 2 MG capsule Take 2 mg by mouth as needed for diarrhea or loose stools (Not to exceed 8 doses in 24 hours).   unknown  . magnesium hydroxide (MILK OF MAGNESIA) 400 MG/5ML suspension Take 30 mLs by mouth at bedtime as needed for mild constipation.   unknown  . meclizine (ANTIVERT) 12.5  MG tablet Take 12.5 mg by mouth every 12 (twelve) hours as needed for dizziness.   unknown  . MYRBETRIQ 25 MG TB24 tablet TAKE 1 TABLET BY MOUTH EVERY DAY 30 tablet 11 02/12/2015 at Unknown time  . nebivolol (BYSTOLIC) 5 MG tablet Take 1 tablet (5 mg total) by mouth daily. 30 tablet 5 02/12/2015 at 0800  . nitroGLYCERIN (NITROSTAT) 0.4 MG SL tablet Place 0.4 mg under the tongue every 5 (five) minutes as needed. Chest pain   unknown  . ondansetron (ZOFRAN) 4 MG tablet Take 4 mg by mouth every 8 (eight) hours as needed for nausea.   unknown  . polyethylene glycol powder (GLYCOLAX/MIRALAX) powder MIX 17 GRAMS WITH 4 TO 6 OUNCES OF  WATER AND DRINK ONCE DAILY AS NEEDED FOR CONSTIPATION 527 g 2 unknown  . polyvinyl alcohol (LIQUIFILM TEARS) 1.4 % ophthalmic solution Place 2 drops into both eyes 3 (three) times daily.   02/12/2015 at 0800  . predniSONE (DELTASONE) 10 MG tablet Take 1 tablet (10 mg total) by mouth daily with breakfast. 30 tablet 5 02/12/2015 at Unknown time  . simvastatin (ZOCOR) 40 MG tablet Take 1 tablet (40 mg total) by mouth daily at 6 PM.   02/11/2015 at Unknown time  . sodium chloride (OCEAN) 0.65 % SOLN nasal spray Place 1 spray into both nostrils 2 (two) times daily. For 1 week 1 Bottle 0 02/12/2015 at Unknown time  . tamsulosin (FLOMAX) 0.4 MG CAPS capsule Take 0.4 mg by mouth daily.   02/12/2015 at Unknown time  . XARELTO 15 MG TABS tablet TAKE 1 TABLET BY MOUTH EVERY DAY WITH SUPPER 30 tablet 6 02/11/2015 at 1700   Scheduled:  . sodium chloride   Intravenous Once  . [START ON 02/13/2015] aspirin EC  81 mg Oral Daily  . [START ON 02/13/2015] Chlorhexidine Gluconate Cloth  6 each Topical Q0600  . [START ON 02/13/2015] DULoxetine  30 mg Oral Daily  . hydrALAZINE  25 mg Oral BID  . latanoprost  1 drop Both Eyes QHS  . [START ON 02/13/2015] mirabegron ER  25 mg Oral Daily  . mupirocin ointment   Nasal BID  . nebivolol  2.5 mg Oral Daily  . polyvinyl alcohol  2 drop Both Eyes TID  . rivaroxaban  15 mg Oral Q supper  . simvastatin  40 mg Oral q1800  . sodium chloride  1 spray Each Nare BID  . sodium chloride  3 mL Intravenous Q12H  . tamsulosin  0.4 mg Oral Daily    Assessment: [age] yoM w/ PMH of CAD s/p PCI 2007, PVD, recent CEA, PAF on Xarelto, recurrent DVTs s/p IVC filter, CVA, CKD, who presenting with generalized weakness, sinus pressure, dizziness.  Pharmacy consulted to resume Xarelto while admitted.   CBC wnl  SCr 2x baseline; CrCl 20 ml/min  No signs or documentation of bleeding  Goal of Therapy:  Prevent thromboembolism   Plan:   Resume Xarelto 15 mg PO daily with supper  F/u SCr, check  CBC q72 hr  Monitor for signs of bleeding  Bernadene Personrew Jishnu Jenniges, PharmD, BCPS Pager: [phone] 02/12/2015, 7:21 PM

## 2015-02-12 NOTE — ED Notes (Signed)
PT to XRAY

## 2015-02-12 NOTE — ED Notes (Signed)
Called Nuc. Med. Anticipated test soon. Awaiting this to transfer the patient

## 2015-02-12 NOTE — H&P (Addendum)
History and Physical  Jason [name] VHQ:[id number] DOB: April 11, 1919 DOA: 02/12/2015  Referring physician: EDP PCP: Leo GrosserPICKARD,[name], MD   Chief Complaint: not feeling well  HPI: Jason [name] is a 81 y.o. male   With h/o paroxysmal afib on xarelto, h/o recurrent dvt, s/p ivc filter, h/o carotid stenosis s/p CEA in 2016, h/o recent cva with slurred speech ( resolved), he was sent from ALF due to c/o not feeling well, generalized weakness, decreased urine output. He reported recently was treated for sinusitis with levaquin, currently he denies fever, no headache, no cough, no sob, no n/v, no diarrhea, ED course: ct head no acute findings, cxr unremarkable, ua no infection, labs showed elevated cr at 2.53 (baseline 1.4 to 1.6). Tele showed bradycardia.  Review of Systems:  Detail per HPI, Review of systems are otherwise negative  Past Medical History  Diagnosis Date  . PVD (peripheral vascular disease) (HCC)   . CAD (coronary artery disease)     a. 12/2005 - PCI to OM  . Hyperlipidemia   . Cerebrovascular accident (HCC)   . DVT femoral (deep venous thrombosis) with thrombophlebitis (HCC) 03/2007    [name]/notes 07/13/2010  . GERD (gastroesophageal reflux disease)   . Edema   . Vertigo   . IBS (irritable bowel syndrome)   . Duodenitis   . Gastritis   . Esophageal stricture   . Cellulitis   . Pulmonary embolism (HCC)   . Prostatic hypertrophy   . Frequent falls   . Subdural hematoma (HCC)   . PTSD (post-traumatic stress disorder)   . Vertigo   . TIA (transient ischemic attack)     a. 01/2006 and 05/2006.   [name]. Hiatal hernia     periferal neuropathy  . Hx of echocardiogram     Echo 9/13:  Mild LVH, EF 60-65%, Gr 1 diast dysfn, mild AI, mild BAE  . Gout   . Chronic low back pain   . Gait disorder   . Peripheral neuropathy (HCC)   . Hydrocele   . History of shingles     Left lumbar  . HOH (hard of hearing)     hearing aid, totally in R ear ( no hearing aid in L )   . Myocardial  infarction [facility](HCC) "years ago"    "dr said I'd had a small one"  . Daily headache   . Depression   . Urinary frequency   . Blind left eye   . Hypertension   . Dysrhythmia   . Arthritis   . Anxiety     PTSD   Past Surgical History  Procedure Laterality Date  . Hand surgery Right     "replaced bone in my finger"  . Angioplasty    . Carotid endarterectomy Left   . Coronary angioplasty with stent placement      left circumflex  . Cataract extraction, bilateral    . Tonsillectomy    . Tympanic membrane repair Right 1978    [name]/notes 07/27/2010; "couldn'getting so t couldn't hear; had noise in my ear; didn't correct either  . Hand contracture release Right     palm/notes 07/27/2010  . Esophagogastroduodenoscopy (egd) with esophageal dilation    . Insertion of vena cava filter  2013    h/o PE  . Endarterectomy Left 11/02/2014    Procedure: LEFT  CAROTID ARTERY ENDARTERECTOMY  WITH DACRON PATCH ANGIOPLASTY;  Surgeon: Sherren Kernsharles E Fields, MD;  [facility];  Service: Vascular;  Laterality: Left;   Social History:  reports that he has never smoked. He has never used smokeless tobacco. He reports that he drinks alcohol. He reports that he does not use illicit drugs. Patient lives at ALF& ambulate with a walker, stopped driving 6months ago due to imbalance and dizziness.  Allergies  Allergen Reactions  . Ativan [Lorazepam] Other (See Comments)    unknown  . Augmentin [Amoxicillin-Pot Clavulanate] Nausea And Vomiting    daughter states he can tolerate Amoxicillin ok Profuse vomiting. Unable to answer penicillin questions as pt is from Nursing home and unable to provide answers to questions  . Belladonna Alkaloids     Unknown- on MAR  . Corticosteroids     Unknown- on MAR  . Fludrocortisone Acetate Other (See Comments)    "leg swelling"  . Parabens     Unknown- on MAR  . Scopace [Scopolamine] Other (See Comments)    *patch "Made him crazy" per patients daughter     Family History    Problem Relation Age of Onset  . Colon cancer Neg Hx   . Coronary artery disease Brother   . Heart attack Father   . Migraines Mother   . Breast cancer Sister   . Dementia Sister   . Renal Disease Brother       Prior to Admission medications   Medication Sig Start Date End Date Taking? Authorizing Provider  acetaminophen (TYLENOL) 500 MG tablet Take 1,000 mg by mouth 2 (two) times daily. Take every day per [facility]   Yes Historical Provider, MD  ALPRAZolam (XANAX) 0.25 MG tablet Take 1 tablet (0.25 mg total) by mouth 2 (two) times daily as needed for anxiety. 12/23/14  Yes Shanker Levora Dredge, MD  alum & mag hydroxide-simeth (MAALOX/MYLANTA) 200-200-20 MG/5ML suspension Take 30 mLs by mouth every 6 (six) hours as needed for indigestion or heartburn.   Yes Historical Provider, MD  aspirin EC 81 MG EC tablet Take 1 tablet (81 mg total) by mouth daily. 12/23/14  Yes Shanker Levora Dredge, MD  DEXILANT 60 MG capsule TAKE ONE CAPSULE BY MOUTH EVERY DAY 03/16/14  Yes Donita Brooks, MD  diclofenac sodium (VOLTAREN) 1 % GEL Apply 2 g topically 4 (four) times daily. Patient taking differently: Apply 2 g topically every 6 (six) hours as needed (pain).  06/22/14  Yes Donita Brooks, MD  docusate sodium (COLACE) 100 MG capsule Take 1 capsule (100 mg total) by mouth at bedtime. 07/14/14  Yes Donita Brooks, MD  DULoxetine (CYMBALTA) 30 MG capsule TAKE ONE CAPSULE BY MOUTH EVERY DAY 09/07/14  Yes York Spaniel, MD  furosemide (LASIX) 40 MG tablet Take 40 mg by mouth daily.   Yes Historical Provider, MD  hydrALAZINE (APRESOLINE) 25 MG tablet Take 25 mg by mouth 2 (two) times daily.   Yes Historical Provider, MD  HYDROcodone-acetaminophen (NORCO/VICODIN) 5-325 MG tablet Take 1 tablet by mouth every 6 (six) hours as needed for moderate pain.   Yes Historical Provider, MD  latanoprost (XALATAN) 0.005 % ophthalmic solution Place 1 drop into both eyes at bedtime.   Yes Historical Provider, MD  levofloxacin  (LEVAQUIN) 500 MG tablet Take 1 tablet (500 mg total) by mouth daily. 02/09/15  Yes Salley Scarlet, MD  loperamide (IMODIUM) 2 MG capsule Take 2 mg by mouth as needed for diarrhea or loose stools (Not to exceed 8 doses in 24 hours).   Yes Historical Provider, MD  magnesium hydroxide (MILK OF MAGNESIA) 400 MG/5ML suspension Take 30 mLs by mouth at bedtime as  needed for mild constipation.   Yes Historical Provider, MD  meclizine (ANTIVERT) 12.5 MG tablet Take 12.5 mg by mouth every 12 (twelve) hours as needed for dizziness.   Yes Historical Provider, MD  MYRBETRIQ 25 MG TB24 tablet TAKE 1 TABLET BY MOUTH EVERY DAY 07/13/14  Yes Donita Brooks, MD  nebivolol (BYSTOLIC) 5 MG tablet Take 1 tablet (5 mg total) by mouth daily. 01/12/15  Yes Donita Brooks, MD  nitroGLYCERIN (NITROSTAT) 0.4 MG SL tablet Place 0.4 mg under the tongue every 5 (five) minutes as needed. Chest pain   Yes Historical Provider, MD  ondansetron (ZOFRAN) 4 MG tablet Take 4 mg by mouth every 8 (eight) hours as needed for nausea.   Yes Historical Provider, MD  polyethylene glycol powder (GLYCOLAX/MIRALAX) powder MIX 17 GRAMS WITH 4 TO 6 OUNCES OF WATER AND DRINK ONCE DAILY AS NEEDED FOR CONSTIPATION 03/07/13  Yes Donita Brooks, MD  polyvinyl alcohol (LIQUIFILM TEARS) 1.4 % ophthalmic solution Place 2 drops into both eyes 3 (three) times daily.   Yes Historical Provider, MD  predniSONE (DELTASONE) 10 MG tablet Take 1 tablet (10 mg total) by mouth daily with breakfast. 09/22/14  Yes Donita Brooks, MD  simvastatin (ZOCOR) 40 MG tablet Take 1 tablet (40 mg total) by mouth daily at 6 PM. 12/23/14  Yes Shanker Levora Dredge, MD  sodium chloride (OCEAN) 0.65 % SOLN nasal spray Place 1 spray into both nostrils 2 (two) times daily. For 1 week 02/09/15  Yes Salley Scarlet, MD  tamsulosin (FLOMAX) 0.4 MG CAPS capsule Take 0.4 mg by mouth daily.   Yes Historical Provider, MD  XARELTO 15 MG TABS tablet TAKE 1 TABLET BY MOUTH EVERY DAY WITH  SUPPER 10/06/14  Yes Tonny Bollman, MD    Physical Exam: BP 133/60 mmHg  Pulse 50  Temp(Src) 98 F (36.7 C) (Oral)  Resp 19  Ht 6' (1.829 m)  Wt 198 lb (89.812 kg)  BMI 26.85 kg/m2  SpO2 95%  General:  Frail elderly male, NAD, hard of hearing Eyes: left  Eye blind, chronic ENT: unremarkable Neck: supple, no JVD Cardiovascular: sinus bradycardia Respiratory: CTABL Abdomen: soft/ND/ND, positive bowel sounds Skin: no rash Musculoskeletal:  No edema Psychiatric: calm/cooperative Neurologic: no focal findings , aaox3          Labs on Admission:  Basic Metabolic Panel:  Recent Labs Lab 02/12/15 1058  NA 141  K 4.1  CL 103  CO2 27  GLUCOSE 111*  BUN 54*  CREATININE 2.53*  CALCIUM 9.2   Liver Function Tests:  Recent Labs Lab 02/12/15 1058  AST 18  ALT 13*  ALKPHOS 56  BILITOT 0.9  PROT 7.2  ALBUMIN 3.6   No results for input(s): LIPASE, AMYLASE in the last 168 hours. No results for input(s): AMMONIA in the last 168 hours. CBC:  Recent Labs Lab 02/12/15 1058  WBC 9.5  NEUTROABS 7.3  HGB 13.6  HCT 41.3  MCV 97.4  PLT 187   Cardiac Enzymes: No results for input(s): CKTOTAL, CKMB, CKMBINDEX, TROPONINI in the last 168 hours.  BNP (last 3 results)  Recent Labs  11/16/14 1942  BNP 598.4*    ProBNP (last 3 results) No results for input(s): PROBNP in the last 8760 hours.  CBG: No results for input(s): GLUCAP in the last 168 hours.  Radiological Exams on Admission: Dg Chest 2 View  02/12/2015  CLINICAL DATA:  Shortness of breath EXAM: CHEST  2 VIEW COMPARISON:  12/21/2014 FINDINGS:  No cardiomegaly when accounting for an apical fat pad. Stable aortic and hilar contours. There is no edema, consolidation, effusion, or pneumothorax. No acute osseous findings. IMPRESSION: No active cardiopulmonary disease. Electronically Signed   By: Marnee Spring M.D.   On: 02/12/2015 11:45   Ct Head Wo Contrast  02/12/2015  CLINICAL DATA:  Three-day history of  headache and dizziness EXAM: CT HEAD WITHOUT CONTRAST TECHNIQUE: Contiguous axial images were obtained from the base of the skull through the vertex without intravenous contrast. COMPARISON:  Head CT December 21, 2014; brain MRI December 21, 2014 FINDINGS: Mild to moderate diffuse atrophy remain stable. There is no intracranial mass hemorrhage, extra-axial fluid collection, or midline shift. There is evidence of a prior infarct in the proximal left parietal lobe, stable. There is a prior infarct in the posterior right parietal lobe, stable. There is moderate small vessel disease throughout the centra semiovale bilaterally. No new gray-white compartment lesions appreciable. No acute infarct is evident. The bony calvarium appears intact. The mastoid air cells are clear. No intraorbital lesions are identified. IMPRESSION: Prior infarcts, stable. Atrophy with small vessel disease, stable. No mass, hemorrhage, or acute appearing infarct. Electronically Signed   By: Bretta Bang III M.D.   On: 02/12/2015 11:51    EKG: Independently reviewed. Sinus bradycardia  Assessment/Plan Present on Admission:  . Hypoxia . ARF (acute renal failure) (HCC)  ARF on CKD III: from dehydration? Or from recent use of levaquin? Hold lasix, d/c voltaren. Renal dosing meds, ivf /renal US for now.  Sinus Bradycardia: could contribute to generalized weakness, dizziness. Per chart review, home meds bystolic was decreased to  po qd by his pmd due to bradycardia. Cardiology consulted for meds assistance.  paroxysmal afib: now bradycardia, has been on xarelto, cardiology consulted for advise on anticoagulation in the setting of arf.  HTN; continue hydralazine bid.  H/o recurrent dvt s/p ivc filter, xarelto dose per cardiology  H/o CVA: continue asa, statin, s/p recent CEA to carotid artery stenosis.  FTT/ generalized weakness/dizziness: will need pt eval.  Per EDP patient was found hypoxic on room air, vq scan  pending  DVT prophylaxis: scd  Consultants:  Cardiology for afib/bradycardia  Code Status: full , confirmed with patient  Family Communication:  Patient   Disposition Plan: admit to med tele  Time spent:  Jakell Trusty MD, PhD [facility] Pager [phone] If 7PM-7AM, please contact night-coverage at [email or web address], password [facility]

## 2015-02-12 NOTE — Progress Notes (Signed)
CRITICAL VALUE ALERT  Critical value received:  Positive MRSA PCR  Date of notification:  02/12/15  Time of notification:  1730  Critical value read back:Yes.    Nurse who received alert:  Ernst BreachMarissa Long, RN   MD notified (1st page):  Dr. Roda [email or web address]   Time of first page:  1735  MD notified (2nd page):  Time of second page:  Responding MD:  Dr. Roda [email or web address]   Time MD responded:  1745- Orders placed regarding contact precautions and MRSA protocol

## 2015-02-12 NOTE — Consult Note (Signed)
CARDIOLOGY CONSULT NOTE   Patient ID: DUFFY DANTONIO MRN: [id number] DOB/AGE: 79-Jul-1921 79 y.o.  Admit date: 02/12/2015  Primary Physician   Leo Grosser, MD Primary Cardiologist   Dr. Excell Seltzer Reason for Consultation   Bradycardia  HPI: JULIAS MOULD is a 81 y.o. male with a history of CAD s/p PCI to OM (2007), PVD, carotid artery disease s/p recent CEA, PAF on Xarelto, recurrent DVTs s/p IVC filter placement, orthostatic hypotensive on antihypertensives, CVA, CKD, and HLD who presented to [facility] today generalized weakness, sinus pressure, dizziness. He was found to have bradycardia and AKI and cardiology was consulted.   He was seen by his PCP recently and his Bystolic was decreased from 10mg  to 5mg  due to bradycardia.  He was at his assisted living facility and just not feeling well. He complained of toothache and sinus headache. He also had dizziness, which has been going on for years, by patient report. He has a long history of inner ear dysfunction and previous inner ear surgery. He denies chest pain or SOB. Apparently he had decreased UOP. He has chronic LE edema, which is unchanged.    Past Medical History  Diagnosis Date  . PVD (peripheral vascular disease) (HCC)   . CAD (coronary artery disease)     a. 12/2005 - PCI to OM  . Hyperlipidemia   . Cerebrovascular accident (HCC)   . 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 GERD (gastroesophageal reflux disease)   . Edema   . Vertigo   . IBS (irritable bowel syndrome)   . Duodenitis   . Gastritis   . Esophageal stricture   . Cellulitis   . Pulmonary embolism (HCC)   . Prostatic hypertrophy   . Frequent falls   . Subdural hematoma (HCC)   . PTSD (post-traumatic stress disorder)   . Vertigo   . TIA (transient ischemic attack)     a. 01/2006 and 05/2006.   [name] Hiatal hernia     periferal neuropathy  . Hx of echocardiogram     Echo 9/13:  Mild LVH, EF 60-65%, Gr 1  diast dysfn, mild AI, mild BAE  . Gout   . Chronic low back pain   . Gait disorder   . Peripheral neuropathy (HCC)   . Hydrocele   . History of shingles     Left lumbar  . HOH (hard of hearing)     hearing aid, totally in R ear ( no hearing aid in L )   . Myocardial infarction [facility] "years ago"    "dr said I'd had a small one"  . Daily headache   . Depression   . Urinary frequency   . Blind left eye   . Hypertension   . Dysrhythmia   . Arthritis   . Anxiety     PTSD     Past Surgical History  Procedure Laterality Date  . Hand surgery Right     "replaced bone in my finger"  . Angioplasty    . Carotid endarterectomy Left   . Coronary angioplasty with stent placement      left circumflex  . Cataract extraction, bilateral    . Tonsillectomy    . Tympanic membrane repair Right 1978    [name] 07/27/2010; "couldn'getting so t couldn't hear; had noise in my ear; didn't correct either  . Hand contracture release Right     palm/notes 07/27/2010  . Esophagogastroduodenoscopy (egd)  with esophageal dilation    . Insertion of vena cava filter  2013    h/o PE  . Endarterectomy Left 11/02/2014    Procedure: LEFT  CAROTID ARTERY ENDARTERECTOMY  WITH DACRON PATCH ANGIOPLASTY;  Surgeon: Sherren Kerns, MD;  Location: [facility] OR;  Service: Vascular;  Laterality: Left;    Allergies  Allergen Reactions  . Ativan [Lorazepam] Other (See Comments)    unknown  . Augmentin [Amoxicillin-Pot Clavulanate] Nausea And Vomiting    daughter states he can tolerate Amoxicillin ok Profuse vomiting. Unable to answer penicillin questions as pt is from Nursing home and unable to provide answers to questions  . Belladonna Alkaloids     Unknown- on MAR  . Corticosteroids     Unknown- on MAR  . Fludrocortisone Acetate Other (See Comments)    "leg swelling"  . Parabens     Unknown- on MAR  . Scopace [Scopolamine] Other (See Comments)    *patch "Made him crazy" per patients daughter     I have  reviewed the patient's current medications . sodium chloride   Intravenous Once  . [START ON 02/13/2015] aspirin EC  81 mg Oral Daily  . [START ON 02/13/2015] DULoxetine  30 mg Oral Daily  . latanoprost  1 drop Both Eyes QHS  . [START ON 02/13/2015] mirabegron ER  25 mg Oral Daily  . polyvinyl alcohol  2 drop Both Eyes TID  . simvastatin  40 mg Oral q1800  . sodium chloride  1 spray Each Nare BID  . sodium chloride  3 mL Intravenous Q12H  . tamsulosin  0.4 mg Oral Daily   . sodium chloride     ALPRAZolam, HYDROcodone-acetaminophen, meclizine, nitroGLYCERIN  Prior to Admission medications   Medication Sig Start Date End Date Taking? Authorizing Provider  acetaminophen (TYLENOL) 500 MG tablet Take 1,000 mg by mouth 2 (two) times daily. Take every day per [facility]   Yes Historical Provider, MD  ALPRAZolam (XANAX) 0.25 MG tablet Take 1 tablet (0.25 mg total) by mouth 2 (two) times daily as needed for anxiety. 12/23/14  Yes Shanker Levora Dredge, MD  alum & mag hydroxide-simeth (MAALOX/MYLANTA) 200-200-20 MG/5ML suspension Take 30 mLs by mouth every 6 (six) hours as needed for indigestion or heartburn.   Yes Historical Provider, MD  aspirin EC 81 MG EC tablet Take 1 tablet (81 mg total) by mouth daily. 12/23/14  Yes Shanker Levora Dredge, MD  DEXILANT 60 MG capsule TAKE ONE CAPSULE BY MOUTH EVERY DAY 03/16/14  Yes Donita Brooks, MD  diclofenac sodium (VOLTAREN) 1 % GEL Apply 2 g topically 4 (four) times daily. Patient taking differently: Apply 2 g topically every 6 (six) hours as needed (pain).  06/22/14  Yes Donita Brooks, MD  docusate sodium (COLACE) 100 MG capsule Take 1 capsule (100 mg total) by mouth at bedtime. 07/14/14  Yes Donita Brooks, MD  DULoxetine (CYMBALTA) 30 MG capsule TAKE ONE CAPSULE BY MOUTH EVERY DAY 09/07/14  Yes York Spaniel, MD  furosemide (LASIX) 40 MG tablet Take 40 mg by mouth daily.   Yes Historical Provider, MD  hydrALAZINE (APRESOLINE) 25 MG tablet Take 25 mg by mouth 2  (two) times daily.   Yes Historical Provider, MD  HYDROcodone-acetaminophen (NORCO/VICODIN) 5-325 MG tablet Take 1 tablet by mouth every 6 (six) hours as needed for moderate pain.   Yes Historical Provider, MD  latanoprost (XALATAN) 0.005 % ophthalmic solution Place 1 drop into both eyes at bedtime.   Yes Historical  Provider, MD  levofloxacin (LEVAQUIN) 500 MG tablet Take 1 tablet (500 mg total) by mouth daily. 02/09/15  Yes Salley Scarlet, MD  loperamide (IMODIUM) 2 MG capsule Take 2 mg by mouth as needed for diarrhea or loose stools (Not to exceed 8 doses in 24 hours).   Yes Historical Provider, MD  magnesium hydroxide (MILK OF MAGNESIA) 400 MG/5ML suspension Take 30 mLs by mouth at bedtime as needed for mild constipation.   Yes Historical Provider, MD  meclizine (ANTIVERT) 12.5 MG tablet Take 12.5 mg by mouth every 12 (twelve) hours as needed for dizziness.   Yes Historical Provider, MD  MYRBETRIQ 25 MG TB24 tablet TAKE 1 TABLET BY MOUTH EVERY DAY 07/13/14  Yes Donita Brooks, MD  nebivolol (BYSTOLIC) 5 MG tablet Take 1 tablet (5 mg total) by mouth daily. 01/12/15  Yes Donita Brooks, MD  nitroGLYCERIN (NITROSTAT) 0.4 MG SL tablet Place 0.4 mg under the tongue every 5 (five) minutes as needed. Chest pain   Yes Historical Provider, MD  ondansetron (ZOFRAN) 4 MG tablet Take 4 mg by mouth every 8 (eight) hours as needed for nausea.   Yes Historical Provider, MD  polyethylene glycol powder (GLYCOLAX/MIRALAX) powder MIX 17 GRAMS WITH 4 TO 6 OUNCES OF WATER AND DRINK ONCE DAILY AS NEEDED FOR CONSTIPATION 03/07/13  Yes Donita Brooks, MD  polyvinyl alcohol (LIQUIFILM TEARS) 1.4 % ophthalmic solution Place 2 drops into both eyes 3 (three) times daily.   Yes Historical Provider, MD  predniSONE (DELTASONE) 10 MG tablet Take 1 tablet (10 mg total) by mouth daily with breakfast. 09/22/14  Yes Donita Brooks, MD  simvastatin (ZOCOR) 40 MG tablet Take 1 tablet (40 mg total) by mouth daily at 6 PM. 12/23/14   Yes Shanker Levora Dredge, MD  sodium chloride (OCEAN) 0.65 % SOLN nasal spray Place 1 spray into both nostrils 2 (two) times daily. For 1 week 02/09/15  Yes Salley Scarlet, MD  tamsulosin (FLOMAX) 0.4 MG CAPS capsule Take 0.4 mg by mouth daily.   Yes Historical Provider, MD  XARELTO 15 MG TABS tablet TAKE 1 TABLET BY MOUTH EVERY DAY WITH SUPPER 10/06/14  Yes Tonny Bollman, MD     Social History   Social History  . Marital Status: Married    Spouse Name: N/A  . Number of Children: 2  . Years of Education: HS   Occupational History  . retired    Social History Main Topics  . Smoking status: Never Smoker   . Smokeless tobacco: Never Used  . Alcohol Use: 0.0 oz/week    0 Standard drinks or equivalent per week     Comment: wine occasionally  . Drug Use: No  . Sexual Activity: No   Other Topics Concern  . 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   Patient drinks about 2 cups of caffeine daily.    Family Status  Relation Status Death Age  . Brother Alive   . Father Deceased   . Mother Deceased     unknown  . Sister Alive   . Sister Deceased   . Sister Deceased   . Brother Deceased   . Brother Deceased     substance abuse  . Brother Deceased     infant  . Brother Deceased   . Maternal Grandmother Deceased   . Maternal Grandfather Deceased   .  Paternal Grandmother Deceased   . Paternal Grandfather Deceased    Family History  Problem Relation Age of Onset  . Colon cancer Neg Hx   . Coronary artery disease Brother   . Heart attack Father   . Migraines Mother   . Breast cancer Sister   . Dementia Sister   . Renal Disease Brother      ROS:  Full 14 point review of systems complete and found to be negative unless listed above.  Physical Exam: Blood pressure 159/77, pulse 51, temperature 97.2 F (36.2 C), temperature source Oral, resp. rate 18, height 6' (1.829 m), weight 198  lb (89.812 kg), SpO2 96 %.  General: Well developed, well nourished, male in no acute distress Head: Eyes PERRLA, No xanthomas.   Normocephalic and atraumatic, oropharynx without edema or exudate. Lungs: Ctab Heart: HRRR S1 S2, no rub/gallop, Heart regular rate and rhythm with S1, S2  murmur. pulses are 2+ extrem.  Neck: No carotid bruits. No lymphadenopathy. No JVD. Abdomen: Bowel sounds present, abdomen soft and non-tender without masses or hernias noted. Msk:  No spine or cva tenderness. No weakness, no joint deformities or effusions. Extremities: No clubbing or cyanosis.  1+ bilateral edema.  Neuro: Alert and oriented X 3. No focal deficits noted. Psych:  Good affect, responds appropriately Skin: No rashes or lesions noted.  Labs:   Lab Results  Component Value Date   WBC 9.5 02/12/2015   HGB 13.6 02/12/2015   HCT 41.3 02/12/2015   MCV 97.4 02/12/2015   PLT 187 02/12/2015   No results for input(s): INR in the last 72 hours.  Recent Labs Lab 02/12/15 1058  NA 141  K 4.1  CL 103  CO2 27  BUN 54*  CREATININE 2.53*  CALCIUM 9.2  PROT 7.2  BILITOT 0.9  ALKPHOS 56  ALT 13*  AST 18  GLUCOSE 111*  ALBUMIN 3.6   MAGNESIUM  Date Value Ref Range Status  05/22/2014 1.9 1.5 - 2.5 mg/dL Final   No results for input(s): CKTOTAL, CKMB, TROPONINI in the last 72 hours.  Recent Labs  02/12/15 1104  TROPIPOC 0.01   PRO B NATRIURETIC PEPTIDE (BNP)  Date/Time Value Ref Range Status  12/16/2012 02:28 PM 639.7* 0 - 450 pg/mL Final  11/13/2010 07:00 PM 209.8 0 - 450 pg/mL Final   Lab Results  Component Value Date   CHOL 233* 12/22/2014   HDL 45 12/22/2014   LDLCALC 159* 12/22/2014   TRIG 146 12/22/2014   Lab Results  Component Value Date   DDIMER 0.73* 11/13/2010   LIPASE  Date/Time Value Ref Range Status  07/26/2012 04:35 PM 30 11 - 59 U/L Final   AMYLASE  Date/Time Value Ref Range Status  05/28/2007 04:23 PM 64 27-131 units/L Final   TSH  Date/Time Value Ref  Range Status  11/16/2014 07:43 PM 1.574 0.350 - 4.500 uIU/mL Final  01/13/2014 02:39 PM 4.174 0.350 - 4.500 uIU/mL Final   No results found for: VITAMINB12, FOLATE, FERRITIN, TIBC, IRON, RETICCTPCT  Echo:  11/17/2014 LV EF: 50% -  55% Study Conclusions - Left ventricle: The cavity size was normal. There was mild focal basal hypertrophy of the septum. Systolic function was normal. The estimated ejection fraction was in the range of 50% to 55%. Wall motion was normal; there were no regional wall motion abnormalities. Doppler parameters are consistent with abnormal left ventricular relaxation (grade 1 diastolic dysfunction). - Aortic valve: Trileaflet; moderately thickened, moderately calcified leaflets. There was trivial regurgitation. -  Left atrium: The atrium was moderately dilated. - Pulmonic valve: There was moderate regurgitation. - Pulmonary arteries: Systolic pressure was mildly increased. PA peak pressure: 38 mm Hg (S). - Pericardium, extracardiac: A trivial pericardial effusion was identified posterior to the heart. Impressions: - No cardiac source of emboli was indentified. Compared to the prior study, there has been no significant interval change.  ECG:  HR 67 Sinus rhythm with PAC,  Right bundle branch block  Radiology:  Dg Chest 2 View  02/12/2015  CLINICAL DATA:  Shortness of breath EXAM: CHEST  2 VIEW COMPARISON:  12/21/2014 FINDINGS: No cardiomegaly when accounting for an apical fat pad. Stable aortic and hilar contours. There is no edema, consolidation, effusion, or pneumothorax. No acute osseous findings. IMPRESSION: No active cardiopulmonary disease. Electronically Signed   By: Marnee Spring M.D.   On: 02/12/2015 11:45   Ct Head Wo Contrast  02/12/2015  CLINICAL DATA:  Three-day history of headache and dizziness EXAM: CT HEAD WITHOUT CONTRAST TECHNIQUE: Contiguous axial images were obtained from the base of the skull through the vertex without  intravenous contrast. COMPARISON:  Head CT December 21, 2014; brain MRI December 21, 2014 FINDINGS: Mild to moderate diffuse atrophy remain stable. There is no intracranial mass hemorrhage, extra-axial fluid collection, or midline shift. There is evidence of a prior infarct in the proximal left parietal lobe, stable. There is a prior infarct in the posterior right parietal lobe, stable. There is moderate small vessel disease throughout the centra semiovale bilaterally. No new gray-white compartment lesions appreciable. No acute infarct is evident. The bony calvarium appears intact. The mastoid air cells are clear. No intraorbital lesions are identified. IMPRESSION: Prior infarcts, stable. Atrophy with small vessel disease, stable. No mass, hemorrhage, or acute appearing infarct. Electronically Signed   By: Bretta Bang III M.D.   On: 02/12/2015 11:51    ASSESSMENT AND PLAN:    Active Problems:   ARF (acute renal failure) (HCC)   Hypoxia  ARN MCOMBER is a 81 y.o. male with a history of CAD s/p PCI to OM (2007), PVD, carotid artery disease s/p recent CEA, PAF on Xarelto, recurrent DVTs s/p IVC filter placement, orthostatic hypotensive on antihypertensives, CVA, CKD, and HLD who presented to [facility] today generalized weakness, sinus pressure, dizziness. He was found to have bradycardia and AKI and cardiology was consulted.   Bradycardia- Tele with intermittent HRs in high 40s. This is not new.  Do not think symptoms are related to bradycardia, but we can decrease Bisoprolol to 2.5mg  and see if fatigue and weakness improve. Continue to monitor.   PAF- maintaining NSR with some bradycardia. Decrease bystolic as above. Continue renally dosed Xarelto at  ( his creat clearance is 23 ) .    CAD, native vessel, without symptoms of angina: appears to have stable CAD  -- Continue ASA and BB.   HTN, moderate control: problems in past with orthostasis. Continue Bystolic 2.5mg  as above. Last time he was seen  by Dr. Excell Seltzer he was on amlodipine , this can be resumed as well  Acute on chronic CKD- probably due to dehydration with increase BUN/creat and decreased UOP. Continue IVFs.  Hypoxia- undergoing V/Q scan now but doubt PE on chronic Xarelto.   We will sign off, please call us if you have any other issues.   SignedAllena Katz 02/12/2015 3:31 PM  Pager [phone]  Co-Sign MD  Patient seen and examined and history reviewed. Agree with above findings and plan. Patient  presents with generalized weakness, sinus congestion. He has chronic bradycardia. Bystolic recently reduced to 5 mg daily. HR on exam in low 60s. Previously down to upper 40s. Patient reports his HR is always like this. I don't think his bradycardia has anything to do with his presentation. Will reduce Bystolic. Would continue other cardiac therapy. Currently having a V/Q scan done.   Melquiades Kovar [place], MDFACC 02/12/2015 4:33 PM

## 2015-02-12 NOTE — ED Notes (Signed)
[facility] for complaint of nausea, dry mouth, and not feeling good. Facility reports new diagnosis of sinus infection and poor kidney output.

## 2015-02-13 ENCOUNTER — Inpatient Hospital Stay (HOSPITAL_COMMUNITY): Payer: Medicare Other

## 2015-02-13 DIAGNOSIS — F411 Generalized anxiety disorder: Secondary | ICD-10-CM

## 2015-02-13 LAB — BASIC METABOLIC PANEL
Anion gap: 8 (ref 5–15)
BUN: 47 mg/dL — ABNORMAL HIGH (ref 6–20)
CALCIUM: 8.6 mg/dL — AB (ref 8.9–10.3)
CO2: 28 mmol/L (ref 22–32)
CREATININE: 2.13 mg/dL — AB (ref 0.61–1.24)
Chloride: 105 mmol/L (ref 101–111)
GFR, EST AFRICAN AMERICAN: 29 mL/min — AB (ref >60)
GFR, EST NON AFRICAN AMERICAN: 25 mL/min — AB (ref >60)
Glucose, Bld: 114 mg/dL — ABNORMAL HIGH (ref 65–99)
Potassium: 3.8 mmol/L (ref 3.5–5.1)
SODIUM: 141 mmol/L (ref 135–145)

## 2015-02-13 MED ORDER — PANTOPRAZOLE SODIUM 20 MG PO TBEC
20.0000 mg | DELAYED_RELEASE_TABLET | Freq: Every day | ORAL | Status: DC
  Administered 2015-02-13 – 2015-02-14 (×2): 20 mg via ORAL
  Filled 2015-02-13 (×2): qty 1

## 2015-02-13 MED ORDER — FINASTERIDE 5 MG PO TABS
5.0000 mg | ORAL_TABLET | Freq: Every day | ORAL | Status: DC
  Administered 2015-02-13 – 2015-02-14 (×2): 5 mg via ORAL
  Filled 2015-02-13 (×2): qty 1

## 2015-02-13 MED ORDER — TAMSULOSIN HCL 0.4 MG PO CAPS
0.8000 mg | ORAL_CAPSULE | Freq: Every day | ORAL | Status: DC
  Administered 2015-02-14: 0.8 mg via ORAL
  Filled 2015-02-13: qty 2

## 2015-02-13 MED ORDER — PREDNISONE 10 MG PO TABS
10.0000 mg | ORAL_TABLET | Freq: Every day | ORAL | Status: DC
  Administered 2015-02-14: 10 mg via ORAL
  Filled 2015-02-13: qty 1

## 2015-02-13 MED ORDER — FAMOTIDINE 20 MG PO TABS
20.0000 mg | ORAL_TABLET | Freq: Two times a day (BID) | ORAL | Status: DC
  Administered 2015-02-13 – 2015-02-14 (×2): 20 mg via ORAL
  Filled 2015-02-13 (×2): qty 1

## 2015-02-13 MED ORDER — SENNOSIDES-DOCUSATE SODIUM 8.6-50 MG PO TABS
1.0000 | ORAL_TABLET | Freq: Two times a day (BID) | ORAL | Status: DC
  Administered 2015-02-13 – 2015-02-14 (×3): 1 via ORAL
  Filled 2015-02-13 (×3): qty 1

## 2015-02-13 MED ORDER — PHENOL 1.4 % MT LIQD
1.0000 | OROMUCOSAL | Status: DC | PRN
  Filled 2015-02-13: qty 177

## 2015-02-13 MED ORDER — HYDRALAZINE HCL 50 MG PO TABS
50.0000 mg | ORAL_TABLET | Freq: Two times a day (BID) | ORAL | Status: DC
  Administered 2015-02-13 – 2015-02-14 (×2): 50 mg via ORAL
  Filled 2015-02-13 (×2): qty 1

## 2015-02-13 MED ORDER — ACETAMINOPHEN 500 MG PO TABS
1000.0000 mg | ORAL_TABLET | Freq: Two times a day (BID) | ORAL | Status: DC
  Administered 2015-02-13 – 2015-02-14 (×3): 1000 mg via ORAL
  Filled 2015-02-13 (×4): qty 2

## 2015-02-13 MED ORDER — ALBUTEROL SULFATE (2.5 MG/3ML) 0.083% IN NEBU: 2.5000 mg | INHALATION_SOLUTION | Freq: Four times a day (QID) | RESPIRATORY_TRACT | Status: DC | PRN

## 2015-02-13 NOTE — Progress Notes (Signed)
Notified MD of patient having a shortness of breath episode. BP 161/78, HR 62, crackles on auscultation, and 99% on 2L. Orders to turn down fluids received. Patient requested that son be called to notify him. Genelle BalBrett (son) called) and notified, on his way [place] from [place]. Will continue to closely monitor patient. Setzer, Don BroachAllison Marie

## 2015-02-13 NOTE — Progress Notes (Signed)
PROGRESS NOTE  Jason [name] WGN:[id number] DOB: 06/29/1919 DOA: 02/12/2015 PCP: Leo GrosserPICKARD,[name], MD  HPI/Recap of past 24 hours:  Reported feeling better, reported he was recently started on lasix for chf, reported h/o urinary hesitance,  Later he had one episode of feeling sob, very anxious.  Assessment/Plan: Active Problems:   ARF (acute renal failure) (HCC)   Hypoxia  ARF on CKD III:  from dehydration? Or from recent use of levaquin? Hold lasix, d/c voltaren.  Renal dosing meds,  renal us : no hydro, Progressive cortical thinning and decreased renal size compared with prior study of 9 months ago.  Received hydration briefly, cr improving, d/c ivf, continue hold lasix, repeat bmp in am  Sinus Bradycardia:  could contribute to generalized weakness, dizziness.  Per chart review, home meds bystolic was decreased to 5mg  po qd  Recently by his pmd due to bradycardia.  Cardiology recommended to decrease bystolic to 2.5mg  po qd.  paroxysmal afib: sinus bradycardia in the hospital, continue renal dosing xarelto, decrease bystolic dose to 2.5 qd per cardiology recommendation.  HTN; bystolic dose decreased due to bradycardia, titrate up hydralazine  To 50mg  bid.  H/o recurrent dvt s/p ivc filter,  Renal dosing xarelto  per pharmacy/cardiology  H/o CVA: continue asa, statin, s/p recent CEA to carotid artery stenosis.  FTT/ generalized weakness/dizziness: pt eval pending  Per EDP patient was found hypoxic on room air, vq scan negative, cxr on acute findings, will walk patient and check oxygen saturation.  Arthritis: has been on tylenol and daily prednisone for a year, family reported arthritic pain well controlled on this regimen.  Anxiety/h/o PTSD: continue prn xanxex. Sob episode this afternoon, likely due to anxiety, cxr/tele unremarkable.  Urinary retention: will increase flomax dose, add proscar, check psa level. Will hold myrbetriq for now.  Family reported patient has  been on myrbetriq and flomax, seem has achieved a good balance between urinary urgency and urinary retention. Likely will restart myrbetriq at discharge.  DVT prophylaxis: no xarelto  Consultants:  Cardiology for afib/bradycardia  Code Status: Full  Family Communication: patient in room, son and daughter over the phone  Disposition Plan: d/c to SNF , likely 12/4    Procedures:  none  Antibiotics:  none   Objective: BP 176/66 mmHg  Pulse 59  Temp(Src) 97.8 F (36.6 C) (Oral)  Resp 22  Ht 6' (1.829 m)  Wt 198 lb (89.812 kg)  BMI 26.85 kg/m2  SpO2 99%  Intake/Output Summary (Last 24 hours) at 02/13/15 1537 Last data filed at 02/13/15 0900  Gross per 24 hour  Intake  627.5 ml  Output   1152 ml  Net -524.5 ml   Filed Weights   02/12/15 1019  Weight: 198 lb (89.812 kg)    Exam:   General:  anxious  Cardiovascular: sinus bradycardia  Respiratory: CTABL  Abdomen: Soft/ND/NT, positive BS  Musculoskeletal: No Edema  Neuro: aaox3, hard of hearing, chronic left eye blind.  Data Reviewed: Basic Metabolic Panel:  Recent Labs Lab 02/12/15 1058 02/13/15 0911  NA 141 141  K 4.1 3.8  CL 103 105  CO2 27 28  GLUCOSE 111* 114*  BUN 54* 47*  CREATININE 2.53* 2.13*  CALCIUM 9.2 8.6*   Liver Function Tests:  Recent Labs Lab 02/12/15 1058  AST 18  ALT 13*  ALKPHOS 56  BILITOT 0.9  PROT 7.2  ALBUMIN 3.6   No results for input(s): LIPASE, AMYLASE in the last 168 hours. No results for input(s): AMMONIA  in the last 168 hours. CBC:  Recent Labs Lab 02/12/15 1058  WBC 9.5  NEUTROABS 7.3  HGB 13.6  HCT 41.3  MCV 97.4  PLT 187   Cardiac Enzymes:   No results for input(s): CKTOTAL, CKMB, CKMBINDEX, TROPONINI in the last 168 hours. BNP (last 3 results)  Recent Labs  11/16/14 1942  BNP 598.4*    ProBNP (last 3 results) No results for input(s): PROBNP in the last 8760 hours.  CBG: No results for input(s): GLUCAP in the last 168  hours.  Recent Results (from the past 240 hour(s))  MRSA PCR Screening     Status: Abnormal   Collection Time: 02/12/15  3:16 PM  Result Value Ref Range Status   MRSA by PCR POSITIVE (A) NEGATIVE Final    Comment:        The GeneXpert MRSA Assay (FDA approved for NASAL specimens only), is one component of a comprehensive MRSA colonization surveillance program. It is not intended to diagnose MRSA infection nor to guide or monitor treatment for MRSA infections. RESULT CALLED TO, READ BACK BY AND VERIFIED WITH: M.LONG RN AT 1733 ON 12.2.16 BY W.BUCHHOLZ.      Studies: Dg Chest 2 View  02/13/2015  CLINICAL DATA:  Shortness of breath EXAM: CHEST  2 VIEW COMPARISON:  Chest radiograph from one day prior. FINDINGS: Stable cardiomediastinal silhouette with normal heart size. No pneumothorax. No pleural effusion. Clear lungs, with no focal lung consolidation and no pulmonary edema. Partially visualized is an IVC filter in the posterior abdomen on the lateral view. IMPRESSION: No active cardiopulmonary disease. Electronically Signed   By: Delbert Phenix M.D.   On: 02/13/2015 12:17   US Renal  02/12/2015  CLINICAL DATA:  Acute renal failure.  History of hypertension. EXAM: RENAL / URINARY TRACT ULTRASOUND COMPLETE COMPARISON:  Renal ultrasound 05/24/2014. FINDINGS: Right Kidney: Length: 10.9 cm. There is renal cortical thinning and increased echogenicity with prominence of the renal sinus fat. No hydronephrosis or focal cortical abnormality. Left Kidney: Length: 8.9 cm. There is renal cortical thinning and increased echogenicity with prominence of the renal sinus fat. No hydronephrosis or focal cortical abnormality. Bladder: Appears normal for degree of bladder distention. IMPRESSION: 1. Progressive cortical thinning and decreased renal size compared with prior study of 9 months ago. Previous renal measurements 11.4 cm, and 9.7 cm, respectively. 2. No hydronephrosis. Electronically Signed   By: Carey Bullocks M.D.   On: 02/12/2015 18:58   Nm Pulmonary Perf And Vent  02/12/2015  CLINICAL DATA:  Inpatient. Several months of intermittent shortness of breath. Hypoxia. History of DVT and pulmonary embolism. EXAM: NUCLEAR MEDICINE VENTILATION - PERFUSION LUNG SCAN TECHNIQUE: Ventilation images were obtained in multiple projections using inhaled aerosol Tc-1m DTPA. Perfusion images were obtained in multiple projections after intravenous injection of Tc-5m MAA. RADIOPHARMACEUTICALS:  31 mCi Technetium-61m DTPA aerosol inhalation and 4.1 mCi Technetium-31m MAA IV COMPARISON:  05/22/2014 V/Q scan. Chest radiograph from earlier today. FINDINGS: Ventilation: No focal ventilation defect. Central airway radiotracer accumulation and mildly heterogeneous ventilation throughout both lungs suggests nonspecific airways disease. Perfusion: No wedge shaped peripheral perfusion defects to suggest acute pulmonary embolism. IMPRESSION: Very low probability for pulmonary embolism. Electronically Signed   By: Delbert Phenix M.D.   On: 02/12/2015 16:47    Scheduled Meds: . acetaminophen  1,000 mg Oral BID  . aspirin EC  81 mg Oral Daily  . Chlorhexidine Gluconate Cloth  6 each Topical Q0600  . DULoxetine  30 mg Oral Daily  .  famotidine  20 mg Oral BID  . finasteride  5 mg Oral Daily  . hydrALAZINE  50 mg Oral BID  . latanoprost  1 drop Both Eyes QHS  . mupirocin ointment   Nasal BID  . nebivolol  2.5 mg Oral Daily  . pantoprazole  20 mg Oral Daily  . polyvinyl alcohol  2 drop Both Eyes TID  . [START ON 02/14/2015] predniSONE  10 mg Oral Q breakfast  . rivaroxaban  15 mg Oral Q supper  . senna-docusate  1 tablet Oral BID  . simvastatin  40 mg Oral q1800  . sodium chloride  1 spray Each Nare BID  . sodium chloride  3 mL Intravenous Q12H  . [START ON 02/14/2015] tamsulosin  0.8 mg Oral Daily    Continuous Infusions:     Time spent:  Janiah Devinney MD, PhD  [facility] Pager [phone]. If 7PM-7AM, please  contact night-coverage at [email or web address], password [facility] 02/13/2015, 3:37 PM  LOS: 1 day

## 2015-02-13 NOTE — Progress Notes (Signed)
Report received from A. Helsabeck,RN. No change in assessment. Jason [name] 

## 2015-02-13 NOTE — Progress Notes (Signed)
SATURATION QUALIFICATIONS: (This note is used to comply with regulatory documentation for home oxygen)  Patient Saturations on Room Air at Rest = 96%  Patient Saturations on Room Air while Ambulating = 96%   

## 2015-02-14 DIAGNOSIS — I10 Essential (primary) hypertension: Secondary | ICD-10-CM

## 2015-02-14 LAB — BASIC METABOLIC PANEL
ANION GAP: 7 (ref 5–15)
BUN: 38 mg/dL — ABNORMAL HIGH (ref 6–20)
CALCIUM: 8.5 mg/dL — AB (ref 8.9–10.3)
CO2: 23 mmol/L (ref 22–32)
Chloride: 110 mmol/L (ref 101–111)
Creatinine, Ser: 1.8 mg/dL — ABNORMAL HIGH (ref 0.61–1.24)
GFR calc Af Amer: 36 mL/min — ABNORMAL LOW (ref >60)
GFR calc non Af Amer: 31 mL/min — ABNORMAL LOW (ref >60)
GLUCOSE: 91 mg/dL (ref 65–99)
Potassium: 3.6 mmol/L (ref 3.5–5.1)
Sodium: 140 mmol/L (ref 135–145)

## 2015-02-14 LAB — PSA: PSA: 1 ng/mL (ref 0.00–4.00)

## 2015-02-14 MED ORDER — FAMOTIDINE 20 MG PO TABS: 20.0000 mg | ORAL_TABLET | Freq: Two times a day (BID) | ORAL | Status: DC

## 2015-02-14 MED ORDER — HYDROCODONE-ACETAMINOPHEN 5-325 MG PO TABS: 1.0000 | ORAL_TABLET | Freq: Four times a day (QID) | ORAL | Status: DC | PRN

## 2015-02-14 MED ORDER — CHLORHEXIDINE GLUCONATE CLOTH 2 % EX PADS: 6.0000 | MEDICATED_PAD | Freq: Every day | CUTANEOUS | Status: AC

## 2015-02-14 MED ORDER — FINASTERIDE 5 MG PO TABS: 5.0000 mg | ORAL_TABLET | Freq: Every day | ORAL | Status: AC

## 2015-02-14 MED ORDER — ALPRAZOLAM 0.25 MG PO TABS: 0.2500 mg | ORAL_TABLET | Freq: Two times a day (BID) | ORAL | Status: DC | PRN

## 2015-02-14 MED ORDER — MUPIROCIN 2 % EX OINT: TOPICAL_OINTMENT | Freq: Two times a day (BID) | CUTANEOUS | Status: DC

## 2015-02-14 MED ORDER — NEBIVOLOL HCL 2.5 MG PO TABS: 2.5000 mg | ORAL_TABLET | Freq: Every day | ORAL | Status: AC

## 2015-02-14 MED ORDER — SENNOSIDES-DOCUSATE SODIUM 8.6-50 MG PO TABS: 1.0000 | ORAL_TABLET | Freq: Every evening | ORAL | Status: DC | PRN

## 2015-02-14 MED ORDER — TAMSULOSIN HCL 0.4 MG PO CAPS: 0.8000 mg | ORAL_CAPSULE | Freq: Every day | ORAL | Status: DC

## 2015-02-14 MED ORDER — SENNOSIDES-DOCUSATE SODIUM 8.6-50 MG PO TABS: 1.0000 | ORAL_TABLET | Freq: Every evening | ORAL | Status: AC | PRN

## 2015-02-14 MED ORDER — FUROSEMIDE 40 MG PO TABS: 40.0000 mg | ORAL_TABLET | ORAL | Status: DC

## 2015-02-14 MED ORDER — HYDRALAZINE HCL 50 MG PO TABS: 50.0000 mg | ORAL_TABLET | Freq: Three times a day (TID) | ORAL | Status: DC

## 2015-02-14 NOTE — Discharge Summary (Addendum)
Discharge Summary  Jason [name]:[id number] DOB: 11/26/1919  PCP: Jason Grosser, MD  Admit date: 02/12/2015 Discharge date: 02/14/2015  Time spent: >86mins  Recommendations for Outpatient Follow-up:  1. F/u with PMD within a week, repeat bmp at follow up to monitor creatinine level  Discharge Diagnoses:  Active Hospital Problems   Diagnosis Date Noted  . Hypoxia 02/12/2015  . ARF (acute renal failure) (HCC) 12/05/2011    Resolved Hospital Problems   Diagnosis Date Noted Date Resolved  No resolved problems to display.    Discharge Condition: stable  Diet recommendation: heart healthy  Filed Weights   02/12/15 1019  Weight: 198 lb (89.812 kg)    History of present illness:  Jason [name] is a 81 y.o. male   With h/o paroxysmal afib on xarelto, h/o recurrent dvt, s/p ivc filter, h/o carotid stenosis s/p CEA in 2016, h/o recent cva with slurred speech ( resolved), he was sent from ALF due to c/o not feeling well, generalized weakness, decreased urine output. He reported recently was treated for sinusitis with levaquin, currently he denies fever, no headache, no cough, no sob, no n/v, no diarrhea, ED course: ct head no acute findings, cxr unremarkable, ua no infection, labs showed elevated cr at 2.53 (baseline 1.4 to 1.6). Tele showed bradycardia.  Hospital Course:  Active Problems:   ARF (acute renal failure) (HCC)   Hypoxia  ARF on CKD III: from dehydration? Or from recent use of levaquin? Hold lasix, d/c voltaren.  Renal dosing meds,  renal US : no hydro, Progressive cortical thinning and decreased renal size compared with prior study of 9 months ago.  Received hydration briefly, cr improving, d/c ivf,  Restarted lasix at decreased frequency at discharge. Was on 40mg  daily, changed to 40mg  every other day.  Sinus Bradycardia:  could contribute to generalized weakness, dizziness.  Per chart review, home meds bystolic was decreased to 5mg  po qd Recently  by his pmd due to bradycardia.  Cardiology recommended to decrease bystolic to 2.5mg  po qd.  paroxysmal afib: sinus bradycardia in the hospital, continue renal dosing xarelto, decrease bystolic dose to 2.5 qd per cardiology recommendation.  HTN; bystolic dose decreased due to bradycardia, titrate up hydralazine To 50mg  tid.  H/o recurrent dvt s/p ivc filter, Renal dosing xarelto per pharmacy/cardiology  H/o CVA: continue asa, statin, s/p recent CEA to carotid artery stenosis.  FTT/ generalized weakness/dizziness: pt eval recommended HHPT.  Per EDP patient was found hypoxic on room air, vq scan negative, cxr on acute findings,  oxygen saturation wnl while walking on room air.  Arthritis: has been on tylenol and daily prednisone for a year, family reported arthritic pain well controlled on this regimen.  Anxiety/h/o PTSD: continue prn xanxex. Sob episode this afternoon, likely due to anxiety, cxr/tele unremarkable.  Urinary retention and urinary urgency: family reported these have been a chronic problem and he is closed followed with his urologist Dr. Retta [name].  increase flomax dose, add proscar,  psa level wnl. myrbetriq held in the hospital due to main complain here is difficulty initiate urine.  Family reported patient has been on myrbetriq and flomax, seem has achieved a good balance between urinary urgency and urinary retention. restart myrbetriq at discharge.  MRSA colonization: decolonization with bactroban nasal cream and chlorhexidine cloth.  Family reported patient is going to have a teeth pulled in the near further, they are aware of the need to coordination with dentist about xarelto prior to procedure.  DVT prophylaxis: on xarelto  Consultants:  Cardiology for afib/bradycardia  Code Status: Full  Family Communication: patient and son in room, daughter over the phone  Disposition Plan: d/c to ALF with HHPT on  12/4    Procedures:  none  Antibiotics:  none   Discharge Exam: BP 174/68 mmHg  Pulse 61  Temp(Src) 98 F (36.7 C) (Oral)  Resp 18  Ht 6' (1.829 m)  Wt 198 lb (89.812 kg)  BMI 26.85 kg/m2  SpO2 97%   General: anxious  Cardiovascular: sinus bradycardia  Respiratory: CTABL  Abdomen: Soft/ND/NT, positive BS  Musculoskeletal: No Edema  Neuro: aaox3, hard of hearing, chronic left eye blind.   Discharge Instructions You were cared for by a hospitalist during your hospital stay. If you have any questions about your discharge medications or the care you received while you were in the hospital after you are discharged, you can call the unit and asked to speak with the hospitalist on call if the hospitalist that took care of you is not available. Once you are discharged, your primary care physician will handle any further medical issues. Please note that NO REFILLS for any discharge medications will be authorized once you are discharged, as it is imperative that you return to your primary care physician (or establish a relationship with a primary care physician if you do not have one) for your aftercare needs so that they can reassess your need for medications and monitor your lab values.      Discharge Instructions    Diet - low sodium heart healthy    Complete by:  As directed      Face-to-face encounter (required for Medicare/Medicaid patients)    Complete by:  As directed   I Jason [name] certify that this patient is under my care and that I, or a nurse practitioner or physician's assistant working with me, had a face-to-face encounter that meets the physician face-to-face encounter requirements with this patient on 02/14/2015. The encounter with the patient was in whole, or in part for the following medical condition(s) which is the primary reason for home health care (List medical condition): FTT  The encounter with the patient was in whole, or in part, for the following  medical condition, which is the primary reason for home health care:  FTT  I certify that, based on my findings, the following services are medically necessary home health services:  Physical therapy  Reason for Medically Necessary Home Health Services:  Skilled Nursing- Change/Decline in Patient Status  My clinical findings support the need for the above services:  Unsafe ambulation due to balance issues  Further, I certify that my clinical findings support that this patient is homebound due to:  Unsafe ambulation due to balance issues     Home Health    Complete by:  As directed   To provide the following care/treatments:  PT     Increase activity slowly    Complete by:  As directed             Medication List    STOP taking these medications        diclofenac sodium 1 % Gel  Commonly known as:  VOLTAREN     levofloxacin 500 MG tablet  Commonly known as:  LEVAQUIN      TAKE these medications        acetaminophen 500 MG tablet  Commonly known as:  TYLENOL  Take 1,000 mg by mouth 2 (two) times daily. Take every day per [facility]  ALPRAZolam 0.25 MG tablet  Commonly known as:  XANAX  Take 1 tablet (0.25 mg total) by mouth 2 (two) times daily as needed for anxiety.     alum & mag hydroxide-simeth 200-200-20 MG/5ML suspension  Commonly known as:  MAALOX/MYLANTA  Take 30 mLs by mouth every 6 (six) hours as needed for indigestion or heartburn.     aspirin 81 MG EC tablet  Take 1 tablet (81 mg total) by mouth daily.     Chlorhexidine Gluconate Cloth 2 % Pads  Apply 6 each topically daily at 6 (six) AM.     DEXILANT 60 MG capsule  Generic drug:  dexlansoprazole  TAKE ONE CAPSULE BY MOUTH EVERY DAY     docusate sodium 100 MG capsule  Commonly known as:  COLACE  Take 1 capsule (100 mg total) by mouth at bedtime.     DULoxetine 30 MG capsule  Commonly known as:  CYMBALTA  TAKE ONE CAPSULE BY MOUTH EVERY DAY     famotidine 20 MG tablet  Commonly known as:  PEPCID  Take 1  tablet (20 mg total) by mouth 2 (two) times daily.     finasteride 5 MG tablet  Commonly known as:  PROSCAR  Take 1 tablet (5 mg total) by mouth daily.     furosemide 40 MG tablet  Commonly known as:  LASIX  Take 1 tablet (40 mg total) by mouth every other day.     hydrALAZINE 50 MG tablet  Commonly known as:  APRESOLINE  Take 1 tablet (50 mg total) by mouth 3 (three) times daily.     HYDROcodone-acetaminophen 5-325 MG tablet  Commonly known as:  NORCO/VICODIN  Take 1 tablet by mouth every 6 (six) hours as needed for moderate pain.     latanoprost 0.005 % ophthalmic solution  Commonly known as:  XALATAN  Place 1 drop into both eyes at bedtime.     loperamide 2 MG capsule  Commonly known as:  IMODIUM  Take 2 mg by mouth as needed for diarrhea or loose stools (Not to exceed 8 doses in 24 hours).     magnesium hydroxide 400 MG/5ML suspension  Commonly known as:  MILK OF MAGNESIA  Take 30 mLs by mouth at bedtime as needed for mild constipation.     meclizine 12.5 MG tablet  Commonly known as:  ANTIVERT  Take 12.5 mg by mouth every 12 (twelve) hours as needed for dizziness.     mupirocin ointment 2 %  Commonly known as:  BACTROBAN  Place into the nose 2 (two) times daily.     MYRBETRIQ 25 MG Tb24 tablet  Generic drug:  mirabegron ER  TAKE 1 TABLET BY MOUTH EVERY DAY     nebivolol 2.5 MG tablet  Commonly known as:  BYSTOLIC  Take 1 tablet (2.5 mg total) by mouth daily.     nitroGLYCERIN 0.4 MG SL tablet  Commonly known as:  NITROSTAT  Place 0.4 mg under the tongue every 5 (five) minutes as needed. Chest pain     ondansetron 4 MG tablet  Commonly known as:  ZOFRAN  Take 4 mg by mouth every 8 (eight) hours as needed for nausea.     polyethylene glycol powder powder  Commonly known as:  GLYCOLAX/MIRALAX  MIX 17 GRAMS WITH 4 TO 6 OUNCES OF WATER AND DRINK ONCE DAILY AS NEEDED FOR CONSTIPATION     polyvinyl alcohol 1.4 % ophthalmic solution  Commonly known as:   LIQUIFILM TEARS  Place  2 drops into both eyes 3 (three) times daily.     predniSONE 10 MG tablet  Commonly known as:  DELTASONE  Take 1 tablet (10 mg total) by mouth daily with breakfast.     senna-docusate 8.6-50 MG tablet  Commonly known as:  Senokot-S  Take 1 tablet by mouth at bedtime as needed for mild constipation.     simvastatin 40 MG tablet  Commonly known as:  ZOCOR  Take 1 tablet (40 mg total) by mouth daily at 6 PM.     sodium chloride 0.65 % Soln nasal spray  Commonly known as:  OCEAN  Place 1 spray into both nostrils 2 (two) times daily. For 1 week     tamsulosin 0.4 MG Caps capsule  Commonly known as:  FLOMAX  Take 2 capsules (0.8 mg total) by mouth daily.     XARELTO 15 MG Tabs tablet  Generic drug:  Rivaroxaban  TAKE 1 TABLET BY MOUTH EVERY DAY WITH SUPPER       Allergies  Allergen Reactions  . Ativan [Lorazepam] Other (See Comments)    unknown  . Augmentin [Amoxicillin-Pot Clavulanate] Nausea And Vomiting    daughter states he can tolerate Amoxicillin ok Profuse vomiting. Unable to answer penicillin questions as pt is from Nursing home and unable to provide answers to questions  . Belladonna Alkaloids     Unknown- on MAR  . Corticosteroids     Unknown- on MAR  . Fludrocortisone Acetate Other (See Comments)    "leg swelling"  . Parabens     Unknown- on MAR  . Scopace [Scopolamine] Other (See Comments)    *patch "Made him crazy" per patients daughter    Follow-up Information    Follow up with [facility] TOM, MD In 1 week.   Specialty:  Family Medicine   Why:  hospital discharge follow up, repeat bmp at follow up   Contact information:   [address] [place] [address] [phone]        The results of significant diagnostics from this hospitalization (including imaging, microbiology, ancillary and laboratory) are listed below for reference.    Significant Diagnostic Studies: Dg Chest 2 View  02/13/2015  CLINICAL DATA:   Shortness of breath EXAM: CHEST  2 VIEW COMPARISON:  Chest radiograph from one day prior. FINDINGS: Stable cardiomediastinal silhouette with normal heart size. No pneumothorax. No pleural effusion. Clear lungs, with no focal lung consolidation and no pulmonary edema. Partially visualized is an IVC filter in the posterior abdomen on the lateral view. IMPRESSION: No active cardiopulmonary disease. Electronically Signed   By: Delbert Phenix M.D.   On: 02/13/2015 12:17   Dg Chest 2 View  02/12/2015  CLINICAL DATA:  Shortness of breath EXAM: CHEST  2 VIEW COMPARISON:  12/21/2014 FINDINGS: No cardiomegaly when accounting for an apical fat pad. Stable aortic and hilar contours. There is no edema, consolidation, effusion, or pneumothorax. No acute osseous findings. IMPRESSION: No active cardiopulmonary disease. Electronically Signed   By: Marnee Spring M.D.   On: 02/12/2015 11:45   Ct Head Wo Contrast  02/12/2015  CLINICAL DATA:  Three-day history of headache and dizziness EXAM: CT HEAD WITHOUT CONTRAST TECHNIQUE: Contiguous axial images were obtained from the base of the skull through the vertex without intravenous contrast. COMPARISON:  Head CT December 21, 2014; brain MRI December 21, 2014 FINDINGS: Mild to moderate diffuse atrophy remain stable. There is no intracranial mass hemorrhage, extra-axial fluid collection, or midline shift. There is  evidence of a prior infarct in the proximal left parietal lobe, stable. There is a prior infarct in the posterior right parietal lobe, stable. There is moderate small vessel disease throughout the centra semiovale bilaterally. No new gray-white compartment lesions appreciable. No acute infarct is evident. The bony calvarium appears intact. The mastoid air cells are clear. No intraorbital lesions are identified. IMPRESSION: Prior infarcts, stable. Atrophy with small vessel disease, stable. No mass, hemorrhage, or acute appearing infarct. Electronically Signed   By: Bretta Bang III M.D.   On: 02/12/2015 11:51   US Renal  02/12/2015  CLINICAL DATA:  Acute renal failure.  History of hypertension. EXAM: RENAL / URINARY TRACT ULTRASOUND COMPLETE COMPARISON:  Renal ultrasound 05/24/2014. FINDINGS: Right Kidney: Length: 10.9 cm. There is renal cortical thinning and increased echogenicity with prominence of the renal sinus fat. No hydronephrosis or focal cortical abnormality. Left Kidney: Length: 8.9 cm. There is renal cortical thinning and increased echogenicity with prominence of the renal sinus fat. No hydronephrosis or focal cortical abnormality. Bladder: Appears normal for degree of bladder distention. IMPRESSION: 1. Progressive cortical thinning and decreased renal size compared with prior study of 9 months ago. Previous renal measurements 11.4 cm, and 9.7 cm, respectively. 2. No hydronephrosis. Electronically Signed   By: Carey Bullocks M.D.   On: 02/12/2015 18:58   Nm Pulmonary Perf And Vent  02/12/2015  CLINICAL DATA:  Inpatient. Several months of intermittent shortness of breath. Hypoxia. History of DVT and pulmonary embolism. EXAM: NUCLEAR MEDICINE VENTILATION - PERFUSION LUNG SCAN TECHNIQUE: Ventilation images were obtained in multiple projections using inhaled aerosol Tc-2m DTPA. Perfusion images were obtained in multiple projections after intravenous injection of Tc-37m MAA. RADIOPHARMACEUTICALS:  31 mCi Technetium-29m DTPA aerosol inhalation and 4.1 mCi Technetium-68m MAA IV COMPARISON:  05/22/2014 V/Q scan. Chest radiograph from earlier today. FINDINGS: Ventilation: No focal ventilation defect. Central airway radiotracer accumulation and mildly heterogeneous ventilation throughout both lungs suggests nonspecific airways disease. Perfusion: No wedge shaped peripheral perfusion defects to suggest acute pulmonary embolism. IMPRESSION: Very low probability for pulmonary embolism. Electronically Signed   By: Delbert Phenix M.D.   On: 02/12/2015 16:47     Microbiology: Recent Results (from the past 240 hour(s))  MRSA PCR Screening     Status: Abnormal   Collection Time: 02/12/15  3:16 PM  Result Value Ref Range Status   MRSA by PCR POSITIVE (A) NEGATIVE Final    Comment:        The GeneXpert MRSA Assay (FDA approved for NASAL specimens only), is one component of a comprehensive MRSA colonization surveillance program. It is not intended to diagnose MRSA infection nor to guide or monitor treatment for MRSA infections. RESULT CALLED TO, READ BACK BY AND VERIFIED WITH: M.LONG RN AT 1733 ON 12.2.16 BY W.BUCHHOLZ.      Labs: Basic Metabolic Panel:  Recent Labs Lab 02/12/15 1058 02/13/15 0911 02/14/15 0444  NA 141 141 140  K 4.1 3.8 3.6  CL 103 105 110  CO2 GLUCOSE 111* 114* 91  BUN 54* 47* 38*  CREATININE 2.53* 2.13* 1.80*  CALCIUM 9.2 8.6* 8.5*   Liver Function Tests:  Recent Labs Lab 02/12/15 1058  AST 18  ALT 13*  ALKPHOS 56  BILITOT 0.9  PROT 7.2  ALBUMIN 3.6   No results for input(s): LIPASE, AMYLASE in the last 168 hours. No results for input(s): AMMONIA in the last 168 hours. CBC:  Recent Labs Lab 02/12/15 1058  WBC 9.5  NEUTROABS 7.3  HGB 13.6  HCT 41.3  MCV 97.4  PLT 187   Cardiac Enzymes: No results for input(s): CKTOTAL, CKMB, CKMBINDEX, TROPONINI in the last 168 hours. BNP: BNP (last 3 results)  Recent Labs  11/16/14 1942  BNP 598.4*    ProBNP (last 3 results) No results for input(s): PROBNP in the last 8760 hours.  CBG: No results for input(s): GLUCAP in the last 168 hours.     SignedAlbertine Grates:  Kaavya Puskarich MD, PhD  [facility] 02/14/2015, 1:31 PM

## 2015-02-14 NOTE — Care Management Note (Signed)
Case Management Note  Patient Details  Name: Jason [name] MRN: [id number] Date of Birth: June 10, 1919  Subjective/Objective:      ARF, hypoxia              Action/Plan: NCM spoke to pt and gave permission to speak to son, [name]. [facility] Has RW, and wheelchair at facility. [facility] Contact[facility] rep [facility]. And next available start of care date 02/17/2015. Pt and adult children agreeable to start of care date.   Expected Discharge Date:  02/14/2015               Expected Discharge Plan:  Assisted Living / Rest Home  In-House Referral:     Discharge planning Services  CM Consult  Post Acute Care Choice:  Home Health Choice offered to:  Adult children   HH Arranged:  [facility]  Status of Service:  Completed, signed off  Medicare Important Message Given:  yes Date Medicare IM Given:    Medicare IM give by:    Date Additional Medicare IM Given:    Additional Medicare Important Message give by:     If discussed at Long Length of Stay Meetings, dates discussed:    Additional Comments:  Elliot CousinShavis, Geovanny Sartin Ellen, RN 02/14/2015, 2:22 PM

## 2015-02-14 NOTE — [place] FL2 (Signed)
[place] MEDICAID FL2 LEVEL OF CARE SCREENING TOOL     IDENTIFICATION  Patient Name: Jason [name] Birthdate: 1919-10-14 Sex: male Admission Date (Current Location): 02/12/2015  [facility] [place]:  [id number]   Facility and Address:  [facility],  [address], [place] [id number]      Provider Number: [id number]  Attending Physician Name and Address:  No att. providers found  Relative Name and Phone Number:       Current Level of Care: Hospital Recommended Level of Care: Assisted Living Facility Prior Approval Number:    Date Approved/Denied:   PASRR Number:    Discharge Plan: Other (Comment) (ALF)    Current Diagnoses: Patient Active Problem List   Diagnosis Date Noted  . Hypoxia 02/12/2015  . Essential hypertension 12/22/2014  . Acute ischemic stroke (HCC) 12/22/2014  . HLD (hyperlipidemia)   . Pulmonary vascular congestion 11/17/2014  . Bilateral edema of lower extremity 11/17/2014  . Diastolic CHF, acute (HCC) 11/17/2014  . CHF (congestive heart failure) (HCC) 11/17/2014  . Carotid stenosis 11/02/2014  . Orthostatic hypotension 06/29/2014  . Contusion   . Syncope and collapse 05/22/2014  . Scalp laceration 05/22/2014  . Acute renal failure superimposed on stage 3 chronic kidney disease (HCC) 05/22/2014  . Acute bronchitis 05/22/2014  . Gait disorder 02/04/2014  . Hereditary and idiopathic peripheral neuropathy 02/04/2014  . SOB (shortness of breath) 12/17/2012  . Breast pain 09/08/2012  . Breast lump 09/08/2012  . OA (osteoarthritis) of knee 09/08/2012  . Peripheral edema 09/08/2012  . History of DVT (deep vein thrombosis) 03/02/2012  . Dehydration 03/02/2012  . UTI (lower urinary tract infection) 12/18/2011  . Sepsis (HCC) 12/18/2011  . Transaminitis 12/18/2011  . PAF (paroxysmal atrial fibrillation) (HCC) 12/09/2011  . ARF (acute renal failure) (HCC) 12/05/2011  . Herpes zoster 12/02/2011  . Thrombocytopenia (HCC)  12/02/2011  . CAD (coronary artery disease) 12/02/2011  . CKD (chronic kidney disease), stage III 12/02/2011  . Chronic constipation 10/04/2010  . HYPERTENSION, BENIGN 09/17/2009  . Irritable bowel syndrome 10/15/2007  . VERTIGO 06/25/2007  . ANXIETY 05/27/2007  . PERIPHERAL VASCULAR DISEASE 01/18/2007  . GERD 01/18/2007  . HYPERLIPIDEMIA 10/03/2006    Orientation ACTIVITIES/SOCIAL BLADDER RESPIRATION    Self, Time, Situation, Place    Incontinent Normal  BEHAVIORAL SYMPTOMS/MOOD NEUROLOGICAL BOWEL NUTRITION STATUS      Continent Diet (low sodium heart healthy)  PHYSICIAN VISITS COMMUNICATION OF NEEDS Height & Weight Skin    Verbally 6' (182.9 cm) 198 lbs. Normal          AMBULATORY STATUS RESPIRATION    Supervision limited Normal      Personal Care Assistance Level of Assistance  Bathing, Dressing Bathing Assistance: Limited assistance   Dressing Assistance: Limited assistance      Functional Limitations Info  Sight, Hearing Sight Info: Impaired Hearing Info: Impaired         SPECIAL CARE FACTORS FREQUENCY                      Additional Factors Info  Allergies   Allergies Info: Ativan, Augmentin, Belladonna Alkaloids, Corticosteroids, Fludrocortisone Acetate, Parabens, Scopace           Current Medications (02/14/2015):  This is the current hospital active medication list Current Facility-Administered Medications  Medication Dose Route Frequency Provider Last Rate Last Dose  . acetaminophen (TYLENOL) tablet 1,000 mg  1,000 mg Oral BID Albertine GratesFang Holiday Mcmenamin, MD   1,000 mg at 02/14/15 1018  .  albuterol (PROVENTIL) (2.5 MG/3ML) 0.083% nebulizer solution 2.5 mg  2.5 mg Inhalation Q6H PRN Albertine Grates, MD      . ALPRAZolam Prudy Feeler) tablet 0.25 mg  0.25 mg Oral BID PRN Albertine Grates, MD   0.25 mg at 02/13/15 2111  . aspirin EC tablet 81 mg  81 mg Oral Daily Albertine Grates, MD   81 mg at 02/14/15 1019  . Chlorhexidine Gluconate Cloth 2 % PADS 6 each  6 each Topical Q0600 Albertine Grates, MD    6 each at 02/14/15 0522  . DULoxetine (CYMBALTA) DR capsule 30 mg  30 mg Oral Daily Albertine Grates, MD   30 mg at 02/14/15 1018  . famotidine (PEPCID) tablet 20 mg  20 mg Oral BID Albertine Grates, MD   20 mg at 02/14/15 1018  . finasteride (PROSCAR) tablet 5 mg  5 mg Oral Daily Albertine Grates, MD   5 mg at 02/14/15 1019  . hydrALAZINE (APRESOLINE) tablet 50 mg  50 mg Oral BID Albertine Grates, MD   50 mg at 02/14/15 1018  . HYDROcodone-acetaminophen (NORCO/VICODIN) 5-325 MG per tablet 1 tablet  1 tablet Oral Q6H PRN Albertine Grates, MD      . latanoprost (XALATAN) 0.005 % ophthalmic solution 1 drop  1 drop Both Eyes QHS Albertine Grates, MD   1 drop at 02/13/15 2116  . meclizine (ANTIVERT) tablet 12.5 mg  12.5 mg Oral Q12H PRN Albertine Grates, MD      . mupirocin ointment (BACTROBAN) 2 %   Nasal BID Albertine Grates, MD      . nebivolol (BYSTOLIC) tablet 2.5 mg  2.5 mg Oral Daily Janetta Hora, PA-C   2.5 mg at 02/14/15 1018  . nitroGLYCERIN (NITROSTAT) SL tablet 0.4 mg  0.4 mg Sublingual Q5 min PRN Albertine Grates, MD      . pantoprazole (PROTONIX) EC tablet 20 mg  20 mg Oral Daily Albertine Grates, MD   20 mg at 02/14/15 1018  . phenol (CHLORASEPTIC) mouth spray 1 spray  1 spray Mouth/Throat PRN Albertine Grates, MD      . polyvinyl alcohol (LIQUIFILM TEARS) 1.4 % ophthalmic solution 2 drop  2 drop Both Eyes TID Albertine Grates, MD   2 drop at 02/14/15 1020  . predniSONE (DELTASONE) tablet 10 mg  10 mg Oral Q breakfast Albertine Grates, MD   10 mg at 02/14/15 0737  . Rivaroxaban (XARELTO) tablet 15 mg  15 mg Oral Q supper Danford Bad, RPH   15 mg at 02/13/15 1712  . senna-docusate (Senokot-S) tablet 1 tablet  1 tablet Oral BID Albertine Grates, MD   1 tablet at 02/14/15 1018  . senna-docusate (Senokot-S) tablet 1 tablet  1 tablet Oral QHS PRN Albertine Grates, MD      . simvastatin (ZOCOR) tablet 40 mg  40 mg Oral q1800 Albertine Grates, MD   40 mg at 02/13/15 1712  . sodium chloride (OCEAN) 0.65 % nasal spray 1 spray  1 spray Each Nare BID Albertine Grates, MD   1 spray at 02/14/15 1020  . sodium chloride 0.9 % injection 3 mL   3 mL Intravenous Q12H Albertine Grates, MD   3 mL at 02/12/15 2201  . tamsulosin (FLOMAX) capsule 0.8 mg  0.8 mg Oral Daily Albertine Grates, MD   0.8 mg at 02/14/15 1018  . technetium albumin aggregated (MAA) injection solution 4 milli Curie  4 milli Curie Intravenous Once PRN [employer], PA-C      . technetium TC 65M  diethylenetriame-pentaacetic acid (DTPA) injection 31 milli Curie  31 milli Curie Intravenous Once PRN Jaynie Crumble, PA-C       Current Outpatient Prescriptions  Medication Sig Dispense Refill  . acetaminophen (TYLENOL) 500 MG tablet Take 1,000 mg by mouth 2 (two) times daily. Take every day per [facility]    . alum & mag hydroxide-simeth (MAALOX/MYLANTA) 200-200-20 MG/5ML suspension Take 30 mLs by mouth every 6 (six) hours as needed for indigestion or heartburn.    [name] aspirin EC 81 MG EC tablet Take 1 tablet (81 mg total) by mouth daily.    [name] DEXILANT 60 MG capsule TAKE ONE CAPSULE BY MOUTH EVERY DAY 30 capsule 11  . docusate sodium (COLACE) 100 MG capsule Take 1 capsule (100 mg total) by mouth at bedtime. 30 capsule 11  . DULoxetine (CYMBALTA) 30 MG capsule TAKE ONE CAPSULE BY MOUTH EVERY DAY 30 capsule 6  . latanoprost (XALATAN) 0.005 % ophthalmic solution Place 1 drop into both eyes at bedtime.    [name] loperamide (IMODIUM) 2 MG capsule Take 2 mg by mouth as needed for diarrhea or loose stools (Not to exceed 8 doses in 24 hours).    . magnesium hydroxide (MILK OF MAGNESIA) 400 MG/5ML suspension Take 30 mLs by mouth at bedtime as needed for mild constipation.    . meclizine (ANTIVERT) 12.5 MG tablet Take 12.5 mg by mouth every 12 (twelve) hours as needed for dizziness.    [name] MYRBETRIQ 25 MG TB24 tablet TAKE 1 TABLET BY MOUTH EVERY DAY 30 tablet 11  . nitroGLYCERIN (NITROSTAT) 0.4 MG SL tablet Place 0.4 mg under the tongue every 5 (five) minutes as needed. Chest pain    . ondansetron (ZOFRAN) 4 MG tablet Take 4 mg by mouth every 8 (eight) hours as needed for nausea.    . polyethylene glycol  powder (GLYCOLAX/MIRALAX) powder MIX 17 GRAMS WITH 4 TO 6 OUNCES OF WATER AND DRINK ONCE DAILY AS NEEDED FOR CONSTIPATION 527 g 2  . polyvinyl alcohol (LIQUIFILM TEARS) 1.4 % ophthalmic solution Place 2 drops into both eyes 3 (three) times daily.    . predniSONE (DELTASONE) 10 MG tablet Take 1 tablet (10 mg total) by mouth daily with breakfast. 30 tablet 5  . simvastatin (ZOCOR) 40 MG tablet Take 1 tablet (40 mg total) by mouth daily at 6 PM.    . sodium chloride (OCEAN) 0.65 % SOLN nasal spray Place 1 spray into both nostrils 2 (two) times daily. For 1 week 1 Bottle 0  . XARELTO 15 MG TABS tablet TAKE 1 TABLET BY MOUTH EVERY DAY WITH SUPPER 30 tablet 6  . ALPRAZolam (XANAX) 0.25 MG tablet Take 1 tablet (0.25 mg total) by mouth 2 (two) times daily as needed for anxiety. 10 tablet 0  . Chlorhexidine Gluconate Cloth 2 % PADS Apply 6 each topically daily at 6 (six) AM. 30 each 0  . famotidine (PEPCID) 20 MG tablet Take 1 tablet (20 mg total) by mouth 2 (two) times daily. 60 tablet 0  . finasteride (PROSCAR) 5 MG tablet Take 1 tablet (5 mg total) by mouth daily. 30 tablet 0  . furosemide (LASIX) 40 MG tablet Take 1 tablet (40 mg total) by mouth every other day. 30 tablet 0  . hydrALAZINE (APRESOLINE) 50 MG tablet Take 1 tablet (50 mg total) by mouth 3 (three) times daily. 90 tablet 0  . HYDROcodone-acetaminophen (NORCO/VICODIN) 5-325 MG tablet Take 1 tablet by mouth every 6 (six) hours as needed for moderate pain. 10  tablet 0  . mupirocin ointment (BACTROBAN) 2 % Place into the nose 2 (two) times daily. 22 g 0  . nebivolol (BYSTOLIC) 2.5 MG tablet Take 1 tablet (2.5 mg total) by mouth daily. 30 tablet 0  . senna-docusate (SENOKOT-S) 8.6-50 MG tablet Take 1 tablet by mouth at bedtime as needed for mild constipation. 30 tablet 0  . tamsulosin (FLOMAX) 0.4 MG CAPS capsule Take 2 capsules (0.8 mg total) by mouth daily. 30 capsule 0     Discharge Medications: Please see discharge summary for a list of  discharge medications.  Relevant Imaging Results:  Relevant Lab Results:  Recent Labs    Additional Information    Annetta Maw, LCSW

## 2015-02-14 NOTE — Evaluation (Addendum)
Physical Therapy Evaluation Patient Details Name: Jason [name] MRN: [id number] DOB: 12/25/1919 Today's Date: 02/14/2015   History of Present Illness  81 yo male With pmhx- paroxysmal afib on xarelto, h/o recurrent dvt, s/p ivc filter, h/o carotid stenosis s/p CEA in 2016, h/o recent cva with slurred speech ( resolved), he was sent from ALF due to c/o not feeling well, generalized weakness  Clinical Impression  Pt will benefit from continued PT in acute setting and at D/C, recommend HHPT as pt is at risk for falls, with LOB x1 and safety issues amb with RW (which he does use at baseline)    Follow Up Recommendations HHPT, Supervision - Intermittent    Equipment Recommendations  None recommended by PT    Recommendations for Other Services       Precautions / Restrictions Precautions Precautions: Fall      Mobility  Bed Mobility Overal bed mobility: Needs Assistance Bed Mobility: Supine to Sit     Supine to sit: Supervision     General bed mobility comments: for lines and safety  Transfers Overall transfer level: Needs assistance Equipment used: Rolling walker (2 wheeled) Transfers: Sit to/from Stand Sit to Stand: Min guard         General transfer comment: cues for hand placement and safety  Ambulation/Gait Ambulation/Gait assistance: Min guard;Min assist Ambulation Distance (Feet): 120 Feet Assistive device: Rolling walker (2 wheeled) Gait Pattern/deviations: Step-through pattern;Decreased stride length     General Gait Details: LOB  x 1 during initial 10' with min to recover; cues for RW safety  Stairs            Wheelchair Mobility    Modified Rankin (Stroke Patients Only)       Balance Overall balance assessment: Needs assistance   Sitting balance-Leahy Scale: Good       Standing balance-Leahy Scale: Fair Standing balance comment: pt did have LOB during gait requiring min assist to recover; no  further LOB during session and denies falls  at home             High level balance activites: Turns;Head turns               Pertinent Vitals/Pain Pain Assessment: No/denies pain    Home Living Family/patient expects to be discharged to:: Assisted living               Home Equipment: Walker - 4 wheels;Shower seat;Bedside commode;Hand held shower head;Grab bars - toilet;Grab bars - tub/shower      Prior Function Level of Independence: Independent with assistive device(s);Independent; pt reports he does exercise classes at ALF         Comments: amb with RW at baseline, I in ADLS per pt     Hand Dominance        Extremity/Trunk Assessment   Upper Extremity Assessment: Overall WFL for tasks assessed           Lower Extremity Assessment: Overall WFL for tasks assessed         Communication   Communication: HOH  Cognition Arousal/Alertness: Awake/alert Behavior During Therapy: WFL for tasks assessed/performed Overall Cognitive Status: Within Functional Limits for tasks assessed                      General Comments      Exercises        Assessment/Plan    PT Assessment Patient needs continued PT services  PT Diagnosis Difficulty walking   PT  Problem List Decreased strength;Decreased activity tolerance;Decreased mobility;Decreased knowledge of use of DME  PT Treatment Interventions DME instruction;Gait training;Functional mobility training;Therapeutic exercise;Therapeutic activities;Balance training   PT Goals (Current goals can be found in the Care Plan section) Acute Rehab PT Goals Patient Stated Goal: to get back home PT Goal Formulation: With patient Time For Goal Achievement: 02/21/15 Potential to Achieve Goals: Good    Frequency Min 3X/week   Barriers to discharge        Co-evaluation               End of Session Equipment Utilized During Treatment: Gait belt Activity Tolerance: Patient tolerated treatment well Patient left: with call bell/phone within  reach;in chair;with chair alarm set Nurse Communication: Mobility status         Time: 1010-1024 PT Time Calculation (min) (ACUTE ONLY): 14 min   Charges:   PT Evaluation $Initial PT Evaluation Tier I: 1 Procedure     PT G CodesDrucilla Chalet:        Berenise Hunton 02/14/2015, 1:22 PM

## 2015-02-14 NOTE — Care Management Important Message (Signed)
Important Message  Patient Details  Name: Jason [name] MRN: [id number] Date of Birth: 05/14/1919   Medicare Important Message Given:  Yes    Elliot CousinShavis, Chardai Gangemi Ellen, RN 02/14/2015, 2:25 PM

## 2015-02-14 NOTE — Clinical Social Work Note (Signed)
CSW received a call from RN stating that pt's family took pt back to his ALF but had informed nurses that he was  Going home with them.  [facility] and d/c summary.  CSW provided with contact and [place] [facility] and called to provide information and fax number.  CSW prepared FL2 and faxed information to ALF.  [name].Jason Bubaegina Haylyn Halberg, LCSW [facility] Clinical Social Worker - Weekend Coverage cell #: [phone]

## 2015-02-17 NOTE — Progress Notes (Signed)
Initial utilization review completed at request of insurance company

## 2015-02-19 DIAGNOSIS — N183 Chronic kidney disease, stage 3 (moderate): Secondary | ICD-10-CM | POA: Diagnosis not present

## 2015-02-19 DIAGNOSIS — G609 Hereditary and idiopathic neuropathy, unspecified: Secondary | ICD-10-CM | POA: Diagnosis not present

## 2015-02-19 DIAGNOSIS — M549 Dorsalgia, unspecified: Secondary | ICD-10-CM | POA: Diagnosis not present

## 2015-02-19 DIAGNOSIS — R42 Dizziness and giddiness: Secondary | ICD-10-CM | POA: Diagnosis not present

## 2015-02-19 DIAGNOSIS — M542 Cervicalgia: Secondary | ICD-10-CM | POA: Diagnosis not present

## 2015-02-19 DIAGNOSIS — I739 Peripheral vascular disease, unspecified: Secondary | ICD-10-CM | POA: Diagnosis not present

## 2015-02-19 DIAGNOSIS — I130 Hypertensive heart and chronic kidney disease with heart failure and stage 1 through stage 4 chronic kidney disease, or unspecified chronic kidney disease: Secondary | ICD-10-CM | POA: Diagnosis not present

## 2015-02-19 DIAGNOSIS — I480 Paroxysmal atrial fibrillation: Secondary | ICD-10-CM | POA: Diagnosis not present

## 2015-02-19 DIAGNOSIS — K5909 Other constipation: Secondary | ICD-10-CM | POA: Diagnosis not present

## 2015-02-19 DIAGNOSIS — R627 Adult failure to thrive: Secondary | ICD-10-CM | POA: Diagnosis not present

## 2015-02-19 DIAGNOSIS — I5030 Unspecified diastolic (congestive) heart failure: Secondary | ICD-10-CM | POA: Diagnosis not present

## 2015-02-19 DIAGNOSIS — R531 Weakness: Secondary | ICD-10-CM | POA: Diagnosis not present

## 2015-02-19 DIAGNOSIS — I2510 Atherosclerotic heart disease of native coronary artery without angina pectoris: Secondary | ICD-10-CM | POA: Diagnosis not present

## 2015-02-22 ENCOUNTER — Ambulatory Visit: Payer: Medicare Other | Admitting: Neurology

## 2015-02-22 ENCOUNTER — Telehealth: Payer: Self-pay | Admitting: Family Medicine

## 2015-02-22 DIAGNOSIS — K5909 Other constipation: Secondary | ICD-10-CM | POA: Diagnosis not present

## 2015-02-22 DIAGNOSIS — G609 Hereditary and idiopathic neuropathy, unspecified: Secondary | ICD-10-CM | POA: Diagnosis not present

## 2015-02-22 DIAGNOSIS — R627 Adult failure to thrive: Secondary | ICD-10-CM | POA: Diagnosis not present

## 2015-02-22 DIAGNOSIS — I130 Hypertensive heart and chronic kidney disease with heart failure and stage 1 through stage 4 chronic kidney disease, or unspecified chronic kidney disease: Secondary | ICD-10-CM | POA: Diagnosis not present

## 2015-02-22 DIAGNOSIS — M549 Dorsalgia, unspecified: Secondary | ICD-10-CM | POA: Diagnosis not present

## 2015-02-22 DIAGNOSIS — R531 Weakness: Secondary | ICD-10-CM | POA: Diagnosis not present

## 2015-02-22 DIAGNOSIS — M542 Cervicalgia: Secondary | ICD-10-CM | POA: Diagnosis not present

## 2015-02-22 DIAGNOSIS — I5030 Unspecified diastolic (congestive) heart failure: Secondary | ICD-10-CM | POA: Diagnosis not present

## 2015-02-22 DIAGNOSIS — R42 Dizziness and giddiness: Secondary | ICD-10-CM | POA: Diagnosis not present

## 2015-02-22 DIAGNOSIS — I480 Paroxysmal atrial fibrillation: Secondary | ICD-10-CM | POA: Diagnosis not present

## 2015-02-22 DIAGNOSIS — I2510 Atherosclerotic heart disease of native coronary artery without angina pectoris: Secondary | ICD-10-CM | POA: Diagnosis not present

## 2015-02-22 DIAGNOSIS — N183 Chronic kidney disease, stage 3 (moderate): Secondary | ICD-10-CM | POA: Diagnosis not present

## 2015-02-22 DIAGNOSIS — I739 Peripheral vascular disease, unspecified: Secondary | ICD-10-CM | POA: Diagnosis not present

## 2015-02-22 NOTE — Telephone Encounter (Signed)
ok 

## 2015-02-22 NOTE — Telephone Encounter (Signed)
Need discontinue order for the Chlorhexidine Gluconate 2% pads.  They were being used for wound care and all wounds have healed.  Please send discontinue order.  [facility]

## 2015-02-23 ENCOUNTER — Ambulatory Visit: Payer: Medicare Other | Admitting: Neurology

## 2015-02-25 DIAGNOSIS — G609 Hereditary and idiopathic neuropathy, unspecified: Secondary | ICD-10-CM | POA: Diagnosis not present

## 2015-02-25 DIAGNOSIS — R42 Dizziness and giddiness: Secondary | ICD-10-CM | POA: Diagnosis not present

## 2015-02-25 DIAGNOSIS — I2510 Atherosclerotic heart disease of native coronary artery without angina pectoris: Secondary | ICD-10-CM | POA: Diagnosis not present

## 2015-02-25 DIAGNOSIS — M542 Cervicalgia: Secondary | ICD-10-CM | POA: Diagnosis not present

## 2015-02-25 DIAGNOSIS — I5030 Unspecified diastolic (congestive) heart failure: Secondary | ICD-10-CM | POA: Diagnosis not present

## 2015-02-25 DIAGNOSIS — R627 Adult failure to thrive: Secondary | ICD-10-CM | POA: Diagnosis not present

## 2015-02-25 DIAGNOSIS — K5909 Other constipation: Secondary | ICD-10-CM | POA: Diagnosis not present

## 2015-02-25 DIAGNOSIS — M549 Dorsalgia, unspecified: Secondary | ICD-10-CM | POA: Diagnosis not present

## 2015-02-25 DIAGNOSIS — N183 Chronic kidney disease, stage 3 (moderate): Secondary | ICD-10-CM | POA: Diagnosis not present

## 2015-02-25 DIAGNOSIS — I480 Paroxysmal atrial fibrillation: Secondary | ICD-10-CM | POA: Diagnosis not present

## 2015-02-25 DIAGNOSIS — I739 Peripheral vascular disease, unspecified: Secondary | ICD-10-CM | POA: Diagnosis not present

## 2015-02-25 DIAGNOSIS — I130 Hypertensive heart and chronic kidney disease with heart failure and stage 1 through stage 4 chronic kidney disease, or unspecified chronic kidney disease: Secondary | ICD-10-CM | POA: Diagnosis not present

## 2015-02-25 DIAGNOSIS — R531 Weakness: Secondary | ICD-10-CM | POA: Diagnosis not present

## 2015-03-01 ENCOUNTER — Ambulatory Visit (INDEPENDENT_AMBULATORY_CARE_PROVIDER_SITE_OTHER): Payer: Medicare Other | Admitting: Family Medicine

## 2015-03-01 ENCOUNTER — Encounter: Payer: Self-pay | Admitting: Family Medicine

## 2015-03-01 VITALS — BP 150/72 | HR 60 | Temp 97.9°F | Resp 20

## 2015-03-01 DIAGNOSIS — G4734 Idiopathic sleep related nonobstructive alveolar hypoventilation: Secondary | ICD-10-CM

## 2015-03-01 DIAGNOSIS — I10 Essential (primary) hypertension: Secondary | ICD-10-CM

## 2015-03-01 DIAGNOSIS — Z09 Encounter for follow-up examination after completed treatment for conditions other than malignant neoplasm: Secondary | ICD-10-CM | POA: Diagnosis not present

## 2015-03-01 DIAGNOSIS — N183 Chronic kidney disease, stage 3 (moderate): Secondary | ICD-10-CM

## 2015-03-01 NOTE — Progress Notes (Signed)
Subjective:    Patient ID: Jason [name], male    DOB: [birth year], 81 y.o.   MRN: [id number]  HPI  Recently admitted to hospital with ARF.  I have copied relevant portions of the discharge summary and included them below for my reference: Admit date: 02/12/2015 Discharge date: 02/14/2015  Time spent: >10930mins  Recommendations for Outpatient Follow-up:  1. F/u with PMD within a week, repeat bmp at follow up to monitor creatinine level  Discharge Diagnoses:  Active Hospital Problems   Diagnosis Date Noted  . Hypoxia 02/12/2015  . ARF (acute renal failure) (HCC) 12/05/2011    Resolved Hospital Problems   Diagnosis Date Noted Date Resolved  No resolved problems to display.    Discharge Condition: stable  Diet recommendation: heart healthy  Filed Weights   02/12/15 1019  Weight: 198 lb (89.812 kg)    History of present illness:  Jason [name] is a 81 y.o. male   With h/o paroxysmal afib on xarelto, h/o recurrent dvt, s/p ivc filter, h/o carotid stenosis s/p CEA in 2016, h/o recent cva with slurred speech ( resolved), he was sent from ALF due to c/o not feeling well, generalized weakness, decreased urine output. He reported recently was treated for sinusitis with levaquin, currently he denies fever, no headache, no cough, no sob, no n/v, no diarrhea, ED course: ct head no acute findings, cxr unremarkable, ua no infection, labs showed elevated cr at 2.53 (baseline 1.4 to 1.6). Tele showed bradycardia.  Hospital Course:  Active Problems:  ARF (acute renal failure) (HCC)  Hypoxia  ARF on CKD III: from dehydration? Or from recent use of levaquin? Hold lasix, d/c voltaren.  Renal dosing meds,  renal us : no hydro, Progressive cortical thinning and decreased renal size compared with prior study of 9 months ago.  Received hydration briefly, cr improving, d/c ivf,  Restarted lasix at decreased frequency at discharge. Was on 40mg  daily, changed to  40mg  every other day.  Sinus Bradycardia:  could contribute to generalized weakness, dizziness.  Per chart review, home meds bystolic was decreased to 5mg  po qd Recently by his pmd due to bradycardia.  Cardiology recommended to decrease bystolic to 2.5mg  po qd.  paroxysmal afib: sinus bradycardia in the hospital, continue renal dosing xarelto, decrease bystolic dose to 2.5 qd per cardiology recommendation.  HTN; bystolic dose decreased due to bradycardia, titrate up hydralazine To 50mg  tid.  H/o recurrent dvt s/p ivc filter, Renal dosing xarelto per pharmacy/cardiology  H/o CVA: continue asa, statin, s/p recent CEA to carotid artery stenosis.  FTT/ generalized weakness/dizziness: pt eval recommended HHPT.  Per EDP patient was found hypoxic on room air, vq scan negative, cxr on acute findings, oxygen saturation wnl while walking on room air.  Arthritis: has been on tylenol and daily prednisone for a year, family reported arthritic pain well controlled on this regimen.  Anxiety/h/o PTSD: continue prn xanxex. Sob episode this afternoon, likely due to anxiety, cxr/tele unremarkable.  Urinary retention and urinary urgency: family reported these have been a chronic problem and he is closed followed with his urologist Dr. Retta Dionesdahlstedt.  increase flomax dose, add proscar, psa level wnl. myrbetriq held in the hospital due to main complain here is difficulty initiate urine.  Family reported patient has been on myrbetriq and flomax, seem has achieved a good balance between urinary urgency and urinary retention. restart myrbetriq at discharge.  MRSA colonization: decolonization with bactroban nasal cream and chlorhexidine cloth.  Family reported patient is going to  have a teeth pulled in the near further, they are aware of the need to coordination with dentist about xarelto prior to procedure.  DVT prophylaxis: on xarelto       here today as a hospital follow up.   I personally checked  his blood pressure in both arms and found it to be symmetric 150/72. He was borderline bradycardic at 60 bpm. Given his age and his dizziness, I am perfectly acceptable 150/72. He is due to recheck his renal function. He does report shortness of breath at night. Frequently he will wake up gasping for air. Apparently in the hospital he was found to be hypoxic on room air at times prior to his diuresis. He denies any chest pain or angina. He denies any shortness of breath during his daily activities. Past Medical History  Diagnosis Date  . PVD (peripheral vascular disease) (HCC)   . CAD (coronary artery disease)     a. 12/2005 - PCI to OM  . Hyperlipidemia   . Cerebrovascular accident (HCC)   . 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 GERD (gastroesophageal reflux disease)   . Edema   . Vertigo   . IBS (irritable bowel syndrome)   . Duodenitis   . Gastritis   . Esophageal stricture   . Cellulitis   . Pulmonary embolism (HCC)   . Prostatic hypertrophy   . Frequent falls   . Subdural hematoma (HCC)   . PTSD (post-traumatic stress disorder)   . Vertigo   . TIA (transient ischemic attack)     a. 01/2006 and 05/2006.   [name] Hiatal hernia     periferal neuropathy  . Hx of echocardiogram     Echo 9/13:  Mild LVH, EF 60-65%, Gr 1 diast dysfn, mild AI, mild BAE  . Gout   . Chronic low back pain   . Gait disorder   . Peripheral neuropathy (HCC)   . Hydrocele   . History of shingles     Left lumbar  . HOH (hard of hearing)     hearing aid, totally in R ear ( no hearing aid in L )   . Myocardial infarction [facility]) "years ago"    "dr said I'd had a small one"  . Daily headache   . Depression   . Urinary frequency   . Blind left eye   . Hypertension   . Dysrhythmia   . Arthritis   . Anxiety     PTSD   Past Surgical History  Procedure Laterality Date  . Hand surgery Right     "replaced bone in my finger"  . Angioplasty    . Carotid  endarterectomy Left   . Coronary angioplasty with stent placement      left circumflex  . Cataract extraction, bilateral    . Tonsillectomy    . Tympanic membrane repair Right 1978    [name] 07/27/2010; "couldn'getting so t couldn't hear; had noise in my ear; didn't correct either  . Hand contracture release Right     palm/notes 07/27/2010  . Esophagogastroduodenoscopy (egd) with esophageal dilation    . Insertion of vena cava filter  2013    h/o PE  . Endarterectomy Left 11/02/2014    Procedure: LEFT  CAROTID ARTERY ENDARTERECTOMY  WITH DACRON PATCH ANGIOPLASTY;  Surgeon: Sherren Kerns, MD;  Location: [facility] OR;  Service: Vascular;  Laterality: Left;   Current Outpatient Prescriptions on File Prior to Visit  Medication Sig Dispense Refill  . acetaminophen (TYLENOL) 500 MG tablet Take 1,000 mg by mouth 2 (two) times daily. Take every day per [facility]    . ALPRAZolam (XANAX) 0.25 MG tablet Take 1 tablet (0.25 mg total) by mouth 2 (two) times daily as needed for anxiety. 10 tablet 0  . alum & mag hydroxide-simeth (MAALOX/MYLANTA) 200-200-20 MG/5ML suspension Take 30 mLs by mouth every 6 (six) hours as needed for indigestion or heartburn.    [name] aspirin EC 81 MG EC tablet Take 1 tablet (81 mg total) by mouth daily.    [name] DEXILANT 60 MG capsule TAKE ONE CAPSULE BY MOUTH EVERY DAY 30 capsule 11  . docusate sodium (COLACE) 100 MG capsule Take 1 capsule (100 mg total) by mouth at bedtime. 30 capsule 11  . DULoxetine (CYMBALTA) 30 MG capsule TAKE ONE CAPSULE BY MOUTH EVERY DAY 30 capsule 6  . famotidine (PEPCID) 20 MG tablet Take 1 tablet (20 mg total) by mouth 2 (two) times daily. 60 tablet 0  . finasteride (PROSCAR) 5 MG tablet Take 1 tablet (5 mg total) by mouth daily. 30 tablet 0  . furosemide (LASIX) 40 MG tablet Take 1 tablet (40 mg total) by mouth every other day. 30 tablet 0  . hydrALAZINE (APRESOLINE) 50 MG tablet Take 1 tablet (50 mg total) by mouth 3 (three) times daily. 90 tablet 0  .  HYDROcodone-acetaminophen (NORCO/VICODIN) 5-325 MG tablet Take 1 tablet by mouth every 6 (six) hours as needed for moderate pain. 10 tablet 0  . latanoprost (XALATAN) 0.005 % ophthalmic solution Place 1 drop into both eyes at bedtime.    [name] loperamide (IMODIUM) 2 MG capsule Take 2 mg by mouth as needed for diarrhea or loose stools (Not to exceed 8 doses in 24 hours).    . magnesium hydroxide (MILK OF MAGNESIA) 400 MG/5ML suspension Take 30 mLs by mouth at bedtime as needed for mild constipation.    . meclizine (ANTIVERT) 12.5 MG tablet Take 12.5 mg by mouth every 12 (twelve) hours as needed for dizziness.    . mupirocin ointment (BACTROBAN) 2 % Place into the nose 2 (two) times daily. 22 g 0  . MYRBETRIQ 25 MG TB24 tablet TAKE 1 TABLET BY MOUTH EVERY DAY 30 tablet 11  . nebivolol (BYSTOLIC) 2.5 MG tablet Take 1 tablet (2.5 mg total) by mouth daily. 30 tablet 0  . nitroGLYCERIN (NITROSTAT) 0.4 MG SL tablet Place 0.4 mg under the tongue every 5 (five) minutes as needed. Chest pain    . ondansetron (ZOFRAN) 4 MG tablet Take 4 mg by mouth every 8 (eight) hours as needed for nausea.    . polyethylene glycol powder (GLYCOLAX/MIRALAX) powder MIX 17 GRAMS WITH 4 TO 6 OUNCES OF WATER AND DRINK ONCE DAILY AS NEEDED FOR CONSTIPATION 527 g 2  . polyvinyl alcohol (LIQUIFILM TEARS) 1.4 % ophthalmic solution Place 2 drops into both eyes 3 (three) times daily.    . predniSONE (DELTASONE) 10 MG tablet Take 1 tablet (10 mg total) by mouth daily with breakfast. 30 tablet 5  . senna-docusate (SENOKOT-S) 8.6-50 MG tablet Take 1 tablet by mouth at bedtime as needed for mild constipation. 30 tablet 0  . simvastatin (ZOCOR) 40 MG tablet Take 1 tablet (40 mg total) by mouth daily at 6 PM.    . sodium chloride (OCEAN) 0.65 % SOLN nasal spray Place 1 spray into both nostrils 2 (two) times daily. For 1 week 1 Bottle 0  . tamsulosin (FLOMAX) 0.4  MG CAPS capsule Take 2 capsules (0.8 mg total) by mouth daily. 30 capsule 0  .  XARELTO 15 MG TABS tablet TAKE 1 TABLET BY MOUTH EVERY DAY WITH SUPPER 30 tablet 6   No current facility-administered medications on file prior to visit.   Allergies  Allergen Reactions  . Ativan [Lorazepam] Other (See Comments)    unknown  . Augmentin [Amoxicillin-Pot Clavulanate] Nausea And Vomiting    daughter states he can tolerate Amoxicillin ok Profuse vomiting. Unable to answer penicillin questions as pt is from Nursing home and unable to provide answers to questions  . Belladonna Alkaloids     Unknown- on MAR  . Corticosteroids     Unknown- on MAR  . Fludrocortisone Acetate Other (See Comments)    "leg swelling"  . Parabens     Unknown- on MAR  . Scopace [Scopolamine] Other (See Comments)    *patch "Made him crazy" per patients daughter    Social History   Social History  . Marital Status: Married    Spouse Name: N/A  . Number of Children: 2  . Years of Education: HS   Occupational History  . retired    Social History Main Topics  . Smoking status: Never Smoker   . Smokeless tobacco: Never Used  . Alcohol Use: 0.0 oz/week    0 Standard drinks or equivalent per week     Comment: wine occasionally  . Drug Use: No  . Sexual Activity: No   Other Topics Concern  . 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   Patient drinks about 2 cups of caffeine daily.     Review of Systems  All other systems reviewed and are negative.      Objective:   Physical Exam  Constitutional: He appears well-developed and well-nourished.  Neck: Neck supple.  Cardiovascular: Normal rate and regular rhythm.   Murmur heard. Pulmonary/Chest: Effort normal and breath sounds normal. No respiratory distress. He has no wheezes. He has no rales.  Abdominal: Soft. Bowel sounds are normal. He exhibits no distension. There is no tenderness. There is no rebound.  Musculoskeletal: He exhibits no  edema.  Vitals reviewed.         Assessment & Plan:  Essential hypertension - Plan: BASIC METABOLIC PANEL WITH GFR  Hospital discharge follow-up  CKD (chronic kidney disease), stage 3 (moderate)  Nocturnal hypoxia   I will recheck his renal function. There is no evidence of fluid overload today on exam even taking Lasix 40 mg every other day. Therefore his creatinine is stable, I will make no changes in his dose of diuretic. His blood pressure is acceptable given his age. I am very happy with a blood pressure of 150 systolic. I will make no changes in his blood pressure medication at this time. I will arrange an overnight pulse oximetry and if the patient is having significant hypoxia, I will arrange for oxygen at night.

## 2015-03-02 ENCOUNTER — Telehealth: Payer: Self-pay | Admitting: *Deleted

## 2015-03-02 DIAGNOSIS — M542 Cervicalgia: Secondary | ICD-10-CM | POA: Diagnosis not present

## 2015-03-02 DIAGNOSIS — M549 Dorsalgia, unspecified: Secondary | ICD-10-CM | POA: Diagnosis not present

## 2015-03-02 DIAGNOSIS — I739 Peripheral vascular disease, unspecified: Secondary | ICD-10-CM | POA: Diagnosis not present

## 2015-03-02 DIAGNOSIS — R42 Dizziness and giddiness: Secondary | ICD-10-CM | POA: Diagnosis not present

## 2015-03-02 DIAGNOSIS — I480 Paroxysmal atrial fibrillation: Secondary | ICD-10-CM | POA: Diagnosis not present

## 2015-03-02 DIAGNOSIS — R627 Adult failure to thrive: Secondary | ICD-10-CM | POA: Diagnosis not present

## 2015-03-02 DIAGNOSIS — N183 Chronic kidney disease, stage 3 (moderate): Secondary | ICD-10-CM | POA: Diagnosis not present

## 2015-03-02 DIAGNOSIS — K5909 Other constipation: Secondary | ICD-10-CM | POA: Diagnosis not present

## 2015-03-02 DIAGNOSIS — I130 Hypertensive heart and chronic kidney disease with heart failure and stage 1 through stage 4 chronic kidney disease, or unspecified chronic kidney disease: Secondary | ICD-10-CM | POA: Diagnosis not present

## 2015-03-02 DIAGNOSIS — R531 Weakness: Secondary | ICD-10-CM | POA: Diagnosis not present

## 2015-03-02 DIAGNOSIS — I5030 Unspecified diastolic (congestive) heart failure: Secondary | ICD-10-CM | POA: Diagnosis not present

## 2015-03-02 DIAGNOSIS — G609 Hereditary and idiopathic neuropathy, unspecified: Secondary | ICD-10-CM | POA: Diagnosis not present

## 2015-03-02 DIAGNOSIS — I2510 Atherosclerotic heart disease of native coronary artery without angina pectoris: Secondary | ICD-10-CM | POA: Diagnosis not present

## 2015-03-02 LAB — BASIC METABOLIC PANEL WITH GFR
BUN: 31 mg/dL — ABNORMAL HIGH (ref 7–25)
CALCIUM: 8.7 mg/dL (ref 8.6–10.3)
CO2: 26 mmol/L (ref 20–31)
Chloride: 104 mmol/L (ref 98–110)
Creat: 1.78 mg/dL — ABNORMAL HIGH (ref 0.70–1.11)
GFR, EST NON AFRICAN AMERICAN: 32 mL/min — AB (ref >=60)
GFR, Est African American: 37 mL/min — ABNORMAL LOW (ref >=60)
GLUCOSE: 102 mg/dL — AB (ref 70–99)
Potassium: 4.8 mmol/L (ref 3.5–5.3)
Sodium: 139 mmol/L (ref 135–146)

## 2015-03-02 NOTE — Telephone Encounter (Signed)
I followed up with patient for 3 -month follow-up for REDS@ Discharge Study by phone on 02/26/15. I spoke to daughter Jason [name].. She told me patient has not been in hospital for congestive heart failure.I told her to thank father for participating in REDS@ Discharge Study.

## 2015-03-03 ENCOUNTER — Other Ambulatory Visit: Payer: Self-pay | Admitting: Family Medicine

## 2015-03-03 DIAGNOSIS — G4734 Idiopathic sleep related nonobstructive alveolar hypoventilation: Secondary | ICD-10-CM

## 2015-03-04 ENCOUNTER — Ambulatory Visit (INDEPENDENT_AMBULATORY_CARE_PROVIDER_SITE_OTHER): Payer: Medicare Other | Admitting: Neurology

## 2015-03-04 ENCOUNTER — Other Ambulatory Visit: Payer: Self-pay | Admitting: *Deleted

## 2015-03-04 ENCOUNTER — Encounter: Payer: Self-pay | Admitting: Neurology

## 2015-03-04 VITALS — BP 142/65 | HR 59 | Ht 72.0 in | Wt 195.0 lb

## 2015-03-04 DIAGNOSIS — N183 Chronic kidney disease, stage 3 (moderate): Secondary | ICD-10-CM | POA: Diagnosis not present

## 2015-03-04 DIAGNOSIS — M549 Dorsalgia, unspecified: Secondary | ICD-10-CM | POA: Diagnosis not present

## 2015-03-04 DIAGNOSIS — I5030 Unspecified diastolic (congestive) heart failure: Secondary | ICD-10-CM | POA: Diagnosis not present

## 2015-03-04 DIAGNOSIS — I69359 Hemiplegia and hemiparesis following cerebral infarction affecting unspecified side: Secondary | ICD-10-CM

## 2015-03-04 DIAGNOSIS — G609 Hereditary and idiopathic neuropathy, unspecified: Secondary | ICD-10-CM

## 2015-03-04 DIAGNOSIS — I130 Hypertensive heart and chronic kidney disease with heart failure and stage 1 through stage 4 chronic kidney disease, or unspecified chronic kidney disease: Secondary | ICD-10-CM | POA: Diagnosis not present

## 2015-03-04 DIAGNOSIS — R269 Unspecified abnormalities of gait and mobility: Secondary | ICD-10-CM

## 2015-03-04 DIAGNOSIS — K5909 Other constipation: Secondary | ICD-10-CM | POA: Diagnosis not present

## 2015-03-04 DIAGNOSIS — M542 Cervicalgia: Secondary | ICD-10-CM | POA: Diagnosis not present

## 2015-03-04 DIAGNOSIS — I480 Paroxysmal atrial fibrillation: Secondary | ICD-10-CM | POA: Diagnosis not present

## 2015-03-04 DIAGNOSIS — R531 Weakness: Secondary | ICD-10-CM | POA: Diagnosis not present

## 2015-03-04 DIAGNOSIS — I69398 Other sequelae of cerebral infarction: Secondary | ICD-10-CM | POA: Diagnosis not present

## 2015-03-04 DIAGNOSIS — R42 Dizziness and giddiness: Secondary | ICD-10-CM | POA: Diagnosis not present

## 2015-03-04 DIAGNOSIS — I739 Peripheral vascular disease, unspecified: Secondary | ICD-10-CM | POA: Diagnosis not present

## 2015-03-04 DIAGNOSIS — R627 Adult failure to thrive: Secondary | ICD-10-CM | POA: Diagnosis not present

## 2015-03-04 DIAGNOSIS — I2510 Atherosclerotic heart disease of native coronary artery without angina pectoris: Secondary | ICD-10-CM | POA: Diagnosis not present

## 2015-03-04 HISTORY — DX: Hemiplegia and hemiparesis following cerebral infarction affecting unspecified side: I69.359

## 2015-03-04 MED ORDER — HYDROCODONE-ACETAMINOPHEN 5-325 MG PO TABS: 1.0000 | ORAL_TABLET | Freq: Four times a day (QID) | ORAL | Status: DC | PRN

## 2015-03-04 MED ORDER — ALPRAZOLAM 0.25 MG PO TABS: 0.2500 mg | ORAL_TABLET | Freq: Two times a day (BID) | ORAL | Status: DC | PRN

## 2015-03-04 NOTE — Patient Instructions (Signed)
Fall Prevention in the Home  Falls can cause injuries and can affect people from all age groups. There are many simple things that you can do to make your home safe and to help prevent falls. WHAT CAN I DO ON THE OUTSIDE OF MY HOME?  Regularly repair the edges of walkways and driveways and fix any cracks.  Remove high doorway thresholds.  Trim any shrubbery on the main path into your home.  Use bright outdoor lighting.  Clear walkways of debris and clutter, including tools and rocks.  Regularly check that handrails are securely fastened and in good repair. Both sides of any steps should have handrails.  Install guardrails along the edges of any raised decks or porches.  Have leaves, snow, and ice cleared regularly.  Use sand or salt on walkways during winter months.  In the garage, clean up any spills right away, including grease or oil spills. WHAT CAN I DO IN THE BATHROOM?  Use night lights.  Install grab bars by the toilet and in the tub and shower. Do not use towel bars as grab bars.  Use non-skid mats or decals on the floor of the tub or shower.  If you need to sit down while you are in the shower, use a plastic, non-slip stool..  Keep the floor dry. Immediately clean up any water that spills on the floor.  Remove soap buildup in the tub or shower on a regular basis.  Attach bath mats securely with double-sided non-slip rug tape.  Remove throw rugs and other tripping hazards from the floor. WHAT CAN I DO IN THE BEDROOM?  Use night lights.  Make sure that a bedside light is easy to reach.  Do not use oversized bedding that drapes onto the floor.  Have a firm chair that has side arms to use for getting dressed.  Remove throw rugs and other tripping hazards from the floor. WHAT CAN I DO IN THE KITCHEN?   Clean up any spills right away.  Avoid walking on wet floors.  Place frequently used items in easy-to-reach places.  If you need to reach for something  above you, use a sturdy step stool that has a grab bar.  Keep electrical cables out of the way.  Do not use floor polish or wax that makes floors slippery. If you have to use wax, make sure that it is non-skid floor wax.  Remove throw rugs and other tripping hazards from the floor. WHAT CAN I DO IN THE STAIRWAYS?  Do not leave any items on the stairs.  Make sure that there are handrails on both sides of the stairs. Fix handrails that are broken or loose. Make sure that handrails are as long as the stairways.  Check any carpeting to make sure that it is firmly attached to the stairs. Fix any carpet that is loose or worn.  Avoid having throw rugs at the top or bottom of stairways, or secure the rugs with carpet tape to prevent them from moving.  Make sure that you have a light switch at the top of the stairs and the bottom of the stairs. If you do not have them, have them installed. WHAT ARE SOME OTHER FALL PREVENTION TIPS?  Wear closed-toe shoes that fit well and support your feet. Wear shoes that have rubber soles or low heels.  When you use a stepladder, make sure that it is completely opened and that the sides are firmly locked. Have someone hold the ladder while you   are using it. Do not climb a closed stepladder.  Add color or contrast paint or tape to grab bars and handrails in your home. Place contrasting color strips on the first and last steps.  Use mobility aids as needed, such as canes, walkers, scooters, and crutches.  Turn on lights if it is dark. Replace any light bulbs that burn out.  Set up furniture so that there are clear paths. Keep the furniture in the same spot.  Fix any uneven floor surfaces.  Choose a carpet design that does not hide the edge of steps of a stairway.  Be aware of any and all pets.  Review your medicines with your healthcare provider. Some medicines can cause dizziness or changes in blood pressure, which increase your risk of falling. Talk  with your health care provider about other ways that you can decrease your risk of falls. This may include working with a physical therapist or trainer to improve your strength, balance, and endurance.   This information is not intended to replace advice given to you by your health care provider. Make sure you discuss any questions you have with your health care provider.   Document Released: 02/17/2002 Document Revised: 07/14/2014 Document Reviewed: 04/03/2014 Elsevier Interactive Patient Education 2016 [employer]  

## 2015-03-04 NOTE — Telephone Encounter (Signed)
Received fax requesting refill on Norco and Xanax.   MD made aware and approved.   [facility]

## 2015-03-04 NOTE — Progress Notes (Signed)
Reason for visit: cerebrovascular disease  Jason [name] is an 81 y.o. male  History of present illness:  Jason [name] is a 81 year old right-handed white male with a history of chronic dizziness and a chronic gait disorder. The patient was found to have a high-grade left carotid stenosis when last seen in May 2016. The patient was sent for a carotid endarterectomy which was done around August 23 of 2016. The patient has had multiple admissions to the hospital since that time. He was admitted on September 5 with increasing peripheral edema and congestive heart failure. He was admitted to the hospital on the 11th of October 2016 with a right centrum semiovale and a right cerebellar stroke event, presenting with dizziness and slurred speech. He was admitted on December 2 with hypoxia and acute renal failure. The patient has had some increased dizziness since that time, he currently is in physical therapy, he walks with a walker, he reports no recent falls. He has been placed on Xarelto and low-dose aspirin. He has some nausea associated with the dizziness. The dizziness is somewhat worse now than what it was prior to surgery. He denies any blackout episodes. He returns for an evaluation.  Past Medical History  Diagnosis Date  . PVD (peripheral vascular disease) (HCC)   . CAD (coronary artery disease)     a. 12/2005 - PCI to OM  . Hyperlipidemia   . Cerebrovascular accident (HCC)   . DVT femoral (deep venous thrombosis) with thrombophlebitis (HCC) 03/2007    Jason [name] 07/13/2010  . GERD (gastroesophageal reflux disease)   . Edema   . Vertigo   . IBS (irritable bowel syndrome)   . Duodenitis   . Gastritis   . Esophageal stricture   . Cellulitis   . Pulmonary embolism (HCC)   . Prostatic hypertrophy   . Frequent falls   . Subdural hematoma (HCC)   . PTSD (post-traumatic stress disorder)   . Vertigo   . TIA (transient ischemic attack)     a. 01/2006 and 05/2006.   [name] Hiatal hernia     periferal  neuropathy  . Hx of echocardiogram     Echo 9/13:  Mild LVH, EF 60-65%, Gr 1 diast dysfn, mild AI, mild BAE  . Gout   . Chronic low back pain   . Gait disorder   . Peripheral neuropathy (HCC)   . Hydrocele   . History of shingles     Left lumbar  . HOH (hard of hearing)     hearing aid, totally in R ear ( no hearing aid in L )   . Myocardial infarction [facility]) "years ago"    "dr said I'd had a small one"  . Daily headache   . Depression   . Urinary frequency   . Blind left eye   . Hypertension   . Dysrhythmia   . Arthritis   . Anxiety     PTSD  . Hemiparesis and alteration of sensations as late effects of stroke (HCC) 03/04/2015    Past Surgical History  Procedure Laterality Date  . Hand surgery Right     "replaced bone in my finger"  . Angioplasty    . Carotid endarterectomy Left   . Coronary angioplasty with stent placement      left circumflex  . Cataract extraction, bilateral    . Tonsillectomy    . Tympanic membrane repair Right 1978    Jason [name] 07/27/2010; "couldn'getting so t couldn't hear; had noise in my  ear; didn't correct either  . Hand contracture release Right     palm/notes 07/27/2010  . Esophagogastroduodenoscopy (egd) with esophageal dilation    . Insertion of vena cava filter  2013    h/o PE  . Endarterectomy Left 11/02/2014    Procedure: LEFT  CAROTID ARTERY ENDARTERECTOMY  WITH DACRON PATCH ANGIOPLASTY;  Surgeon: Sherren Kernsharles E Fields, MD;  [facility];  Service: Vascular;  Laterality: Left;    Family History  Problem Relation Age of Onset  . Colon cancer Neg Hx   . Coronary artery disease Brother   . Heart attack Father   . Migraines Mother   . Breast cancer Sister   . Dementia Sister   . Renal Disease Brother     Social history:  reports that he has never smoked. He has never used smokeless tobacco. He reports that he drinks alcohol. He reports that he does not use illicit drugs.    Allergies  Allergen Reactions  . Ativan [Lorazepam] Other  (See Comments)    unknown  . Augmentin [Amoxicillin-Pot Clavulanate] Nausea And Vomiting    daughter states he can tolerate Amoxicillin ok Profuse vomiting. Unable to answer penicillin questions as pt is from Nursing home and unable to provide answers to questions  . Belladonna Alkaloids     Unknown- on MAR  . Corticosteroids     Unknown- on MAR  . Fludrocortisone Acetate Other (See Comments)    "leg swelling"  . Parabens     Unknown- on MAR  . Scopace [Scopolamine] Other (See Comments)    *patch "Made him crazy" per patients daughter     Medications:  Prior to Admission medications   Medication Sig Start Date End Date Taking? Authorizing Provider  acetaminophen (TYLENOL) 500 MG tablet Take 1,000 mg by mouth 2 (two) times daily. Take every day [facility]   Yes Historical Provider, MD  ALPRAZolam (XANAX) 0.25 MG tablet Take 1 tablet (0.25 mg total) by mouth 2 (two) times daily as needed for anxiety. 03/04/15  Yes Donita BrooksWarren T [name], MD  alum & mag hydroxide-simeth (MAALOX/MYLANTA) 200-200-20 MG/5ML suspension Take 30 mLs by mouth every 6 (six) hours as needed for indigestion or heartburn.   Yes Historical Provider, MD  aspirin EC 81 MG EC tablet Take 1 tablet (81 mg total) by mouth daily. 12/23/14  Yes Shanker Levora DredgeM Ghimire, MD  DEXILANT 60 MG capsule TAKE ONE CAPSULE BY MOUTH EVERY DAY 03/16/14  Yes Donita BrooksWarren T [name], MD  docusate sodium (COLACE) 100 MG capsule Take 1 capsule (100 mg total) by mouth at bedtime. 07/14/14  Yes Donita BrooksWarren T [name], MD  DULoxetine (CYMBALTA) 30 MG capsule TAKE ONE CAPSULE BY MOUTH EVERY DAY 09/07/14  Yes York Spanielharles K Tresean Mattix, MD  famotidine (PEPCID) 20 MG tablet Take 1 tablet (20 mg total) by mouth 2 (two) times daily. 02/14/15  Yes Albertine GratesFang Xu, MD  finasteride (PROSCAR) 5 MG tablet Take 1 tablet (5 mg total) by mouth daily. 02/14/15  Yes Albertine GratesFang Xu, MD  furosemide (LASIX) 40 MG tablet Take 1 tablet (40 mg total) by mouth every other day. 02/14/15  Yes Albertine GratesFang Xu, MD  hydrALAZINE  (APRESOLINE) 50 MG tablet Take 1 tablet (50 mg total) by mouth 3 (three) times daily. 02/14/15  Yes Albertine GratesFang Xu, MD  HYDROcodone-acetaminophen (NORCO/VICODIN) 5-325 MG tablet Take 1 tablet by mouth every 6 (six) hours as needed for moderate pain. 03/04/15  Yes Donita BrooksWarren T [name], MD  latanoprost (XALATAN) 0.005 % ophthalmic solution Place 1 drop into  both eyes at bedtime.   Yes Historical Provider, MD  loperamide (IMODIUM) 2 MG capsule Take 2 mg by mouth as needed for diarrhea or loose stools (Not to exceed 8 doses in 24 hours).   Yes Historical Provider, MD  magnesium hydroxide (MILK OF MAGNESIA) 400 MG/5ML suspension Take 30 mLs by mouth at bedtime as needed for mild constipation.   Yes Historical Provider, MD  meclizine (ANTIVERT) 12.5 MG tablet Take 12.5 mg by mouth every 12 (twelve) hours as needed for dizziness.   Yes Historical Provider, MD  mupirocin ointment (BACTROBAN) 2 % Place into the nose 2 (two) times daily. 02/14/15  Yes Albertine Grates, MD  MYRBETRIQ 25 MG TB24 tablet TAKE 1 TABLET BY MOUTH EVERY DAY 07/13/14  Yes Donita Brooks, MD  nebivolol (BYSTOLIC) 2.5 MG tablet Take 1 tablet (2.5 mg total) by mouth daily. 02/14/15  Yes Albertine Grates, MD  nitroGLYCERIN (NITROSTAT) 0.4 MG SL tablet Place 0.4 mg under the tongue every 5 (five) minutes as needed. Chest pain   Yes Historical Provider, MD  ondansetron (ZOFRAN) 4 MG tablet Take 4 mg by mouth every 8 (eight) hours as needed for nausea.   Yes Historical Provider, MD  polyethylene glycol powder (GLYCOLAX/MIRALAX) powder MIX 17 GRAMS WITH 4 TO 6 OUNCES OF WATER AND DRINK ONCE DAILY AS NEEDED FOR CONSTIPATION 03/07/13  Yes Donita Brooks, MD  polyvinyl alcohol (LIQUIFILM TEARS) 1.4 % ophthalmic solution Place 2 drops into both eyes 3 (three) times daily.   Yes Historical Provider, MD  predniSONE (DELTASONE) 10 MG tablet Take 1 tablet (10 mg total) by mouth daily with breakfast. 09/22/14  Yes Donita Brooks, MD  senna-docusate (SENOKOT-S) 8.6-50 MG tablet Take  1 tablet by mouth at bedtime as needed for mild constipation. 02/14/15  Yes Albertine Grates, MD  simvastatin (ZOCOR) 40 MG tablet Take 1 tablet (40 mg total) by mouth daily at 6 PM. 12/23/14  Yes Shanker Levora Dredge, MD  sodium chloride (OCEAN) 0.65 % SOLN nasal spray Place 1 spray into both nostrils 2 (two) times daily. For 1 week 02/09/15  Yes Salley Scarlet, MD  tamsulosin (FLOMAX) 0.4 MG CAPS capsule Take 2 capsules (0.8 mg total) by mouth daily. 02/14/15  Yes Albertine Grates, MD  XARELTO 15 MG TABS tablet TAKE 1 TABLET BY MOUTH EVERY DAY WITH SUPPER 10/06/14  Yes Tonny Bollman, MD    ROS:  Out of a complete 14 system review of symptoms, the patient complains only of the following symptoms, and all other reviewed systems are negative.  Fatigue Hearing loss, ringing in the ears Eye itching, eye redness, eye pain, blurred vision Shortness of breath Nausea Insomnia, apnea, frequent waking Difficulty urinating Joint pain, back pain, achy muscles, walking difficulty, neck pain, neck stiffness Bruising easily Dizziness, weakness  Blood pressure 142/65, pulse 59, height 6' (1.829 m), weight 195 lb (88.451 kg).  Physical Exam  General: The patient is alert and cooperative at the time of the examination.  Skin: No significant peripheral edema is noted.   Neurologic Exam  Mental status: The patient is alert and oriented x 3 at the time of the examination. The patient has apparent normal recent and remote memory, with an apparently normal attention span and concentration ability.   Cranial nerves: Facial symmetry is present. Speech is normal, no aphasia or dysarthria is noted. Extraocular movements are full. Visual fields are full.  Motor: The patient has good strength in all 4 extremities.  Sensory examination: Soft touch  sensation is symmetric on the face, arms, and legs. There is some decreased sensation below the knees bilaterally.   Coordination: The patient has good finger-nose-finger and  heel-to-shin bilaterally.  Gait and station: The patient has some difficulty arising from a seated position. Once up, he is able to walk with a walker. He has a slight tendency to lean backwards. Tandem gait was not attempted. Romberg is negative.  Reflexes: Deep tendon reflexes are symmetric.   MRI brain 12/21/2014:  IMPRESSION: 1. 13 mm acute nonhemorrhagic ischemic infarct within the deep white matter of the right centrum semi ovale. 2. 7 mm acute nonhemorrhagic ischemic infarct within the right cerebellar hemisphere. 3. Encephalomalacia within the bilateral parietal lobes and right frontal lobe, likely related to remote ischemia. Additional remote lacunar infarct within the right caudate head. 4. Generalized age-related cerebral atrophy with chronic microvascular ischemic disease.  * MRI scan images were reviewed online. I agree with the written report.    Assessment/Plan:  1. Cerebrovascular disease, left carotid endarterectomy  2. Chronic dizziness  3. Gait disorder  The patient is on maximal medical therapy currently for cerebrovascular disease. He continues to have dizziness that has actually worsened after the carotid endarterectomy. He is on Xarelto and aspirin he is to continue his physical therapy, I have little else to offer him for the symptoms he is having. He is on some meclizine to take if he needs for the dizziness. He will follow-up in 6 months, sooner if needed.  Marlan Palau MD 03/05/2015 8:52 AM  [facility] [address] [place], [place] [address]  Phone [phone] Fax [phone]

## 2015-03-09 ENCOUNTER — Telehealth: Payer: Self-pay | Admitting: *Deleted

## 2015-03-09 DIAGNOSIS — G609 Hereditary and idiopathic neuropathy, unspecified: Secondary | ICD-10-CM | POA: Diagnosis not present

## 2015-03-09 DIAGNOSIS — M542 Cervicalgia: Secondary | ICD-10-CM | POA: Diagnosis not present

## 2015-03-09 DIAGNOSIS — I2510 Atherosclerotic heart disease of native coronary artery without angina pectoris: Secondary | ICD-10-CM | POA: Diagnosis not present

## 2015-03-09 DIAGNOSIS — N183 Chronic kidney disease, stage 3 (moderate): Secondary | ICD-10-CM | POA: Diagnosis not present

## 2015-03-09 DIAGNOSIS — I5030 Unspecified diastolic (congestive) heart failure: Secondary | ICD-10-CM | POA: Diagnosis not present

## 2015-03-09 DIAGNOSIS — I130 Hypertensive heart and chronic kidney disease with heart failure and stage 1 through stage 4 chronic kidney disease, or unspecified chronic kidney disease: Secondary | ICD-10-CM | POA: Diagnosis not present

## 2015-03-09 DIAGNOSIS — M549 Dorsalgia, unspecified: Secondary | ICD-10-CM | POA: Diagnosis not present

## 2015-03-09 DIAGNOSIS — R531 Weakness: Secondary | ICD-10-CM | POA: Diagnosis not present

## 2015-03-09 DIAGNOSIS — I480 Paroxysmal atrial fibrillation: Secondary | ICD-10-CM | POA: Diagnosis not present

## 2015-03-09 DIAGNOSIS — R627 Adult failure to thrive: Secondary | ICD-10-CM | POA: Diagnosis not present

## 2015-03-09 DIAGNOSIS — R42 Dizziness and giddiness: Secondary | ICD-10-CM | POA: Diagnosis not present

## 2015-03-09 DIAGNOSIS — K5909 Other constipation: Secondary | ICD-10-CM | POA: Diagnosis not present

## 2015-03-09 DIAGNOSIS — I739 Peripheral vascular disease, unspecified: Secondary | ICD-10-CM | POA: Diagnosis not present

## 2015-03-09 NOTE — Telephone Encounter (Signed)
Received call from HenagarKristin, PT [facility]   Reports that BP was obtained today and noted elevated: 168/ 75, HR 48 with automatic arm cuff. After approximately 15 minutes of standing exercises, BO was re-checked and noted at 153/69, HR 48 with automatic arm cuff.  MD to be made aware.

## 2015-03-09 NOTE — Telephone Encounter (Signed)
Aware. Nochanges to meds.

## 2015-03-11 ENCOUNTER — Telehealth: Payer: Self-pay | Admitting: Family Medicine

## 2015-03-11 DIAGNOSIS — G4734 Idiopathic sleep related nonobstructive alveolar hypoventilation: Secondary | ICD-10-CM

## 2015-03-11 NOTE — Telephone Encounter (Signed)
HH called to inform of BP on pt and pulse - BP- 172/90 and Pulse 44. The home is checking his BP but since she was there and it was abnormal she has to call and report it.

## 2015-03-16 ENCOUNTER — Telehealth: Payer: Self-pay | Admitting: *Deleted

## 2015-03-16 ENCOUNTER — Other Ambulatory Visit: Payer: Self-pay | Admitting: Family Medicine

## 2015-03-16 DIAGNOSIS — R0600 Dyspnea, unspecified: Secondary | ICD-10-CM | POA: Diagnosis not present

## 2015-03-16 DIAGNOSIS — N183 Chronic kidney disease, stage 3 (moderate): Secondary | ICD-10-CM | POA: Diagnosis not present

## 2015-03-16 DIAGNOSIS — I130 Hypertensive heart and chronic kidney disease with heart failure and stage 1 through stage 4 chronic kidney disease, or unspecified chronic kidney disease: Secondary | ICD-10-CM | POA: Diagnosis not present

## 2015-03-16 DIAGNOSIS — R531 Weakness: Secondary | ICD-10-CM | POA: Diagnosis not present

## 2015-03-16 DIAGNOSIS — I5030 Unspecified diastolic (congestive) heart failure: Secondary | ICD-10-CM | POA: Diagnosis not present

## 2015-03-16 NOTE — Telephone Encounter (Signed)
Received call from [place], [place] [facility].   Reports that patient has open skin tear to R knee. Requested [facility] to eval and treat as indicated.   Order faxed to facility.

## 2015-03-17 DIAGNOSIS — I252 Old myocardial infarction: Secondary | ICD-10-CM | POA: Diagnosis not present

## 2015-03-17 DIAGNOSIS — I2510 Atherosclerotic heart disease of native coronary artery without angina pectoris: Secondary | ICD-10-CM | POA: Diagnosis not present

## 2015-03-17 DIAGNOSIS — I10 Essential (primary) hypertension: Secondary | ICD-10-CM | POA: Diagnosis not present

## 2015-03-18 ENCOUNTER — Telehealth: Payer: Self-pay | Admitting: Family Medicine

## 2015-03-18 DIAGNOSIS — R531 Weakness: Secondary | ICD-10-CM | POA: Diagnosis not present

## 2015-03-18 DIAGNOSIS — I5030 Unspecified diastolic (congestive) heart failure: Secondary | ICD-10-CM | POA: Diagnosis not present

## 2015-03-18 DIAGNOSIS — I130 Hypertensive heart and chronic kidney disease with heart failure and stage 1 through stage 4 chronic kidney disease, or unspecified chronic kidney disease: Secondary | ICD-10-CM | POA: Diagnosis not present

## 2015-03-18 DIAGNOSIS — N183 Chronic kidney disease, stage 3 (moderate): Secondary | ICD-10-CM | POA: Diagnosis not present

## 2015-03-18 NOTE — Telephone Encounter (Signed)
Oxygen was delivered but they have no orders for use.  Please fax Oxygen order with flow rate.

## 2015-03-18 NOTE — Telephone Encounter (Signed)
[facility] - and who needs orders and where do I fax it to??

## 2015-03-18 NOTE — Telephone Encounter (Signed)
Orders faxed

## 2015-03-18 NOTE — Telephone Encounter (Signed)
[facility] 2 L via nasal cannula for use at night - DX - nocturnal hypoxia

## 2015-03-23 DIAGNOSIS — N183 Chronic kidney disease, stage 3 (moderate): Secondary | ICD-10-CM | POA: Diagnosis not present

## 2015-03-23 DIAGNOSIS — I130 Hypertensive heart and chronic kidney disease with heart failure and stage 1 through stage 4 chronic kidney disease, or unspecified chronic kidney disease: Secondary | ICD-10-CM | POA: Diagnosis not present

## 2015-03-23 DIAGNOSIS — I5030 Unspecified diastolic (congestive) heart failure: Secondary | ICD-10-CM | POA: Diagnosis not present

## 2015-03-23 DIAGNOSIS — R531 Weakness: Secondary | ICD-10-CM | POA: Diagnosis not present

## 2015-03-24 DIAGNOSIS — L8990 Pressure ulcer of unspecified site, unspecified stage: Secondary | ICD-10-CM | POA: Diagnosis not present

## 2015-03-25 ENCOUNTER — Telehealth: Payer: Self-pay | Admitting: *Deleted

## 2015-03-25 DIAGNOSIS — N183 Chronic kidney disease, stage 3 (moderate): Secondary | ICD-10-CM | POA: Diagnosis not present

## 2015-03-25 DIAGNOSIS — R531 Weakness: Secondary | ICD-10-CM | POA: Diagnosis not present

## 2015-03-25 DIAGNOSIS — I5030 Unspecified diastolic (congestive) heart failure: Secondary | ICD-10-CM | POA: Diagnosis not present

## 2015-03-25 DIAGNOSIS — I130 Hypertensive heart and chronic kidney disease with heart failure and stage 1 through stage 4 chronic kidney disease, or unspecified chronic kidney disease: Secondary | ICD-10-CM | POA: Diagnosis not present

## 2015-03-25 MED ORDER — DOXAZOSIN MESYLATE 4 MG PO TABS: 4.0000 mg | ORAL_TABLET | Freq: Every day | ORAL | Status: AC

## 2015-03-25 NOTE — Telephone Encounter (Signed)
[facility]   Pharmacy recommendations are as follows:  1. D/C Bystolic 2.5mg  and begin Bystolic 5mg  PO QD D/T HTN.  2.       A) Evaluate risk/ benefit of continuing Zocor 40mg .       B) Draw CMP, CBC, Lipid panel on next lab day to monitor therapy.  MD reviewed and recommendations are as follows: 1. Continue Bystolic 2.5mg  PO QD D/T bradycardia.  2.      A) Continue Zocor 40mg       B) draw recommended labs to monitor therapy 3. D/C Flomax. Begin Cardura 4mg  PO QD- BPH/ HTN.   Order faxed to facility.

## 2015-03-29 DIAGNOSIS — I130 Hypertensive heart and chronic kidney disease with heart failure and stage 1 through stage 4 chronic kidney disease, or unspecified chronic kidney disease: Secondary | ICD-10-CM | POA: Diagnosis not present

## 2015-03-29 DIAGNOSIS — I5030 Unspecified diastolic (congestive) heart failure: Secondary | ICD-10-CM | POA: Diagnosis not present

## 2015-03-29 DIAGNOSIS — R531 Weakness: Secondary | ICD-10-CM | POA: Diagnosis not present

## 2015-03-29 DIAGNOSIS — N183 Chronic kidney disease, stage 3 (moderate): Secondary | ICD-10-CM | POA: Diagnosis not present

## 2015-04-01 ENCOUNTER — Encounter: Payer: Self-pay | Admitting: Internal Medicine

## 2015-04-01 ENCOUNTER — Encounter: Payer: Self-pay | Admitting: Cardiology

## 2015-04-01 DIAGNOSIS — I130 Hypertensive heart and chronic kidney disease with heart failure and stage 1 through stage 4 chronic kidney disease, or unspecified chronic kidney disease: Secondary | ICD-10-CM | POA: Diagnosis not present

## 2015-04-01 DIAGNOSIS — N183 Chronic kidney disease, stage 3 (moderate): Secondary | ICD-10-CM | POA: Diagnosis not present

## 2015-04-01 DIAGNOSIS — R531 Weakness: Secondary | ICD-10-CM | POA: Diagnosis not present

## 2015-04-01 DIAGNOSIS — I5030 Unspecified diastolic (congestive) heart failure: Secondary | ICD-10-CM | POA: Diagnosis not present

## 2015-04-05 ENCOUNTER — Encounter: Payer: Self-pay | Admitting: Family Medicine

## 2015-04-05 DIAGNOSIS — I5030 Unspecified diastolic (congestive) heart failure: Secondary | ICD-10-CM | POA: Diagnosis not present

## 2015-04-05 DIAGNOSIS — R531 Weakness: Secondary | ICD-10-CM | POA: Diagnosis not present

## 2015-04-05 DIAGNOSIS — I130 Hypertensive heart and chronic kidney disease with heart failure and stage 1 through stage 4 chronic kidney disease, or unspecified chronic kidney disease: Secondary | ICD-10-CM | POA: Diagnosis not present

## 2015-04-05 DIAGNOSIS — N183 Chronic kidney disease, stage 3 (moderate): Secondary | ICD-10-CM | POA: Diagnosis not present

## 2015-04-05 DIAGNOSIS — R69 Illness, unspecified: Secondary | ICD-10-CM | POA: Diagnosis not present

## 2015-04-08 DIAGNOSIS — R531 Weakness: Secondary | ICD-10-CM | POA: Diagnosis not present

## 2015-04-08 DIAGNOSIS — N183 Chronic kidney disease, stage 3 (moderate): Secondary | ICD-10-CM | POA: Diagnosis not present

## 2015-04-08 DIAGNOSIS — I5030 Unspecified diastolic (congestive) heart failure: Secondary | ICD-10-CM | POA: Diagnosis not present

## 2015-04-08 DIAGNOSIS — I130 Hypertensive heart and chronic kidney disease with heart failure and stage 1 through stage 4 chronic kidney disease, or unspecified chronic kidney disease: Secondary | ICD-10-CM | POA: Diagnosis not present

## 2015-04-09 DIAGNOSIS — I130 Hypertensive heart and chronic kidney disease with heart failure and stage 1 through stage 4 chronic kidney disease, or unspecified chronic kidney disease: Secondary | ICD-10-CM | POA: Diagnosis not present

## 2015-04-09 DIAGNOSIS — I5030 Unspecified diastolic (congestive) heart failure: Secondary | ICD-10-CM | POA: Diagnosis not present

## 2015-04-09 DIAGNOSIS — R531 Weakness: Secondary | ICD-10-CM | POA: Diagnosis not present

## 2015-04-09 DIAGNOSIS — N183 Chronic kidney disease, stage 3 (moderate): Secondary | ICD-10-CM | POA: Diagnosis not present

## 2015-04-12 DIAGNOSIS — I130 Hypertensive heart and chronic kidney disease with heart failure and stage 1 through stage 4 chronic kidney disease, or unspecified chronic kidney disease: Secondary | ICD-10-CM | POA: Diagnosis not present

## 2015-04-12 DIAGNOSIS — N183 Chronic kidney disease, stage 3 (moderate): Secondary | ICD-10-CM | POA: Diagnosis not present

## 2015-04-12 DIAGNOSIS — R531 Weakness: Secondary | ICD-10-CM | POA: Diagnosis not present

## 2015-04-12 DIAGNOSIS — I5030 Unspecified diastolic (congestive) heart failure: Secondary | ICD-10-CM | POA: Diagnosis not present

## 2015-04-13 DIAGNOSIS — R531 Weakness: Secondary | ICD-10-CM | POA: Diagnosis not present

## 2015-04-13 DIAGNOSIS — I130 Hypertensive heart and chronic kidney disease with heart failure and stage 1 through stage 4 chronic kidney disease, or unspecified chronic kidney disease: Secondary | ICD-10-CM | POA: Diagnosis not present

## 2015-04-13 DIAGNOSIS — N183 Chronic kidney disease, stage 3 (moderate): Secondary | ICD-10-CM | POA: Diagnosis not present

## 2015-04-13 DIAGNOSIS — I5030 Unspecified diastolic (congestive) heart failure: Secondary | ICD-10-CM | POA: Diagnosis not present

## 2015-04-15 DIAGNOSIS — N183 Chronic kidney disease, stage 3 (moderate): Secondary | ICD-10-CM | POA: Diagnosis not present

## 2015-04-15 DIAGNOSIS — I130 Hypertensive heart and chronic kidney disease with heart failure and stage 1 through stage 4 chronic kidney disease, or unspecified chronic kidney disease: Secondary | ICD-10-CM | POA: Diagnosis not present

## 2015-04-15 DIAGNOSIS — I5030 Unspecified diastolic (congestive) heart failure: Secondary | ICD-10-CM | POA: Diagnosis not present

## 2015-04-15 DIAGNOSIS — R531 Weakness: Secondary | ICD-10-CM | POA: Diagnosis not present

## 2015-04-16 ENCOUNTER — Encounter: Payer: Self-pay | Admitting: Nurse Practitioner

## 2015-04-16 ENCOUNTER — Ambulatory Visit (INDEPENDENT_AMBULATORY_CARE_PROVIDER_SITE_OTHER): Payer: Medicare Other | Admitting: Nurse Practitioner

## 2015-04-16 VITALS — BP 140/50 | HR 72 | Ht 72.0 in | Wt 204.4 lb

## 2015-04-16 DIAGNOSIS — I951 Orthostatic hypotension: Secondary | ICD-10-CM

## 2015-04-16 DIAGNOSIS — I2510 Atherosclerotic heart disease of native coronary artery without angina pectoris: Secondary | ICD-10-CM | POA: Diagnosis not present

## 2015-04-16 DIAGNOSIS — I10 Essential (primary) hypertension: Secondary | ICD-10-CM | POA: Diagnosis not present

## 2015-04-16 DIAGNOSIS — I480 Paroxysmal atrial fibrillation: Secondary | ICD-10-CM | POA: Diagnosis not present

## 2015-04-16 NOTE — Patient Instructions (Addendum)
We will be checking the following labs today -  NONE   Medication Instructions:    Continue with your current medicines.     Testing/Procedures To Be Arranged:  N/A  Follow-Up:   See Dr. Cooper in 6 months    Other Special Instructions:   N/A    If you need a refill on your cardiac medications before your next appointment, please call your pharmacy.   Call the [facility] office at [phone] if you have any questions, problems or concerns.      

## 2015-04-16 NOTE — Progress Notes (Signed)
CARDIOLOGY OFFICE NOTE  Date:  04/16/2015    Jason [name] Date of Birth: 02/26/1920 Medical Record #[id number]  PCP:  Leo Grosser, MD  Cardiologist:  Excell Seltzer    Chief Complaint  Patient presents with  . Coronary Artery Disease  . PAD  . Hypertension  . Hyperlipidemia    6 month check - seen for Dr. Excell Seltzer    History of Present Illness: Jason [name] is a 82 y.o. male who presents today for a follow up visit. Seen for Dr. Excell Seltzer.   He has known PVD with prior CEA, PAF, recurrent DVT's s/p IVC filter placement, and stable CAD without angina. He is chronically anticoagulated with Xarelto. He's had problems with orthostatic hypotension when taking antihypertensive medications. He has had a chronic bradycardia as well as long standing dizziness.  Other issues as noted below.    Last seen in August for his pre op clearance for CEA. BP was up. Norvasc was increased.   Admitted last back in December with generalized weakness, sinus pressure and dizziness. Found to have bradycardia and AKI. He had intermittent HRs in the high 40's - this was apparently not new but Bisoprolol was cut back. He remained in NSR and on renally dosed Xarelto. Felt to have had some degree of dehydration as well.   Comes back today. Here with his daughter. Using a walker. Resides at a facility - but no paperwork sent. Daughter is happy that January was the first month in several that he was not admitted. His carotid surgery complicated by too much fluid, then too dry. Had 2 strokes. Then this weakness from December. Doing better. Still with lots of dizziness but this is chronic. Denies much chest pain. Not short of breath. He does use oxygen - especially at night. Weight is up. Appetite has been ok. No recent falls but he is unsteady. Daughter is happy with how he is doing.   Past Medical History  Diagnosis Date  . PVD (peripheral vascular disease) (HCC)   . CAD (coronary artery disease)     a. 12/2005 -  PCI to OM  . Hyperlipidemia   . Cerebrovascular accident (HCC)   . 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 GERD (gastroesophageal reflux disease)   . Edema   . Vertigo   . IBS (irritable bowel syndrome)   . Duodenitis   . Gastritis   . Esophageal stricture   . Cellulitis   . Pulmonary embolism (HCC)   . Prostatic hypertrophy   . Frequent falls   . Subdural hematoma (HCC)   . PTSD (post-traumatic stress disorder)   . Vertigo   . TIA (transient ischemic attack)     a. 01/2006 and 05/2006.   [name] Hiatal hernia     periferal neuropathy  . Hx of echocardiogram     Echo 9/13:  Mild LVH, EF 60-65%, Gr 1 diast dysfn, mild AI, mild BAE  . Gout   . Chronic low back pain   . Gait disorder   . Peripheral neuropathy (HCC)   . Hydrocele   . History of shingles     Left lumbar  . HOH (hard of hearing)     hearing aid, totally in R ear ( no hearing aid in L )   . Myocardial infarction [facility]) "years ago"    "dr said I'd had a small one"  . Daily headache   . Depression   .  Urinary frequency   . Blind left eye   . Hypertension   . Dysrhythmia   . Arthritis   . Anxiety     PTSD  . Hemiparesis and alteration of sensations as late effects of stroke (HCC) 03/04/2015    Past Surgical History  Procedure Laterality Date  . Hand surgery Right     "replaced bone in my finger"  . Angioplasty    . Carotid endarterectomy Left   . Coronary angioplasty with stent placement      left circumflex  . Cataract extraction, bilateral    . Tonsillectomy    . Tympanic membrane repair Right 1978    [name] 07/27/2010; "couldn'getting so t couldn't hear; had noise in my ear; didn't correct either  . Hand contracture release Right     palm/notes 07/27/2010  . Esophagogastroduodenoscopy (egd) with esophageal dilation    . Insertion of vena cava filter  2013    h/o PE  . Endarterectomy Left 11/02/2014    Procedure: LEFT  CAROTID ARTERY ENDARTERECTOMY   WITH DACRON PATCH ANGIOPLASTY;  Surgeon: Sherren Kerns, MD;  Location: [facility] OR;  Service: Vascular;  Laterality: Left;     Medications: Current Outpatient Prescriptions  Medication Sig Dispense Refill  . acetaminophen (TYLENOL) 500 MG tablet Take 1,000 mg by mouth 2 (two) times daily. Take every day per [facility]    . ALPRAZolam (XANAX) 0.25 MG tablet Take 1 tablet (0.25 mg total) by mouth 2 (two) times daily as needed for anxiety. 60 tablet 3  . alum & mag hydroxide-simeth (MAALOX/MYLANTA) 200-200-20 MG/5ML suspension Take 30 mLs by mouth every 6 (six) hours as needed for indigestion or heartburn.    [name] aspirin EC 81 MG EC tablet Take 1 tablet (81 mg total) by mouth daily.    [name] DEXILANT 60 MG capsule TAKE ONE CAPSULE BY MOUTH EVERY DAY 30 capsule 11  . docusate sodium (COLACE) 100 MG capsule Take 1 capsule (100 mg total) by mouth at bedtime. 30 capsule 11  . doxazosin (CARDURA) 4 MG tablet Take 1 tablet (4 mg total) by mouth daily. 30 tablet 3  . DULoxetine (CYMBALTA) 30 MG capsule TAKE ONE CAPSULE BY MOUTH EVERY DAY 30 capsule 6  . famotidine (PEPCID) 20 MG tablet Take 1 tablet (20 mg total) by mouth 2 (two) times daily. 60 tablet 0  . finasteride (PROSCAR) 5 MG tablet Take 1 tablet (5 mg total) by mouth daily. 30 tablet 0  . furosemide (LASIX) 40 MG tablet Take 1 tablet (40 mg total) by mouth every other day. 30 tablet 0  . hydrALAZINE (APRESOLINE) 50 MG tablet Take 1 tablet (50 mg total) by mouth 3 (three) times daily. 90 tablet 0  . HYDROcodone-acetaminophen (NORCO/VICODIN) 5-325 MG tablet Take 1 tablet by mouth every 6 (six) hours as needed for moderate pain. 60 tablet 0  . latanoprost (XALATAN) 0.005 % ophthalmic solution Place 1 drop into both eyes at bedtime.    [name] loperamide (IMODIUM) 2 MG capsule Take 2 mg by mouth as needed for diarrhea or loose stools (Not to exceed 8 doses in 24 hours).    . magnesium hydroxide (MILK OF MAGNESIA) 400 MG/5ML suspension Take 30 mLs by mouth at bedtime as  needed for mild constipation.    . meclizine (ANTIVERT) 12.5 MG tablet Take 12.5 mg by mouth every 12 (twelve) hours as needed for dizziness.    . mupirocin ointment (BACTROBAN) 2 % Place into the nose 2 (two) times daily. 22 g  0  . MYRBETRIQ 25 MG TB24 tablet TAKE 1 TABLET BY MOUTH EVERY DAY 30 tablet 11  . nebivolol (BYSTOLIC) 2.5 MG tablet Take 1 tablet (2.5 mg total) by mouth daily. 30 tablet 0  . nitroGLYCERIN (NITROSTAT) 0.4 MG SL tablet Place 0.4 mg under the tongue every 5 (five) minutes as needed. Chest pain    . NON FORMULARY Place 2 L into the nose at bedtime.    . ondansetron (ZOFRAN) 4 MG tablet Take 4 mg by mouth every 8 (eight) hours as needed for nausea.    . polyethylene glycol powder (GLYCOLAX/MIRALAX) powder MIX 17 GRAMS WITH 4 TO 6 OUNCES OF WATER AND DRINK ONCE DAILY AS NEEDED FOR CONSTIPATION 527 g 2  . polyvinyl alcohol (LIQUIFILM TEARS) 1.4 % ophthalmic solution Place 2 drops into both eyes 3 (three) times daily.    . predniSONE (DELTASONE) 10 MG tablet Take 1 tablet (10 mg total) by mouth daily with breakfast. 30 tablet 5  . senna-docusate (SENOKOT-S) 8.6-50 MG tablet Take 1 tablet by mouth at bedtime as needed for mild constipation. 30 tablet 0  . simvastatin (ZOCOR) 40 MG tablet Take 1 tablet (40 mg total) by mouth daily at 6 PM.    . sodium chloride (OCEAN) 0.65 % SOLN nasal spray Place 1 spray into both nostrils 2 (two) times daily. For 1 week 1 Bottle 0  . XARELTO 15 MG TABS tablet TAKE 1 TABLET BY MOUTH EVERY DAY WITH SUPPER 30 tablet 6   No current facility-administered medications for this visit.    Allergies: Allergies  Allergen Reactions  . Ativan [Lorazepam] Other (See Comments)    unknown  . Augmentin [Amoxicillin-Pot Clavulanate] Nausea And Vomiting    daughter states he can tolerate Amoxicillin ok Profuse vomiting. Unable to answer penicillin questions as pt is from Nursing home and unable to provide answers to questions  . Belladonna Alkaloids      Unknown- on MAR  . Corticosteroids     Unknown- on MAR  . Fludrocortisone Acetate Other (See Comments)    "leg swelling"  . Parabens     Unknown- on MAR  . Scopace [Scopolamine] Other (See Comments)    *patch "Made him crazy" per patients daughter     Social History: The patient  reports that he has never smoked. He has never used smokeless tobacco. He reports that he drinks alcohol. He reports that he does not use illicit drugs.   Family History: The patient's family history includes Breast cancer in his sister; Coronary artery disease in his brother; Dementia in his sister; Heart attack in his father; Migraines in his mother; Renal Disease in his brother. There is no history of Colon cancer.   Review of Systems: Please see the history of present illness.   Otherwise, the review of systems is positive for none.   All other systems are reviewed and negative.   Physical Exam: VS:  BP 140/50 mmHg  Pulse 72  Ht 6' (1.829 m)  Wt 204 lb 6.4 oz (92.715 kg)  BMI 27.72 kg/m2 .  BMI Body mass index is 27.72 kg/(m^2).  Wt Readings from Last 3 Encounters:  04/16/15 204 lb 6.4 oz (92.715 kg)  03/04/15 195 lb (88.451 kg)  02/12/15 198 lb (89.812 kg)    General: Pleasant. Elderly male. HOH but alert and in no acute distress. He looks younger than his stated age.  HEENT: Normal. Neck: Supple, no JVD, carotid bruits, or masses noted.  Cardiac: Regular rate and  rhythm. No murmurs, rubs, or gallops. Trace edema.  Respiratory:  Lungs are clear to auscultation bilaterally with normal work of breathing.  GI: Soft and nontender.  MS: No deformity or atrophy. Gait and ROM intact. He is using a walker.  Skin: Warm and dry. Color is normal.  Neuro:  Strength and sensation are intact and no gross focal deficits noted.  Psych: Alert, appropriate and with normal affect.   LABORATORY DATA:  EKG:  EKG is not ordered today.  Lab Results  Component Value Date   WBC 9.5 02/12/2015   HGB 13.6  02/12/2015   HCT 41.3 02/12/2015   PLT 187 02/12/2015   GLUCOSE 102* 03/01/2015   CHOL 233* 12/22/2014   TRIG 146 12/22/2014   HDL 45 12/22/2014   LDLDIRECT 155.4 01/18/2007   LDLCALC 159* 12/22/2014   ALT 13* 02/12/2015   AST 18 02/12/2015   NA 139 03/01/2015   K 4.8 03/01/2015   CL 104 03/01/2015   CREATININE 1.78* 03/01/2015   BUN 31* 03/01/2015   CO2 26 03/01/2015   TSH 1.574 11/16/2014   PSA 1.00 02/14/2015   INR 1.65* 12/21/2014   HGBA1C 5.9* 12/22/2014    BNP (last 3 results)  Recent Labs  11/16/14 1942  BNP 598.4*    ProBNP (last 3 results) No results for input(s): PROBNP in the last 8760 hours.   Other Studies Reviewed Today:  Echo Study Conclusions from 11/2014  - Left ventricle: The cavity size was normal. There was mild focal basal hypertrophy of the septum. Systolic function was normal. The estimated ejection fraction was in the range of 50% to 55%. Wall motion was normal; there were no regional wall motion abnormalities. Doppler parameters are consistent with abnormal left ventricular relaxation (grade 1 diastolic dysfunction). - Aortic valve: Trileaflet; moderately thickened, moderately calcified leaflets. There was trivial regurgitation. - Left atrium: The atrium was moderately dilated. - Pulmonic valve: There was moderate regurgitation. - Pulmonary arteries: Systolic pressure was mildly increased. PA peak pressure: 38 mm Hg (S). - Pericardium, extracardiac: A trivial pericardial effusion was identified posterior to the heart.  Impressions:  - No cardiac source of emboli was indentified. Compared to the prior study, there has been no significant interval change.  Assessment/Plan: 1. CAD - no active symptoms reported. Would favor continued medical management.   2. PAF - in sinus on exam today.   3. Chronic anticoagulation - he is unsteady but no recent falls.   4. Chronic bradycardia - HR improved today.   5.  PVD  6. HTN - BP ok on current regimen.   7. Orthostasis - chronic.   8. Advanced age - seems to be holding his own but long term prognosis tenuous. Labs will be checked by PCP later this month.   Current medicines are reviewed with the patient today.  The patient does not have concerns regarding medicines other than what has been noted above.  The following changes have been made:  See above.  Labs/ tests ordered today include:   No orders of the defined types were placed in this encounter.     Disposition:   FU with Dr. Excell Seltzer in 6 months.   Patient is agreeable to this plan and will call if any problems develop in the interim.   Signed: Rosalio Macadamia, RN, ANP-C 04/16/2015 3:42 PM  [facility] [address] [place], [place]  [address] Phone: [phone] Fax: [phone]

## 2015-04-17 DIAGNOSIS — I10 Essential (primary) hypertension: Secondary | ICD-10-CM | POA: Diagnosis not present

## 2015-04-17 DIAGNOSIS — I2510 Atherosclerotic heart disease of native coronary artery without angina pectoris: Secondary | ICD-10-CM | POA: Diagnosis not present

## 2015-04-17 DIAGNOSIS — I252 Old myocardial infarction: Secondary | ICD-10-CM | POA: Diagnosis not present

## 2015-04-29 ENCOUNTER — Other Ambulatory Visit: Payer: Medicare Other

## 2015-04-29 DIAGNOSIS — R944 Abnormal results of kidney function studies: Secondary | ICD-10-CM | POA: Diagnosis not present

## 2015-04-30 LAB — BASIC METABOLIC PANEL
BUN: 35 mg/dL — ABNORMAL HIGH (ref 7–25)
CALCIUM: 8.4 mg/dL — AB (ref 8.6–10.3)
CHLORIDE: 107 mmol/L (ref 98–110)
CO2: 22 mmol/L (ref 20–31)
CREATININE: 1.73 mg/dL — AB (ref 0.70–1.11)
GLUCOSE: 98 mg/dL (ref 70–99)
Potassium: 4.5 mmol/L (ref 3.5–5.3)
Sodium: 137 mmol/L (ref 135–146)

## 2015-05-02 ENCOUNTER — Emergency Department (HOSPITAL_COMMUNITY)
Admission: EM | Admit: 2015-05-02 | Discharge: 2015-05-02 | Disposition: A | Discharge status: Home / Self Care | Payer: Medicare Other | Attending: Emergency Medicine | Admitting: Emergency Medicine

## 2015-05-02 ENCOUNTER — Emergency Department (HOSPITAL_COMMUNITY): Payer: Medicare Other

## 2015-05-02 ENCOUNTER — Encounter (HOSPITAL_COMMUNITY): Payer: Self-pay

## 2015-05-02 DIAGNOSIS — I252 Old myocardial infarction: Secondary | ICD-10-CM | POA: Insufficient documentation

## 2015-05-02 DIAGNOSIS — R05 Cough: Secondary | ICD-10-CM | POA: Diagnosis not present

## 2015-05-02 DIAGNOSIS — F419 Anxiety disorder, unspecified: Secondary | ICD-10-CM | POA: Diagnosis not present

## 2015-05-02 DIAGNOSIS — I2510 Atherosclerotic heart disease of native coronary artery without angina pectoris: Secondary | ICD-10-CM | POA: Insufficient documentation

## 2015-05-02 DIAGNOSIS — Z86718 Personal history of other venous thrombosis and embolism: Secondary | ICD-10-CM | POA: Diagnosis not present

## 2015-05-02 DIAGNOSIS — K219 Gastro-esophageal reflux disease without esophagitis: Secondary | ICD-10-CM | POA: Insufficient documentation

## 2015-05-02 DIAGNOSIS — R0602 Shortness of breath: Secondary | ICD-10-CM | POA: Diagnosis not present

## 2015-05-02 DIAGNOSIS — I10 Essential (primary) hypertension: Secondary | ICD-10-CM | POA: Insufficient documentation

## 2015-05-02 DIAGNOSIS — M109 Gout, unspecified: Secondary | ICD-10-CM | POA: Insufficient documentation

## 2015-05-02 DIAGNOSIS — Z8673 Personal history of transient ischemic attack (TIA), and cerebral infarction without residual deficits: Secondary | ICD-10-CM | POA: Insufficient documentation

## 2015-05-02 DIAGNOSIS — H9190 Unspecified hearing loss, unspecified ear: Secondary | ICD-10-CM | POA: Insufficient documentation

## 2015-05-02 DIAGNOSIS — Z86711 Personal history of pulmonary embolism: Secondary | ICD-10-CM | POA: Insufficient documentation

## 2015-05-02 DIAGNOSIS — Z8619 Personal history of other infectious and parasitic diseases: Secondary | ICD-10-CM | POA: Insufficient documentation

## 2015-05-02 DIAGNOSIS — Z79899 Other long term (current) drug therapy: Secondary | ICD-10-CM | POA: Insufficient documentation

## 2015-05-02 DIAGNOSIS — G629 Polyneuropathy, unspecified: Secondary | ICD-10-CM | POA: Diagnosis not present

## 2015-05-02 DIAGNOSIS — Z9181 History of falling: Secondary | ICD-10-CM | POA: Insufficient documentation

## 2015-05-02 DIAGNOSIS — Z872 Personal history of diseases of the skin and subcutaneous tissue: Secondary | ICD-10-CM | POA: Insufficient documentation

## 2015-05-02 DIAGNOSIS — Z7982 Long term (current) use of aspirin: Secondary | ICD-10-CM | POA: Insufficient documentation

## 2015-05-02 DIAGNOSIS — J069 Acute upper respiratory infection, unspecified: Secondary | ICD-10-CM | POA: Diagnosis not present

## 2015-05-02 DIAGNOSIS — F329 Major depressive disorder, single episode, unspecified: Secondary | ICD-10-CM | POA: Diagnosis not present

## 2015-05-02 DIAGNOSIS — G8929 Other chronic pain: Secondary | ICD-10-CM | POA: Insufficient documentation

## 2015-05-02 DIAGNOSIS — R531 Weakness: Secondary | ICD-10-CM | POA: Diagnosis not present

## 2015-05-02 DIAGNOSIS — E785 Hyperlipidemia, unspecified: Secondary | ICD-10-CM | POA: Insufficient documentation

## 2015-05-02 DIAGNOSIS — R404 Transient alteration of awareness: Secondary | ICD-10-CM | POA: Diagnosis not present

## 2015-05-02 DIAGNOSIS — Z87438 Personal history of other diseases of male genital organs: Secondary | ICD-10-CM | POA: Diagnosis not present

## 2015-05-02 MED ORDER — GUAIFENESIN ER 600 MG PO TB12
1200.0000 mg | ORAL_TABLET | Freq: Two times a day (BID) | ORAL | Status: DC
  Administered 2015-05-02: 1200 mg via ORAL
  Filled 2015-05-02: qty 2

## 2015-05-02 MED ORDER — BENZONATATE 100 MG PO CAPS: 100.0000 mg | ORAL_CAPSULE | Freq: Three times a day (TID) | ORAL | Status: DC

## 2015-05-02 MED ORDER — IPRATROPIUM BROMIDE 0.02 % IN SOLN
0.5000 mg | Freq: Once | RESPIRATORY_TRACT | Status: AC
  Administered 2015-05-02: 0.5 mg via RESPIRATORY_TRACT
  Filled 2015-05-02: qty 2.5

## 2015-05-02 MED ORDER — ALBUTEROL SULFATE HFA 108 (90 BASE) MCG/ACT IN AERS: 1.0000 | INHALATION_SPRAY | Freq: Four times a day (QID) | RESPIRATORY_TRACT | Status: DC | PRN

## 2015-05-02 MED ORDER — GUAIFENESIN ER 1200 MG PO TB12: 1.0000 | ORAL_TABLET | Freq: Two times a day (BID) | ORAL | Status: DC

## 2015-05-02 MED ORDER — IPRATROPIUM BROMIDE 0.06 % NA SOLN: 2.0000 | Freq: Four times a day (QID) | NASAL | Status: AC

## 2015-05-02 MED ORDER — GUAIFENESIN 200 MG PO TABS: 200.0000 mg | ORAL_TABLET | ORAL | Status: DC | PRN

## 2015-05-02 MED ORDER — IPRATROPIUM BROMIDE 0.03 % NA SOLN: 2.0000 | Freq: Two times a day (BID) | NASAL | Status: DC

## 2015-05-02 MED ORDER — ALBUTEROL SULFATE (2.5 MG/3ML) 0.083% IN NEBU
2.5000 mg | INHALATION_SOLUTION | Freq: Once | RESPIRATORY_TRACT | Status: AC
  Administered 2015-05-02: 2.5 mg via RESPIRATORY_TRACT
  Filled 2015-05-02: qty 3

## 2015-05-02 NOTE — ED Notes (Signed)
Awake. Verbally responsive. A/O x4. Resp even and unlabored. No audible adventitious breath sounds noted. ABC's intact.  

## 2015-05-02 NOTE — Discharge Instructions (Signed)
Cool Mist Vaporizers  Vaporizers may help relieve the symptoms of a cough and cold. They add moisture to the air, which helps mucus to become thinner and less sticky. This makes it easier to breathe and cough up secretions. Cool mist vaporizers do not cause serious burns like hot mist vaporizers, which may also be called steamers or humidifiers. Vaporizers have not been proven to help with colds. You should not use a vaporizer if you are allergic to mold.  HOME CARE INSTRUCTIONS  · Follow the package instructions for the vaporizer.  · Do not use anything other than distilled water in the vaporizer.  · Do not run the vaporizer all of the time. This can cause mold or bacteria to grow in the vaporizer.  · Clean the vaporizer after each time it is used.  · Clean and dry the vaporizer well before storing it.  · Stop using the vaporizer if worsening respiratory symptoms develop.     This information is not intended to replace advice given to you by your health care provider. Make sure you discuss any questions you have with your health care provider.     Document Released: 11/25/2003 Document Revised: 03/04/2013 Document Reviewed: 07/17/2012  Elsevier Interactive Patient Education ©2016 [employer]

## 2015-05-02 NOTE — ED Notes (Signed)
Per EMS patient from [facility] c/o cough and SOB x2 days, per EMS no fever, BP 170/90, HR 70, RR 18.  Patient 89 on RA, EMS applied 2L for comfort, patient now at 98%.

## 2015-05-02 NOTE — ED Provider Notes (Signed)
CSN: [id number]     Arrival date & time 05/02/15  0435 History   First MD Initiated Contact with Patient 05/02/15 0501     Chief Complaint  Patient presents with  . Cough     (Consider location/radiation/quality/duration/timing/severity/associated sxs/prior Treatment) Patient is a 82 y.o. male presenting with cough. The history is provided by the patient.  Cough Cough characteristics:  Non-productive Severity:  Moderate Onset quality:  Gradual Duration:  2 days Timing:  Intermittent Progression:  Unchanged Chronicity:  New Smoker: no   Relieved by:  Nothing Worsened by:  Nothing tried Ineffective treatments:  None tried Associated symptoms: no chest pain, no fever and no shortness of breath   Risk factors: no chemical exposure     Past Medical History  Diagnosis Date  . PVD (peripheral vascular disease) (HCC)   . CAD (coronary artery disease)     a. 12/2005 - PCI to OM  . Hyperlipidemia   . Cerebrovascular accident (HCC)   . 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 GERD (gastroesophageal reflux disease)   . Edema   . Vertigo   . IBS (irritable bowel syndrome)   . Duodenitis   . Gastritis   . Esophageal stricture   . Cellulitis   . Pulmonary embolism (HCC)   . Prostatic hypertrophy   . Frequent falls   . Subdural hematoma (HCC)   . PTSD (post-traumatic stress disorder)   . Vertigo   . TIA (transient ischemic attack)     a. 01/2006 and 05/2006.   [name] Hiatal hernia     periferal neuropathy  . Hx of echocardiogram     Echo 9/13:  Mild LVH, EF 60-65%, Gr 1 diast dysfn, mild AI, mild BAE  . Gout   . Chronic low back pain   . Gait disorder   . Peripheral neuropathy (HCC)   . Hydrocele   . History of shingles     Left lumbar  . HOH (hard of hearing)     hearing aid, totally in R ear ( no hearing aid in L )   . Myocardial infarction [facility]) "years ago"    "dr said I'd had a small one"  . Daily headache   .  Depression   . Urinary frequency   . Blind left eye   . Hypertension   . Dysrhythmia   . Arthritis   . Anxiety     PTSD  . Hemiparesis and alteration of sensations as late effects of stroke (HCC) 03/04/2015   Past Surgical History  Procedure Laterality Date  . Hand surgery Right     "replaced bone in my finger"  . Angioplasty    . Carotid endarterectomy Left   . Coronary angioplasty with stent placement      left circumflex  . Cataract extraction, bilateral    . Tonsillectomy    . Tympanic membrane repair Right 1978    [name] 07/27/2010; "couldn'getting so t couldn't hear; had noise in my ear; didn't correct either  . Hand contracture release Right     palm/notes 07/27/2010  . Esophagogastroduodenoscopy (egd) with esophageal dilation    . Insertion of vena cava filter  2013    h/o PE  . Endarterectomy Left 11/02/2014    Procedure: LEFT  CAROTID ARTERY ENDARTERECTOMY  WITH DACRON PATCH ANGIOPLASTY;  Surgeon: Sherren Kerns, MD;  Location: [facility] OR;  Service: Vascular;  Laterality: Left;   Family History  Problem Relation  Age of Onset  . Colon cancer Neg Hx   . Coronary artery disease Brother   . Heart attack Father   . Migraines Mother   . Breast cancer Sister   . Dementia Sister   . Renal Disease Brother    Social History  Substance Use Topics  . Smoking status: Never Smoker   . Smokeless tobacco: Never Used  . Alcohol Use: 0.0 oz/week    0 Standard drinks or equivalent per week     Comment: wine occasionally    Review of Systems  Constitutional: Negative for fever.  Respiratory: Positive for cough. Negative for shortness of breath.   Cardiovascular: Negative for chest pain.  All other systems reviewed and are negative.     Allergies  Ativan; Augmentin; Belladonna alkaloids; Corticosteroids; Fludrocortisone acetate; Parabens; and Scopace  Home Medications   Prior to Admission medications   Medication Sig Start Date End Date Taking? Authorizing Provider   acetaminophen (TYLENOL) 500 MG tablet Take 1,000 mg by mouth 2 (two) times daily. Take every day per [facility]   Yes Historical Provider, MD  ALPRAZolam (XANAX) 0.25 MG tablet Take 1 tablet (0.25 mg total) by mouth 2 (two) times daily as needed for anxiety. 03/04/15  Yes Donita Brooks, MD  alum & mag hydroxide-simeth (MAALOX/MYLANTA) 200-200-20 MG/5ML suspension Take 30 mLs by mouth every 6 (six) hours as needed for indigestion or heartburn.   Yes Historical Provider, MD  aspirin EC 81 MG EC tablet Take 1 tablet (81 mg total) by mouth daily. 12/23/14  Yes Shanker Levora Dredge, MD  DEXILANT 60 MG capsule TAKE ONE CAPSULE BY MOUTH EVERY DAY 03/16/14  Yes Donita Brooks, MD  dextromethorphan-guaiFENesin [facility] COLD COUGH+ CHEST) 10-100 MG/5ML liquid Take 10 mLs by mouth every 4 (four) hours as needed for cough.   Yes Historical Provider, MD  docusate sodium (COLACE) 100 MG capsule Take 1 capsule (100 mg total) by mouth at bedtime. 07/14/14  Yes Donita Brooks, MD  doxazosin (CARDURA) 4 MG tablet Take 1 tablet (4 mg total) by mouth daily. 03/25/15  Yes Donita Brooks, MD  DULoxetine (CYMBALTA) 30 MG capsule TAKE ONE CAPSULE BY MOUTH EVERY DAY 09/07/14  Yes York Spaniel, MD  famotidine (PEPCID) 20 MG tablet Take 1 tablet (20 mg total) by mouth 2 (two) times daily. 02/14/15  Yes Albertine Grates, MD  finasteride (PROSCAR) 5 MG tablet Take 1 tablet (5 mg total) by mouth daily. 02/14/15  Yes Albertine Grates, MD  furosemide (LASIX) 40 MG tablet Take 1 tablet (40 mg total) by mouth every other day. 02/14/15  Yes Albertine Grates, MD  hydrALAZINE (APRESOLINE) 50 MG tablet Take 1 tablet (50 mg total) by mouth 3 (three) times daily. 02/14/15  Yes Albertine Grates, MD  HYDROcodone-acetaminophen (NORCO/VICODIN) 5-325 MG tablet Take 1 tablet by mouth every 6 (six) hours as needed for moderate pain. 03/04/15  Yes Donita Brooks, MD  latanoprost (XALATAN) 0.005 % ophthalmic solution Place 1 drop into both eyes at bedtime.   Yes Historical Provider, MD   loperamide (IMODIUM) 2 MG capsule Take 2 mg by mouth as needed for diarrhea or loose stools (Not to exceed 8 doses in 24 hours).   Yes Historical Provider, MD  magnesium hydroxide (MILK OF MAGNESIA) 400 MG/5ML suspension Take 30 mLs by mouth at bedtime as needed for mild constipation.   Yes Historical Provider, MD  meclizine (ANTIVERT) 12.5 MG tablet Take 12.5 mg by mouth every 12 (twelve) hours as needed  for dizziness.   Yes Historical Provider, MD  MYRBETRIQ 25 MG TB24 tablet TAKE 1 TABLET BY MOUTH EVERY DAY 07/13/14  Yes Donita Brooks, MD  nebivolol (BYSTOLIC) 2.5 MG tablet Take 1 tablet (2.5 mg total) by mouth daily. 02/14/15  Yes Albertine Grates, MD  ondansetron (ZOFRAN) 4 MG tablet Take 4 mg by mouth every 8 (eight) hours as needed for nausea.   Yes Historical Provider, MD  polyethylene glycol powder (GLYCOLAX/MIRALAX) powder MIX 17 GRAMS WITH 4 TO 6 OUNCES OF WATER AND DRINK ONCE DAILY AS NEEDED FOR CONSTIPATION 03/07/13  Yes Donita Brooks, MD  polyvinyl alcohol (LIQUIFILM TEARS) 1.4 % ophthalmic solution Place 2 drops into both eyes 3 (three) times daily.   Yes Historical Provider, MD  predniSONE (DELTASONE) 10 MG tablet Take 1 tablet (10 mg total) by mouth daily with breakfast. 09/22/14  Yes Donita Brooks, MD  senna-docusate (SENOKOT-S) 8.6-50 MG tablet Take 1 tablet by mouth at bedtime as needed for mild constipation. 02/14/15  Yes Albertine Grates, MD  simvastatin (ZOCOR) 40 MG tablet Take 1 tablet (40 mg total) by mouth daily at 6 PM. 12/23/14  Yes Shanker Levora Dredge, MD  sodium chloride (OCEAN) 0.65 % SOLN nasal spray Place 1 spray into both nostrils 2 (two) times daily. For 1 week 02/09/15  Yes Salley Scarlet, MD  XARELTO 15 MG TABS tablet TAKE 1 TABLET BY MOUTH EVERY DAY WITH SUPPER 10/06/14  Yes Tonny Bollman, MD  mupirocin ointment (BACTROBAN) 2 % Place into the nose 2 (two) times daily. Patient not taking: Reported on 05/02/2015 02/14/15   Albertine Grates, MD  nitroGLYCERIN (NITROSTAT) 0.4 MG SL tablet  Place 0.4 mg under the tongue every 5 (five) minutes as needed. Chest pain    Historical Provider, MD  NON FORMULARY Place 2 L into the nose at bedtime.    Historical Provider, MD   BP 185/74 mmHg  Pulse 81  Temp(Src) 98.3 F (36.8 C) (Oral)  Resp 18  Ht 6' (1.829 m)  SpO2 100% Physical Exam  Constitutional: He appears well-developed and well-nourished. No distress.  HENT:  Head: Normocephalic and atraumatic.  Mouth/Throat: Oropharynx is clear and moist.  Eyes: Conjunctivae are normal. Pupils are equal, round, and reactive to light.  Neck: Normal range of motion. Neck supple.  Cardiovascular: Normal rate, regular rhythm and intact distal pulses.   Pulmonary/Chest: Effort normal and breath sounds normal. No respiratory distress. He has no wheezes. He has no rales.  Abdominal: Soft. Bowel sounds are normal. There is no tenderness. There is no rebound and no guarding.  Musculoskeletal: Normal range of motion.  Neurological: He is alert. He has normal reflexes.  Skin: Skin is warm and dry.  Psychiatric: He has a normal mood and affect.    ED Course  Procedures (including critical care time) Labs Review Labs Reviewed - No data to display  Imaging Review Dg Chest 2 View  05/02/2015  CLINICAL DATA:  Dry cough and shortness of breath for 1 day. History of hypertension and coronary artery disease. EXAM: CHEST  2 VIEW COMPARISON:  02/13/2015 FINDINGS: The heart size and mediastinal contours are within normal limits. Both lungs are clear. The visualized skeletal structures are unremarkable. IMPRESSION: No active cardiopulmonary disease. Electronically Signed   By: Burman Nieves M.D.   On: 05/02/2015 06:20   I have personally reviewed and evaluated these images and lab results as part of my medical decision-making.   EKG Interpretation None  MDM   Final diagnoses:  None   No PNA, no CHF.  Well appearing.    Will treat symptomatically.   Follow up with your PMD Monday for  recheck    Kynadie Yaun, MD 05/02/15 [phone]

## 2015-05-02 NOTE — ED Notes (Addendum)
EMS arrived to transport to facility. Called report to [employer] at [facility]. Verbalized understanding.

## 2015-05-02 NOTE — ED Notes (Signed)
Bed: WA04 Expected date:  Expected time:  Means of arrival:  Comments: EMS 82yo M cough

## 2015-05-03 ENCOUNTER — Encounter: Payer: Self-pay | Admitting: Family Medicine

## 2015-05-03 ENCOUNTER — Ambulatory Visit (INDEPENDENT_AMBULATORY_CARE_PROVIDER_SITE_OTHER): Payer: Medicare Other | Admitting: Family Medicine

## 2015-05-03 VITALS — BP 130/78 | HR 60 | Temp 97.9°F | Resp 16 | Ht 70.0 in

## 2015-05-03 DIAGNOSIS — R0600 Dyspnea, unspecified: Secondary | ICD-10-CM

## 2015-05-03 DIAGNOSIS — R635 Abnormal weight gain: Secondary | ICD-10-CM

## 2015-05-03 DIAGNOSIS — R140 Abdominal distension (gaseous): Secondary | ICD-10-CM

## 2015-05-04 ENCOUNTER — Ambulatory Visit
Admission: RE | Admit: 2015-05-04 | Discharge: 2015-05-04 | Disposition: A | Discharge status: Home / Self Care | Payer: Medicare Other | Source: Ambulatory Visit | Attending: Family Medicine | Admitting: Family Medicine

## 2015-05-04 ENCOUNTER — Encounter: Payer: Self-pay | Admitting: Family Medicine

## 2015-05-04 DIAGNOSIS — R140 Abdominal distension (gaseous): Secondary | ICD-10-CM

## 2015-05-04 DIAGNOSIS — R109 Unspecified abdominal pain: Secondary | ICD-10-CM | POA: Diagnosis not present

## 2015-05-04 NOTE — Progress Notes (Signed)
Subjective:    Patient ID: Jason [name], male    DOB: September 26, 1919, 82 y.o.   MRN: [id number]  HPI  Reports weight gain and shortness of breath. He is unable to stand to be weighed today as he nearly falls off our scale. However recently at the cardiologist office, his weight was 204 lbs which is up significantly from last time he was at his neurologist office. Wt Readings from Last 3 Encounters:  04/16/15 204 lb 6.4 oz (92.715 kg)  03/04/15 195 lb (88.451 kg)  02/12/15 198 lb (89.812 kg)   in addition to the weight gain he reports increasing weakness and shortness of breath. His daughter is concerned that he may be retaining fluid. Over the weekend they went to the hospital where a chest x-ray showed no evidence of pulmonary edema or fluid overload on exam. Today on my exam he has no pitting edema in his legs. He has no JVD. There are no pulmonary crackles.  However he has noticeable abdominal distention is significantly bloated. He denies having a bowel movement recently. He denies any flatus. He denies any nausea or projectile vomiting or symptoms of a bowel obstruction  Past Medical History  Diagnosis Date  . PVD (peripheral vascular disease) (HCC)   . CAD (coronary artery disease)     a. 12/2005 - PCI to OM  . Hyperlipidemia   . Cerebrovascular accident (HCC)   . 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 GERD (gastroesophageal reflux disease)   . Edema   . Vertigo   . IBS (irritable bowel syndrome)   . Duodenitis   . Gastritis   . Esophageal stricture   . Cellulitis   . Pulmonary embolism (HCC)   . Prostatic hypertrophy   . Frequent falls   . Subdural hematoma (HCC)   . PTSD (post-traumatic stress disorder)   . Vertigo   . TIA (transient ischemic attack)     a. 01/2006 and 05/2006.   [name] Hiatal hernia     periferal neuropathy  . Hx of echocardiogram     Echo 9/13:  Mild LVH, EF 60-65%, Gr 1 diast dysfn, mild AI, mild BAE    . Gout   . Chronic low back pain   . Gait disorder   . Peripheral neuropathy (HCC)   . Hydrocele   . History of shingles     Left lumbar  . HOH (hard of hearing)     hearing aid, totally in R ear ( no hearing aid in L )   . Myocardial infarction [facility]) "years ago"    "dr said I'd had a small one"  . Daily headache   . Depression   . Urinary frequency   . Blind left eye   . Hypertension   . Dysrhythmia   . Arthritis   . Anxiety     PTSD  . Hemiparesis and alteration of sensations as late effects of stroke (HCC) 03/04/2015   Past Surgical History  Procedure Laterality Date  . Hand surgery Right     "replaced bone in my finger"  . Angioplasty    . Carotid endarterectomy Left   . Coronary angioplasty with stent placement      left circumflex  . Cataract extraction, bilateral    . Tonsillectomy    . Tympanic membrane repair Right 1978    [name] 07/27/2010; "couldn'getting so t couldn't hear; had noise in my ear; didn't correct either  .  Hand contracture release Right     palm/notes 07/27/2010  . Esophagogastroduodenoscopy (egd) with esophageal dilation    . Insertion of vena cava filter  2013    h/o PE  . Endarterectomy Left 11/02/2014    Procedure: LEFT  CAROTID ARTERY ENDARTERECTOMY  WITH DACRON PATCH ANGIOPLASTY;  Surgeon: Sherren Kerns, MD;  Location: [facility] OR;  Service: Vascular;  Laterality: Left;   Current Outpatient Prescriptions on File Prior to Visit  Medication Sig Dispense Refill  . acetaminophen (TYLENOL) 500 MG tablet Take 1,000 mg by mouth 2 (two) times daily. Take every day per [facility]    . albuterol (PROVENTIL HFA;VENTOLIN HFA) 108 (90 Base) MCG/ACT inhaler Inhale 1-2 puffs into the lungs every 6 (six) hours as needed for wheezing or shortness of breath. 1 Inhaler 0  . ALPRAZolam (XANAX) 0.25 MG tablet Take 1 tablet (0.25 mg total) by mouth 2 (two) times daily as needed for anxiety. 60 tablet 3  . alum & mag hydroxide-simeth (MAALOX/MYLANTA) 200-200-20 MG/5ML  suspension Take 30 mLs by mouth every 6 (six) hours as needed for indigestion or heartburn.    [name] aspirin EC 81 MG EC tablet Take 1 tablet (81 mg total) by mouth daily.    . benzonatate (TESSALON) 100 MG capsule Take 1 capsule (100 mg total) by mouth every 8 (eight) hours. 21 capsule 0  . DEXILANT 60 MG capsule TAKE ONE CAPSULE BY MOUTH EVERY DAY 30 capsule 11  . dextromethorphan-guaiFENesin (ROBITUSSIN COLD COUGH+ CHEST) 10-100 MG/5ML liquid Take 10 mLs by mouth every 4 (four) hours as needed for cough.    . docusate sodium (COLACE) 100 MG capsule Take 1 capsule (100 mg total) by mouth at bedtime. 30 capsule 11  . doxazosin (CARDURA) 4 MG tablet Take 1 tablet (4 mg total) by mouth daily. 30 tablet 3  . DULoxetine (CYMBALTA) 30 MG capsule TAKE ONE CAPSULE BY MOUTH EVERY DAY 30 capsule 6  . famotidine (PEPCID) 20 MG tablet Take 1 tablet (20 mg total) by mouth 2 (two) times daily. 60 tablet 0  . finasteride (PROSCAR) 5 MG tablet Take 1 tablet (5 mg total) by mouth daily. 30 tablet 0  . furosemide (LASIX) 40 MG tablet Take 1 tablet (40 mg total) by mouth every other day. 30 tablet 0  . guaiFENesin 200 MG tablet Take 1 tablet (200 mg total) by mouth every 4 (four) hours as needed for cough or to loosen phlegm. 30 tablet 0  . hydrALAZINE (APRESOLINE) 50 MG tablet Take 1 tablet (50 mg total) by mouth 3 (three) times daily. 90 tablet 0  . HYDROcodone-acetaminophen (NORCO/VICODIN) 5-325 MG tablet Take 1 tablet by mouth every 6 (six) hours as needed for moderate pain. 60 tablet 0  . ipratropium (ATROVENT) 0.06 % nasal spray Place 2 sprays into both nostrils 4 (four) times daily. 15 mL 0  . latanoprost (XALATAN) 0.005 % ophthalmic solution Place 1 drop into both eyes at bedtime.    [name] loperamide (IMODIUM) 2 MG capsule Take 2 mg by mouth as needed for diarrhea or loose stools (Not to exceed 8 doses in 24 hours).    . magnesium hydroxide (MILK OF MAGNESIA) 400 MG/5ML suspension Take 30 mLs by mouth at bedtime as  needed for mild constipation.    . meclizine (ANTIVERT) 12.5 MG tablet Take 12.5 mg by mouth every 12 (twelve) hours as needed for dizziness.    . mupirocin ointment (BACTROBAN) 2 % Place into the nose 2 (two) times daily. (Patient not  taking: Reported on 05/02/2015) 22 g 0  . MYRBETRIQ 25 MG TB24 tablet TAKE 1 TABLET BY MOUTH EVERY DAY 30 tablet 11  . nebivolol (BYSTOLIC) 2.5 MG tablet Take 1 tablet (2.5 mg total) by mouth daily. 30 tablet 0  . nitroGLYCERIN (NITROSTAT) 0.4 MG SL tablet Place 0.4 mg under the tongue every 5 (five) minutes as needed. Chest pain    . NON FORMULARY Place 2 L into the nose at bedtime.    . ondansetron (ZOFRAN) 4 MG tablet Take 4 mg by mouth every 8 (eight) hours as needed for nausea.    . polyethylene glycol powder (GLYCOLAX/MIRALAX) powder MIX 17 GRAMS WITH 4 TO 6 OUNCES OF WATER AND DRINK ONCE DAILY AS NEEDED FOR CONSTIPATION 527 g 2  . polyvinyl alcohol (LIQUIFILM TEARS) 1.4 % ophthalmic solution Place 2 drops into both eyes 3 (three) times daily.    . predniSONE (DELTASONE) 10 MG tablet Take 1 tablet (10 mg total) by mouth daily with breakfast. 30 tablet 5  . senna-docusate (SENOKOT-S) 8.6-50 MG tablet Take 1 tablet by mouth at bedtime as needed for mild constipation. 30 tablet 0  . simvastatin (ZOCOR) 40 MG tablet Take 1 tablet (40 mg total) by mouth daily at 6 PM.    . sodium chloride (OCEAN) 0.65 % SOLN nasal spray Place 1 spray into both nostrils 2 (two) times daily. For 1 week 1 Bottle 0  . XARELTO 15 MG TABS tablet TAKE 1 TABLET BY MOUTH EVERY DAY WITH SUPPER 30 tablet 6   No current facility-administered medications on file prior to visit.   Allergies  Allergen Reactions  . Ativan [Lorazepam] Other (See Comments)    unknown  . Augmentin [Amoxicillin-Pot Clavulanate] Nausea And Vomiting    daughter states he can tolerate Amoxicillin ok Profuse vomiting. Unable to answer penicillin questions as pt is from Nursing home and unable to provide answers to  questions  . Belladonna Alkaloids     Unknown- on MAR  . Corticosteroids     Unknown- on MAR  . Fludrocortisone Acetate Other (See Comments)    "leg swelling"  . Parabens     Unknown- on MAR  . Scopace [Scopolamine] Other (See Comments)    *patch "Made him crazy" per patients daughter    Social History   Social History  . Marital Status: Married    Spouse Name: N/A  . Number of Children: 2  . Years of Education: HS   Occupational History  . retired    Social History Main Topics  . Smoking status: Never Smoker   . Smokeless tobacco: Never Used  . Alcohol Use: 0.0 oz/week    0 Standard drinks or equivalent per week     Comment: wine occasionally  . Drug Use: No  . Sexual Activity: No   Other Topics Concern  . 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   Patient drinks about 2 cups of caffeine daily.           Review of Systems  All other systems reviewed and are negative.      Objective:   Physical Exam  Constitutional: He appears well-developed and well-nourished.  Neck: Neck supple.  Cardiovascular: Normal rate and regular rhythm.   Murmur heard. Pulmonary/Chest: Effort normal and breath sounds normal. No respiratory distress. He has no wheezes. He has no rales.  Abdominal: Soft. He  exhibits distension. He exhibits no fluid wave, no ascites and no mass. Bowel sounds are decreased. There is no tenderness. There is no rebound.  Musculoskeletal: He exhibits no edema.  Vitals reviewed.         Assessment & Plan:  Abdominal bloating - Plan: DG Abd Acute W/Chest  Weight gain  Dyspnea  I see no evidence of fluid overload or pulmonary edema. What is noticeable as his abdominal distention and his abdominal bloating. I believe the patient may be severely constipated. Furthermore the abdominal bloating could be causing a mechanical effect on the lungs causing him to be  more short of breath. He denies any chest pain or cough or pleurisy or hemoptysis. Oxygen saturation is normal today at 98% on room air. I will give the patient Movantik 25 mg poqday for narcotic induced constipation as he is on hydrocodone for his. arm pain. I will also send him for 2 views of the abdomen x-ray along with a chest x-ray. We may need to evaluate for abdominal obstruction.  I will await the results of the x-rays

## 2015-05-10 ENCOUNTER — Telehealth: Payer: Self-pay | Admitting: Family Medicine

## 2015-05-10 DIAGNOSIS — R278 Other lack of coordination: Secondary | ICD-10-CM | POA: Diagnosis not present

## 2015-05-10 DIAGNOSIS — M6281 Muscle weakness (generalized): Secondary | ICD-10-CM | POA: Diagnosis not present

## 2015-05-10 NOTE — Telephone Encounter (Signed)
Patient's daughter is calling to get xray results

## 2015-05-11 NOTE — Telephone Encounter (Signed)
Pt's daughter aware on 05/10/15 - see lab note

## 2015-05-12 ENCOUNTER — Other Ambulatory Visit: Payer: Self-pay | Admitting: Family Medicine

## 2015-05-12 NOTE — Telephone Encounter (Signed)
Daughter called and states that the pt is having increased confusion and urine is really strong and the home needs and order or UA to check for possible UTI. Order for UA, C&S faxed to [facility].

## 2015-05-12 NOTE — Telephone Encounter (Signed)
Received fax wanting a refill on his Hydrocodone 5/325 mg - ? OK to Refill   Fax to [facility] [phone]

## 2015-05-13 DIAGNOSIS — R8290 Unspecified abnormal findings in urine: Secondary | ICD-10-CM | POA: Diagnosis not present

## 2015-05-13 DIAGNOSIS — M6281 Muscle weakness (generalized): Secondary | ICD-10-CM | POA: Diagnosis not present

## 2015-05-13 DIAGNOSIS — R278 Other lack of coordination: Secondary | ICD-10-CM | POA: Diagnosis not present

## 2015-05-13 MED ORDER — HYDROCODONE-ACETAMINOPHEN 5-325 MG PO TABS: 1.0000 | ORAL_TABLET | Freq: Four times a day (QID) | ORAL | Status: DC | PRN

## 2015-05-13 NOTE — Telephone Encounter (Signed)
Orders and RX faxed to [facility]

## 2015-05-13 NOTE — Telephone Encounter (Signed)
Absolutely.

## 2015-05-14 DIAGNOSIS — M6281 Muscle weakness (generalized): Secondary | ICD-10-CM | POA: Diagnosis not present

## 2015-05-14 DIAGNOSIS — R278 Other lack of coordination: Secondary | ICD-10-CM | POA: Diagnosis not present

## 2015-05-15 DIAGNOSIS — I2510 Atherosclerotic heart disease of native coronary artery without angina pectoris: Secondary | ICD-10-CM | POA: Diagnosis not present

## 2015-05-15 DIAGNOSIS — I252 Old myocardial infarction: Secondary | ICD-10-CM | POA: Diagnosis not present

## 2015-05-15 DIAGNOSIS — I10 Essential (primary) hypertension: Secondary | ICD-10-CM | POA: Diagnosis not present

## 2015-05-17 DIAGNOSIS — R278 Other lack of coordination: Secondary | ICD-10-CM | POA: Diagnosis not present

## 2015-05-17 DIAGNOSIS — M6281 Muscle weakness (generalized): Secondary | ICD-10-CM | POA: Diagnosis not present

## 2015-05-19 DIAGNOSIS — M6281 Muscle weakness (generalized): Secondary | ICD-10-CM | POA: Diagnosis not present

## 2015-05-19 DIAGNOSIS — R278 Other lack of coordination: Secondary | ICD-10-CM | POA: Diagnosis not present

## 2015-05-20 ENCOUNTER — Telehealth: Payer: Self-pay | Admitting: Family Medicine

## 2015-05-20 MED ORDER — NALOXEGOL OXALATE 25 MG PO TABS: 25.0000 mg | ORAL_TABLET | Freq: Every day | ORAL | Status: DC

## 2015-05-20 NOTE — Telephone Encounter (Signed)
[id number] Erskine SquibbJane calling to say that the movantik samples that were given to him are working great and would love to get rx for this sent to pharmacy [facility]

## 2015-05-20 NOTE — Telephone Encounter (Signed)
Medication called/sent to requested pharmacy  

## 2015-05-21 ENCOUNTER — Telehealth: Payer: Self-pay | Admitting: Family Medicine

## 2015-05-21 NOTE — Telephone Encounter (Signed)
PA submitted through [email or web address]   DX: narcotic induced constipation K59.09

## 2015-05-22 DIAGNOSIS — M6281 Muscle weakness (generalized): Secondary | ICD-10-CM | POA: Diagnosis not present

## 2015-05-22 DIAGNOSIS — R278 Other lack of coordination: Secondary | ICD-10-CM | POA: Diagnosis not present

## 2015-05-24 ENCOUNTER — Encounter: Payer: Self-pay | Admitting: Family

## 2015-05-24 DIAGNOSIS — R278 Other lack of coordination: Secondary | ICD-10-CM | POA: Diagnosis not present

## 2015-05-24 DIAGNOSIS — M6281 Muscle weakness (generalized): Secondary | ICD-10-CM | POA: Diagnosis not present

## 2015-05-24 MED ORDER — LACTULOSE 10 GM/15ML PO SOLN: ORAL | Status: DC

## 2015-05-24 NOTE — Telephone Encounter (Signed)
Order faxed to facility.   Call placed to facility to make them aware.

## 2015-05-24 NOTE — Telephone Encounter (Signed)
Switch to lactulose 10 mL poqday

## 2015-05-24 NOTE — Telephone Encounter (Signed)
Received PA determination.   {A denied.   Patient must try and fail: Consulose, Enulose, Generlac, or lactulose.   MD please advise.

## 2015-05-26 DIAGNOSIS — M6281 Muscle weakness (generalized): Secondary | ICD-10-CM | POA: Diagnosis not present

## 2015-05-26 DIAGNOSIS — R278 Other lack of coordination: Secondary | ICD-10-CM | POA: Diagnosis not present

## 2015-05-27 ENCOUNTER — Ambulatory Visit (INDEPENDENT_AMBULATORY_CARE_PROVIDER_SITE_OTHER): Payer: Medicare Other | Admitting: Family

## 2015-05-27 ENCOUNTER — Encounter: Payer: Self-pay | Admitting: Family

## 2015-05-27 ENCOUNTER — Ambulatory Visit (HOSPITAL_COMMUNITY)
Admission: RE | Admit: 2015-05-27 | Discharge: 2015-05-27 | Disposition: A | Discharge status: Home / Self Care | Payer: Medicare Other | Source: Ambulatory Visit | Attending: Family | Admitting: Family

## 2015-05-27 VITALS — BP 126/76 | HR 95 | Temp 97.9°F | Resp 22 | Ht 72.0 in | Wt 212.0 lb

## 2015-05-27 DIAGNOSIS — E785 Hyperlipidemia, unspecified: Secondary | ICD-10-CM | POA: Insufficient documentation

## 2015-05-27 DIAGNOSIS — I2510 Atherosclerotic heart disease of native coronary artery without angina pectoris: Secondary | ICD-10-CM | POA: Diagnosis not present

## 2015-05-27 DIAGNOSIS — Z9889 Other specified postprocedural states: Secondary | ICD-10-CM

## 2015-05-27 DIAGNOSIS — Z48812 Encounter for surgical aftercare following surgery on the circulatory system: Secondary | ICD-10-CM

## 2015-05-27 DIAGNOSIS — I6523 Occlusion and stenosis of bilateral carotid arteries: Secondary | ICD-10-CM | POA: Diagnosis not present

## 2015-05-27 DIAGNOSIS — I10 Essential (primary) hypertension: Secondary | ICD-10-CM | POA: Insufficient documentation

## 2015-05-27 DIAGNOSIS — I6522 Occlusion and stenosis of left carotid artery: Secondary | ICD-10-CM | POA: Diagnosis not present

## 2015-05-27 DIAGNOSIS — I6521 Occlusion and stenosis of right carotid artery: Secondary | ICD-10-CM | POA: Diagnosis not present

## 2015-05-27 DIAGNOSIS — I739 Peripheral vascular disease, unspecified: Secondary | ICD-10-CM | POA: Diagnosis not present

## 2015-05-27 NOTE — Patient Instructions (Signed)
Stroke Prevention Some medical conditions and behaviors are associated with an increased chance of having a stroke. You may prevent a stroke by making healthy choices and managing medical conditions. HOW CAN I REDUCE MY RISK OF HAVING A STROKE?   Stay physically active. Get at least 30 minutes of activity on most or all days.  Do not smoke. It may also be helpful to avoid exposure to secondhand smoke.  Limit alcohol use. Moderate alcohol use is considered to be:  No more than 2 drinks per day for men.  No more than 1 drink per day for nonpregnant women.  Eat healthy foods. This involves:  Eating 5 or more servings of fruits and vegetables a day.  Making dietary changes that address high blood pressure (hypertension), high cholesterol, diabetes, or obesity.  Manage your cholesterol levels.  Making food choices that are high in fiber and low in saturated fat, trans fat, and cholesterol may control cholesterol levels.  Take any prescribed medicines to control cholesterol as directed by your health care provider.  Manage your diabetes.  Controlling your carbohydrate and sugar intake is recommended to manage diabetes.  Take any prescribed medicines to control diabetes as directed by your health care provider.  Control your hypertension.  Making food choices that are low in salt (sodium), saturated fat, trans fat, and cholesterol is recommended to manage hypertension.  Ask your health care provider if you need treatment to lower your blood pressure. Take any prescribed medicines to control hypertension as directed by your health care provider.  If you are 18-39 years of age, have your blood pressure checked every 3-5 years. If you are 40 years of age or older, have your blood pressure checked every year.  Maintain a healthy weight.  Reducing calorie intake and making food choices that are low in sodium, saturated fat, trans fat, and cholesterol are recommended to manage  weight.  Stop drug abuse.  Avoid taking birth control pills.  Talk to your health care provider about the risks of taking birth control pills if you are over 35 years old, smoke, get migraines, or have ever had a blood clot.  Get evaluated for sleep disorders (sleep apnea).  Talk to your health care provider about getting a sleep evaluation if you snore a lot or have excessive sleepiness.  Take medicines only as directed by your health care provider.  For some people, aspirin or blood thinners (anticoagulants) are helpful in reducing the risk of forming abnormal blood clots that can lead to stroke. If you have the irregular heart rhythm of atrial fibrillation, you should be on a blood thinner unless there is a good reason you cannot take them.  Understand all your medicine instructions.  Make sure that other conditions (such as anemia or atherosclerosis) are addressed. SEEK IMMEDIATE MEDICAL CARE IF:   You have sudden weakness or numbness of the face, arm, or leg, especially on one side of the body.  Your face or eyelid droops to one side.  You have sudden confusion.  You have trouble speaking (aphasia) or understanding.  You have sudden trouble seeing in one or both eyes.  You have sudden trouble walking.  You have dizziness.  You have a loss of balance or coordination.  You have a sudden, severe headache with no known cause.  You have new chest pain or an irregular heartbeat. Any of these symptoms may represent a serious problem that is an emergency. Do not wait to see if the symptoms will   go away. Get medical help at once. Call your local emergency services (911 in [place] Do not drive yourself to the hospital.   This information is not intended to replace advice given to you by your health care provider. Make sure you discuss any questions you have with your health care provider.   Document Released: 04/06/2004 Document Revised: 03/20/2014 Document Reviewed:  08/30/2012 Elsevier Interactive Patient Education 2016 [employer]  

## 2015-05-27 NOTE — Progress Notes (Signed)
Chief Complaint: Extracranial Carotid Artery Stenosis   History of Present Illness  Jason [name] is a 82 y.o. male patient of Dr. Darrick Penna who returns today for follow-up s/p left carotid endarterectomy on 11/02/14.   Daughter states he has been hospitalized at [facility] or [facility] with 2 small stokes in October and November 2016. He is residing in an assisted living facility.  About 2 months ago he had left hand weakness but dtr states he was hyperflexing his left wrist to get himself out of bed. Dtr states he has had fluid retention in his legs. Has had a couple of episodes of fainting.  He has trouble with dyspnea and constipation. Pt takes opioids for chronic pain management in multiple joints and low back.  Dr. Anne Hahn, neurologist evaluated pt in December 2016; his assessment is as follows: 1. Cerebrovascular disease, left carotid endarterectomy 2. Chronic dizziness 3. Gait disorder The patient is on maximal medical therapy currently for cerebrovascular disease. He continues to have dizziness that has actually worsened after the carotid endarterectomy. He is on Xarelto and aspirin he is to continue his physical therapy, I have little else to offer him for the symptoms he is having. He is on some meclizine to take if he needs for the dizziness. He will follow-up in 6 months, sooner if needed.    Pt Diabetic: no Pt smoker: non-smoker  Pt meds include: Statin : yes ASA: yes Other anticoagulants/antiplatelets: Xarelto prescribed by his cardiologist, Dr. Excell Seltzer; pt has a hx of atrial fib, PE, DVT, MI, and several strokes   Past Medical History  Diagnosis Date  . PVD (peripheral vascular disease) (HCC)   . CAD (coronary artery disease)     a. 12/2005 - PCI to OM  . Hyperlipidemia   . Cerebrovascular accident (HCC)   . 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 GERD (gastroesophageal reflux disease)   . Edema   . Vertigo   . IBS  (irritable bowel syndrome)   . Duodenitis   . Gastritis   . Esophageal stricture   . Cellulitis   . Pulmonary embolism (HCC)   . Prostatic hypertrophy   . Frequent falls   . Subdural hematoma (HCC)   . PTSD (post-traumatic stress disorder)   . Vertigo   . TIA (transient ischemic attack)     a. 01/2006 and 05/2006.   [name] Hiatal hernia     periferal neuropathy  . Hx of echocardiogram     Echo 9/13:  Mild LVH, EF 60-65%, Gr 1 diast dysfn, mild AI, mild BAE  . Gout   . Chronic low back pain   . Gait disorder   . Peripheral neuropathy (HCC)   . Hydrocele   . History of shingles     Left lumbar  . HOH (hard of hearing)     hearing aid, totally in R ear ( no hearing aid in L )   . Myocardial infarction [facility]) "years ago"    "dr said I'd had a small one"  . Daily headache   . Depression   . Urinary frequency   . Blind left eye   . Hypertension   . Dysrhythmia   . Arthritis   . Anxiety     PTSD  . Hemiparesis and alteration of sensations as late effects of stroke (HCC) 03/04/2015    Social History Social History  Substance Use Topics  . Smoking status: Never Smoker   .  Smokeless tobacco: Never Used  . Alcohol Use: 0.0 oz/week    0 Standard drinks or equivalent per week     Comment: wine occasionally    Family History Family History  Problem Relation Age of Onset  . Colon cancer Neg Hx   . Coronary artery disease Brother   . Heart attack Father   . Migraines Mother   . Breast cancer Sister   . Dementia Sister   . Renal Disease Brother     Surgical History Past Surgical History  Procedure Laterality Date  . Hand surgery Right     "replaced bone in my finger"  . Angioplasty    . Carotid endarterectomy Left   . Coronary angioplasty with stent placement      left circumflex  . Cataract extraction, bilateral    . Tonsillectomy    . Tympanic membrane repair Right 1978    [name] 07/27/2010; "couldn'getting so t couldn't hear; had noise in my ear; didn't correct  either  . Hand contracture release Right     palm/notes 07/27/2010  . Esophagogastroduodenoscopy (egd) with esophageal dilation    . Insertion of vena cava filter  2013    h/o PE  . Endarterectomy Left 11/02/2014    Procedure: LEFT  CAROTID ARTERY ENDARTERECTOMY  WITH DACRON PATCH ANGIOPLASTY;  Surgeon: Sherren Kerns, MD;  Location: [facility] OR;  Service: Vascular;  Laterality: Left;    Allergies  Allergen Reactions  . Ativan [Lorazepam] Other (See Comments)    unknown  . Augmentin [Amoxicillin-Pot Clavulanate] Nausea And Vomiting    daughter states he can tolerate Amoxicillin ok Profuse vomiting. Unable to answer penicillin questions as pt is from Nursing home and unable to provide answers to questions  . Belladonna Alkaloids     Unknown- on MAR  . Corticosteroids     Unknown- on MAR  . Fludrocortisone Acetate Other (See Comments)    "leg swelling"  . Parabens     Unknown- on MAR  . Scopace [Scopolamine] Other (See Comments)    *patch "Made him crazy" per patients daughter     Current Outpatient Prescriptions  Medication Sig Dispense Refill  . acetaminophen (TYLENOL) 500 MG tablet Take 1,000 mg by mouth 2 (two) times daily. Take every day per [facility]    . albuterol (PROVENTIL HFA;VENTOLIN HFA) 108 (90 Base) MCG/ACT inhaler Inhale 1-2 puffs into the lungs every 6 (six) hours as needed for wheezing or shortness of breath. 1 Inhaler 0  . ALPRAZolam (XANAX) 0.25 MG tablet Take 1 tablet (0.25 mg total) by mouth 2 (two) times daily as needed for anxiety. 60 tablet 3  . alum & mag hydroxide-simeth (MAALOX/MYLANTA) 200-200-20 MG/5ML suspension Take 30 mLs by mouth every 6 (six) hours as needed for indigestion or heartburn.    [name] aspirin EC 81 MG EC tablet Take 1 tablet (81 mg total) by mouth daily.    . benzonatate (TESSALON) 100 MG capsule Take 1 capsule (100 mg total) by mouth every 8 (eight) hours. 21 capsule 0  . DEXILANT 60 MG capsule TAKE ONE CAPSULE BY MOUTH EVERY DAY 30 capsule 11  .  dextromethorphan-guaiFENesin (ROBITUSSIN COLD COUGH+ CHEST) 10-100 MG/5ML liquid Take 10 mLs by mouth every 4 (four) hours as needed for cough.    . docusate sodium (COLACE) 100 MG capsule Take 1 capsule (100 mg total) by mouth at bedtime. 30 capsule 11  . doxazosin (CARDURA) 4 MG tablet Take 1 tablet (4 mg total) by mouth daily. 30 tablet 3  .  DULoxetine (CYMBALTA) 30 MG capsule TAKE ONE CAPSULE BY MOUTH EVERY DAY 30 capsule 6  . famotidine (PEPCID) 20 MG tablet Take 1 tablet (20 mg total) by mouth 2 (two) times daily. 60 tablet 0  . finasteride (PROSCAR) 5 MG tablet Take 1 tablet (5 mg total) by mouth daily. 30 tablet 0  . furosemide (LASIX) 40 MG tablet Take 1 tablet (40 mg total) by mouth every other day. 30 tablet 0  . guaiFENesin 200 MG tablet Take 1 tablet (200 mg total) by mouth every 4 (four) hours as needed for cough or to loosen phlegm. 30 tablet 0  . hydrALAZINE (APRESOLINE) 50 MG tablet Take 1 tablet (50 mg total) by mouth 3 (three) times daily. 90 tablet 0  . HYDROcodone-acetaminophen (NORCO/VICODIN) 5-325 MG tablet Take 1 tablet by mouth every 6 (six) hours as needed for moderate pain. 60 tablet 0  . ipratropium (ATROVENT) 0.06 % nasal spray Place 2 sprays into both nostrils 4 (four) times daily. 15 mL 0  . lactulose (CHRONULAC) 10 GM/15ML solution Take 10mL PO QD 240 mL 0  . latanoprost (XALATAN) 0.005 % ophthalmic solution Place 1 drop into both eyes at bedtime.    [name]. loperamide (IMODIUM) 2 MG capsule Take 2 mg by mouth as needed for diarrhea or loose stools (Not to exceed 8 doses in 24 hours).    . magnesium hydroxide (MILK OF MAGNESIA) 400 MG/5ML suspension Take 30 mLs by mouth at bedtime as needed for mild constipation.    . meclizine (ANTIVERT) 12.5 MG tablet Take 12.5 mg by mouth every 12 (twelve) hours as needed for dizziness.    . mupirocin ointment (BACTROBAN) 2 % Place into the nose 2 (two) times daily. (Patient not taking: Reported on 05/02/2015) 22 g 0  . MYRBETRIQ 25 MG  TB24 tablet TAKE 1 TABLET BY MOUTH EVERY DAY 30 tablet 11  . nebivolol (BYSTOLIC) 2.5 MG tablet Take 1 tablet (2.5 mg total) by mouth daily. 30 tablet 0  . nitroGLYCERIN (NITROSTAT) 0.4 MG SL tablet Place 0.4 mg under the tongue every 5 (five) minutes as needed. Chest pain    . NON FORMULARY Place 2 L into the nose at bedtime.    . ondansetron (ZOFRAN) 4 MG tablet Take 4 mg by mouth every 8 (eight) hours as needed for nausea.    . polyethylene glycol powder (GLYCOLAX/MIRALAX) powder MIX 17 GRAMS WITH 4 TO 6 OUNCES OF WATER AND DRINK ONCE DAILY AS NEEDED FOR CONSTIPATION 527 g 2  . polyvinyl alcohol (LIQUIFILM TEARS) 1.4 % ophthalmic solution Place 2 drops into both eyes 3 (three) times daily.    . predniSONE (DELTASONE) 10 MG tablet Take 1 tablet (10 mg total) by mouth daily with breakfast. 30 tablet 5  . senna-docusate (SENOKOT-S) 8.6-50 MG tablet Take 1 tablet by mouth at bedtime as needed for mild constipation. 30 tablet 0  . simvastatin (ZOCOR) 40 MG tablet Take 1 tablet (40 mg total) by mouth daily at 6 PM.    . sodium chloride (OCEAN) 0.65 % SOLN nasal spray Place 1 spray into both nostrils 2 (two) times daily. For 1 week 1 Bottle 0  . XARELTO 15 MG TABS tablet TAKE 1 TABLET BY MOUTH EVERY DAY WITH SUPPER 30 tablet 6   No current facility-administered medications for this visit.    Review of Systems : See HPI for pertinent positives and negatives.  Physical Examination  Filed Vitals:   05/27/15 1426 05/27/15 1434  BP: 120/70 126/76  Pulse: 92 95  Temp: 97.9 F (36.6 C)   TempSrc: Oral   Resp: 22   Height: 6' (1.829 m)   Weight: 212 lb (96.163 kg)   SpO2: 96%    Body mass index is 28.75 kg/(m^2).  General: WDWN male in NAD GAIT: walks a few steps with assistance to his wheelchair Eyes: PERRLA Pulmonary:  Non-labored respirations, CTAB  Cardiac: irregular rhythm,  no detected murmur.  VASCULAR EXAM Carotid Bruits Right Left   Negative Negative    Aorta is not  palpable. Radial pulses are 2+ palpable and equal.                                                                                                                            LE Pulses Right Left       FEMORAL  faintly palpable  2+ palpable        POPLITEAL  not palpable   not palpable       POSTERIOR TIBIAL  faintly palpable   faintly palpable        DORSALIS PEDIS      ANTERIOR TIBIAL 1+ palpable  1+ palpable     Gastrointestinal: distended and hard, non tender, distant sounding bowel sounds, no palpable masses.  Musculoskeletal: no muscle atrophy/wasting. M/S 5/5 throughout, extremities without ischemic changes. 2-3+ pitting edema in both lower legs and feet.  Neurologic: A&O X 3; Appropriate Affect, Speech is normal CN 2-12 intact except is hard of hearing, hearing aids in place, pain and light touch intact in extremities, Motor exam as listed above.   Non-Invasive Vascular Imaging CAROTID DUPLEX 05/27/2015   Right ICA: <40% stenosis. Left ICA: CEA site with evidence of mild hyperplasia in the surgical bulb.   Assessment: AARIV MEDLOCK is a 82 y.o. male who is s/p left carotid endarterectomy on 11/02/14.  Today's carotid duplex suggests minimal right ICA stenosis and evidence of mild hyperplasia in the surgical bulb of the left CEA site.  He had strokes in October and November 2016. He has been evaluated by Dr. Anne Hahn, neurologist. Pt also has a hx of  of atrial fib, PE, DVT, and MI and takes Xarelto and ASA. He has chronic pain of arthritis and low back, takes chronic opioids for this and stays constipated; his abdomen is distended and hard, but not tender, today on exam.  Plan: Follow-up in 6 months with Carotid Duplex scan, according to postoperative CEA surveillance timeline.    I discussed in depth with the patient the nature of atherosclerosis, and emphasized the importance of maximal medical management including strict control of blood pressure, blood glucose, and  lipid levels, obtaining regular exercise, and continued cessation of smoking.  The patient is aware that without maximal medical management the underlying atherosclerotic disease process will progress, limiting the benefit of any interventions. The patient was given information about stroke prevention and what symptoms should prompt the patient to seek immediate medical care. Thank you  for allowing [place] to participate in this patient's care.  Charisse March, RN, MSN, FNP-C [facility] Office: [phone]  Clinic Physician: Edilia Bo  05/27/2015 2:19 PM

## 2015-05-28 NOTE — Addendum Note (Signed)
Addended by: Melodye PedMANESS-HARRISON, Mishael Krysiak C on: 05/28/2015 11:44 AM   Modules accepted: Orders

## 2015-05-30 DIAGNOSIS — M6281 Muscle weakness (generalized): Secondary | ICD-10-CM | POA: Diagnosis not present

## 2015-05-30 DIAGNOSIS — R278 Other lack of coordination: Secondary | ICD-10-CM | POA: Diagnosis not present

## 2015-05-31 ENCOUNTER — Telehealth: Payer: Self-pay | Admitting: Family Medicine

## 2015-05-31 DIAGNOSIS — M6281 Muscle weakness (generalized): Secondary | ICD-10-CM | POA: Diagnosis not present

## 2015-05-31 DIAGNOSIS — R278 Other lack of coordination: Secondary | ICD-10-CM | POA: Diagnosis not present

## 2015-05-31 NOTE — Telephone Encounter (Signed)
[facility] wheeping legs.  No open wounds just wheeping from fluid.  OK to send?

## 2015-05-31 NOTE — Telephone Encounter (Signed)
ok 

## 2015-05-31 NOTE — Telephone Encounter (Signed)
[facility] calling back please call her at [phone]

## 2015-05-31 NOTE — Telephone Encounter (Signed)
Order faxed to [facility].  

## 2015-06-01 ENCOUNTER — Telehealth: Payer: Self-pay | Admitting: Family Medicine

## 2015-06-01 NOTE — Telephone Encounter (Signed)
[facility], daughter states patient is bloated and "backed up"  Pt is c/o feeling tight and full.  Thinks he may have a blockage.  What can they do??  Only passing liquid with little balls, very uncomfortable.

## 2015-06-01 NOTE — Telephone Encounter (Signed)
Called daughter back, has just gotten to home.  Said Mr Jason [name] has had a BM and is feeling some better.  Has decided to wait on Mag citrate.

## 2015-06-01 NOTE — Telephone Encounter (Signed)
Can use mag citrate 1 bottle.

## 2015-06-02 ENCOUNTER — Encounter: Payer: Self-pay | Admitting: Family Medicine

## 2015-06-02 NOTE — Telephone Encounter (Signed)
[facility] calling regarding this patient, about an order? Please call back there was a message left and I think she said ms bryant?  [phone]

## 2015-06-03 DIAGNOSIS — S81802D Unspecified open wound, left lower leg, subsequent encounter: Secondary | ICD-10-CM | POA: Diagnosis not present

## 2015-06-03 DIAGNOSIS — I951 Orthostatic hypotension: Secondary | ICD-10-CM | POA: Diagnosis not present

## 2015-06-03 DIAGNOSIS — I739 Peripheral vascular disease, unspecified: Secondary | ICD-10-CM | POA: Diagnosis not present

## 2015-06-03 DIAGNOSIS — N183 Chronic kidney disease, stage 3 (moderate): Secondary | ICD-10-CM | POA: Diagnosis not present

## 2015-06-03 DIAGNOSIS — I130 Hypertensive heart and chronic kidney disease with heart failure and stage 1 through stage 4 chronic kidney disease, or unspecified chronic kidney disease: Secondary | ICD-10-CM | POA: Diagnosis not present

## 2015-06-03 DIAGNOSIS — H5442 Blindness, left eye, normal vision right eye: Secondary | ICD-10-CM | POA: Diagnosis not present

## 2015-06-03 DIAGNOSIS — E785 Hyperlipidemia, unspecified: Secondary | ICD-10-CM | POA: Diagnosis not present

## 2015-06-03 DIAGNOSIS — I480 Paroxysmal atrial fibrillation: Secondary | ICD-10-CM | POA: Diagnosis not present

## 2015-06-03 DIAGNOSIS — I2510 Atherosclerotic heart disease of native coronary artery without angina pectoris: Secondary | ICD-10-CM | POA: Diagnosis not present

## 2015-06-03 DIAGNOSIS — I69398 Other sequelae of cerebral infarction: Secondary | ICD-10-CM | POA: Diagnosis not present

## 2015-06-03 DIAGNOSIS — I5031 Acute diastolic (congestive) heart failure: Secondary | ICD-10-CM | POA: Diagnosis not present

## 2015-06-07 NOTE — Telephone Encounter (Signed)
This encounter was created in error - please disregard.

## 2015-06-08 ENCOUNTER — Telehealth: Payer: Self-pay | Admitting: *Deleted

## 2015-06-08 DIAGNOSIS — H5442 Blindness, left eye, normal vision right eye: Secondary | ICD-10-CM | POA: Diagnosis not present

## 2015-06-08 DIAGNOSIS — I2510 Atherosclerotic heart disease of native coronary artery without angina pectoris: Secondary | ICD-10-CM | POA: Diagnosis not present

## 2015-06-08 DIAGNOSIS — I69398 Other sequelae of cerebral infarction: Secondary | ICD-10-CM | POA: Diagnosis not present

## 2015-06-08 DIAGNOSIS — I951 Orthostatic hypotension: Secondary | ICD-10-CM | POA: Diagnosis not present

## 2015-06-08 DIAGNOSIS — I130 Hypertensive heart and chronic kidney disease with heart failure and stage 1 through stage 4 chronic kidney disease, or unspecified chronic kidney disease: Secondary | ICD-10-CM | POA: Diagnosis not present

## 2015-06-08 DIAGNOSIS — N183 Chronic kidney disease, stage 3 (moderate): Secondary | ICD-10-CM | POA: Diagnosis not present

## 2015-06-08 DIAGNOSIS — I480 Paroxysmal atrial fibrillation: Secondary | ICD-10-CM | POA: Diagnosis not present

## 2015-06-08 DIAGNOSIS — S81802D Unspecified open wound, left lower leg, subsequent encounter: Secondary | ICD-10-CM | POA: Diagnosis not present

## 2015-06-08 DIAGNOSIS — I5031 Acute diastolic (congestive) heart failure: Secondary | ICD-10-CM | POA: Diagnosis not present

## 2015-06-08 DIAGNOSIS — I739 Peripheral vascular disease, unspecified: Secondary | ICD-10-CM | POA: Diagnosis not present

## 2015-06-08 DIAGNOSIS — E785 Hyperlipidemia, unspecified: Secondary | ICD-10-CM | POA: Diagnosis not present

## 2015-06-08 NOTE — Telephone Encounter (Signed)
Given his chronic kidney disease, I would not want to increase his lasix unless his oxygen starts to drop or his sob gets worse.

## 2015-06-08 NOTE — Telephone Encounter (Signed)
Pt daughter calling [facility] and has noticed that he has increased swelling in his legs and ankles with some SOB, states O2 is good, daughter wants to know if you want to increase his lasix to everyday or make appt. Please advise!

## 2015-06-08 NOTE — Telephone Encounter (Signed)
Patient's daughter aware of providers recommendations and agrees with plan.

## 2015-06-10 DIAGNOSIS — I130 Hypertensive heart and chronic kidney disease with heart failure and stage 1 through stage 4 chronic kidney disease, or unspecified chronic kidney disease: Secondary | ICD-10-CM | POA: Diagnosis not present

## 2015-06-10 DIAGNOSIS — I951 Orthostatic hypotension: Secondary | ICD-10-CM | POA: Diagnosis not present

## 2015-06-10 DIAGNOSIS — S81802D Unspecified open wound, left lower leg, subsequent encounter: Secondary | ICD-10-CM | POA: Diagnosis not present

## 2015-06-10 DIAGNOSIS — E785 Hyperlipidemia, unspecified: Secondary | ICD-10-CM | POA: Diagnosis not present

## 2015-06-10 DIAGNOSIS — H5442 Blindness, left eye, normal vision right eye: Secondary | ICD-10-CM | POA: Diagnosis not present

## 2015-06-10 DIAGNOSIS — I2510 Atherosclerotic heart disease of native coronary artery without angina pectoris: Secondary | ICD-10-CM | POA: Diagnosis not present

## 2015-06-10 DIAGNOSIS — N183 Chronic kidney disease, stage 3 (moderate): Secondary | ICD-10-CM | POA: Diagnosis not present

## 2015-06-10 DIAGNOSIS — I5031 Acute diastolic (congestive) heart failure: Secondary | ICD-10-CM | POA: Diagnosis not present

## 2015-06-10 DIAGNOSIS — I739 Peripheral vascular disease, unspecified: Secondary | ICD-10-CM | POA: Diagnosis not present

## 2015-06-10 DIAGNOSIS — I69398 Other sequelae of cerebral infarction: Secondary | ICD-10-CM | POA: Diagnosis not present

## 2015-06-10 DIAGNOSIS — I480 Paroxysmal atrial fibrillation: Secondary | ICD-10-CM | POA: Diagnosis not present

## 2015-06-15 DIAGNOSIS — I69398 Other sequelae of cerebral infarction: Secondary | ICD-10-CM | POA: Diagnosis not present

## 2015-06-15 DIAGNOSIS — I951 Orthostatic hypotension: Secondary | ICD-10-CM | POA: Diagnosis not present

## 2015-06-15 DIAGNOSIS — E785 Hyperlipidemia, unspecified: Secondary | ICD-10-CM | POA: Diagnosis not present

## 2015-06-15 DIAGNOSIS — I2510 Atherosclerotic heart disease of native coronary artery without angina pectoris: Secondary | ICD-10-CM | POA: Diagnosis not present

## 2015-06-15 DIAGNOSIS — S81802D Unspecified open wound, left lower leg, subsequent encounter: Secondary | ICD-10-CM | POA: Diagnosis not present

## 2015-06-15 DIAGNOSIS — H5442 Blindness, left eye, normal vision right eye: Secondary | ICD-10-CM | POA: Diagnosis not present

## 2015-06-15 DIAGNOSIS — I252 Old myocardial infarction: Secondary | ICD-10-CM | POA: Diagnosis not present

## 2015-06-15 DIAGNOSIS — I5031 Acute diastolic (congestive) heart failure: Secondary | ICD-10-CM | POA: Diagnosis not present

## 2015-06-15 DIAGNOSIS — N183 Chronic kidney disease, stage 3 (moderate): Secondary | ICD-10-CM | POA: Diagnosis not present

## 2015-06-15 DIAGNOSIS — I10 Essential (primary) hypertension: Secondary | ICD-10-CM | POA: Diagnosis not present

## 2015-06-15 DIAGNOSIS — I130 Hypertensive heart and chronic kidney disease with heart failure and stage 1 through stage 4 chronic kidney disease, or unspecified chronic kidney disease: Secondary | ICD-10-CM | POA: Diagnosis not present

## 2015-06-15 DIAGNOSIS — I480 Paroxysmal atrial fibrillation: Secondary | ICD-10-CM | POA: Diagnosis not present

## 2015-06-15 DIAGNOSIS — I739 Peripheral vascular disease, unspecified: Secondary | ICD-10-CM | POA: Diagnosis not present

## 2015-06-17 ENCOUNTER — Other Ambulatory Visit: Payer: Self-pay | Admitting: Family Medicine

## 2015-06-17 ENCOUNTER — Telehealth: Payer: Self-pay | Admitting: *Deleted

## 2015-06-17 DIAGNOSIS — I5031 Acute diastolic (congestive) heart failure: Secondary | ICD-10-CM | POA: Diagnosis not present

## 2015-06-17 DIAGNOSIS — I951 Orthostatic hypotension: Secondary | ICD-10-CM | POA: Diagnosis not present

## 2015-06-17 DIAGNOSIS — N183 Chronic kidney disease, stage 3 (moderate): Secondary | ICD-10-CM | POA: Diagnosis not present

## 2015-06-17 DIAGNOSIS — S81802D Unspecified open wound, left lower leg, subsequent encounter: Secondary | ICD-10-CM | POA: Diagnosis not present

## 2015-06-17 DIAGNOSIS — E785 Hyperlipidemia, unspecified: Secondary | ICD-10-CM | POA: Diagnosis not present

## 2015-06-17 DIAGNOSIS — I69398 Other sequelae of cerebral infarction: Secondary | ICD-10-CM | POA: Diagnosis not present

## 2015-06-17 DIAGNOSIS — I480 Paroxysmal atrial fibrillation: Secondary | ICD-10-CM | POA: Diagnosis not present

## 2015-06-17 DIAGNOSIS — I2510 Atherosclerotic heart disease of native coronary artery without angina pectoris: Secondary | ICD-10-CM | POA: Diagnosis not present

## 2015-06-17 DIAGNOSIS — I130 Hypertensive heart and chronic kidney disease with heart failure and stage 1 through stage 4 chronic kidney disease, or unspecified chronic kidney disease: Secondary | ICD-10-CM | POA: Diagnosis not present

## 2015-06-17 DIAGNOSIS — I739 Peripheral vascular disease, unspecified: Secondary | ICD-10-CM | POA: Diagnosis not present

## 2015-06-17 DIAGNOSIS — H5442 Blindness, left eye, normal vision right eye: Secondary | ICD-10-CM | POA: Diagnosis not present

## 2015-06-17 MED ORDER — FUROSEMIDE 40 MG PO TABS: 40.0000 mg | ORAL_TABLET | Freq: Every day | ORAL | Status: DC

## 2015-06-17 MED ORDER — FUROSEMIDE 40 MG PO TABS: ORAL_TABLET | ORAL | Status: AC

## 2015-06-17 NOTE — Telephone Encounter (Signed)
Spoke with MD   New orders obtained to increase Lasix to 40 MG PO QD.   Call placed to [facility] and she was made aware.   Order faxed to facility.

## 2015-06-17 NOTE — Telephone Encounter (Signed)
Refill appropriate and filled per protocol. 

## 2015-06-17 NOTE — Telephone Encounter (Signed)
[facility] [phone].  States that patient seen today for BLE wraps. Reports that edema has increased, and L LE has increased weeping from open area on extremity. States that weeping on L LE is soaking through 2 ABD pads, Kerlix and Coban.   Patient is currently taking Lasix 40mg  PO QOD. Reports that patient also voiced C/O increased SOB. States that lung fields are clear, O2 sats are WNL (93-95%). Reports that patient is having SOB while moving or talking, and is resolved after resting briefly.   MD please advise.

## 2015-06-21 ENCOUNTER — Telehealth: Payer: Self-pay | Admitting: Family Medicine

## 2015-06-21 DIAGNOSIS — I482 Chronic atrial fibrillation, unspecified: Secondary | ICD-10-CM

## 2015-06-21 DIAGNOSIS — I5030 Unspecified diastolic (congestive) heart failure: Secondary | ICD-10-CM

## 2015-06-21 NOTE — Telephone Encounter (Signed)
Pt's daughter called requesting that Dr. Tanya NonesPickard order a hospital bed for him. She states that he has been sleeping in his chair because he can no longer get in and out of the bed. [name]

## 2015-06-22 ENCOUNTER — Other Ambulatory Visit: Payer: Self-pay | Admitting: Family Medicine

## 2015-06-22 ENCOUNTER — Ambulatory Visit (INDEPENDENT_AMBULATORY_CARE_PROVIDER_SITE_OTHER): Payer: Medicare Other | Admitting: Family Medicine

## 2015-06-22 DIAGNOSIS — R601 Generalized edema: Secondary | ICD-10-CM

## 2015-06-22 DIAGNOSIS — I480 Paroxysmal atrial fibrillation: Secondary | ICD-10-CM | POA: Diagnosis not present

## 2015-06-22 DIAGNOSIS — I69398 Other sequelae of cerebral infarction: Secondary | ICD-10-CM | POA: Diagnosis not present

## 2015-06-22 DIAGNOSIS — I739 Peripheral vascular disease, unspecified: Secondary | ICD-10-CM | POA: Diagnosis not present

## 2015-06-22 DIAGNOSIS — N183 Chronic kidney disease, stage 3 (moderate): Secondary | ICD-10-CM | POA: Diagnosis not present

## 2015-06-22 DIAGNOSIS — I2510 Atherosclerotic heart disease of native coronary artery without angina pectoris: Secondary | ICD-10-CM | POA: Diagnosis not present

## 2015-06-22 DIAGNOSIS — S81802D Unspecified open wound, left lower leg, subsequent encounter: Secondary | ICD-10-CM | POA: Diagnosis not present

## 2015-06-22 DIAGNOSIS — I130 Hypertensive heart and chronic kidney disease with heart failure and stage 1 through stage 4 chronic kidney disease, or unspecified chronic kidney disease: Secondary | ICD-10-CM | POA: Diagnosis not present

## 2015-06-22 DIAGNOSIS — S81812A Laceration without foreign body, left lower leg, initial encounter: Secondary | ICD-10-CM | POA: Diagnosis not present

## 2015-06-22 DIAGNOSIS — I5031 Acute diastolic (congestive) heart failure: Secondary | ICD-10-CM | POA: Diagnosis not present

## 2015-06-22 DIAGNOSIS — H5442 Blindness, left eye, normal vision right eye: Secondary | ICD-10-CM | POA: Diagnosis not present

## 2015-06-22 DIAGNOSIS — I951 Orthostatic hypotension: Secondary | ICD-10-CM | POA: Diagnosis not present

## 2015-06-22 DIAGNOSIS — E785 Hyperlipidemia, unspecified: Secondary | ICD-10-CM | POA: Diagnosis not present

## 2015-06-22 NOTE — Progress Notes (Signed)
Subjective:    Patient ID: Jason [name], male    DOB: [birth year], 82 y.o.   MRN: [id number]  HPI  Reports weight gain and shortness of breath. He is unable to stand to be weighed today as he nearly falls off our scale. However recently at the cardiologist office, his weight was 204 lbs which is up significantly from last time he was at his neurologist office. Wt Readings from Last 3 Encounters:  05/27/15 212 lb (96.163 kg)  04/16/15 204 lb 6.4 oz (92.715 kg)  03/04/15 195 lb (88.451 kg)   in addition to the weight gain he reports increasing weakness and shortness of breath. His daughter is concerned that he may be retaining fluid. Over the weekend they went to the hospital where a chest x-ray showed no evidence of pulmonary edema or fluid overload on exam. Today on my exam he has no pitting edema in his legs. He has no JVD. There are no pulmonary crackles.  However he has noticeable abdominal distention is significantly bloated. He denies having a bowel movement recently. He denies any flatus. He denies any nausea or projectile vomiting or symptoms of a bowel obstruction.  At that time, my plan was:  I see no evidence of fluid overload or pulmonary edema. What is noticeable as his abdominal distention and his abdominal bloating. I believe the patient may be severely constipated. Furthermore the abdominal bloating could be causing a mechanical effect on the lungs causing him to be more short of breath. He denies any chest pain or cough or pleurisy or hemoptysis. Oxygen saturation is normal today at 98% on room air. I will give the patient Movantik 25 mg poqday for narcotic induced constipation as he is on hydrocodone for his. arm pain. I will also send him for 2 views of the abdomen x-ray along with a chest x-ray. We may need to evaluate for abdominal obstruction.  I will await the results of the x-rays  06/22/15 Patient is referred in from his assisted living facility due to frequent falls and  deconditioning.. He appears very bloated. He has marked abdominal distention. There are large varicosities streaking around his umbilicus. There is a palpable fluid wave on his abdomen. He has +2 pitting edema in both legs distal to the abdomen. He reports shortness of breath however his lungs are clear to auscultation bilaterally. I believe that his shortness of breath is secondary to compression due to the abdominal distention and what I suspect is anasarca and ascites. The patient fell in a parking lot and suffered a large skin tear to his upper left shin. The area is approximately 4 cm x 12 cm. Using a pair of scissors I bluntly dissected the avascular skin from the skin tear. I then covered the wound with 3 pieces of petroleum gauze and applied a pressure dressing wrapped in Coban and. The wound was rinsed vigorously with normal saline prior to the dressing.  Past Medical History  Diagnosis Date  . PVD (peripheral vascular disease) (HCC)   . CAD (coronary artery disease)     a. 12/2005 - PCI to OM  . Hyperlipidemia   . Cerebrovascular accident (HCC)   . 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 GERD (gastroesophageal reflux disease)   . Edema   . Vertigo   . IBS (irritable bowel syndrome)   . Duodenitis   . Gastritis   . Esophageal stricture   . Cellulitis   .  Pulmonary embolism (HCC)   . Prostatic hypertrophy   . Frequent falls   . Subdural hematoma (HCC)   . PTSD (post-traumatic stress disorder)   . Vertigo   . TIA (transient ischemic attack)     a. 01/2006 and 05/2006.   [name] Hiatal hernia     periferal neuropathy  . Hx of echocardiogram     Echo 9/13:  Mild LVH, EF 60-65%, Gr 1 diast dysfn, mild AI, mild BAE  . Gout   . Chronic low back pain   . Gait disorder   . Peripheral neuropathy (HCC)   . Hydrocele   . History of shingles     Left lumbar  . HOH (hard of hearing)     hearing aid, totally in R ear ( no hearing aid in  L )   . Myocardial infarction [facility]) "years ago"    "dr said I'd had a small one"  . Daily headache   . Depression   . Urinary frequency   . Blind left eye   . Hypertension   . Dysrhythmia   . Arthritis   . Anxiety     PTSD  . Hemiparesis and alteration of sensations as late effects of stroke (HCC) 03/04/2015   Past Surgical History  Procedure Laterality Date  . Hand surgery Right     "replaced bone in my finger"  . Angioplasty    . Carotid endarterectomy Left   . Coronary angioplasty with stent placement      left circumflex  . Cataract extraction, bilateral    . Tonsillectomy    . Tympanic membrane repair Right 1978    [name] 07/27/2010; "couldn'getting so t couldn't hear; had noise in my ear; didn't correct either  . Hand contracture release Right     palm/notes 07/27/2010  . Esophagogastroduodenoscopy (egd) with esophageal dilation    . Insertion of vena cava filter  2013    h/o PE  . Endarterectomy Left 11/02/2014    Procedure: LEFT  CAROTID ARTERY ENDARTERECTOMY  WITH DACRON PATCH ANGIOPLASTY;  Surgeon: Sherren Kerns, MD;  Location: [facility] OR;  Service: Vascular;  Laterality: Left;   Current Outpatient Prescriptions on File Prior to Visit  Medication Sig Dispense Refill  . acetaminophen (TYLENOL) 500 MG tablet Take 1,000 mg by mouth 2 (two) times daily. Take every day per [facility]    . albuterol (PROVENTIL HFA;VENTOLIN HFA) 108 (90 Base) MCG/ACT inhaler Inhale 1-2 puffs into the lungs every 6 (six) hours as needed for wheezing or shortness of breath. 1 Inhaler 0  . ALPRAZolam (XANAX) 0.25 MG tablet Take 1 tablet (0.25 mg total) by mouth 2 (two) times daily as needed for anxiety. 60 tablet 3  . alum & mag hydroxide-simeth (MAALOX/MYLANTA) 200-200-20 MG/5ML suspension Take 30 mLs by mouth every 6 (six) hours as needed for indigestion or heartburn.    [name] aspirin EC 81 MG EC tablet Take 1 tablet (81 mg total) by mouth daily.    . benzonatate (TESSALON) 100 MG capsule Take 1 capsule  (100 mg total) by mouth every 8 (eight) hours. 21 capsule 0  . DEXILANT 60 MG capsule TAKE ONE CAPSULE BY MOUTH EVERY DAY 30 capsule 11  . dextromethorphan-guaiFENesin (ROBITUSSIN COLD COUGH+ CHEST) 10-100 MG/5ML liquid Take 10 mLs by mouth every 4 (four) hours as needed for cough.    . docusate sodium (COLACE) 100 MG capsule Take 1 capsule (100 mg total) by mouth at bedtime. 30 capsule 11  . doxazosin (CARDURA) 4  MG tablet Take 1 tablet (4 mg total) by mouth daily. 30 tablet 3  . DULoxetine (CYMBALTA) 30 MG capsule TAKE ONE CAPSULE BY MOUTH EVERY DAY 30 capsule 6  . famotidine (PEPCID) 20 MG tablet Take 1 tablet (20 mg total) by mouth 2 (two) times daily. 60 tablet 0  . finasteride (PROSCAR) 5 MG tablet Take 1 tablet (5 mg total) by mouth daily. 30 tablet 0  . furosemide (LASIX) 40 MG tablet TAKE 1 TABLET(40 MG) BY MOUTH DAILY 90 tablet 0  . guaiFENesin 200 MG tablet Take 1 tablet (200 mg total) by mouth every 4 (four) hours as needed for cough or to loosen phlegm. 30 tablet 0  . hydrALAZINE (APRESOLINE) 50 MG tablet Take 1 tablet (50 mg total) by mouth 3 (three) times daily. 90 tablet 0  . HYDROcodone-acetaminophen (NORCO/VICODIN) 5-325 MG tablet Take 1 tablet by mouth every 6 (six) hours as needed for moderate pain. 60 tablet 0  . ipratropium (ATROVENT) 0.06 % nasal spray Place 2 sprays into both nostrils 4 (four) times daily. 15 mL 0  . lactulose (CHRONULAC) 10 GM/15ML solution Take 10mL PO QD 240 mL 0  . latanoprost (XALATAN) 0.005 % ophthalmic solution Place 1 drop into both eyes at bedtime.    [name] loperamide (IMODIUM) 2 MG capsule Take 2 mg by mouth as needed for diarrhea or loose stools (Not to exceed 8 doses in 24 hours).    . magnesium hydroxide (MILK OF MAGNESIA) 400 MG/5ML suspension Take 30 mLs by mouth at bedtime as needed for mild constipation.    . meclizine (ANTIVERT) 12.5 MG tablet Take 12.5 mg by mouth every 12 (twelve) hours as needed for dizziness.    [name] MYRBETRIQ 25 MG TB24  tablet TAKE 1 TABLET BY MOUTH EVERY DAY 30 tablet 11  . nebivolol (BYSTOLIC) 2.5 MG tablet Take 1 tablet (2.5 mg total) by mouth daily. 30 tablet 0  . nitroGLYCERIN (NITROSTAT) 0.4 MG SL tablet Place 0.4 mg under the tongue every 5 (five) minutes as needed. Chest pain    . NON FORMULARY Place 2 L into the nose at bedtime.    . ondansetron (ZOFRAN) 4 MG tablet Take 4 mg by mouth every 8 (eight) hours as needed for nausea.    . polyethylene glycol powder (GLYCOLAX/MIRALAX) powder MIX 17 GRAMS WITH 4 TO 6 OUNCES OF WATER AND DRINK ONCE DAILY AS NEEDED FOR CONSTIPATION 527 g 2  . polyvinyl alcohol (LIQUIFILM TEARS) 1.4 % ophthalmic solution Place 2 drops into both eyes 3 (three) times daily.    . predniSONE (DELTASONE) 10 MG tablet Take 1 tablet (10 mg total) by mouth daily with breakfast. 30 tablet 5  . senna-docusate (SENOKOT-S) 8.6-50 MG tablet Take 1 tablet by mouth at bedtime as needed for mild constipation. 30 tablet 0  . simvastatin (ZOCOR) 40 MG tablet Take 1 tablet (40 mg total) by mouth daily at 6 PM.    . XARELTO 15 MG TABS tablet TAKE 1 TABLET BY MOUTH EVERY DAY WITH SUPPER 30 tablet 6   No current facility-administered medications on file prior to visit.   Allergies  Allergen Reactions  . Ativan [Lorazepam] Other (See Comments)    unknown  . Augmentin [Amoxicillin-Pot Clavulanate] Nausea And Vomiting    daughter states he can tolerate Amoxicillin ok Profuse vomiting. Unable to answer penicillin questions as pt is from Nursing home and unable to provide answers to questions  . Belladonna Alkaloids     Unknown- on MAR  .  Corticosteroids     Unknown- on MAR  . Fludrocortisone Acetate Other (See Comments)    "leg swelling"  . Parabens     Unknown- on MAR  . Scopace [Scopolamine] Other (See Comments)    *patch "Made him crazy" per patients daughter    Social History   Social History  . Marital Status: Married    Spouse Name: N/A  . Number of Children: 2  . Years of  Education: HS   Occupational History  . retired    Social History Main Topics  . Smoking status: Never Smoker   . Smokeless tobacco: Never Used  . Alcohol Use: 0.0 oz/week    0 Standard drinks or equivalent per week     Comment: wine occasionally  . Drug Use: No  . Sexual Activity: No   Other Topics Concern  . 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   Patient drinks about 2 cups of caffeine daily.           Review of Systems  All other systems reviewed and are negative.      Objective:   Physical Exam  Constitutional: He appears well-developed and well-nourished.  Neck: Neck supple.  Cardiovascular: Normal rate and regular rhythm.   Murmur heard. Pulmonary/Chest: Effort normal and breath sounds normal. No respiratory distress. He has no wheezes. He has no rales.  Abdominal: Soft. He exhibits distension. He exhibits no fluid wave, no ascites and no mass. Bowel sounds are decreased. There is no tenderness. There is no rebound.  Musculoskeletal: He exhibits no edema.  Vitals reviewed.         Assessment & Plan:  Anasarca - Plan: COMPLETE METABOLIC PANEL WITH GFR, CBC with Differential/Platelet  Skin tear of lower leg without complication, left, initial encounter  I want the patient to obtain a CT scan of the abdomen and pelvis as soon as possible. I am concerned that he has anasarca and ascites and I suspect some type of mass in or around the liver that could be compressing the inferior vena cava. I believe that the abdominal distention is causing his shortness of breath by compressing on his lungs. Prognosis is very guarded. Hemostasis achieved with a pressure dressing. Recheck the patient in 48 hours to review the CT findings and to determine a plan of care. If there is cancer seen on the CT scan as I expect, I would recommend hospice and comfort care. Hopefully I will be able  to switch the patient to a Tegaderm more comfortable dressing in 48 hours regarding the skin tear on his left leg.

## 2015-06-22 NOTE — Telephone Encounter (Signed)
ok 

## 2015-06-22 NOTE — Telephone Encounter (Signed)
OK to order 

## 2015-06-23 ENCOUNTER — Ambulatory Visit (HOSPITAL_COMMUNITY)
Admission: RE | Admit: 2015-06-23 | Discharge: 2015-06-23 | Disposition: A | Discharge status: Home / Self Care | Payer: Medicare Other | Source: Ambulatory Visit | Attending: Family Medicine | Admitting: Family Medicine

## 2015-06-23 ENCOUNTER — Other Ambulatory Visit: Payer: Self-pay | Admitting: Family Medicine

## 2015-06-23 ENCOUNTER — Other Ambulatory Visit (HOSPITAL_COMMUNITY): Payer: Medicare Other

## 2015-06-23 DIAGNOSIS — N329 Bladder disorder, unspecified: Secondary | ICD-10-CM | POA: Diagnosis not present

## 2015-06-23 DIAGNOSIS — R109 Unspecified abdominal pain: Secondary | ICD-10-CM | POA: Diagnosis not present

## 2015-06-23 DIAGNOSIS — I2510 Atherosclerotic heart disease of native coronary artery without angina pectoris: Secondary | ICD-10-CM | POA: Insufficient documentation

## 2015-06-23 DIAGNOSIS — I7090 Unspecified atherosclerosis: Secondary | ICD-10-CM | POA: Diagnosis not present

## 2015-06-23 DIAGNOSIS — N200 Calculus of kidney: Secondary | ICD-10-CM | POA: Diagnosis not present

## 2015-06-23 DIAGNOSIS — I517 Cardiomegaly: Secondary | ICD-10-CM | POA: Diagnosis not present

## 2015-06-23 DIAGNOSIS — R601 Generalized edema: Secondary | ICD-10-CM | POA: Insufficient documentation

## 2015-06-23 LAB — CBC WITH DIFFERENTIAL/PLATELET
Basophils Absolute: 0 cells/uL (ref 0–200)
Basophils Relative: 0 %
EOS ABS: 72 {cells}/uL (ref 15–500)
Eosinophils Relative: 1 %
HEMATOCRIT: 35.6 % — AB (ref 38.5–50.0)
Hemoglobin: 11.4 g/dL — ABNORMAL LOW (ref 13.0–17.0)
LYMPHS PCT: 14 %
Lymphs Abs: 1008 cells/uL (ref 850–3900)
MCH: 30.6 pg (ref 27.0–33.0)
MCHC: 32 g/dL (ref 32.0–36.0)
MCV: 95.7 fL (ref 80.0–100.0)
MONO ABS: 360 {cells}/uL (ref 200–950)
MPV: 10.1 fL (ref 7.5–12.5)
Monocytes Relative: 5 %
NEUTROS PCT: 80 %
Neutro Abs: 5760 cells/uL (ref 1500–7800)
Platelets: 205 10*3/uL (ref 140–400)
RBC: 3.72 MIL/uL — ABNORMAL LOW (ref 4.20–5.80)
RDW: 16 % — AB (ref 11.0–15.0)
WBC: 7.2 10*3/uL (ref 3.8–10.8)

## 2015-06-23 LAB — COMPLETE METABOLIC PANEL WITH GFR
ALT: 20 U/L (ref 9–46)
AST: 23 U/L (ref 10–35)
Albumin: 3.4 g/dL — ABNORMAL LOW (ref 3.6–5.1)
Alkaline Phosphatase: 51 U/L (ref 40–115)
BUN: 40 mg/dL — AB (ref 7–25)
CALCIUM: 7.9 mg/dL — AB (ref 8.6–10.3)
CHLORIDE: 102 mmol/L (ref 98–110)
CO2: 24 mmol/L (ref 20–31)
CREATININE: 1.9 mg/dL — AB (ref 0.70–1.11)
GFR, Est African American: 34 mL/min — ABNORMAL LOW (ref >=60)
GFR, Est Non African American: 30 mL/min — ABNORMAL LOW (ref >=60)
Glucose, Bld: 103 mg/dL — ABNORMAL HIGH (ref 70–99)
Potassium: 4.3 mmol/L (ref 3.5–5.3)
Sodium: 141 mmol/L (ref 135–146)
TOTAL PROTEIN: 6.3 g/dL (ref 6.1–8.1)
Total Bilirubin: 0.4 mg/dL (ref 0.2–1.2)

## 2015-06-23 NOTE — Telephone Encounter (Signed)
Orders, notes and demographics faxed to [facility] department

## 2015-06-24 ENCOUNTER — Ambulatory Visit (INDEPENDENT_AMBULATORY_CARE_PROVIDER_SITE_OTHER): Payer: Medicare Other | Admitting: Family Medicine

## 2015-06-24 DIAGNOSIS — R601 Generalized edema: Secondary | ICD-10-CM

## 2015-06-24 DIAGNOSIS — S81812A Laceration without foreign body, left lower leg, initial encounter: Secondary | ICD-10-CM

## 2015-06-24 NOTE — Progress Notes (Signed)
Subjective:    Patient ID: Jason [name], male    DOB: 1919-12-31, 82 y.o.   MRN: [id number]  HPI  Reports weight gain and shortness of breath. He is unable to stand to be weighed today as he nearly falls off our scale. However recently at the cardiologist office, his weight was 204 lbs which is up significantly from last time he was at his neurologist office. Wt Readings from Last 3 Encounters:  05/27/15 212 lb (96.163 kg)  04/16/15 204 lb 6.4 oz (92.715 kg)  03/04/15 195 lb (88.451 kg)   in addition to the weight gain he reports increasing weakness and shortness of breath. His daughter is concerned that he may be retaining fluid. Over the weekend they went to the hospital where a chest x-ray showed no evidence of pulmonary edema or fluid overload on exam. Today on my exam he has no pitting edema in his legs. He has no JVD. There are no pulmonary crackles.  However he has noticeable abdominal distention is significantly bloated. He denies having a bowel movement recently. He denies any flatus. He denies any nausea or projectile vomiting or symptoms of a bowel obstruction.  At that time, my plan was:  I see no evidence of fluid overload or pulmonary edema. What is noticeable as his abdominal distention and his abdominal bloating. I believe the patient may be severely constipated. Furthermore the abdominal bloating could be causing a mechanical effect on the lungs causing him to be more short of breath. He denies any chest pain or cough or pleurisy or hemoptysis. Oxygen saturation is normal today at 98% on room air. I will give the patient Movantik 25 mg poqday for narcotic induced constipation as he is on hydrocodone for his. arm pain. I will also send him for 2 views of the abdomen x-ray along with a chest x-ray. We may need to evaluate for abdominal obstruction.  I will await the results of the x-rays  06/22/15 Patient is referred in from his assisted living facility due to frequent falls and  deconditioning.. He appears very bloated. He has marked abdominal distention. There are large varicosities streaking around his umbilicus. There is a palpable fluid wave on his abdomen. He has +2 pitting edema in both legs distal to the abdomen. He reports shortness of breath however his lungs are clear to auscultation bilaterally. I believe that his shortness of breath is secondary to compression due to the abdominal distention and what I suspect is anasarca and ascites. The patient fell in a parking lot and suffered a large skin tear to his upper left shin. The area is approximately 4 cm x 12 cm. Using a pair of scissors I bluntly dissected the avascular skin from the skin tear. I then covered the wound with 3 pieces of petroleum gauze and applied a pressure dressing wrapped in Coban and. The wound was rinsed vigorously with normal saline prior to the dressing.  At that time, my plan was: I want the patient to obtain a CT scan of the abdomen and pelvis as soon as possible. I am concerned that he has anasarca and ascites and I suspect some type of mass in or around the liver that could be compressing the inferior vena cava. I believe that the abdominal distention is causing his shortness of breath by compressing on his lungs. Prognosis is very guarded. Hemostasis achieved with a pressure dressing. Recheck the patient in 48 hours to review the CT findings and to determine a  plan of care. If there is cancer seen on the CT scan as I expect, I would recommend hospice and comfort care. Hopefully I will be able to switch the patient to a Tegaderm more comfortable dressing in 48 hours regarding the skin tear on his left leg.  06/24/15 Thankfully, CT revealed only anasarca in the abdominal wall, but no malignancy or obstruction.  Labs revealed no kidney failure or hepatitis.  He continues to have diffuse anasarca with +2 pitting edema to his knees as well as a swollen distended abdomen. He continues to have weeping  fluid coming from the wounds on his legs.   Past Medical History  Diagnosis Date  . PVD (peripheral vascular disease) (HCC)   . CAD (coronary artery disease)     a. 12/2005 - PCI to OM  . Hyperlipidemia   . Cerebrovascular accident (HCC)   . DVT femoral (deep venous thrombosis) with thrombophlebitis (HCC) 03/2007    Jason [name] 07/13/2010  . GERD (gastroesophageal reflux disease)   . Edema   . Vertigo   . IBS (irritable bowel syndrome)   . Duodenitis   . Gastritis   . Esophageal stricture   . Cellulitis   . Pulmonary embolism (HCC)   . Prostatic hypertrophy   . Frequent falls   . Subdural hematoma (HCC)   . PTSD (post-traumatic stress disorder)   . Vertigo   . TIA (transient ischemic attack)     a. 01/2006 and 05/2006.   [name] Hiatal hernia     periferal neuropathy  . Hx of echocardiogram     Echo 9/13:  Mild LVH, EF 60-65%, Gr 1 diast dysfn, mild AI, mild BAE  . Gout   . Chronic low back pain   . Gait disorder   . Peripheral neuropathy (HCC)   . Hydrocele   . History of shingles     Left lumbar  . HOH (hard of hearing)     hearing aid, totally in R ear ( no hearing aid in L )   . Myocardial infarction [name]    "dr said I'd had a small one"  . Daily headache   . Depression   . Urinary frequency   . Blind left eye   . Hypertension   . Dysrhythmia   . Arthritis   . Anxiety     PTSD  . Hemiparesis and alteration of sensations as late effects of stroke (HCC) 03/04/2015   Past Surgical History  Procedure Laterality Date  . Hand surgery Right     "replaced bone in my finger"  . Angioplasty    . Carotid endarterectomy Left   . Coronary angioplasty with stent placement      left circumflex  . Cataract extraction, bilateral    . Tonsillectomy    . Tympanic membrane repair Right 1978    Jason [name] 07/27/2010; "couldn'getting so t couldn't hear; had noise in my ear; didn't correct either  . Hand contracture release Right     palm/notes 07/27/2010  .  Esophagogastroduodenoscopy (egd) with esophageal dilation    . Insertion of vena cava filter  2013    h/o PE  . Endarterectomy Left 11/02/2014    Procedure: LEFT  CAROTID ARTERY ENDARTERECTOMY  WITH DACRON PATCH ANGIOPLASTY;  Surgeon: Sherren Kerns, MD;  Location: [facility] OR;  Service: Vascular;  Laterality: Left;   Current Outpatient Prescriptions on File Prior to Visit  Medication Sig Dispense Refill  . acetaminophen (TYLENOL) 500 MG tablet Take 1,000 mg by  mouth 2 (two) times daily. Take every day per [facility]    . albuterol (PROVENTIL HFA;VENTOLIN HFA) 108 (90 Base) MCG/ACT inhaler Inhale 1-2 puffs into the lungs every 6 (six) hours as needed for wheezing or shortness of breath. 1 Inhaler 0  . ALPRAZolam (XANAX) 0.25 MG tablet Take 1 tablet (0.25 mg total) by mouth 2 (two) times daily as needed for anxiety. 60 tablet 3  . alum & mag hydroxide-simeth (MAALOX/MYLANTA) 200-200-20 MG/5ML suspension Take 30 mLs by mouth every 6 (six) hours as needed for indigestion or heartburn.    [name] aspirin EC 81 MG EC tablet Take 1 tablet (81 mg total) by mouth daily.    . benzonatate (TESSALON) 100 MG capsule Take 1 capsule (100 mg total) by mouth every 8 (eight) hours. 21 capsule 0  . DEXILANT 60 MG capsule TAKE ONE CAPSULE BY MOUTH EVERY DAY 30 capsule 11  . dextromethorphan-guaiFENesin (ROBITUSSIN COLD COUGH+ CHEST) 10-100 MG/5ML liquid Take 10 mLs by mouth every 4 (four) hours as needed for cough.    . docusate sodium (COLACE) 100 MG capsule Take 1 capsule (100 mg total) by mouth at bedtime. 30 capsule 11  . doxazosin (CARDURA) 4 MG tablet Take 1 tablet (4 mg total) by mouth daily. 30 tablet 3  . DULoxetine (CYMBALTA) 30 MG capsule TAKE ONE CAPSULE BY MOUTH EVERY DAY 30 capsule 6  . famotidine (PEPCID) 20 MG tablet Take 1 tablet (20 mg total) by mouth 2 (two) times daily. 60 tablet 0  . finasteride (PROSCAR) 5 MG tablet Take 1 tablet (5 mg total) by mouth daily. 30 tablet 0  . furosemide (LASIX) 40 MG tablet TAKE  1 TABLET(40 MG) BY MOUTH DAILY 90 tablet 0  . guaiFENesin 200 MG tablet Take 1 tablet (200 mg total) by mouth every 4 (four) hours as needed for cough or to loosen phlegm. 30 tablet 0  . hydrALAZINE (APRESOLINE) 50 MG tablet Take 1 tablet (50 mg total) by mouth 3 (three) times daily. 90 tablet 0  . HYDROcodone-acetaminophen (NORCO/VICODIN) 5-325 MG tablet Take 1 tablet by mouth every 6 (six) hours as needed for moderate pain. 60 tablet 0  . ipratropium (ATROVENT) 0.06 % nasal spray Place 2 sprays into both nostrils 4 (four) times daily. 15 mL 0  . lactulose (CHRONULAC) 10 GM/15ML solution Take 10mL PO QD 240 mL 0  . latanoprost (XALATAN) 0.005 % ophthalmic solution Place 1 drop into both eyes at bedtime.    [name] loperamide (IMODIUM) 2 MG capsule Take 2 mg by mouth as needed for diarrhea or loose stools (Not to exceed 8 doses in 24 hours).    . magnesium hydroxide (MILK OF MAGNESIA) 400 MG/5ML suspension Take 30 mLs by mouth at bedtime as needed for mild constipation.    . meclizine (ANTIVERT) 12.5 MG tablet Take 12.5 mg by mouth every 12 (twelve) hours as needed for dizziness.    [name] MYRBETRIQ 25 MG TB24 tablet TAKE 1 TABLET BY MOUTH EVERY DAY 30 tablet 11  . nebivolol (BYSTOLIC) 2.5 MG tablet Take 1 tablet (2.5 mg total) by mouth daily. 30 tablet 0  . nitroGLYCERIN (NITROSTAT) 0.4 MG SL tablet Place 0.4 mg under the tongue every 5 (five) minutes as needed. Chest pain    . NON FORMULARY Place 2 L into the nose at bedtime.    . ondansetron (ZOFRAN) 4 MG tablet Take 4 mg by mouth every 8 (eight) hours as needed for nausea.    . polyethylene glycol powder (GLYCOLAX/MIRALAX)  powder MIX 17 GRAMS WITH 4 TO 6 OUNCES OF WATER AND DRINK ONCE DAILY AS NEEDED FOR CONSTIPATION 527 g 2  . polyvinyl alcohol (LIQUIFILM TEARS) 1.4 % ophthalmic solution Place 2 drops into both eyes 3 (three) times daily.    . predniSONE (DELTASONE) 10 MG tablet Take 1 tablet (10 mg total) by mouth daily with breakfast. 30 tablet 5  .  senna-docusate (SENOKOT-S) 8.6-50 MG tablet Take 1 tablet by mouth at bedtime as needed for mild constipation. 30 tablet 0  . simvastatin (ZOCOR) 40 MG tablet Take 1 tablet (40 mg total) by mouth daily at 6 PM.    . XARELTO 15 MG TABS tablet TAKE 1 TABLET BY MOUTH EVERY DAY WITH SUPPER 30 tablet 6   No current facility-administered medications on file prior to visit.   Allergies  Allergen Reactions  . Ativan [Lorazepam] Other (See Comments)    unknown  . Augmentin [Amoxicillin-Pot Clavulanate] Nausea And Vomiting    daughter states he can tolerate Amoxicillin ok Profuse vomiting. Unable to answer penicillin questions as pt is from Nursing home and unable to provide answers to questions  . Belladonna Alkaloids     Unknown- on MAR  . Corticosteroids     Unknown- on MAR  . Fludrocortisone Acetate Other (See Comments)    "leg swelling"  . Parabens     Unknown- on MAR  . Scopace [Scopolamine] Other (See Comments)    *patch "Made him crazy" per patients daughter    Social History   Social History  . Marital Status: Married    Spouse Name: N/A  . Number of Children: 2  . Years of Education: HS   Occupational History  . retired    Social History Main Topics  . Smoking status: Never Smoker   . Smokeless tobacco: Never Used  . Alcohol Use: 0.0 oz/week    0 Standard drinks or equivalent per week     Comment: wine occasionally  . Drug Use: No  . Sexual Activity: No   Other Topics Concern  . 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   Patient drinks about 2 cups of caffeine daily.           Review of Systems  All other systems reviewed and are negative.      Objective:   Physical Exam  Constitutional: He appears well-developed and well-nourished.  Neck: Neck supple.  Cardiovascular: Normal rate and regular rhythm.   Murmur heard. Pulmonary/Chest: Effort normal and breath  sounds normal. No respiratory distress. He has no wheezes. He has no rales.  Abdominal: Soft. He exhibits distension. He exhibits no fluid wave, no ascites and no mass. Bowel sounds are decreased. There is no tenderness. There is no rebound.  Musculoskeletal: He exhibits no edema.  Vitals reviewed.         Assessment & Plan:  Skin tear, anasarca. Skin tear was covered with 2 Tegaderms. The left leg was then placed in an [place] Fasona boot. The right leg was placed in an [place] Fasona boot. Lasix was increased to 80 mg twice daily with K-Dur 20 milligrams once by mouth daily. Recheck on Monday and decrease diuretics at that point. Hopefully once we get the anasarca down, we can discontinue Unna boots and rely on Tegaderm until skin tear heals.

## 2015-06-25 DIAGNOSIS — R0602 Shortness of breath: Secondary | ICD-10-CM | POA: Diagnosis not present

## 2015-06-25 DIAGNOSIS — S81812A Laceration without foreign body, left lower leg, initial encounter: Secondary | ICD-10-CM | POA: Diagnosis not present

## 2015-06-25 DIAGNOSIS — I509 Heart failure, unspecified: Secondary | ICD-10-CM | POA: Diagnosis not present

## 2015-06-28 ENCOUNTER — Ambulatory Visit (INDEPENDENT_AMBULATORY_CARE_PROVIDER_SITE_OTHER): Payer: Medicare Other | Admitting: Family Medicine

## 2015-06-28 VITALS — BP 120/68 | HR 78 | Temp 97.5°F | Resp 12 | Ht 70.0 in | Wt 208.0 lb

## 2015-06-28 DIAGNOSIS — S81812D Laceration without foreign body, left lower leg, subsequent encounter: Secondary | ICD-10-CM | POA: Diagnosis not present

## 2015-06-28 DIAGNOSIS — R601 Generalized edema: Secondary | ICD-10-CM

## 2015-06-28 NOTE — Progress Notes (Signed)
Subjective:    Patient ID: Jason [name], male    DOB: 1919-12-31, 82 y.o.   MRN: [id number]  HPI  Reports weight gain and shortness of breath. He is unable to stand to be weighed today as he nearly falls off our scale. However recently at the cardiologist office, his weight was 204 lbs which is up significantly from last time he was at his neurologist office. Wt Readings from Last 3 Encounters:  05/27/15 212 lb (96.163 kg)  04/16/15 204 lb 6.4 oz (92.715 kg)  03/04/15 195 lb (88.451 kg)   in addition to the weight gain he reports increasing weakness and shortness of breath. His daughter is concerned that he may be retaining fluid. Over the weekend they went to the hospital where a chest x-ray showed no evidence of pulmonary edema or fluid overload on exam. Today on my exam he has no pitting edema in his legs. He has no JVD. There are no pulmonary crackles.  However he has noticeable abdominal distention is significantly bloated. He denies having a bowel movement recently. He denies any flatus. He denies any nausea or projectile vomiting or symptoms of a bowel obstruction.  At that time, my plan was:  I see no evidence of fluid overload or pulmonary edema. What is noticeable as his abdominal distention and his abdominal bloating. I believe the patient may be severely constipated. Furthermore the abdominal bloating could be causing a mechanical effect on the lungs causing him to be more short of breath. He denies any chest pain or cough or pleurisy or hemoptysis. Oxygen saturation is normal today at 98% on room air. I will give the patient Movantik 25 mg poqday for narcotic induced constipation as he is on hydrocodone for his. arm pain. I will also send him for 2 views of the abdomen x-ray along with a chest x-ray. We may need to evaluate for abdominal obstruction.  I will await the results of the x-rays  06/22/15 Patient is referred in from his assisted living facility due to frequent falls and  deconditioning.. He appears very bloated. He has marked abdominal distention. There are large varicosities streaking around his umbilicus. There is a palpable fluid wave on his abdomen. He has +2 pitting edema in both legs distal to the abdomen. He reports shortness of breath however his lungs are clear to auscultation bilaterally. I believe that his shortness of breath is secondary to compression due to the abdominal distention and what I suspect is anasarca and ascites. The patient fell in a parking lot and suffered a large skin tear to his upper left shin. The area is approximately 4 cm x 12 cm. Using a pair of scissors I bluntly dissected the avascular skin from the skin tear. I then covered the wound with 3 pieces of petroleum gauze and applied a pressure dressing wrapped in Coban and. The wound was rinsed vigorously with normal saline prior to the dressing.  At that time, my plan was: I want the patient to obtain a CT scan of the abdomen and pelvis as soon as possible. I am concerned that he has anasarca and ascites and I suspect some type of mass in or around the liver that could be compressing the inferior vena cava. I believe that the abdominal distention is causing his shortness of breath by compressing on his lungs. Prognosis is very guarded. Hemostasis achieved with a pressure dressing. Recheck the patient in 48 hours to review the CT findings and to determine a  plan of care. If there is cancer seen on the CT scan as I expect, I would recommend hospice and comfort care. Hopefully I will be able to switch the patient to a Tegaderm more comfortable dressing in 48 hours regarding the skin tear on his left leg.  06/24/15 Thankfully, CT revealed only anasarca in the abdominal wall, but no malignancy or obstruction.  Labs revealed no kidney failure or hepatitis.  He continues to have diffuse anasarca with +2 pitting edema to his knees as well as a swollen distended abdomen. He continues to have weeping  fluid coming from the wounds on his legs. At that time, my plan was: Skin tear, anasarca. Skin tear was covered with 2 Tegaderms. The left leg was then placed in an [place] boot. The right leg was placed in an [place] boot. Lasix was increased to 80 mg twice daily with K-Dur 20 milligrams once by mouth daily. Recheck on Monday and decrease diuretics at that point. Hopefully once we get the anasarca down, we can discontinue Unna boots and rely on Tegaderm until skin tear heals.  06/28/14 The patient's weight has decreased to 208 pounds. He has lost approximately 7 pounds on the fluid pills. However he continues to demonstrate abdominal distention. He is also not yet back to his dry weight. The swelling in his legs has completely resolved with Unna boots. The Unna boots are removed today along with a Tegaderms revealing a healthy skin tear with no evidence of cellulitis.  Past Medical History  Diagnosis Date  . PVD (peripheral vascular disease) (HCC)   . CAD (coronary artery disease)     a. 12/2005 - PCI to OM  . Hyperlipidemia   . Cerebrovascular accident (HCC)   . 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 GERD (gastroesophageal reflux disease)   . Edema   . Vertigo   . IBS (irritable bowel syndrome)   . Duodenitis   . Gastritis   . Esophageal stricture   . Cellulitis   . Pulmonary embolism (HCC)   . Prostatic hypertrophy   . Frequent falls   . Subdural hematoma (HCC)   . PTSD (post-traumatic stress disorder)   . Vertigo   . TIA (transient ischemic attack)     a. 01/2006 and 05/2006.   [name] Hiatal hernia     periferal neuropathy  . Hx of echocardiogram     Echo 9/13:  Mild LVH, EF 60-65%, Gr 1 diast dysfn, mild AI, mild BAE  . Gout   . Chronic low back pain   . Gait disorder   . Peripheral neuropathy (HCC)   . Hydrocele   . History of shingles     Left lumbar  . HOH (hard of hearing)     hearing aid, totally in R ear ( no hearing aid  in L )   . Myocardial infarction [facility]) "years ago"    "dr said I'd had a small one"  . Daily headache   . Depression   . Urinary frequency   . Blind left eye   . Hypertension   . Dysrhythmia   . Arthritis   . Anxiety     PTSD  . Hemiparesis and alteration of sensations as late effects of stroke (HCC) 03/04/2015   Past Surgical History  Procedure Laterality Date  . Hand surgery Right     "replaced bone in my finger"  . Angioplasty    . Carotid endarterectomy Left   .  Coronary angioplasty with stent placement      left circumflex  . Cataract extraction, bilateral    . Tonsillectomy    . Tympanic membrane repair Right 1978    [name] 07/27/2010; "couldn'getting so t couldn't hear; had noise in my ear; didn't correct either  . Hand contracture release Right     palm/notes 07/27/2010  . Esophagogastroduodenoscopy (egd) with esophageal dilation    . Insertion of vena cava filter  2013    h/o PE  . Endarterectomy Left 11/02/2014    Procedure: LEFT  CAROTID ARTERY ENDARTERECTOMY  WITH DACRON PATCH ANGIOPLASTY;  Surgeon: Sherren Kerns, MD;  Location: [facility] OR;  Service: Vascular;  Laterality: Left;   Current Outpatient Prescriptions on File Prior to Visit  Medication Sig Dispense Refill  . acetaminophen (TYLENOL) 500 MG tablet Take 1,000 mg by mouth 2 (two) times daily. Take every day per [facility]    . albuterol (PROVENTIL HFA;VENTOLIN HFA) 108 (90 Base) MCG/ACT inhaler Inhale 1-2 puffs into the lungs every 6 (six) hours as needed for wheezing or shortness of breath. 1 Inhaler 0  . ALPRAZolam (XANAX) 0.25 MG tablet Take 1 tablet (0.25 mg total) by mouth 2 (two) times daily as needed for anxiety. 60 tablet 3  . alum & mag hydroxide-simeth (MAALOX/MYLANTA) 200-200-20 MG/5ML suspension Take 30 mLs by mouth every 6 (six) hours as needed for indigestion or heartburn.    [name] aspirin EC 81 MG EC tablet Take 1 tablet (81 mg total) by mouth daily.    . benzonatate (TESSALON) 100 MG capsule Take 1 capsule  (100 mg total) by mouth every 8 (eight) hours. 21 capsule 0  . DEXILANT 60 MG capsule TAKE ONE CAPSULE BY MOUTH EVERY DAY 30 capsule 11  . dextromethorphan-guaiFENesin (ROBITUSSIN COLD COUGH+ CHEST) 10-100 MG/5ML liquid Take 10 mLs by mouth every 4 (four) hours as needed for cough.    . docusate sodium (COLACE) 100 MG capsule Take 1 capsule (100 mg total) by mouth at bedtime. 30 capsule 11  . doxazosin (CARDURA) 4 MG tablet Take 1 tablet (4 mg total) by mouth daily. 30 tablet 3  . DULoxetine (CYMBALTA) 30 MG capsule TAKE ONE CAPSULE BY MOUTH EVERY DAY 30 capsule 6  . famotidine (PEPCID) 20 MG tablet Take 1 tablet (20 mg total) by mouth 2 (two) times daily. 60 tablet 0  . finasteride (PROSCAR) 5 MG tablet Take 1 tablet (5 mg total) by mouth daily. 30 tablet 0  . furosemide (LASIX) 40 MG tablet TAKE 1 TABLET(40 MG) BY MOUTH DAILY 90 tablet 0  . guaiFENesin 200 MG tablet Take 1 tablet (200 mg total) by mouth every 4 (four) hours as needed for cough or to loosen phlegm. 30 tablet 0  . hydrALAZINE (APRESOLINE) 50 MG tablet Take 1 tablet (50 mg total) by mouth 3 (three) times daily. 90 tablet 0  . HYDROcodone-acetaminophen (NORCO/VICODIN) 5-325 MG tablet Take 1 tablet by mouth every 6 (six) hours as needed for moderate pain. 60 tablet 0  . ipratropium (ATROVENT) 0.06 % nasal spray Place 2 sprays into both nostrils 4 (four) times daily. 15 mL 0  . lactulose (CHRONULAC) 10 GM/15ML solution Take 10mL PO QD 240 mL 0  . latanoprost (XALATAN) 0.005 % ophthalmic solution Place 1 drop into both eyes at bedtime.    [name] loperamide (IMODIUM) 2 MG capsule Take 2 mg by mouth as needed for diarrhea or loose stools (Not to exceed 8 doses in 24 hours).    . magnesium  hydroxide (MILK OF MAGNESIA) 400 MG/5ML suspension Take 30 mLs by mouth at bedtime as needed for mild constipation.    . meclizine (ANTIVERT) 12.5 MG tablet Take 12.5 mg by mouth every 12 (twelve) hours as needed for dizziness.    [name]. MYRBETRIQ 25 MG TB24  tablet TAKE 1 TABLET BY MOUTH EVERY DAY 30 tablet 11  . nebivolol (BYSTOLIC) 2.5 MG tablet Take 1 tablet (2.5 mg total) by mouth daily. 30 tablet 0  . nitroGLYCERIN (NITROSTAT) 0.4 MG SL tablet Place 0.4 mg under the tongue every 5 (five) minutes as needed. Chest pain    . NON FORMULARY Place 2 L into the nose at bedtime.    . ondansetron (ZOFRAN) 4 MG tablet Take 4 mg by mouth every 8 (eight) hours as needed for nausea.    . polyethylene glycol powder (GLYCOLAX/MIRALAX) powder MIX 17 GRAMS WITH 4 TO 6 OUNCES OF WATER AND DRINK ONCE DAILY AS NEEDED FOR CONSTIPATION 527 g 2  . polyvinyl alcohol (LIQUIFILM TEARS) 1.4 % ophthalmic solution Place 2 drops into both eyes 3 (three) times daily.    . predniSONE (DELTASONE) 10 MG tablet Take 1 tablet (10 mg total) by mouth daily with breakfast. 30 tablet 5  . senna-docusate (SENOKOT-S) 8.6-50 MG tablet Take 1 tablet by mouth at bedtime as needed for mild constipation. 30 tablet 0  . simvastatin (ZOCOR) 40 MG tablet Take 1 tablet (40 mg total) by mouth daily at 6 PM.    . XARELTO 15 MG TABS tablet TAKE 1 TABLET BY MOUTH EVERY DAY WITH SUPPER 30 tablet 6   No current facility-administered medications on file prior to visit.   Allergies  Allergen Reactions  . Ativan [Lorazepam] Other (See Comments)    unknown  . Augmentin [Amoxicillin-Pot Clavulanate] Nausea And Vomiting    daughter states he can tolerate Amoxicillin ok Profuse vomiting. Unable to answer penicillin questions as pt is from Nursing home and unable to provide answers to questions  . Belladonna Alkaloids     Unknown- on MAR  . Corticosteroids     Unknown- on MAR  . Fludrocortisone Acetate Other (See Comments)    "leg swelling"  . Parabens     Unknown- on MAR  . Scopace [Scopolamine] Other (See Comments)    *patch "Made him crazy" per patients daughter    Social History   Social History  . Marital Status: Married    Spouse Name: N/A  . Number of Children: 2  . Years of  Education: HS   Occupational History  . retired    Social History Main Topics  . Smoking status: Never Smoker   . Smokeless tobacco: Never Used  . Alcohol Use: 0.0 oz/week    0 Standard drinks or equivalent per week     Comment: wine occasionally  . Drug Use: No  . Sexual Activity: No   Other Topics Concern  . 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   Patient drinks about 2 cups of caffeine daily.           Review of Systems  All other systems reviewed and are negative.      Objective:   Physical Exam  Constitutional: He appears well-developed and well-nourished.  Neck: Neck supple.  Cardiovascular: Normal rate and regular rhythm.   Murmur heard. Pulmonary/Chest: Effort normal and breath sounds normal. No respiratory distress. He  has no wheezes. He has no rales.  Abdominal: Soft. He exhibits distension. He exhibits no fluid wave, no ascites and no mass. Bowel sounds are decreased. There is no tenderness. There is no rebound.  Musculoskeletal: He exhibits no edema.  Vitals reviewed.         Assessment & Plan:  Skin tear, anasarca Continue Lasix 80 mg eyes daily as we are currently doing a lot of potassium. He is not yet back to his dry weight. I will recheck the patient on Thursday with tentative plans to discontinue the higher dose diuretic at that time along with potassium and resume his previous dose of Lasix which was 40 mg a day. To prevent edema reaccumulation in the legs, I have recommended that he wear compression hose 15-20 mmHg that are knee-high every day. I provided a prescription for this and also a walker for the assisted living facility to apply the compression hose. I applied Tegaderm to the skin tear. I then applied an Unna boot after covering the Tegaderm with a nonadherent gauze. However the Unna boot was applied only from below the knee to the mid shin just to  allow better comfort for the patient. I will recheck the patient on Thursday. If the compression hose are managing his edema, if his weight is closer to his dry weight, my plan will be to tentatively decrease Lasix back to 40 mg a day, discontinue potassium, and have the home health nurse start to apply the Unna boot the way I have applied today along with the Tegaderm every 2-3 days until the wound is healed. I would also tentatively plan to discontinue prednisone as I believe this is contributing to some of his anasarca. Recheck on Thursday

## 2015-06-29 ENCOUNTER — Telehealth: Payer: Self-pay | Admitting: Family Medicine

## 2015-06-29 NOTE — Telephone Encounter (Signed)
Ted hose (socks) are to be applied every morning and removed in the evening.  The wound on his leg is a tegaderm covered in one layer of una boot and coban just at his calf and the Ted hose can hopefully slid over it given they are only 15-20 mm HG.  That dressing is changed every three days and is independent of the ted hose.

## 2015-06-29 NOTE — Telephone Encounter (Signed)
Got order yesterday to apply TED hose daily and remove at bedtime.  How are they suppose to do that when [name] week to wrap legs due to weeping?  Please advise?

## 2015-06-29 NOTE — Telephone Encounter (Signed)
[facility] given provider directions

## 2015-07-01 ENCOUNTER — Ambulatory Visit (INDEPENDENT_AMBULATORY_CARE_PROVIDER_SITE_OTHER): Payer: Medicare Other | Admitting: Family Medicine

## 2015-07-01 ENCOUNTER — Encounter: Payer: Self-pay | Admitting: Family Medicine

## 2015-07-01 VITALS — BP 120/68 | HR 78 | Temp 97.6°F | Resp 14

## 2015-07-01 DIAGNOSIS — R601 Generalized edema: Secondary | ICD-10-CM

## 2015-07-01 DIAGNOSIS — N183 Chronic kidney disease, stage 3 (moderate): Secondary | ICD-10-CM

## 2015-07-01 NOTE — Progress Notes (Signed)
Subjective:    Patient ID: Jason [name], male    DOB: 08-Sep-1919, 82 y.o.   MRN: [id number]  HPI  Reports weight gain and shortness of breath. He is unable to stand to be weighed today as he nearly falls off our scale. However recently at the cardiologist office, his weight was 204 lbs which is up significantly from last time he was at his neurologist office. Wt Readings from Last 3 Encounters:  06/28/15 208 lb (94.348 kg)  05/27/15 212 lb (96.163 kg)  04/16/15 204 lb 6.4 oz (92.715 kg)   in addition to the weight gain he reports increasing weakness and shortness of breath. His daughter is concerned that he may be retaining fluid. Over the weekend they went to the hospital where a chest x-ray showed no evidence of pulmonary edema or fluid overload on exam. Today on my exam he has no pitting edema in his legs. He has no JVD. There are no pulmonary crackles.  However he has noticeable abdominal distention is significantly bloated. He denies having a bowel movement recently. He denies any flatus. He denies any nausea or projectile vomiting or symptoms of a bowel obstruction.  At that time, my plan was:  I see no evidence of fluid overload or pulmonary edema. What is noticeable as his abdominal distention and his abdominal bloating. I believe the patient may be severely constipated. Furthermore the abdominal bloating could be causing a mechanical effect on the lungs causing him to be more short of breath. He denies any chest pain or cough or pleurisy or hemoptysis. Oxygen saturation is normal today at 98% on room air. I will give the patient Movantik 25 mg poqday for narcotic induced constipation as he is on hydrocodone for his. arm pain. I will also send him for 2 views of the abdomen x-ray along with a chest x-ray. We may need to evaluate for abdominal obstruction.  I will await the results of the x-rays  06/22/15 Patient is referred in from his assisted living facility due to frequent falls and  deconditioning.. He appears very bloated. He has marked abdominal distention. There are large varicosities streaking around his umbilicus. There is a palpable fluid wave on his abdomen. He has +2 pitting edema in both legs distal to the abdomen. He reports shortness of breath however his lungs are clear to auscultation bilaterally. I believe that his shortness of breath is secondary to compression due to the abdominal distention and what I suspect is anasarca and ascites. The patient fell in a parking lot and suffered a large skin tear to his upper left shin. The area is approximately 4 cm x 12 cm. Using a pair of scissors I bluntly dissected the avascular skin from the skin tear. I then covered the wound with 3 pieces of petroleum gauze and applied a pressure dressing wrapped in Cob[place] and. The wound was rinsed vigorously with normal saline prior to the dressing.  At that time, my plan was: I want the patient to obtain a CT scan of the abdomen and pelvis as soon as possible. I am concerned that he has anasarca and ascites and I suspect some type of mass in or around the liver that could be compressing the inferior vena cava. I believe that the abdominal distention is causing his shortness of breath by compressing on his lungs. Prognosis is very guarded. Hemostasis achieved with a pressure dressing. Recheck the patient in 48 hours to review the CT findings and to determine a  plan of care. If there is cancer seen on the CT scan as I expect, I would recommend hospice and comfort care. Hopefully I will be able to switch the patient to a Tegaderm more comfortable dressing in 48 hours regarding the skin tear on his left leg.  06/24/15 Thankfully, CT revealed only anasarca in the abdominal wall, but no malignancy or obstruction.  Labs revealed no kidney failure or hepatitis.  He continues to have diffuse anasarca with +2 pitting edema to his knees as well as a swollen distended abdomen. He continues to have weeping  fluid coming from the wounds on his legs. At that time, my plan was: Skin tear, anasarca. Skin tear was covered with 2 Tegaderms. The left leg was then placed in an [place] Fasona boot. The right leg was placed in an [place] Fasona boot. Lasix was increased to 80 mg twice daily with K-Dur 20 milligrams once by mouth daily. Recheck on Monday and decrease diuretics at that point. Hopefully once we get the anasarca down, we can discontinue Unna boots and rely on Tegaderm until skin tear heals.  06/28/14 The patient's weight has decreased to 208 pounds. He has lost approximately 7 pounds on the fluid pills. However he continues to demonstrate abdominal distention. He is also not yet back to his dry weight. The swelling in his legs has completely resolved with Unna boots. The Unna boots are removed today along with a Tegaderms revealing a healthy skin tear with no evidence of cellulitis.  At that time, my plan was: Continue Lasix 80 mg eyes daily as we are currently doing a lot of potassium. He is not yet back to his dry weight. I will recheck the patient on Thursday with tentative plans to discontinue the higher dose diuretic at that time along with potassium and resume his previous dose of Lasix which was 40 mg a day. To prevent edema reaccumulation in the legs, I have recommended that he wear compression hose 15-20 mmHg that are knee-high every day. I provided a prescription for this and also a walker for the assisted living facility to apply the compression hose. I applied Tegaderm to the skin tear. I then applied an Unna boot after covering the Tegaderm with a nonadherent gauze. However the Unna boot was applied only from below the knee to the mid shin just to allow better comfort for the patient. I will recheck the patient on Thursday. If the compression hose are managing his edema, if his weight is closer to his dry weight, my plan will be to tentatively decrease Lasix back to 40 mg a day, discontinue potassium, and have the home  health nurse start to apply the Unna boot the way I have applied today along with the Tegaderm every 2-3 days until the wound is healed. I would also tentatively plan to discontinue prednisone as I believe this is contributing to some of his anasarca. Recheck on Thursday   07/01/15 Wound seems to be healing nicely. Swelling in his legs seem well-controlled with TED hose. Still taking prednisone. Still taking Lasix 80 mg twice daily.  Past Medical History  Diagnosis Date  . PVD (peripheral vascular disease) (HCC)   . CAD (coronary artery disease)     a. 12/2005 - PCI to OM  . Hyperlipidemia   . Cerebrovascular accident (HCC)   . DVT femoral (deep venous thrombosis) with thrombophlebitis (HCC) 03/2007    [name]/notes 07/13/2010  . GERD (gastroesophageal reflux disease)   . Edema   . Vertigo   .  IBS (irritable bowel syndrome)   . Duodenitis   . Gastritis   . Esophageal stricture   . Cellulitis   . Pulmonary embolism (HCC)   . Prostatic hypertrophy   . Frequent falls   . Subdural hematoma (HCC)   . PTSD (post-traumatic stress disorder)   . Vertigo   . TIA (transient ischemic attack)     a. 01/2006 and 05/2006.   [name] Hiatal hernia     periferal neuropathy  . Hx of echocardiogram     Echo 9/13:  Mild LVH, EF 60-65%, Gr 1 diast dysfn, mild AI, mild BAE  . Gout   . Chronic low back pain   . Gait disorder   . Peripheral neuropathy (HCC)   . Hydrocele   . History of shingles     Left lumbar  . HOH (hard of hearing)     hearing aid, totally in R ear ( no hearing aid in L )   . Myocardial infarction [facility]) "years ago"    "dr said I'd had a small one"  . Daily headache   . Depression   . Urinary frequency   . Blind left eye   . Hypertension   . Dysrhythmia   . Arthritis   . Anxiety     PTSD  . Hemiparesis and alteration of sensations as late effects of stroke (HCC) 03/04/2015   Past Surgical History  Procedure Laterality Date  . Hand surgery Right     "replaced bone in my finger"  .  Angioplasty    . Carotid endarterectomy Left   . Coronary angioplasty with stent placement      left circumflex  . Cataract extraction, bilateral    . Tonsillectomy    . Tympanic membrane repair Right 1978    [name] 07/27/2010; "couldn'getting so t couldn't hear; had noise in my ear; didn't correct either  . Hand contracture release Right     palm/notes 07/27/2010  . Esophagogastroduodenoscopy (egd) with esophageal dilation    . Insertion of vena cava filter  2013    h/o PE  . Endarterectomy Left 11/02/2014    Procedure: LEFT  CAROTID ARTERY ENDARTERECTOMY  WITH DACRON PATCH ANGIOPLASTY;  Surgeon: Sherren Kerns, MD;  Location: [facility] OR;  Service: Vascular;  Laterality: Left;   Current Outpatient Prescriptions on File Prior to Visit  Medication Sig Dispense Refill  . acetaminophen (TYLENOL) 500 MG tablet Take 1,000 mg by mouth 2 (two) times daily. Take every day per [facility]    . albuterol (PROVENTIL HFA;VENTOLIN HFA) 108 (90 Base) MCG/ACT inhaler Inhale 1-2 puffs into the lungs every 6 (six) hours as needed for wheezing or shortness of breath. 1 Inhaler 0  . ALPRAZolam (XANAX) 0.25 MG tablet Take 1 tablet (0.25 mg total) by mouth 2 (two) times daily as needed for anxiety. 60 tablet 3  . alum & mag hydroxide-simeth (MAALOX/MYLANTA) 200-200-20 MG/5ML suspension Take 30 mLs by mouth every 6 (six) hours as needed for indigestion or heartburn.    [name] aspirin EC 81 MG EC tablet Take 1 tablet (81 mg total) by mouth daily.    . benzonatate (TESSALON) 100 MG capsule Take 1 capsule (100 mg total) by mouth every 8 (eight) hours. 21 capsule 0  . DEXILANT 60 MG capsule TAKE ONE CAPSULE BY MOUTH EVERY DAY 30 capsule 11  . dextromethorphan-guaiFENesin (ROBITUSSIN COLD COUGH+ CHEST) 10-100 MG/5ML liquid Take 10 mLs by mouth every 4 (four) hours as needed for cough.    . docusate  sodium (COLACE) 100 MG capsule Take 1 capsule (100 mg total) by mouth at bedtime. 30 capsule 11  . doxazosin (CARDURA) 4 MG tablet Take 1  tablet (4 mg total) by mouth daily. 30 tablet 3  . DULoxetine (CYMBALTA) 30 MG capsule TAKE ONE CAPSULE BY MOUTH EVERY DAY 30 capsule 6  . famotidine (PEPCID) 20 MG tablet Take 1 tablet (20 mg total) by mouth 2 (two) times daily. 60 tablet 0  . finasteride (PROSCAR) 5 MG tablet Take 1 tablet (5 mg total) by mouth daily. 30 tablet 0  . furosemide (LASIX) 40 MG tablet TAKE 1 TABLET(40 MG) BY MOUTH DAILY (Patient taking differently: Take 80 mg by mouth 2 (two) times daily. TAKE 1 TABLET(40 MG) BY MOUTH DAILY) 90 tablet 0  . guaiFENesin 200 MG tablet Take 1 tablet (200 mg total) by mouth every 4 (four) hours as needed for cough or to loosen phlegm. 30 tablet 0  . hydrALAZINE (APRESOLINE) 50 MG tablet Take 1 tablet (50 mg total) by mouth 3 (three) times daily. 90 tablet 0  . HYDROcodone-acetaminophen (NORCO/VICODIN) 5-325 MG tablet Take 1 tablet by mouth every 6 (six) hours as needed for moderate pain. 60 tablet 0  . ipratropium (ATROVENT) 0.06 % nasal spray Place 2 sprays into both nostrils 4 (four) times daily. 15 mL 0  . lactulose (CHRONULAC) 10 GM/15ML solution Take 10mL PO QD 240 mL 0  . latanoprost (XALATAN) 0.005 % ophthalmic solution Place 1 drop into both eyes at bedtime.    [name] loperamide (IMODIUM) 2 MG capsule Take 2 mg by mouth as needed for diarrhea or loose stools (Not to exceed 8 doses in 24 hours).    . magnesium hydroxide (MILK OF MAGNESIA) 400 MG/5ML suspension Take 30 mLs by mouth at bedtime as needed for mild constipation.    . meclizine (ANTIVERT) 12.5 MG tablet Take 12.5 mg by mouth every 12 (twelve) hours as needed for dizziness.    [name] MYRBETRIQ 25 MG TB24 tablet TAKE 1 TABLET BY MOUTH EVERY DAY 30 tablet 11  . nebivolol (BYSTOLIC) 2.5 MG tablet Take 1 tablet (2.5 mg total) by mouth daily. 30 tablet 0  . nitroGLYCERIN (NITROSTAT) 0.4 MG SL tablet Place 0.4 mg under the tongue every 5 (five) minutes as needed. Chest pain    . NON FORMULARY Place 2 L into the nose at bedtime.    .  ondansetron (ZOFRAN) 4 MG tablet Take 4 mg by mouth every 8 (eight) hours as needed for nausea.    . polyethylene glycol powder (GLYCOLAX/MIRALAX) powder MIX 17 GRAMS WITH 4 TO 6 OUNCES OF WATER AND DRINK ONCE DAILY AS NEEDED FOR CONSTIPATION 527 g 2  . polyvinyl alcohol (LIQUIFILM TEARS) 1.4 % ophthalmic solution Place 2 drops into both eyes 3 (three) times daily.    . predniSONE (DELTASONE) 10 MG tablet Take 1 tablet (10 mg total) by mouth daily with breakfast. 30 tablet 5  . senna-docusate (SENOKOT-S) 8.6-50 MG tablet Take 1 tablet by mouth at bedtime as needed for mild constipation. 30 tablet 0  . simvastatin (ZOCOR) 40 MG tablet Take 1 tablet (40 mg total) by mouth daily at 6 PM.    . XARELTO 15 MG TABS tablet TAKE 1 TABLET BY MOUTH EVERY DAY WITH SUPPER 30 tablet 6   No current facility-administered medications on file prior to visit.   Allergies  Allergen Reactions  . Ativan [Lorazepam] Other (See Comments)    unknown  . Augmentin [Amoxicillin-Pot Clavulanate] Nausea And  Vomiting    daughter states he can tolerate Amoxicillin ok Profuse vomiting. Unable to answer penicillin questions as pt is from Nursing home and unable to provide answers to questions  . Belladonna Alkaloids     Unknown- on MAR  . Corticosteroids     Unknown- on MAR  . Fludrocortisone Acetate Other (See Comments)    "leg swelling"  . Parabens     Unknown- on MAR  . Scopace [Scopolamine] Other (See Comments)    *patch "Made him crazy" per patients daughter    Social History   Social History  . Marital Status: Married    Spouse Name: N/A  . Number of Children: 2  . Years of Education: HS   Occupational History  . retired    Social History Main Topics  . Smoking status: Never Smoker   . Smokeless tobacco: Never Used  . Alcohol Use: 0.0 oz/week    0 Standard drinks or equivalent per week     Comment: wine occasionally  . Drug Use: No  . Sexual Activity: No   Other Topics Concern  . 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   Patient drinks about 2 cups of caffeine daily.           Review of Systems  All other systems reviewed and are negative.      Objective:   Physical Exam  Constitutional: He appears well-developed and well-nourished.  Neck: Neck supple.  Cardiovascular: Normal rate and regular rhythm.   Murmur heard. Pulmonary/Chest: Effort normal and breath sounds normal. No respiratory distress. He has no wheezes. He has no rales.  Abdominal: Soft. He exhibits distension. He exhibits no fluid wave, no ascites and no mass. Bowel sounds are decreased. There is no tenderness. There is no rebound.  Musculoskeletal: He exhibits no edema.  Vitals reviewed.         Assessment & Plan:  Anasarca - Plan: BASIC METABOLIC PANEL WITH GFR  CKD (chronic kidney disease), stage 3 (moderate) - Plan: BASIC METABOLIC PANEL WITH GFR  Anasarca has seemed to resolve. Decrease Lasix to 40 mg by mouth twice a day which I believe will be the new maintenance dose. Decrease K Dur to 10 mEq by mouth daily. Check renal function and potassium today. Maintain this dose indefinitely unless kidney failure or dehydration necessitates a change. Apply Tegaderm every 2-3 days or as needed until skin tear heals. Apply TED hose every morning and remove every evening. Discontinue prednisone as this may be contributing to some of his anasarca. Recheck in 2 weeks if no better or sooner if worse

## 2015-07-02 ENCOUNTER — Other Ambulatory Visit: Payer: Self-pay | Admitting: Family Medicine

## 2015-07-02 DIAGNOSIS — R944 Abnormal results of kidney function studies: Secondary | ICD-10-CM

## 2015-07-02 LAB — BASIC METABOLIC PANEL WITH GFR
BUN: 44 mg/dL — ABNORMAL HIGH (ref 7–25)
CALCIUM: 8.5 mg/dL — AB (ref 8.6–10.3)
CO2: 30 mmol/L (ref 20–31)
CREATININE: 2.25 mg/dL — AB (ref 0.70–1.11)
Chloride: 102 mmol/L (ref 98–110)
GFR, EST AFRICAN AMERICAN: 28 mL/min — AB (ref >=60)
GFR, EST NON AFRICAN AMERICAN: 24 mL/min — AB (ref >=60)
Glucose, Bld: 118 mg/dL — ABNORMAL HIGH (ref 70–99)
Potassium: 4.4 mmol/L (ref 3.5–5.3)
SODIUM: 136 mmol/L (ref 135–146)

## 2015-07-03 DIAGNOSIS — S81802D Unspecified open wound, left lower leg, subsequent encounter: Secondary | ICD-10-CM | POA: Diagnosis not present

## 2015-07-03 DIAGNOSIS — I5031 Acute diastolic (congestive) heart failure: Secondary | ICD-10-CM | POA: Diagnosis not present

## 2015-07-03 DIAGNOSIS — I130 Hypertensive heart and chronic kidney disease with heart failure and stage 1 through stage 4 chronic kidney disease, or unspecified chronic kidney disease: Secondary | ICD-10-CM | POA: Diagnosis not present

## 2015-07-03 DIAGNOSIS — I69398 Other sequelae of cerebral infarction: Secondary | ICD-10-CM | POA: Diagnosis not present

## 2015-07-03 DIAGNOSIS — N183 Chronic kidney disease, stage 3 (moderate): Secondary | ICD-10-CM | POA: Diagnosis not present

## 2015-07-03 DIAGNOSIS — I2510 Atherosclerotic heart disease of native coronary artery without angina pectoris: Secondary | ICD-10-CM | POA: Diagnosis not present

## 2015-07-03 DIAGNOSIS — H5442 Blindness, left eye, normal vision right eye: Secondary | ICD-10-CM | POA: Diagnosis not present

## 2015-07-03 DIAGNOSIS — I739 Peripheral vascular disease, unspecified: Secondary | ICD-10-CM | POA: Diagnosis not present

## 2015-07-03 DIAGNOSIS — I951 Orthostatic hypotension: Secondary | ICD-10-CM | POA: Diagnosis not present

## 2015-07-03 DIAGNOSIS — E785 Hyperlipidemia, unspecified: Secondary | ICD-10-CM | POA: Diagnosis not present

## 2015-07-03 DIAGNOSIS — I480 Paroxysmal atrial fibrillation: Secondary | ICD-10-CM | POA: Diagnosis not present

## 2015-07-04 ENCOUNTER — Encounter (HOSPITAL_COMMUNITY): Payer: Self-pay | Admitting: Emergency Medicine

## 2015-07-04 ENCOUNTER — Observation Stay (HOSPITAL_COMMUNITY)
Admission: EM | Admit: 2015-07-04 | Discharge: 2015-07-07 | Disposition: A | Discharge status: Skilled Nursing Facility | Payer: Medicare Other | Attending: Family Medicine | Admitting: Family Medicine

## 2015-07-04 ENCOUNTER — Emergency Department (HOSPITAL_COMMUNITY): Payer: Medicare Other

## 2015-07-04 DIAGNOSIS — I252 Old myocardial infarction: Secondary | ICD-10-CM | POA: Diagnosis not present

## 2015-07-04 DIAGNOSIS — W050XXA Fall from non-moving wheelchair, initial encounter: Secondary | ICD-10-CM | POA: Diagnosis not present

## 2015-07-04 DIAGNOSIS — I517 Cardiomegaly: Secondary | ICD-10-CM | POA: Diagnosis not present

## 2015-07-04 DIAGNOSIS — E785 Hyperlipidemia, unspecified: Secondary | ICD-10-CM | POA: Diagnosis not present

## 2015-07-04 DIAGNOSIS — I69359 Hemiplegia and hemiparesis following cerebral infarction affecting unspecified side: Secondary | ICD-10-CM | POA: Diagnosis not present

## 2015-07-04 DIAGNOSIS — I130 Hypertensive heart and chronic kidney disease with heart failure and stage 1 through stage 4 chronic kidney disease, or unspecified chronic kidney disease: Secondary | ICD-10-CM | POA: Insufficient documentation

## 2015-07-04 DIAGNOSIS — M1990 Unspecified osteoarthritis, unspecified site: Secondary | ICD-10-CM | POA: Insufficient documentation

## 2015-07-04 DIAGNOSIS — I10 Essential (primary) hypertension: Secondary | ICD-10-CM | POA: Diagnosis present

## 2015-07-04 DIAGNOSIS — F419 Anxiety disorder, unspecified: Secondary | ICD-10-CM | POA: Insufficient documentation

## 2015-07-04 DIAGNOSIS — W19XXXA Unspecified fall, initial encounter: Secondary | ICD-10-CM

## 2015-07-04 DIAGNOSIS — L03116 Cellulitis of left lower limb: Secondary | ICD-10-CM | POA: Diagnosis not present

## 2015-07-04 DIAGNOSIS — N200 Calculus of kidney: Secondary | ICD-10-CM | POA: Insufficient documentation

## 2015-07-04 DIAGNOSIS — I2510 Atherosclerotic heart disease of native coronary artery without angina pectoris: Secondary | ICD-10-CM | POA: Diagnosis not present

## 2015-07-04 DIAGNOSIS — R609 Edema, unspecified: Secondary | ICD-10-CM | POA: Diagnosis present

## 2015-07-04 DIAGNOSIS — N179 Acute kidney failure, unspecified: Secondary | ICD-10-CM | POA: Diagnosis not present

## 2015-07-04 DIAGNOSIS — H9191 Unspecified hearing loss, right ear: Secondary | ICD-10-CM | POA: Diagnosis not present

## 2015-07-04 DIAGNOSIS — R1031 Right lower quadrant pain: Secondary | ICD-10-CM

## 2015-07-04 DIAGNOSIS — K5909 Other constipation: Secondary | ICD-10-CM | POA: Diagnosis not present

## 2015-07-04 DIAGNOSIS — Z86718 Personal history of other venous thrombosis and embolism: Secondary | ICD-10-CM | POA: Insufficient documentation

## 2015-07-04 DIAGNOSIS — N183 Chronic kidney disease, stage 3 unspecified: Secondary | ICD-10-CM | POA: Diagnosis present

## 2015-07-04 DIAGNOSIS — S81812A Laceration without foreign body, left lower leg, initial encounter: Secondary | ICD-10-CM | POA: Insufficient documentation

## 2015-07-04 DIAGNOSIS — Z7982 Long term (current) use of aspirin: Secondary | ICD-10-CM | POA: Diagnosis not present

## 2015-07-04 DIAGNOSIS — Z86711 Personal history of pulmonary embolism: Secondary | ICD-10-CM | POA: Insufficient documentation

## 2015-07-04 DIAGNOSIS — I739 Peripheral vascular disease, unspecified: Secondary | ICD-10-CM | POA: Insufficient documentation

## 2015-07-04 DIAGNOSIS — R600 Localized edema: Secondary | ICD-10-CM | POA: Diagnosis present

## 2015-07-04 DIAGNOSIS — I5032 Chronic diastolic (congestive) heart failure: Secondary | ICD-10-CM | POA: Diagnosis not present

## 2015-07-04 DIAGNOSIS — R319 Hematuria, unspecified: Secondary | ICD-10-CM

## 2015-07-04 DIAGNOSIS — T148 Other injury of unspecified body region: Secondary | ICD-10-CM | POA: Diagnosis not present

## 2015-07-04 DIAGNOSIS — Z79899 Other long term (current) drug therapy: Secondary | ICD-10-CM | POA: Diagnosis not present

## 2015-07-04 DIAGNOSIS — Z955 Presence of coronary angioplasty implant and graft: Secondary | ICD-10-CM | POA: Insufficient documentation

## 2015-07-04 DIAGNOSIS — K219 Gastro-esophageal reflux disease without esophagitis: Secondary | ICD-10-CM | POA: Diagnosis present

## 2015-07-04 DIAGNOSIS — R42 Dizziness and giddiness: Secondary | ICD-10-CM | POA: Diagnosis not present

## 2015-07-04 DIAGNOSIS — R140 Abdominal distension (gaseous): Secondary | ICD-10-CM

## 2015-07-04 DIAGNOSIS — I480 Paroxysmal atrial fibrillation: Secondary | ICD-10-CM | POA: Diagnosis present

## 2015-07-04 DIAGNOSIS — Z7901 Long term (current) use of anticoagulants: Secondary | ICD-10-CM | POA: Insufficient documentation

## 2015-07-04 DIAGNOSIS — R0602 Shortness of breath: Secondary | ICD-10-CM | POA: Diagnosis not present

## 2015-07-04 DIAGNOSIS — Y92129 Unspecified place in nursing home as the place of occurrence of the external cause: Secondary | ICD-10-CM | POA: Diagnosis not present

## 2015-07-04 DIAGNOSIS — S8992XA Unspecified injury of left lower leg, initial encounter: Secondary | ICD-10-CM | POA: Diagnosis not present

## 2015-07-04 DIAGNOSIS — R69 Illness, unspecified: Secondary | ICD-10-CM

## 2015-07-04 LAB — CBC WITH DIFFERENTIAL/PLATELET
BASOS ABS: 0 10*3/uL (ref 0.0–0.1)
Basophils Relative: 0 %
Eosinophils Absolute: 0.4 10*3/uL (ref 0.0–0.7)
Eosinophils Relative: 4 %
HEMATOCRIT: 37.3 % — AB (ref 39.0–52.0)
HEMOGLOBIN: 11.8 g/dL — AB (ref 13.0–17.0)
LYMPHS ABS: 1.4 10*3/uL (ref 0.7–4.0)
LYMPHS PCT: 16 %
MCH: 30.2 pg (ref 26.0–34.0)
MCHC: 31.6 g/dL (ref 30.0–36.0)
MCV: 95.4 fL (ref 78.0–100.0)
Monocytes Absolute: 1.4 10*3/uL — ABNORMAL HIGH (ref 0.1–1.0)
Monocytes Relative: 16 %
NEUTROS ABS: 5.6 10*3/uL (ref 1.7–7.7)
NEUTROS PCT: 64 %
PLATELETS: 202 10*3/uL (ref 150–400)
RBC: 3.91 MIL/uL — AB (ref 4.22–5.81)
RDW: 15.9 % — ABNORMAL HIGH (ref 11.5–15.5)
WBC: 8.8 10*3/uL (ref 4.0–10.5)

## 2015-07-04 LAB — I-STAT CG4 LACTIC ACID, ED: LACTIC ACID, VENOUS: 1.06 mmol/L (ref 0.5–2.0)

## 2015-07-04 LAB — BRAIN NATRIURETIC PEPTIDE: B NATRIURETIC PEPTIDE 5: 353.1 pg/mL — AB (ref 0.0–100.0)

## 2015-07-04 LAB — COMPREHENSIVE METABOLIC PANEL
ALT: 16 U/L — AB (ref 17–63)
AST: 17 U/L (ref 15–41)
Albumin: 2.9 g/dL — ABNORMAL LOW (ref 3.5–5.0)
Alkaline Phosphatase: 49 U/L (ref 38–126)
Anion gap: 11 (ref 5–15)
BUN: 38 mg/dL — ABNORMAL HIGH (ref 6–20)
CHLORIDE: 103 mmol/L (ref 101–111)
CO2: 27 mmol/L (ref 22–32)
CREATININE: 2.18 mg/dL — AB (ref 0.61–1.24)
Calcium: 8.7 mg/dL — ABNORMAL LOW (ref 8.9–10.3)
GFR, EST AFRICAN AMERICAN: 28 mL/min — AB (ref >60)
GFR, EST NON AFRICAN AMERICAN: 24 mL/min — AB (ref >60)
Glucose, Bld: 107 mg/dL — ABNORMAL HIGH (ref 65–99)
POTASSIUM: 3.8 mmol/L (ref 3.5–5.1)
SODIUM: 141 mmol/L (ref 135–145)
Total Bilirubin: 0.8 mg/dL (ref 0.3–1.2)
Total Protein: 6.8 g/dL (ref 6.5–8.1)

## 2015-07-04 LAB — URINALYSIS, ROUTINE W REFLEX MICROSCOPIC
BILIRUBIN URINE: NEGATIVE
Glucose, UA: NEGATIVE mg/dL
KETONES UR: NEGATIVE mg/dL
NITRITE: NEGATIVE
Protein, ur: NEGATIVE mg/dL
Specific Gravity, Urine: 1.013 (ref 1.005–1.030)
pH: 7 (ref 5.0–8.0)

## 2015-07-04 LAB — URINE MICROSCOPIC-ADD ON

## 2015-07-04 LAB — CG4 I-STAT (LACTIC ACID): Lactic Acid, Venous: 1.21 mmol/L (ref 0.5–2.0)

## 2015-07-04 MED ORDER — HYDROCODONE-ACETAMINOPHEN 5-325 MG PO TABS
1.0000 | ORAL_TABLET | Freq: Four times a day (QID) | ORAL | Status: DC | PRN
  Administered 2015-07-04 – 2015-07-05 (×3): 1 via ORAL
  Filled 2015-07-04 (×3): qty 1

## 2015-07-04 MED ORDER — ACETAMINOPHEN 500 MG PO TABS
1000.0000 mg | ORAL_TABLET | Freq: Two times a day (BID) | ORAL | Status: DC
  Administered 2015-07-04 – 2015-07-07 (×7): 1000 mg via ORAL
  Filled 2015-07-04 (×7): qty 2

## 2015-07-04 MED ORDER — LACTULOSE 10 GM/15ML PO SOLN
6.6700 g | Freq: Every day | ORAL | Status: DC
  Administered 2015-07-05: 6.67 g via ORAL
  Filled 2015-07-04: qty 15

## 2015-07-04 MED ORDER — IPRATROPIUM BROMIDE 0.06 % NA SOLN
2.0000 | Freq: Four times a day (QID) | NASAL | Status: DC
  Administered 2015-07-04 – 2015-07-06 (×10): 2 via NASAL
  Filled 2015-07-04: qty 15

## 2015-07-04 MED ORDER — NEBIVOLOL HCL 5 MG PO TABS
2.5000 mg | ORAL_TABLET | Freq: Every day | ORAL | Status: DC
  Administered 2015-07-05 – 2015-07-07 (×3): 2.5 mg via ORAL
  Filled 2015-07-04 (×3): qty 1

## 2015-07-04 MED ORDER — ALPRAZOLAM 0.25 MG PO TABS
0.2500 mg | ORAL_TABLET | Freq: Two times a day (BID) | ORAL | Status: DC | PRN
  Administered 2015-07-04 – 2015-07-06 (×2): 0.25 mg via ORAL
  Filled 2015-07-04 (×2): qty 1

## 2015-07-04 MED ORDER — MAGNESIUM HYDROXIDE 400 MG/5ML PO SUSP: 30.0000 mL | Freq: Every evening | ORAL | Status: DC | PRN

## 2015-07-04 MED ORDER — ONDANSETRON HCL 4 MG PO TABS
4.0000 mg | ORAL_TABLET | Freq: Three times a day (TID) | ORAL | Status: DC | PRN
  Administered 2015-07-05: 4 mg via ORAL
  Filled 2015-07-04: qty 1

## 2015-07-04 MED ORDER — MIRABEGRON ER 25 MG PO TB24
25.0000 mg | ORAL_TABLET | Freq: Every day | ORAL | Status: DC
  Administered 2015-07-05 – 2015-07-07 (×3): 25 mg via ORAL
  Filled 2015-07-04 (×3): qty 1

## 2015-07-04 MED ORDER — ALBUTEROL SULFATE (2.5 MG/3ML) 0.083% IN NEBU: 2.5000 mg | INHALATION_SOLUTION | Freq: Four times a day (QID) | RESPIRATORY_TRACT | Status: DC | PRN

## 2015-07-04 MED ORDER — DOXAZOSIN MESYLATE 4 MG PO TABS
4.0000 mg | ORAL_TABLET | Freq: Every day | ORAL | Status: DC
  Administered 2015-07-05: 4 mg via ORAL
  Filled 2015-07-04: qty 1

## 2015-07-04 MED ORDER — MECLIZINE HCL 12.5 MG PO TABS
12.5000 mg | ORAL_TABLET | Freq: Two times a day (BID) | ORAL | Status: DC | PRN
  Filled 2015-07-04: qty 1

## 2015-07-04 MED ORDER — ACETAMINOPHEN 650 MG RE SUPP: 650.0000 mg | Freq: Four times a day (QID) | RECTAL | Status: DC | PRN

## 2015-07-04 MED ORDER — SIMVASTATIN 40 MG PO TABS
40.0000 mg | ORAL_TABLET | Freq: Every day | ORAL | Status: DC
  Administered 2015-07-04 – 2015-07-06 (×3): 40 mg via ORAL
  Filled 2015-07-04 (×3): qty 1

## 2015-07-04 MED ORDER — DOCUSATE SODIUM 100 MG PO CAPS
100.0000 mg | ORAL_CAPSULE | Freq: Every day | ORAL | Status: DC
  Administered 2015-07-04: 100 mg via ORAL
  Filled 2015-07-04: qty 1

## 2015-07-04 MED ORDER — VANCOMYCIN HCL IN DEXTROSE 1-5 GM/200ML-% IV SOLN
1000.0000 mg | Freq: Once | INTRAVENOUS | Status: AC
  Administered 2015-07-04: 1000 mg via INTRAVENOUS
  Filled 2015-07-04: qty 200

## 2015-07-04 MED ORDER — HYDRALAZINE HCL 50 MG PO TABS
50.0000 mg | ORAL_TABLET | Freq: Three times a day (TID) | ORAL | Status: DC
  Administered 2015-07-04 – 2015-07-05 (×3): 50 mg via ORAL
  Filled 2015-07-04 (×3): qty 1

## 2015-07-04 MED ORDER — LATANOPROST 0.005 % OP SOLN
1.0000 [drp] | Freq: Every day | OPHTHALMIC | Status: DC
  Administered 2015-07-05 – 2015-07-06 (×2): 1 [drp] via OPHTHALMIC
  Filled 2015-07-04 (×2): qty 2.5

## 2015-07-04 MED ORDER — FINASTERIDE 5 MG PO TABS
5.0000 mg | ORAL_TABLET | Freq: Every day | ORAL | Status: DC
  Administered 2015-07-05 – 2015-07-07 (×3): 5 mg via ORAL
  Filled 2015-07-04 (×3): qty 1

## 2015-07-04 MED ORDER — DULOXETINE HCL 30 MG PO CPEP
30.0000 mg | ORAL_CAPSULE | Freq: Every day | ORAL | Status: DC
  Administered 2015-07-05 – 2015-07-07 (×3): 30 mg via ORAL
  Filled 2015-07-04 (×3): qty 1

## 2015-07-04 MED ORDER — ASPIRIN EC 81 MG PO TBEC
81.0000 mg | DELAYED_RELEASE_TABLET | Freq: Every day | ORAL | Status: DC
  Administered 2015-07-05 – 2015-07-07 (×3): 81 mg via ORAL
  Filled 2015-07-04 (×3): qty 1

## 2015-07-04 MED ORDER — ACETAMINOPHEN 325 MG PO TABS: 650.0000 mg | ORAL_TABLET | Freq: Four times a day (QID) | ORAL | Status: DC | PRN

## 2015-07-04 MED ORDER — VANCOMYCIN HCL IN DEXTROSE 750-5 MG/150ML-% IV SOLN
750.0000 mg | INTRAVENOUS | Status: DC
  Filled 2015-07-04: qty 150

## 2015-07-04 MED ORDER — FAMOTIDINE 20 MG PO TABS
20.0000 mg | ORAL_TABLET | Freq: Two times a day (BID) | ORAL | Status: DC
  Administered 2015-07-04 – 2015-07-06 (×4): 20 mg via ORAL
  Filled 2015-07-04 (×4): qty 1

## 2015-07-04 MED ORDER — ALUM & MAG HYDROXIDE-SIMETH 200-200-20 MG/5ML PO SUSP
30.0000 mL | Freq: Four times a day (QID) | ORAL | Status: DC | PRN
  Administered 2015-07-05: 30 mL via ORAL
  Filled 2015-07-04: qty 30

## 2015-07-04 MED ORDER — SODIUM CHLORIDE 0.9 % IV SOLN
1.5000 g | Freq: Two times a day (BID) | INTRAVENOUS | Status: DC
  Administered 2015-07-04 – 2015-07-07 (×6): 1.5 g via INTRAVENOUS
  Filled 2015-07-04 (×7): qty 1.5

## 2015-07-04 MED ORDER — POLYVINYL ALCOHOL 1.4 % OP SOLN
2.0000 [drp] | Freq: Three times a day (TID) | OPHTHALMIC | Status: DC
  Administered 2015-07-04 – 2015-07-07 (×9): 2 [drp] via OPHTHALMIC
  Filled 2015-07-04: qty 15

## 2015-07-04 MED ORDER — RIVAROXABAN 15 MG PO TABS
15.0000 mg | ORAL_TABLET | Freq: Every day | ORAL | Status: DC
  Administered 2015-07-04 – 2015-07-06 (×3): 15 mg via ORAL
  Filled 2015-07-04 (×4): qty 1

## 2015-07-04 MED ORDER — PIPERACILLIN-TAZOBACTAM 3.375 G IVPB 30 MIN
3.3750 g | Freq: Once | INTRAVENOUS | Status: AC
  Administered 2015-07-04: 3.375 g via INTRAVENOUS
  Filled 2015-07-04: qty 50

## 2015-07-04 MED ORDER — POTASSIUM CHLORIDE CRYS ER 10 MEQ PO TBCR
10.0000 meq | EXTENDED_RELEASE_TABLET | Freq: Every day | ORAL | Status: DC
  Administered 2015-07-05 – 2015-07-07 (×3): 10 meq via ORAL
  Filled 2015-07-04 (×3): qty 1

## 2015-07-04 MED ORDER — SENNOSIDES-DOCUSATE SODIUM 8.6-50 MG PO TABS: 1.0000 | ORAL_TABLET | Freq: Every evening | ORAL | Status: DC | PRN

## 2015-07-04 MED ORDER — FUROSEMIDE 40 MG PO TABS
40.0000 mg | ORAL_TABLET | Freq: Two times a day (BID) | ORAL | Status: DC
  Administered 2015-07-04 – 2015-07-05 (×2): 40 mg via ORAL
  Filled 2015-07-04 (×2): qty 1

## 2015-07-04 MED ORDER — PANTOPRAZOLE SODIUM 40 MG PO TBEC
40.0000 mg | DELAYED_RELEASE_TABLET | Freq: Every day | ORAL | Status: DC
  Administered 2015-07-05: 40 mg via ORAL
  Filled 2015-07-04: qty 1

## 2015-07-04 NOTE — ED Notes (Addendum)
GEMS from [facility], skin tear to LLE 3 days ago with apparent infection, A/O X4, denies fever, c/o generalized pain, VSS, NAD, also c/o SOB (hx CHF with bilat leg edema)

## 2015-07-04 NOTE — H&P (Signed)
History and Physical    Jason [name] ZOX:[id number] DOB: [birth year] DOA: 07/04/2015  Referring MD/NP/PA: EDP  PCP: Leo Grosser, MD  Outpatient Specialists: Patient Care Team: Donita Brooks, MD as PCP - General (Family Medicine) Iva Boop, MD as Consulting Physician (Gastroenterology) Tonny Bollman, MD as Consulting Physician (Cardiology) York Spaniel, MD as Consulting Physician (Neurology) Patient coming from: Assisted Living Facility  Chief Complaint: left leg wound  HPI: Jason [name] is a 82 y.o. male with medical history significant for CAD with remote PCI, A-Fib, diastolic heart failure, HTN, PVD, CVA, CKD stage 3, and DVT. Patient brought to ED by EMS for left lower leg wound. He has had several visits to PCP this month for evaluation of this left lower leg wound as well as for shortness of breath / anasarca . The wound was sustained after patient fell out of his wheelchair . PCP has been dressing the wound which was described as being significantly improved without cellulitis on last visit 06/28/15  Also during those follow up visits PCP was evaluating SOB / edema. Weight had increased several pounds from early February. Noncontrast CT scan done to evaluate for  ascites was without acute findings (other than some mild bladder wall thickening). Lasix increased, patient lost 7 pounds but Cr rose and diuretics were decreased.   Assisted Living facility sent patient to ED out of concern for erythema of left lower extremity. Daughter in room and reports that the wound actually looks much better though she does agree that there is more erythema proximal to the left lower extremity wound  ED Course:  BP 117/90 mmHg  Pulse 92  Temp(Src) 98 F (36.7 C) (Rectal)  Resp 23  Ht 5\' 10"  (1.778 m)  Wt 94.257 kg (207 lb 12.8 oz)  BMI 29.82 kg/m2  SpO2 94%   Review of Systems: As per HPI otherwise 10 point review of systems negative.   Past Medical History  Diagnosis Date  .  PVD (peripheral vascular disease) (HCC)   . CAD (coronary artery disease)     a. 12/2005 - PCI to OM  . Hyperlipidemia   . Cerebrovascular accident (HCC)   . 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 GERD (gastroesophageal reflux disease)   . Vertigo   . IBS (irritable bowel syndrome)   . Duodenitis   . Gastritis   . Esophageal stricture   . Cellulitis   . Pulmonary embolism (HCC)   . Prostatic hypertrophy   . Frequent falls   . Subdural hematoma (HCC)   . PTSD (post-traumatic stress disorder)   . Vertigo   . TIA (transient ischemic attack)     a. 01/2006 and 05/2006.   [name] Hiatal hernia     periferal neuropathy  . Hx of echocardiogram     Echo 9/13:  Mild LVH, EF 60-65%, Gr 1 diast dysfn, mild AI, mild BAE  . Gout   . Chronic low back pain   . Gait disorder   . Peripheral neuropathy (HCC)   . Hydrocele   . History of shingles     Left lumbar  . HOH (hard of hearing)     hearing aid, totally in R ear ( no hearing aid in L )   . Myocardial infarction [facility]) "years ago"    "dr said I'd had a small one"  . Daily headache   . Depression   . Urinary frequency   .  Blind left eye   . Hypertension   . Dysrhythmia   . Arthritis   . Anxiety     PTSD  . Hemiparesis and alteration of sensations as late effects of stroke (HCC) 03/04/2015    Past Surgical History  Procedure Laterality Date  . Hand surgery Right     "replaced bone in my finger"  . Angioplasty    . Carotid endarterectomy Left   . Coronary angioplasty with stent placement      left circumflex  . Cataract extraction, bilateral    . Tonsillectomy    . Tympanic membrane repair Right 1978    [name] 07/27/2010; "couldn'getting so t couldn't hear; had noise in my ear; didn't correct either  . Hand contracture release Right     palm/notes 07/27/2010  . Esophagogastroduodenoscopy (egd) with esophageal dilation    . Insertion of vena cava filter  2013    h/o PE  .  Endarterectomy Left 11/02/2014    Procedure: LEFT  CAROTID ARTERY ENDARTERECTOMY  WITH DACRON PATCH ANGIOPLASTY;  Surgeon: Sherren Kerns, MD;  Location: [facility] OR;  Service: Vascular;  Laterality: Left;     reports that he has never smoked. He has never used smokeless tobacco. He reports that he drinks alcohol. He reports that he does not use illicit drugs.  Allergies  Allergen Reactions  . Ativan [Lorazepam] Other (See Comments)    unknown  . Augmentin [Amoxicillin-Pot Clavulanate] Nausea And Vomiting    daughter states he can tolerate Amoxicillin ok Profuse vomiting. Unable to answer penicillin questions as pt is from Nursing home and unable to provide answers to questions  . Belladonna Alkaloids     Unknown- on MAR  . Corticosteroids     Unknown- on MAR  . Fludrocortisone Acetate Other (See Comments)    "leg swelling"  . Parabens     Unknown- on MAR  . Scopace [Scopolamine] Other (See Comments)    *patch "Made him crazy" per patients daughter     Family History  Problem Relation Age of Onset  . Colon cancer Neg Hx   . Coronary artery disease Brother   . Heart attack Father   . Migraines Mother   . Breast cancer Sister   . Dementia Sister   . Renal Disease Brother     Prior to Admission medications   Medication Sig Start Date End Date Taking? Authorizing Provider  acetaminophen (TYLENOL) 500 MG tablet Take 1,000 mg by mouth 2 (two) times daily. Take every day per [facility]   Yes Historical Provider, MD  albuterol (PROVENTIL HFA;VENTOLIN HFA) 108 (90 Base) MCG/ACT inhaler Inhale 1-2 puffs into the lungs every 6 (six) hours as needed for wheezing or shortness of breath. 05/02/15  Yes April Palumbo, MD  ALPRAZolam Prudy Feeler) 0.25 MG tablet Take 1 tablet (0.25 mg total) by mouth 2 (two) times daily as needed for anxiety. 03/04/15  Yes Donita Brooks, MD  alum & mag hydroxide-simeth (MAALOX/MYLANTA) 200-200-20 MG/5ML suspension Take 30 mLs by mouth every 6 (six) hours as needed for  indigestion or heartburn.   Yes Historical Provider, MD  aspirin EC 81 MG EC tablet Take 1 tablet (81 mg total) by mouth daily. 12/23/14  Yes Shanker Levora Dredge, MD  DEXILANT 60 MG capsule TAKE ONE CAPSULE BY MOUTH EVERY DAY 03/16/14  Yes Donita Brooks, MD  dextromethorphan-guaiFENesin [facility] COLD COUGH+ CHEST) 10-100 MG/5ML liquid Take 10 mLs by mouth every 4 (four) hours as needed for cough.   Yes Historical  Provider, MD  docusate sodium (COLACE) 100 MG capsule Take 1 capsule (100 mg total) by mouth at bedtime. 07/14/14  Yes Donita BrooksWarren T [name], MD  doxazosin (CARDURA) 4 MG tablet Take 1 tablet (4 mg total) by mouth daily. 03/25/15  Yes Donita BrooksWarren T [name], MD  DULoxetine (CYMBALTA) 30 MG capsule TAKE ONE CAPSULE BY MOUTH EVERY DAY 09/07/14  Yes York Spanielharles K Willis, MD  famotidine (PEPCID) 20 MG tablet Take 1 tablet (20 mg total) by mouth 2 (two) times daily. 02/14/15  Yes Albertine GratesFang Xu, MD  finasteride (PROSCAR) 5 MG tablet Take 1 tablet (5 mg total) by mouth daily. 02/14/15  Yes Albertine GratesFang Xu, MD  furosemide (LASIX) 40 MG tablet TAKE 1 TABLET(40 MG) BY MOUTH DAILY Patient taking differently: Take 40 mg by mouth 2 (two) times daily.  06/17/15  Yes Donita BrooksWarren T [name], MD  guaiFENesin 200 MG tablet Take 1 tablet (200 mg total) by mouth every 4 (four) hours as needed for cough or to loosen phlegm. 05/02/15  Yes April Palumbo, MD  hydrALAZINE (APRESOLINE) 50 MG tablet Take 1 tablet (50 mg total) by mouth 3 (three) times daily. 02/14/15  Yes Albertine GratesFang Xu, MD  HYDROcodone-acetaminophen (NORCO/VICODIN) 5-325 MG tablet Take 1 tablet by mouth every 6 (six) hours as needed for moderate pain. 05/13/15  Yes Donita BrooksWarren T [name], MD  ipratropium (ATROVENT) 0.06 % nasal spray Place 2 sprays into both nostrils 4 (four) times daily. 05/02/15  Yes April Palumbo, MD  lactulose, encephalopathy, (CHRONULAC) 10 GM/15ML SOLN Take 6.67 g by mouth daily. 10mL daily   Yes Historical Provider, MD  latanoprost (XALATAN) 0.005 % ophthalmic solution Place 1 drop  into both eyes at bedtime.   Yes Historical Provider, MD  loperamide (IMODIUM) 2 MG capsule Take 2 mg by mouth as needed for diarrhea or loose stools (Not to exceed 8 doses in 24 hours).   Yes Historical Provider, MD  magnesium hydroxide (MILK OF MAGNESIA) 400 MG/5ML suspension Take 30 mLs by mouth at bedtime as needed for mild constipation.   Yes Historical Provider, MD  meclizine (ANTIVERT) 12.5 MG tablet Take 12.5 mg by mouth every 12 (twelve) hours as needed for dizziness.   Yes Historical Provider, MD  MYRBETRIQ 25 MG TB24 tablet TAKE 1 TABLET BY MOUTH EVERY DAY 07/13/14  Yes Donita BrooksWarren T [name], MD  nebivolol (BYSTOLIC) 2.5 MG tablet Take 1 tablet (2.5 mg total) by mouth daily. 02/14/15  Yes Albertine GratesFang Xu, MD  nitroGLYCERIN (NITROSTAT) 0.4 MG SL tablet Place 0.4 mg under the tongue every 5 (five) minutes as needed. Chest pain   Yes Historical Provider, MD  NON FORMULARY Place 2 L into the nose at bedtime.   Yes Historical Provider, MD  ondansetron (ZOFRAN) 4 MG tablet Take 4 mg by mouth every 8 (eight) hours as needed for nausea.   Yes Historical Provider, MD  polyethylene glycol powder (GLYCOLAX/MIRALAX) powder MIX 17 GRAMS WITH 4 TO 6 OUNCES OF WATER AND DRINK ONCE DAILY AS NEEDED FOR CONSTIPATION 03/07/13  Yes Donita BrooksWarren T [name], MD  polyvinyl alcohol (LIQUIFILM TEARS) 1.4 % ophthalmic solution Place 2 drops into both eyes 3 (three) times daily.   Yes Historical Provider, MD  potassium chloride (K-DUR,KLOR-CON) 10 MEQ tablet Take 10 mEq by mouth daily.   Yes Historical Provider, MD  senna-docusate (SENOKOT-S) 8.6-50 MG tablet Take 1 tablet by mouth at bedtime as needed for mild constipation. 02/14/15  Yes Albertine GratesFang Xu, MD  simvastatin (ZOCOR) 40 MG tablet Take 1 tablet (40 mg total) by  mouth daily at 6 PM. 12/23/14  Yes Shanker Levora Dredge, MD  XARELTO 15 MG TABS tablet TAKE 1 TABLET BY MOUTH EVERY DAY WITH SUPPER 10/06/14  Yes Tonny Bollman, MD    Physical Exam:  Filed Vitals:   07/04/15 1145 07/04/15 1155  07/04/15 1200 07/04/15 1215  BP: 127/86  115/67 117/90  Pulse: 88  90 92  Temp:      TempSrc:      Resp: 30  20 23   Height:  5\' 10"  (1.778 m)    Weight:  94.257 kg (207 lb 12.8 oz)    SpO2: 98%  95% 94%      Constitutional: NAD, calm, comfortable well developed white male. Filed Vitals:   07/04/15 1145 07/04/15 1155 07/04/15 1200 07/04/15 1215  BP: 127/86  115/67 117/90  Pulse: 88  90 92  Temp:      TempSrc:      Resp: 30  20 23   Height:  5\' 10"  (1.778 m)    Weight:  94.257 kg (207 lb 12.8 oz)    SpO2: 98%  95% 94%   Eyes: PER, lids and conjunctivae normal ENMT: Mucous membranes are moist. Posterior pharynx clear of any exudate or lesions. Hard of hearing. Neck: normal, supple, no masses Respiratory: clear to auscultation bilaterally, no wheezing, no crackles. Normal respiratory effort. No accessory muscle use.  Cardiovascular: regular rate, irregular rhythm. Pitting edema of BLE.   Abdomen:  Soft, nontender, active bowel sounds. Abdomen is distended, tympanitic.  Musculoskeletal:  No clubbing / cyanosis. Several toes with joint deformity.  Good ROM, no contractures. Normal muscle tone.  Skin: below left knee on outer aspect of LLE there is a skin tear. Tear approx 3 inches long, non-draining. Erythema around and extending above lesion into lateral aspect of left leg. Slight erythema of left inner thigh  Neurologic: CN 2-12 grossly intact. Sensation intact, DTR normal. Strength 5/5 in all 4.  Psychiatric: cooperative. Alert and oriented x 3. Normal mood.    Labs on Admission: I have personally reviewed following labs and imaging studies  CBC:  Recent Labs Lab 07/04/15 1125  WBC 8.8  NEUTROABS 5.6  HGB 11.8*  HCT 37.3*  MCV 95.4  PLT 202    Basic Metabolic Panel:  Recent Labs Lab 07/01/15 1441 07/04/15 1125  NA 136 141  K 4.4 3.8  CL 102 103  CO2 30 27  GLUCOSE 118* 107*  BUN 44* 38*  CREATININE 2.25* 2.18*  CALCIUM 8.5* 8.7*    GFR: Estimated  Creatinine Clearance: 24.4 mL/min (by C-G formula based on Cr of 2.18).  Liver Function Tests:  Recent Labs Lab 07/04/15 1125  AST 17  ALT 16*  ALKPHOS 49  BILITOT 0.8  PROT 6.8  ALBUMIN 2.9*   CUrine analysis:    Component Value Date/Time   COLORURINE RED* 07/04/2015 1145   APPEARANCEUR CLOUDY* 07/04/2015 1145   LABSPEC 1.013 07/04/2015 1145   PHURINE 7.0 07/04/2015 1145   GLUCOSEU NEGATIVE 07/04/2015 1145   HGBUR LARGE* 07/04/2015 1145   BILIRUBINUR NEGATIVE 07/04/2015 1145   KETONESUR NEGATIVE 07/04/2015 1145   PROTEINUR NEGATIVE 07/04/2015 1145   UROBILINOGEN 0.2 12/21/2014 1940   NITRITE NEGATIVE 07/04/2015 1145   LEUKOCYTESUR TRACE* 07/04/2015 1145    Radiological Exams on Admission: Dg Chest 2 View  07/04/2015  CLINICAL DATA:  Pt c/o SOB and nausea x 3 weeks. Hx of CAD and PE. Pt also c/o lateral right hip pain x 3 days. No injury. Nx of  prior injuries or surgeries. EXAM: CHEST  2 VIEW COMPARISON:  05/04/2015 FINDINGS: Heart is mildly enlarged. The lungs are free of focal consolidations and pleural effusions. No pulmonary edema. Visualized osseous structures have a normal appearance. IMPRESSION: Mild cardiomegaly.  No evidence for acute pulmonary abnormality. Electronically Signed   By: Norva Pavlov M.D.   On: 07/04/2015 13:17   Dg Pelvis 1-2 Views  07/04/2015  CLINICAL DATA:  Pt c/o SOB and nausea x 3 weeks. Hx of CAD and PE. Pt also c/o lateral right hip pain x 3 days. No injury. Nx of prior injuries or surgeries. EXAM: PELVIS - 1-2 VIEW COMPARISON:  CT 06/23/2015 FINDINGS: Mild degenerative changes in bilateral hips with mild narrowing of the articular cartilage and early marginal spur formation. There is some subchondral sclerosis in both femoral heads right greater than left. No fracture or dislocation. Degenerative disc disease evident in the visualized lower lumbar spine. Bilateral pelvic phleboliths. Pelvic ring and sacrum appear intact. IMPRESSION: 1.  Degenerative changes in bilateral hips and lower lumbar spine. 2. Negative for fracture or other acute bone abnormality Electronically Signed   By: Corlis Leak M.D.   On: 07/04/2015 13:07   Echo Sept 2016 Study Conclusions  - Left ventricle: The cavity size was normal. There was mild focal  basal hypertrophy of the septum. Systolic function was normal.  The estimated ejection fraction was in the range of 50% to 55%.  Wall motion was normal; there were no regional wall motion  abnormalities. Doppler parameters are consistent with abnormal  left ventricular relaxation (grade 1 diastolic dysfunction). - Aortic valve: Trileaflet; moderately thickened, moderately  calcified leaflets. There was trivial regurgitation. - Left atrium: The atrium was moderately dilated. - Pulmonic valve: There was moderate regurgitation. - Pulmonary arteries: Systolic pressure was mildly increased. PA  peak pressure: 38 mm Hg (S). - Pericardium, extracardiac: A trivial pericardial effusion was  identified posterior to the heart.   Assessment/Plan  Cellulitis of LLE. There is a 3 inch skin tear to outer aspect of LLE (below knee).with surrounding erythema advancing up outer thigh and to a much lesser degree his inner thigh as well.    -Admit to medical bed  -Cellulitis order set utilized -Vancomycin + Zosyn given in ED. Will narrow to Unasyn to cover cellulitis and possible UTI. If doesn't improve may need  to broaden antibiotics to cover MRSA.  -follow up on blood cultures -WOC nurse for dressing change recommendations. -PT evaluation. Lives in Assisted Living facility. Currently with limited mobility do to leg wound and edema.    Peripheral edema / hx of grade 1 diastolic heart failure on echo Sept 2016. BNP only 353 and no signs of heart failure on CXR. No ascites on recent CTscan done for abdominal bloating. Hypoalbuminemia may be contributing to edema. Also, up until recently patient was on chronic  Prednisone.  -continue home dose of lasix  BID for now -daily wts -I & 0  Acute renal failure superimposed on stage 3 chronic kidney disease (HCC). Cr 2.18, up from baseline of around 1.8. Recent significant increase in lasix dose by PCP for anasarca and weight gain. Patient did have 7 pound weight loss after increasing diuretics. Of note, Cr was up to 2.25 on 4/20 so it has improved since patient resumed usual dose of lasix.  -am BMET  Hematuria. Bladder wall thickening on recent CTscan. Etiology of hematuria unclear - send urine for culture - Unasyn should cover gram negatives  PAF (paroxysmal atrial  fibrillation), rate controlled 80-90s. Chadsvasc 7 -continue home Xarelto-pharmacy to dose given renal function  Essential hypertension. Controlled in ED -continue home antihypertensives  Hx of DVT, s/p; IVC  GERD.  -continue daily PPI  CAD (coronary artery disease). Stable, no chest pain.    Vertigo, chronic.    -continue home antivert      DVT prophylaxis: On Xarelto Code Status:   DNR - Daughter has paperwork at home  Family Communication:  Discussed with daughter Jason [name] in room. She understands plan of treatment.  Disposition Plan: Expect patient to be discharged to home Consults called:    None Admission status: Inpatient     Willette Cluster, NP [facility]   If 7PM-7AM, please contact night-coverage [email or web address] Password [email or web address]  07/04/2015, 1:42 PM

## 2015-07-04 NOTE — Progress Notes (Signed)
ANTICOAGULATION CONSULT NOTE - Initial Consult  Pharmacy Consult for xarelto Indication: atrial fibrillation  Allergies  Allergen Reactions  . Ativan [Lorazepam] Other (See Comments)    unknown  . Augmentin [Amoxicillin-Pot Clavulanate] Nausea And Vomiting    daughter states he can tolerate Amoxicillin ok Profuse vomiting. Unable to answer penicillin questions as pt is from Nursing home and unable to provide answers to questions  . Belladonna Alkaloids     Unknown- on MAR  . Corticosteroids     Unknown- on MAR  . Fludrocortisone Acetate Other (See Comments)    "leg swelling"  . Parabens     Unknown- on MAR  . Scopace [Scopolamine] Other (See Comments)    *patch "Made him crazy" per patients daughter     Patient Measurements: Height: 5\' 10"  (177.8 cm) Weight: 207 lb 12.8 oz (94.257 kg) IBW/kg (Calculated) : 73   Vital Signs: Temp: 98 F (36.7 C) (04/23 1128) Temp Source: Rectal (04/23 1128) BP: 116/71 mmHg (04/23 1415) Pulse Rate: 92 (04/23 1415)  Labs:  Recent Labs  07/04/15 1125  HGB 11.8*  HCT 37.3*  PLT 202  CREATININE 2.18*    Estimated Creatinine Clearance: 24.4 mL/min (by C-G formula based on Cr of 2.18).  Assessment: [age] YOM here with cellulitis. On Xarelto 15mg  PO qsupper PTA for AFib.  SCr 2.18, CrCl ~20-6425mL/min. Hgb 11.8, plts 202- no overt bleeding noted  Goal of Therapy:    Monitor platelets by anticoagulation protocol: Yes   Plan:   -Xarelto for Afib can be continued at reduce dose of 15mg  PO qsupper until CrCl is <6215mL/min -CBC q72h- follow for s/s bleeding -pharmacy will sign off and follow peripherally  Terrye Dombrosky D. Aarik Blank, PharmD, BCPS Clinical Pharmacist Pager: [phone] 07/04/2015 3:18 PM

## 2015-07-04 NOTE — Progress Notes (Addendum)
Pharmacy Antibiotic Note  Jason [name] is a 82 y.o. male admitted on 07/04/2015 with cellulitis.  Pharmacy has been consulted for vancomycin dosing.  Patient fell out of his wheelchair 3 days ago and sustained skin tear- has been treated with wound care, but with increased redness and swelling.  Given one time doses of vancomycin and Zosyn. Also suspected UTI.  Plan: -Unasyn 1.5g IV q12h -follow c/s, clinical progression, renal function, LOT   Height: 5\' 10"  (177.8 cm) Weight: 207 lb 12.8 oz (94.257 kg) IBW/kg (Calculated) : 73  Temp (24hrs), Avg:98.3 F (36.8 C), Min:98 F (36.7 C), Max:98.5 F (36.9 C)   Recent Labs Lab 07/01/15 1441 07/04/15 1125 07/04/15 1141 07/04/15 1500  WBC  --  8.8  --   --   CREATININE 2.25* 2.18*  --   --   LATICACIDVEN  --   --  1.06 1.21    Estimated Creatinine Clearance: 24.4 mL/min (by C-G formula based on Cr of 2.18).    Allergies  Allergen Reactions  . Ativan [Lorazepam] Other (See Comments)    unknown  . Augmentin [Amoxicillin-Pot Clavulanate] Nausea And Vomiting    daughter states he can tolerate Amoxicillin ok Profuse vomiting. Unable to answer penicillin questions as pt is from Nursing home and unable to provide answers to questions  . Belladonna Alkaloids     Unknown- on MAR  . Corticosteroids     Unknown- on MAR  . Fludrocortisone Acetate Other (See Comments)    "leg swelling"  . Parabens     Unknown- on MAR  . Scopace [Scopolamine] Other (See Comments)    *patch "Made him crazy" per patients daughter     Antimicrobials this admission: vancomycin 4/23 x1 Zosyn 4/23 x1 Unasyn 4/23>>   Dose adjustments this admission: n/a  Microbiology results: 4/23 UCx: sent    Thank you for allowing pharmacy to be a part of this patient's care.  Syra Sirmons D. Porschia Willbanks, PharmD, BCPS Clinical Pharmacist Pager: [phone] 07/04/2015 3:11 PM

## 2015-07-04 NOTE — ED Provider Notes (Signed)
CSN: [id number]     Arrival date & time 07/04/15  1104 History   First MD Initiated Contact with Patient 07/04/15 1114     Chief Complaint  Patient presents with  . Wound Infection     (Consider location/radiation/quality/duration/timing/severity/associated sxs/prior Treatment) HPI Patient is coming from nursing home care. He fell out of his wheelchair proximate 3 days ago sustained a skin tear to his left lower leg. They were treating it with wound care but he has developed significant redness and swelling with appearance of worsening infection. Patient reports that he has some pain in his right groin since this fall occurred. She also has some pain in the left lower leg but not severe. No chest pain. (Patient does have a notable bruise on his right thorax.) No reported fevers. Patient reports his appetite has been good. Past Medical History  Diagnosis Date  . PVD (peripheral vascular disease) (HCC)   . CAD (coronary artery disease)     a. 12/2005 - PCI to OM  . Hyperlipidemia   . Cerebrovascular accident (HCC)   . DVT femoral (deep venous thrombosis) with thrombophlebitis (HCC) 03/2007    [name]/notes 07/13/2010  . GERD (gastroesophageal reflux disease)   . Edema   . Vertigo   . IBS (irritable bowel syndrome)   . Duodenitis   . Gastritis   . Esophageal stricture   . Cellulitis   . Pulmonary embolism (HCC)   . Prostatic hypertrophy   . Frequent falls   . Subdural hematoma (HCC)   . PTSD (post-traumatic stress disorder)   . Vertigo   . TIA (transient ischemic attack)     a. 01/2006 and 05/2006.   [name]. Hiatal hernia     periferal neuropathy  . Hx of echocardiogram     Echo 9/13:  Mild LVH, EF 60-65%, Gr 1 diast dysfn, mild AI, mild BAE  . Gout   . Chronic low back pain   . Gait disorder   . Peripheral neuropathy (HCC)   . Hydrocele   . History of shingles     Left lumbar  . HOH (hard of hearing)     hearing aid, totally in R ear ( no hearing aid in L )   . Myocardial infarction  [facility](HCC) "years ago"    "dr said I'd had a small one"  . Daily headache   . Depression   . Urinary frequency   . Blind left eye   . Hypertension   . Dysrhythmia   . Arthritis   . Anxiety     PTSD  . Hemiparesis and alteration of sensations as late effects of stroke (HCC) 03/04/2015   Past Surgical History  Procedure Laterality Date  . Hand surgery Right     "replaced bone in my finger"  . Angioplasty    . Carotid endarterectomy Left   . Coronary angioplasty with stent placement      left circumflex  . Cataract extraction, bilateral    . Tonsillectomy    . Tympanic membrane repair Right 1978    [name]/notes 07/27/2010; "couldn'getting so t couldn't hear; had noise in my ear; didn't correct either  . Hand contracture release Right     palm/notes 07/27/2010  . Esophagogastroduodenoscopy (egd) with esophageal dilation    . Insertion of vena cava filter  2013    h/o PE  . Endarterectomy Left 11/02/2014    Procedure: LEFT  CAROTID ARTERY ENDARTERECTOMY  WITH DACRON PATCH ANGIOPLASTY;  Surgeon: Sherren Kernsharles E Fields, MD;  Location: [facility] OR;  Service: Vascular;  Laterality: Left;   Family History  Problem Relation Age of Onset  . Colon cancer Neg Hx   . Coronary artery disease Brother   . Heart attack Father   . Migraines Mother   . Breast cancer Sister   . Dementia Sister   . Renal Disease Brother    Social History  Substance Use Topics  . Smoking status: Never Smoker   . Smokeless tobacco: Never Used  . Alcohol Use: 0.0 oz/week    0 Standard drinks or equivalent per week     Comment: wine occasionally    Review of Systems  10 Systems reviewed and are negative for acute change except as noted in the HPI.   Allergies  Ativan; Augmentin; Belladonna alkaloids; Corticosteroids; Fludrocortisone acetate; Parabens; and Scopace  Home Medications   Prior to Admission medications   Medication Sig Start Date End Date Taking? Authorizing Provider  acetaminophen (TYLENOL) 500 MG tablet Take  1,000 mg by mouth 2 (two) times daily. Take every day per [facility]   Yes Historical Provider, MD  albuterol (PROVENTIL HFA;VENTOLIN HFA) 108 (90 Base) MCG/ACT inhaler Inhale 1-2 puffs into the lungs every 6 (six) hours as needed for wheezing or shortness of breath. 05/02/15  Yes April Palumbo, MD  ALPRAZolam Prudy Feeler) 0.25 MG tablet Take 1 tablet (0.25 mg total) by mouth 2 (two) times daily as needed for anxiety. 03/04/15  Yes Donita Brooks, MD  alum & mag hydroxide-simeth (MAALOX/MYLANTA) 200-200-20 MG/5ML suspension Take 30 mLs by mouth every 6 (six) hours as needed for indigestion or heartburn.   Yes Historical Provider, MD  aspirin EC 81 MG EC tablet Take 1 tablet (81 mg total) by mouth daily. 12/23/14  Yes Shanker Levora Dredge, MD  DEXILANT 60 MG capsule TAKE ONE CAPSULE BY MOUTH EVERY DAY 03/16/14  Yes Donita Brooks, MD  dextromethorphan-guaiFENesin [facility] COLD COUGH+ CHEST) 10-100 MG/5ML liquid Take 10 mLs by mouth every 4 (four) hours as needed for cough.   Yes Historical Provider, MD  docusate sodium (COLACE) 100 MG capsule Take 1 capsule (100 mg total) by mouth at bedtime. 07/14/14  Yes Donita Brooks, MD  doxazosin (CARDURA) 4 MG tablet Take 1 tablet (4 mg total) by mouth daily. 03/25/15  Yes Donita Brooks, MD  DULoxetine (CYMBALTA) 30 MG capsule TAKE ONE CAPSULE BY MOUTH EVERY DAY 09/07/14  Yes York Spaniel, MD  famotidine (PEPCID) 20 MG tablet Take 1 tablet (20 mg total) by mouth 2 (two) times daily. 02/14/15  Yes Albertine Grates, MD  finasteride (PROSCAR) 5 MG tablet Take 1 tablet (5 mg total) by mouth daily. 02/14/15  Yes Albertine Grates, MD  furosemide (LASIX) 40 MG tablet TAKE 1 TABLET(40 MG) BY MOUTH DAILY Patient taking differently: Take 40 mg by mouth 2 (two) times daily.  06/17/15  Yes Donita Brooks, MD  guaiFENesin 200 MG tablet Take 1 tablet (200 mg total) by mouth every 4 (four) hours as needed for cough or to loosen phlegm. 05/02/15  Yes April Palumbo, MD  hydrALAZINE (APRESOLINE) 50 MG  tablet Take 1 tablet (50 mg total) by mouth 3 (three) times daily. 02/14/15  Yes Albertine Grates, MD  HYDROcodone-acetaminophen (NORCO/VICODIN) 5-325 MG tablet Take 1 tablet by mouth every 6 (six) hours as needed for moderate pain. 05/13/15  Yes Donita Brooks, MD  ipratropium (ATROVENT) 0.06 % nasal spray Place 2 sprays into both nostrils 4 (four) times daily. 05/02/15  Yes April Palumbo,  MD  lactulose, encephalopathy, (CHRONULAC) 10 GM/15ML SOLN Take 6.67 g by mouth daily. 10mL daily   Yes Historical Provider, MD  latanoprost (XALATAN) 0.005 % ophthalmic solution Place 1 drop into both eyes at bedtime.   Yes Historical Provider, MD  loperamide (IMODIUM) 2 MG capsule Take 2 mg by mouth as needed for diarrhea or loose stools (Not to exceed 8 doses in 24 hours).   Yes Historical Provider, MD  magnesium hydroxide (MILK OF MAGNESIA) 400 MG/5ML suspension Take 30 mLs by mouth at bedtime as needed for mild constipation.   Yes Historical Provider, MD  meclizine (ANTIVERT) 12.5 MG tablet Take 12.5 mg by mouth every 12 (twelve) hours as needed for dizziness.   Yes Historical Provider, MD  MYRBETRIQ 25 MG TB24 tablet TAKE 1 TABLET BY MOUTH EVERY DAY 07/13/14  Yes Donita Brooks, MD  nebivolol (BYSTOLIC) 2.5 MG tablet Take 1 tablet (2.5 mg total) by mouth daily. 02/14/15  Yes Albertine Grates, MD  nitroGLYCERIN (NITROSTAT) 0.4 MG SL tablet Place 0.4 mg under the tongue every 5 (five) minutes as needed. Chest pain   Yes Historical Provider, MD  NON FORMULARY Place 2 L into the nose at bedtime.   Yes Historical Provider, MD  ondansetron (ZOFRAN) 4 MG tablet Take 4 mg by mouth every 8 (eight) hours as needed for nausea.   Yes Historical Provider, MD  polyethylene glycol powder (GLYCOLAX/MIRALAX) powder MIX 17 GRAMS WITH 4 TO 6 OUNCES OF WATER AND DRINK ONCE DAILY AS NEEDED FOR CONSTIPATION 03/07/13  Yes Donita Brooks, MD  polyvinyl alcohol (LIQUIFILM TEARS) 1.4 % ophthalmic solution Place 2 drops into both eyes 3 (three) times  daily.   Yes Historical Provider, MD  potassium chloride (K-DUR,KLOR-CON) 10 MEQ tablet Take 10 mEq by mouth daily.   Yes Historical Provider, MD  senna-docusate (SENOKOT-S) 8.6-50 MG tablet Take 1 tablet by mouth at bedtime as needed for mild constipation. 02/14/15  Yes Albertine Grates, MD  simvastatin (ZOCOR) 40 MG tablet Take 1 tablet (40 mg total) by mouth daily at 6 PM. 12/23/14  Yes Shanker Levora Dredge, MD  XARELTO 15 MG TABS tablet TAKE 1 TABLET BY MOUTH EVERY DAY WITH SUPPER 10/06/14  Yes Tonny Bollman, MD   BP 117/90 mmHg  Pulse 82  Temp(Src) 98 F (36.7 C) (Rectal)  Resp 22  Ht  (1.778 m)  Wt 207 lb 12.8 oz (94.257 kg)  BMI 29.82 kg/m2  SpO2 97% Physical Exam  Constitutional:  Patient is moderately obese and deconditioned. No respiratory distress at rest. Mental status is clear.  HENT:  Head: Normocephalic and atraumatic.  Mouth/Throat: Oropharynx is clear and moist.  Eyes: EOM are normal. Pupils are equal, round, and reactive to light.  Neck: Neck supple.  Cardiovascular: Normal rate, regular rhythm, normal heart sounds and intact distal pulses.   Pulmonary/Chest: Effort normal and breath sounds normal.  Ecchymotic bruise approximately 10 x 10 cm to right lateral chest wall. No overlying skin abrasion or associated cellulitis.  Abdominal: Soft. Bowel sounds are normal. He exhibits no distension. There is no tenderness.  Musculoskeletal:  3+ edema of the left lower leg. Lateral lower leg has a healing skin tear that is approximately 10 x 8 cm. There is some purulent drainage associated. Patient does have deep, blanching erythema extending proximally approximately 10 m to above the knee. Cellulitis also appears to be circumferential around the calf region.  Neurological: He is alert. Coordination normal.  Skin: Skin is warm and dry.  Psychiatric: He has a normal mood and affect.    ED Course  Procedures (including critical care time) Labs Review Labs Reviewed  COMPREHENSIVE  METABOLIC PANEL - Abnormal; Notable for the following:    Glucose, Bld 107 (*)    BUN 38 (*)    Creatinine, Ser 2.18 (*)    Calcium 8.7 (*)    Albumin 2.9 (*)    ALT 16 (*)    GFR calc non Af Amer 24 (*)    GFR calc Af Amer 28 (*)    All other components within normal limits  CBC WITH DIFFERENTIAL/PLATELET - Abnormal; Notable for the following:    RBC 3.91 (*)    Hemoglobin 11.8 (*)    HCT 37.3 (*)    RDW 15.9 (*)    Monocytes Absolute 1.4 (*)    All other components within normal limits  URINALYSIS, ROUTINE W REFLEX MICROSCOPIC (NOT AT [facility]) - Abnormal; Notable for the following:    Color, Urine RED (*)    APPearance CLOUDY (*)    Hgb urine dipstick LARGE (*)    Leukocytes, UA TRACE (*)    All other components within normal limits  BRAIN NATRIURETIC PEPTIDE - Abnormal; Notable for the following:    B Natriuretic Peptide 353.1 (*)    All other components within normal limits  URINE MICROSCOPIC-ADD ON - Abnormal; Notable for the following:    Squamous Epithelial / LPF 0-5 (*)    Bacteria, UA RARE (*)    All other components within normal limits  URINE CULTURE  I-STAT CG4 LACTIC ACID, ED  I-STAT CG4 LACTIC ACID, ED    Imaging Review Dg Chest 2 View  07/04/2015  CLINICAL DATA:  Pt c/o SOB and nausea x 3 weeks. Hx of CAD and PE. Pt also c/o lateral right hip pain x 3 days. No injury. Nx of prior injuries or surgeries. EXAM: CHEST  2 VIEW COMPARISON:  05/04/2015 FINDINGS: Heart is mildly enlarged. The lungs are free of focal consolidations and pleural effusions. No pulmonary edema. Visualized osseous structures have a normal appearance. IMPRESSION: Mild cardiomegaly.  No evidence for acute pulmonary abnormality. Electronically Signed   By: Norva Pavlov M.D.   On: 07/04/2015 13:17   Dg Pelvis 1-2 Views  07/04/2015  CLINICAL DATA:  Pt c/o SOB and nausea x 3 weeks. Hx of CAD and PE. Pt also c/o lateral right hip pain x 3 days. No injury. Nx of prior injuries or surgeries. EXAM:  PELVIS - 1-2 VIEW COMPARISON:  CT 06/23/2015 FINDINGS: Mild degenerative changes in bilateral hips with mild narrowing of the articular cartilage and early marginal spur formation. There is some subchondral sclerosis in both femoral heads right greater than left. No fracture or dislocation. Degenerative disc disease evident in the visualized lower lumbar spine. Bilateral pelvic phleboliths. Pelvic ring and sacrum appear intact. IMPRESSION: 1. Degenerative changes in bilateral hips and lower lumbar spine. 2. Negative for fracture or other acute bone abnormality Electronically Signed   By: Corlis Leak M.D.   On: 07/04/2015 13:07   I have personally reviewed and evaluated these images and lab results as part of my medical decision-making.   EKG Interpretation None      MDM   Final diagnoses:  Cellulitis of left lower extremity  Severe comorbid illness   Patient present from nursing home care with a worsening lower extremity wound sustained approximately 3 days ago. The skin tear region appears to be healing adequately however he has extensive  blanching erythema extending proximally consistent with secondary infection and cellulitis. At this time, with extent of swelling and erythema patient will be admitted for IV antibiotics and continue monitoring.     Arby Barrette, MD 07/04/15 [phone]

## 2015-07-05 ENCOUNTER — Telehealth: Payer: Self-pay | Admitting: Family Medicine

## 2015-07-05 ENCOUNTER — Inpatient Hospital Stay (HOSPITAL_COMMUNITY): Payer: Medicare Other

## 2015-07-05 DIAGNOSIS — K5900 Constipation, unspecified: Secondary | ICD-10-CM | POA: Diagnosis not present

## 2015-07-05 DIAGNOSIS — N183 Chronic kidney disease, stage 3 (moderate): Secondary | ICD-10-CM | POA: Diagnosis not present

## 2015-07-05 DIAGNOSIS — L03116 Cellulitis of left lower limb: Secondary | ICD-10-CM

## 2015-07-05 DIAGNOSIS — R319 Hematuria, unspecified: Secondary | ICD-10-CM | POA: Diagnosis not present

## 2015-07-05 DIAGNOSIS — R109 Unspecified abdominal pain: Secondary | ICD-10-CM | POA: Diagnosis not present

## 2015-07-05 LAB — CBC
HEMATOCRIT: 35.5 % — AB (ref 39.0–52.0)
HEMOGLOBIN: 11 g/dL — AB (ref 13.0–17.0)
MCH: 29.4 pg (ref 26.0–34.0)
MCHC: 31 g/dL (ref 30.0–36.0)
MCV: 94.9 fL (ref 78.0–100.0)
Platelets: 207 10*3/uL (ref 150–400)
RBC: 3.74 MIL/uL — ABNORMAL LOW (ref 4.22–5.81)
RDW: 15.8 % — ABNORMAL HIGH (ref 11.5–15.5)
WBC: 8.3 10*3/uL (ref 4.0–10.5)

## 2015-07-05 LAB — BASIC METABOLIC PANEL
ANION GAP: 14 (ref 5–15)
BUN: 37 mg/dL — ABNORMAL HIGH (ref 6–20)
CHLORIDE: 100 mmol/L — AB (ref 101–111)
CO2: 26 mmol/L (ref 22–32)
Calcium: 8.4 mg/dL — ABNORMAL LOW (ref 8.9–10.3)
Creatinine, Ser: 2.14 mg/dL — ABNORMAL HIGH (ref 0.61–1.24)
GFR calc Af Amer: 29 mL/min — ABNORMAL LOW (ref >60)
GFR calc non Af Amer: 25 mL/min — ABNORMAL LOW (ref >60)
GLUCOSE: 86 mg/dL (ref 65–99)
POTASSIUM: 3.3 mmol/L — AB (ref 3.5–5.1)
Sodium: 140 mmol/L (ref 135–145)

## 2015-07-05 LAB — URINE CULTURE

## 2015-07-05 LAB — MRSA PCR SCREENING: MRSA BY PCR: NEGATIVE

## 2015-07-05 MED ORDER — DEXLANSOPRAZOLE 60 MG PO CPDR
60.0000 mg | DELAYED_RELEASE_CAPSULE | Freq: Every day | ORAL | Status: DC
  Administered 2015-07-06 – 2015-07-07 (×2): 60 mg via ORAL
  Filled 2015-07-05 (×6): qty 1

## 2015-07-05 MED ORDER — POLYETHYLENE GLYCOL 3350 17 G PO PACK
17.0000 g | PACK | Freq: Two times a day (BID) | ORAL | Status: DC
  Administered 2015-07-05 – 2015-07-07 (×5): 17 g via ORAL
  Filled 2015-07-05 (×5): qty 1

## 2015-07-05 MED ORDER — POTASSIUM CHLORIDE CRYS ER 20 MEQ PO TBCR
40.0000 meq | EXTENDED_RELEASE_TABLET | Freq: Once | ORAL | Status: AC
  Administered 2015-07-05: 40 meq via ORAL
  Filled 2015-07-05: qty 2

## 2015-07-05 MED ORDER — SODIUM CHLORIDE 0.9 % IV BOLUS (SEPSIS)
250.0000 mL | Freq: Once | INTRAVENOUS | Status: AC
  Administered 2015-07-05: 250 mL via INTRAVENOUS

## 2015-07-05 NOTE — Progress Notes (Addendum)
PROGRESS NOTE  Earnstine Regalaul L Ayub ZOX:[id number] DOB: [birth year] DOA: 07/04/2015 PCP: Leo GrosserPICKARD,[name], MD  Brief Narrative: 82 year old man presented to the emergency department with increased erythema after a skin tear earlier in the week. He was admitted for cellulitis.  Assessment/Plan: 1. Cellulitis left lower extremity. Improving with regression of erythema from marked border. Afebrile,stable hemodynamic 2. CKD stage III, at baseline. 3. Hematuria of unclear significance. Urine culture pending. 4. Chronic diastolic CHF. Stable.  5. PMH DVT 6. Atrial fibrillation 7. Chronic constipation   Appears stable. Improving with abx.  Continue Unasyn and follow  Follow-up urine culture.   Aggressive bowel regimen, check for impaction.  DVT prophylaxis: Xarelto Code Status: DNR Family Communication: discussed in detail with daughter [name] at bedside Disposition Plan: SNF?  Brendia Sacksaniel Rosealynn Mateus, MD  [facility] Direct contact:  --Via amion app OR  --[email or web address]; password [email or web address] and click  7PM-7AM contact night coverage as above 07/05/2015, 3:30 PM  LOS: 1 day   Consultants:  PT SNF  Procedures:    Antimicrobials:  Unasyn 4/23 >>  HPI/Subjective: Complains of some stomach pain. Daughter bedside reports chronic constipation with only very small bowel movements daily. Has has multiple instances in past where he has needed to be "cleaned out".  Objective: Filed Vitals:   07/04/15 1959 07/05/15 0432 07/05/15 1035 07/05/15 1522  BP: 105/62 118/94 129/77 87/51  Pulse: 83 88 91 82  Temp: 97.7 F (36.5 C) 97.4 F (36.3 C)  97.9 F (36.6 C)  TempSrc: Oral Oral  Oral  Resp: 18 18  16   Height:      Weight:      SpO2: 96% 98%      Intake/Output Summary (Last 24 hours) at 07/05/15 1530 Last data filed at 07/05/15 1100  Gross per 24 hour  Intake    380 ml  Output    150 ml  Net    230 ml     Filed Weights   07/04/15 1155  Weight: 94.257 kg (207 lb 12.8 oz)     Exam:    Constitutional:  . Appears calm and comfortable Respiratory:  . CTA bilaterally, no w/r/r.  . Respiratory effort normal. No retractions or accessory muscle use Cardiovascular:  . RRR, no m/r/g . No LE extremity edema   Abdomen:  . Abdomen distended; mild tenderness generalized Skin: LLE erythema with marked regression from marked line Psychiatric:  . Mental status o Mood, affect appropriate  I have personally reviewed following labs and imaging studies:  Creatinine at baseline 2.14, potassium 3.3  Abdominal films show retained stool.  Scheduled Meds: . acetaminophen  1,000 mg Oral BID  . ampicillin-sulbactam (UNASYN) IV  1.5 g Intravenous Q12H  . aspirin EC  81 mg Oral Daily  . doxazosin  4 mg Oral Daily  . DULoxetine  30 mg Oral Daily  . famotidine  20 mg Oral BID  . finasteride  5 mg Oral Daily  . furosemide  40 mg Oral BID  . hydrALAZINE  50 mg Oral TID  . ipratropium  2 spray Each Nare QID  . latanoprost  1 drop Both Eyes QHS  . mirabegron ER  25 mg Oral Daily  . nebivolol  2.5 mg Oral Daily  . pantoprazole  40 mg Oral Daily  . polyethylene glycol  17 g Oral BID  . polyvinyl alcohol  2 drop Both Eyes TID  . potassium chloride  10 mEq Oral Daily  . potassium chloride  40 mEq Oral Once  .  rivaroxaban  15 mg Oral Q supper  . simvastatin  40 mg Oral q1800   Continuous Infusions:   Principal Problem:   Cellulitis of left lower extremity Active Problems:   GERD   Chronic constipation   CKD (chronic kidney disease), stage III   PAF (paroxysmal atrial fibrillation) (HCC)   Peripheral edema   Essential hypertension   Hematuria   LOS: 1 day   Time spent 20 minutes

## 2015-07-05 NOTE — Evaluation (Signed)
Physical Therapy Evaluation Patient Details Name: Jason [name] MRN: [id number] DOB: 10/11/1919 Today's Date: 07/05/2015   History of Present Illness  82 yo admitted from ALF due to LLE wound and cellulitis. PMHx: Afib, CHF, HOH, CKD, CAD, PVD, HTN, PTSD, vertigo, CVA  Clinical Impression  Pt with flat affect but pleasant throughout session. Pt very HOH with Left ear better than right. Dgtr present throughout and able to provide PLOF. Pt comfortable at ALF but does not have appropriate care for safety and fall risk and pt and dgtr agreeable to ST-SNF. Pt with decreased strength, mobility, balance and function who will benefit from acute therapy to maximize mobility and balance to decrease fall risk.     Follow Up Recommendations SNF;Supervision/Assistance - 24 hour    Equipment Recommendations  Rolling walker with 5" wheels;3in1 (PT)    Recommendations for Other Services       Precautions / Restrictions Precautions Precautions: Fall Precaution Comments: family reports grossly 6 falls in last year Restrictions Weight Bearing Restrictions: No      Mobility  Bed Mobility               General bed mobility comments: pt in chair on arrival  Transfers Overall transfer level: Needs assistance   Transfers: Sit to/from Stand Sit to Stand: Min guard         General transfer comment: cues for hand placement, assist to control descent, pt sitting prematurely during gait without notice or reaching for chair  Ambulation/Gait Ambulation/Gait assistance: Min guard;+2 safety/equipment Ambulation Distance (Feet): 12 Feet Assistive device: Rolling walker (2 wheeled) Gait Pattern/deviations: Step-to pattern;Trunk flexed;Shuffle   Gait velocity interpretation: Below normal speed for age/gender General Gait Details: multimodal cues for posture, position in RW and safety with chair to follow closely behind  Stairs            Wheelchair Mobility    Modified Rankin (Stroke  Patients Only)       Balance Overall balance assessment: Needs assistance   Sitting balance-Leahy Scale: Fair       Standing balance-Leahy Scale: Poor                               Pertinent Vitals/Pain Pain Assessment: 0-10 Pain Score: 3  Pain Location: dizzy, nausea and HA Pain Descriptors / Indicators: Aching Pain Intervention(s): Limited activity within patient's tolerance;Monitored during session    Home Living Family/patient expects to be discharged to:: Assisted living               Home Equipment: Walker - 4 wheels;Shower seat;Hand held shower head;Grab bars - toilet;Grab bars - tub/shower;Hospital bed      Prior Function Level of Independence: Independent with assistive device(s)         Comments[facility], assist for bathing, assist at times for dressing. Non ambulatory for last 6 weeks     Hand Dominance   Dominant Hand: Right    Extremity/Trunk Assessment   Upper Extremity Assessment: Generalized weakness           Lower Extremity Assessment: Generalized weakness      Cervical / Trunk Assessment: Kyphotic  Communication   Communication: HOH  Cognition Arousal/Alertness: Awake/alert Behavior During Therapy: WFL for tasks assessed/performed Overall Cognitive Status: History of cognitive impairments - at baseline  General Comments      Exercises General Exercises - Lower Extremity Long Arc Quad: Seated;AROM;Both;5 reps Hip Flexion/Marching: AROM;Seated;Both;5 reps      Assessment/Plan    PT Assessment Patient needs continued PT services  PT Diagnosis Difficulty walking;Generalized weakness;Altered mental status   PT Problem List Decreased strength;Decreased range of motion;Decreased activity tolerance;Decreased balance;Decreased knowledge of use of DME;Decreased mobility  PT Treatment Interventions DME instruction;Gait training;Functional mobility  training;Therapeutic activities;Therapeutic exercise;Balance training;Patient/family education   PT Goals (Current goals can be found in the Care Plan section) Acute Rehab PT Goals Patient Stated Goal: return to ALF and walking PT Goal Formulation: With patient/family Time For Goal Achievement: 07/19/15 Potential to Achieve Goals: Fair    Frequency Min 2X/week   Barriers to discharge Decreased caregiver support 24 hr care not available at ALF, pt stands frequently at baseline due to urinary frequency and incontinence    Co-evaluation               End of Session Equipment Utilized During Treatment: Gait belt Activity Tolerance: Patient tolerated treatment well Patient left: in chair;with call bell/phone within reach;with chair alarm set;with family/visitor present Nurse Communication: Mobility status;Precautions         Time: 1610-9604 PT Time Calculation (min) (ACUTE ONLY): 27 min   Charges:   PT Evaluation $PT Eval Moderate Complexity: 1 Procedure PT Treatments $Gait Training: 8-22 mins   PT G CodesDelorse [name] 07/05/2015, 1:30 PM Jason [name], PT [phone]

## 2015-07-05 NOTE — Discharge Instructions (Signed)
Information on my medicine - XARELTO® (Rivaroxaban) ° °This medication education was reviewed with me or my healthcare representative as part of my discharge preparation.   ° °Why was Xarelto® prescribed for you? °Xarelto® was prescribed for you to reduce the risk of a blood clot forming that can cause a stroke if you have a medical condition called atrial fibrillation (a type of irregular heartbeat). ° °What do you need to know about xarelto® ? °Take your Xarelto® ONCE DAILY at the same time every day with your evening meal. °If you have difficulty swallowing the tablet whole, you may crush it and mix in applesauce just prior to taking your dose. ° °Take Xarelto® exactly as prescribed by your doctor and DO NOT stop taking Xarelto® without talking to the doctor who prescribed the medication.  Stopping without other stroke prevention medication to take the place of Xarelto® may increase your risk of developing a clot that causes a stroke.  Refill your prescription before you run out. ° °After discharge, you should have regular check-up appointments with your healthcare provider that is prescribing your Xarelto®.  In the future your dose may need to be changed if your kidney function or weight changes by a significant amount. ° °What do you do if you miss a dose? °If you are taking Xarelto® ONCE DAILY and you miss a dose, take it as soon as you remember on the same day then continue your regularly scheduled once daily regimen the next day. Do not take two doses of Xarelto® at the same time or on the same day.  ° °Important Safety Information °A possible side effect of Xarelto® is bleeding. You should call your healthcare provider right away if you experience any of the following: °? Bleeding from an injury or your nose that does not stop. °? Unusual colored urine (red or dark [place]) or unusual colored stools (red or black). °? Unusual bruising for unknown reasons. °? A serious fall or if you hit your head (even if  there is no bleeding). ° °Some medicines may interact with Xarelto® and might increase your risk of bleeding while on Xarelto®. To help avoid this, consult your healthcare provider or pharmacist prior to using any new prescription or non-prescription medications, including herbals, vitamins, non-steroidal anti-inflammatory drugs (NSAIDs) and supplements. ° °This website has more information on Xarelto®: www.xarelto.com. ° ° °

## 2015-07-05 NOTE — Telephone Encounter (Signed)
Morrie SheldonAshley w/ [facility] wanted to inform Dr. Tanya NonesPickard that he was sent out by EMS @ 10:45 am on 4/23 due to a skin tear that became infected. [facility] Any questions call [phone]

## 2015-07-05 NOTE — Consult Note (Signed)
WOC wound consult note Reason for Consult: Consult requested for left leg skin tear and cellulitis.  Pt fell prior to admission and developed generalized erythremia and edema to surrounding left leg. Son at bedside states pt wore compression wraps prior to admission. Wound type:  Left outer calf with full thickness skin tear; 9.8X8.5X.2cm Wound bed: dark red moist wound bed Drainage (amount, consistency, odor) small amt tan drainage, no odor Periwound: Son states previous edema and erythremia has resolved at this time. Dressing procedure/placement/frequency: Xeroform gauze to promote moist healing, and ace wrap for light compression.  Pt can resume previous compression therapy after discharge.  Discussed plan of care with son at the bedside who denies further questions. Please re-consult if further assistance is needed.  Thank-you,  Cammie Mcgeeawn Makar Slatter MSN, RN, CWOCN, [place], CNS [phone]

## 2015-07-06 DIAGNOSIS — R319 Hematuria, unspecified: Secondary | ICD-10-CM | POA: Diagnosis not present

## 2015-07-06 DIAGNOSIS — I480 Paroxysmal atrial fibrillation: Secondary | ICD-10-CM

## 2015-07-06 DIAGNOSIS — N183 Chronic kidney disease, stage 3 (moderate): Secondary | ICD-10-CM | POA: Diagnosis not present

## 2015-07-06 DIAGNOSIS — L03116 Cellulitis of left lower limb: Secondary | ICD-10-CM | POA: Diagnosis not present

## 2015-07-06 DIAGNOSIS — K5900 Constipation, unspecified: Secondary | ICD-10-CM | POA: Diagnosis not present

## 2015-07-06 MED ORDER — FUROSEMIDE 40 MG PO TABS
40.0000 mg | ORAL_TABLET | Freq: Every day | ORAL | Status: DC
  Administered 2015-07-07: 40 mg via ORAL
  Filled 2015-07-06: qty 1

## 2015-07-06 NOTE — Care Management Note (Signed)
Case Management Note  Patient Details  Name: Earnstine Regalaul L Fawbush MRN: [id number] Date of Birth: 1919-11-22  Subjective/Objective:            Admitted with left lower extremity cellulitis        Action/Plan: PT recommended SNF. Referral to CSW. Will continue to follow.  Expected Discharge Date:                  Expected Discharge Plan:  Skilled Nursing Facility  In-House Referral:  Clinical Social Work  Discharge planning Services  CM Consult  Post Acute Care Choice:    Choice offered to:     DME Arranged:    DME Agency:     HH Arranged:    HH Agency:     Status of Service:  In process, will continue to follow  Medicare Important Message Given:    Date Medicare IM Given:    Medicare IM give by:    Date Additional Medicare IM Given:    Additional Medicare Important Message give by:     If discussed at Long Length of Stay Meetings, dates discussed:    Additional Comments:  Monica BectonKrieg, Alexxus Sobh Watson, RN 07/06/2015, 3:27 PM

## 2015-07-06 NOTE — Clinical Social Work Note (Signed)
CSW spoke with patient's daughter Juventino SlovakJayne Pruitt [phone], who said she would like patient to go to [facility] for short term rehab.  CSW contacted [facility] who said they can take patient on Wednesday.  CSW updated patient's daughter and bedside nurse.  Ervin KnackEric R. Lian Tanori, MSW, Theresia MajorsLCSWA [phone] 07/06/2015 3:47 PM

## 2015-07-06 NOTE — NC FL2 (Signed)
[place] MEDICAID FL2 LEVEL OF CARE SCREENING TOOL     IDENTIFICATION  Patient Name: Jason [name] Birthdate: [birth year] Sex: male Admission Date (Current Location): 07/04/2015  [facility] and [place] Number:  Producer, television/film/video and Address:  [facility], [address] [place], [place] [address]      Provider Number: [id number]  Attending Physician Name and Address:  Standley Brooking, MD  Relative Name and Phone Number:  Pruitt,[name] Daughter [phone]    Current Level of Care: Hospital Recommended Level of Care: Skilled Nursing Facility Prior Approval Number:    Date Approved/Denied:   PASRR Number: [id number]  Discharge Plan: SNF    Current Diagnoses: Patient Active Problem List   Diagnosis Date Noted  . Cellulitis of left lower extremity 07/04/2015  . Hematuria 07/04/2015  . Hemiparesis and alteration of sensations as late effects of stroke (HCC) 03/04/2015  . Hypoxia 02/12/2015  . Essential hypertension 12/22/2014  . Acute ischemic stroke (HCC) 12/22/2014  . HLD (hyperlipidemia)   . Pulmonary vascular congestion 11/17/2014  . Bilateral edema of lower extremity 11/17/2014  . Diastolic CHF, acute (HCC) 11/17/2014  . CHF (congestive heart failure) (HCC) 11/17/2014  . Carotid stenosis 11/02/2014  . Orthostatic hypotension 06/29/2014  . Contusion   . Syncope and collapse 05/22/2014  . Scalp laceration 05/22/2014  . Acute bronchitis 05/22/2014  . Gait disorder 02/04/2014  . Hereditary and idiopathic peripheral neuropathy 02/04/2014  . SOB (shortness of breath) 12/17/2012  . Breast pain 09/08/2012  . Breast lump 09/08/2012  . OA (osteoarthritis) of knee 09/08/2012  . Peripheral edema 09/08/2012  . History of DVT (deep vein thrombosis) 03/02/2012  . Dehydration 03/02/2012  . UTI (lower urinary tract infection) 12/18/2011  . Sepsis (HCC) 12/18/2011  . Transaminitis 12/18/2011  . PAF (paroxysmal atrial fibrillation) (HCC)  12/09/2011  . ARF (acute renal failure) (HCC) 12/05/2011  . Herpes zoster 12/02/2011  . Thrombocytopenia (HCC) 12/02/2011  . CAD (coronary artery disease) 12/02/2011  . CKD (chronic kidney disease), stage III 12/02/2011  . Chronic constipation 10/04/2010  . HYPERTENSION, BENIGN 09/17/2009  . Irritable bowel syndrome 10/15/2007  . VERTIGO 06/25/2007  . ANXIETY 05/27/2007  . PERIPHERAL VASCULAR DISEASE 01/18/2007  . GERD 01/18/2007  . Dyslipidemia 10/03/2006    Orientation RESPIRATION BLADDER Height & Weight     Self    Incontinent Weight: 207 lb 12.8 oz (94.257 kg) Height:   (177.8 cm)  BEHAVIORAL SYMPTOMS/MOOD NEUROLOGICAL BOWEL NUTRITION STATUS      Continent Diet (Carb modified)  AMBULATORY STATUS COMMUNICATION OF NEEDS Skin   Limited Assist Verbally Normal                       Personal Care Assistance Level of Assistance  Bathing, Dressing Bathing Assistance: Limited assistance   Dressing Assistance: Limited assistance     Functional Limitations Info  Sight, Hearing Sight Info: Impaired Hearing Info: Impaired      SPECIAL CARE FACTORS FREQUENCY  PT (By licensed PT)     PT Frequency: 5x a week              Contractures      Additional Factors Info  Allergies, Code Status Code Status Info: DNR Allergies Info: ATIVAN, AUGMENTIN, BELLADONNA ALKALOIDS, CORTICOSTEROIDS, FLUDROCORTISONE ACETATE, PARABENS, SCOPACE           Current Medications (07/06/2015):  This is the current hospital active medication list Current Facility-Administered Medications  Medication Dose Route Frequency  Provider Last Rate Last Dose  . acetaminophen (TYLENOL) tablet 650 mg  650 mg Oral Q6H PRN Meredith PelPaula M Guenther, NP       Or  . acetaminophen (TYLENOL) suppository 650 mg  650 mg Rectal Q6H PRN Meredith PelPaula M Guenther, NP      . acetaminophen (TYLENOL) tablet 1,000 mg  1,000 mg Oral BID Meredith PelPaula M Guenther, NP   1,000 mg at 07/06/15 1108  . albuterol (PROVENTIL) (2.5 MG/3ML)  0.083% nebulizer solution 2.5 mg  2.5 mg Inhalation Q6H PRN Meredith PelPaula M Guenther, NP      . ALPRAZolam Prudy Feeler(XANAX) tablet 0.25 mg  0.25 mg Oral BID PRN Meredith PelPaula M Guenther, NP   0.25 mg at 07/04/15 2231  . alum & mag hydroxide-simeth (MAALOX/MYLANTA) 200-200-20 MG/5ML suspension 30 mL  30 mL Oral Q6H PRN Meredith PelPaula M Guenther, NP   30 mL at 07/05/15 1441  . ampicillin-sulbactam (UNASYN) 1.5 g in sodium chloride 0.9 % 50 mL IVPB  1.5 g Intravenous Q12H Lauren D Bajbus, RPH   1.5 g at 07/06/15 0837  . aspirin EC tablet 81 mg  81 mg Oral Daily Meredith PelPaula M Guenther, NP   81 mg at 07/06/15 1109  . dexlansoprazole (DEXILANT) capsule 60 mg  60 mg Oral Daily Standley Brookinganiel P Goodrich, MD   60 mg at 07/06/15 1110  . DULoxetine (CYMBALTA) DR capsule 30 mg  30 mg Oral Daily Meredith PelPaula M Guenther, NP   30 mg at 07/06/15 1108  . finasteride (PROSCAR) tablet 5 mg  5 mg Oral Daily Meredith PelPaula M Guenther, NP   5 mg at 07/06/15 1108  . [START ON 07/07/2015] furosemide (LASIX) tablet 40 mg  40 mg Oral Daily Standley Brookinganiel P Goodrich, MD      . HYDROcodone-acetaminophen (NORCO/VICODIN) 5-325 MG per tablet 1 tablet  1 tablet Oral Q6H PRN Meredith PelPaula M Guenther, NP   1 tablet at 07/05/15 1633  . ipratropium (ATROVENT) 0.06 % nasal spray 2 spray  2 spray Each Nare QID Meredith PelPaula M Guenther, NP   2 spray at 07/06/15 1113  . latanoprost (XALATAN) 0.005 % ophthalmic solution 1 drop  1 drop Both Eyes QHS Meredith PelPaula M Guenther, NP   1 drop at 07/05/15 2200  . magnesium hydroxide (MILK OF MAGNESIA) suspension 30 mL  30 mL Oral QHS PRN Meredith PelPaula M Guenther, NP      . meclizine (ANTIVERT) tablet 12.5 mg  12.5 mg Oral Q12H PRN Meredith PelPaula M Guenther, NP      . mirabegron ER [facility](MYRBETRIQ) tablet 25 mg  25 mg Oral Daily Meredith PelPaula M Guenther, NP   25 mg at 07/06/15 1108  . nebivolol (BYSTOLIC) tablet 2.5 mg  2.5 mg Oral Daily Meredith PelPaula M Guenther, NP   2.5 mg at 07/06/15 1109  . ondansetron (ZOFRAN) tablet 4 mg  4 mg Oral Q8H PRN Meredith PelPaula M Guenther, NP   4 mg at 07/05/15 1138  . polyethylene glycol (MIRALAX /  GLYCOLAX) packet 17 g  17 g Oral BID Standley Brookinganiel P Goodrich, MD   17 g at 07/06/15 1112  . polyvinyl alcohol (LIQUIFILM TEARS) 1.4 % ophthalmic solution 2 drop  2 drop Both Eyes TID Meredith PelPaula M Guenther, NP   2 drop at 07/06/15 1113  . potassium chloride (K-DUR,KLOR-CON) CR tablet 10 mEq  10 mEq Oral Daily Meredith PelPaula M Guenther, NP   10 mEq at 07/06/15 1109  . Rivaroxaban (XARELTO) tablet 15 mg  15 mg Oral Q supper Lauren D Bajbus, RPH   15 mg at 07/05/15 1746  .  senna-docusate (Senokot-S) tablet 1 tablet  1 tablet Oral QHS PRN Meredith Pel, NP      . simvastatin (ZOCOR) tablet 40 mg  40 mg Oral q1800 Meredith Pel, NP   40 mg at 07/05/15 1746     Discharge Medications: Please see discharge summary for a list of discharge medications.  Relevant Imaging Results:  Relevant Lab Results:   Additional Information SSN [id number]  Darleene Cleaver, [place]

## 2015-07-06 NOTE — Progress Notes (Signed)
PROGRESS NOTE  Jason [name] L Hamstra [id number] DOB: 04-26-1919 DOA: 07/04/2015 PCP: Leo GrosserPICKARD,Jason TOM, MD  Brief Narrative: 82 year old man presented to the emergency department with increased erythema after a skin tear earlier in the week. He was admitted for cellulitis.  Assessment/Plan: 1. Cellulitis left lower extremity.ontinues to improve with regression from marked border. 2. CKD stage III, at baseline. 3. Hematuria of unclear significance. Urine culture unrevealing. Follow clinically. 4. Chronic diastolic CHF.remains stable. 5. PMH DVT 6. Atrial fibrillation 7. Chronic constipation, improving.   Continues to make progress. Continue IV antibiotics today, likely transition to oral the next 48 hours.  Continue aggressive bowel regimen  esume Lasix4/26  Anticipated transfer skilled nursing facility, likely next 24 hours  DVT prophylaxis: Xarelto Code Status: DNR Family Communication: discussed in detail with son at bedside   Disposition Plan: SNF?  [name], MD  [facility] Direct contact:  --Via amion app OR  --[email or web address]; password [email or web address] and click  7PM-7AM contact night coverage as above 07/06/2015, 11:44 AM  LOS: 2 days   Consultants:  PT SNF  Procedures:    Antimicrobials:  Unasyn 4/23 >>  HPI/Subjective: No complaints in regard to leg.Some bowel movements.no significant abdominal pain. Son at bedside.  Nurse reported blood tinged urine  Objective: Filed Vitals:   07/05/15 1522 07/05/15 2113 07/06/15 0042 07/06/15 0615  BP: 87/51 114/65 125/89 120/55  Pulse: 82 94 92 94  Temp: 97.9 F (36.6 C) 99 F (37.2 C) 98.5 F (36.9 C) 98.5 F (36.9 C)  TempSrc: Oral Oral Oral Oral  Resp: 16 18 18 18   Height:      Weight:      SpO2:  94% 94% 98%    Intake/Output Summary (Last 24 hours) at 07/06/15 1144 Last data filed at 07/05/15 2259  Gross per 24 hour  Intake      0 ml  Output    325 ml  Net   -325 ml     Filed Weights   07/04/15 1155  Weight: 94.257 kg (207 lb 12.8 oz)    Exam: Constitutional:  . Appears calm and comfortable sitting in chair Respiratory:  . CTA bilaterally, no w/r/r.  . Respiratory effort normal. No retractions or accessory muscle use Cardiovascular:  . RRR, no m/r/g Abdomen:  . Abdomen distended, tympanic to percussion, nontender Skin:  . Erythema continues to regress left lower extremity, foot appears uninvolved. Psychiatric:  . Mental status o Mood, affect appropriate follows commands  I have personally reviewed following labs and imaging studies: Urine culture multiple species.  Scheduled Meds: . acetaminophen  1,000 mg Oral BID  . ampicillin-sulbactam (UNASYN) IV  1.5 g Intravenous Q12H  . aspirin EC  81 mg Oral Daily  . dexlansoprazole  60 mg Oral Daily  . DULoxetine  30 mg Oral Daily  . finasteride  5 mg Oral Daily  . ipratropium  2 spray Each Nare QID  . latanoprost  1 drop Both Eyes QHS  . mirabegron ER  25 mg Oral Daily  . nebivolol  2.5 mg Oral Daily  . polyethylene glycol  17 g Oral BID  . polyvinyl alcohol  2 drop Both Eyes TID  . potassium chloride  10 mEq Oral Daily  . rivaroxaban  15 mg Oral Q supper  . simvastatin  40 mg Oral q1800   Continuous Infusions:   Principal Problem:   Cellulitis of left lower extremity Active Problems:   GERD   Chronic constipation   CKD (chronic kidney disease), stage  III   PAF (paroxysmal atrial fibrillation) (HCC)   Peripheral edema   Essential hypertension   Hematuria   LOS: 2 days   Time spent 20 minutes

## 2015-07-06 NOTE — Care Management Obs Status (Signed)
MEDICARE OBSERVATION STATUS NOTIFICATION   Patient Details  Name: Jason [name] MRN: [id number] Date of Birth: 1919-12-05   Medicare Observation Status Notification Given:  Yes    Monica BectonKrieg, Alivea Gladson Watson, RN 07/06/2015, 3:59 PM

## 2015-07-06 NOTE — Progress Notes (Signed)
Pt's urine appears blood-tinged. Paged Dr. Irene LimboGoodrich with update who called back and stated this was pt's baseline and to continue to monitor. Pt is resting comfortably and denies any pain/SOB or other symptoms. Will continue to monitor

## 2015-07-07 ENCOUNTER — Telehealth: Payer: Self-pay | Admitting: *Deleted

## 2015-07-07 DIAGNOSIS — R262 Difficulty in walking, not elsewhere classified: Secondary | ICD-10-CM | POA: Diagnosis not present

## 2015-07-07 DIAGNOSIS — N179 Acute kidney failure, unspecified: Secondary | ICD-10-CM | POA: Diagnosis not present

## 2015-07-07 DIAGNOSIS — R140 Abdominal distension (gaseous): Secondary | ICD-10-CM | POA: Diagnosis not present

## 2015-07-07 DIAGNOSIS — H9191 Unspecified hearing loss, right ear: Secondary | ICD-10-CM | POA: Diagnosis not present

## 2015-07-07 DIAGNOSIS — J209 Acute bronchitis, unspecified: Secondary | ICD-10-CM | POA: Diagnosis not present

## 2015-07-07 DIAGNOSIS — Z955 Presence of coronary angioplasty implant and graft: Secondary | ICD-10-CM | POA: Diagnosis not present

## 2015-07-07 DIAGNOSIS — R55 Syncope and collapse: Secondary | ICD-10-CM | POA: Diagnosis not present

## 2015-07-07 DIAGNOSIS — I10 Essential (primary) hypertension: Secondary | ICD-10-CM | POA: Diagnosis not present

## 2015-07-07 DIAGNOSIS — I5030 Unspecified diastolic (congestive) heart failure: Secondary | ICD-10-CM | POA: Diagnosis not present

## 2015-07-07 DIAGNOSIS — Z7901 Long term (current) use of anticoagulants: Secondary | ICD-10-CM | POA: Diagnosis not present

## 2015-07-07 DIAGNOSIS — L03116 Cellulitis of left lower limb: Secondary | ICD-10-CM | POA: Diagnosis not present

## 2015-07-07 DIAGNOSIS — I480 Paroxysmal atrial fibrillation: Secondary | ICD-10-CM | POA: Diagnosis not present

## 2015-07-07 DIAGNOSIS — R109 Unspecified abdominal pain: Secondary | ICD-10-CM | POA: Diagnosis not present

## 2015-07-07 DIAGNOSIS — M6281 Muscle weakness (generalized): Secondary | ICD-10-CM | POA: Diagnosis not present

## 2015-07-07 DIAGNOSIS — R1314 Dysphagia, pharyngoesophageal phase: Secondary | ICD-10-CM | POA: Diagnosis not present

## 2015-07-07 DIAGNOSIS — W19XXXA Unspecified fall, initial encounter: Secondary | ICD-10-CM | POA: Diagnosis not present

## 2015-07-07 DIAGNOSIS — E785 Hyperlipidemia, unspecified: Secondary | ICD-10-CM | POA: Diagnosis not present

## 2015-07-07 DIAGNOSIS — I509 Heart failure, unspecified: Secondary | ICD-10-CM | POA: Diagnosis not present

## 2015-07-07 DIAGNOSIS — R319 Hematuria, unspecified: Secondary | ICD-10-CM | POA: Diagnosis not present

## 2015-07-07 DIAGNOSIS — Z86718 Personal history of other venous thrombosis and embolism: Secondary | ICD-10-CM | POA: Diagnosis not present

## 2015-07-07 DIAGNOSIS — I5032 Chronic diastolic (congestive) heart failure: Secondary | ICD-10-CM | POA: Diagnosis not present

## 2015-07-07 DIAGNOSIS — K210 Gastro-esophageal reflux disease with esophagitis: Secondary | ICD-10-CM | POA: Diagnosis not present

## 2015-07-07 DIAGNOSIS — Z86711 Personal history of pulmonary embolism: Secondary | ICD-10-CM | POA: Diagnosis not present

## 2015-07-07 DIAGNOSIS — I2510 Atherosclerotic heart disease of native coronary artery without angina pectoris: Secondary | ICD-10-CM | POA: Diagnosis not present

## 2015-07-07 DIAGNOSIS — I4891 Unspecified atrial fibrillation: Secondary | ICD-10-CM | POA: Diagnosis not present

## 2015-07-07 DIAGNOSIS — K5900 Constipation, unspecified: Secondary | ICD-10-CM | POA: Diagnosis not present

## 2015-07-07 DIAGNOSIS — N200 Calculus of kidney: Secondary | ICD-10-CM | POA: Diagnosis not present

## 2015-07-07 DIAGNOSIS — K5909 Other constipation: Secondary | ICD-10-CM | POA: Diagnosis not present

## 2015-07-07 DIAGNOSIS — I252 Old myocardial infarction: Secondary | ICD-10-CM | POA: Diagnosis not present

## 2015-07-07 DIAGNOSIS — R0602 Shortness of breath: Secondary | ICD-10-CM | POA: Diagnosis not present

## 2015-07-07 DIAGNOSIS — Z79899 Other long term (current) drug therapy: Secondary | ICD-10-CM | POA: Diagnosis not present

## 2015-07-07 DIAGNOSIS — M1990 Unspecified osteoarthritis, unspecified site: Secondary | ICD-10-CM | POA: Diagnosis not present

## 2015-07-07 DIAGNOSIS — I130 Hypertensive heart and chronic kidney disease with heart failure and stage 1 through stage 4 chronic kidney disease, or unspecified chronic kidney disease: Secondary | ICD-10-CM | POA: Diagnosis not present

## 2015-07-07 DIAGNOSIS — I69359 Hemiplegia and hemiparesis following cerebral infarction affecting unspecified side: Secondary | ICD-10-CM | POA: Diagnosis not present

## 2015-07-07 DIAGNOSIS — N183 Chronic kidney disease, stage 3 (moderate): Secondary | ICD-10-CM | POA: Diagnosis not present

## 2015-07-07 DIAGNOSIS — S81812A Laceration without foreign body, left lower leg, initial encounter: Secondary | ICD-10-CM | POA: Diagnosis not present

## 2015-07-07 DIAGNOSIS — R600 Localized edema: Secondary | ICD-10-CM | POA: Diagnosis not present

## 2015-07-07 DIAGNOSIS — Z7982 Long term (current) use of aspirin: Secondary | ICD-10-CM | POA: Diagnosis not present

## 2015-07-07 DIAGNOSIS — R278 Other lack of coordination: Secondary | ICD-10-CM | POA: Diagnosis not present

## 2015-07-07 DIAGNOSIS — L03119 Cellulitis of unspecified part of limb: Secondary | ICD-10-CM | POA: Diagnosis not present

## 2015-07-07 DIAGNOSIS — I739 Peripheral vascular disease, unspecified: Secondary | ICD-10-CM | POA: Diagnosis not present

## 2015-07-07 DIAGNOSIS — R42 Dizziness and giddiness: Secondary | ICD-10-CM | POA: Diagnosis not present

## 2015-07-07 LAB — CBC
HCT: 35.7 % — ABNORMAL LOW (ref 39.0–52.0)
HEMOGLOBIN: 10.8 g/dL — AB (ref 13.0–17.0)
MCH: 29 pg (ref 26.0–34.0)
MCHC: 30.3 g/dL (ref 30.0–36.0)
MCV: 96 fL (ref 78.0–100.0)
Platelets: 221 10*3/uL (ref 150–400)
RBC: 3.72 MIL/uL — ABNORMAL LOW (ref 4.22–5.81)
RDW: 16.1 % — ABNORMAL HIGH (ref 11.5–15.5)
WBC: 9.2 10*3/uL (ref 4.0–10.5)

## 2015-07-07 LAB — CREATININE, SERUM
CREATININE: 2.39 mg/dL — AB (ref 0.61–1.24)
GFR calc Af Amer: 25 mL/min — ABNORMAL LOW (ref >60)
GFR calc non Af Amer: 22 mL/min — ABNORMAL LOW (ref >60)

## 2015-07-07 MED ORDER — CEFUROXIME AXETIL 500 MG PO TABS
500.0000 mg | ORAL_TABLET | ORAL | Status: DC
  Administered 2015-07-07: 500 mg via ORAL
  Filled 2015-07-07: qty 1

## 2015-07-07 MED ORDER — HYDROCODONE-ACETAMINOPHEN 5-325 MG PO TABS: 1.0000 | ORAL_TABLET | Freq: Four times a day (QID) | ORAL | Status: DC | PRN

## 2015-07-07 MED ORDER — POLYETHYLENE GLYCOL 3350 17 G PO PACK: 17.0000 g | PACK | Freq: Two times a day (BID) | ORAL | Status: AC

## 2015-07-07 MED ORDER — CEFUROXIME AXETIL 500 MG PO TABS: 500.0000 mg | ORAL_TABLET | Freq: Every day | ORAL | Status: DC

## 2015-07-07 MED ORDER — ALPRAZOLAM 0.25 MG PO TABS: 0.2500 mg | ORAL_TABLET | Freq: Two times a day (BID) | ORAL | Status: DC | PRN

## 2015-07-07 NOTE — Consult Note (Signed)
   [facility]   07/07/2015  Earnstine Regalaul L Hird 12/14/1919 [id number]    [facility]. Chart reviewed. Noted discharge plan is for SNF.  There are no identifiable [facility] needs at this time. If patient's post hospital needs change, please place a [facility] consult. For questions please contact:  Raiford Nobletika Hall, MSN-Ed, RN,BSN [facility] Care Management Hospital Liaison [phone]

## 2015-07-07 NOTE — Clinical Social Work Note (Signed)
Patient to be d/c'ed today to [facility].  Patient and family agreeable to plans will transport via ems RN to call report to [phone] Room 204.  Windell MouldingEric Milana Salay, MSW, Theresia MajorsLCSWA [phone]

## 2015-07-07 NOTE — Discharge Summary (Signed)
Physician Discharge Summary  Jason [name] RUE:[id number] DOB: 06/05/1919 DOA: 07/04/2015  PCP: Leo Grosser, MD  Admit date: 07/04/2015 Discharge date: 07/07/2015  Recommendations for Outpatient Follow-up:  1. Resolution of LLE cellulitis  2. LLE wound care: Dressing procedure/placement/frequency: Xeroform gauze to promote moist healing, and ace wrap for light compression. Pt can resume previous compression therapy after discharge. 3. Hematuria, likely from nonobstructing nephrolithiasis as seen on previous CT imaging. CT imaging also suggests the possibility of bladder outlet obstruction. Consider outpatient follow-up with urology as needed. 4. Chronic constipation, started on MiraLAX twice daily, senna in the evening. Consider Dulcolax suppository each morning as needed. Goal 1-2 large bowel movements per day.   Follow-up Information    Follow up with [facility] TOM, MD. Schedule an appointment as soon as possible for a visit in 2 weeks.   Specialty:  Family Medicine   Contact information:   [address] [place] [address] [phone]      Discharge Diagnoses:  1. Cellulitis left lower extremity secondary to skin tear present on admission 2. Left lower extremity skin tear present on admission 3. Chronic kidney disease stage III 4. Hematuria, nonobstructing nephrolithiasis 5. Chronic diastolic congestive heart failure 6. Atrial fibrillation  7. Chronic constipation  Discharge Condition: improved Disposition: SNF for short term rehab  Diet recommendation: heart healthy  Filed Weights   07/04/15 1155  Weight: 94.257 kg (207 lb 12.8 oz)    History of present illness:  82 year old man presented to the emergency department with increased erythema after a skin tear earlier in the week. He was admitted for cellulitis.  Hospital Course:  Patient was admitted, placed on empiric IV antibiotics with rapid improvement in left lower extremity  erythema/cellulitis. Seen by wound care with recommendations as above. Also noted to have some hematuria likely related to nonobstructive nephrolithiasis. Chronic comorbidities including chronic kidney disease, diastolic CHF, atrial fibrillation remained stable. Long-standing constipation by history stable, have recommended aggressive bowel regimen. He was evaluated by physical and occupational therapy with recommendations for short-term skilled nursing facility rehabilitation. Hospitalization was uncomplicated.  1. Cellulitis left lower extremity. Continues to improve. 2. CKD stage III, remains at baseline. 3. Hematuria of unclear significance. Urine culture unrevealing. Plan to complete abx and suggest repeat U/A as outpt and can consider outpatient f/u with urology if not resolved. 4. Chronic diastolic CHF. No evidence of volume overload. 5. PMH DVT 6. Atrial fibrillation 7. Chronic constipation.  Consultants:  PT SNF  Antimicrobials:  Unasyn 4/23 >> 4/26  Ceftin 4/26 >> 4/29  Discharge Instructions   Current Discharge Medication List    START taking these medications   Details  cefUROXime (CEFTIN) 500 MG tablet Take 1 tablet (500 mg total) by mouth daily. Last day 4/29    polyethylene glycol (MIRALAX / GLYCOLAX) packet Take 17 g by mouth 2 (two) times daily.      CONTINUE these medications which have CHANGED   Details  ALPRAZolam (XANAX) 0.25 MG tablet Take 1 tablet (0.25 mg total) by mouth 2 (two) times daily as needed for anxiety. Qty: 10 tablet, Refills: 0    HYDROcodone-acetaminophen (NORCO/VICODIN) 5-325 MG tablet Take 1 tablet by mouth every 6 (six) hours as needed for moderate pain. Qty: 10 tablet, Refills: 0      CONTINUE these medications which have NOT CHANGED   Details  acetaminophen (TYLENOL) 500 MG tablet Take 1,000 mg by mouth 2 (two) times daily. Take every day per [facility]    albuterol (  PROVENTIL HFA;VENTOLIN HFA) 108 (90 Base) MCG/ACT inhaler Inhale 1-2  puffs into the lungs every 6 (six) hours as needed for wheezing or shortness of breath. Qty: 1 Inhaler, Refills: 0    aspirin EC 81 MG EC tablet Take 1 tablet (81 mg total) by mouth daily.    DEXILANT 60 MG capsule TAKE ONE CAPSULE BY MOUTH EVERY DAY Qty: 30 capsule, Refills: 11    dextromethorphan-guaiFENesin (ROBITUSSIN COLD COUGH+ CHEST) 10-100 MG/5ML liquid Take 10 mLs by mouth every 4 (four) hours as needed for cough.    doxazosin (CARDURA) 4 MG tablet Take 1 tablet (4 mg total) by mouth daily. Qty: 30 tablet, Refills: 3    DULoxetine (CYMBALTA) 30 MG capsule TAKE ONE CAPSULE BY MOUTH EVERY DAY Qty: 30 capsule, Refills: 6    finasteride (PROSCAR) 5 MG tablet Take 1 tablet (5 mg total) by mouth daily. Qty: 30 tablet, Refills: 0    furosemide (LASIX) 40 MG tablet TAKE 1 TABLET(40 MG) BY MOUTH DAILY Qty: 90 tablet, Refills: 0    hydrALAZINE (APRESOLINE) 50 MG tablet Take 1 tablet (50 mg total) by mouth 3 (three) times daily. Qty: 90 tablet, Refills: 0    ipratropium (ATROVENT) 0.06 % nasal spray Place 2 sprays into both nostrils 4 (four) times daily. Qty: 15 mL, Refills: 0    latanoprost (XALATAN) 0.005 % ophthalmic solution Place 1 drop into both eyes at bedtime.    meclizine (ANTIVERT) 12.5 MG tablet Take 12.5 mg by mouth every 12 (twelve) hours as needed for dizziness.    MYRBETRIQ 25 MG TB24 tablet TAKE 1 TABLET BY MOUTH EVERY DAY Qty: 30 tablet, Refills: 11    nebivolol (BYSTOLIC) 2.5 MG tablet Take 1 tablet (2.5 mg total) by mouth daily. Qty: 30 tablet, Refills: 0    nitroGLYCERIN (NITROSTAT) 0.4 MG SL tablet Place 0.4 mg under the tongue every 5 (five) minutes as needed. Chest pain    NON FORMULARY Place 2 L into the nose at bedtime.    ondansetron (ZOFRAN) 4 MG tablet Take 4 mg by mouth every 8 (eight) hours as needed for nausea.    polyvinyl alcohol (LIQUIFILM TEARS) 1.4 % ophthalmic solution Place 2 drops into both eyes 3 (three) times daily.    potassium  chloride (K-DUR,KLOR-CON) 10 MEQ tablet Take 10 mEq by mouth daily.    senna-docusate (SENOKOT-S) 8.6-50 MG tablet Take 1 tablet by mouth at bedtime as needed for mild constipation. Qty: 30 tablet, Refills: 0    simvastatin (ZOCOR) 40 MG tablet Take 1 tablet (40 mg total) by mouth daily at 6 PM.    XARELTO 15 MG TABS tablet TAKE 1 TABLET BY MOUTH EVERY DAY WITH SUPPER Qty: 30 tablet, Refills: 6      STOP taking these medications     alum & mag hydroxide-simeth (MAALOX/MYLANTA) 200-200-20 MG/5ML suspension      docusate sodium (COLACE) 100 MG capsule      famotidine (PEPCID) 20 MG tablet      guaiFENesin 200 MG tablet      lactulose, encephalopathy, (CHRONULAC) 10 GM/15ML SOLN      loperamide (IMODIUM) 2 MG capsule      magnesium hydroxide (MILK OF MAGNESIA) 400 MG/5ML suspension      polyethylene glycol powder (GLYCOLAX/MIRALAX) powder        Allergies  Allergen Reactions  . Ativan [Lorazepam] Other (See Comments)    unknown  . Augmentin [Amoxicillin-Pot Clavulanate] Nausea And Vomiting    daughter states he can tolerate  Amoxicillin ok Profuse vomiting. Unable to answer penicillin questions as pt is from Nursing home and unable to provide answers to questions  . Belladonna Alkaloids     Unknown- on MAR  . Corticosteroids     Unknown- on MAR  . Fludrocortisone Acetate Other (See Comments)    "leg swelling"  . Parabens     Unknown- on MAR  . Scopace [Scopolamine] Other (See Comments)    *patch "Made him crazy" per patients daughter     The results of significant diagnostics from this hospitalization (including imaging, microbiology, ancillary and laboratory) are listed below for reference.    Significant Diagnostic Studies: Ct Abdomen Pelvis Wo Contrast  06/23/2015  CLINICAL DATA:  Abdominal pain and bloating for the past 2-3 months. Fluid retention. History of CHF. EXAM: CT ABDOMEN AND PELVIS WITHOUT CONTRAST TECHNIQUE: Multidetector CT imaging of the abdomen  and pelvis was performed following the standard protocol without IV contrast. COMPARISON:  CT abdomen pelvis - 03/31/2014; 07/26/2012 FINDINGS: The lack of intravenous contrast limits the ability to evaluate solid abdominal organs. Lower chest: Limited visualization of the lower thorax is degraded secondary to patient respiratory artifact. There is mild diffuse interstitial thickening within the imaged bilateral lung bases. No pleural effusion. Cardiomegaly. There is diffuse decreased attenuation of the intra cardiac blood pool suggestive of anemia. Coronary artery calcifications. Calcifications within the aortic valve leaflets. Trace amount of pericardial fluid, presumably physiologic. Hepatobiliary: Normal hepatic contour. Normal noncontrast appearance of the gallbladder given degree distention. No radiopaque gallstones. No ascites. Pancreas: The pancreas appears slightly atrophic. Spleen: Normal noncontrast appearance of the spleen. Adrenals/Urinary Tract: The bilateral kidneys appear atrophic, left greater than right. There is a punctate (approximately 2 mm) nonobstructing stone within the superior pole of the right kidney (coronal image 119, series 603 as well as a punctate (approximately 2 mm) stone within the superior pole the left kidney (coronal image 111). There are likely 2 additional punctate (1-2 mm) nonobstructing stones within the inferior pole the right kidney. No renal stones are seen along the expected course of either ureter or the urinary bladder. Minimal amount of likely age and body habitus related perinephric stranding. No urinary obstruction. The urinary bladder appears slightly patulous with suspected mild diffuse bladder wall thickening (representative sagittal image 106, series 602). Stomach/Bowel: Ingested enteric contrast extends to the level of the distal small bowel. Moderate colonic stool burden without evidence of enteric obstruction. All normal noncontrast appearance of the terminal  ileum and retrocecal appendix. No pneumoperitoneum, pneumatosis or portal venous gas. Small hiatal hernia. Vascular/Lymphatic: Scattered atherosclerotic plaque within a tortuous but normal caliber abdominal aorta. Post IVC filter placement. No bulky retroperitoneal, mesenteric, pelvic or inguinal lymphadenopathy on this noncontrast examination. Reproductive: Normal noncontrast appearance of the pelvic organs. No free fluid in the pelvic cul-de-sac. Multiple phleboliths are seen with the lower pelvis bilaterally. Other: Mild diffuse body wall anasarca, most conspicuous about the midline of the low back. Small bilateral mesenteric fat containing inguinal hernias, left greater than right. Tiny mesenteric fat containing periumbilical hernia. Musculoskeletal: No acute or aggressive osseous abnormalities. There is straightening and reversal the expected lumbar lordosis with mild kyphosis centered about the L1-L2 intervertebral disc space. Moderate severe multilevel lumbar spine DDD, worse at L2-L3 and L4-L5 with disc space height loss, endplate irregularity and sclerosis. Mild degenerative change of the bilateral hips. IMPRESSION: 1. No explanation for patient's abdominal pain and bloating. Specifically, no evidence of enteric or urinary obstruction. 2. Bilateral nonobstructing nephrolithiasis. 3. Patulous appearance  of the urinary bladder with suspected mild urinary bladder wall thickening. Correlation for symptoms of bladder outlet obstructive symptoms is recommended. 4. Cardiomegaly. 5. Atherosclerosis including coronary artery calcifications. Electronically Signed   By: Simonne Come M.D.   On: 06/23/2015 12:32   Dg Chest 2 View  07/04/2015  CLINICAL DATA:  Pt c/o SOB and nausea x 3 weeks. Hx of CAD and PE. Pt also c/o lateral right hip pain x 3 days. No injury. Nx of prior injuries or surgeries. EXAM: CHEST  2 VIEW COMPARISON:  05/04/2015 FINDINGS: Heart is mildly enlarged. The lungs are free of focal consolidations  and pleural effusions. No pulmonary edema. Visualized osseous structures have a normal appearance. IMPRESSION: Mild cardiomegaly.  No evidence for acute pulmonary abnormality. Electronically Signed   By: Norva Pavlov M.D.   On: 07/04/2015 13:17   Dg Pelvis 1-2 Views  07/04/2015  CLINICAL DATA:  Pt c/o SOB and nausea x 3 weeks. Hx of CAD and PE. Pt also c/o lateral right hip pain x 3 days. No injury. Nx of prior injuries or surgeries. EXAM: PELVIS - 1-2 VIEW COMPARISON:  CT 06/23/2015 FINDINGS: Mild degenerative changes in bilateral hips with mild narrowing of the articular cartilage and early marginal spur formation. There is some subchondral sclerosis in both femoral heads right greater than left. No fracture or dislocation. Degenerative disc disease evident in the visualized lower lumbar spine. Bilateral pelvic phleboliths. Pelvic ring and sacrum appear intact. IMPRESSION: 1. Degenerative changes in bilateral hips and lower lumbar spine. 2. Negative for fracture or other acute bone abnormality Electronically Signed   By: Corlis Leak M.D.   On: 07/04/2015 13:07   Dg Abd 2 Views  07/05/2015  CLINICAL DATA:  Abdominal pain and distension EXAM: ABDOMEN - 2 VIEW COMPARISON:  06/23/2015 FINDINGS: Scattered large and small bowel gas is noted. Mild retained fecal material is seen. An IVC filter is noted. No free air is seen. No acute bony abnormality is noted. Ovoid density noted over the left mid abdomen likely related to ingested material. This was not present on the recent CT examination. IMPRESSION: No acute abnormality noted. Electronically Signed   By: Alcide Clever M.D.   On: 07/05/2015 14:31    Microbiology: Recent Results (from the past 240 hour(s))  Urine culture     Status: None   Collection Time: 07/04/15 11:45 AM  Result Value Ref Range Status   Specimen Description URINE, RANDOM  Final   Special Requests NONE  Final   Culture MULTIPLE SPECIES PRESENT, SUGGEST RECOLLECTION  Final   Report  Status 07/05/2015 FINAL  Final  Culture, blood (routine x 2)     Status: None (Preliminary result)   Collection Time: 07/04/15  3:45 PM  Result Value Ref Range Status   Specimen Description LEFT ANTECUBITAL  Final   Special Requests BOTTLES DRAWN AEROBIC AND ANAEROBIC 10CC  Final   Culture NO GROWTH 2 DAYS  Final   Report Status PENDING  Incomplete  Culture, blood (routine x 2)     Status: None (Preliminary result)   Collection Time: 07/04/15  4:00 PM  Result Value Ref Range Status   Specimen Description BLOOD RIGHT HAND  Final   Special Requests BOTTLES DRAWN AEROBIC AND ANAEROBIC 10CC  Final   Culture NO GROWTH 2 DAYS  Final   Report Status PENDING  Incomplete  MRSA PCR Screening     Status: None   Collection Time: 07/05/15  3:14 PM  Result Value Ref Range  Status   MRSA by PCR NEGATIVE NEGATIVE Final    Comment:        The GeneXpert MRSA Assay (FDA approved for NASAL specimens only), is one component of a comprehensive MRSA colonization surveillance program. It is not intended to diagnose MRSA infection nor to guide or monitor treatment for MRSA infections.      Labs: Basic Metabolic Panel:  Recent Labs Lab 07/01/15 1441 07/04/15 1125 07/05/15 0252 07/07/15 0530  NA 136 141 140  --   K 4.4 3.8 3.3*  --   CL 102 103 100*  --   CO2 30 27 26   --   GLUCOSE 118* 107* 86  --   BUN 44* 38* 37*  --   CREATININE 2.25* 2.18* 2.14* 2.39*  CALCIUM 8.5* 8.7* 8.4*  --    Liver Function Tests:  Recent Labs Lab 07/04/15 1125  AST 17  ALT 16*  ALKPHOS 49  BILITOT 0.8  PROT 6.8  ALBUMIN 2.9*   CBC:  Recent Labs Lab 07/04/15 1125 07/05/15 0252 07/07/15 0530  WBC 8.8 8.3 9.2  NEUTROABS 5.6  --   --   HGB 11.8* 11.0* 10.8*  HCT 37.3* 35.5* 35.7*  MCV 95.4 94.9 96.0  PLT 202 207 221     Recent Labs  11/16/14 1942 07/04/15 1125  BNP 598.4* 353.1*    Principal Problem:   Cellulitis of left lower extremity Active Problems:   GERD   Chronic  constipation   CKD (chronic kidney disease), stage III   PAF (paroxysmal atrial fibrillation) (HCC)   Peripheral edema   Essential hypertension   Hematuria   Time coordinating discharge: 25 minutes  Signed:  Brendia Sacksaniel Andrae Claunch, MD [facility] 07/07/2015, 9:47 AM

## 2015-07-07 NOTE — Progress Notes (Signed)
PROGRESS NOTE  Jason [name] ZOX:[id number] DOB: [birth year] DOA: 07/04/2015 PCP: Leo GrosserPICKARD,[name], MD  Brief Narrative: 82 year old man presented to the emergency department with increased erythema after a skin tear earlier in the week. He was admitted for cellulitis.  Assessment/Plan: 1. Cellulitis left lower extremity. Continues to improve. 2. CKD stage III, remains at baseline. 3. Hematuria of unclear significance. Urine culture unrevealing. Plan to complete abx and suggest repeat U/A as outpt and can consider outpatient f/u with urology if not resolved. 4. Chronic diastolic CHF. No evidence of volume overload. 5. PMH DVT 6. Atrial fibrillation 7. Chronic constipation.    Much improved.  Change to oral abx and transfer to SNF today for short term rehab.  Continue aggressive bowel regimen with goal 1-2 BM per day.  Suggest repeat u/a after abx complete and consider urology evaluation if hematuria not resolved.  DVT prophylaxis: Xarelto Code Status: DNR Family Communication: discussed with daughter [name] by telephone, agrees with plan Disposition Plan: SNF   Brendia Sacksaniel Goodrich, MD  [facility] Direct contact:  --Via amion app OR  --[email or web address]; password [email or web address] and click  7PM-7AM contact night coverage as above 07/07/2015, 9:29 AM  LOS: 3 days   Consultants:  PT SNF  Procedures:    Antimicrobials:  Unasyn 4/23 >> 4/26  Ceftin 4/26 >> 4/29  HPI/Subjective: Feels ok.  Objective: Filed Vitals:   07/06/15 0615 07/06/15 1300 07/06/15 2134 07/07/15 0522  BP: 120/55 111/62 133/75 142/74  Pulse: 94 89 72 78  Temp: 98.5 F (36.9 C) 97.4 F (36.3 C) 97.8 F (36.6 C) 98.6 F (37 C)  TempSrc: Oral  Oral   Resp: 18 18 18 18   Height:      Weight:      SpO2: 98% 95% 96% 96%    Intake/Output Summary (Last 24 hours) at 07/07/15 0929 Last data filed at 07/07/15 0649  Gross per 24 hour  Intake    720 ml  Output    400 ml  Net    320 ml     Filed  Weights   07/04/15 1155  Weight: 94.257 kg (207 lb 12.8 oz)    Exam: Constitutional:  . Appears calm and comfortable sitting in chair Respiratory:  . CTA bilaterally, no w/r/r.  . Respiratory effort normal. No retractions or accessory muscle use Cardiovascular:  . RRR, no m/r/g . No LE extremity edema   Abdomen:  . Abdomen distended, tympanic, normal bowel sounds; no tenderness or masses Skin:  . LLE skin tear and abrasion healing, surrounding erythema nearly resolved. Foot appears unremarkable. . palpation of skin: no induration or nodules Psychiatric:  . Mental status o Mood, affect appropriate  Scheduled Meds: . acetaminophen  1,000 mg Oral BID  . ampicillin-sulbactam (UNASYN) IV  1.5 g Intravenous Q12H  . aspirin EC  81 mg Oral Daily  . dexlansoprazole  60 mg Oral Daily  . DULoxetine  30 mg Oral Daily  . finasteride  5 mg Oral Daily  . furosemide  40 mg Oral Daily  . ipratropium  2 spray Each Nare QID  . latanoprost  1 drop Both Eyes QHS  . mirabegron ER  25 mg Oral Daily  . nebivolol  2.5 mg Oral Daily  . polyethylene glycol  17 g Oral BID  . polyvinyl alcohol  2 drop Both Eyes TID  . potassium chloride  10 mEq Oral Daily  . rivaroxaban  15 mg Oral Q supper  . simvastatin  40 mg Oral q1800  Continuous Infusions:   Principal Problem:   Cellulitis of left lower extremity Active Problems:   GERD   Chronic constipation   CKD (chronic kidney disease), stage III   PAF (paroxysmal atrial fibrillation) (HCC)   Peripheral edema   Essential hypertension   Hematuria   LOS: 3 days

## 2015-07-07 NOTE — Telephone Encounter (Signed)
Pt was on TCM list was admitted for LLE cellulitis. D/C 4/26 will have f/u appt w/Dr. Tanya NonesPickard in 2 wks...[name]/lmb

## 2015-07-09 LAB — CULTURE, BLOOD (ROUTINE X 2)
CULTURE: NO GROWTH
Culture: NO GROWTH

## 2015-07-17 DIAGNOSIS — R140 Abdominal distension (gaseous): Secondary | ICD-10-CM | POA: Diagnosis not present

## 2015-07-17 DIAGNOSIS — R600 Localized edema: Secondary | ICD-10-CM | POA: Diagnosis not present

## 2015-07-29 ENCOUNTER — Telehealth: Payer: Self-pay | Admitting: Family Medicine

## 2015-07-29 NOTE — Telephone Encounter (Signed)
Cindy w/ [facility] is calling to request a faxed order for pt to have oxygen a.m. and PRN p.m. [facility] @ [phone]

## 2015-07-30 NOTE — Telephone Encounter (Signed)
ok 

## 2015-07-30 NOTE — Telephone Encounter (Signed)
Ok to order 

## 2015-07-30 NOTE — Telephone Encounter (Signed)
[facility]   LMTRC.

## 2015-08-02 ENCOUNTER — Encounter: Payer: Self-pay | Admitting: *Deleted

## 2015-08-02 NOTE — Telephone Encounter (Signed)
[facility]   Reports that patient has PRN order for AM O2 for SOB.    Requested order for PM.   Faxed order to facility.

## 2015-08-04 ENCOUNTER — Telehealth: Payer: Self-pay | Admitting: Family Medicine

## 2015-08-04 ENCOUNTER — Encounter (HOSPITAL_COMMUNITY): Payer: Self-pay | Admitting: Emergency Medicine

## 2015-08-04 ENCOUNTER — Emergency Department (HOSPITAL_COMMUNITY)
Admission: EM | Admit: 2015-08-04 | Discharge: 2015-08-04 | Disposition: A | Discharge status: Home / Self Care | Payer: Medicare Other | Attending: Emergency Medicine | Admitting: Emergency Medicine

## 2015-08-04 ENCOUNTER — Other Ambulatory Visit: Payer: Self-pay | Admitting: Neurology

## 2015-08-04 ENCOUNTER — Emergency Department (HOSPITAL_COMMUNITY): Payer: Medicare Other

## 2015-08-04 ENCOUNTER — Telehealth: Payer: Self-pay | Admitting: Neurology

## 2015-08-04 DIAGNOSIS — Z79899 Other long term (current) drug therapy: Secondary | ICD-10-CM | POA: Insufficient documentation

## 2015-08-04 DIAGNOSIS — M79602 Pain in left arm: Secondary | ICD-10-CM | POA: Insufficient documentation

## 2015-08-04 DIAGNOSIS — I10 Essential (primary) hypertension: Secondary | ICD-10-CM | POA: Insufficient documentation

## 2015-08-04 DIAGNOSIS — M79605 Pain in left leg: Secondary | ICD-10-CM | POA: Diagnosis not present

## 2015-08-04 DIAGNOSIS — I2510 Atherosclerotic heart disease of native coronary artery without angina pectoris: Secondary | ICD-10-CM | POA: Insufficient documentation

## 2015-08-04 DIAGNOSIS — R109 Unspecified abdominal pain: Secondary | ICD-10-CM | POA: Diagnosis not present

## 2015-08-04 DIAGNOSIS — M1990 Unspecified osteoarthritis, unspecified site: Secondary | ICD-10-CM | POA: Insufficient documentation

## 2015-08-04 DIAGNOSIS — G629 Polyneuropathy, unspecified: Secondary | ICD-10-CM | POA: Diagnosis not present

## 2015-08-04 DIAGNOSIS — Z79891 Long term (current) use of opiate analgesic: Secondary | ICD-10-CM | POA: Diagnosis not present

## 2015-08-04 DIAGNOSIS — F329 Major depressive disorder, single episode, unspecified: Secondary | ICD-10-CM | POA: Diagnosis not present

## 2015-08-04 DIAGNOSIS — M79604 Pain in right leg: Secondary | ICD-10-CM | POA: Insufficient documentation

## 2015-08-04 DIAGNOSIS — I252 Old myocardial infarction: Secondary | ICD-10-CM | POA: Diagnosis not present

## 2015-08-04 DIAGNOSIS — G5693 Unspecified mononeuropathy of bilateral upper limbs: Secondary | ICD-10-CM | POA: Diagnosis not present

## 2015-08-04 DIAGNOSIS — Z7982 Long term (current) use of aspirin: Secondary | ICD-10-CM | POA: Insufficient documentation

## 2015-08-04 DIAGNOSIS — M79601 Pain in right arm: Secondary | ICD-10-CM | POA: Insufficient documentation

## 2015-08-04 DIAGNOSIS — Z8673 Personal history of transient ischemic attack (TIA), and cerebral infarction without residual deficits: Secondary | ICD-10-CM | POA: Insufficient documentation

## 2015-08-04 DIAGNOSIS — M79609 Pain in unspecified limb: Secondary | ICD-10-CM | POA: Diagnosis not present

## 2015-08-04 DIAGNOSIS — M79603 Pain in arm, unspecified: Secondary | ICD-10-CM | POA: Diagnosis not present

## 2015-08-04 DIAGNOSIS — R110 Nausea: Secondary | ICD-10-CM | POA: Diagnosis not present

## 2015-08-04 DIAGNOSIS — G5793 Unspecified mononeuropathy of bilateral lower limbs: Secondary | ICD-10-CM | POA: Diagnosis not present

## 2015-08-04 DIAGNOSIS — I482 Chronic atrial fibrillation: Secondary | ICD-10-CM | POA: Diagnosis not present

## 2015-08-04 LAB — CBC WITH DIFFERENTIAL/PLATELET
BASOS PCT: 0 %
Basophils Absolute: 0 10*3/uL (ref 0.0–0.1)
EOS ABS: 0.3 10*3/uL (ref 0.0–0.7)
Eosinophils Relative: 5 %
HCT: 34.4 % — ABNORMAL LOW (ref 39.0–52.0)
HEMOGLOBIN: 11 g/dL — AB (ref 13.0–17.0)
Lymphocytes Relative: 24 %
Lymphs Abs: 1.6 10*3/uL (ref 0.7–4.0)
MCH: 30.3 pg (ref 26.0–34.0)
MCHC: 32 g/dL (ref 30.0–36.0)
MCV: 94.8 fL (ref 78.0–100.0)
MONOS PCT: 15 %
Monocytes Absolute: 1 10*3/uL (ref 0.1–1.0)
NEUTROS PCT: 56 %
Neutro Abs: 3.6 10*3/uL (ref 1.7–7.7)
Platelets: 214 10*3/uL (ref 150–400)
RBC: 3.63 MIL/uL — AB (ref 4.22–5.81)
RDW: 17.5 % — ABNORMAL HIGH (ref 11.5–15.5)
WBC: 6.4 10*3/uL (ref 4.0–10.5)

## 2015-08-04 LAB — COMPREHENSIVE METABOLIC PANEL
ALT: 12 U/L — AB (ref 17–63)
AST: 19 U/L (ref 15–41)
Albumin: 3.3 g/dL — ABNORMAL LOW (ref 3.5–5.0)
Alkaline Phosphatase: 60 U/L (ref 38–126)
Anion gap: 10 (ref 5–15)
BILIRUBIN TOTAL: 0.5 mg/dL (ref 0.3–1.2)
BUN: 24 mg/dL — ABNORMAL HIGH (ref 6–20)
CALCIUM: 9 mg/dL (ref 8.9–10.3)
CO2: 23 mmol/L (ref 22–32)
CREATININE: 1.71 mg/dL — AB (ref 0.61–1.24)
Chloride: 108 mmol/L (ref 101–111)
GFR, EST AFRICAN AMERICAN: 38 mL/min — AB (ref >60)
GFR, EST NON AFRICAN AMERICAN: 33 mL/min — AB (ref >60)
Glucose, Bld: 92 mg/dL (ref 65–99)
Potassium: 4.3 mmol/L (ref 3.5–5.1)
Sodium: 141 mmol/L (ref 135–145)
Total Protein: 7.3 g/dL (ref 6.5–8.1)

## 2015-08-04 LAB — URINALYSIS, ROUTINE W REFLEX MICROSCOPIC
Bilirubin Urine: NEGATIVE
Glucose, UA: NEGATIVE mg/dL
HGB URINE DIPSTICK: NEGATIVE
Ketones, ur: NEGATIVE mg/dL
Leukocytes, UA: NEGATIVE
NITRITE: NEGATIVE
PH: 6 (ref 5.0–8.0)
Protein, ur: NEGATIVE mg/dL
SPECIFIC GRAVITY, URINE: 1.02 (ref 1.005–1.030)

## 2015-08-04 LAB — BRAIN NATRIURETIC PEPTIDE: B NATRIURETIC PEPTIDE 5: 605.1 pg/mL — AB (ref 0.0–100.0)

## 2015-08-04 MED ORDER — FENTANYL CITRATE (PF) 100 MCG/2ML IJ SOLN
50.0000 ug | Freq: Once | INTRAMUSCULAR | Status: AC
  Administered 2015-08-04: 50 ug via INTRAVENOUS
  Filled 2015-08-04: qty 2

## 2015-08-04 MED ORDER — TRAZODONE HCL 50 MG PO TABS: 50.0000 mg | ORAL_TABLET | Freq: Every day | ORAL | Status: DC

## 2015-08-04 NOTE — Telephone Encounter (Signed)
Pt's daughter called said for the past month he has some disorientation, not sleeping, aggressive, agitated. [facility] Please call

## 2015-08-04 NOTE — Telephone Encounter (Signed)
Returned call to pt's daughter. Pt has appt scheduled next month. Offered sooner appt but daughter feels that pt could benefit from medication at bedtime to help w/ sleep. Says that she realizes that sleep aides are not recommended in the elderly d/t fall risk but that her mother was on something like Depakote which seemed to help. Is concerned that she would be unable to get pt in for appt d/t him not sleeping and having agitated behaviors. She first noticed increased weakness, shortness of breath and insomnia 4-6 wks ago. Pt was in hospital/rehab and had increased agitation. Thought that he would improve after returning home to ALF. However, he continues to have increased agitation w/ wandering and confusion at nighttime. Was seen in ED earlier today and reports xrays/labs negative for infection. Chest XR showed, "Lower lung volumes, with increased perihilar opacity possibly due to crowding/atelectasis. If there is new cough then consider developing respiratory infection." BNP elevated. Hx of CVA and daughter is questioning TIA. Continues to take Xarelto.

## 2015-08-04 NOTE — Telephone Encounter (Signed)
Called and spoke to dtr and she states that he is only sleeping about an hour at the time cause he is so afraid of everything. ?PTSD vs Dementia. She is wanting to know if there is something you could give him to sleep at night for him to get more then an hours worth of sleep.?

## 2015-08-04 NOTE — ED Provider Notes (Signed)
CSN: [id number]     Arrival date & time 08/04/15  [id number] History   First MD Initiated Contact with Patient 08/04/15 [phone]     Chief Complaint  Patient presents with  . Extremity Pain      Patient is a 82 y.o. male presenting with extremity pain. The history is provided by the patient.  Extremity Pain This is a recurrent problem. Pertinent negatives include no chest pain, no abdominal pain, no headaches and no shortness of breath.  Patient presents with pain in all his extremities but worse on the right side. Has a neuropathy history and states this feels somewhat like that too. States it is gotten worse. No real chest pain or trouble breathing. Has had nausea and vomiting. Abdomen is more distended. No recent changes medicine. No fever. No diarrhea. No headache. No confusion. The pain is dull and constant. Not improved in his medications. He is not sure of the medications that he is on states he's been taking them.  Past Medical History  Diagnosis Date  . PVD (peripheral vascular disease) (HCC)   . CAD (coronary artery disease)     a. 12/2005 - PCI to OM  . Hyperlipidemia   . Cerebrovascular accident (HCC)   . DVT femoral (deep venous thrombosis) with thrombophlebitis (HCC) 03/2007    [name]/notes 07/13/2010  . GERD (gastroesophageal reflux disease)   . Edema   . Vertigo   . IBS (irritable bowel syndrome)   . Duodenitis   . Gastritis   . Esophageal stricture   . Cellulitis   . Pulmonary embolism (HCC)   . Prostatic hypertrophy   . Frequent falls   . Subdural hematoma (HCC)   . PTSD (post-traumatic stress disorder)   . Vertigo   . TIA (transient ischemic attack)     a. 01/2006 and 05/2006.   [name]. Hiatal hernia     periferal neuropathy  . Hx of echocardiogram     Echo 9/13:  Mild LVH, EF 60-65%, Gr 1 diast dysfn, mild AI, mild BAE  . Gout   . Chronic low back pain   . Gait disorder   . Peripheral neuropathy (HCC)   . Hydrocele   . History of shingles     Left lumbar  . HOH (hard of  hearing)     hearing aid, totally in R ear ( no hearing aid in L )   . Myocardial infarction [facility](HCC) "years ago"    "dr said I'd had a small one"  . Daily headache   . Depression   . Urinary frequency   . Blind left eye   . Hypertension   . Dysrhythmia   . Arthritis   . Anxiety     PTSD  . Hemiparesis and alteration of sensations as late effects of stroke (HCC) 03/04/2015   Past Surgical History  Procedure Laterality Date  . Hand surgery Right     "replaced bone in my finger"  . Angioplasty    . Carotid endarterectomy Left   . Coronary angioplasty with stent placement      left circumflex  . Cataract extraction, bilateral    . Tonsillectomy    . Tympanic membrane repair Right 1978    [name]/notes 07/27/2010; "couldn'getting so t couldn't hear; had noise in my ear; didn't correct either  . Hand contracture release Right     palm/notes 07/27/2010  . Esophagogastroduodenoscopy (egd) with esophageal dilation    . Insertion of vena cava filter  2013  h/o PE  . Endarterectomy Left 11/02/2014    Procedure: LEFT  CAROTID ARTERY ENDARTERECTOMY  WITH DACRON PATCH ANGIOPLASTY;  Surgeon: Sherren Kerns, MD;  Location: [facility] OR;  Service: Vascular;  Laterality: Left;   Family History  Problem Relation Age of Onset  . Colon cancer Neg Hx   . Coronary artery disease Brother   . Heart attack Father   . Migraines Mother   . Breast cancer Sister   . Dementia Sister   . Renal Disease Brother    Social History  Substance Use Topics  . Smoking status: Never Smoker   . Smokeless tobacco: Never Used  . Alcohol Use: 0.0 oz/week    0 Standard drinks or equivalent per week     Comment: wine occasionally    Review of Systems  Constitutional: Negative for activity change and appetite change.  Eyes: Negative for pain.  Respiratory: Negative for chest tightness and shortness of breath.   Cardiovascular: Positive for leg swelling. Negative for chest pain.  Gastrointestinal: Positive for nausea and  abdominal distention. Negative for vomiting, abdominal pain and diarrhea.  Genitourinary: Negative for flank pain.  Musculoskeletal: Negative for back pain and neck stiffness.  Skin: Negative for rash and wound.  Neurological: Negative for weakness, numbness and headaches.  Psychiatric/Behavioral: Negative for behavioral problems.      Allergies  Ativan; Augmentin; Belladonna alkaloids; Clavulanic acid; Corticosteroids; Fludrocortisone acetate; Parabens; Penicillins; and Scopace  Home Medications   Prior to Admission medications   Medication Sig Start Date End Date Taking? Authorizing Provider  acetaminophen (TYLENOL) 500 MG tablet Take 1,000 mg by mouth 2 (two) times daily. Take every day per [facility]   Yes Historical Provider, MD  ALPRAZolam (XANAX) 0.25 MG tablet Take 1 tablet (0.25 mg total) by mouth 2 (two) times daily as needed for anxiety. Patient taking differently: Take 0.25 mg by mouth every 8 (eight) hours as needed for anxiety.  07/07/15  Yes Standley Brooking, MD  aspirin EC 81 MG EC tablet Take 1 tablet (81 mg total) by mouth daily. 12/23/14  Yes Shanker Levora Dredge, MD  Dentifrices (BIOTENE DRY MOUTH) GEL Place 0.5 inches onto teeth 2 (two) times daily.   Yes Historical Provider, MD  DEXILANT 60 MG capsule TAKE ONE CAPSULE BY MOUTH EVERY DAY 03/16/14  Yes Donita Brooks, MD  dextromethorphan-guaiFENesin (ROBITUSSIN COLD COUGH+ CHEST) 10-100 MG/5ML liquid Take 10 mLs by mouth every 4 (four) hours as needed for cough.   Yes Historical Provider, MD  divalproex (DEPAKOTE) 250 MG DR tablet Take 250 mg by mouth at bedtime.   Yes Historical Provider, MD  doxazosin (CARDURA) 4 MG tablet Take 1 tablet (4 mg total) by mouth daily. 03/25/15  Yes Donita Brooks, MD  DULoxetine (CYMBALTA) 30 MG capsule TAKE ONE CAPSULE BY MOUTH EVERY DAY 09/07/14  Yes York Spaniel, MD  finasteride (PROSCAR) 5 MG tablet Take 1 tablet (5 mg total) by mouth daily. 02/14/15  Yes Albertine Grates, MD  furosemide (LASIX)  40 MG tablet TAKE 1 TABLET(40 MG) BY MOUTH DAILY Patient taking differently: Take 40 mg by mouth 2 (two) times daily.  06/17/15  Yes Donita Brooks, MD  hydrALAZINE (APRESOLINE) 50 MG tablet Take 1 tablet (50 mg total) by mouth 3 (three) times daily. 02/14/15  Yes Albertine Grates, MD  HYDROcodone-acetaminophen (NORCO/VICODIN) 5-325 MG tablet Take 1 tablet by mouth every 6 (six) hours as needed for moderate pain. 07/07/15  Yes Standley Brooking, MD  ipratropium (ATROVENT)  0.06 % nasal spray Place 2 sprays into both nostrils 4 (four) times daily. 05/02/15  Yes April Palumbo, MD  lactulose, encephalopathy, (GENERLAC) 10 GM/15ML SOLN Take 13 g by mouth daily.   Yes Historical Provider, MD  latanoprost (XALATAN) 0.005 % ophthalmic solution Place 1 drop into both eyes at bedtime.   Yes Historical Provider, MD  MYRBETRIQ 25 MG TB24 tablet TAKE 1 TABLET BY MOUTH EVERY DAY 07/13/14  Yes Donita Brooks, MD  nebivolol (BYSTOLIC) 2.5 MG tablet Take 1 tablet (2.5 mg total) by mouth daily. 02/14/15  Yes Albertine Grates, MD  nitroGLYCERIN (NITROSTAT) 0.4 MG SL tablet Place 0.4 mg under the tongue every 5 (five) minutes as needed. Chest pain   Yes Historical Provider, MD  ondansetron (ZOFRAN) 4 MG tablet Take 4 mg by mouth every 8 (eight) hours as needed for nausea.   Yes Historical Provider, MD  polyethylene glycol (MIRALAX / GLYCOLAX) packet Take 17 g by mouth 2 (two) times daily. 07/07/15  Yes Standley Brooking, MD  polyvinyl alcohol (LIQUIFILM TEARS) 1.4 % ophthalmic solution Place 2 drops into both eyes 3 (three) times daily.   Yes Historical Provider, MD  PRESCRIPTION MEDICATION Take 1 patch by mouth 2 (two) times daily as needed (dry mouth). oramoist dry mouth patch   Yes Historical Provider, MD  senna-docusate (SENOKOT-S) 8.6-50 MG tablet Take 1 tablet by mouth at bedtime as needed for mild constipation. 02/14/15  Yes Albertine Grates, MD  simvastatin (ZOCOR) 40 MG tablet Take 1 tablet (40 mg total) by mouth daily at 6 PM. 12/23/14  Yes  Shanker Levora Dredge, MD  XARELTO 15 MG TABS tablet TAKE 1 TABLET BY MOUTH EVERY DAY WITH SUPPER 10/06/14  Yes Tonny Bollman, MD  NON FORMULARY Place 2 L into the nose at bedtime.    Historical Provider, MD   BP 147/88 mmHg  Pulse 89  Temp(Src) 98.2 F (36.8 C) (Oral)  Resp 30  Ht 6' (1.829 m)  Wt 200 lb (90.719 kg)  BMI 27.12 kg/m2  SpO2 98% Physical Exam  Constitutional: He appears well-developed.  HENT:  Head: Atraumatic.  Neck: Neck supple.  Cardiovascular: Normal rate.   Pulmonary/Chest: Effort normal.  Abdominal: He exhibits distension. There is tenderness.  Mild diffuse tenderness without rebound or guarding, but moderate distention  Musculoskeletal: Normal range of motion. He exhibits edema.  Moderate LE edema bilaterally. Sensation intact bilateral hands. Good grip strength bilaterally.  Neurological: He is alert.  Skin: Skin is warm. No erythema.    ED Course  Procedures (including critical care time) Labs Review Labs Reviewed  COMPREHENSIVE METABOLIC PANEL - Abnormal; Notable for the following:    BUN 24 (*)    Creatinine, Ser 1.71 (*)    Albumin 3.3 (*)    ALT 12 (*)    GFR calc non Af Amer 33 (*)    GFR calc Af Amer 38 (*)    All other components within normal limits  CBC WITH DIFFERENTIAL/PLATELET - Abnormal; Notable for the following:    RBC 3.63 (*)    Hemoglobin 11.0 (*)    HCT 34.4 (*)    RDW 17.5 (*)    All other components within normal limits  BRAIN NATRIURETIC PEPTIDE - Abnormal; Notable for the following:    B Natriuretic Peptide 605.1 (*)    All other components within normal limits  URINALYSIS, ROUTINE W REFLEX MICROSCOPIC (NOT AT [facility])    Imaging Review Dg Abd Acute W/chest  08/04/2015  CLINICAL DATA:  82 year old male with acute abdominal pain, distention, and nausea this morning. Initial encounter. EXAM: DG ABDOMEN ACUTE W/ 1V CHEST COMPARISON:  Chest radiographs 07/04/2015 and earlier. Abdominal series 16109, CT Abdomen and Pelvis  06/23/2015, and earlier FINDINGS: AP views of the chest. Lower lung volumes with mildly increased patchy opacity about both hila. This could reflect crowding. Stable cardiac size and mediastinal contours. No pneumothorax. No pneumoperitoneum. No pleural effusion identified. IVC filter re- demonstrated. Non obstructed bowel gas pattern. Numerous pelvic phleboliths again noted. Scoliosis and degenerative changes in the spine. No pneumoperitoneum on left-side-down lateral decubitus view of the abdomen. No acute osseous abnormality identified. IMPRESSION: 1.  Normal bowel gas pattern, no free air. 2. Lower lung volumes, with increased perihilar opacity possibly due to crowding/atelectasis. If there is new cough then consider developing respiratory infection. Electronically Signed   By: Odessa Fleming M.D.   On: 08/04/2015 08:15   I have personally reviewed and evaluated these images and lab results as part of my medical decision-making.   EKG Interpretation   Date/Time:  Wednesday Aug 04 2015 07:50:11 EDT Ventricular Rate:  88 PR Interval:    QRS Duration: 150 QT Interval:  386 QTC Calculation: 467 R Axis:   53 Text Interpretation:  Atrial fibrillation Right bundle branch block  Confirmed by Arriana Lohmann  MD, Doni Widmer [phone]) on 08/04/2015 8:45:32 AM      MDM   Final diagnoses:  Neuropathy (HCC)    Patient with pains in his arms and legs. States that feels like his neuropathy. Does have more edema however his weight is down. BNP is up. No new cough. X-ray does not appear to show CHF. Will discharge home to follow-up with PCP. Pain feels better after pain medicine.    Benjiman Core, MD 08/04/15 1005

## 2015-08-04 NOTE — Discharge Instructions (Signed)
Your weight is down, however ER caring some more fluid to her legs. Follow-up with Dr. Tanya NonesPickard for further management.  Peripheral Neuropathy Peripheral neuropathy is a type of nerve damage. It affects nerves that carry signals between the spinal cord and other parts of the body. These are called peripheral nerves. With peripheral neuropathy, one nerve or a group of nerves may be damaged.  CAUSES  Many things can damage peripheral nerves. For some people with peripheral neuropathy, the cause is unknown. Some causes include:  Diabetes. This is the most common cause of peripheral neuropathy.  Injury to a nerve.  Pressure or stress on a nerve that lasts a long time.  Too little vitamin B. Alcoholism can lead to this.  Infections.  Autoimmune diseases, such as multiple sclerosis and systemic lupus erythematosus.  Inherited nerve diseases.  Some medicines, such as cancer drugs.  Toxic substances, such as lead and mercury.  Too little blood flowing to the legs.  Kidney disease.  Thyroid disease. SIGNS AND SYMPTOMS  Different people have different symptoms. The symptoms you have will depend on which of your nerves is damaged. Common symptoms include:  Loss of feeling (numbness) in the feet and hands.  Tingling in the feet and hands.  Pain that burns.  Very sensitive skin.  Weakness.  Not being able to move a part of the body (paralysis).  Muscle twitching.  Clumsiness or poor coordination.  Loss of balance.  Not being able to control your bladder.  Feeling dizzy.  Sexual problems. DIAGNOSIS  Peripheral neuropathy is a symptom, not a disease. Finding the cause of peripheral neuropathy can be hard. To figure that out, your health care provider will take a medical history and do a physical exam. A neurological exam will also be done. This involves checking things affected by your brain, spinal cord, and nerves (nervous system). For example, your health care provider  will check your reflexes, how you move, and what you can feel.  Other types of tests may also be ordered, such as:  Blood tests.  A test of the fluid in your spinal cord.  Imaging tests, such as CT scans or an MRI.  Electromyography (EMG). This test checks the nerves that control muscles.  Nerve conduction velocity tests. These tests check how fast messages pass through your nerves.  Nerve biopsy. A small piece of nerve is removed. It is then checked under a microscope. TREATMENT   Medicine is often used to treat peripheral neuropathy. Medicines may include:  Pain-relieving medicines. Prescription or over-the-counter medicine may be suggested.  Antiseizure medicine. This may be used for pain.  Antidepressants. These also may help ease pain from neuropathy.  Lidocaine. This is a numbing medicine. You might wear a patch or be given a shot.  Mexiletine. This medicine is typically used to help control irregular heart rhythms.  Surgery. Surgery may be needed to relieve pressure on a nerve or to destroy a nerve that is causing pain.  Physical therapy to help movement.  Assistive devices to help movement. HOME CARE INSTRUCTIONS   Only take over-the-counter or prescription medicines as directed by your health care provider. Follow the instructions carefully for any given medicines. Do not take any other medicines without first getting approval from your health care provider.  If you have diabetes, work closely with your health care provider to keep your blood sugar under control.  If you have numbness in your feet:  Check every day for signs of injury or infection. Watch  for redness, warmth, and swelling.  Wear padded socks and comfortable shoes. These help protect your feet.  Do not do things that put pressure on your damaged nerve.  Do not smoke. Smoking keeps blood from getting to damaged nerves.  Avoid or limit alcohol. Too much alcohol can cause a lack of B vitamins.  These vitamins are needed for healthy nerves.  Develop a good support system. Coping with peripheral neuropathy can be stressful. Talk to a mental health specialist or join a support group if you are struggling.  Follow up with your health care provider as directed. SEEK MEDICAL CARE IF:   You have new signs or symptoms of peripheral neuropathy.  You are struggling emotionally from dealing with peripheral neuropathy.  You have a fever. SEEK IMMEDIATE MEDICAL CARE IF:   You have an injury or infection that is not healing.  You feel very dizzy or begin vomiting.  You have chest pain.  You have trouble breathing.   This information is not intended to replace advice given to you by your health care provider. Make sure you discuss any questions you have with your health care provider.   Document Released: 02/17/2002 Document Revised: 11/09/2010 Document Reviewed: 11/04/2012 Elsevier Interactive Patient Education [employer]

## 2015-08-04 NOTE — ED Notes (Signed)
Bed: WA16 Expected date:  Expected time:  Means of arrival:  Comments: EMS 

## 2015-08-04 NOTE — Telephone Encounter (Signed)
Patients daughter calling to discuss his current condition, he is not sleeping at all, she said he seems scared, having a lot of issues and has been acting mean and hateful  She would like a call back as to what she should do  Patient just got home from hospital today, and they found nothing wrong with him  [phone]

## 2015-08-04 NOTE — Telephone Encounter (Signed)
I called the patient, talk with the daughter. The patient has not been sleeping well, seems somewhat anxious and irritable in the evenings. I will try low-dose of trazodone at night for sleep. If this is not effective, may consider Seroquel. The patient has a revisit in about one month.

## 2015-08-04 NOTE — ED Notes (Signed)
Per EMS [facility] due to pain in right arm and right leg that is reported as achy. Pt states pain began last night and has not gotten better. No pain medications given at facility this morning per EMS or documentation from facility. Per EMS [facility].

## 2015-08-04 NOTE — ED Notes (Signed)
Call[facility] reportt to [facility]

## 2015-08-04 NOTE — ED Notes (Signed)
Called [facility] for transportation back to [facility]

## 2015-08-04 NOTE — Addendum Note (Signed)
Addended by: Stephanie AcreWILLIS, CHARLES on: 08/04/2015 06:51 PM   Modules accepted: Orders

## 2015-08-05 ENCOUNTER — Inpatient Hospital Stay (HOSPITAL_COMMUNITY)
Admission: EM | Admit: 2015-08-05 | Discharge: 2015-08-12 | DRG: 291 | Disposition: A | Discharge status: Skilled Nursing Facility | Payer: Medicare Other | Attending: Internal Medicine | Admitting: Internal Medicine

## 2015-08-05 ENCOUNTER — Encounter (HOSPITAL_COMMUNITY): Payer: Self-pay

## 2015-08-05 ENCOUNTER — Emergency Department (HOSPITAL_COMMUNITY): Payer: Medicare Other

## 2015-08-05 DIAGNOSIS — E785 Hyperlipidemia, unspecified: Secondary | ICD-10-CM | POA: Diagnosis not present

## 2015-08-05 DIAGNOSIS — N183 Chronic kidney disease, stage 3 unspecified: Secondary | ICD-10-CM | POA: Diagnosis present

## 2015-08-05 DIAGNOSIS — I10 Essential (primary) hypertension: Secondary | ICD-10-CM

## 2015-08-05 DIAGNOSIS — Z9841 Cataract extraction status, right eye: Secondary | ICD-10-CM | POA: Diagnosis not present

## 2015-08-05 DIAGNOSIS — G309 Alzheimer's disease, unspecified: Secondary | ICD-10-CM | POA: Diagnosis not present

## 2015-08-05 DIAGNOSIS — I739 Peripheral vascular disease, unspecified: Secondary | ICD-10-CM | POA: Diagnosis not present

## 2015-08-05 DIAGNOSIS — I130 Hypertensive heart and chronic kidney disease with heart failure and stage 1 through stage 4 chronic kidney disease, or unspecified chronic kidney disease: Secondary | ICD-10-CM | POA: Diagnosis not present

## 2015-08-05 DIAGNOSIS — Z9842 Cataract extraction status, left eye: Secondary | ICD-10-CM | POA: Diagnosis not present

## 2015-08-05 DIAGNOSIS — Z955 Presence of coronary angioplasty implant and graft: Secondary | ICD-10-CM | POA: Diagnosis not present

## 2015-08-05 DIAGNOSIS — I5033 Acute on chronic diastolic (congestive) heart failure: Secondary | ICD-10-CM | POA: Diagnosis present

## 2015-08-05 DIAGNOSIS — G629 Polyneuropathy, unspecified: Secondary | ICD-10-CM | POA: Diagnosis not present

## 2015-08-05 DIAGNOSIS — F0280 Dementia in other diseases classified elsewhere without behavioral disturbance: Secondary | ICD-10-CM | POA: Diagnosis present

## 2015-08-05 DIAGNOSIS — R404 Transient alteration of awareness: Secondary | ICD-10-CM | POA: Diagnosis not present

## 2015-08-05 DIAGNOSIS — G8190 Hemiplegia, unspecified affecting unspecified side: Secondary | ICD-10-CM | POA: Diagnosis present

## 2015-08-05 DIAGNOSIS — R531 Weakness: Secondary | ICD-10-CM | POA: Diagnosis not present

## 2015-08-05 DIAGNOSIS — Z66 Do not resuscitate: Secondary | ICD-10-CM | POA: Diagnosis present

## 2015-08-05 DIAGNOSIS — R778 Other specified abnormalities of plasma proteins: Secondary | ICD-10-CM

## 2015-08-05 DIAGNOSIS — R0600 Dyspnea, unspecified: Secondary | ICD-10-CM

## 2015-08-05 DIAGNOSIS — R7989 Other specified abnormal findings of blood chemistry: Secondary | ICD-10-CM | POA: Diagnosis not present

## 2015-08-05 DIAGNOSIS — E041 Nontoxic single thyroid nodule: Secondary | ICD-10-CM | POA: Diagnosis present

## 2015-08-05 DIAGNOSIS — I252 Old myocardial infarction: Secondary | ICD-10-CM

## 2015-08-05 DIAGNOSIS — I480 Paroxysmal atrial fibrillation: Secondary | ICD-10-CM | POA: Diagnosis present

## 2015-08-05 DIAGNOSIS — F418 Other specified anxiety disorders: Secondary | ICD-10-CM | POA: Diagnosis not present

## 2015-08-05 DIAGNOSIS — L03114 Cellulitis of left upper limb: Secondary | ICD-10-CM | POA: Diagnosis not present

## 2015-08-05 DIAGNOSIS — I110 Hypertensive heart disease with heart failure: Secondary | ICD-10-CM | POA: Diagnosis not present

## 2015-08-05 DIAGNOSIS — Z86718 Personal history of other venous thrombosis and embolism: Secondary | ICD-10-CM

## 2015-08-05 DIAGNOSIS — J9611 Chronic respiratory failure with hypoxia: Secondary | ICD-10-CM | POA: Diagnosis present

## 2015-08-05 DIAGNOSIS — I509 Heart failure, unspecified: Secondary | ICD-10-CM | POA: Diagnosis not present

## 2015-08-05 DIAGNOSIS — I2510 Atherosclerotic heart disease of native coronary artery without angina pectoris: Secondary | ICD-10-CM | POA: Diagnosis present

## 2015-08-05 DIAGNOSIS — F419 Anxiety disorder, unspecified: Secondary | ICD-10-CM

## 2015-08-05 DIAGNOSIS — F329 Major depressive disorder, single episode, unspecified: Secondary | ICD-10-CM | POA: Diagnosis present

## 2015-08-05 DIAGNOSIS — M109 Gout, unspecified: Secondary | ICD-10-CM | POA: Diagnosis present

## 2015-08-05 DIAGNOSIS — Z7982 Long term (current) use of aspirin: Secondary | ICD-10-CM

## 2015-08-05 DIAGNOSIS — Z6830 Body mass index (BMI) 30.0-30.9, adult: Secondary | ICD-10-CM

## 2015-08-05 DIAGNOSIS — F331 Major depressive disorder, recurrent, moderate: Secondary | ICD-10-CM | POA: Diagnosis present

## 2015-08-05 DIAGNOSIS — R52 Pain, unspecified: Secondary | ICD-10-CM

## 2015-08-05 DIAGNOSIS — K219 Gastro-esophageal reflux disease without esophagitis: Secondary | ICD-10-CM | POA: Diagnosis present

## 2015-08-05 DIAGNOSIS — G4700 Insomnia, unspecified: Secondary | ICD-10-CM | POA: Diagnosis present

## 2015-08-05 DIAGNOSIS — R600 Localized edema: Secondary | ICD-10-CM | POA: Diagnosis present

## 2015-08-05 DIAGNOSIS — H9190 Unspecified hearing loss, unspecified ear: Secondary | ICD-10-CM | POA: Diagnosis present

## 2015-08-05 DIAGNOSIS — R05 Cough: Secondary | ICD-10-CM | POA: Diagnosis not present

## 2015-08-05 DIAGNOSIS — Z7901 Long term (current) use of anticoagulants: Secondary | ICD-10-CM | POA: Diagnosis not present

## 2015-08-05 DIAGNOSIS — F4310 Post-traumatic stress disorder, unspecified: Secondary | ICD-10-CM | POA: Diagnosis present

## 2015-08-05 DIAGNOSIS — E669 Obesity, unspecified: Secondary | ICD-10-CM | POA: Diagnosis present

## 2015-08-05 DIAGNOSIS — R0602 Shortness of breath: Secondary | ICD-10-CM | POA: Diagnosis not present

## 2015-08-05 DIAGNOSIS — I5031 Acute diastolic (congestive) heart failure: Secondary | ICD-10-CM

## 2015-08-05 DIAGNOSIS — E876 Hypokalemia: Secondary | ICD-10-CM | POA: Diagnosis present

## 2015-08-05 LAB — BRAIN NATRIURETIC PEPTIDE: B Natriuretic Peptide: 651.3 pg/mL — ABNORMAL HIGH (ref 0.0–100.0)

## 2015-08-05 LAB — CBC WITH DIFFERENTIAL/PLATELET
BASOS ABS: 0 10*3/uL (ref 0.0–0.1)
Basophils Relative: 0 %
Eosinophils Absolute: 0.2 10*3/uL (ref 0.0–0.7)
Eosinophils Relative: 2 %
HEMATOCRIT: 33.5 % — AB (ref 39.0–52.0)
HEMOGLOBIN: 10.5 g/dL — AB (ref 13.0–17.0)
LYMPHS PCT: 20 %
Lymphs Abs: 1.3 10*3/uL (ref 0.7–4.0)
MCH: 29.3 pg (ref 26.0–34.0)
MCHC: 31.3 g/dL (ref 30.0–36.0)
MCV: 93.6 fL (ref 78.0–100.0)
MONO ABS: 0.7 10*3/uL (ref 0.1–1.0)
Monocytes Relative: 11 %
NEUTROS ABS: 4.1 10*3/uL (ref 1.7–7.7)
NEUTROS PCT: 67 %
PLATELETS: 200 10*3/uL (ref 150–400)
RBC: 3.58 MIL/uL — AB (ref 4.22–5.81)
RDW: 17.7 % — AB (ref 11.5–15.5)
WBC: 6.2 10*3/uL (ref 4.0–10.5)

## 2015-08-05 LAB — I-STAT CHEM 8, ED
BUN: 30 mg/dL — ABNORMAL HIGH (ref 6–20)
CALCIUM ION: 1.07 mmol/L — AB (ref 1.13–1.30)
CREATININE: 1.7 mg/dL — AB (ref 0.61–1.24)
Chloride: 107 mmol/L (ref 101–111)
GLUCOSE: 87 mg/dL (ref 65–99)
HCT: 33 % — ABNORMAL LOW (ref 39.0–52.0)
HEMOGLOBIN: 11.2 g/dL — AB (ref 13.0–17.0)
POTASSIUM: 5 mmol/L (ref 3.5–5.1)
Sodium: 141 mmol/L (ref 135–145)
TCO2: 23 mmol/L (ref 0–100)

## 2015-08-05 LAB — I-STAT TROPONIN, ED: TROPONIN I, POC: 0.28 ng/mL — AB (ref 0.00–0.08)

## 2015-08-05 LAB — MRSA PCR SCREENING: MRSA by PCR: NEGATIVE

## 2015-08-05 MED ORDER — POLYETHYLENE GLYCOL 3350 17 G PO PACK
17.0000 g | PACK | Freq: Two times a day (BID) | ORAL | Status: DC
  Administered 2015-08-06 – 2015-08-11 (×12): 17 g via ORAL
  Filled 2015-08-05 (×13): qty 1

## 2015-08-05 MED ORDER — ONDANSETRON HCL 4 MG/2ML IJ SOLN: 4.0000 mg | Freq: Four times a day (QID) | INTRAMUSCULAR | Status: DC | PRN

## 2015-08-05 MED ORDER — DOXAZOSIN MESYLATE 8 MG PO TABS
4.0000 mg | ORAL_TABLET | Freq: Every day | ORAL | Status: DC
  Administered 2015-08-06 – 2015-08-12 (×7): 4 mg via ORAL
  Filled 2015-08-05 (×7): qty 1

## 2015-08-05 MED ORDER — SODIUM CHLORIDE 0.9% FLUSH: 3.0000 mL | INTRAVENOUS | Status: DC | PRN

## 2015-08-05 MED ORDER — FUROSEMIDE 10 MG/ML IJ SOLN
40.0000 mg | Freq: Once | INTRAMUSCULAR | Status: AC
  Administered 2015-08-05: 40 mg via INTRAVENOUS
  Filled 2015-08-05: qty 4

## 2015-08-05 MED ORDER — ACETAMINOPHEN 325 MG PO TABS
650.0000 mg | ORAL_TABLET | ORAL | Status: DC | PRN
  Administered 2015-08-06 – 2015-08-07 (×3): 650 mg via ORAL
  Filled 2015-08-05 (×3): qty 2

## 2015-08-05 MED ORDER — SODIUM CHLORIDE 0.9 % IV SOLN: 250.0000 mL | INTRAVENOUS | Status: DC | PRN

## 2015-08-05 MED ORDER — SENNOSIDES-DOCUSATE SODIUM 8.6-50 MG PO TABS
1.0000 | ORAL_TABLET | Freq: Every evening | ORAL | Status: DC | PRN
  Administered 2015-08-09 – 2015-08-10 (×2): 1 via ORAL
  Filled 2015-08-05 (×2): qty 1

## 2015-08-05 MED ORDER — MIRABEGRON ER 25 MG PO TB24
25.0000 mg | ORAL_TABLET | Freq: Every day | ORAL | Status: DC
  Administered 2015-08-06 – 2015-08-12 (×7): 25 mg via ORAL
  Filled 2015-08-05 (×7): qty 1

## 2015-08-05 MED ORDER — TRAZODONE HCL 50 MG PO TABS
50.0000 mg | ORAL_TABLET | Freq: Every day | ORAL | Status: DC
  Administered 2015-08-05 – 2015-08-11 (×7): 50 mg via ORAL
  Filled 2015-08-05 (×7): qty 1

## 2015-08-05 MED ORDER — FUROSEMIDE 40 MG PO TABS
40.0000 mg | ORAL_TABLET | Freq: Every day | ORAL | Status: DC
  Administered 2015-08-06 – 2015-08-08 (×3): 40 mg via ORAL
  Filled 2015-08-05 (×3): qty 1

## 2015-08-05 MED ORDER — DULOXETINE HCL 30 MG PO CPEP
30.0000 mg | ORAL_CAPSULE | Freq: Every day | ORAL | Status: DC
  Administered 2015-08-06 – 2015-08-10 (×5): 30 mg via ORAL
  Filled 2015-08-05 (×5): qty 1

## 2015-08-05 MED ORDER — FINASTERIDE 5 MG PO TABS
5.0000 mg | ORAL_TABLET | Freq: Every day | ORAL | Status: DC
  Administered 2015-08-06 – 2015-08-12 (×7): 5 mg via ORAL
  Filled 2015-08-05 (×7): qty 1

## 2015-08-05 MED ORDER — SODIUM CHLORIDE 0.9% FLUSH
3.0000 mL | Freq: Two times a day (BID) | INTRAVENOUS | Status: DC
  Administered 2015-08-06 – 2015-08-12 (×10): 3 mL via INTRAVENOUS

## 2015-08-05 MED ORDER — IPRATROPIUM BROMIDE 0.06 % NA SOLN
2.0000 | Freq: Four times a day (QID) | NASAL | Status: DC
  Administered 2015-08-05 – 2015-08-07 (×7): 2 via NASAL
  Filled 2015-08-05: qty 15

## 2015-08-05 MED ORDER — CETYLPYRIDINIUM CHLORIDE 0.05 % MT LIQD
7.0000 mL | Freq: Two times a day (BID) | OROMUCOSAL | Status: DC
  Administered 2015-08-06 – 2015-08-12 (×12): 7 mL via OROMUCOSAL

## 2015-08-05 MED ORDER — LATANOPROST 0.005 % OP SOLN
1.0000 [drp] | Freq: Every day | OPHTHALMIC | Status: DC
  Administered 2015-08-05 – 2015-08-11 (×7): 1 [drp] via OPHTHALMIC
  Filled 2015-08-05: qty 2.5

## 2015-08-05 MED ORDER — SIMVASTATIN 40 MG PO TABS
40.0000 mg | ORAL_TABLET | Freq: Every day | ORAL | Status: DC
  Administered 2015-08-05 – 2015-08-06 (×2): 40 mg via ORAL
  Filled 2015-08-05 (×2): qty 1

## 2015-08-05 MED ORDER — HYDRALAZINE HCL 50 MG PO TABS
50.0000 mg | ORAL_TABLET | Freq: Three times a day (TID) | ORAL | Status: DC
  Administered 2015-08-05 – 2015-08-07 (×6): 50 mg via ORAL
  Filled 2015-08-05 (×6): qty 1

## 2015-08-05 MED ORDER — POLYVINYL ALCOHOL 1.4 % OP SOLN
2.0000 [drp] | Freq: Three times a day (TID) | OPHTHALMIC | Status: DC
  Administered 2015-08-05 – 2015-08-12 (×20): 2 [drp] via OPHTHALMIC
  Filled 2015-08-05: qty 15

## 2015-08-05 MED ORDER — ASPIRIN EC 81 MG PO TBEC
81.0000 mg | DELAYED_RELEASE_TABLET | Freq: Every day | ORAL | Status: DC
  Administered 2015-08-06 – 2015-08-12 (×7): 81 mg via ORAL
  Filled 2015-08-05 (×8): qty 1

## 2015-08-05 MED ORDER — NEBIVOLOL HCL 2.5 MG PO TABS
2.5000 mg | ORAL_TABLET | Freq: Every day | ORAL | Status: DC
  Administered 2015-08-06 – 2015-08-12 (×7): 2.5 mg via ORAL
  Filled 2015-08-05 (×7): qty 1

## 2015-08-05 MED ORDER — PANTOPRAZOLE SODIUM 40 MG PO TBEC: 40.0000 mg | DELAYED_RELEASE_TABLET | Freq: Every day | ORAL | Status: DC

## 2015-08-05 MED ORDER — DEXLANSOPRAZOLE 60 MG PO CPDR
60.0000 mg | DELAYED_RELEASE_CAPSULE | Freq: Every day | ORAL | Status: DC
  Administered 2015-08-06 – 2015-08-12 (×7): 60 mg via ORAL
  Filled 2015-08-05 (×11): qty 1

## 2015-08-05 MED ORDER — HYDROCODONE-ACETAMINOPHEN 5-325 MG PO TABS
1.0000 | ORAL_TABLET | Freq: Four times a day (QID) | ORAL | Status: DC | PRN
  Administered 2015-08-05 – 2015-08-08 (×8): 1 via ORAL
  Filled 2015-08-05 (×8): qty 1

## 2015-08-05 MED ORDER — RIVAROXABAN 15 MG PO TABS
15.0000 mg | ORAL_TABLET | Freq: Every day | ORAL | Status: DC
  Administered 2015-08-05 – 2015-08-11 (×6): 15 mg via ORAL
  Filled 2015-08-05 (×7): qty 1

## 2015-08-05 MED ORDER — BIOTENE DRY MOUTH DT GEL: 0.5000 [in_us] | Freq: Two times a day (BID) | DENTAL | Status: DC

## 2015-08-05 NOTE — Telephone Encounter (Signed)
[facility] looks like this is also being taken care of by Dr. Anne HahnWillis

## 2015-08-05 NOTE — ED Notes (Signed)
Pt brought in EMS [facility] for generalized weakness and SHOB this morning that has since resolved.  Pt was seen in ED yesterday for same complaints and has hx of anxiety.  Pt is in NAD upon arrival.

## 2015-08-05 NOTE — ED Provider Notes (Addendum)
CSN: [id number]     Arrival date & time 08/05/15  1202 History   First MD Initiated Contact with Patient 08/05/15 1209     Chief Complaint  Patient presents with  . Weakness      Patient is a 82 y.o. male presenting with weakness. The history is provided by the patient.  Weakness Associated symptoms include shortness of breath. Pertinent negatives include no chest pain, no abdominal pain and no headaches.  Patient was sent in from the nursing home with shortness of breath. Reportedly had an episode of shortness of breath this morning. Patient states that resolved and he feels much better now. History of CHF and seen by myself yesterday. Has edema in both his legs. Had pain that was presumed to be his neuropathy. Felt better yesterday. Now states his arms and legs feel much better. Ambulated and had initial mild hypoxia that resolved with ambulation. No chest pain. No fevers. No cough.  Past Medical History  Diagnosis Date  . PVD (peripheral vascular disease) (HCC)   . CAD (coronary artery disease)     a. 12/2005 - PCI to OM  . Hyperlipidemia   . Cerebrovascular accident (HCC)   . 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 GERD (gastroesophageal reflux disease)   . Edema   . Vertigo   . IBS (irritable bowel syndrome)   . Duodenitis   . Gastritis   . Esophageal stricture   . Cellulitis   . Pulmonary embolism (HCC)   . Prostatic hypertrophy   . Frequent falls   . Subdural hematoma (HCC)   . PTSD (post-traumatic stress disorder)   . Vertigo   . TIA (transient ischemic attack)     a. 01/2006 and 05/2006.   [name] Hiatal hernia     periferal neuropathy  . Hx of echocardiogram     Echo 9/13:  Mild LVH, EF 60-65%, Gr 1 diast dysfn, mild AI, mild BAE  . Gout   . Chronic low back pain   . Gait disorder   . Peripheral neuropathy (HCC)   . Hydrocele   . History of shingles     Left lumbar  . HOH (hard of hearing)     hearing aid,  totally in R ear ( no hearing aid in L )   . Myocardial infarction [facility]) "years ago"    "dr said I'd had a small one"  . Daily headache   . Depression   . Urinary frequency   . Blind left eye   . Hypertension   . Dysrhythmia   . Arthritis   . Anxiety     PTSD  . Hemiparesis and alteration of sensations as late effects of stroke (HCC) 03/04/2015   Past Surgical History  Procedure Laterality Date  . Hand surgery Right     "replaced bone in my finger"  . Angioplasty    . Carotid endarterectomy Left   . Coronary angioplasty with stent placement      left circumflex  . Cataract extraction, bilateral    . Tonsillectomy    . Tympanic membrane repair Right 1978    [name] 07/27/2010; "couldn'getting so t couldn't hear; had noise in my ear; didn't correct either  . Hand contracture release Right     palm/notes 07/27/2010  . Esophagogastroduodenoscopy (egd) with esophageal dilation    . Insertion of vena cava filter  2013    h/o PE  . Endarterectomy Left 11/02/2014  Procedure: LEFT  CAROTID ARTERY ENDARTERECTOMY  WITH DACRON PATCH ANGIOPLASTY;  Surgeon: Sherren Kerns, MD;  Location: [facility] OR;  Service: Vascular;  Laterality: Left;   Family History  Problem Relation Age of Onset  . Colon cancer Neg Hx   . Coronary artery disease Brother   . Heart attack Father   . Migraines Mother   . Breast cancer Sister   . Dementia Sister   . Renal Disease Brother    Social History  Substance Use Topics  . Smoking status: Never Smoker   . Smokeless tobacco: Never Used  . Alcohol Use: 0.0 oz/week    0 Standard drinks or equivalent per week     Comment: wine occasionally    Review of Systems  Constitutional: Negative for activity change and appetite change.  Eyes: Negative for pain.  Respiratory: Positive for shortness of breath. Negative for cough and chest tightness.   Cardiovascular: Positive for leg swelling. Negative for chest pain.  Gastrointestinal: Negative for nausea, vomiting,  abdominal pain and diarrhea.  Genitourinary: Negative for flank pain.  Musculoskeletal: Negative for neck stiffness.  Skin: Negative for rash.  Neurological: Positive for weakness. Negative for numbness and headaches.  Psychiatric/Behavioral: Negative for behavioral problems.      Allergies  Ativan; Augmentin; Belladonna alkaloids; Clavulanic acid; Corticosteroids; Fludrocortisone acetate; Parabens; Penicillins; and Scopace  Home Medications   Prior to Admission medications   Medication Sig Start Date End Date Taking? Authorizing Provider  acetaminophen (TYLENOL) 500 MG tablet Take 1,000 mg by mouth 2 (two) times daily. Take every day per [facility]   Yes Historical Provider, MD  ALPRAZolam (XANAX) 0.25 MG tablet Take 1 tablet (0.25 mg total) by mouth 2 (two) times daily as needed for anxiety. Patient taking differently: Take 0.25 mg by mouth every 8 (eight) hours as needed for anxiety.  07/07/15  Yes Standley Brooking, MD  aspirin EC 81 MG EC tablet Take 1 tablet (81 mg total) by mouth daily. 12/23/14  Yes Shanker Levora Dredge, MD  Dentifrices (BIOTENE DRY MOUTH) GEL Place 0.5 inches onto teeth 2 (two) times daily.   Yes Historical Provider, MD  DEXILANT 60 MG capsule TAKE ONE CAPSULE BY MOUTH EVERY DAY 03/16/14  Yes Donita Brooks, MD  dextromethorphan-guaiFENesin (ROBITUSSIN COLD COUGH+ CHEST) 10-100 MG/5ML liquid Take 10 mLs by mouth every 4 (four) hours as needed for cough.   Yes Historical Provider, MD  divalproex (DEPAKOTE) 250 MG DR tablet Take 250 mg by mouth at bedtime.   Yes Historical Provider, MD  doxazosin (CARDURA) 4 MG tablet Take 1 tablet (4 mg total) by mouth daily. 03/25/15  Yes Donita Brooks, MD  DULoxetine (CYMBALTA) 30 MG capsule TAKE ONE CAPSULE BY MOUTH EVERY DAY 09/07/14  Yes York Spaniel, MD  finasteride (PROSCAR) 5 MG tablet Take 1 tablet (5 mg total) by mouth daily. 02/14/15  Yes Albertine Grates, MD  furosemide (LASIX) 40 MG tablet TAKE 1 TABLET(40 MG) BY MOUTH DAILY Patient  taking differently: Take 40 mg by mouth 2 (two) times daily.  06/17/15  Yes Donita Brooks, MD  hydrALAZINE (APRESOLINE) 50 MG tablet Take 1 tablet (50 mg total) by mouth 3 (three) times daily. 02/14/15  Yes Albertine Grates, MD  HYDROcodone-acetaminophen (NORCO/VICODIN) 5-325 MG tablet Take 1 tablet by mouth every 6 (six) hours as needed for moderate pain. 07/07/15  Yes Standley Brooking, MD  ipratropium (ATROVENT) 0.06 % nasal spray Place 2 sprays into both nostrils 4 (four) times daily. 05/02/15  Yes April Palumbo, MD  lactulose, encephalopathy, (GENERLAC) 10 GM/15ML SOLN Take 13 g by mouth daily.   Yes Historical Provider, MD  latanoprost (XALATAN) 0.005 % ophthalmic solution Place 1 drop into both eyes at bedtime.   Yes Historical Provider, MD  MYRBETRIQ 25 MG TB24 tablet TAKE 1 TABLET BY MOUTH EVERY DAY 07/13/14  Yes Donita BrooksWarren T [name], MD  nebivolol (BYSTOLIC) 2.5 MG tablet Take 1 tablet (2.5 mg total) by mouth daily. 02/14/15  Yes Albertine GratesFang Xu, MD  nitroGLYCERIN (NITROSTAT) 0.4 MG SL tablet Place 0.4 mg under the tongue every 5 (five) minutes as needed. Chest pain   Yes Historical Provider, MD  NON FORMULARY Place 2 L into the nose at bedtime.   Yes Historical Provider, MD  ondansetron (ZOFRAN) 4 MG tablet Take 4 mg by mouth every 8 (eight) hours as needed for nausea.   Yes Historical Provider, MD  polyethylene glycol (MIRALAX / GLYCOLAX) packet Take 17 g by mouth 2 (two) times daily. 07/07/15  Yes Standley Brookinganiel P Goodrich, MD  polyvinyl alcohol (LIQUIFILM TEARS) 1.4 % ophthalmic solution Place 2 drops into both eyes 3 (three) times daily.   Yes Historical Provider, MD  PRESCRIPTION MEDICATION Take 1 patch by mouth 2 (two) times daily as needed (dry mouth). oramoist dry mouth patch   Yes Historical Provider, MD  senna-docusate (SENOKOT-S) 8.6-50 MG tablet Take 1 tablet by mouth at bedtime as needed for mild constipation. 02/14/15  Yes Albertine GratesFang Xu, MD  simvastatin (ZOCOR) 40 MG tablet Take 1 tablet (40 mg total) by mouth daily  at 6 PM. 12/23/14  Yes Shanker Levora DredgeM Ghimire, MD  traZODone (DESYREL) 50 MG tablet TAKE 1 TABLET(50 MG) BY MOUTH AT BEDTIME 08/05/15  Yes York Spanielharles K Willis, MD  XARELTO 15 MG TABS tablet TAKE 1 TABLET BY MOUTH EVERY DAY WITH SUPPER 10/06/14  Yes Tonny BollmanMichael Cooper, MD   BP 130/76 mmHg  Pulse 84  Temp(Src) 98.6 F (37 C) (Oral)  Resp 34  Ht 5\' 9"  (1.753 m)  Wt 200 lb (90.719 kg)  BMI 29.52 kg/m2  SpO2 94% Physical Exam  Constitutional: He appears well-developed.  HENT:  Head: Atraumatic.  Eyes: EOM are normal.  Neck: No JVD present.  Pulmonary/Chest: Effort normal.  Mildly harsh breath sounds with rare scattered rales.  Abdominal: Soft.  Musculoskeletal: He exhibits edema.  Moderate edema to bilateral lower extremities.  Neurological: He is alert.  Skin: Skin is warm.    ED Course  Procedures (including critical care time) Labs Review Labs Reviewed  CBC WITH DIFFERENTIAL/PLATELET - Abnormal; Notable for the following:    RBC 3.58 (*)    Hemoglobin 10.5 (*)    HCT 33.5 (*)    RDW 17.7 (*)    All other components within normal limits  I-STAT CHEM 8, ED - Abnormal; Notable for the following:    BUN 30 (*)    Creatinine, Ser 1.70 (*)    Calcium, Ion 1.07 (*)    Hemoglobin 11.2 (*)    HCT 33.0 (*)    All other components within normal limits  I-STAT TROPOININ, ED - Abnormal; Notable for the following:    Troponin i, poc 0.28 (*)    All other components within normal limits  BRAIN NATRIURETIC PEPTIDE    Imaging Review Dg Abd Acute W/chest  08/04/2015  CLINICAL DATA:  82 year old male with acute abdominal pain, distention, and nausea this morning. Initial encounter. EXAM: DG ABDOMEN ACUTE W/ 1V CHEST COMPARISON:  Chest radiographs 07/04/2015 and earlier.  Abdominal series 16109, CT Abdomen and Pelvis 06/23/2015, and earlier FINDINGS: AP views of the chest. Lower lung volumes with mildly increased patchy opacity about both hila. This could reflect crowding. Stable cardiac size and  mediastinal contours. No pneumothorax. No pneumoperitoneum. No pleural effusion identified. IVC filter re- demonstrated. Non obstructed bowel gas pattern. Numerous pelvic phleboliths again noted. Scoliosis and degenerative changes in the spine. No pneumoperitoneum on left-side-down lateral decubitus view of the abdomen. No acute osseous abnormality identified. IMPRESSION: 1.  Normal bowel gas pattern, no free air. 2. Lower lung volumes, with increased perihilar opacity possibly due to crowding/atelectasis. If there is new cough then consider developing respiratory infection. Electronically Signed   By: Odessa Fleming M.D.   On: 08/04/2015 08:15   I have personally reviewed and evaluated these images and lab results as part of my medical decision-making.   EKG Interpretation   Date/Time:  Thursday Aug 05 2015 12:03:07 EDT Ventricular Rate:  86 PR Interval:    QRS Duration: 149 QT Interval:  366 QTC Calculation: 438 R Axis:   67 Text Interpretation:  Atrial fibrillation Right bundle branch block  Confirmed by Laronda Lisby  MD, Harrold Donath [phone]) on 08/05/2015 12:09:41 PM      MDM   Final diagnoses:  Acute on chronic congestive heart failure, unspecified congestive heart failure type (HCC)  Elevated troponin I level    Patient episode of shortness of breath earlier today this reportedly resolved. Seen yesterday by myself at [facility] long. Had right-sided arm and bilateral leg pain at that time. At that time felt to be his neuropathy. Felt better after treatment. Now EKG is stable but troponin is mildly elevated. Patient denies chest pain. Did have mild hypoxia. Will admit to internal medicine, however Dr. Benjamine Mola requested the lasix be given and urine output recorded along with the return of the BNP and chest x-ray from today before she will admit the patient.   Benjiman Core, MD 08/05/15 1313  Benjiman Core, MD 08/05/15 [phone]

## 2015-08-05 NOTE — Telephone Encounter (Signed)
Pt's dtr aware and medication was started by Dr. Anne HahnWillis

## 2015-08-05 NOTE — ED Notes (Addendum)
Lowest sat while ambulating was 89% on RA just prior to walking.  Highest sat was 94% RA.  Pt now in bed and resting sats noted to be 97% RA

## 2015-08-05 NOTE — Telephone Encounter (Signed)
Have they tried xanax at night.  If they have and it is not working, add trazodone 50 mg poqhs.

## 2015-08-05 NOTE — H&P (Signed)
History and Physical    Jason [name] UJW:[id number] DOB: [birth year] DOA: 08/05/2015   PCP: Leo Grosser, MD   Patient coming from/Resides with: ALF/[facility] for rehab  Chief Complaint: Shortness of breath and insomnia  HPI: Jason [name] is a 82 y.o. male with medical history significant for chronic diastolic heart failure, chronic hypoxemia on daytime oxygen while exerting and prn nocturnal oxygen, paroxysmal atrial fibrillation on Xarelto, hypertension, chronic kidney disease, chronic bilateral extremity edema, history of DVT, dyslipidemia, GERD, history of stroke, history of carotid endarterectomy. He was sent to the hospital from the facility with reports of generalized weakness and shortness of breath this morning that had resolved by the time the patient arrived to the ER. Patient reports he thinks his symptoms are related to anxiety. He states he is not sleeping well during the nighttime hours and typically dozes during the day. He sits most of the time with his legs dependent. He primarily complains of leg pain with walking and because of this pain he does not ambulate much. In review of the outpatient documentation on the EMR the daughter has been reporting patient with increasing nocturnal confusion and agitation as well as patient being "hateful". At bedside daughter reports that he is not as hateful but he is still not sleeping and there are concerns of her PTSD. Additional review of the outpatient EMR shows that the patient was supposed to begin trazodone at hour of sleep tonight. In review of the facility medication menstruation she patient has prn Xanax ordered and has used for the past 5 nights otherwise has not typically used and also has an order (new from previous discharge in April) for Depakote apparently being used for insomnia as well. PCP has recommended discontinuation of Xanax once trazadone initiated per the EMR. Patient had been seen in this ER on 5/24 with  complaints of arm and leg pain but no significant findings noted upon evaluation and exam so patient was sent back to the facility. Per my evaluation patient primarily complaining of feeling like food is getting stuck in his throat and dry mouth.  ED Course:  PO temp 98.6-BP 126/75-pulse 90-respirations 24-O2 saturations on 3 L nasal cannula 100% 2 view chest x-ray: Central mild vascular congestion without convincing pulmonary edema with a tiny left pleural effusion and streaky left basilar atelectasis or infiltrate Lab data: Sodium 141, potassium 5.0, BUN 30, creatinine 1.7, glucose 87, ionized calcium 1.07, BNP 652, point of care troponin 0.28, WBC 6200 with normal differential, hemoglobin 10.5, platelets 200,000 Lasix 40 mg IV 1  Review of Systems:  In addition to the HPI above,  No Fever-chills, myalgias or other constitutional symptoms No Headache, changes with Vision or hearing, new weakness, tingling, numbness in any extremity, No indigestion/reflux No Chest pain, Cough, palpitations, orthopnea  No Abdominal pain, N/V; no melena or hematochezia, no dark tarry stools No dysuria, hematuria or flank pain No new skin rashes, lesions, masses or bruises, No new joints pains-aches Daughter suspects patient's dry weight may have increased over the past year No polyuria, polydypsia or polyphagia,   Past Medical History  Diagnosis Date  . PVD (peripheral vascular disease) (HCC)   . CAD (coronary artery disease)     a. 12/2005 - PCI to OM  . Hyperlipidemia   . Cerebrovascular accident (HCC)   . 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 GERD (gastroesophageal reflux disease)   . Edema   . Vertigo   .  IBS (irritable bowel syndrome)   . Duodenitis   . Gastritis   . Esophageal stricture   . Cellulitis   . Pulmonary embolism (HCC)   . Prostatic hypertrophy   . Frequent falls   . Subdural hematoma (HCC)   . PTSD (post-traumatic  stress disorder)   . Vertigo   . TIA (transient ischemic attack)     a. 01/2006 and 05/2006.   [name] Hiatal hernia     periferal neuropathy  . Hx of echocardiogram     Echo 9/13:  Mild LVH, EF 60-65%, Gr 1 diast dysfn, mild AI, mild BAE  . Gout   . Chronic low back pain   . Gait disorder   . Peripheral neuropathy (HCC)   . Hydrocele   . History of shingles     Left lumbar  . HOH (hard of hearing)     hearing aid, totally in R ear ( no hearing aid in L )   . Myocardial infarction [facility]) "years ago"    "dr said I'd had a small one"  . Daily headache   . Depression   . Urinary frequency   . Blind left eye   . Hypertension   . Dysrhythmia   . Arthritis   . Anxiety     PTSD  . Hemiparesis and alteration of sensations as late effects of stroke (HCC) 03/04/2015    Past Surgical History  Procedure Laterality Date  . Hand surgery Right     "replaced bone in my finger"  . Angioplasty    . Carotid endarterectomy Left   . Coronary angioplasty with stent placement      left circumflex  . Cataract extraction, bilateral    . Tonsillectomy    . Tympanic membrane repair Right 1978    [name] 07/27/2010; "couldn'getting so t couldn't hear; had noise in my ear; didn't correct either  . Hand contracture release Right     palm/notes 07/27/2010  . Esophagogastroduodenoscopy (egd) with esophageal dilation    . Insertion of vena cava filter  2013    h/o PE  . Endarterectomy Left 11/02/2014    Procedure: LEFT  CAROTID ARTERY ENDARTERECTOMY  WITH DACRON PATCH ANGIOPLASTY;  Surgeon: Sherren Kerns, MD;  Location: [facility] OR;  Service: Vascular;  Laterality: Left;     reports that he has never smoked. He has never used smokeless tobacco. He reports that he drinks alcohol. He reports that he does not use illicit drugs.  Mobility: Rolling walker, occasionally wheelchair Work history: Not obtained   Allergies  Allergen Reactions  . Ativan [Lorazepam] Other (See Comments)    unknown  . Augmentin  [Amoxicillin-Pot Clavulanate] Nausea And Vomiting    daughter states he can tolerate Amoxicillin ok Profuse vomiting. Unable to answer penicillin questions as pt is from Nursing home and unable to provide answers to questions  . Belladonna Alkaloids     Unknown- on MAR  . Clavulanic Acid     Unknown- on MAR  . Corticosteroids     Unknown- on MAR  . Fludrocortisone Acetate Other (See Comments)    "leg swelling"  . Parabens     Unknown- on MAR  . Penicillins     Unknown- on MAR  . Scopace [Scopolamine] Other (See Comments)    *patch "Made him crazy" per patients daughter     Family History  Problem Relation Age of Onset  . Colon cancer Neg Hx   . Coronary artery disease Brother   . Heart  attack Father   . Migraines Mother   . Breast cancer Sister   . Dementia Sister   . Renal Disease Brother      Prior to Admission medications   Medication Sig Start Date End Date Taking? Authorizing Provider  acetaminophen (TYLENOL) 500 MG tablet Take 1,000 mg by mouth 2 (two) times daily. Take every day [facility]   Yes Historical Provider, MD  ALPRAZolam (XANAX) 0.25 MG tablet Take 1 tablet (0.25 mg total) by mouth 2 (two) times daily as needed for anxiety. Patient taking differently: Take 0.25 mg by mouth every 8 (eight) hours as needed for anxiety.  07/07/15  Yes Standley Brookinganiel P Goodrich, MD  aspirin EC 81 MG EC tablet Take 1 tablet (81 mg total) by mouth daily. 12/23/14  Yes Shanker Levora DredgeM Ghimire, MD  Dentifrices (BIOTENE DRY MOUTH) GEL Place 0.5 inches onto teeth 2 (two) times daily.   Yes Historical Provider, MD  DEXILANT 60 MG capsule TAKE ONE CAPSULE BY MOUTH EVERY DAY 03/16/14  Yes Donita BrooksWarren T [name], MD  dextromethorphan-guaiFENesin (ROBITUSSIN COLD COUGH+ CHEST) 10-100 MG/5ML liquid Take 10 mLs by mouth every 4 (four) hours as needed for cough.   Yes Historical Provider, MD  divalproex (DEPAKOTE) 250 MG DR tablet Take 250 mg by mouth at bedtime.   Yes Historical Provider, MD  doxazosin (CARDURA) 4  MG tablet Take 1 tablet (4 mg total) by mouth daily. 03/25/15  Yes Donita BrooksWarren T [name], MD  DULoxetine (CYMBALTA) 30 MG capsule TAKE ONE CAPSULE BY MOUTH EVERY DAY 09/07/14  Yes York Spanielharles K Willis, MD  finasteride (PROSCAR) 5 MG tablet Take 1 tablet (5 mg total) by mouth daily. 02/14/15  Yes Albertine GratesFang Xu, MD  furosemide (LASIX) 40 MG tablet TAKE 1 TABLET(40 MG) BY MOUTH DAILY Patient taking differently: Take 40 mg by mouth 2 (two) times daily.  06/17/15  Yes Donita BrooksWarren T [name], MD  hydrALAZINE (APRESOLINE) 50 MG tablet Take 1 tablet (50 mg total) by mouth 3 (three) times daily. 02/14/15  Yes Albertine GratesFang Xu, MD  HYDROcodone-acetaminophen (NORCO/VICODIN) 5-325 MG tablet Take 1 tablet by mouth every 6 (six) hours as needed for moderate pain. 07/07/15  Yes Standley Brookinganiel P Goodrich, MD  ipratropium (ATROVENT) 0.06 % nasal spray Place 2 sprays into both nostrils 4 (four) times daily. 05/02/15  Yes April Palumbo, MD  lactulose, encephalopathy, (GENERLAC) 10 GM/15ML SOLN Take 13 g by mouth daily.   Yes Historical Provider, MD  latanoprost (XALATAN) 0.005 % ophthalmic solution Place 1 drop into both eyes at bedtime.   Yes Historical Provider, MD  MYRBETRIQ 25 MG TB24 tablet TAKE 1 TABLET BY MOUTH EVERY DAY 07/13/14  Yes Donita BrooksWarren T [name], MD  nebivolol (BYSTOLIC) 2.5 MG tablet Take 1 tablet (2.5 mg total) by mouth daily. 02/14/15  Yes Albertine GratesFang Xu, MD  nitroGLYCERIN (NITROSTAT) 0.4 MG SL tablet Place 0.4 mg under the tongue every 5 (five) minutes as needed. Chest pain   Yes Historical Provider, MD  NON FORMULARY Place 2 L into the nose at bedtime.   Yes Historical Provider, MD  ondansetron (ZOFRAN) 4 MG tablet Take 4 mg by mouth every 8 (eight) hours as needed for nausea.   Yes Historical Provider, MD  polyethylene glycol (MIRALAX / GLYCOLAX) packet Take 17 g by mouth 2 (two) times daily. 07/07/15  Yes Standley Brookinganiel P Goodrich, MD  polyvinyl alcohol (LIQUIFILM TEARS) 1.4 % ophthalmic solution Place 2 drops into both eyes 3 (three) times daily.   Yes  Historical Provider, MD  PRESCRIPTION MEDICATION  Take 1 patch by mouth 2 (two) times daily as needed (dry mouth). oramoist dry mouth patch   Yes Historical Provider, MD  senna-docusate (SENOKOT-S) 8.6-50 MG tablet Take 1 tablet by mouth at bedtime as needed for mild constipation. 02/14/15  Yes Albertine Grates, MD  simvastatin (ZOCOR) 40 MG tablet Take 1 tablet (40 mg total) by mouth daily at 6 PM. 12/23/14  Yes Shanker Levora Dredge, MD  traZODone (DESYREL) 50 MG tablet TAKE 1 TABLET(50 MG) BY MOUTH AT BEDTIME 08/05/15  Yes York Spaniel, MD  XARELTO 15 MG TABS tablet TAKE 1 TABLET BY MOUTH EVERY DAY WITH SUPPER 10/06/14  Yes Tonny Bollman, MD    Physical Exam: Filed Vitals:   08/05/15 1202 08/05/15 1206  BP:  130/76  Pulse:  84  Temp:  98.6 F (37 C)  TempSrc:  Oral  Resp:  34  Height:  5\' 9"  (1.753 m)  Weight:  200 lb (90.719 kg)  SpO2: 98% 94%      Constitutional: NAD, Somewhat anxious, comfortable Eyes: PERRL, lids and conjunctivae normal ENMT: Mucous membranes are dry. Posterior pharynx clear of any exudate or lesions.Normal dentition.  Neck: normal, supple, no masses, no thyromegaly Respiratory: clear to auscultation bilaterally, no wheezing, no crackles. Normal respiratory effort. No accessory muscle use.  Cardiovascular: Regular rate and rhythm, no murmurs / rubs / gallops. Chronic appearing bilateral lower extremity edema 2+. 2+ pedal pulses.   Abdomen: no tenderness, no masses palpated. No hepatosplenomegaly. Bowel sounds positive.  Musculoskeletal: no clubbing / cyanosis. No joint deformity upper and lower extremities. Good ROM, no contractures. Normal muscle tone.  Skin: no rashes, lesions, ulcers. No induration Neurologic: CN 2-12 grossly intact. Sensation intact, DTR normal. Strength 5/5 x all 4 extremities.  Psychiatric: Alert and oriented x person and place. Somewhat anxious and slightly depressed mood.    Labs on Admission: I have personally reviewed following labs and  imaging studies  CBC:  Recent Labs Lab 08/04/15 0759 08/05/15 1205 08/05/15 1235  WBC 6.4 6.2  --   NEUTROABS 3.6 4.1  --   HGB 11.0* 10.5* 11.2*  HCT 34.4* 33.5* 33.0*  MCV 94.8 93.6  --   PLT 214 200  --    Basic Metabolic Panel:  Recent Labs Lab 08/04/15 0759 08/05/15 1235  NA 141 141  K 4.3 5.0  CL 108 107  CO2 23  --   GLUCOSE 92 87  BUN 24* 30*  CREATININE 1.71* 1.70*  CALCIUM 9.0  --    GFR: Estimated Creatinine Clearance: 30.2 mL/min (by C-G formula based on Cr of 1.7). Liver Function Tests:  Recent Labs Lab 08/04/15 0759  AST 19  ALT 12*  ALKPHOS 60  BILITOT 0.5  PROT 7.3  ALBUMIN 3.3*   No results for input(s): LIPASE, AMYLASE in the last 168 hours. No results for input(s): AMMONIA in the last 168 hours. Coagulation Profile: No results for input(s): INR, PROTIME in the last 168 hours. Cardiac Enzymes: No results for input(s): CKTOTAL, CKMB, CKMBINDEX, TROPONINI in the last 168 hours. BNP (last 3 results) No results for input(s): PROBNP in the last 8760 hours. HbA1C: No results for input(s): HGBA1C in the last 72 hours. CBG: No results for input(s): GLUCAP in the last 168 hours. Lipid Profile: No results for input(s): CHOL, HDL, LDLCALC, TRIG, CHOLHDL, LDLDIRECT in the last 72 hours. Thyroid Function Tests: No results for input(s): TSH, T4TOTAL, FREET4, T3FREE, THYROIDAB in the last 72 hours. Anemia Panel: No results for input(s):  VITAMINB12, FOLATE, FERRITIN, TIBC, IRON, RETICCTPCT in the last 72 hours. Urine analysis:    Component Value Date/Time   COLORURINE YELLOW 08/04/2015 0749   APPEARANCEUR CLEAR 08/04/2015 0749   LABSPEC 1.020 08/04/2015 0749   PHURINE 6.0 08/04/2015 0749   GLUCOSEU NEGATIVE 08/04/2015 0749   HGBUR NEGATIVE 08/04/2015 0749   BILIRUBINUR NEGATIVE 08/04/2015 0749   KETONESUR NEGATIVE 08/04/2015 0749   PROTEINUR NEGATIVE 08/04/2015 0749   UROBILINOGEN 0.2 12/21/2014 1940   NITRITE NEGATIVE 08/04/2015 0749    LEUKOCYTESUR NEGATIVE 08/04/2015 0749   Sepsis Labs: (procalcitonin:4,lacticidven:4) )No results found for this or any previous visit (from the past 240 hour(s)).   Radiological Exams on Admission: Dg Chest 2 View  08/05/2015  CLINICAL DATA:  Shortness of breath today EXAM: CHEST  2 VIEW COMPARISON:  08/04/2015 FINDINGS: Cardiomegaly is noted. Cardiomegaly is noted. Central vascular congestion without convincing pulmonary edema. Small left pleural effusion. Mild degenerative changes thoracic spine. Streaky left basilar atelectasis or infiltrate. IMPRESSION: Cardiomegaly. Central mild vascular congestion without convincing pulmonary edema. Small left pleural effusion. Streaky left basilar atelectasis or infiltrate. Electronically Signed   By: Natasha Mead M.D.   On: 08/05/2015 13:24   Dg Abd Acute W/chest  08/04/2015  CLINICAL DATA:  82 year old male with acute abdominal pain, distention, and nausea this morning. Initial encounter. EXAM: DG ABDOMEN ACUTE W/ 1V CHEST COMPARISON:  Chest radiographs 07/04/2015 and earlier. Abdominal series 95621, CT Abdomen and Pelvis 06/23/2015, and earlier FINDINGS: AP views of the chest. Lower lung volumes with mildly increased patchy opacity about both hila. This could reflect crowding. Stable cardiac size and mediastinal contours. No pneumothorax. No pneumoperitoneum. No pleural effusion identified. IVC filter re- demonstrated. Non obstructed bowel gas pattern. Numerous pelvic phleboliths again noted. Scoliosis and degenerative changes in the spine. No pneumoperitoneum on left-side-down lateral decubitus view of the abdomen. No acute osseous abnormality identified. IMPRESSION: 1.  Normal bowel gas pattern, no free air. 2. Lower lung volumes, with increased perihilar opacity possibly due to crowding/atelectasis. If there is new cough then consider developing respiratory infection. Electronically Signed   By: Odessa Fleming M.D.   On: 08/04/2015 08:15    EKG:  (Independently reviewed) atrial fibrillation with underlying right bundle branch block, QTC 438 ms chronic abnormal appearing ST segments with elevation in 3 and depression in aVL unchanged from EKG in 2000  Assessment/Plan Principal Problem:   Diastolic CHF, acute  -At best appears to be a very mild exacerbation with BNP in the 600s and transient shortness of breath -Baseline dry weight appears to have changed noting today's weight is 200 and weight at time of discharge last month 207; in review of old weights in the past year back in March his weight was 185-patient's daughter suspects patient has put on actual body weight therefore history of weight may have changed -Was given Lasix 40 mg IV 1 the ER -Lasix at time of discharge last month had been decreased from twice a day to daily and will continue with next oral dosage due tomorrow -No ACE inhibitor secondary to renal insufficiency -Continue Cardura, Apresoline and Bystolic  Active Problems:   Anxiety disorder/Insomnia -This seems to be patient's most pressing issue noting that since her most recent discharge to assisted living facility this appears to have worsened -Also exacerbated by his wife's progressively declining health; [facility] -Xanax as well as Depakote have been unsuccessful in controlling patient's insomnia and persistent anxiety; PCP recommended starting trazodone at hour sleep  which was to begin tonight so we will start this admission -PCP also recommended if trazodone was not helpful could utilize Seroquel instead -Daughter reports patient's neurologist suspects there may be a degree of either evolving dementia and/or PTSD at play as well -Continue preadmission Cymbalta -As recommended by PCP Xanax as well as Depakote discontinued    Chronic respiratory failure with hypoxia  -Patient has outpatient orders for continuous nasal cannula oxygen during the day and prn use  nocturnally -Daughter reports patient is noncompliant with utilization of oxygen and this could be contributing to his shortness of breath, fatigue and deconditioning as well as heart failure exacerbations -I suspect he may benefit from continuous oxygen day and night have instructed patient to wear oxygen continuously at time of admission -As noted suspected degree of deconditioning so have asked PT and OT to evaluate patient    CKD (chronic kidney disease), stage III -Renal function stable and near baseline of 24 and 1.71 although at times creatinine has been as high as 2.39    PAF (paroxysmal atrial fibrillation)  -Rate controlled -Continue Xarelto    Bilateral edema of lower extremity/History of DVT (deep vein thrombosis) -Chronic in nature -Previously ascribed TED hose -Patient reports excessive daytime sleepiness and sits with his feet dependence of this likely contributing to edema -Patient also hypoalbuminemic with albumin 3.3    Essential hypertension -Currently controlled on above-stated cardiac medications    GERD -Patient reporting difficulty swallowing foods and a sensation of food getting stuck -SLP evaluation; may benefit from esophagram -Did not appear to be on PPI or H2 blocker prior to admission    HLD (hyperlipidemia) -Continue Zocor      DVT prophylaxis: Xarelto  Code Status: DO NOT RESUSCITATE  Family Communication: Daughter, Juventino Slovak bedside Disposition Plan: Anticipate discharge back to assisted living facility pending PT/OT evaluation Consults called: None Admission status: Observation/telemetry    ELLIS,ALLISON L. ANP-BC [facility] Pager [phone]   If 7PM-7AM, please contact night-coverage [email or web address] Password [email or web address]  08/05/2015, 3:07 PM

## 2015-08-06 ENCOUNTER — Telehealth: Payer: Self-pay | Admitting: *Deleted

## 2015-08-06 ENCOUNTER — Ambulatory Visit (HOSPITAL_BASED_OUTPATIENT_CLINIC_OR_DEPARTMENT_OTHER): Payer: Medicare Other

## 2015-08-06 ENCOUNTER — Observation Stay (HOSPITAL_COMMUNITY): Payer: Medicare Other

## 2015-08-06 DIAGNOSIS — F418 Other specified anxiety disorders: Secondary | ICD-10-CM | POA: Diagnosis not present

## 2015-08-06 DIAGNOSIS — I5031 Acute diastolic (congestive) heart failure: Secondary | ICD-10-CM | POA: Diagnosis not present

## 2015-08-06 DIAGNOSIS — I509 Heart failure, unspecified: Secondary | ICD-10-CM | POA: Diagnosis not present

## 2015-08-06 DIAGNOSIS — J9611 Chronic respiratory failure with hypoxia: Secondary | ICD-10-CM | POA: Diagnosis not present

## 2015-08-06 DIAGNOSIS — R0600 Dyspnea, unspecified: Secondary | ICD-10-CM

## 2015-08-06 DIAGNOSIS — R600 Localized edema: Secondary | ICD-10-CM | POA: Diagnosis not present

## 2015-08-06 DIAGNOSIS — R05 Cough: Secondary | ICD-10-CM | POA: Diagnosis not present

## 2015-08-06 DIAGNOSIS — F331 Major depressive disorder, recurrent, moderate: Secondary | ICD-10-CM | POA: Diagnosis not present

## 2015-08-06 LAB — ECHOCARDIOGRAM COMPLETE
HEIGHTINCHES: 69 in
WEIGHTICAEL: 3284.8 [oz_av]

## 2015-08-06 LAB — BASIC METABOLIC PANEL
Anion gap: 7 (ref 5–15)
BUN: 19 mg/dL (ref 6–20)
CHLORIDE: 108 mmol/L (ref 101–111)
CO2: 25 mmol/L (ref 22–32)
Calcium: 8.4 mg/dL — ABNORMAL LOW (ref 8.9–10.3)
Creatinine, Ser: 1.76 mg/dL — ABNORMAL HIGH (ref 0.61–1.24)
GFR calc non Af Amer: 32 mL/min — ABNORMAL LOW (ref >60)
GFR, EST AFRICAN AMERICAN: 37 mL/min — AB (ref >60)
Glucose, Bld: 77 mg/dL (ref 65–99)
POTASSIUM: 3.4 mmol/L — AB (ref 3.5–5.1)
SODIUM: 140 mmol/L (ref 135–145)

## 2015-08-06 MED ORDER — POTASSIUM CHLORIDE CRYS ER 20 MEQ PO TBCR
40.0000 meq | EXTENDED_RELEASE_TABLET | Freq: Once | ORAL | Status: AC
  Administered 2015-08-06: 40 meq via ORAL
  Filled 2015-08-06: qty 2

## 2015-08-06 NOTE — Progress Notes (Signed)
Patient ID: Jason [name], male   DOB: December 27, 1919, 82 y.o.   MRN: [id number]    PROGRESS NOTE    Jason [name]  WUJ:[id number] DOB: Jan 02, 1920 DOA: 08/05/2015  PCP: Leo Grosser, MD   Brief Narrative:  82 y.o. male with chronic diastolic heart failure, chronic hypoxemia on daytime oxygen while exerting and prn nocturnal oxygen, paroxysmal atrial fibrillation on Xarelto, hypertension, chronic kidney disease, chronic bilateral extremity edema, history of DVT, history of carotid endarterectomy. He was sent to the hospital from the facility with reports of generalized weakness and shortness of breath   Assessment & Plan:   Diastolic CHF, acute  - Lasix at time of discharge last month had been decreased from twice a day to daily  - weight trend since admission: 200 --> 205 lbs this AM - No ACE inhibitor secondary to renal insufficiency - Continue Cardura, Apresoline and Bystolic - monitor daily weights, strict I/O   Anxiety disorder/Insomnia - Continue preadmission Cymbalta - As recommended by PCP Xanax as well as Depakote discontinued - sitter at bedside    Chronic respiratory failure with hypoxia  - Patient has outpatient orders for continuous nasal cannula oxygen during the day and prn use nocturnally - will request CT chest for clearer evaluation  - has been refusing oxygen    CKD (chronic kidney disease), stage III, hypokalemia - Cr at baseline - supplement K - BMP in AM   PAF (paroxysmal atrial fibrillation)  - Rate controlled - Continue Xarelto   Bilateral edema of lower extremity/History of DVT (deep vein thrombosis) - chronic, ECHO requested as last one done in 2016, notable for preserved EF but grade I diastolic CHF    Essential hypertension - Currently controlled on above-stated cardiac medications   GERD - Patient reporting difficulty swallowing foods and a sensation of food getting stuck - SLP evaluation; may benefit from esophagram   HLD  (hyperlipidemia) - Continue Zocor    Obesity - Body mass index is 30.3 kg/(m^2).   DVT prophylaxis: On Xarelto  Code Status: DNR Family Communication: Patient at bedside  Disposition Plan: SNF in 1-2 days   Consultants:   None  Procedures:   None  Antimicrobials:   None   Subjective: Reports feeling better. Somewhat confused, trying to get out of the bed.   Objective: Filed Vitals:   08/05/15 1400 08/05/15 1535 08/05/15 2122 08/06/15 0517  BP: 119/71 118/73 128/64 117/76  Pulse: 84 73  95  Temp:  97.3 F (36.3 C) 98.4 F (36.9 C) 98.3 F (36.8 C)  TempSrc:  Oral Oral Oral  Resp: 34 Height:   (1.753 m)    Weight:  92.761 kg (204 lb 8 oz)  93.123 kg (205 lb 4.8 oz)  SpO2: 99% 98%  96%    Intake/Output Summary (Last 24 hours) at 08/06/15 0655 Last data filed at 08/06/15 0103  Gross per 24 hour  Intake    240 ml  Output   1100 ml  Net   -860 ml   Filed Weights   08/05/15 1206 08/05/15 1535 08/06/15 0517  Weight: 90.719 kg (200 lb) 92.761 kg (204 lb 8 oz) 93.123 kg (205 lb 4.8 oz)    Examination:  General exam: Appears agitated a bit, trying to get out of the bed but after reassurance, more calm  Respiratory system: Crackles at bases with diminished breath sounds  Cardiovascular system: IRRR. No JVD, rubs, gallops or clicks. No pedal edema. Gastrointestinal system: Abdomen  is nondistended, soft and nontender. Central nervous system: Alert, confused, moving all 4 extremities spontaneously   Data Reviewed: I have personally reviewed following labs and imaging studies  CBC:  Recent Labs Lab 08/04/15 0759 08/05/15 1205 08/05/15 1235  WBC 6.4 6.2  --   NEUTROABS 3.6 4.1  --   HGB 11.0* 10.5* 11.2*  HCT 34.4* 33.5* 33.0*  MCV 94.8 93.6  --   PLT 214 200  --    Basic Metabolic Panel:  Recent Labs Lab 08/04/15 0759 08/05/15 1235 08/06/15 0402  NA 141 141 140  K 4.3 5.0 3.4*  CL 108 107 108  CO2 23  --  25  GLUCOSE 92 87 77    BUN 24* 30* 19  CREATININE 1.71* 1.70* 1.76*  CALCIUM 9.0  --  8.4*   Liver Function Tests:  Recent Labs Lab 08/04/15 0759  AST 19  ALT 12*  ALKPHOS 60  BILITOT 0.5  PROT 7.3  ALBUMIN 3.3*   Urine analysis:    Component Value Date/Time   COLORURINE YELLOW 08/04/2015 0749   APPEARANCEUR CLEAR 08/04/2015 0749   LABSPEC 1.020 08/04/2015 0749   PHURINE 6.0 08/04/2015 0749   GLUCOSEU NEGATIVE 08/04/2015 0749   HGBUR NEGATIVE 08/04/2015 0749   BILIRUBINUR NEGATIVE 08/04/2015 0749   KETONESUR NEGATIVE 08/04/2015 0749   PROTEINUR NEGATIVE 08/04/2015 0749   UROBILINOGEN 0.2 12/21/2014 1940   NITRITE NEGATIVE 08/04/2015 0749   LEUKOCYTESUR NEGATIVE 08/04/2015 0749   Recent Results (from the past 240 hour(s))  MRSA PCR Screening     Status: None   Collection Time: 08/05/15  5:39 PM  Result Value Ref Range Status   MRSA by PCR NEGATIVE NEGATIVE Final      Radiology Studies: Dg Chest 2 View 08/05/2015 Cardiomegaly. Central mild vascular congestion without convincing pulmonary edema. Small left pleural effusion. Streaky left basilar atelectasis or infiltrate.   Dg Abd Acute W/chest 08/04/2015 Normal bowel gas pattern, no free air. 2. Lower lung volumes, with increased perihilar opacity possibly due to crowding/atelectasis. If there is new cough then consider developing respiratory infection.    Scheduled Meds: . antiseptic oral rinse  7 mL Mouth Rinse BID  . aspirin EC  81 mg Oral Daily  . dexlansoprazole  60 mg Oral Daily  . doxazosin  4 mg Oral Daily  . DULoxetine  30 mg Oral Daily  . finasteride  5 mg Oral Daily  . furosemide  40 mg Oral Daily  . hydrALAZINE  50 mg Oral TID  . ipratropium  2 spray Each Nare QID  . latanoprost  1 drop Both Eyes QHS  . mirabegron ER  25 mg Oral Daily  . nebivolol  2.5 mg Oral Daily  . polyethylene glycol  17 g Oral BID  . polyvinyl alcohol  2 drop Both Eyes TID  . Rivaroxaban  15 mg Oral Q supper  . simvastatin  40 mg Oral q1800  .  sodium chloride flush  3 mL Intravenous Q12H  . traZODone  50 mg Oral QHS   Continuous Infusions:  Time spent: 20 minutes    Debbora PrestoMAGICK-Alicya Bena, MD [facility] Pager [phone]  If 7PM-7AM, please contact night-coverage [email or web address] Password [email or web address] 08/06/2015, 6:55 AM

## 2015-08-06 NOTE — Consult Note (Signed)
   [facility] CM Inpatient Consult   08/06/2015  Jason [name] 02-27-1920 [id number]  Patient screened for potential [place] Management services. Patient is eligible for [place] with his [facility]  Met with the patient at bedside.  Patient alert and oriented x 3 but anxious and stating his," wife is at [place] need to go and see her because, she's dying."  Patient very [place] as well.  Occupational Therapist came in to assess for ADLs and post hospital needs. Patient states he resides at [place] and has nursing there that assist with medication.  Meals are prepared there as well. [place] Management services not appropriate at this time as patient is quite anxious and disposition to be determined.   For questions please contact for referrals :   Natividad Brood, RN BSN [facility] Liaison  [phone] business mobile phone Toll free office [phone]

## 2015-08-06 NOTE — Evaluation (Signed)
SLP Cancellation Note  Patient Details Name: Jason [name] MRN: [id number] DOB: December 26, 1919   Cancelled treatment:       Reason Eval/Treat Not Completed: Patient at procedure or test/unavailable (pt having ECHO, will continue efforts)   Chales AbrahamsKimball, Rhonin Trott Ann 08/06/2015, 10:44 AM  Donavan Burnetamara Marquelle Musgrave, MS [facility] SLP [phone]

## 2015-08-06 NOTE — Evaluation (Signed)
Occupational Therapy Evaluation Patient Details Name: Jason [name] MRN: [id number] DOB: [birth year] Today's Date: 08/06/2015    History of Present Illness 82 yo admitted from ALF due to acute diastolic heart failure. PMHx: Afib, CHF, HOH, CKD, CAD, PVD, HTN, PTSD, vertigo, CVA   Clinical Impression   Pt admitted with CHF. Pt currently with functional limitations due to the deficits listed below (see OT Problem List).  Pt will benefit from skilled OT to increase their safety and independence with ADL and functional mobility for ADL to facilitate discharge to venue listed below.      Follow Up Recommendations  Home health OT;Other (comment) (at ALF)    Equipment Recommendations  None recommended by OT       Precautions / Restrictions Precautions Precautions: Fall      Mobility Bed Mobility               General bed mobility comments: Pt up in chair.  Transfers Overall transfer level: Needs assistance Equipment used: Rolling walker (2 wheeled) Transfers: Sit to/from Stand Sit to Stand: Min assist         General transfer comment: Vc for hand placement    Balance Overall balance assessment: Needs assistance Sitting-balance support: No upper extremity supported;Feet supported Sitting balance-Leahy Scale: Fair     Standing balance support: Single extremity supported Standing balance-Leahy Scale: Poor Standing balance comment: support of walker and supervision for static standing                            ADL Overall ADL's : Needs assistance/impaired Eating/Feeding: Set up;Sitting   Grooming: Set up;Sitting   Upper Body Bathing: Minimal assitance;Sitting   Lower Body Bathing: Maximal assistance;Sit to/from stand   Upper Body Dressing : Minimal assistance;Sitting   Lower Body Dressing: Maximal assistance;Sit to/from stand   Toilet Transfer: Minimal Production designer, theatre/television/filmassistance;RW Toilet Transfer Details (indicate cue type and reason): sit to stand for  urinal Toileting- Clothing Manipulation and Hygiene: Moderate assistance;Sit to/from stand;Cueing for safety Toileting - Clothing Manipulation Details (indicate cue type and reason): VC to hold walker with 1 hand                       Pertinent Vitals/Pain Pain Assessment: No/denies pain     Hand Dominance Right   Extremity/Trunk Assessment Upper Extremity Assessment Upper Extremity Assessment: Generalized weakness   Lower Extremity Assessment Lower Extremity Assessment: Generalized weakness       Communication Communication Communication: HOH   Cognition Arousal/Alertness: Awake/alert Behavior During Therapy: WFL for tasks assessed/performed Overall Cognitive Status: No family/caregiver present to determine baseline cognitive functioning                     General Comments   pt wanted pants on. OT found hospital paper pants and Aed pt. Pt states they help him put on pants at ALF.              Home Living Family/patient expects to be discharged to:: Assisted living                             Home Equipment: Walker - 4 wheels;Shower seat;Hand held shower head;Grab bars - toilet;Grab bars - tub/shower;Hospital bed          Prior Functioning/Environment Level of Independence: Needs assistance  OT Diagnosis: Generalized weakness   OT Problem List: Decreased strength;Decreased safety awareness   OT Treatment/Interventions: Self-care/ADL training;DME and/or AE instruction;Patient/family education    OT Goals(Current goals can be found in the care plan section) Acute Rehab OT Goals Patient Stated Goal: see wife- she is at [facility] OT Goal Formulation: With patient Time For Goal Achievement: 08/20/15  OT Frequency: Min 2X/week              End of Session Equipment Utilized During Treatment: Engineer, water Communication: Mobility status  Activity Tolerance: Patient tolerated treatment well Patient left: in  chair;with call bell/phone within reach;with chair alarm set   Time: 1126-1145 OT Time Calculation (min): 19 min Charges:  OT General Charges $OT Visit: 1 Procedure OT Evaluation $OT Eval Moderate Complexity: 1 Procedure G-Codes: OT G-codes **NOT FOR INPATIENT CLASS** Functional Assessment Tool Used: clinical observation Functional Limitation: Self care Self Care Current Status (Z6109): At least 40 percent but less than 60 percent impaired, limited or restricted Self Care Goal Status (U0454): At least 1 percent but less than 20 percent impaired, limited or restricted  Jason [name], Jason [name] 08/06/2015, 12:02 PM

## 2015-08-06 NOTE — Evaluation (Addendum)
Physical Therapy Evaluation Patient Details Name: Jason [name] L Erny MRN: [id number] DOB: 08/07/1919 Today's Date: 08/06/2015   History of Present Illness  82 yo admitted from ALF due to acute diastolic heart failure. PMHx: Afib, CHF, HOH, CKD, CAD, PVD, HTN, PTSD, vertigo, CVA  Clinical Impression  Pt admitted with above diagnosis and presents to PT with functional limitations due to deficits listed below (See PT problem list). Pt needs skilled PT to maximize independence and safety to allow discharge to back to ALF with HHPT if ALF can continue to provide needed assistance.     Follow Up Recommendations Home health PT (at ALF if ALF can continue to provide needed assistance.)    Equipment Recommendations  None recommended by PT    Recommendations for Other Services       Precautions / Restrictions Precautions Precautions: Fall      Mobility  Bed Mobility               General bed mobility comments: Pt up in chair.  Transfers Overall transfer level: Needs assistance Equipment used: Rolling walker (2 wheeled) Transfers: Sit to/from Stand Sit to Stand: Min guard         General transfer comment: Verbal cues for hand placement and assist for balance  Ambulation/Gait Ambulation/Gait assistance: Min guard Ambulation Distance (Feet): 100 Feet Assistive device: Rolling walker (2 wheeled) Gait Pattern/deviations: Step-through pattern;Decreased stride length;Trunk flexed   Gait velocity interpretation: <1.8 ft/sec, indicative of risk for recurrent falls General Gait Details: Verbal cues to stay closer to walker. Verbal cues to push walker rather than pick it up. Pt amb on RA with SpO2 98% after amb.  Stairs            Wheelchair Mobility    Modified Rankin (Stroke Patients Only)       Balance Overall balance assessment: Needs assistance Sitting-balance support: No upper extremity supported;Feet supported Sitting balance-Leahy Scale: Fair     Standing  balance support: Single extremity supported Standing balance-Leahy Scale: Poor Standing balance comment: support of walker and supervision for static standing                             Pertinent Vitals/Pain Pain Assessment: No/denies pain    Home Living Family/patient expects to be discharged to:: Assisted living               Home Equipment: Walker - 4 wheels;Shower seat;Hand held shower head;Grab bars - toilet;Grab bars - tub/shower;Hospital bed      Prior Function Level of Independence: Needs assistance               Hand Dominance   Dominant Hand: Right    Extremity/Trunk Assessment   Upper Extremity Assessment: Defer to OT evaluation           Lower Extremity Assessment: Generalized weakness         Communication   Communication: HOH  Cognition Arousal/Alertness: Awake/alert Behavior During Therapy: WFL for tasks assessed/performed Overall Cognitive Status: No family/caregiver present to determine baseline cognitive functioning                      General Comments      Exercises        Assessment/Plan    PT Assessment Patient needs continued PT services  PT Diagnosis Difficulty walking;Generalized weakness   PT Problem List Decreased strength;Decreased activity tolerance;Decreased balance;Decreased mobility;Decreased knowledge of use of DME  PT Treatment Interventions DME instruction;Gait training;Functional mobility training;Therapeutic activities;Therapeutic exercise;Balance training;Patient/family education   PT Goals (Current goals can be found in the Care Plan section) Acute Rehab PT Goals Patient Stated Goal: Not stated PT Goal Formulation: Patient unable to participate in goal setting Time For Goal Achievement: 08/13/15 Potential to Achieve Goals: Good    Frequency Min 3X/week   Barriers to discharge        Co-evaluation               End of Session Equipment Utilized During Treatment: Gait  belt Activity Tolerance: Patient tolerated treatment well Patient left: in chair;with call bell/phone within reach;with chair alarm set Nurse Communication: Mobility status;Other (comment) (condom cath off)    Functional Assessment Tool Used: clinical judgement Functional Limitation: Mobility: Walking and moving around Mobility: Walking and Moving Around Current Status [phone]): At least 1 percent but less than 20 percent impaired, limited or restricted Mobility: Walking and Moving Around Goal Status [phone]): At least 1 percent but less than 20 percent impaired, limited or restricted    Time: 0903-0916 PT Time Calculation (min) (ACUTE ONLY): 13 min   Charges:   PT Evaluation $PT Eval Moderate Complexity: 1 Procedure     PT G Codes:   PT G-Codes **NOT FOR INPATIENT CLASS** Functional Assessment Tool Used: clinical judgement Functional Limitation: Mobility: Walking and moving around Mobility: Walking and Moving Around Current Status (W2956): At least 1 percent but less than 20 percent impaired, limited or restricted Mobility: Walking and Moving Around Goal Status [phone]): At least 1 percent but less than 20 percent impaired, limited or restricted    [facility] 08/06/2015, 11:17 AM [facility] PT [phone]

## 2015-08-06 NOTE — Progress Notes (Signed)
Pt in room attempting to get out of bed, pulled out IV, removed telemetry. Pt confused and not aware of his risk for falling. Dr. Izola PriceMyers paged regarding pt behavior and safety concern. Sitter ordered for bedside. Will continue to monitor pt.

## 2015-08-06 NOTE — Telephone Encounter (Signed)
Completed DNR forms.   Call placed to patient daughter [name] to advise to pick up at front desk.   LM on VM.

## 2015-08-06 NOTE — Care Management Obs Status (Signed)
MEDICARE OBSERVATION STATUS NOTIFICATION   Patient Details  Name: Jason [name] MRN: [id number] Date of Birth: 1919/08/27   Medicare Observation Status Notification Given:  Yes    MayoChapman Fitch, [place] Agostinelli T, RN 08/06/2015, 10:36 AM

## 2015-08-06 NOTE — Progress Notes (Signed)
Echocardiogram 2D Echocardiogram has been performed.  Jason [name], Jason [name] 08/06/2015, 12:16 PM

## 2015-08-07 LAB — CBC
HEMATOCRIT: 34.7 % — AB (ref 39.0–52.0)
HEMOGLOBIN: 11 g/dL — AB (ref 13.0–17.0)
MCH: 29.6 pg (ref 26.0–34.0)
MCHC: 31.7 g/dL (ref 30.0–36.0)
MCV: 93.5 fL (ref 78.0–100.0)
Platelets: 207 10*3/uL (ref 150–400)
RBC: 3.71 MIL/uL — ABNORMAL LOW (ref 4.22–5.81)
RDW: 17.6 % — ABNORMAL HIGH (ref 11.5–15.5)
WBC: 6.1 10*3/uL (ref 4.0–10.5)

## 2015-08-07 LAB — BASIC METABOLIC PANEL
ANION GAP: 12 (ref 5–15)
BUN: 17 mg/dL (ref 6–20)
CHLORIDE: 106 mmol/L (ref 101–111)
CO2: 22 mmol/L (ref 22–32)
Calcium: 8.9 mg/dL (ref 8.9–10.3)
Creatinine, Ser: 1.79 mg/dL — ABNORMAL HIGH (ref 0.61–1.24)
GFR calc Af Amer: 36 mL/min — ABNORMAL LOW (ref >60)
GFR, EST NON AFRICAN AMERICAN: 31 mL/min — AB (ref >60)
Glucose, Bld: 89 mg/dL (ref 65–99)
POTASSIUM: 3.7 mmol/L (ref 3.5–5.1)
SODIUM: 140 mmol/L (ref 135–145)

## 2015-08-07 LAB — TSH: TSH: 3.304 u[IU]/mL (ref 0.350–4.500)

## 2015-08-07 MED ORDER — HYDRALAZINE HCL 25 MG PO TABS: 25.0000 mg | ORAL_TABLET | Freq: Three times a day (TID) | ORAL | Status: DC

## 2015-08-07 MED ORDER — HYDRALAZINE HCL 25 MG PO TABS
25.0000 mg | ORAL_TABLET | Freq: Two times a day (BID) | ORAL | Status: DC
  Administered 2015-08-07 – 2015-08-12 (×11): 25 mg via ORAL
  Filled 2015-08-07 (×10): qty 1

## 2015-08-07 MED ORDER — IPRATROPIUM BROMIDE 0.06 % NA SOLN
2.0000 | Freq: Three times a day (TID) | NASAL | Status: DC
Start: 2015-08-07 — End: 2015-08-12
  Administered 2015-08-07 – 2015-08-12 (×13): 2 via NASAL
  Filled 2015-08-07: qty 15

## 2015-08-07 NOTE — Progress Notes (Signed)
Patient ID: Jason [name], male   DOB: Jun 15, 1919, 82 y.o.   MRN: [id number]    PROGRESS NOTE    RONI SCOW  NWG:[id number] DOB: 11-Aug-1919 DOA: 08/05/2015  PCP: Leo Grosser, MD   Brief Narrative:  82 y.o. male with chronic diastolic heart failure, chronic hypoxemia on daytime oxygen while exerting and prn nocturnal oxygen, paroxysmal atrial fibrillation on Xarelto, hypertension, chronic kidney disease, chronic bilateral extremity edema, history of DVT, history of carotid endarterectomy. He was sent to the hospital from the facility with reports of generalized weakness and shortness of breath   Assessment & Plan:   Diastolic CHF, acute  - Lasix at time of discharge last month had been decreased from twice a day to daily  - weight trend since admission: 200 --> 205 --> 202 lbs this AM - No ACE inhibitor secondary to renal insufficiency - Continue Cardura, Apresoline and Bystolic - monitor daily weights, strict I/O   Anxiety disorder/Insomnia.agiation  - likely component of dementia and ? Depression  - Continue preadmission Cymbalta - As recommended by PCP Xanax as well as Depakote discontinued - sitter at bedside for now - suspect pt will need higher level of care due to intermittent confusion and agitation episodes worse at night    Chronic respiratory failure with hypoxia  - Patient has outpatient orders for continuous nasal cannula oxygen during the day and prn use nocturnally - CT chest with minimal pleural effusions  - has been refusing oxygen  - continue lasix low dose    Thyroid nodule - 3.5 cm in size,  Review of records indicated this nodule was seen last ear on CT scan and was 3 cm in diameter - check TSH    CKD (chronic kidney disease), stage III, hypokalemia - Cr at baseline - K is WNL - BMP in AM   PAF (paroxysmal atrial fibrillation)  - Rate controlled - Continue Xarelto   Bilateral edema of lower extremity/History of DVT (deep vein  thrombosis) - chronic, ECHO requested as last one done in 2016, notable for preserved EF but grade I diastolic CHF  - repeat ECHO stable with preserved EF    Essential hypertension - Currently controlled on above-stated cardiac medications   GERD - Patient reporting difficulty swallowing foods and a sensation of food getting stuck - SLP evaluation; may benefit from esophagram   HLD (hyperlipidemia) - stop Zocor given advanced age and behavioral issues     Obesity - Body mass index is 30.3 kg/(m^2).   DVT prophylaxis: On Xarelto  Code Status: DNR Family Communication: Patient at bedside, daughter who is POA over the phone updated 20 minutes in duration, questions answered  Disposition Plan: SNF likely early next week   Consultants:   None  Procedures:   None  Antimicrobials:   None   Subjective: Reports feeling better. Somewhat confused but better this AM.   Objective: Filed Vitals:   08/06/15 2008 08/07/15 0335 08/07/15 0700 08/07/15 1247  BP: 131/75 140/82  128/57  Pulse: 99 106  104  Temp: 97.7 F (36.5 C) 97.7 F (36.5 C)  98.4 F (36.9 C)  TempSrc: Oral Oral  Oral  Resp: 20 22  20   Height:      Weight:   91.627 kg (202 lb)   SpO2: 93% 96%  95%    Intake/Output Summary (Last 24 hours) at 08/07/15 1344 Last data filed at 08/07/15 1241  Gross per 24 hour  Intake    710 ml  Output  1000 ml  Net   -290 ml   Filed Weights   08/05/15 1535 08/06/15 0517 08/07/15 0700  Weight: 92.761 kg (204 lb 8 oz) 93.123 kg (205 lb 4.8 oz) 91.627 kg (202 lb)    Examination:  General exam: Appears calm, eating breakfast  Respiratory system: Crackles at bases with diminished breath sounds  Cardiovascular system: IRRR. No JVD, rubs, soft SEM 2/6, no gallops or clicks. No pedal edema. Gastrointestinal system: Abdomen is nondistended, soft and nontender. Central nervous system: Alert, confused, moving all 4 extremities spontaneously   Data Reviewed: I have  personally reviewed following labs and imaging studies  CBC:  Recent Labs Lab 08/04/15 0759 08/05/15 1205 08/05/15 1235 08/07/15 0416  WBC 6.4 6.2  --  6.1  NEUTROABS 3.6 4.1  --   --   HGB 11.0* 10.5* 11.2* 11.0*  HCT 34.4* 33.5* 33.0* 34.7*  MCV 94.8 93.6  --  93.5  PLT 214 200  --  207   Basic Metabolic Panel:  Recent Labs Lab 08/04/15 0759 08/05/15 1235 08/06/15 0402 08/07/15 0416  NA 141 141 140 140  K 4.3 5.0 3.4* 3.7  CL 108 107 108 106  CO2 23  --  25 22  GLUCOSE 92 87 77 89  BUN 24* 30* 19 17  CREATININE 1.71* 1.70* 1.76* 1.79*  CALCIUM 9.0  --  8.4* 8.9   Liver Function Tests:  Recent Labs Lab 08/04/15 0759  AST 19  ALT 12*  ALKPHOS 60  BILITOT 0.5  PROT 7.3  ALBUMIN 3.3*   Urine analysis:    Component Value Date/Time   COLORURINE YELLOW 08/04/2015 0749   APPEARANCEUR CLEAR 08/04/2015 0749   LABSPEC 1.020 08/04/2015 0749   PHURINE 6.0 08/04/2015 0749   GLUCOSEU NEGATIVE 08/04/2015 0749   HGBUR NEGATIVE 08/04/2015 0749   BILIRUBINUR NEGATIVE 08/04/2015 0749   KETONESUR NEGATIVE 08/04/2015 0749   PROTEINUR NEGATIVE 08/04/2015 0749   UROBILINOGEN 0.2 12/21/2014 1940   NITRITE NEGATIVE 08/04/2015 0749   LEUKOCYTESUR NEGATIVE 08/04/2015 0749   Recent Results (from the past 240 hour(s))  MRSA PCR Screening     Status: None   Collection Time: 08/05/15  5:39 PM  Result Value Ref Range Status   MRSA by PCR NEGATIVE NEGATIVE Final    Radiology Studies: Dg Chest 2 View 08/05/2015 Cardiomegaly. Central mild vascular congestion without convincing pulmonary edema. Small left pleural effusion. Streaky left basilar atelectasis or infiltrate.   Dg Abd Acute W/chest 08/04/2015 Normal bowel gas pattern, no free air. 2. Lower lung volumes, with increased perihilar opacity possibly due to crowding/atelectasis. If there is new cough then consider developing respiratory infection.   Scheduled Meds: . antiseptic oral rinse  7 mL Mouth Rinse BID  . aspirin  EC  81 mg Oral Daily  . dexlansoprazole  60 mg Oral Daily  . doxazosin  4 mg Oral Daily  . DULoxetine  30 mg Oral Daily  . finasteride  5 mg Oral Daily  . furosemide  40 mg Oral Daily  . hydrALAZINE  25 mg Oral BID  . ipratropium  2 spray Each Nare TID  . latanoprost  1 drop Both Eyes QHS  . mirabegron ER  25 mg Oral Daily  . nebivolol  2.5 mg Oral Daily  . polyethylene glycol  17 g Oral BID  . polyvinyl alcohol  2 drop Both Eyes TID  . Rivaroxaban  15 mg Oral Q supper  . sodium chloride flush  3 mL Intravenous Q12H  .  traZODone  50 mg Oral QHS   Continuous Infusions:  Time spent: 20 minutes    Debbora Presto, MD [facility] Pager [phone]  If 7PM-7AM, please contact night-coverage [email or web address] Password [email or web address] 08/07/2015, 1:44 PM

## 2015-08-07 NOTE — Evaluation (Signed)
Clinical/Bedside Swallow Evaluation Patient Details  Name: Jason [name] L Dabney MRN: [id number] Date of Birth: [birth year]  Today's Date: 08/07/2015 Time: SLP Start Time (ACUTE ONLY): 0850 SLP Stop Time (ACUTE ONLY): 0905 SLP Time Calculation (min) (ACUTE ONLY): 15 min  Past Medical History:  Past Medical History  Diagnosis Date  . PVD (peripheral vascular disease) (HCC)   . CAD (coronary artery disease)     a. 12/2005 - PCI to OM  . Hyperlipidemia   . Cerebrovascular accident (HCC)   . DVT femoral (deep venous thrombosis) with thrombophlebitis (HCC) 03/2007    [name]/notes 07/13/2010  . GERD (gastroesophageal reflux disease)   . Edema   . Vertigo   . IBS (irritable bowel syndrome)   . Duodenitis   . Gastritis   . Esophageal stricture   . Cellulitis   . Pulmonary embolism (HCC)   . Prostatic hypertrophy   . Frequent falls   . Subdural hematoma (HCC)   . PTSD (post-traumatic stress disorder)   . Vertigo   . TIA (transient ischemic attack)     a. 01/2006 and 05/2006.   [name]. Hiatal hernia     periferal neuropathy  . Hx of echocardiogram     Echo 9/13:  Mild LVH, EF 60-65%, Gr 1 diast dysfn, mild AI, mild BAE  . Gout   . Chronic low back pain   . Gait disorder   . Peripheral neuropathy (HCC)   . Hydrocele   . History of shingles     Left lumbar  . HOH (hard of hearing)     hearing aid, totally in R ear ( no hearing aid in L )   . Myocardial infarction [facility](HCC) "years ago"    "dr said I'd had a small one"  . Daily headache   . Depression   . Urinary frequency   . Blind left eye   . Hypertension   . Dysrhythmia   . Arthritis   . Anxiety     PTSD  . Hemiparesis and alteration of sensations as late effects of stroke (HCC) 03/04/2015   Past Surgical History:  Past Surgical History  Procedure Laterality Date  . Hand surgery Right     "replaced bone in my finger"  . Angioplasty    . Carotid endarterectomy Left   . Coronary angioplasty with stent placement      left circumflex  .  Cataract extraction, bilateral    . Tonsillectomy    . Tympanic membrane repair Right 1978    [name]/notes 07/27/2010; "couldn'getting so t couldn't hear; had noise in my ear; didn't correct either  . Hand contracture release Right     palm/notes 07/27/2010  . Esophagogastroduodenoscopy (egd) with esophageal dilation    . Insertion of vena cava filter  2013    h/o PE  . Endarterectomy Left 11/02/2014    Procedure: LEFT  CAROTID ARTERY ENDARTERECTOMY  WITH DACRON PATCH ANGIOPLASTY;  Surgeon: Sherren Kernsharles E Fields, MD;  Location: [facility] OR;  Service: Vascular;  Laterality: Left;   HPI:  Pt is a 82 y.o. male with PMH significant for chronic diastolic heart failure, chronic hypoxemia on daytime oxygen while exerting and prn nocturnal oxygen, paroxysmal atrial fibrillation on Xarelto, hypertension, chronic kidney disease, chronic bilateral extremity edema, history of DVT, dyslipidemia, GERD, history of stroke, history of carotid endarterectomy. Admitted 5/25 with generalized weakness/ SOB. Pt also c/o globus sensation/ dry mouth. CXR 5/26 showed bibasilar opacity/ atelectasis. Bedside swallow eval ordered to further evaluate swallow function.   Assessment /  Plan / Recommendation Clinical Impression  Pt showed no overt s/s of aspiration at bedside across consistencies. Pt did have prolonged oral phase/ mastication of regular solid and some belching. Pt admitted to sensation of food getting stuck at times but was unable to describe the frequency or types of POs with which this occurs. Given these findings along with decreased respiratory status, aspiration risk appears mild-moderate. Recommend dysphagia 3 diet, thin liquids, meds whole with liquid or crushed in puree if difficulties arise, reflux precautions- ensure pt as upright as possible 30-60 minutes after meal. Pt agreeable to this recommendation. May benefit from further esophageal assessment. SLP will sign off at this time. Please re-consult if needs arise.      Aspiration Risk  Mild aspiration risk;Moderate aspiration risk    Diet Recommendation Dysphagia 3 (Mech soft);Thin liquid   Liquid Administration via: Cup;Straw Medication Administration: Whole meds with liquid Supervision: Patient able to self feed;Intermittent supervision to cue for compensatory strategies Compensations: Slow rate;Small sips/bites Postural Changes: Seated upright at 90 degrees;Remain upright for at least 30 minutes after po intake    Other  Recommendations Recommended Consults: Consider esophageal assessment Oral Care Recommendations: Oral care BID Other Recommendations: Clarify dietary restrictions   Follow up Recommendations  None    Frequency and Duration            Prognosis        Swallow Study   General HPI: Pt is a 82 y.o. male with PMH significant for chronic diastolic heart failure, chronic hypoxemia on daytime oxygen while exerting and prn nocturnal oxygen, paroxysmal atrial fibrillation on Xarelto, hypertension, chronic kidney disease, chronic bilateral extremity edema, history of DVT, dyslipidemia, GERD, history of stroke, history of carotid endarterectomy. Admitted 5/25 with generalized weakness/ SOB. Pt also c/o globus sensation/ dry mouth. CXR 5/26 showed bibasilar opacity/ atelectasis. Bedside swallow eval ordered to further evaluate swallow function. Type of Study: Bedside Swallow Evaluation Diet Prior to this Study: Regular;Thin liquids Temperature Spikes Noted: No Respiratory Status: Nasal cannula History of Recent Intubation: No Behavior/Cognition: Alert;Cooperative Oral Cavity Assessment: Within Functional Limits Oral Care Completed by SLP: No Oral Cavity - Dentition: Edentulous Vision: Functional for self-feeding Self-Feeding Abilities: Able to feed self Patient Positioning: Partially reclined (Pt insisted on partial recline- at ~60 degree angle) Baseline Vocal Quality: Hoarse Volitional Cough: Strong Volitional Swallow: Able to  elicit    Oral/Motor/Sensory Function Overall Oral Motor/Sensory Function: Within functional limits   Ice Chips Ice chips: Not tested   Thin Liquid Thin Liquid: Within functional limits Presentation: Cup;Straw    Nectar Thick Nectar Thick Liquid: Not tested   Honey Thick Honey Thick Liquid: Not tested   Puree Puree: Within functional limits Presentation: Self Fed;Spoon   Solid   GO   Solid: Impaired Presentation: Self Fed Oral Phase Functional Implications: Impaired mastication    Functional Assessment Tool Used: clinical judgment Functional Limitations: Swallowing Swallow Current Status (Z6109): At least 1 percent but less than 20 percent impaired, limited or restricted Swallow Goal Status [phone]): At least 1 percent but less than 20 percent impaired, limited or restricted Swallow Discharge Status [phone]): At least 1 percent but less than 20 percent impaired, limited or restricted   Metro Kung, MA, CCC-SLP 08/07/2015,9:13 AM [phone]

## 2015-08-07 NOTE — Clinical Social Work Note (Signed)
Clinical Social Work Assessment  Patient Details  Name: Jason [name] MRN: [id number] Date of Birth: [birth year]  Date of referral:  08/07/15               Reason for consult:  Facility Placement, Discharge Planning                Permission sought to share information with:  Facility Medical sales representativeContact Representative, Family Supports Permission granted to share information::  Yes, Verbal Permission Granted  Name::     Engineer, waterJayne  Agency::  SNF's  Relationship::  Daughter  Contact Information:  [phone]  Housing/Transportation Living arrangements for the past 2 months:  Assisted Living Facility [employer] [facility] Source of Information:  Medical Team, Adult Children Patient Interpreter Needed:  None Criminal Activity/Legal Involvement Pertinent to Current Situation/Hospitalization:    Significant Relationships:  Adult Children, Spouse Lives with:  Facility Resident Do you feel safe going back to the place where you live?  Yes Need for family participation in patient care:  Yes (Comment)  Care giving concerns:  PT recommending home health PT but MD and patient's daughter do not feel comfortable with sending patient back to ALF. MD will consult PT again for re-evaluation.   Social Worker assessment / plan:  Patient not fully oriented so CSW called patient's wife. CSW introduced role and explained that discharge planning would be discussed. [facility] but patient's daughter and MD do not feel comfortable sending him back there. PT recommendations are currently home health PT. MD will consult PT again for re-evaluation. Patient[place] [employer] [place] Patient is a veteran and patient's daughter has been in contact with [facility] social worker about possible placement. No further concerns. CSW encouraged patient's daughter to contact CSW as needed. CSW will continue to follow patient and his daughter for support and facilitate discharge to ALF/SNF pending re-evaluation once  medically stable.  Employment status:  Retired Database administratornsurance information:  Managed Medicare PT Recommendations:  Home with Home Health (PT will re-evaluate for SNF due to declining health and dementia) Information / Referral to community resources:  Skilled Nursing Facility  Patient/Family's Response to care:  Patient not fully oriented. Patient's daughter agreeable to SNF placement if PT re-evaluates and recommends it. Patient's family supportive and involved in patient's care. Patient's daughter polite and appreciated social work intervention.  Patient/Family's Understanding of and Emotional Response to Diagnosis, Current Treatment, and Prognosis:  Patient's daughter knowledgeable of medical interventions and aware of declining health. Patient's daughter aware of possibility of discharge to SNF once medically stable pending re-evaluation from PT.  Emotional Assessment Appearance:  Appears stated age Attitude/Demeanor/Rapport:  Unable to Assess (Patient not fully oriented. CSW called daughter.) Affect (typically observed):  Unable to Assess (CSW called daughter.) Orientation:  Oriented to Self, Oriented to Place, Oriented to  Time, Oriented to Situation (Fluctuating affect possibly caused by dementia) Alcohol / Substance use:  Never Used Psych involvement (Current and /or in the community):  No (Comment)  Discharge Needs  Concerns to be addressed:  Care Coordination Readmission within the last 30 days:  No Current discharge risk:  Cognitively Impaired, Dependent with Mobility Barriers to Discharge:  Other (Waiting on PT re-evaluation. Currently recommending home health but patient health declining and has dementia)   Margarito LinerSarah C Alajah Witman, LCSW 08/07/2015, 10:18 AM

## 2015-08-08 ENCOUNTER — Observation Stay (HOSPITAL_COMMUNITY): Payer: Medicare Other

## 2015-08-08 DIAGNOSIS — R0600 Dyspnea, unspecified: Secondary | ICD-10-CM | POA: Diagnosis not present

## 2015-08-08 LAB — BASIC METABOLIC PANEL
ANION GAP: 9 (ref 5–15)
BUN: 17 mg/dL (ref 6–20)
CO2: 25 mmol/L (ref 22–32)
Calcium: 8.6 mg/dL — ABNORMAL LOW (ref 8.9–10.3)
Chloride: 106 mmol/L (ref 101–111)
Creatinine, Ser: 1.74 mg/dL — ABNORMAL HIGH (ref 0.61–1.24)
GFR calc Af Amer: 37 mL/min — ABNORMAL LOW (ref >60)
GFR, EST NON AFRICAN AMERICAN: 32 mL/min — AB (ref >60)
Glucose, Bld: 82 mg/dL (ref 65–99)
POTASSIUM: 3.4 mmol/L — AB (ref 3.5–5.1)
SODIUM: 140 mmol/L (ref 135–145)

## 2015-08-08 LAB — CBC
HEMATOCRIT: 33.3 % — AB (ref 39.0–52.0)
HEMOGLOBIN: 10.1 g/dL — AB (ref 13.0–17.0)
MCH: 28.6 pg (ref 26.0–34.0)
MCHC: 30.3 g/dL (ref 30.0–36.0)
MCV: 94.3 fL (ref 78.0–100.0)
Platelets: 193 10*3/uL (ref 150–400)
RBC: 3.53 MIL/uL — ABNORMAL LOW (ref 4.22–5.81)
RDW: 17.5 % — AB (ref 11.5–15.5)
WBC: 4.7 10*3/uL (ref 4.0–10.5)

## 2015-08-08 MED ORDER — FUROSEMIDE 10 MG/ML IJ SOLN
40.0000 mg | Freq: Every day | INTRAMUSCULAR | Status: DC
  Administered 2015-08-08 – 2015-08-10 (×3): 40 mg via INTRAVENOUS
  Filled 2015-08-08 (×3): qty 4

## 2015-08-08 MED ORDER — POTASSIUM CHLORIDE CRYS ER 20 MEQ PO TBCR
40.0000 meq | EXTENDED_RELEASE_TABLET | Freq: Once | ORAL | Status: AC
  Administered 2015-08-08: 40 meq via ORAL
  Filled 2015-08-08: qty 2

## 2015-08-08 MED ORDER — OXYCODONE-ACETAMINOPHEN 5-325 MG PO TABS
1.0000 | ORAL_TABLET | ORAL | Status: DC | PRN
  Administered 2015-08-08 – 2015-08-12 (×6): 2 via ORAL
  Filled 2015-08-08 (×6): qty 2

## 2015-08-08 MED ORDER — HALOPERIDOL LACTATE 5 MG/ML IJ SOLN
2.0000 mg | Freq: Once | INTRAMUSCULAR | Status: AC
  Administered 2015-08-08: 2 mg via INTRAVENOUS
  Filled 2015-08-08: qty 1

## 2015-08-08 NOTE — Progress Notes (Addendum)
Patient ID: Jason [name], male   DOB: Feb 16, 1920, 82 y.o.   MRN: [id number]    PROGRESS NOTE    Jason [name]  UXL:[id number] DOB: 10-04-1919 DOA: 08/05/2015  PCP: Leo Grosser, MD   Brief Narrative:  82 y.o. male with chronic diastolic heart failure, chronic hypoxemia on daytime oxygen while exerting and prn nocturnal oxygen, paroxysmal atrial fibrillation on Xarelto, hypertension, chronic kidney disease, chronic bilateral extremity edema, history of DVT, history of carotid endarterectomy. He was sent to the hospital from the facility with reports of generalized weakness and shortness of breath   Assessment & Plan:   Diastolic CHF, acute  - Lasix at time of discharge last month had been decreased from twice a day to daily  - weight trend since admission: 200 --> 205 --> 202 --> 197 lbs this AM - No ACE inhibitor secondary to renal insufficiency - Continue Cardura, Apresoline and Bystolic - since more crackles on exam this Am and worsening LE edema, will change lasix to IV  - repeat CXR today for follow up  - monitor daily weights, strict I/O   Anxiety disorder/Insomnia.agiation  - likely component of dementia and ? Depression  - Continue preadmission Cymbalta - As recommended by PCP Xanax as well as Depakote discontinued - keep sitter at bedside, pt is at high risk fall  - suspect pt will need higher level of care due to intermittent confusion and agitation episodes worse at night    Chronic respiratory failure with hypoxia  - Patient has outpatient orders for continuous nasal cannula oxygen during the day and prn use nocturnally - CT chest with minimal pleural effusions but pt more dyspneic this AM  - has been refusing oxygen  - continue lasix but change to IV for better diuresis as pt with with dyspnea and worsening LE edema     Thyroid nodule - 3.5 cm in size,  Review of records indicated this nodule was seen last ear on CT scan and was 3 cm in diameter - check TSH  (still pending)    CKD (chronic kidney disease), stage III, hypokalemia - Cr at baseline - K is low so will need to supplement  - BMP in AM   PAF (paroxysmal atrial fibrillation)  - Rate controlled - Continue Xarelto   Bilateral edema of lower extremity/History of DVT (deep vein thrombosis) - chronic, ECHO requested as last one done in 2016, notable for preserved EF but grade I diastolic CHF  - repeat ECHO stable with preserved EF    Essential hypertension - Currently controlled on above-stated cardiac medications   GERD - Patient reporting difficulty swallowing foods and a sensation of food getting stuck - SLP evaluation; may benefit from esophagram   HLD (hyperlipidemia) - stop Zocor given advanced age and behavioral issues     Obesity - Body mass index is 30.3 kg/(m^2).   DVT prophylaxis: On Xarelto  Code Status: DNR Family Communication: Patient at bedside, daughter who is POA over the phone updated  Disposition Plan: SNF likely early next week   Consultants:   None  Procedures:   None  Antimicrobials:   None   Subjective: Somewhat confused, reports more dyspnea this AM   Objective: Filed Vitals:   08/07/15 1247 08/07/15 2205 08/08/15 0640 08/08/15 0900  BP: 128/57 134/75 148/82 125/64  Pulse: 104 98 108 82  Temp: 98.4 F (36.9 C) 98.2 F (36.8 C) 98 F (36.7 C) 98.2 F (36.8 C)  TempSrc: Oral Oral Oral Oral  Resp: 20 20 20 18   Height:      Weight:   89.404 kg (197 lb 1.6 oz)   SpO2: 95% 96% 97% 94%    Intake/Output Summary (Last 24 hours) at 08/08/15 1056 Last data filed at 08/08/15 0641  Gross per 24 hour  Intake    580 ml  Output   1025 ml  Net   -445 ml   Filed Weights   08/06/15 0517 08/07/15 0700 08/08/15 0640  Weight: 93.123 kg (205 lb 4.8 oz) 91.627 kg (202 lb) 89.404 kg (197 lb 1.6 oz)    Examination:  General exam: Appears calm, eating breakfast  Respiratory system: Crackles at bases but worse on the left side, with  diminished breath sounds  Cardiovascular system: IRRR. No JVD, rubs, soft SEM 2/6, no gallops or clicks. Bilateral LE edema +1-2 with chronic venous stasis changes  Gastrointestinal system: Abdomen is nondistended, soft and nontender. Central nervous system: Alert, confused, moving all 4 extremities spontaneously   Data Reviewed: I have personally reviewed following labs and imaging studies  CBC:  Recent Labs Lab 08/04/15 0759 08/05/15 1205 08/05/15 1235 08/07/15 0416 08/08/15 0414  WBC 6.4 6.2  --  6.1 4.7  NEUTROABS 3.6 4.1  --   --   --   HGB 11.0* 10.5* 11.2* 11.0* 10.1*  HCT 34.4* 33.5* 33.0* 34.7* 33.3*  MCV 94.8 93.6  --  93.5 94.3  PLT 214 200  --  207 193   Basic Metabolic Panel:  Recent Labs Lab 08/04/15 0759 08/05/15 1235 08/06/15 0402 08/07/15 0416 08/08/15 0414  NA 141 141 140 140 140  K 4.3 5.0 3.4* 3.7 3.4*  CL 108 107 108 106 106  CO2 23  --  25 22 25   GLUCOSE 92 87 77 89 82  BUN 24* 30* 19 17 17   CREATININE 1.71* 1.70* 1.76* 1.79* 1.74*  CALCIUM 9.0  --  8.4* 8.9 8.6*   Liver Function Tests:  Recent Labs Lab 08/04/15 0759  AST 19  ALT 12*  ALKPHOS 60  BILITOT 0.5  PROT 7.3  ALBUMIN 3.3*   Urine analysis:    Component Value Date/Time   COLORURINE YELLOW 08/04/2015 0749   APPEARANCEUR CLEAR 08/04/2015 0749   LABSPEC 1.020 08/04/2015 0749   PHURINE 6.0 08/04/2015 0749   GLUCOSEU NEGATIVE 08/04/2015 0749   HGBUR NEGATIVE 08/04/2015 0749   BILIRUBINUR NEGATIVE 08/04/2015 0749   KETONESUR NEGATIVE 08/04/2015 0749   PROTEINUR NEGATIVE 08/04/2015 0749   UROBILINOGEN 0.2 12/21/2014 1940   NITRITE NEGATIVE 08/04/2015 0749   LEUKOCYTESUR NEGATIVE 08/04/2015 0749   Recent Results (from the past 240 hour(s))  MRSA PCR Screening     Status: None   Collection Time: 08/05/15  5:39 PM  Result Value Ref Range Status   MRSA by PCR NEGATIVE NEGATIVE Final    Radiology Studies: Dg Chest 2 View 08/05/2015 Cardiomegaly. Central mild vascular  congestion without convincing pulmonary edema. Small left pleural effusion. Streaky left basilar atelectasis or infiltrate.   Dg Abd Acute W/chest 08/04/2015 Normal bowel gas pattern, no free air. 2. Lower lung volumes, with increased perihilar opacity possibly due to crowding/atelectasis. If there is new cough then consider developing respiratory infection.   Scheduled Meds: . antiseptic oral rinse  7 mL Mouth Rinse BID  . aspirin EC  81 mg Oral Daily  . dexlansoprazole  60 mg Oral Daily  . doxazosin  4 mg Oral Daily  . DULoxetine  30 mg Oral Daily  . finasteride  5 mg Oral Daily  . furosemide  40 mg Intravenous Daily  . hydrALAZINE  25 mg Oral BID  . ipratropium  2 spray Each Nare TID  . latanoprost  1 drop Both Eyes QHS  . mirabegron ER  25 mg Oral Daily  . nebivolol  2.5 mg Oral Daily  . polyethylene glycol  17 g Oral BID  . polyvinyl alcohol  2 drop Both Eyes TID  . potassium chloride  40 mEq Oral Once  . Rivaroxaban  15 mg Oral Q supper  . sodium chloride flush  3 mL Intravenous Q12H  . traZODone  50 mg Oral QHS   Continuous Infusions:  Time spent: 20 minutes   Debbora PrestoMAGICK-MYERS, ISKRA, MD [facility] Pager [phone]  If 7PM-7AM, please contact night-coverage [email or web address] Password [email or web address] 08/08/2015, 10:56 AM

## 2015-08-08 NOTE — Progress Notes (Signed)
Pt is a high fall risk continues to get out of bed and or chair. All precautions set into place, pt is alert and oriented, however doesn't realize his mobility limitations.

## 2015-08-08 NOTE — Discharge Instructions (Signed)

## 2015-08-09 DIAGNOSIS — I739 Peripheral vascular disease, unspecified: Secondary | ICD-10-CM | POA: Diagnosis present

## 2015-08-09 DIAGNOSIS — G309 Alzheimer's disease, unspecified: Secondary | ICD-10-CM | POA: Diagnosis present

## 2015-08-09 DIAGNOSIS — L03114 Cellulitis of left upper limb: Secondary | ICD-10-CM | POA: Diagnosis not present

## 2015-08-09 DIAGNOSIS — I130 Hypertensive heart and chronic kidney disease with heart failure and stage 1 through stage 4 chronic kidney disease, or unspecified chronic kidney disease: Secondary | ICD-10-CM | POA: Diagnosis present

## 2015-08-09 DIAGNOSIS — G629 Polyneuropathy, unspecified: Secondary | ICD-10-CM | POA: Diagnosis present

## 2015-08-09 DIAGNOSIS — R1311 Dysphagia, oral phase: Secondary | ICD-10-CM | POA: Diagnosis not present

## 2015-08-09 DIAGNOSIS — K219 Gastro-esophageal reflux disease without esophagitis: Secondary | ICD-10-CM | POA: Diagnosis present

## 2015-08-09 DIAGNOSIS — R262 Difficulty in walking, not elsewhere classified: Secondary | ICD-10-CM | POA: Diagnosis not present

## 2015-08-09 DIAGNOSIS — I10 Essential (primary) hypertension: Secondary | ICD-10-CM | POA: Diagnosis not present

## 2015-08-09 DIAGNOSIS — N183 Chronic kidney disease, stage 3 (moderate): Secondary | ICD-10-CM | POA: Diagnosis not present

## 2015-08-09 DIAGNOSIS — G8190 Hemiplegia, unspecified affecting unspecified side: Secondary | ICD-10-CM | POA: Diagnosis present

## 2015-08-09 DIAGNOSIS — Z955 Presence of coronary angioplasty implant and graft: Secondary | ICD-10-CM | POA: Diagnosis not present

## 2015-08-09 DIAGNOSIS — Z9841 Cataract extraction status, right eye: Secondary | ICD-10-CM | POA: Diagnosis not present

## 2015-08-09 DIAGNOSIS — M109 Gout, unspecified: Secondary | ICD-10-CM | POA: Diagnosis present

## 2015-08-09 DIAGNOSIS — E785 Hyperlipidemia, unspecified: Secondary | ICD-10-CM | POA: Diagnosis not present

## 2015-08-09 DIAGNOSIS — M7989 Other specified soft tissue disorders: Secondary | ICD-10-CM | POA: Diagnosis not present

## 2015-08-09 DIAGNOSIS — F4310 Post-traumatic stress disorder, unspecified: Secondary | ICD-10-CM | POA: Diagnosis present

## 2015-08-09 DIAGNOSIS — G4700 Insomnia, unspecified: Secondary | ICD-10-CM | POA: Diagnosis not present

## 2015-08-09 DIAGNOSIS — I5033 Acute on chronic diastolic (congestive) heart failure: Secondary | ICD-10-CM | POA: Diagnosis present

## 2015-08-09 DIAGNOSIS — F418 Other specified anxiety disorders: Secondary | ICD-10-CM | POA: Diagnosis not present

## 2015-08-09 DIAGNOSIS — F419 Anxiety disorder, unspecified: Secondary | ICD-10-CM | POA: Diagnosis not present

## 2015-08-09 DIAGNOSIS — R1314 Dysphagia, pharyngoesophageal phase: Secondary | ICD-10-CM | POA: Diagnosis not present

## 2015-08-09 DIAGNOSIS — J9691 Respiratory failure, unspecified with hypoxia: Secondary | ICD-10-CM | POA: Diagnosis not present

## 2015-08-09 DIAGNOSIS — J9611 Chronic respiratory failure with hypoxia: Secondary | ICD-10-CM | POA: Diagnosis not present

## 2015-08-09 DIAGNOSIS — I5031 Acute diastolic (congestive) heart failure: Secondary | ICD-10-CM | POA: Diagnosis not present

## 2015-08-09 DIAGNOSIS — F331 Major depressive disorder, recurrent, moderate: Secondary | ICD-10-CM | POA: Diagnosis not present

## 2015-08-09 DIAGNOSIS — F329 Major depressive disorder, single episode, unspecified: Secondary | ICD-10-CM | POA: Diagnosis present

## 2015-08-09 DIAGNOSIS — Z86718 Personal history of other venous thrombosis and embolism: Secondary | ICD-10-CM | POA: Diagnosis not present

## 2015-08-09 DIAGNOSIS — F0280 Dementia in other diseases classified elsewhere without behavioral disturbance: Secondary | ICD-10-CM | POA: Diagnosis present

## 2015-08-09 DIAGNOSIS — Z6830 Body mass index (BMI) 30.0-30.9, adult: Secondary | ICD-10-CM | POA: Diagnosis not present

## 2015-08-09 DIAGNOSIS — I480 Paroxysmal atrial fibrillation: Secondary | ICD-10-CM | POA: Diagnosis not present

## 2015-08-09 DIAGNOSIS — I509 Heart failure, unspecified: Secondary | ICD-10-CM | POA: Diagnosis not present

## 2015-08-09 DIAGNOSIS — E041 Nontoxic single thyroid nodule: Secondary | ICD-10-CM | POA: Diagnosis present

## 2015-08-09 DIAGNOSIS — Z7901 Long term (current) use of anticoagulants: Secondary | ICD-10-CM | POA: Diagnosis not present

## 2015-08-09 DIAGNOSIS — Z9842 Cataract extraction status, left eye: Secondary | ICD-10-CM | POA: Diagnosis not present

## 2015-08-09 DIAGNOSIS — M25512 Pain in left shoulder: Secondary | ICD-10-CM | POA: Diagnosis not present

## 2015-08-09 DIAGNOSIS — Z7982 Long term (current) use of aspirin: Secondary | ICD-10-CM | POA: Diagnosis not present

## 2015-08-09 DIAGNOSIS — R52 Pain, unspecified: Secondary | ICD-10-CM | POA: Diagnosis not present

## 2015-08-09 DIAGNOSIS — M6281 Muscle weakness (generalized): Secondary | ICD-10-CM | POA: Diagnosis not present

## 2015-08-09 DIAGNOSIS — E669 Obesity, unspecified: Secondary | ICD-10-CM | POA: Diagnosis present

## 2015-08-09 DIAGNOSIS — R600 Localized edema: Secondary | ICD-10-CM | POA: Diagnosis not present

## 2015-08-09 DIAGNOSIS — H9190 Unspecified hearing loss, unspecified ear: Secondary | ICD-10-CM | POA: Diagnosis present

## 2015-08-09 DIAGNOSIS — Z66 Do not resuscitate: Secondary | ICD-10-CM | POA: Diagnosis present

## 2015-08-09 DIAGNOSIS — R7989 Other specified abnormal findings of blood chemistry: Secondary | ICD-10-CM | POA: Diagnosis present

## 2015-08-09 DIAGNOSIS — E876 Hypokalemia: Secondary | ICD-10-CM | POA: Diagnosis not present

## 2015-08-09 DIAGNOSIS — I252 Old myocardial infarction: Secondary | ICD-10-CM | POA: Diagnosis not present

## 2015-08-09 DIAGNOSIS — I2510 Atherosclerotic heart disease of native coronary artery without angina pectoris: Secondary | ICD-10-CM | POA: Diagnosis present

## 2015-08-09 NOTE — Progress Notes (Signed)
Physical Therapy Treatment Patient Details Name: Jason [name] MRN: [id number] DOB: 1919-12-17 Today's Date: 08/09/2015    History of Present Illness 82 y.o. admitted from ALF due to acute diastolic heart failure. PMHx: Afib, CHF, HOH, CKD, CAD, PVD, HTN, PTSD, vertigo, CVA    PT Comments    Pt progressing well with mobility, he ambulated 130' with RW and min/guard assist, no loss of balance, SaO2 96% on RA.   Follow Up Recommendations  Home health PT (at ALF if ALF can continue to provide needed assistance.)     Equipment Recommendations  None recommended by PT    Recommendations for Other Services       Precautions / Restrictions Precautions Precautions: Fall Restrictions Weight Bearing Restrictions: No    Mobility  Bed Mobility Overal bed mobility: Needs Assistance Bed Mobility: Supine to Sit;Sit to Supine     Supine to sit: Supervision Sit to supine: Modified independent (Device/Increase time)   General bed mobility comments: Pt up in chair.  Transfers Overall transfer level: Needs assistance Equipment used: Rolling walker (2 wheeled) Transfers: Sit to/from Stand Sit to Stand: Min guard         General transfer comment: Vc for hand placement  Ambulation/Gait Ambulation/Gait assistance: Min guard Ambulation Distance (Feet): 130 Feet Assistive device: Rolling walker (2 wheeled) Gait Pattern/deviations: Step-through pattern;Decreased step length - right;Decreased step length - left   Gait velocity interpretation: at or above normal speed for age/gender General Gait Details: SaO2 97% on RA with ambulation, no LOB, distance limited by fatigue   Stairs            Wheelchair Mobility    Modified Rankin (Stroke Patients Only)       Balance     Sitting balance-Leahy Scale: Fair       Standing balance-Leahy Scale: Poor                      Cognition Arousal/Alertness: Awake/alert Behavior During Therapy: WFL for tasks  assessed/performed Overall Cognitive Status: No family/caregiver present to determine baseline cognitive functioning (history of dementia)                      Exercises      General Comments        Pertinent Vitals/Pain Pain Assessment: No/denies pain Pain Score: 3  Pain Location: neck and left hand Pain Descriptors / Indicators: Sore Pain Intervention(s): Monitored during session    Home Living                      Prior Function            PT Goals (current goals can now be found in the care plan section) Acute Rehab PT Goals Patient Stated Goal: not stated PT Goal Formulation: Patient unable to participate in goal setting Time For Goal Achievement: 08/13/15 Potential to Achieve Goals: Good Progress towards PT goals: Progressing toward goals    Frequency  Min 3X/week    PT Plan Current plan remains appropriate    Co-evaluation             End of Session Equipment Utilized During Treatment: Gait belt Activity Tolerance: Patient tolerated treatment well Patient left: in chair;with call bell/phone within reach;with chair alarm set     Time: 4098-11911031-1049 PT Time Calculation (min) (ACUTE ONLY): 18 min  Charges:  $Gait Training: 8-22 mins  G Codes:      Tamala Ser 08/09/2015, 10:59 AM [phone]

## 2015-08-09 NOTE — Progress Notes (Addendum)
Patient ID: Jason [name], male   DOB: 08-30-1919, 82 y.o.   MRN: [id number]    PROGRESS NOTE    Jason [name]  [name] DOB: 02-18-1920 DOA: 08/05/2015  PCP: Jason [name], Jason [name]   Brief Narrative:  82 y.o. male with chronic diastolic heart failure, chronic hypoxemia on daytime oxygen while exerting and prn nocturnal oxygen, paroxysmal atrial fibrillation on Xarelto, hypertension, chronic kidney disease, chronic bilateral extremity edema, history of DVT, history of carotid endarterectomy. He was sent to the hospital from the facility with reports of generalized weakness and shortness of breath   Assessment & Plan:   Diastolic CHF, acute  - Lasix at time of discharge last month had been decreased from twice a day to daily  - weight trend since admission: 200 --> 205 --> 202 --> 197 --> 200 lbs this AM - No ACE inhibitor secondary to renal insufficiency - Continue Cardura, Apresoline and Bystolic - CXR with no signs of CHF but pt still with crackles and LE edema, has refused IV Lasix - there is still significant amount of confusion and will discuss with daughter if psych eval for competency would be reasonable  - monitor daily weights, strict I/O   Anxiety disorder/Insomnia.agiation  - likely component of dementia and ? Depression  - Continue preadmission Cymbalta - there is still significant amount of confusion and will discuss with daughter if psych eval for competency would be reasonable  - sitter at bedside requested but not available, has gotten dose of Haldol yesterday and now sleeping hard - suspect pt will need higher level of care due to intermittent confusion and agitation episodes worse at night    Chronic respiratory failure with hypoxia  - Patient has outpatient orders for continuous nasal cannula oxygen during the day and prn use nocturnally - CT chest with minimal pleural effusions  - has been refusing oxygen and lasix IV    Thyroid nodule - 3.5 cm in size,   Review of records indicated this nodule was seen last ear on CT scan and was 3 cm in diameter - outpatient follow up   CKD (chronic kidney disease), stage III, hypokalemia - Cr at baseline - K pending this AM  - BMP in AM   PAF (paroxysmal atrial fibrillation)  - Rate controlled - Continue Xarelto   Bilateral edema of lower extremity/History of DVT (deep vein thrombosis) - chronic, ECHO requested as last one done in 2016, notable for preserved EF but grade I diastolic CHF  - repeat ECHO stable with preserved EF  - still with worsening edema - refusing lasix    Essential hypertension - Currently controlled on above-stated cardiac medications   GERD - Patient reporting difficulty swallowing foods and a sensation of food getting stuck - SLP evaluation; may benefit from esophagram   HLD (hyperlipidemia) - stop Zocor given advanced age and behavioral issues     Obesity - Body mass index is 30.3 kg/(m^2).   DVT prophylaxis: On Xarelto  Code Status: DNR Family Communication: Patient at bedside, daughter who is POA over the phone updated 20 minutes in duration, questions answered  Disposition Plan: SNF when bed available, still awaiting PT re evaluation   Consultants:   None  Procedures:   None  Antimicrobials:   None   Subjective: Sleepy but easy to awake, confused, wants to go home.   Objective: Filed Vitals:   08/08/15 0900 08/08/15 1300 08/08/15 2152 08/09/15 0646  BP: 125/64 118/71 136/71 132/66  Pulse: 82 87  82 80  Temp: 98.2 F (36.8 C) 98.4 F (36.9 C) 97.9 F (36.6 C) 98 F (36.7 C)  TempSrc: Oral Oral Oral Oral  Resp: Height:      Weight:    90.9 kg (200 lb 6.4 oz)  SpO2: 94% 95%  93%    Intake/Output Summary (Last 24 hours) at 08/09/15 1610 Last data filed at 08/08/15 1856  Gross per 24 hour  Intake    240 ml  Output    550 ml  Net   -310 ml   Filed Weights   08/07/15 0700 08/08/15 0640 08/09/15 0646  Weight: 91.627  kg (202 lb) 89.404 kg (197 lb 1.6 oz) 90.9 kg (200 lb 6.4 oz)    Examination:  General exam: Sleepy, NAD Respiratory system: Crackles at bases with diminished breath sounds  Cardiovascular system: IRRR. No JVD, rubs, soft SEM 2/6, no gallops or clicks.Bilateral LE edema +2  Gastrointestinal system: Abdomen is nondistended, soft and nontender. Central nervous system: sleeping but easy to awake, moving all 4 extremities   Data Reviewed: I have personally reviewed following labs and imaging studies  CBC:  Recent Labs Lab 08/04/15 0759 08/05/15 1205 08/05/15 1235 08/07/15 0416 08/08/15 0414  WBC 6.4 6.2  --  6.1 4.7  NEUTROABS 3.6 4.1  --   --   --   HGB 11.0* 10.5* 11.2* 11.0* 10.1*  HCT 34.4* 33.5* 33.0* 34.7* 33.3*  MCV 94.8 93.6  --  93.5 94.3  PLT 214 200  --  207 193   Basic Metabolic Panel:  Recent Labs Lab 08/04/15 0759 08/05/15 1235 08/06/15 0402 08/07/15 0416 08/08/15 0414  NA 141 141 140 140 140  K 4.3 5.0 3.4* 3.7 3.4*  CL 108 107 108 106 106  CO2 23  --  GLUCOSE 92 87 77 89 82  BUN 24* 30* CREATININE 1.71* 1.70* 1.76* 1.79* 1.74*  CALCIUM 9.0  --  8.4* 8.9 8.6*   Liver Function Tests:  Recent Labs Lab 08/04/15 0759  AST 19  ALT 12*  ALKPHOS 60  BILITOT 0.5  PROT 7.3  ALBUMIN 3.3*   Urine analysis:    Component Value Date/Time   COLORURINE YELLOW 08/04/2015 0749   APPEARANCEUR CLEAR 08/04/2015 0749   LABSPEC 1.020 08/04/2015 0749   PHURINE 6.0 08/04/2015 0749   GLUCOSEU NEGATIVE 08/04/2015 0749   HGBUR NEGATIVE 08/04/2015 0749   BILIRUBINUR NEGATIVE 08/04/2015 0749   KETONESUR NEGATIVE 08/04/2015 0749   PROTEINUR NEGATIVE 08/04/2015 0749   UROBILINOGEN 0.2 12/21/2014 1940   NITRITE NEGATIVE 08/04/2015 0749   LEUKOCYTESUR NEGATIVE 08/04/2015 0749   Recent Results (from the past 240 hour(s))  MRSA PCR Screening     Status: None   Collection Time: 08/05/15  5:39 PM  Result Value Ref Range Status   MRSA by PCR  NEGATIVE NEGATIVE Final    Radiology Studies: Dg Chest 2 View 08/05/2015 Cardiomegaly. Central mild vascular congestion without convincing pulmonary edema. Small left pleural effusion. Streaky left basilar atelectasis or infiltrate.   Dg Abd Acute W/chest 08/04/2015 Normal bowel gas pattern, no free air. 2. Lower lung volumes, with increased perihilar opacity possibly due to crowding/atelectasis. If there is new cough then consider developing respiratory infection.   Scheduled Meds: . antiseptic oral rinse  7 mL Mouth Rinse BID  . aspirin EC  81 mg Oral Daily  . dexlansoprazole  60 mg Oral Daily  . doxazosin  4  mg Oral Daily  . DULoxetine  30 mg Oral Daily  . finasteride  5 mg Oral Daily  . furosemide  40 mg Intravenous Daily  . hydrALAZINE  25 mg Oral BID  . ipratropium  2 spray Each Nare TID  . latanoprost  1 drop Both Eyes QHS  . mirabegron ER  25 mg Oral Daily  . nebivolol  2.5 mg Oral Daily  . polyethylene glycol  17 g Oral BID  . polyvinyl alcohol  2 drop Both Eyes TID  . Rivaroxaban  15 mg Oral Q supper  . sodium chloride flush  3 mL Intravenous Q12H  . traZODone  50 mg Oral QHS   Continuous Infusions:  Time spent: 20 minutes    Jason [name], ISKRA, Jason [name] [facility] Pager [phone]  If 7PM-7AM, please contact night-coverage [email or web address] Password [email or web address] 08/09/2015, 7:22 AM

## 2015-08-09 NOTE — Progress Notes (Addendum)
Occupational Therapy Treatment Patient Details Name: Jason [name] MRN: [id number] DOB: September 11, 1919 Today's Date: 08/09/2015    History of present illness 82 y.o. admitted from ALF due to acute diastolic heart failure. PMHx: Afib, CHF, HOH, CKD, CAD, PVD, HTN, PTSD, vertigo, CVA   OT comments  Pt progressing. Feel pt will continue to benefit from acute OT to increase independence and activity tolerance prior to d/c.   Follow Up Recommendations  Home health OT;Supervision/Assistance - 24 hour (at ALF-if they can provide adequate assist.)    Equipment Recommendations  None recommended by OT    Recommendations for Other Services      Precautions / Restrictions Precautions Precautions: Fall Restrictions Weight Bearing Restrictions: No       Mobility Bed Mobility Overal bed mobility: Needs Assistance Bed Mobility: Supine to Sit;Sit to Supine     Supine to sit: Supervision Sit to supine: Modified independent (Device/Increase time)      Transfers Overall transfer level: Needs assistance   Transfers: Sit to/from Stand Sit to Stand: Min guard              Balance    Min guard for ambulation with RW.                                ADL Overall ADL's : Needs assistance/impaired Eating/Feeding: Sitting;Independent Eating/Feeding Details (indicate cue type and reason): drank water Grooming: Wash/dry face;Oral care;Applying deodorant;Brushing hair;Sitting;Standing;Minimal assistance Grooming Details (indicate cue type and reason): OT helped move pt's UB clothing for him to apply deodorant.  Upper Body Bathing: Minimal assitance;Standing Upper Body Bathing Details (indicate cue type and reason): washed/dryed armpits; assisted washing under right armpit             Toilet Transfer: Min guard;Ambulation;RW;BSC           Functional mobility during ADLs: Min guard;Rolling walker General ADL Comments: Educated on breathing technique.       Vision                      Perception     Praxis      Cognition  Awake/Alert Behavior During Therapy: WFL for tasks assessed/performed Overall Cognitive Status: No family/caregiver present to determine baseline cognitive functioning (history of dementia)                       Extremity/Trunk Assessment               Exercises     Shoulder Instructions       General Comments      Pertinent Vitals/ Pain       Pain Assessment: 0-10 Pain Score: 3  Pain Location: neck and left hand Pain Descriptors / Indicators: Sore Pain Intervention(s): Monitored during session  Home Living                                          Prior Functioning/Environment              Frequency Min 2X/week     Progress Toward Goals  OT Goals(current goals can now be found in the care plan section)  Progress towards OT goals: Progressing toward goals  Acute Rehab OT Goals Patient Stated Goal: not stated OT Goal Formulation: With patient Time For Goal Achievement:  08/20/15 ADL Goals Pt Will Perform Grooming: with modified independence;standing Pt Will Transfer to Toilet: with modified independence;ambulating;regular height toilet Pt Will Perform Toileting - Clothing Manipulation and hygiene: with modified independence;sit to/from stand  Plan Discharge plan needs to be updated    Co-evaluation                 End of Session Equipment Utilized During Treatment: Gait belt;Rolling walker;Oxygen   Activity Tolerance Patient tolerated treatment well-took breaks   Patient Left in bed;with call bell/phone within reach;with bed alarm set   Nurse Communication          Time: [id number] OT Time Calculation (min): 16 min  Charges: OT General Charges $OT Visit: 1 Procedure OT Treatments $Self Care/Home Management : 8-22 mins  Earlie RavelingStraub, Eren Puebla L OTR/L [phone] 08/09/2015, 9:46 AM

## 2015-08-10 DIAGNOSIS — R600 Localized edema: Secondary | ICD-10-CM

## 2015-08-10 DIAGNOSIS — F418 Other specified anxiety disorders: Secondary | ICD-10-CM

## 2015-08-10 DIAGNOSIS — J9611 Chronic respiratory failure with hypoxia: Secondary | ICD-10-CM

## 2015-08-10 DIAGNOSIS — F331 Major depressive disorder, recurrent, moderate: Secondary | ICD-10-CM | POA: Diagnosis present

## 2015-08-10 LAB — BASIC METABOLIC PANEL
ANION GAP: 9 (ref 5–15)
BUN: 19 mg/dL (ref 6–20)
CALCIUM: 8.5 mg/dL — AB (ref 8.9–10.3)
CHLORIDE: 104 mmol/L (ref 101–111)
CO2: 24 mmol/L (ref 22–32)
Creatinine, Ser: 1.79 mg/dL — ABNORMAL HIGH (ref 0.61–1.24)
GFR calc non Af Amer: 31 mL/min — ABNORMAL LOW (ref >60)
GFR, EST AFRICAN AMERICAN: 36 mL/min — AB (ref >60)
GLUCOSE: 82 mg/dL (ref 65–99)
POTASSIUM: 3.5 mmol/L (ref 3.5–5.1)
Sodium: 137 mmol/L (ref 135–145)

## 2015-08-10 LAB — CBC
HCT: 33.3 % — ABNORMAL LOW (ref 39.0–52.0)
Hemoglobin: 10.2 g/dL — ABNORMAL LOW (ref 13.0–17.0)
MCH: 28.5 pg (ref 26.0–34.0)
MCHC: 30.6 g/dL (ref 30.0–36.0)
MCV: 93 fL (ref 78.0–100.0)
Platelets: 198 K/uL (ref 150–400)
RBC: 3.58 MIL/uL — ABNORMAL LOW (ref 4.22–5.81)
RDW: 17.3 % — ABNORMAL HIGH (ref 11.5–15.5)
WBC: 5.7 K/uL (ref 4.0–10.5)

## 2015-08-10 MED ORDER — FUROSEMIDE 10 MG/ML IJ SOLN: 20.0000 mg | Freq: Every day | INTRAMUSCULAR | Status: DC

## 2015-08-10 MED ORDER — DOXYCYCLINE HYCLATE 100 MG PO TABS
100.0000 mg | ORAL_TABLET | Freq: Two times a day (BID) | ORAL | Status: DC
  Administered 2015-08-10 – 2015-08-12 (×4): 100 mg via ORAL
  Filled 2015-08-10 (×4): qty 1

## 2015-08-10 MED ORDER — DULOXETINE HCL 20 MG PO CPEP
40.0000 mg | ORAL_CAPSULE | Freq: Every day | ORAL | Status: DC
  Administered 2015-08-11 – 2015-08-12 (×2): 40 mg via ORAL
  Filled 2015-08-10 (×2): qty 2

## 2015-08-10 NOTE — Clinical Social Work Placement (Signed)
   CLINICAL SOCIAL WORK PLACEMENT  NOTE  Date:  08/10/2015  Patient Details  Name: Jason [name] MRN: [id number] Date of Birth: Aug 21, 1919  Clinical Social Work is seeking post-discharge placement for this patient at the Skilled  Nursing Facility level of care (*CSW will initial, date and re-position this form in  chart as items are completed):  Yes   Patient/family provided with [facility] Clinical Social Work Department's list of facilities offering this level of care within the geographic area requested by the patient (or if unable, by the patient's family).  Yes   Patient/family informed of their freedom to choose among providers that offer the needed level of care, that participate in Medicare, Medicaid or managed care program needed by the patient, have an available bed and are willing to accept the patient.  Yes   Patient/family informed of [facility] ownership interest [facility] and [facility], as well as of the fact that they are under no obligation to receive care at these facilities.  PASRR submitted to EDS on       PASRR number received on       Existing PASRR number confirmed on 08/10/15     FL2 transmitted to all facilities in geographic area requested by pt/family on 08/10/15     FL2 transmitted to all facilities within larger geographic area on       Patient informed that his/her managed care company has contracts with or will negotiate with certain facilities, including the following:            Patient/family informed of bed offers received.  Patient chooses bed at       Physician recommends and patient chooses bed at      Patient to be transferred to   on  .  Patient to be transferred to facility by       Patient family notified on   of transfer.  Name of family member notified:        PHYSICIAN       Additional Comment:    _______________________________________________ Margarito LinerSarah C Valen Mascaro, LCSW 08/10/2015, 12:08 PM

## 2015-08-10 NOTE — Progress Notes (Signed)
Patient ID: Jason [name], male   DOB: [birth year], 82 y.o.   MRN: [id number]    PROGRESS NOTE    Jason [name]  WUJ:[id number] DOB: [birth year] DOA: 08/05/2015  PCP: Leo GrosserPICKARD,[name], MD   Brief Narrative:  82 y.o. male with chronic diastolic heart failure, chronic hypoxemia on daytime oxygen while exerting and prn nocturnal oxygen, paroxysmal atrial fibrillation on Xarelto, hypertension, chronic kidney disease, chronic bilateral extremity edema, history of DVT, history of carotid endarterectomy. He was sent to the hospital from the facility with reports of generalized weakness and shortness of breath   Assessment & Plan:   Diastolic CHF, acute  - Lasix at time of discharge last month had been decreased from twice a day to daily  - weight trend since admission: 200 --> 205 --> 202 --> 197 --> 200 --> 196 lbs this AM - No ACE inhibitor secondary to renal insufficiency - Continue Cardura, Apresoline and Bystolic - CXR with no signs of CHF but pt still with crackles and LE edema - there is still significant amount of confusion, daughter has agreed with psych eval for competence and depression  - pt has refused IV lasix and I think this is mostly relate to his episodes of confusion  - monitor daily weights, strict I/O    Left hand pain and swelling - c/w cellulitis, ? DVT but pt already on Xarelto  - place on doxycycline    Anxiety disorder/Insomnia.agiation, depression  - likely component of dementia and ? Depression  - Continue preadmission Cymbalta - psych eval requested for competence and depression assessment  - sitter at bedside provided as pt high fall risk  - suspect pt will need higher level of care due to intermittent confusion and agitation episodes worse at night    Chronic respiratory failure with hypoxia  - Patient has outpatient orders for continuous nasal cannula oxygen during the day and prn use nocturnally - CT chest with minimal pleural effusions  - has been  refusing oxygen and lasix IV    Thyroid nodule - 3.5 cm in size,  Review of records indicated this nodule was seen last ear on CT scan and was 3 cm in diameter - outpatient follow up   CKD (chronic kidney disease), stage III, hypokalemia - Cr at baseline - BMP in AM   PAF (paroxysmal atrial fibrillation)  - Rate controlled - Continue Xarelto   Bilateral edema of lower extremity/History of DVT (deep vein thrombosis) - chronic, ECHO requested as last one done in 2016, notable for preserved EF but grade I diastolic CHF  - repeat ECHO stable with preserved EF  - still with worsening edema - refusing lasix    Essential hypertension - Currently controlled on above-stated cardiac medications   GERD - Patient reporting difficulty swallowing foods and a sensation of food getting stuck - SLP evaluation; may benefit from esophagram   HLD (hyperlipidemia) - stop Zocor given advanced age and behavioral issues     Obesity - Body mass index is 30.3 kg/(m^2).   DVT prophylaxis: On Xarelto  Code Status: DNR Family Communication: Patient at bedside, daughter who is POA over the phone updated Disposition Plan: SNF when bed available  Consultants:   None  Procedures:   None  Antimicrobials:   None   Subjective: Left hand pain and swelling. Confused   Objective: Filed Vitals:   08/09/15 2039 08/10/15 0411 08/10/15 0936 08/10/15 1200  BP: 117/60 130/72 122/66 130/69  Pulse: 84 82  90  Temp: 98.2 F (36.8 C) 98.8 F (37.1 C)  98.6 F (37 C)  TempSrc: Oral Oral  Oral  Resp: 18 19  18   Height:      Weight:  89.132 kg (196 lb 8 oz)    SpO2: 98% 99%  94%    Intake/Output Summary (Last 24 hours) at 08/10/15 1531 Last data filed at 08/10/15 1321  Gross per 24 hour  Intake   1080 ml  Output   1000 ml  Net     80 ml   Filed Weights   08/08/15 0640 08/09/15 0646 08/10/15 0411  Weight: 89.404 kg (197 lb 1.6 oz) 90.9 kg (200 lb 6.4 oz) 89.132 kg (196 lb 8 oz)     Examination:  General exam: Sleepy, NAD Respiratory system: Crackles at bases with diminished breath sounds  Cardiovascular system: IRRR. No JVD, rubs, soft SEM 2/6, no gallops or clicks.Bilateral LE edema +2  Gastrointestinal system: Abdomen is nondistended, soft and nontender. Central nervous system: alert, follows most of the commands, confused at times  EXT: LUE with erythema and edema, TTP  Data Reviewed: I have personally reviewed following labs and imaging studies  CBC:  Recent Labs Lab 08/04/15 0759 08/05/15 1205 08/05/15 1235 08/07/15 0416 08/08/15 0414 08/10/15 0318  WBC 6.4 6.2  --  6.1 4.7 5.7  NEUTROABS 3.6 4.1  --   --   --   --   HGB 11.0* 10.5* 11.2* 11.0* 10.1* 10.2*  HCT 34.4* 33.5* 33.0* 34.7* 33.3* 33.3*  MCV 94.8 93.6  --  93.5 94.3 93.0  PLT 214 200  --  207 193 198   Basic Metabolic Panel:  Recent Labs Lab 08/04/15 0759 08/05/15 1235 08/06/15 0402 08/07/15 0416 08/08/15 0414 08/10/15 0318  NA 141 141 140 140 140 137  K 4.3 5.0 3.4* 3.7 3.4* 3.5  CL 108 107 108 106 106 104  CO2 23  --  25 22 25 24   GLUCOSE 92 87 77 89 82 82  BUN 24* 30* 19 17 17 19   CREATININE 1.71* 1.70* 1.76* 1.79* 1.74* 1.79*  CALCIUM 9.0  --  8.4* 8.9 8.6* 8.5*   Liver Function Tests:  Recent Labs Lab 08/04/15 0759  AST 19  ALT 12*  ALKPHOS 60  BILITOT 0.5  PROT 7.3  ALBUMIN 3.3*   Urine analysis:    Component Value Date/Time   COLORURINE YELLOW 08/04/2015 0749   APPEARANCEUR CLEAR 08/04/2015 0749   LABSPEC 1.020 08/04/2015 0749   PHURINE 6.0 08/04/2015 0749   GLUCOSEU NEGATIVE 08/04/2015 0749   HGBUR NEGATIVE 08/04/2015 0749   BILIRUBINUR NEGATIVE 08/04/2015 0749   KETONESUR NEGATIVE 08/04/2015 0749   PROTEINUR NEGATIVE 08/04/2015 0749   UROBILINOGEN 0.2 12/21/2014 1940   NITRITE NEGATIVE 08/04/2015 0749   LEUKOCYTESUR NEGATIVE 08/04/2015 0749   Recent Results (from the past 240 hour(s))  MRSA PCR Screening     Status: None   Collection  Time: 08/05/15  5:39 PM  Result Value Ref Range Status   MRSA by PCR NEGATIVE NEGATIVE Final    Radiology Studies: Dg Chest 2 View 08/05/2015 Cardiomegaly. Central mild vascular congestion without convincing pulmonary edema. Small left pleural effusion. Streaky left basilar atelectasis or infiltrate.   Dg Abd Acute W/chest 08/04/2015 Normal bowel gas pattern, no free air. 2. Lower lung volumes, with increased perihilar opacity possibly due to crowding/atelectasis. If there is new cough then consider developing respiratory infection.   Scheduled Meds: . antiseptic oral rinse  7 mL Mouth Rinse  BID  . aspirin EC  81 mg Oral Daily  . dexlansoprazole  60 mg Oral Daily  . doxazosin  4 mg Oral Daily  . DULoxetine  30 mg Oral Daily  . finasteride  5 mg Oral Daily  . furosemide  40 mg Intravenous Daily  . hydrALAZINE  25 mg Oral BID  . ipratropium  2 spray Each Nare TID  . latanoprost  1 drop Both Eyes QHS  . mirabegron ER  25 mg Oral Daily  . nebivolol  2.5 mg Oral Daily  . polyethylene glycol  17 g Oral BID  . polyvinyl alcohol  2 drop Both Eyes TID  . Rivaroxaban  15 mg Oral Q supper  . sodium chloride flush  3 mL Intravenous Q12H  . traZODone  50 mg Oral QHS   Continuous Infusions:  Time spent: 20 minutes    Debbora Presto, MD [facility] Pager [phone]  If 7PM-7AM, please contact night-coverage [email or web address] Password [email or web address] 08/10/2015, 3:31 PM

## 2015-08-10 NOTE — Clinical Social Work Note (Addendum)
CSW left voicemail for Junie PanningHeather Oden, social worker at [facility] [place] letting her know that they are first preference for SNF since patient's wife is there as well.  Charlynn CourtSarah Gautham Hewins, CSW [phone]  3:28 pm CSW spoke with patient's daughter. [facility] no longer first preference. Patient's daughter has been in contact with [facility], Fuller CanadaQuiana Mock and they decided it was best if [facility]. CSW called and left voicemail for Ms. Mock regarding what information they needed for placement.  Charlynn CourtSarah Negar Sieler, CSW [phone]

## 2015-08-10 NOTE — Consult Note (Signed)
[facility] Consult   Reason for Consult:  Depression and capacity evaluation Referring Physician:  Dr. Doyle Askew Patient Identification: Jason [name] MRN:  [id number] Principal Diagnosis: Anxiety disorder Diagnosis:   Patient Active Problem List   Diagnosis Date Noted  . Insomnia [G47.00] 08/05/2015  . Chronic respiratory failure with hypoxia ([place]) [J96.11] 08/05/2015  . Hematuria [R31.9] 07/04/2015  . Hemiparesis and alteration of sensations as late effects of stroke ([place]) [Y85.027, I69.398] 03/04/2015  . Essential hypertension [I10] 12/22/2014  . Acute ischemic stroke ([place]) [I63.9] 12/22/2014  . HLD (hyperlipidemia) [E78.5]   . Bilateral edema of lower extremity [R60.0] 11/17/2014  . Diastolic CHF, acute ([place]) [I50.31] 11/17/2014  . Carotid stenosis [I65.29] 11/02/2014  . Orthostatic hypotension [I95.1] 06/29/2014  . Gait disorder [R26.9] 02/04/2014  . Hereditary and idiopathic peripheral neuropathy [G60.9] 02/04/2014  . Breast pain [N64.4] 09/08/2012  . Breast lump [N63] 09/08/2012  . OA (osteoarthritis) of knee [M17.9] 09/08/2012  . History of DVT (deep vein thrombosis) [Z86.718] 03/02/2012  . PAF (paroxysmal atrial fibrillation) ([place]) [I48.0] 12/09/2011  . Herpes zoster [B02.9] 12/02/2011  . Thrombocytopenia ([place]) [D69.6] 12/02/2011  . CAD (coronary artery disease) [I25.10] 12/02/2011  . CKD (chronic kidney disease), stage III [N18.3] 12/02/2011  . Chronic constipation [K59.00] 10/04/2010  . Irritable bowel syndrome [K58.9] 10/15/2007  . VERTIGO [R42] 06/25/2007  . Anxiety disorder [F41.9] 05/27/2007  . PERIPHERAL VASCULAR DISEASE [I73.9] 01/18/2007  . GERD [K21.9] 01/18/2007    Total Time spent with patient: 1 hour  Subjective:   Jason [name] is a 82 y.o. male patient admitted with generalized weakness and shortness of breath.  HPI:  Jason [name] is a 82 y.o. Male, Seen, chart reviewed for for the face-to-face psychiatric consultation and evaluation of  increased symptoms of depression, anxiety and insomnia. Patient appeared lying in his bed, decreased psychomotor activity, isolated, withdrawn and stated has been feeling sad, depressed knowing that his wife has been suffering with Alzheimer's dementia and also believes she is dying. Patient knows he has been in hospital and being a bone 82 years old and agree to take medication that he needs without any difficulties. Patient is also complaining about dry mouth during my evaluation and requested glass of water. Patient could not see well on his left side of the eye and cannot hear on left side. Patient stated he cannot see television and does not understand what is going on around him. Patient stated he has a 2 children. Patient remember being in [place] for 3 years and also has [facility] benefits, gets his medication from the [place] system. Patient denies suicidal ideation, intention or plans. Reportedly patient primary care physician has started antidepressant medications Cymbalta 30 mg daily and trazodone 50 mg daily which seems to be somewhat helpful. Patient reportedly cannot walk much but does walk a little with walker. Patient has been on oxygen by cannula. Will increase his Cymbalta to 40 mg daily and continue trazodone 50 mg daily and watch him while in the hospital for clinical improvement while undergoing medical care needed. Patient does not meet criteria for acute psychiatric hospitalization during my evaluation.  Past Psychiatric History: Depression and anxiety.  Risk to Self: Is patient at risk for suicide?: No Risk to Others:   Prior Inpatient Therapy:   Prior Outpatient Therapy:    Past Medical History:  Past Medical History  Diagnosis Date  . PVD (peripheral vascular disease) ([place])   . CAD (coronary artery disease)     a. 12/2005 -  PCI to OM  . Hyperlipidemia   . Cerebrovascular accident ([place])   . DVT femoral (deep venous thrombosis) with thrombophlebitis ([place]) 03/2007    Archie Endo 07/13/2010  .  GERD (gastroesophageal reflux disease)   . Edema   . Vertigo   . IBS (irritable bowel syndrome)   . Duodenitis   . Gastritis   . Esophageal stricture   . Cellulitis   . Pulmonary embolism ([place])   . Prostatic hypertrophy   . Frequent falls   . Subdural hematoma ([place])   . PTSD (post-traumatic stress disorder)   . Vertigo   . TIA (transient ischemic attack)     a. 01/2006 and 05/2006.   [name] Hiatal hernia     periferal neuropathy  . Hx of echocardiogram     Echo 9/13:  Mild LVH, EF 60-65%, Gr 1 diast dysfn, mild AI, mild BAE  . Gout   . Chronic low back pain   . Gait disorder   . Peripheral neuropathy ([place])   . Hydrocele   . History of shingles     Left lumbar  . HOH (hard of hearing)     hearing aid, totally in R ear ( no hearing aid in L )   . Myocardial infarction [facility]) "years ago"    "dr said I'd had a small one"  . Daily headache   . Depression   . Urinary frequency   . Blind left eye   . Hypertension   . Dysrhythmia   . Arthritis   . Anxiety     PTSD  . Hemiparesis and alteration of sensations as late effects of stroke ([place]) 03/04/2015    Past Surgical History  Procedure Laterality Date  . Hand surgery Right     "replaced bone in my finger"  . Angioplasty    . Carotid endarterectomy Left   . Coronary angioplasty with stent placement      left circumflex  . Cataract extraction, bilateral    . Tonsillectomy    . Tympanic membrane repair Right 1978    Archie Endo 07/27/2010; "couldn'getting so t couldn't hear; had noise in my ear; didn't correct either  . Hand contracture release Right     palm/notes 07/27/2010  . Esophagogastroduodenoscopy (egd) with esophageal dilation    . Insertion of vena cava filter  2013    h/o PE  . Endarterectomy Left 11/02/2014    Procedure: LEFT  CAROTID ARTERY ENDARTERECTOMY  WITH DACRON PATCH ANGIOPLASTY;  Surgeon: Elam Dutch, MD;  Location: [facility] OR;  Service: Vascular;  Laterality: Left;   Family History:  Family History  Problem  Relation Age of Onset  . Colon cancer Neg Hx   . Coronary artery disease Brother   . Heart attack Father   . Migraines Mother   . Breast cancer Sister   . Dementia Sister   . Renal Disease Brother    Family Psychiatric  History: Unknown Social History:  History  Alcohol Use  . 0.0 oz/week  . 0 Standard drinks or equivalent per week    Comment: wine occasionally     History  Drug Use No    Social History   Social History  . Marital Status: Married    Spouse Name: N/A  . Number of Children: 2  . Years of Education: HS   Occupational History  . retired    Social History Main Topics  . Smoking status: Never Smoker   . Smokeless tobacco: Never Used  . Alcohol Use:  0.0 oz/week    0 Standard drinks or equivalent per week     Comment: wine occasionally  . Drug Use: No  . Sexual Activity: No   Other Topics Concern  . 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   Patient drinks about 2 cups of caffeine daily.         Additional Social History:    Allergies:   Allergies  Allergen Reactions  . Ativan [Lorazepam] Other (See Comments)    unknown  . Augmentin [Amoxicillin-Pot Clavulanate] Nausea And Vomiting    daughter states he can tolerate Amoxicillin ok Profuse vomiting. Unable to answer penicillin questions as pt is from Nursing home and unable to provide answers to questions  . Belladonna Alkaloids     Unknown- on MAR  . Clavulanic Acid     Unknown- on MAR  . Corticosteroids     Unknown- on MAR  . Fludrocortisone Acetate Other (See Comments)    "leg swelling"  . Pantoprazole Other (See Comments)    Does not work for patient. Do not substitue protonix for Dexilant ([place]).   . Parabens     Unknown- on MAR  . Penicillins     Unknown- on MAR  . Scopace [Scopolamine] Other (See Comments)    *patch "Made him crazy" per patients daughter     Labs:  Results for  orders placed or performed during the hospital encounter of 08/05/15 (from the past 48 hour(s))  CBC     Status: Abnormal   Collection Time: 08/10/15  3:18 AM  Result Value Ref Range   WBC 5.7 4.0 - 10.5 K/uL   RBC 3.58 (L) 4.22 - 5.81 MIL/uL   Hemoglobin 10.2 (L) 13.0 - 17.0 g/dL   HCT 33.3 (L) 39.0 - 52.0 %   MCV 93.0 78.0 - 100.0 fL   MCH 28.5 26.0 - 34.0 pg   MCHC 30.6 30.0 - 36.0 g/dL   RDW 17.3 (H) 11.5 - 15.5 %   Platelets 198 150 - 400 K/uL  Basic metabolic panel     Status: Abnormal   Collection Time: 08/10/15  3:18 AM  Result Value Ref Range   Sodium 137 135 - 145 mmol/L   Potassium 3.5 3.5 - 5.1 mmol/L   Chloride 104 101 - 111 mmol/L   CO2 24 22 - 32 mmol/L   Glucose, Bld 82 65 - 99 mg/dL   BUN 19 6 - 20 mg/dL   Creatinine, Ser 1.79 (H) 0.61 - 1.24 mg/dL   Calcium 8.5 (L) 8.9 - 10.3 mg/dL   GFR calc non Af Amer 31 (L) >60 mL/min   GFR calc Af Amer 36 (L) >60 mL/min    Comment: (NOTE) The eGFR has been calculated using the CKD EPI equation. This calculation has not been validated in all clinical situations. eGFR's persistently <60 mL/min signify possible Chronic Kidney Disease.    Anion gap 9 5 - 15    Current Facility-Administered Medications  Medication Dose Route Frequency Provider Last Rate Last Dose  . 0.9 %  sodium chloride infusion  250 mL Intravenous PRN Samella Parr, NP      . acetaminophen (TYLENOL) tablet 650 mg  650 mg Oral Q4H PRN Samella Parr, NP   650 mg at 08/07/15 2155  . antiseptic oral rinse (CPC / CETYLPYRIDINIUM CHLORIDE 0.05%) solution 7 mL  7 mL Mouth Rinse BID  Geradine Girt, DO   7 mL at 08/10/15 1000  . aspirin EC tablet 81 mg  81 mg Oral Daily Samella Parr, NP   81 mg at 08/10/15 0935  . dexlansoprazole (DEXILANT) capsule 60 mg  60 mg Oral Daily Geradine Girt, DO   60 mg at 08/10/15 8527  . doxazosin (CARDURA) tablet 4 mg  4 mg Oral Daily Samella Parr, NP   4 mg at 08/10/15 0936  . DULoxetine (CYMBALTA) DR capsule 30 mg  30  mg Oral Daily Samella Parr, NP   30 mg at 08/10/15 0935  . finasteride (PROSCAR) tablet 5 mg  5 mg Oral Daily Samella Parr, NP   5 mg at 08/10/15 0936  . furosemide (LASIX) injection 40 mg  40 mg Intravenous Daily Theodis Blaze, MD   40 mg at 08/10/15 0935  . hydrALAZINE (APRESOLINE) tablet 25 mg  25 mg Oral BID Theodis Blaze, MD   25 mg at 08/10/15 0936  . ipratropium (ATROVENT) 0.06 % nasal spray 2 spray  2 spray Each Nare TID Theodis Blaze, MD   2 spray at 08/10/15 0946  . latanoprost (XALATAN) 0.005 % ophthalmic solution 1 drop  1 drop Both Eyes QHS Samella Parr, NP   1 drop at 08/09/15 2121  . mirabegron ER (MYRBETRIQ) tablet 25 mg  25 mg Oral Daily Samella Parr, NP   25 mg at 08/10/15 7824  . nebivolol (BYSTOLIC) tablet 2.5 mg  2.5 mg Oral Daily Samella Parr, NP   2.5 mg at 08/10/15 0936  . ondansetron (ZOFRAN) injection 4 mg  4 mg Intravenous Q6H PRN Samella Parr, NP      . oxyCODONE-acetaminophen (PERCOCET/ROXICET) 5-325 MG per tablet 1-2 tablet  1-2 tablet Oral Q4H PRN Theodis Blaze, MD   2 tablet at 08/10/15 0729  . polyethylene glycol (MIRALAX / GLYCOLAX) packet 17 g  17 g Oral BID Samella Parr, NP   17 g at 08/10/15 0935  . polyvinyl alcohol (LIQUIFILM TEARS) 1.4 % ophthalmic solution 2 drop  2 drop Both Eyes TID Samella Parr, NP   2 drop at 08/10/15 0946  . Rivaroxaban (XARELTO) tablet 15 mg  15 mg Oral Q supper Samella Parr, NP   15 mg at 08/08/15 1700  . senna-docusate (Senokot-S) tablet 1 tablet  1 tablet Oral QHS PRN Samella Parr, NP   1 tablet at 08/09/15 2120  . sodium chloride flush (NS) 0.9 % injection 3 mL  3 mL Intravenous Q12H Samella Parr, NP   3 mL at 08/10/15 1000  . sodium chloride flush (NS) 0.9 % injection 3 mL  3 mL Intravenous PRN Samella Parr, NP      . traZODone (DESYREL) tablet 50 mg  50 mg Oral QHS Samella Parr, NP   50 mg at 08/09/15 2120    Musculoskeletal: Strength & Muscle Tone: decreased Gait & Station: unable to  stand Patient leans: N/A  Psychiatric Specialty Exam: Physical Exam as per history and physical   ROS depression, anxiety, insomnia, generalized weakness and worried about his wife and his own multiple medical conditions and somewhat deterioration. No Fever-chills, No Headache, No changes with Vision or hearing, reports vertigo No problems swallowing food or Liquids, No Chest pain, Cough or Shortness of Breath, No Abdominal pain, No Nausea or Vommitting, Bowel movements are regular, No Blood in stool or Urine, No dysuria, No new skin  rashes or bruises, No new joints pains-aches,  No new weakness, tingling, numbness in any extremity, No recent weight gain or loss, No polyuria, polydypsia or polyphagia,   A full 10 point Review of Systems was done, except as stated above, all other Review of Systems were negative.  Blood pressure 122/66, pulse 82, temperature 98.8 F (37.1 C), temperature source Oral, resp. rate 19, height 5' 9"  (1.753 m), weight 89.132 kg (196 lb 8 oz), SpO2 99 %.Body mass index is 29 kg/(m^2).  General Appearance: Guarded  Eye Contact:  Fair  Speech:  Clear and Coherent  Volume:  Decreased  Mood:  Anxious and Depressed  Affect:  Constricted and Depressed  Thought Process:  Coherent and Goal Directed  Orientation:  Full (Time, Place, and Person)  Thought Content:  Logical  Suicidal Thoughts:  No  Homicidal Thoughts:  No  Memory:  Immediate;   Fair Recent;   Fair  Judgement:  Fair  Insight:  Fair  Psychomotor Activity:  Psychomotor Retardation  Concentration:  Concentration: Fair  Recall:  Poor  Fund of Knowledge:  Fair  Language:  Good  Akathisia:  Negative  Handed:  Right  AIMS (if indicated):     Assets:  Communication Skills Desire for Improvement Financial Resources/Insurance Housing Leisure Time Resilience Talents/Skills  ADL's:  Impaired  Cognition:  Impaired,  Mild  Sleep:        Treatment Plan Summary: Daily contact with patient to  assess and evaluate symptoms and progress in treatment and Medication management   Patient has no safety concerns, denied active suicidal/homicidal ideation, intention or plans. Patient has intact cognitions including orientation, object identification, aware of some of the medical problems and emotional difficulties and willing to compliant with treatment recommendations.  Patient meets criteria for capacity to make his own medications.   Patient benefit from out-of-home placement as he cannot manage his ADLs and also has sensory loss of vision and hearing on his left side.  We will increase Cymbalta to 40 mg daily for depression and anxiety, patient will be observed on trazodone 50 mg daily at bedtime for insomnia which was recently started by primary care physician.  Appreciate psychiatric consultation and follow up as clinically required Please contact [phone] or [phone] if needs further assistance  Disposition: Patient does not meet criteria for psychiatric inpatient admission. Supportive therapy provided about ongoing stressors.  Ambrose Finland, MD 08/10/2015 11:34 AM

## 2015-08-10 NOTE — Progress Notes (Signed)
Occupational Therapy Treatment Patient Details Name: Jason [name] L Clinkenbeard MRN: [id number] DOB: 1919-12-30 Today's Date: 08/10/2015    History of present illness 82 y.o. admitted from ALF due to acute diastolic heart failure. PMHx: Afib, CHF, HOH, CKD, CAD, PVD, HTN, PTSD, vertigo, CVA   OT comments  Pt agreeable to OT session. Continue to recommend HHOT upon d/c. Feel pt will continue to benefit from acute OT to increase independence prior to d/c.   Follow Up Recommendations  Home health OT;Supervision/Assistance - 24 hour (at ALF-if they can provide adequate assist)    Equipment Recommendations  None recommended by OT    Recommendations for Other Services      Precautions / Restrictions Precautions Precautions: Fall Restrictions Weight Bearing Restrictions: No       Mobility Bed Mobility Overal bed mobility: Needs Assistance Bed Mobility: Supine to Sit;Sit to Supine     Supine to sit: Supervision Sit to supine: Min assist (however +2 assist given to scoot HOB)   General bed mobility comments: assist with LE when returning to bed. +2 assist given to scoot HOB  Transfers Overall transfer level: Needs assistance Equipment used: Rolling walker (2 wheeled) Transfers: Sit to/from Stand Sit to Stand: Min assist            Balance Overall balance assessment: Needs assistance   LOB while standing at sink during functional task-assist given.              ADL Overall ADL's : Needs assistance/impaired Eating/Feeding: Sitting;Independent Eating/Feeding Details (indicate cue type and reason): drank water with no difficulty Grooming: Wash/dry face;Oral care;Applying deodorant;Minimal assistance Grooming Details (indicate cue type and reason): assist for balance Upper Body Bathing: Min guard;Sitting Upper Body Bathing Details (indicate cue type and reason): pt washed armpits Lower Body Bathing: Moderate assistance;Sit to/from stand Lower Body Bathing Details (indicate cue  type and reason): washed bottom Upper Body Dressing : Moderate assistance;Sitting       Toilet Transfer: Minimal assistance;Ambulation;RW (sit to stand from chair and bed)           Functional mobility during ADLs: Rolling walker;Minimal assistance (sit to stand-Min A; Min guard for ambulation) General ADL Comments: Pt performed ADLs at sink level. Sitter in room helped wash pt off UB and LB while OT was standing working with pt.       Vision                     Perception     Praxis      Cognition  Awake/Alert Behavior During Therapy: WFL for tasks assessed/performed Overall Cognitive Status: No family/caregiver present to determine baseline cognitive functioning (history of dementia)                       Extremity/Trunk Assessment               Exercises     Shoulder Instructions       General Comments      Pertinent Vitals/ Pain       Pain Assessment: 0-10 Pain Score: 10-Worst pain ever Pain Location: left hand Pain Descriptors / Indicators: Sore Pain Intervention(s): Other (comment);Monitored during session (had nurse to check hand for shingles)  Home Living  Prior Functioning/Environment              Frequency Min 2X/week     Progress Toward Goals  OT Goals(current goals can now be found in the care plan section)  Progress towards OT goals: Progressing toward goals-added a couple goals  Acute Rehab OT Goals Patient Stated Goal: not stated OT Goal Formulation: With patient Time For Goal Achievement: 08/20/15 ADL Goals Pt Will Perform Grooming: with modified independence;standing Pt Will Perform Lower Body Bathing: with min assist;sit to/from stand (with or without AE) Pt Will Perform Upper Body Dressing: sitting;with set-up;with supervision Pt Will Transfer to Toilet: with modified independence;ambulating;regular height toilet Pt Will Perform Toileting -  Clothing Manipulation and hygiene: with modified independence;sit to/from stand  Plan Discharge plan remains appropriate    Co-evaluation                 End of Session Equipment Utilized During Treatment: Gait belt;Oxygen;Rolling walker   Activity Tolerance Patient tolerated treatment well (but did sit after standing a while at sink )   Patient Left in bed;with bed alarm set;with nursing/sitter in room   Nurse Communication Other (comment) (came to look at pt's left hand)        Time: 4098-1191 OT Time Calculation (min): 21 min  Charges: OT General Charges $OT Visit: 1 Procedure OT Treatments $Self Care/Home Management : 8-22 mins  Earlie Raveling OTR/L [phone] 08/10/2015, 11:05 AM

## 2015-08-10 NOTE — Progress Notes (Signed)
Physical Therapy Treatment Patient Details Name: Jason [name] MRN: [id number] DOB: [birth year] Today's Date: 08/10/2015    History of Present Illness 82 y.o. admitted from ALF due to acute diastolic heart failure. PMHx: Afib, CHF, HOH, CKD, CAD, PVD, HTN, PTSD, vertigo, CVA    PT Comments    Pt with decline in function today requiring increased A with bed mobility and a LOB during gait requiring MOD A.  Pt with sitter present and c/o L hand pain which nursing is looking into.  At this time, recommend SNF and do not feel pt is safe to return to ALF apartment.  Sitter states pt saying he wants to go to [facility], as his wife is there and she is dying and would like to be near her.  Spoke with nursing about pt's performance today.   Follow Up Recommendations  SNF;Supervision for mobility/OOB     Equipment Recommendations  None recommended by PT    Recommendations for Other Services       Precautions / Restrictions Precautions Precautions: Fall Restrictions Weight Bearing Restrictions: No    Mobility  Bed Mobility Overal bed mobility: Needs Assistance Bed Mobility: Supine to Sit;Sit to Supine     Supine to sit: Min assist Sit to supine: Min assist   General bed mobility comments: Pt needed A with trunk for supine >sit and A to scoot forward due to L hand pain.  Sit >supine with MIN A for legs and A to scoot up in bed  Transfers Overall transfer level: Needs assistance Equipment used: Rolling walker (2 wheeled) Transfers: Sit to/from Stand Sit to Stand: Min guard         General transfer comment: Vc for hand placement  Ambulation/Gait Ambulation/Gait assistance: Min guard;Mod assist Ambulation Distance (Feet): 70 Feet Assistive device: Rolling walker (2 wheeled) Gait Pattern/deviations: Decreased step length - right;Decreased step length - left;Shuffle;Trunk flexed     General Gait Details: Amb with o2 on 2 L/min with MIN/guard, but required MOD A with LOB turning  midway in gait.  Also MOD A to get to turn towards bed due to pt wanting to sit too early   Stairs            Wheelchair Mobility    Modified Rankin (Stroke Patients Only)       Balance     Sitting balance-Leahy Scale: Fair       Standing balance-Leahy Scale: Poor                      Cognition Arousal/Alertness: Awake/alert Behavior During Therapy: WFL for tasks assessed/performed Overall Cognitive Status: No family/caregiver present to determine baseline cognitive functioning                      Exercises      General Comments        Pertinent Vitals/Pain Pain Assessment: Faces Faces Pain Scale: Hurts even more Pain Location: neck and L hand Pain Descriptors / Indicators: Grimacing;Moaning Pain Intervention(s): Limited activity within patient's tolerance    Home Living                      Prior Function            PT Goals (current goals can now be found in the care plan section) Acute Rehab PT Goals Patient Stated Goal: not stated PT Goal Formulation: Patient unable to participate in goal setting Time For Goal Achievement:  08/13/15 Potential to Achieve Goals: Good Progress towards PT goals: Not progressing toward goals - comment    Frequency  Min 3X/week    PT Plan Discharge plan needs to be updated    Co-evaluation             End of Session Equipment Utilized During Treatment: Gait belt;Oxygen Activity Tolerance: Patient limited by fatigue Patient left: in bed;with call bell/phone within reach;with nursing/sitter in room     Time: [id number] PT Time Calculation (min) (ACUTE ONLY): 17 min  Charges:  $Gait Training: 8-22 mins                    G Codes:      Caulder Wehner LUBECK 08/10/2015, 10:44 AM

## 2015-08-10 NOTE — NC FL2 (Signed)
[place] MEDICAID FL2 LEVEL OF CARE SCREENING TOOL     IDENTIFICATION  Patient Name: Jason [name] Birthdate: Nov 01, 1919 Sex: male Admission Date (Current Location): 08/05/2015  [facility] and [place] Number:  Producer, television/film/video and Address:  [facility], [address] [place], [place] [address]      Provider Number: [id number]  Attending Physician Name and Address:  Dorothea Ogle, MD  Relative Name and Phone Number:       Current Level of Care: Hospital Recommended Level of Care: Skilled Nursing Facility Prior Approval Number:    Date Approved/Denied:   PASRR Number: [id number]  Discharge Plan: SNF    Current Diagnoses: Patient Active Problem List   Diagnosis Date Noted  . Insomnia 08/05/2015  . Chronic respiratory failure with hypoxia (HCC) 08/05/2015  . Hematuria 07/04/2015  . Hemiparesis and alteration of sensations as late effects of stroke (HCC) 03/04/2015  . Essential hypertension 12/22/2014  . Acute ischemic stroke (HCC) 12/22/2014  . HLD (hyperlipidemia)   . Bilateral edema of lower extremity 11/17/2014  . Diastolic CHF, acute (HCC) 11/17/2014  . Carotid stenosis 11/02/2014  . Orthostatic hypotension 06/29/2014  . Gait disorder 02/04/2014  . Hereditary and idiopathic peripheral neuropathy 02/04/2014  . Breast pain 09/08/2012  . Breast lump 09/08/2012  . OA (osteoarthritis) of knee 09/08/2012  . History of DVT (deep vein thrombosis) 03/02/2012  . PAF (paroxysmal atrial fibrillation) (HCC) 12/09/2011  . Herpes zoster 12/02/2011  . Thrombocytopenia (HCC) 12/02/2011  . CAD (coronary artery disease) 12/02/2011  . CKD (chronic kidney disease), stage III 12/02/2011  . Chronic constipation 10/04/2010  . Irritable bowel syndrome 10/15/2007  . VERTIGO 06/25/2007  . Anxiety disorder 05/27/2007  . PERIPHERAL VASCULAR DISEASE 01/18/2007  . GERD 01/18/2007    Orientation RESPIRATION BLADDER Height & Weight     Self, Place  Normal,  O2 (Sometimes has O2. Nasal Canula 2 L.) Continent, External catheter Weight: 196 lb 8 oz (89.132 kg) Height:   (175.3 cm)  BEHAVIORAL SYMPTOMS/MOOD NEUROLOGICAL BOWEL NUTRITION STATUS  Verbally abusive, Other (Comment) (At times, per daughter. Says this is not like his normal personality.)  (None) Continent Diet (Heart healthy)  AMBULATORY STATUS COMMUNICATION OF NEEDS Skin   Limited Assist Verbally Normal                       Personal Care Assistance Level of Assistance  Bathing, Feeding, Dressing Bathing Assistance: Limited assistance Feeding assistance: Independent Dressing Assistance: Limited assistance     Functional Limitations Info  Sight, Hearing, Speech Sight Info: Adequate Hearing Info: Adequate Speech Info: Adequate    SPECIAL CARE FACTORS FREQUENCY  PT (By licensed PT), OT (By licensed OT), Blood pressure     PT Frequency: 5 x week OT Frequency: 5 x week            Contractures Contractures Info: Not present    Additional Factors Info  Code Status, Allergies, Psychotropic Code Status Info: DNR Allergies Info: Ativan, Augmentin, Belladonna Alkaloids, Clavulanic Acid, Corticosteroids, Fludrocortisone Acetate, Pantoprazole, Parabens, Penicillins, Scopace Psychotropic Info: Anxiety, Insomnia: Cymbalta DR 30 mg PO daily, Trazodone 50 mg PO QHS         Current Medications (08/10/2015):  This is the current hospital active medication list Current Facility-Administered Medications  Medication Dose Route Frequency Provider Last Rate Last Dose  . 0.9 %  sodium chloride infusion  250 mL Intravenous PRN Russella Dar, NP      .  acetaminophen (TYLENOL) tablet 650 mg  650 mg Oral Q4H PRN Russella DarAllison L Ellis, NP   650 mg at 08/07/15 2155  . antiseptic oral rinse (CPC / CETYLPYRIDINIUM CHLORIDE 0.05%) solution 7 mL  7 mL Mouth Rinse BID Jessica U Vann, DO   7 mL at 08/10/15 1000  . aspirin EC tablet 81 mg  81 mg Oral Daily Russella DarAllison L Ellis, NP   81 mg at 08/10/15  0935  . dexlansoprazole (DEXILANT) capsule 60 mg  60 mg Oral Daily Joseph ArtJessica U Vann, DO   60 mg at 08/10/15 [id number]  . doxazosin (CARDURA) tablet 4 mg  4 mg Oral Daily Russella DarAllison L Ellis, NP   4 mg at 08/10/15 0936  . DULoxetine (CYMBALTA) DR capsule 30 mg  30 mg Oral Daily Russella DarAllison L Ellis, NP   30 mg at 08/10/15 0935  . finasteride (PROSCAR) tablet 5 mg  5 mg Oral Daily Russella DarAllison L Ellis, NP   5 mg at 08/10/15 0936  . furosemide (LASIX) injection 40 mg  40 mg Intravenous Daily Dorothea OgleIskra M Myers, MD   40 mg at 08/10/15 0935  . hydrALAZINE (APRESOLINE) tablet 25 mg  25 mg Oral BID Dorothea OgleIskra M Myers, MD   25 mg at 08/10/15 0936  . ipratropium (ATROVENT) 0.06 % nasal spray 2 spray  2 spray Each Nare TID Dorothea OgleIskra M Myers, MD   2 spray at 08/10/15 0946  . latanoprost (XALATAN) 0.005 % ophthalmic solution 1 drop  1 drop Both Eyes QHS Russella DarAllison L Ellis, NP   1 drop at 08/09/15 2121  . mirabegron ER (MYRBETRIQ) tablet 25 mg  25 mg Oral Daily Russella DarAllison L Ellis, NP   25 mg at 08/10/15 [id number]  . nebivolol (BYSTOLIC) tablet 2.5 mg  2.5 mg Oral Daily Russella DarAllison L Ellis, NP   2.5 mg at 08/10/15 0936  . ondansetron (ZOFRAN) injection 4 mg  4 mg Intravenous Q6H PRN Russella DarAllison L Ellis, NP      . oxyCODONE-acetaminophen (PERCOCET/ROXICET) 5-325 MG per tablet 1-2 tablet  1-2 tablet Oral Q4H PRN Dorothea OgleIskra M Myers, MD   2 tablet at 08/10/15 0729  . polyethylene glycol (MIRALAX / GLYCOLAX) packet 17 g  17 g Oral BID Russella DarAllison L Ellis, NP   17 g at 08/10/15 0935  . polyvinyl alcohol (LIQUIFILM TEARS) 1.4 % ophthalmic solution 2 drop  2 drop Both Eyes TID Russella DarAllison L Ellis, NP   2 drop at 08/10/15 0946  . Rivaroxaban (XARELTO) tablet 15 mg  15 mg Oral Q supper Russella DarAllison L Ellis, NP   15 mg at 08/08/15 1700  . senna-docusate (Senokot-S) tablet 1 tablet  1 tablet Oral QHS PRN Russella DarAllison L Ellis, NP   1 tablet at 08/09/15 2120  . sodium chloride flush (NS) 0.9 % injection 3 mL  3 mL Intravenous Q12H Russella DarAllison L Ellis, NP   3 mL at 08/10/15 1000  . sodium chloride flush  (NS) 0.9 % injection 3 mL  3 mL Intravenous PRN Russella DarAllison L Ellis, NP      . traZODone (DESYREL) tablet 50 mg  50 mg Oral QHS Russella DarAllison L Ellis, NP   50 mg at 08/09/15 2120     Discharge Medications: Please see discharge summary for a list of discharge medications.  Relevant Imaging Results:  Relevant Lab Results:   Additional Information SS#: [id number]  Margarito LinerSarah C Allie Ousley, LCSW

## 2015-08-11 ENCOUNTER — Inpatient Hospital Stay (HOSPITAL_COMMUNITY): Payer: Medicare Other

## 2015-08-11 DIAGNOSIS — R52 Pain, unspecified: Secondary | ICD-10-CM

## 2015-08-11 DIAGNOSIS — F331 Major depressive disorder, recurrent, moderate: Secondary | ICD-10-CM

## 2015-08-11 LAB — BASIC METABOLIC PANEL
Anion gap: 9 (ref 5–15)
BUN: 20 mg/dL (ref 6–20)
CALCIUM: 8.6 mg/dL — AB (ref 8.9–10.3)
CHLORIDE: 102 mmol/L (ref 101–111)
CO2: 27 mmol/L (ref 22–32)
CREATININE: 1.9 mg/dL — AB (ref 0.61–1.24)
GFR, EST AFRICAN AMERICAN: 33 mL/min — AB (ref >60)
GFR, EST NON AFRICAN AMERICAN: 29 mL/min — AB (ref >60)
Glucose, Bld: 84 mg/dL (ref 65–99)
Potassium: 3.7 mmol/L (ref 3.5–5.1)
SODIUM: 138 mmol/L (ref 135–145)

## 2015-08-11 LAB — CBC
HCT: 31.5 % — ABNORMAL LOW (ref 39.0–52.0)
HEMOGLOBIN: 9.7 g/dL — AB (ref 13.0–17.0)
MCH: 28.8 pg (ref 26.0–34.0)
MCHC: 30.8 g/dL (ref 30.0–36.0)
MCV: 93.5 fL (ref 78.0–100.0)
PLATELETS: 187 10*3/uL (ref 150–400)
RBC: 3.37 MIL/uL — AB (ref 4.22–5.81)
RDW: 17.4 % — ABNORMAL HIGH (ref 11.5–15.5)
WBC: 4.9 10*3/uL (ref 4.0–10.5)

## 2015-08-11 MED ORDER — FUROSEMIDE 40 MG PO TABS: 40.0000 mg | ORAL_TABLET | Freq: Every day | ORAL | Status: DC

## 2015-08-11 NOTE — Clinical Social Work Note (Addendum)
CSW faxed information needed for [facility]. [place]. [place] [facility] by this afternoon. MD aware.  Charlynn CourtSarah Brix Brearley, CSW [phone]  3:36 pm Patient turned down for [facility] short-term rehab. [facility] MD said he was more appropriate for long-term care. Called Fuller CanadaQuiana Mock, [facility] social worker and left voicemail with this information and asked for her to call back about next steps. Notified patient's daughter. Notified MD.  Charlynn CourtSarah Keaghan Staton, CSW [phone]

## 2015-08-11 NOTE — Care Management Important Message (Signed)
Important Message  Patient Details  Name: Jason [name] MRN: [id number] Date of Birth: [birth year]   Medicare Important Message Given:  Yes    Bernadette HoitShoffner, Dalila Arca Coleman 08/11/2015, 9:19 AM

## 2015-08-11 NOTE — Progress Notes (Signed)
Patient ID: Jason [name], male   DOB: 1920/01/15, 82 y.o.   MRN: [id number]    PROGRESS NOTE    Jason Regalaul L Jacob  [id number] DOB: 1920/01/15 DOA: 08/05/2015  PCP: Leo GrosserPICKARD,[name], MD   Brief Narrative:  82 y.o. male with chronic diastolic heart failure, chronic hypoxemia on daytime oxygen while exerting and prn nocturnal oxygen, paroxysmal atrial fibrillation on Xarelto, hypertension, chronic kidney disease, chronic bilateral extremity edema, history of DVT, history of carotid endarterectomy. He was sent to the hospital from the facility with reports of generalized weakness and shortness of breath   Assessment & Plan:   Diastolic CHF, acute  - Lasix at time of discharge last month had been decreased from twice a day to daily  - weight trend since admission: 200 --> 205 --> 202 --> 197 --> 200 --> 196 --> 195 lbs this AM - No ACE inhibitor secondary to renal insufficiency - Continue Cardura, Apresoline and Bystolic - CXR with no signs of CHF, only minimal crackles at bases  - there is still significant amount of confusion, daughter has agreed with psych eval for competence and depression  - pt has refused IV lasix and I think this is mostly relate to his episodes of confusion  - monitor daily weights, strict I/O    Left hand pain and swelling - c/w cellulitis, no evidence of DVT - continue doxycycline day #2   Anxiety disorder/Insomnia.agiation, depression  - likely component of dementia and ? Depression  - Continue Cymbalta - psych eval requested, pt deemed competent to make his own decision but mod visual impairment interferes with ability to carry out his ADL for which reason he needs placement  - sitter at bedside provided as pt high fall risk    Chronic respiratory failure with hypoxia  - Patient has outpatient orders for continuous nasal cannula oxygen during the day and prn use nocturnally - CT chest with minimal pleural effusions  - has been OK overnight, has not  refused oxygen     Thyroid nodule - 3.5 cm in size,  Review of records indicated this nodule was seen last ear on CT scan and was 3 cm in diameter - outpatient follow up   CKD (chronic kidney disease), stage III, hypokalemia - Cr at baseline - BMP in AM   PAF (paroxysmal atrial fibrillation)  - Rate controlled - Continue Xarelto   Bilateral edema of lower extremity/History of DVT (deep vein thrombosis) - chronic, ECHO requested as last one done in 2016, notable for preserved EF but grade I diastolic CHF  - repeat ECHO stable with preserved EF  - continue Lasix PO    Essential hypertension - Currently controlled on above-stated cardiac medications   GERD - Patient reporting difficulty swallowing foods and a sensation of food getting stuck - SLP evaluation; may benefit from esophagram   HLD (hyperlipidemia) - stop Zocor given advanced age and behavioral issues     Obesity - Body mass index is 30.3 kg/(m^2).   DVT prophylaxis: On Xarelto  Code Status: DNR Family Communication: Patient at bedside, daughter who is POA over the phone updated Disposition Plan: SNF declined pt, now consulted case manager for consideration of long term care facility   Consultants:   None  Procedures:   None  Antimicrobials:   None   Subjective: Left hand pain and swelling improved   Objective: Filed Vitals:   08/11/15 0603 08/11/15 0605 08/11/15 1050 08/11/15 1248  BP:  151/82 142/68 115/60  Pulse:  83  91  Temp:  98.2 F (36.8 C)  98.5 F (36.9 C)  TempSrc:  Oral  Oral  Resp:  18  19  Height:      Weight: 88.86 kg (195 lb 14.4 oz)     SpO2:  93%  93%    Intake/Output Summary (Last 24 hours) at 08/11/15 1616 Last data filed at 08/11/15 1233  Gross per 24 hour  Intake   1080 ml  Output    425 ml  Net    655 ml   Filed Weights   08/09/15 0646 08/10/15 0411 08/11/15 0603  Weight: 90.9 kg (200 lb 6.4 oz) 89.132 kg (196 lb 8 oz) 88.86 kg (195 lb 14.4 oz)     Examination:  General exam: Sleepy, NAD Respiratory system: Crackles at bases rather mild but with diminished breath sounds  Cardiovascular system: IRRR. No JVD, rubs, soft SEM 2/6, no gallops or clicks.Bilateral LE edema +2  Gastrointestinal system: Abdomen is nondistended, soft and nontender. Central nervous system: alert, follows most of the commands, confused at times  EXT: LUE with erythema and edema improved, LE +1 bilateral LE edema, chronic venous stasis changes   Data Reviewed: I have personally reviewed following labs and imaging studies  CBC:  Recent Labs Lab 08/05/15 1205 08/05/15 1235 08/07/15 0416 08/08/15 0414 08/10/15 0318 08/11/15 0547  WBC 6.2  --  6.1 4.7 5.7 4.9  NEUTROABS 4.1  --   --   --   --   --   HGB 10.5* 11.2* 11.0* 10.1* 10.2* 9.7*  HCT 33.5* 33.0* 34.7* 33.3* 33.3* 31.5*  MCV 93.6  --  93.5 94.3 93.0 93.5  PLT 200  --  207 193 198 187   Basic Metabolic Panel:  Recent Labs Lab 08/06/15 0402 08/07/15 0416 08/08/15 0414 08/10/15 0318 08/11/15 0547  NA 140 140 140 137 138  K 3.4* 3.7 3.4* 3.5 3.7  CL 108 106 106 104 102  CO2 25 22 25 24 27   GLUCOSE 77 89 82 82 84  BUN 19 17 17 19 20   CREATININE 1.76* 1.79* 1.74* 1.79* 1.90*  CALCIUM 8.4* 8.9 8.6* 8.5* 8.6*   Urine analysis:    Component Value Date/Time   COLORURINE YELLOW 08/04/2015 0749   APPEARANCEUR CLEAR 08/04/2015 0749   LABSPEC 1.020 08/04/2015 0749   PHURINE 6.0 08/04/2015 0749   GLUCOSEU NEGATIVE 08/04/2015 0749   HGBUR NEGATIVE 08/04/2015 0749   BILIRUBINUR NEGATIVE 08/04/2015 0749   KETONESUR NEGATIVE 08/04/2015 0749   PROTEINUR NEGATIVE 08/04/2015 0749   UROBILINOGEN 0.2 12/21/2014 1940   NITRITE NEGATIVE 08/04/2015 0749   LEUKOCYTESUR NEGATIVE 08/04/2015 0749   Recent Results (from the past 240 hour(s))  MRSA PCR Screening     Status: None   Collection Time: 08/05/15  5:39 PM  Result Value Ref Range Status   MRSA by PCR NEGATIVE NEGATIVE Final    Radiology  Studies: Dg Chest 2 View 08/05/2015 Cardiomegaly. Central mild vascular congestion without convincing pulmonary edema. Small left pleural effusion. Streaky left basilar atelectasis or infiltrate.   Dg Abd Acute W/chest 08/04/2015 Normal bowel gas pattern, no free air. 2. Lower lung volumes, with increased perihilar opacity possibly due to crowding/atelectasis. If there is new cough then consider developing respiratory infection.   Scheduled Meds: . antiseptic oral rinse  7 mL Mouth Rinse BID  . aspirin EC  81 mg Oral Daily  . dexlansoprazole  60 mg Oral Daily  . doxazosin  4 mg Oral Daily  .  doxycycline  100 mg Oral Q12H  . DULoxetine  40 mg Oral Daily  . finasteride  5 mg Oral Daily  . hydrALAZINE  25 mg Oral BID  . ipratropium  2 spray Each Nare TID  . latanoprost  1 drop Both Eyes QHS  . mirabegron ER  25 mg Oral Daily  . nebivolol  2.5 mg Oral Daily  . polyethylene glycol  17 g Oral BID  . polyvinyl alcohol  2 drop Both Eyes TID  . Rivaroxaban  15 mg Oral Q supper  . sodium chloride flush  3 mL Intravenous Q12H  . traZODone  50 mg Oral QHS   Continuous Infusions:  Time spent: 20 minutes    Debbora Presto, MD [facility] Pager [phone]  If 7PM-7AM, please contact night-coverage [email or web address] Password [email or web address] 08/11/2015, 4:16 PM

## 2015-08-11 NOTE — Progress Notes (Signed)
VASCULAR LAB PRELIMINARY  PRELIMINARY  PRELIMINARY  PRELIMINARY  Left upper extremity venous duplex completed.    Left arm-  No evidence of DVT or superficial thrombosis.    IV SITE in forearm of left arm.  Gave results to patients nurse.  Chasty Randal, RVT, RDMS 08/11/2015, 1:47 PM

## 2015-08-12 DIAGNOSIS — R1311 Dysphagia, oral phase: Secondary | ICD-10-CM | POA: Diagnosis not present

## 2015-08-12 DIAGNOSIS — E876 Hypokalemia: Secondary | ICD-10-CM | POA: Diagnosis not present

## 2015-08-12 DIAGNOSIS — I480 Paroxysmal atrial fibrillation: Secondary | ICD-10-CM | POA: Diagnosis not present

## 2015-08-12 DIAGNOSIS — N183 Chronic kidney disease, stage 3 (moderate): Secondary | ICD-10-CM | POA: Diagnosis not present

## 2015-08-12 DIAGNOSIS — M25512 Pain in left shoulder: Secondary | ICD-10-CM | POA: Diagnosis not present

## 2015-08-12 DIAGNOSIS — R600 Localized edema: Secondary | ICD-10-CM | POA: Diagnosis not present

## 2015-08-12 DIAGNOSIS — I5031 Acute diastolic (congestive) heart failure: Secondary | ICD-10-CM | POA: Diagnosis not present

## 2015-08-12 DIAGNOSIS — R5381 Other malaise: Secondary | ICD-10-CM | POA: Diagnosis not present

## 2015-08-12 DIAGNOSIS — G4700 Insomnia, unspecified: Secondary | ICD-10-CM | POA: Diagnosis not present

## 2015-08-12 DIAGNOSIS — M6281 Muscle weakness (generalized): Secondary | ICD-10-CM | POA: Diagnosis not present

## 2015-08-12 DIAGNOSIS — K5909 Other constipation: Secondary | ICD-10-CM | POA: Diagnosis not present

## 2015-08-12 DIAGNOSIS — E785 Hyperlipidemia, unspecified: Secondary | ICD-10-CM | POA: Diagnosis not present

## 2015-08-12 DIAGNOSIS — D6489 Other specified anemias: Secondary | ICD-10-CM | POA: Diagnosis not present

## 2015-08-12 DIAGNOSIS — I509 Heart failure, unspecified: Secondary | ICD-10-CM

## 2015-08-12 DIAGNOSIS — R2689 Other abnormalities of gait and mobility: Secondary | ICD-10-CM | POA: Diagnosis not present

## 2015-08-12 DIAGNOSIS — R1314 Dysphagia, pharyngoesophageal phase: Secondary | ICD-10-CM | POA: Diagnosis not present

## 2015-08-12 DIAGNOSIS — J9691 Respiratory failure, unspecified with hypoxia: Secondary | ICD-10-CM | POA: Diagnosis not present

## 2015-08-12 DIAGNOSIS — F331 Major depressive disorder, recurrent, moderate: Secondary | ICD-10-CM | POA: Diagnosis not present

## 2015-08-12 DIAGNOSIS — I10 Essential (primary) hypertension: Secondary | ICD-10-CM | POA: Diagnosis not present

## 2015-08-12 DIAGNOSIS — R262 Difficulty in walking, not elsewhere classified: Secondary | ICD-10-CM | POA: Diagnosis not present

## 2015-08-12 DIAGNOSIS — M7989 Other specified soft tissue disorders: Secondary | ICD-10-CM | POA: Diagnosis not present

## 2015-08-12 LAB — CBC
HEMATOCRIT: 31.5 % — AB (ref 39.0–52.0)
Hemoglobin: 9.8 g/dL — ABNORMAL LOW (ref 13.0–17.0)
MCH: 28.9 pg (ref 26.0–34.0)
MCHC: 31.1 g/dL (ref 30.0–36.0)
MCV: 92.9 fL (ref 78.0–100.0)
PLATELETS: 190 10*3/uL (ref 150–400)
RBC: 3.39 MIL/uL — AB (ref 4.22–5.81)
RDW: 17.3 % — ABNORMAL HIGH (ref 11.5–15.5)
WBC: 5.6 10*3/uL (ref 4.0–10.5)

## 2015-08-12 LAB — BASIC METABOLIC PANEL
Anion gap: 8 (ref 5–15)
BUN: 23 mg/dL — AB (ref 6–20)
CO2: 26 mmol/L (ref 22–32)
CREATININE: 1.83 mg/dL — AB (ref 0.61–1.24)
Calcium: 8.7 mg/dL — ABNORMAL LOW (ref 8.9–10.3)
Chloride: 104 mmol/L (ref 101–111)
GFR, EST AFRICAN AMERICAN: 35 mL/min — AB (ref >60)
GFR, EST NON AFRICAN AMERICAN: 30 mL/min — AB (ref >60)
Glucose, Bld: 86 mg/dL (ref 65–99)
Potassium: 3.7 mmol/L (ref 3.5–5.1)
SODIUM: 138 mmol/L (ref 135–145)

## 2015-08-12 MED ORDER — DOXYCYCLINE HYCLATE 100 MG PO TABS: 100.0000 mg | ORAL_TABLET | Freq: Two times a day (BID) | ORAL | Status: AC

## 2015-08-12 MED ORDER — HYDRALAZINE HCL 25 MG PO TABS: 25.0000 mg | ORAL_TABLET | Freq: Two times a day (BID) | ORAL | Status: AC

## 2015-08-12 MED ORDER — DULOXETINE HCL 40 MG PO CPEP: 40.0000 mg | ORAL_CAPSULE | Freq: Every day | ORAL | Status: AC

## 2015-08-12 MED ORDER — ALPRAZOLAM 0.25 MG PO TABS: 0.2500 mg | ORAL_TABLET | Freq: Two times a day (BID) | ORAL | Status: AC | PRN

## 2015-08-12 MED ORDER — OXYCODONE-ACETAMINOPHEN 5-325 MG PO TABS: 1.0000 | ORAL_TABLET | ORAL | Status: AC | PRN

## 2015-08-12 NOTE — Discharge Summary (Signed)
Physician Discharge Summary  Jason [name] ZOX:[id number] DOB: 08/01/1919 DOA: 08/05/2015  PCP: Leo GrosserPICKARD,[name], MD  Admit date: 08/05/2015 Discharge date: 08/12/2015  Recommendations for Outpatient Follow-up:  1. Pt will need to follow up with PCP in 1-2 weeks post discharge 2. Please obtain BMP to evaluate electrolytes and kidney function 3. Please also check CBC to evaluate Hg and Hct levels  Discharge Diagnoses:  Principal Problem:   MDD (major depressive disorder), recurrent episode, moderate (HCC) Active Problems:   Diastolic CHF, acute (HCC)   Essential hypertension   HLD (hyperlipidemia)   Insomnia   Chronic respiratory failure with hypoxia (HCC)  Discharge Condition: Stable  Diet recommendation: Heart healthy diet discussed in details   Brief Narrative:  82 y.o. male with chronic diastolic heart failure, chronic hypoxemia on daytime oxygen while exerting and prn nocturnal oxygen, paroxysmal atrial fibrillation on Xarelto, hypertension, chronic kidney disease, chronic bilateral extremity edema, history of DVT, history of carotid endarterectomy. He was sent to the hospital from the facility with reports of generalized weakness and shortness of breath   Assessment & Plan:  Diastolic CHF, acute  - Lasix at time of discharge last month had been decreased from twice a day to daily  - weight trend since admission: 200 --> 205 --> 202 --> 197 --> 200 --> 196 --> 194 lbs this AM - No ACE inhibitor secondary to renal insufficiency - Continue Cardura, Apresoline and Bystolic - CXR with no signs of CHF, only minimal crackles at bases    Left hand pain and swelling - c/w cellulitis, no evidence of DVT - continue doxycycline for 4 days post discharge    Anxiety disorder/Insomnia.agiation, depression  - likely component of dementia and ? Depression  - Continue Cymbalta - psych eval requested, pt deemed competent to make his own decision but mod visual impairment  interferes with ability to carry out his ADL for which reason he needs placement    Chronic respiratory failure with hypoxia  - Patient has outpatient orders for continuous nasal cannula oxygen during the day and prn use nocturnally - CT chest with minimal pleural effusions    Thyroid nodule - 3.5 cm in size, Review of records indicated this nodule was seen last ear on CT scan and was 3 cm in diameter - outpatient follow up   CKD (chronic kidney disease), stage III, hypokalemia - Cr at baseline   PAF (paroxysmal atrial fibrillation)  - Rate controlled - Continue Xarelto   Bilateral edema of lower extremity/History of DVT (deep vein thrombosis) - chronic, ECHO requested as last one done in 2016, notable for preserved EF but grade I diastolic CHF  - repeat ECHO stable with preserved EF  - continue Lasix PO    Essential hypertension - Currently controlled on above-stated cardiac medications   GERD - Patient reporting difficulty swallowing foods and a sensation of food getting stuck - SLP evaluation; may benefit from esophagram   HLD (hyperlipidemia) - stop Zocor given advanced age and behavioral issues    Obesity - Body mass index is 30.3 kg/(m^2).   DVT prophylaxis: On Xarelto  Code Status: DNR Family Communication: Patient at bedside, daughter who is POA over the phone updated Disposition Plan: SNF   Consultants:   None  Procedures:   None  Antimicrobials:   None  Discharge Exam: Filed Vitals:   08/11/15 1956 08/12/15 0504  BP: 124/75 106/64  Pulse: 78 82  Temp: 97.8 F (36.6 C) 97.8 F (36.6 C)  Resp:  18   Filed Vitals:   08/11/15 1248 08/11/15 1956 08/12/15 0504 08/12/15 0826  BP: 115/60 124/75 106/64   Pulse: 91 78 82   Temp: 98.5 F (36.9 C) 97.8 F (36.6 C) 97.8 F (36.6 C)   TempSrc: Oral Oral Oral   Resp: 19  18   Height:      Weight:   88.361 kg (194 lb 12.8 oz)   SpO2: 93% 95% 92% 93%    General: Pt is alert,  follows commands appropriately, not in acute distress Cardiovascular: Regular rate and rhythm, no rubs, no gallops Respiratory: Clear to auscultation bilaterally, no wheezing, mild crackles at bases  Abdominal: Soft, non tender, non distended, bowel sounds +, no guarding Extremities: chronic bilateral LE venous stasis changes   Discharge Instructions  Discharge Instructions    Diet - low sodium heart healthy    Complete by:  As directed      Increase activity slowly    Complete by:  As directed             Medication List    STOP taking these medications        divalproex 250 MG DR tablet  Commonly known as:  DEPAKOTE     GENERLAC 10 GM/15ML Soln  Generic drug:  lactulose (encephalopathy)     HYDROcodone-acetaminophen 5-325 MG tablet  Commonly known as:  NORCO/VICODIN     nitroGLYCERIN 0.4 MG SL tablet  Commonly known as:  NITROSTAT     NON FORMULARY     PRESCRIPTION MEDICATION     ROBITUSSIN COLD COUGH+ CHEST 10-100 MG/5ML liquid  Generic drug:  dextromethorphan-guaiFENesin     simvastatin 40 MG tablet  Commonly known as:  ZOCOR      TAKE these medications        acetaminophen 500 MG tablet  Commonly known as:  TYLENOL  Take 1,000 mg by mouth 2 (two) times daily. Take every day per MAR     ALPRAZolam 0.25 MG tablet  Commonly known as:  XANAX  Take 1 tablet (0.25 mg total) by mouth 2 (two) times daily as needed for anxiety.     aspirin 81 MG EC tablet  Take 1 tablet (81 mg total) by mouth daily.     BIOTENE DRY MOUTH Gel  Place 0.5 inches onto teeth 2 (two) times daily.     DEXILANT 60 MG capsule  Generic drug:  dexlansoprazole  TAKE ONE CAPSULE BY MOUTH EVERY DAY     doxazosin 4 MG tablet  Commonly known as:  CARDURA  Take 1 tablet (4 mg total) by mouth daily.     doxycycline 100 MG tablet  Commonly known as:  VIBRA-TABS  Take 1 tablet (100 mg total) by mouth every 12 (twelve) hours.     DULoxetine HCl 40 MG Cpep  Take 40 mg by mouth daily.      finasteride 5 MG tablet  Commonly known as:  PROSCAR  Take 1 tablet (5 mg total) by mouth daily.     furosemide 40 MG tablet  Commonly known as:  LASIX  TAKE 1 TABLET(40 MG) BY MOUTH DAILY     hydrALAZINE 25 MG tablet  Commonly known as:  APRESOLINE  Take 1 tablet (25 mg total) by mouth 2 (two) times daily.     ipratropium 0.06 % nasal spray  Commonly known as:  ATROVENT  Place 2 sprays into both nostrils 4 (four) times daily.     latanoprost 0.005 % ophthalmic solution  Commonly known as:  XALATAN  Place 1 drop into both eyes at bedtime.     MYRBETRIQ 25 MG Tb24 tablet  Generic drug:  mirabegron ER  TAKE 1 TABLET BY MOUTH EVERY DAY     nebivolol 2.5 MG tablet  Commonly known as:  BYSTOLIC  Take 1 tablet (2.5 mg total) by mouth daily.     ondansetron 4 MG tablet  Commonly known as:  ZOFRAN  Take 4 mg by mouth every 8 (eight) hours as needed for nausea.     oxyCODONE-acetaminophen 5-325 MG tablet  Commonly known as:  PERCOCET/ROXICET  Take 1-2 tablets by mouth every 4 (four) hours as needed for moderate pain or severe pain.     polyethylene glycol packet  Commonly known as:  MIRALAX / GLYCOLAX  Take 17 g by mouth 2 (two) times daily.     polyvinyl alcohol 1.4 % ophthalmic solution  Commonly known as:  LIQUIFILM TEARS  Place 2 drops into both eyes 3 (three) times daily.     senna-docusate 8.6-50 MG tablet  Commonly known as:  Senokot-S  Take 1 tablet by mouth at bedtime as needed for mild constipation.     traZODone 50 MG tablet  Commonly known as:  DESYREL  TAKE 1 TABLET(50 MG) BY MOUTH AT BEDTIME     XARELTO 15 MG Tabs tablet  Generic drug:  Rivaroxaban  TAKE 1 TABLET BY MOUTH EVERY DAY WITH SUPPER           Follow-up Information    Follow up with [facility].   Specialty:  Skilled Nursing Facility   Contact information:   [address] [place] [address] [phone]      Follow up with Leo Grosser, MD.   Specialty:  Family Medicine   Contact information:   [address] [place] [address] [phone]       Call Debbora Presto, MD.   Specialty:  Internal Medicine   Why:  As needed cakk my cell phone [phone]   Contact information:   [address] [place] [address] [phone]      The results of significant diagnostics from this hospitalization (including imaging, microbiology, ancillary and laboratory) are listed below for reference.     Microbiology: Recent Results (from the past 240 hour(s))  MRSA PCR Screening     Status: None   Collection Time: 08/05/15  5:39 PM  Result Value Ref Range Status   MRSA by PCR NEGATIVE NEGATIVE Final     Labs: Basic Metabolic Panel:  Recent Labs Lab 08/07/15 0416 08/08/15 0414 08/10/15 0318 08/11/15 0547 08/12/15 0350  NA 140 140 137 138 138  K 3.7 3.4* 3.5 3.7 3.7  CL 106 106 104 102 104  CO2 GLUCOSE 89 82 82 84 86  BUN 23*  CREATININE 1.79* 1.74* 1.79* 1.90* 1.83*  CALCIUM 8.9 8.6* 8.5* 8.6* 8.7*   CBC:  Recent Labs Lab 08/05/15 1205  08/07/15 0416 08/08/15 0414 08/10/15 0318 08/11/15 0547 08/12/15 0350  WBC 6.2  --  6.1 4.7 5.7 4.9 5.6  NEUTROABS 4.1  --   --   --   --   --   --   HGB 10.5*  < > 11.0* 10.1* 10.2* 9.7* 9.8*  HCT 33.5*  < > 34.7* 33.3* 33.3* 31.5* 31.5*  MCV 93.6  --  93.5 94.3 93.0 93.5 92.9  PLT 200  --  207 193 198 187 190  < > = values in this interval not displayed.  BNP (last 3 results)  Recent Labs  07/04/15 1125 08/04/15 0759 08/05/15 1205  BNP 353.1* 605.1* 651.3*    SIGNED: Time coordinating discharge 30 minutes  MAGICK-Raymundo Rout, MD  [facility] 08/12/2015, 10:32 AM Pager [phone]  If 7PM-7AM, please contact night-coverage [email or web address] Password [email or web address]

## 2015-08-12 NOTE — Clinical Social Work Placement (Signed)
   CLINICAL SOCIAL WORK PLACEMENT  NOTE  Date:  08/12/2015  Patient Details  Name: Jason [name] MRN: [id number] Date of Birth: 10-10-1919  Clinical Social Work is seeking post-discharge placement for this patient at the Skilled  Nursing Facility level of care (*CSW will initial, date and re-position this form in  chart as items are completed):  Yes   Patient/family provided with [facility] Clinical Social Work Department's list of facilities offering this level of care within the geographic area requested by the patient (or if unable, by the patient's family).  Yes   Patient/family informed of their freedom to choose among providers that offer the needed level of care, that participate in Medicare, Medicaid or managed care program needed by the patient, have an available bed and are willing to accept the patient.  Yes   Patient/family informed of [facility]'s ownership interest in [facility] and [facility], as well as of the fact that they are under no obligation to receive care at these facilities.  PASRR submitted to EDS on       PASRR number received on       Existing PASRR number confirmed on 08/10/15     FL2 transmitted to all facilities in geographic area requested by pt/family on 08/10/15     FL2 transmitted to all facilities within larger geographic area on       Patient informed that his/her managed care company has contracts with or will negotiate with certain facilities, including the following:        Yes   Patient/family informed of bed offers received.  Patient chooses bed at [facility] [place]     Physician recommends and patient chooses bed at      Patient to be transferred to [facility] [place] on 08/12/15.  Patient to be transferred to facility by [facility]     Patient family notified on 08/12/15 of transfer.  Name of family member notified:  [name]     PHYSICIAN Please prepare prescriptions, Please sign FL2, Please sign DNR      Additional Comment:    _______________________________________________ Margarito LinerSarah C Vernica Wachtel, LCSW 08/12/2015, 3:23 PM

## 2015-08-12 NOTE — Clinical Social Work Maternal (Signed)
Opened in error.  Jason [name],[address]arly[address] Court CSW [phone]

## 2015-08-12 NOTE — Consult Note (Signed)
   [facility]   08/12/2015  Earnstine Regalaul L Menchaca [birth year] [id number]  Update note: Patient is being discharged to [facility] at discharge for possible long-term care. Patient has no community care management needs at this time. For questions, please contact:  Charlesetta ShanksVictoria Tylon Kemmerling, RN BSN CCM [facility] Lia[facility]  [phone] business mobile phone Toll free office [phone]

## 2015-08-12 NOTE — Clinical Social Work Note (Signed)
CSW facilitated patient discharge including contacting patient family and facility to confirm patient discharge plans. Clinical information faxed to facility and family agreeable with plan. CSW arranged ambulance transport via [facility] to [facility] in [place]. RN to call report prior to discharge [phone] Rm [phone]).  CSW will sign off for now as social work intervention is no longer needed. Please consult us again if new needs arise.  Charlynn CourtSarah Romain Erion, CSW [phone]

## 2015-08-15 DIAGNOSIS — I480 Paroxysmal atrial fibrillation: Secondary | ICD-10-CM | POA: Diagnosis not present

## 2015-08-15 DIAGNOSIS — R5381 Other malaise: Secondary | ICD-10-CM | POA: Diagnosis not present

## 2015-08-15 DIAGNOSIS — K5909 Other constipation: Secondary | ICD-10-CM | POA: Diagnosis not present

## 2015-08-15 DIAGNOSIS — D6489 Other specified anemias: Secondary | ICD-10-CM | POA: Diagnosis not present

## 2015-08-15 DIAGNOSIS — R2689 Other abnormalities of gait and mobility: Secondary | ICD-10-CM | POA: Diagnosis not present

## 2015-08-19 NOTE — Progress Notes (Signed)
PT evaluation addendum Late entry for 07/05/15 G-codes    07/05/15 1329  PT Time Calculation  PT Start Time (ACUTE ONLY) 1256  PT Stop Time (ACUTE ONLY) 1323  PT Time Calculation (min) (ACUTE ONLY) 27 min  PT G-Codes **NOT FOR INPATIENT CLASS**  Functional Assessment Tool Used clinical judgement/chart review  Functional Limitation Mobility: Walking and moving around  Mobility: Walking and Moving Around Current Status [phone](G8978) CI  Mobility: Walking and Moving Around Goal Status (U0454(G8979) CI  PT General Charges  $$ ACUTE PT VISIT 1 Procedure  PT Evaluation  $PT Eval Moderate Complexity 1 Procedure  PT Treatments  $Gait Training 8-22 mins   08/19/2015 Corlis HoveMargie Bralon Antkowiak, PT [phone]

## 2015-09-05 DIAGNOSIS — I480 Paroxysmal atrial fibrillation: Secondary | ICD-10-CM | POA: Diagnosis not present

## 2015-09-05 DIAGNOSIS — M542 Cervicalgia: Secondary | ICD-10-CM | POA: Diagnosis not present

## 2015-09-05 DIAGNOSIS — K5909 Other constipation: Secondary | ICD-10-CM | POA: Diagnosis not present

## 2015-09-07 ENCOUNTER — Ambulatory Visit: Payer: Medicare Other | Admitting: Neurology

## 2015-09-11 DIAGNOSIS — R0602 Shortness of breath: Secondary | ICD-10-CM | POA: Diagnosis not present

## 2015-09-12 DIAGNOSIS — N390 Urinary tract infection, site not specified: Secondary | ICD-10-CM | POA: Diagnosis not present

## 2015-09-12 DIAGNOSIS — Z79899 Other long term (current) drug therapy: Secondary | ICD-10-CM | POA: Diagnosis not present

## 2015-09-12 DIAGNOSIS — J811 Chronic pulmonary edema: Secondary | ICD-10-CM | POA: Diagnosis not present

## 2015-09-14 DIAGNOSIS — I639 Cerebral infarction, unspecified: Secondary | ICD-10-CM | POA: Diagnosis not present

## 2015-09-14 DIAGNOSIS — K219 Gastro-esophageal reflux disease without esophagitis: Secondary | ICD-10-CM | POA: Diagnosis not present

## 2015-09-14 DIAGNOSIS — R1312 Dysphagia, oropharyngeal phase: Secondary | ICD-10-CM | POA: Diagnosis not present

## 2015-09-14 DIAGNOSIS — R296 Repeated falls: Secondary | ICD-10-CM | POA: Diagnosis not present

## 2015-09-14 DIAGNOSIS — R1311 Dysphagia, oral phase: Secondary | ICD-10-CM | POA: Diagnosis not present

## 2015-09-14 DIAGNOSIS — R278 Other lack of coordination: Secondary | ICD-10-CM | POA: Diagnosis not present

## 2015-09-14 DIAGNOSIS — I2510 Atherosclerotic heart disease of native coronary artery without angina pectoris: Secondary | ICD-10-CM | POA: Diagnosis not present

## 2015-09-14 DIAGNOSIS — R262 Difficulty in walking, not elsewhere classified: Secondary | ICD-10-CM | POA: Diagnosis not present

## 2015-09-15 DIAGNOSIS — R278 Other lack of coordination: Secondary | ICD-10-CM | POA: Diagnosis not present

## 2015-09-15 DIAGNOSIS — R296 Repeated falls: Secondary | ICD-10-CM | POA: Diagnosis not present

## 2015-09-15 DIAGNOSIS — R1311 Dysphagia, oral phase: Secondary | ICD-10-CM | POA: Diagnosis not present

## 2015-09-15 DIAGNOSIS — R1312 Dysphagia, oropharyngeal phase: Secondary | ICD-10-CM | POA: Diagnosis not present

## 2015-09-15 DIAGNOSIS — I639 Cerebral infarction, unspecified: Secondary | ICD-10-CM | POA: Diagnosis not present

## 2015-09-15 DIAGNOSIS — R262 Difficulty in walking, not elsewhere classified: Secondary | ICD-10-CM | POA: Diagnosis not present

## 2015-09-15 DIAGNOSIS — I2510 Atherosclerotic heart disease of native coronary artery without angina pectoris: Secondary | ICD-10-CM | POA: Diagnosis not present

## 2015-09-15 DIAGNOSIS — K219 Gastro-esophageal reflux disease without esophagitis: Secondary | ICD-10-CM | POA: Diagnosis not present

## 2015-09-16 DIAGNOSIS — R0602 Shortness of breath: Secondary | ICD-10-CM | POA: Diagnosis not present

## 2015-09-16 DIAGNOSIS — R296 Repeated falls: Secondary | ICD-10-CM | POA: Diagnosis not present

## 2015-09-16 DIAGNOSIS — I639 Cerebral infarction, unspecified: Secondary | ICD-10-CM | POA: Diagnosis not present

## 2015-09-16 DIAGNOSIS — R262 Difficulty in walking, not elsewhere classified: Secondary | ICD-10-CM | POA: Diagnosis not present

## 2015-09-16 DIAGNOSIS — R1312 Dysphagia, oropharyngeal phase: Secondary | ICD-10-CM | POA: Diagnosis not present

## 2015-09-16 DIAGNOSIS — R1311 Dysphagia, oral phase: Secondary | ICD-10-CM | POA: Diagnosis not present

## 2015-09-16 DIAGNOSIS — R278 Other lack of coordination: Secondary | ICD-10-CM | POA: Diagnosis not present

## 2015-09-16 DIAGNOSIS — K219 Gastro-esophageal reflux disease without esophagitis: Secondary | ICD-10-CM | POA: Diagnosis not present

## 2015-09-16 DIAGNOSIS — I2510 Atherosclerotic heart disease of native coronary artery without angina pectoris: Secondary | ICD-10-CM | POA: Diagnosis not present

## 2015-09-19 DIAGNOSIS — I10 Essential (primary) hypertension: Secondary | ICD-10-CM | POA: Diagnosis not present

## 2015-09-19 DIAGNOSIS — R0609 Other forms of dyspnea: Secondary | ICD-10-CM | POA: Diagnosis not present

## 2015-09-20 DIAGNOSIS — R1312 Dysphagia, oropharyngeal phase: Secondary | ICD-10-CM | POA: Diagnosis not present

## 2015-09-20 DIAGNOSIS — I2510 Atherosclerotic heart disease of native coronary artery without angina pectoris: Secondary | ICD-10-CM | POA: Diagnosis not present

## 2015-09-20 DIAGNOSIS — R278 Other lack of coordination: Secondary | ICD-10-CM | POA: Diagnosis not present

## 2015-09-20 DIAGNOSIS — R296 Repeated falls: Secondary | ICD-10-CM | POA: Diagnosis not present

## 2015-09-20 DIAGNOSIS — R1311 Dysphagia, oral phase: Secondary | ICD-10-CM | POA: Diagnosis not present

## 2015-09-20 DIAGNOSIS — R262 Difficulty in walking, not elsewhere classified: Secondary | ICD-10-CM | POA: Diagnosis not present

## 2015-09-20 DIAGNOSIS — K219 Gastro-esophageal reflux disease without esophagitis: Secondary | ICD-10-CM | POA: Diagnosis not present

## 2015-09-20 DIAGNOSIS — I639 Cerebral infarction, unspecified: Secondary | ICD-10-CM | POA: Diagnosis not present

## 2015-09-21 DIAGNOSIS — I639 Cerebral infarction, unspecified: Secondary | ICD-10-CM | POA: Diagnosis not present

## 2015-09-21 DIAGNOSIS — R1311 Dysphagia, oral phase: Secondary | ICD-10-CM | POA: Diagnosis not present

## 2015-09-21 DIAGNOSIS — K219 Gastro-esophageal reflux disease without esophagitis: Secondary | ICD-10-CM | POA: Diagnosis not present

## 2015-09-21 DIAGNOSIS — R296 Repeated falls: Secondary | ICD-10-CM | POA: Diagnosis not present

## 2015-09-21 DIAGNOSIS — R262 Difficulty in walking, not elsewhere classified: Secondary | ICD-10-CM | POA: Diagnosis not present

## 2015-09-21 DIAGNOSIS — I83028 Varicose veins of left lower extremity with ulcer other part of lower leg: Secondary | ICD-10-CM | POA: Diagnosis not present

## 2015-09-21 DIAGNOSIS — R278 Other lack of coordination: Secondary | ICD-10-CM | POA: Diagnosis not present

## 2015-09-21 DIAGNOSIS — I2510 Atherosclerotic heart disease of native coronary artery without angina pectoris: Secondary | ICD-10-CM | POA: Diagnosis not present

## 2015-09-21 DIAGNOSIS — R1312 Dysphagia, oropharyngeal phase: Secondary | ICD-10-CM | POA: Diagnosis not present

## 2015-09-22 DIAGNOSIS — R1311 Dysphagia, oral phase: Secondary | ICD-10-CM | POA: Diagnosis not present

## 2015-09-22 DIAGNOSIS — R1312 Dysphagia, oropharyngeal phase: Secondary | ICD-10-CM | POA: Diagnosis not present

## 2015-09-22 DIAGNOSIS — R262 Difficulty in walking, not elsewhere classified: Secondary | ICD-10-CM | POA: Diagnosis not present

## 2015-09-22 DIAGNOSIS — R278 Other lack of coordination: Secondary | ICD-10-CM | POA: Diagnosis not present

## 2015-09-22 DIAGNOSIS — I2510 Atherosclerotic heart disease of native coronary artery without angina pectoris: Secondary | ICD-10-CM | POA: Diagnosis not present

## 2015-09-22 DIAGNOSIS — R296 Repeated falls: Secondary | ICD-10-CM | POA: Diagnosis not present

## 2015-09-22 DIAGNOSIS — K219 Gastro-esophageal reflux disease without esophagitis: Secondary | ICD-10-CM | POA: Diagnosis not present

## 2015-09-22 DIAGNOSIS — I639 Cerebral infarction, unspecified: Secondary | ICD-10-CM | POA: Diagnosis not present

## 2015-09-23 DIAGNOSIS — I639 Cerebral infarction, unspecified: Secondary | ICD-10-CM | POA: Diagnosis not present

## 2015-09-23 DIAGNOSIS — R278 Other lack of coordination: Secondary | ICD-10-CM | POA: Diagnosis not present

## 2015-09-23 DIAGNOSIS — I2510 Atherosclerotic heart disease of native coronary artery without angina pectoris: Secondary | ICD-10-CM | POA: Diagnosis not present

## 2015-09-23 DIAGNOSIS — R262 Difficulty in walking, not elsewhere classified: Secondary | ICD-10-CM | POA: Diagnosis not present

## 2015-09-23 DIAGNOSIS — R296 Repeated falls: Secondary | ICD-10-CM | POA: Diagnosis not present

## 2015-09-23 DIAGNOSIS — K219 Gastro-esophageal reflux disease without esophagitis: Secondary | ICD-10-CM | POA: Diagnosis not present

## 2015-09-23 DIAGNOSIS — R1312 Dysphagia, oropharyngeal phase: Secondary | ICD-10-CM | POA: Diagnosis not present

## 2015-09-23 DIAGNOSIS — R1311 Dysphagia, oral phase: Secondary | ICD-10-CM | POA: Diagnosis not present

## 2015-09-24 DIAGNOSIS — I639 Cerebral infarction, unspecified: Secondary | ICD-10-CM | POA: Diagnosis not present

## 2015-09-24 DIAGNOSIS — I2510 Atherosclerotic heart disease of native coronary artery without angina pectoris: Secondary | ICD-10-CM | POA: Diagnosis not present

## 2015-09-24 DIAGNOSIS — R0602 Shortness of breath: Secondary | ICD-10-CM | POA: Diagnosis not present

## 2015-09-24 DIAGNOSIS — R296 Repeated falls: Secondary | ICD-10-CM | POA: Diagnosis not present

## 2015-09-24 DIAGNOSIS — R278 Other lack of coordination: Secondary | ICD-10-CM | POA: Diagnosis not present

## 2015-09-24 DIAGNOSIS — R1312 Dysphagia, oropharyngeal phase: Secondary | ICD-10-CM | POA: Diagnosis not present

## 2015-09-24 DIAGNOSIS — K219 Gastro-esophageal reflux disease without esophagitis: Secondary | ICD-10-CM | POA: Diagnosis not present

## 2015-09-24 DIAGNOSIS — R1311 Dysphagia, oral phase: Secondary | ICD-10-CM | POA: Diagnosis not present

## 2015-09-24 DIAGNOSIS — Z79899 Other long term (current) drug therapy: Secondary | ICD-10-CM | POA: Diagnosis not present

## 2015-09-24 DIAGNOSIS — R262 Difficulty in walking, not elsewhere classified: Secondary | ICD-10-CM | POA: Diagnosis not present

## 2015-09-27 DIAGNOSIS — R1311 Dysphagia, oral phase: Secondary | ICD-10-CM | POA: Diagnosis not present

## 2015-09-27 DIAGNOSIS — R0602 Shortness of breath: Secondary | ICD-10-CM | POA: Diagnosis not present

## 2015-09-27 DIAGNOSIS — K219 Gastro-esophageal reflux disease without esophagitis: Secondary | ICD-10-CM | POA: Diagnosis not present

## 2015-09-27 DIAGNOSIS — I639 Cerebral infarction, unspecified: Secondary | ICD-10-CM | POA: Diagnosis not present

## 2015-09-27 DIAGNOSIS — R1312 Dysphagia, oropharyngeal phase: Secondary | ICD-10-CM | POA: Diagnosis not present

## 2015-09-27 DIAGNOSIS — Z79899 Other long term (current) drug therapy: Secondary | ICD-10-CM | POA: Diagnosis not present

## 2015-09-27 DIAGNOSIS — R278 Other lack of coordination: Secondary | ICD-10-CM | POA: Diagnosis not present

## 2015-09-27 DIAGNOSIS — I2510 Atherosclerotic heart disease of native coronary artery without angina pectoris: Secondary | ICD-10-CM | POA: Diagnosis not present

## 2015-09-27 DIAGNOSIS — R296 Repeated falls: Secondary | ICD-10-CM | POA: Diagnosis not present

## 2015-09-27 DIAGNOSIS — R262 Difficulty in walking, not elsewhere classified: Secondary | ICD-10-CM | POA: Diagnosis not present

## 2015-09-28 DIAGNOSIS — R1311 Dysphagia, oral phase: Secondary | ICD-10-CM | POA: Diagnosis not present

## 2015-09-28 DIAGNOSIS — R262 Difficulty in walking, not elsewhere classified: Secondary | ICD-10-CM | POA: Diagnosis not present

## 2015-09-28 DIAGNOSIS — I83028 Varicose veins of left lower extremity with ulcer other part of lower leg: Secondary | ICD-10-CM | POA: Diagnosis not present

## 2015-09-28 DIAGNOSIS — I2510 Atherosclerotic heart disease of native coronary artery without angina pectoris: Secondary | ICD-10-CM | POA: Diagnosis not present

## 2015-09-28 DIAGNOSIS — R278 Other lack of coordination: Secondary | ICD-10-CM | POA: Diagnosis not present

## 2015-09-28 DIAGNOSIS — K219 Gastro-esophageal reflux disease without esophagitis: Secondary | ICD-10-CM | POA: Diagnosis not present

## 2015-09-28 DIAGNOSIS — R296 Repeated falls: Secondary | ICD-10-CM | POA: Diagnosis not present

## 2015-09-28 DIAGNOSIS — R1312 Dysphagia, oropharyngeal phase: Secondary | ICD-10-CM | POA: Diagnosis not present

## 2015-09-28 DIAGNOSIS — I639 Cerebral infarction, unspecified: Secondary | ICD-10-CM | POA: Diagnosis not present

## 2015-09-29 DIAGNOSIS — R1311 Dysphagia, oral phase: Secondary | ICD-10-CM | POA: Diagnosis not present

## 2015-09-29 DIAGNOSIS — R278 Other lack of coordination: Secondary | ICD-10-CM | POA: Diagnosis not present

## 2015-09-29 DIAGNOSIS — R262 Difficulty in walking, not elsewhere classified: Secondary | ICD-10-CM | POA: Diagnosis not present

## 2015-09-29 DIAGNOSIS — R1312 Dysphagia, oropharyngeal phase: Secondary | ICD-10-CM | POA: Diagnosis not present

## 2015-09-29 DIAGNOSIS — I639 Cerebral infarction, unspecified: Secondary | ICD-10-CM | POA: Diagnosis not present

## 2015-09-29 DIAGNOSIS — I2510 Atherosclerotic heart disease of native coronary artery without angina pectoris: Secondary | ICD-10-CM | POA: Diagnosis not present

## 2015-09-29 DIAGNOSIS — K219 Gastro-esophageal reflux disease without esophagitis: Secondary | ICD-10-CM | POA: Diagnosis not present

## 2015-09-29 DIAGNOSIS — R296 Repeated falls: Secondary | ICD-10-CM | POA: Diagnosis not present

## 2015-09-30 DIAGNOSIS — R296 Repeated falls: Secondary | ICD-10-CM | POA: Diagnosis not present

## 2015-09-30 DIAGNOSIS — I2510 Atherosclerotic heart disease of native coronary artery without angina pectoris: Secondary | ICD-10-CM | POA: Diagnosis not present

## 2015-09-30 DIAGNOSIS — R1311 Dysphagia, oral phase: Secondary | ICD-10-CM | POA: Diagnosis not present

## 2015-09-30 DIAGNOSIS — R262 Difficulty in walking, not elsewhere classified: Secondary | ICD-10-CM | POA: Diagnosis not present

## 2015-09-30 DIAGNOSIS — I639 Cerebral infarction, unspecified: Secondary | ICD-10-CM | POA: Diagnosis not present

## 2015-09-30 DIAGNOSIS — K219 Gastro-esophageal reflux disease without esophagitis: Secondary | ICD-10-CM | POA: Diagnosis not present

## 2015-09-30 DIAGNOSIS — R278 Other lack of coordination: Secondary | ICD-10-CM | POA: Diagnosis not present

## 2015-09-30 DIAGNOSIS — R1312 Dysphagia, oropharyngeal phase: Secondary | ICD-10-CM | POA: Diagnosis not present

## 2015-10-01 DIAGNOSIS — R262 Difficulty in walking, not elsewhere classified: Secondary | ICD-10-CM | POA: Diagnosis not present

## 2015-10-01 DIAGNOSIS — I2510 Atherosclerotic heart disease of native coronary artery without angina pectoris: Secondary | ICD-10-CM | POA: Diagnosis not present

## 2015-10-01 DIAGNOSIS — R1311 Dysphagia, oral phase: Secondary | ICD-10-CM | POA: Diagnosis not present

## 2015-10-01 DIAGNOSIS — K219 Gastro-esophageal reflux disease without esophagitis: Secondary | ICD-10-CM | POA: Diagnosis not present

## 2015-10-01 DIAGNOSIS — R296 Repeated falls: Secondary | ICD-10-CM | POA: Diagnosis not present

## 2015-10-01 DIAGNOSIS — R1312 Dysphagia, oropharyngeal phase: Secondary | ICD-10-CM | POA: Diagnosis not present

## 2015-10-01 DIAGNOSIS — I639 Cerebral infarction, unspecified: Secondary | ICD-10-CM | POA: Diagnosis not present

## 2015-10-01 DIAGNOSIS — R278 Other lack of coordination: Secondary | ICD-10-CM | POA: Diagnosis not present

## 2015-10-04 DIAGNOSIS — I2510 Atherosclerotic heart disease of native coronary artery without angina pectoris: Secondary | ICD-10-CM | POA: Diagnosis not present

## 2015-10-04 DIAGNOSIS — R262 Difficulty in walking, not elsewhere classified: Secondary | ICD-10-CM | POA: Diagnosis not present

## 2015-10-04 DIAGNOSIS — R1311 Dysphagia, oral phase: Secondary | ICD-10-CM | POA: Diagnosis not present

## 2015-10-04 DIAGNOSIS — I639 Cerebral infarction, unspecified: Secondary | ICD-10-CM | POA: Diagnosis not present

## 2015-10-04 DIAGNOSIS — R278 Other lack of coordination: Secondary | ICD-10-CM | POA: Diagnosis not present

## 2015-10-04 DIAGNOSIS — R296 Repeated falls: Secondary | ICD-10-CM | POA: Diagnosis not present

## 2015-10-04 DIAGNOSIS — R1312 Dysphagia, oropharyngeal phase: Secondary | ICD-10-CM | POA: Diagnosis not present

## 2015-10-04 DIAGNOSIS — K219 Gastro-esophageal reflux disease without esophagitis: Secondary | ICD-10-CM | POA: Diagnosis not present

## 2015-10-05 DIAGNOSIS — I2510 Atherosclerotic heart disease of native coronary artery without angina pectoris: Secondary | ICD-10-CM | POA: Diagnosis not present

## 2015-10-05 DIAGNOSIS — R1311 Dysphagia, oral phase: Secondary | ICD-10-CM | POA: Diagnosis not present

## 2015-10-05 DIAGNOSIS — I639 Cerebral infarction, unspecified: Secondary | ICD-10-CM | POA: Diagnosis not present

## 2015-10-05 DIAGNOSIS — R1312 Dysphagia, oropharyngeal phase: Secondary | ICD-10-CM | POA: Diagnosis not present

## 2015-10-05 DIAGNOSIS — R278 Other lack of coordination: Secondary | ICD-10-CM | POA: Diagnosis not present

## 2015-10-05 DIAGNOSIS — R296 Repeated falls: Secondary | ICD-10-CM | POA: Diagnosis not present

## 2015-10-05 DIAGNOSIS — R262 Difficulty in walking, not elsewhere classified: Secondary | ICD-10-CM | POA: Diagnosis not present

## 2015-10-05 DIAGNOSIS — K219 Gastro-esophageal reflux disease without esophagitis: Secondary | ICD-10-CM | POA: Diagnosis not present

## 2015-10-06 DIAGNOSIS — M79671 Pain in right foot: Secondary | ICD-10-CM | POA: Diagnosis not present

## 2015-10-06 DIAGNOSIS — I2510 Atherosclerotic heart disease of native coronary artery without angina pectoris: Secondary | ICD-10-CM | POA: Diagnosis not present

## 2015-10-06 DIAGNOSIS — B351 Tinea unguium: Secondary | ICD-10-CM | POA: Diagnosis not present

## 2015-10-06 DIAGNOSIS — M79672 Pain in left foot: Secondary | ICD-10-CM | POA: Diagnosis not present

## 2015-10-06 DIAGNOSIS — I83028 Varicose veins of left lower extremity with ulcer other part of lower leg: Secondary | ICD-10-CM | POA: Diagnosis not present

## 2015-10-06 DIAGNOSIS — R296 Repeated falls: Secondary | ICD-10-CM | POA: Diagnosis not present

## 2015-10-06 DIAGNOSIS — R278 Other lack of coordination: Secondary | ICD-10-CM | POA: Diagnosis not present

## 2015-10-06 DIAGNOSIS — R262 Difficulty in walking, not elsewhere classified: Secondary | ICD-10-CM | POA: Diagnosis not present

## 2015-10-06 DIAGNOSIS — R1312 Dysphagia, oropharyngeal phase: Secondary | ICD-10-CM | POA: Diagnosis not present

## 2015-10-06 DIAGNOSIS — I639 Cerebral infarction, unspecified: Secondary | ICD-10-CM | POA: Diagnosis not present

## 2015-10-06 DIAGNOSIS — K219 Gastro-esophageal reflux disease without esophagitis: Secondary | ICD-10-CM | POA: Diagnosis not present

## 2015-10-06 DIAGNOSIS — R1311 Dysphagia, oral phase: Secondary | ICD-10-CM | POA: Diagnosis not present

## 2015-10-07 DIAGNOSIS — I639 Cerebral infarction, unspecified: Secondary | ICD-10-CM | POA: Diagnosis not present

## 2015-10-07 DIAGNOSIS — K219 Gastro-esophageal reflux disease without esophagitis: Secondary | ICD-10-CM | POA: Diagnosis not present

## 2015-10-07 DIAGNOSIS — R296 Repeated falls: Secondary | ICD-10-CM | POA: Diagnosis not present

## 2015-10-07 DIAGNOSIS — R278 Other lack of coordination: Secondary | ICD-10-CM | POA: Diagnosis not present

## 2015-10-07 DIAGNOSIS — R262 Difficulty in walking, not elsewhere classified: Secondary | ICD-10-CM | POA: Diagnosis not present

## 2015-10-07 DIAGNOSIS — R1312 Dysphagia, oropharyngeal phase: Secondary | ICD-10-CM | POA: Diagnosis not present

## 2015-10-07 DIAGNOSIS — R1311 Dysphagia, oral phase: Secondary | ICD-10-CM | POA: Diagnosis not present

## 2015-10-07 DIAGNOSIS — I2510 Atherosclerotic heart disease of native coronary artery without angina pectoris: Secondary | ICD-10-CM | POA: Diagnosis not present

## 2015-10-11 DIAGNOSIS — R1312 Dysphagia, oropharyngeal phase: Secondary | ICD-10-CM | POA: Diagnosis not present

## 2015-10-11 DIAGNOSIS — I2510 Atherosclerotic heart disease of native coronary artery without angina pectoris: Secondary | ICD-10-CM | POA: Diagnosis not present

## 2015-10-11 DIAGNOSIS — K219 Gastro-esophageal reflux disease without esophagitis: Secondary | ICD-10-CM | POA: Diagnosis not present

## 2015-10-11 DIAGNOSIS — R296 Repeated falls: Secondary | ICD-10-CM | POA: Diagnosis not present

## 2015-10-11 DIAGNOSIS — I639 Cerebral infarction, unspecified: Secondary | ICD-10-CM | POA: Diagnosis not present

## 2015-10-11 DIAGNOSIS — R1311 Dysphagia, oral phase: Secondary | ICD-10-CM | POA: Diagnosis not present

## 2015-10-11 DIAGNOSIS — R262 Difficulty in walking, not elsewhere classified: Secondary | ICD-10-CM | POA: Diagnosis not present

## 2015-10-11 DIAGNOSIS — R278 Other lack of coordination: Secondary | ICD-10-CM | POA: Diagnosis not present

## 2015-10-12 DIAGNOSIS — R296 Repeated falls: Secondary | ICD-10-CM | POA: Diagnosis not present

## 2015-10-12 DIAGNOSIS — M6281 Muscle weakness (generalized): Secondary | ICD-10-CM | POA: Diagnosis not present

## 2015-10-12 DIAGNOSIS — I83028 Varicose veins of left lower extremity with ulcer other part of lower leg: Secondary | ICD-10-CM | POA: Diagnosis not present

## 2015-10-12 DIAGNOSIS — I5031 Acute diastolic (congestive) heart failure: Secondary | ICD-10-CM | POA: Diagnosis not present

## 2015-10-12 DIAGNOSIS — R1311 Dysphagia, oral phase: Secondary | ICD-10-CM | POA: Diagnosis not present

## 2015-10-12 DIAGNOSIS — I5032 Chronic diastolic (congestive) heart failure: Secondary | ICD-10-CM | POA: Diagnosis not present

## 2015-10-12 DIAGNOSIS — R262 Difficulty in walking, not elsewhere classified: Secondary | ICD-10-CM | POA: Diagnosis not present

## 2015-10-12 DIAGNOSIS — R278 Other lack of coordination: Secondary | ICD-10-CM | POA: Diagnosis not present

## 2015-10-12 DIAGNOSIS — R1312 Dysphagia, oropharyngeal phase: Secondary | ICD-10-CM | POA: Diagnosis not present

## 2015-10-13 DIAGNOSIS — M6281 Muscle weakness (generalized): Secondary | ICD-10-CM | POA: Diagnosis not present

## 2015-10-13 DIAGNOSIS — R1311 Dysphagia, oral phase: Secondary | ICD-10-CM | POA: Diagnosis not present

## 2015-10-13 DIAGNOSIS — R278 Other lack of coordination: Secondary | ICD-10-CM | POA: Diagnosis not present

## 2015-10-13 DIAGNOSIS — R296 Repeated falls: Secondary | ICD-10-CM | POA: Diagnosis not present

## 2015-10-13 DIAGNOSIS — R1312 Dysphagia, oropharyngeal phase: Secondary | ICD-10-CM | POA: Diagnosis not present

## 2015-10-13 DIAGNOSIS — I5032 Chronic diastolic (congestive) heart failure: Secondary | ICD-10-CM | POA: Diagnosis not present

## 2015-10-13 DIAGNOSIS — I5031 Acute diastolic (congestive) heart failure: Secondary | ICD-10-CM | POA: Diagnosis not present

## 2015-10-13 DIAGNOSIS — R262 Difficulty in walking, not elsewhere classified: Secondary | ICD-10-CM | POA: Diagnosis not present

## 2015-10-14 DIAGNOSIS — I5032 Chronic diastolic (congestive) heart failure: Secondary | ICD-10-CM | POA: Diagnosis not present

## 2015-10-14 DIAGNOSIS — R296 Repeated falls: Secondary | ICD-10-CM | POA: Diagnosis not present

## 2015-10-14 DIAGNOSIS — I5031 Acute diastolic (congestive) heart failure: Secondary | ICD-10-CM | POA: Diagnosis not present

## 2015-10-14 DIAGNOSIS — R262 Difficulty in walking, not elsewhere classified: Secondary | ICD-10-CM | POA: Diagnosis not present

## 2015-10-14 DIAGNOSIS — R1311 Dysphagia, oral phase: Secondary | ICD-10-CM | POA: Diagnosis not present

## 2015-10-14 DIAGNOSIS — M6281 Muscle weakness (generalized): Secondary | ICD-10-CM | POA: Diagnosis not present

## 2015-10-14 DIAGNOSIS — R1312 Dysphagia, oropharyngeal phase: Secondary | ICD-10-CM | POA: Diagnosis not present

## 2015-10-14 DIAGNOSIS — R278 Other lack of coordination: Secondary | ICD-10-CM | POA: Diagnosis not present

## 2015-10-18 DIAGNOSIS — R296 Repeated falls: Secondary | ICD-10-CM | POA: Diagnosis not present

## 2015-10-18 DIAGNOSIS — R278 Other lack of coordination: Secondary | ICD-10-CM | POA: Diagnosis not present

## 2015-10-18 DIAGNOSIS — M6281 Muscle weakness (generalized): Secondary | ICD-10-CM | POA: Diagnosis not present

## 2015-10-18 DIAGNOSIS — R1311 Dysphagia, oral phase: Secondary | ICD-10-CM | POA: Diagnosis not present

## 2015-10-18 DIAGNOSIS — R262 Difficulty in walking, not elsewhere classified: Secondary | ICD-10-CM | POA: Diagnosis not present

## 2015-10-18 DIAGNOSIS — I5032 Chronic diastolic (congestive) heart failure: Secondary | ICD-10-CM | POA: Diagnosis not present

## 2015-10-18 DIAGNOSIS — R1312 Dysphagia, oropharyngeal phase: Secondary | ICD-10-CM | POA: Diagnosis not present

## 2015-10-18 DIAGNOSIS — I5031 Acute diastolic (congestive) heart failure: Secondary | ICD-10-CM | POA: Diagnosis not present

## 2015-10-19 DIAGNOSIS — M6281 Muscle weakness (generalized): Secondary | ICD-10-CM | POA: Diagnosis not present

## 2015-10-19 DIAGNOSIS — R1312 Dysphagia, oropharyngeal phase: Secondary | ICD-10-CM | POA: Diagnosis not present

## 2015-10-19 DIAGNOSIS — R1311 Dysphagia, oral phase: Secondary | ICD-10-CM | POA: Diagnosis not present

## 2015-10-19 DIAGNOSIS — R278 Other lack of coordination: Secondary | ICD-10-CM | POA: Diagnosis not present

## 2015-10-19 DIAGNOSIS — I5031 Acute diastolic (congestive) heart failure: Secondary | ICD-10-CM | POA: Diagnosis not present

## 2015-10-19 DIAGNOSIS — I5032 Chronic diastolic (congestive) heart failure: Secondary | ICD-10-CM | POA: Diagnosis not present

## 2015-10-19 DIAGNOSIS — R296 Repeated falls: Secondary | ICD-10-CM | POA: Diagnosis not present

## 2015-10-19 DIAGNOSIS — R262 Difficulty in walking, not elsewhere classified: Secondary | ICD-10-CM | POA: Diagnosis not present

## 2015-10-20 DIAGNOSIS — R262 Difficulty in walking, not elsewhere classified: Secondary | ICD-10-CM | POA: Diagnosis not present

## 2015-10-20 DIAGNOSIS — R1312 Dysphagia, oropharyngeal phase: Secondary | ICD-10-CM | POA: Diagnosis not present

## 2015-10-20 DIAGNOSIS — R278 Other lack of coordination: Secondary | ICD-10-CM | POA: Diagnosis not present

## 2015-10-20 DIAGNOSIS — I5031 Acute diastolic (congestive) heart failure: Secondary | ICD-10-CM | POA: Diagnosis not present

## 2015-10-20 DIAGNOSIS — R296 Repeated falls: Secondary | ICD-10-CM | POA: Diagnosis not present

## 2015-10-20 DIAGNOSIS — N390 Urinary tract infection, site not specified: Secondary | ICD-10-CM | POA: Diagnosis not present

## 2015-10-20 DIAGNOSIS — M6281 Muscle weakness (generalized): Secondary | ICD-10-CM | POA: Diagnosis not present

## 2015-10-20 DIAGNOSIS — R1311 Dysphagia, oral phase: Secondary | ICD-10-CM | POA: Diagnosis not present

## 2015-10-20 DIAGNOSIS — I5032 Chronic diastolic (congestive) heart failure: Secondary | ICD-10-CM | POA: Diagnosis not present

## 2015-10-21 DIAGNOSIS — N390 Urinary tract infection, site not specified: Secondary | ICD-10-CM | POA: Diagnosis not present

## 2015-10-24 DIAGNOSIS — G308 Other Alzheimer's disease: Secondary | ICD-10-CM | POA: Diagnosis not present

## 2015-10-24 DIAGNOSIS — I5032 Chronic diastolic (congestive) heart failure: Secondary | ICD-10-CM | POA: Diagnosis not present

## 2015-10-25 DIAGNOSIS — M6281 Muscle weakness (generalized): Secondary | ICD-10-CM | POA: Diagnosis not present

## 2015-10-25 DIAGNOSIS — I5032 Chronic diastolic (congestive) heart failure: Secondary | ICD-10-CM | POA: Diagnosis not present

## 2015-10-25 DIAGNOSIS — I5031 Acute diastolic (congestive) heart failure: Secondary | ICD-10-CM | POA: Diagnosis not present

## 2015-10-25 DIAGNOSIS — R1312 Dysphagia, oropharyngeal phase: Secondary | ICD-10-CM | POA: Diagnosis not present

## 2015-10-25 DIAGNOSIS — R278 Other lack of coordination: Secondary | ICD-10-CM | POA: Diagnosis not present

## 2015-10-25 DIAGNOSIS — R262 Difficulty in walking, not elsewhere classified: Secondary | ICD-10-CM | POA: Diagnosis not present

## 2015-10-25 DIAGNOSIS — R1311 Dysphagia, oral phase: Secondary | ICD-10-CM | POA: Diagnosis not present

## 2015-10-25 DIAGNOSIS — R296 Repeated falls: Secondary | ICD-10-CM | POA: Diagnosis not present

## 2015-10-28 DIAGNOSIS — Z79899 Other long term (current) drug therapy: Secondary | ICD-10-CM | POA: Diagnosis not present

## 2015-11-02 DIAGNOSIS — Z79899 Other long term (current) drug therapy: Secondary | ICD-10-CM | POA: Diagnosis not present

## 2015-11-03 DIAGNOSIS — I5031 Acute diastolic (congestive) heart failure: Secondary | ICD-10-CM | POA: Diagnosis not present

## 2015-11-03 DIAGNOSIS — R262 Difficulty in walking, not elsewhere classified: Secondary | ICD-10-CM | POA: Diagnosis not present

## 2015-11-03 DIAGNOSIS — M6281 Muscle weakness (generalized): Secondary | ICD-10-CM | POA: Diagnosis not present

## 2015-11-03 DIAGNOSIS — R1312 Dysphagia, oropharyngeal phase: Secondary | ICD-10-CM | POA: Diagnosis not present

## 2015-11-03 DIAGNOSIS — I5032 Chronic diastolic (congestive) heart failure: Secondary | ICD-10-CM | POA: Diagnosis not present

## 2015-11-03 DIAGNOSIS — R1311 Dysphagia, oral phase: Secondary | ICD-10-CM | POA: Diagnosis not present

## 2015-11-03 DIAGNOSIS — R278 Other lack of coordination: Secondary | ICD-10-CM | POA: Diagnosis not present

## 2015-11-03 DIAGNOSIS — R296 Repeated falls: Secondary | ICD-10-CM | POA: Diagnosis not present

## 2015-11-04 DIAGNOSIS — I5031 Acute diastolic (congestive) heart failure: Secondary | ICD-10-CM | POA: Diagnosis not present

## 2015-11-04 DIAGNOSIS — I5032 Chronic diastolic (congestive) heart failure: Secondary | ICD-10-CM | POA: Diagnosis not present

## 2015-11-04 DIAGNOSIS — R262 Difficulty in walking, not elsewhere classified: Secondary | ICD-10-CM | POA: Diagnosis not present

## 2015-11-04 DIAGNOSIS — R1311 Dysphagia, oral phase: Secondary | ICD-10-CM | POA: Diagnosis not present

## 2015-11-04 DIAGNOSIS — R1312 Dysphagia, oropharyngeal phase: Secondary | ICD-10-CM | POA: Diagnosis not present

## 2015-11-04 DIAGNOSIS — M545 Low back pain: Secondary | ICD-10-CM | POA: Diagnosis not present

## 2015-11-04 DIAGNOSIS — R278 Other lack of coordination: Secondary | ICD-10-CM | POA: Diagnosis not present

## 2015-11-04 DIAGNOSIS — R296 Repeated falls: Secondary | ICD-10-CM | POA: Diagnosis not present

## 2015-11-04 DIAGNOSIS — M546 Pain in thoracic spine: Secondary | ICD-10-CM | POA: Diagnosis not present

## 2015-11-04 DIAGNOSIS — M6281 Muscle weakness (generalized): Secondary | ICD-10-CM | POA: Diagnosis not present

## 2015-11-05 DIAGNOSIS — I5032 Chronic diastolic (congestive) heart failure: Secondary | ICD-10-CM | POA: Diagnosis not present

## 2015-11-05 DIAGNOSIS — M6281 Muscle weakness (generalized): Secondary | ICD-10-CM | POA: Diagnosis not present

## 2015-11-05 DIAGNOSIS — R278 Other lack of coordination: Secondary | ICD-10-CM | POA: Diagnosis not present

## 2015-11-05 DIAGNOSIS — R1312 Dysphagia, oropharyngeal phase: Secondary | ICD-10-CM | POA: Diagnosis not present

## 2015-11-05 DIAGNOSIS — R262 Difficulty in walking, not elsewhere classified: Secondary | ICD-10-CM | POA: Diagnosis not present

## 2015-11-05 DIAGNOSIS — I5031 Acute diastolic (congestive) heart failure: Secondary | ICD-10-CM | POA: Diagnosis not present

## 2015-11-05 DIAGNOSIS — R1311 Dysphagia, oral phase: Secondary | ICD-10-CM | POA: Diagnosis not present

## 2015-11-05 DIAGNOSIS — R296 Repeated falls: Secondary | ICD-10-CM | POA: Diagnosis not present

## 2015-11-12 DEATH — deceased

## 2015-12-02 ENCOUNTER — Ambulatory Visit (HOSPITAL_COMMUNITY)
Admission: RE | Admit: 2015-12-02 | Discharge: 2015-12-02 | Disposition: A | Discharge status: Home / Self Care | Payer: Medicare Other | Source: Ambulatory Visit | Attending: Family | Admitting: Family

## 2015-12-02 ENCOUNTER — Ambulatory Visit: Payer: Medicare Other | Admitting: Family

## 2015-12-02 DIAGNOSIS — I6522 Occlusion and stenosis of left carotid artery: Secondary | ICD-10-CM

## 2015-12-02 DIAGNOSIS — Z48812 Encounter for surgical aftercare following surgery on the circulatory system: Secondary | ICD-10-CM

## 2015-12-02 DIAGNOSIS — Z9889 Other specified postprocedural states: Secondary | ICD-10-CM

## 2016-06-22 ENCOUNTER — Encounter: Payer: Self-pay | Admitting: Cardiology

## 2016-09-09 IMAGING — CR DG CHEST 2V
2 series · 2 of 2 positions shown · non-contrast
Comparison: 08/04/2015

CLINICAL DATA: Shortness of breath today

EXAM:
CHEST  2 VIEW

[chest lat]
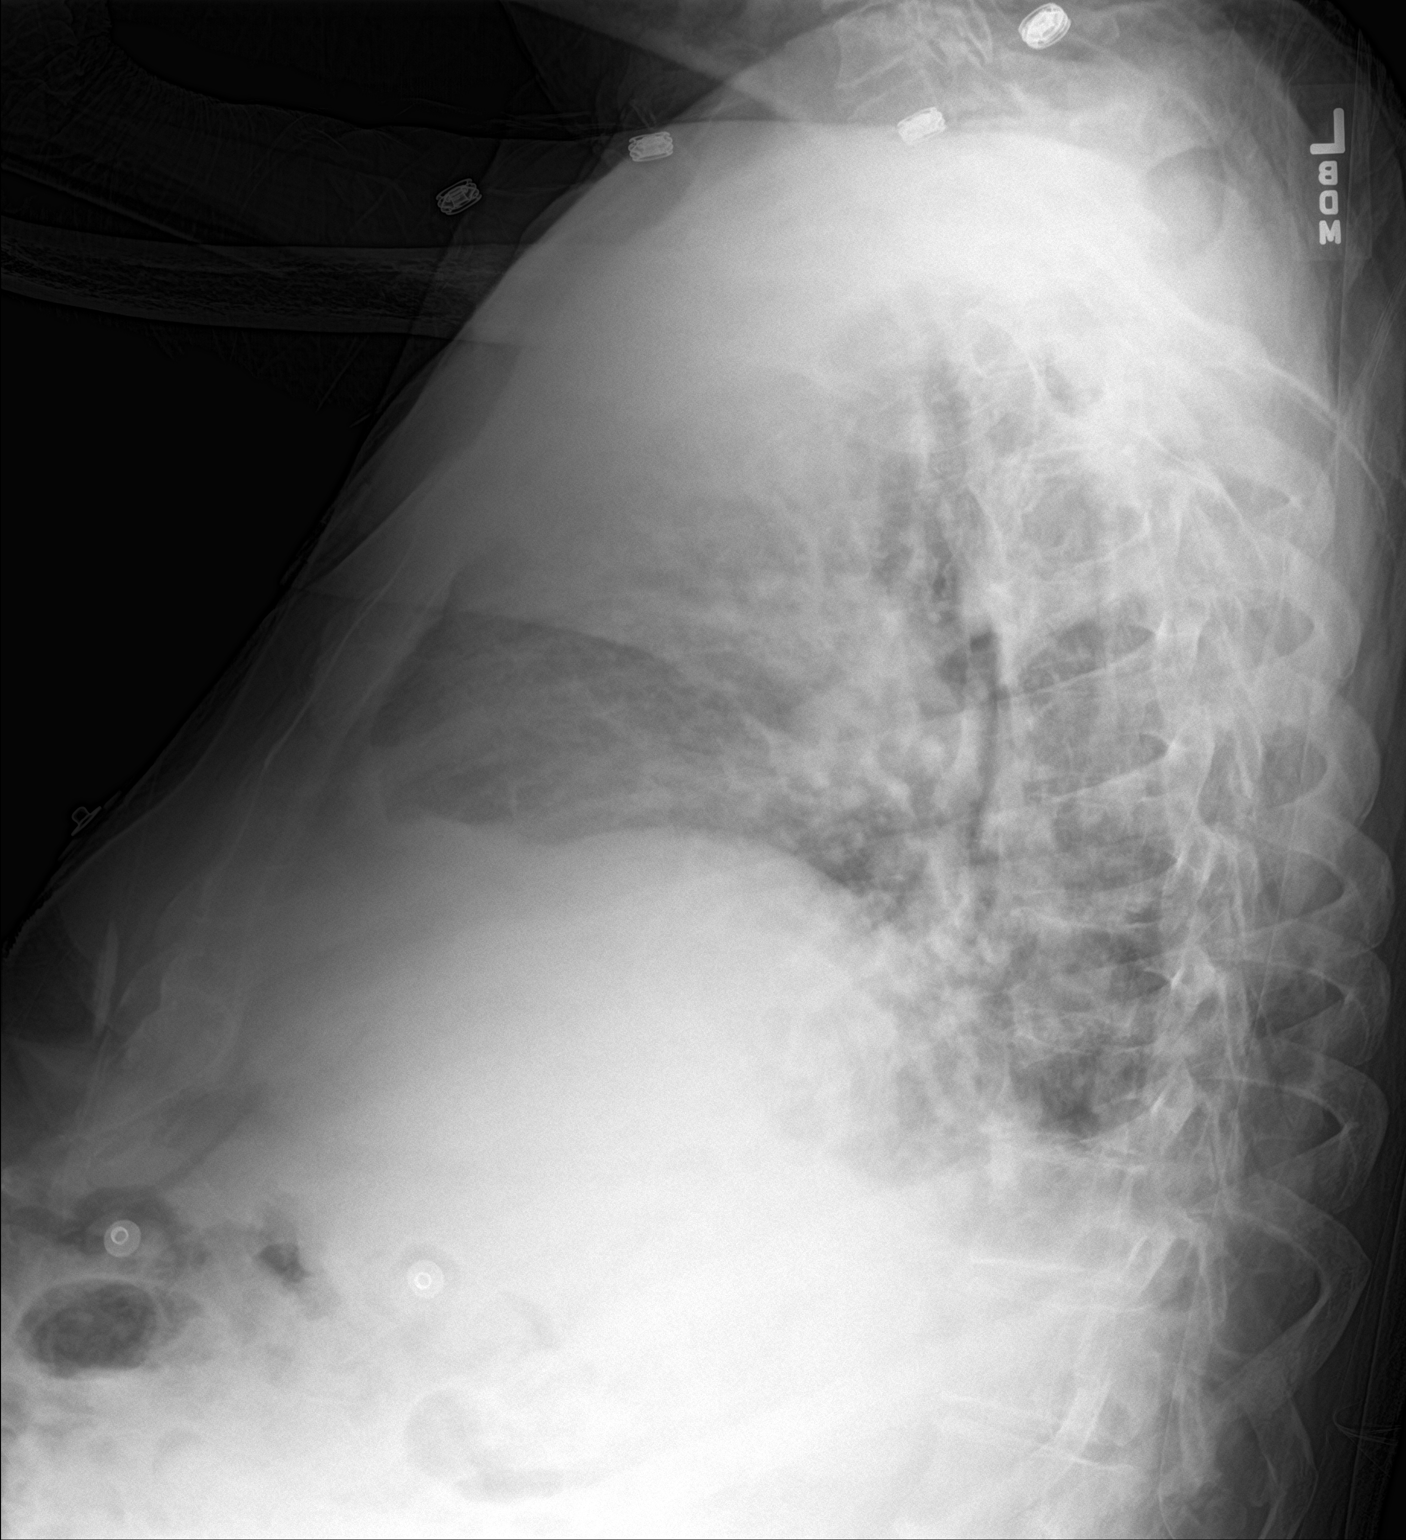

[chest ap]
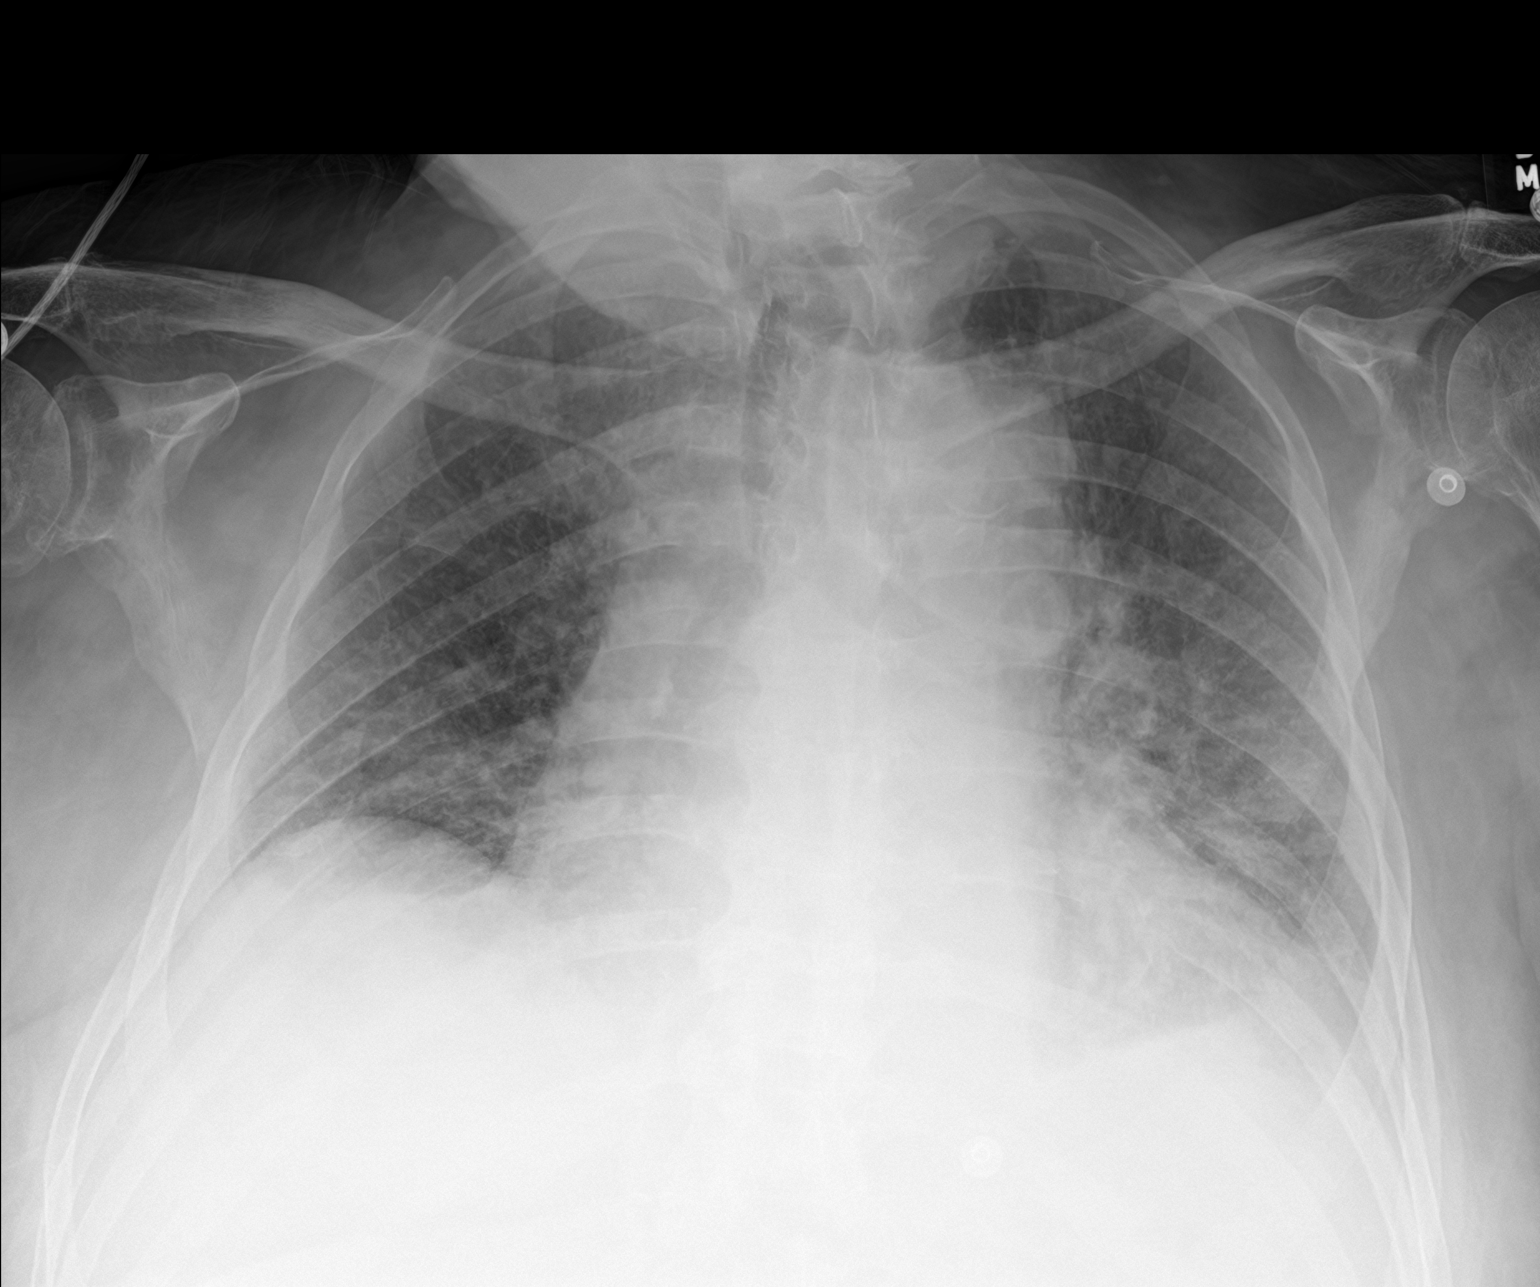

[2 of 2 positions shown; findings below may reference images not displayed]

FINDINGS: Cardiomegaly is noted. Cardiomegaly is noted. Central vascular
congestion without convincing pulmonary edema. Small left pleural
effusion. Mild degenerative changes thoracic spine. Streaky left
basilar atelectasis or infiltrate.
IMPRESSION: Cardiomegaly. Central mild vascular congestion without convincing
pulmonary edema. Small left pleural effusion. Streaky left basilar
atelectasis or infiltrate.

## 2016-09-10 IMAGING — CT CT CHEST W/O CM
2 of 3 series · 14 of 36 positions shown, 17 images · non-contrast
Comparison: Chest radiograph 08/06/2015.  Chest CT 12/16/2012

CLINICAL DATA: Nonproductive cough. Admitted for possible CHF
exacerbation. Dyspnea.

EXAM:
CT CHEST WITHOUT CONTRAST
TECHNIQUE: Multidetector CT imaging of the chest was performed following the
standard protocol without IV contrast.

[Series 3: chest w/o 2mm st · axial · non-contrast · 0.77mm/px · z∈[+1144,+1390]mm · 11 of 145 slices shown, 14 images]
[im 11/145  mediastinal]
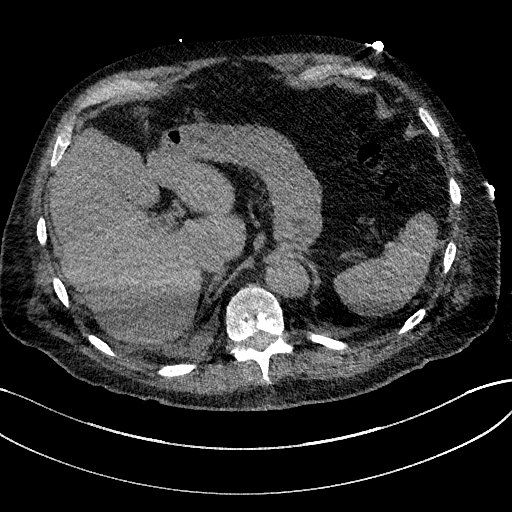
[im 11/145  lung]
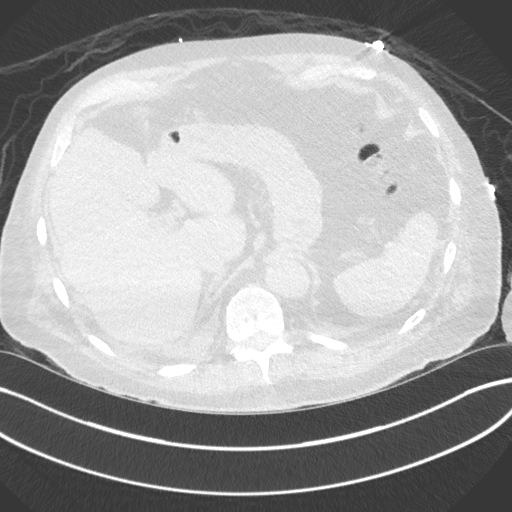
[im 22/145  lung]
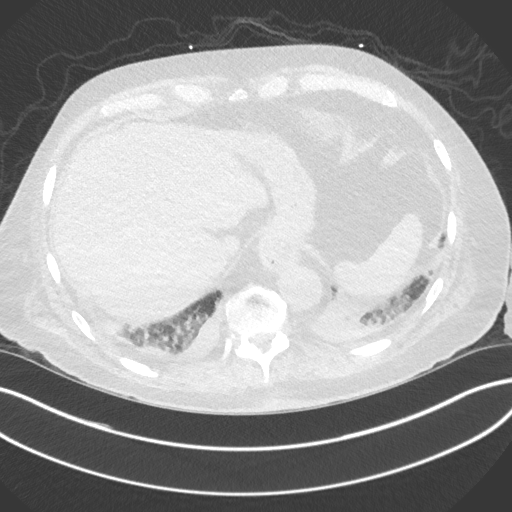
[im 33/145  lung]
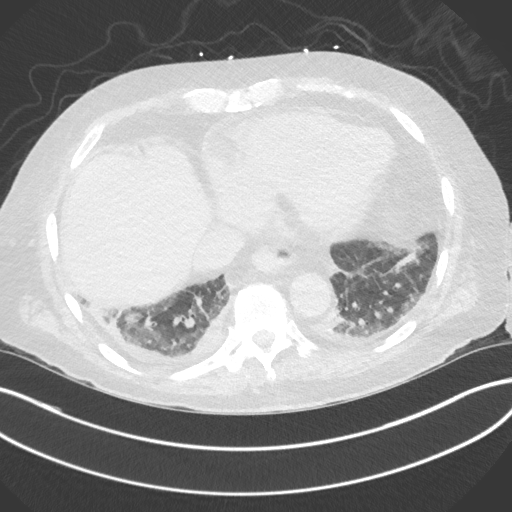
[im 49/145  lung]
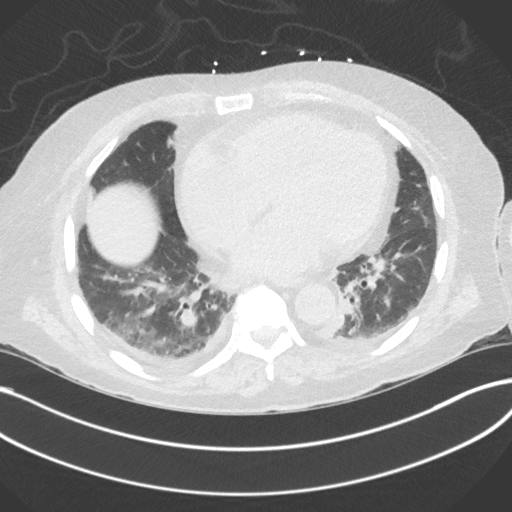
[im 59/145  mediastinal]
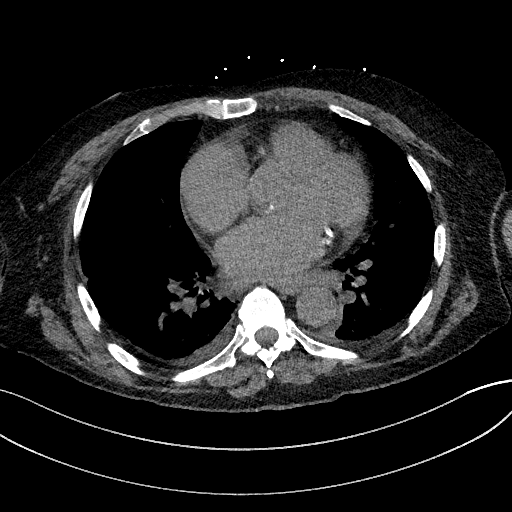
[im 59/145  lung]
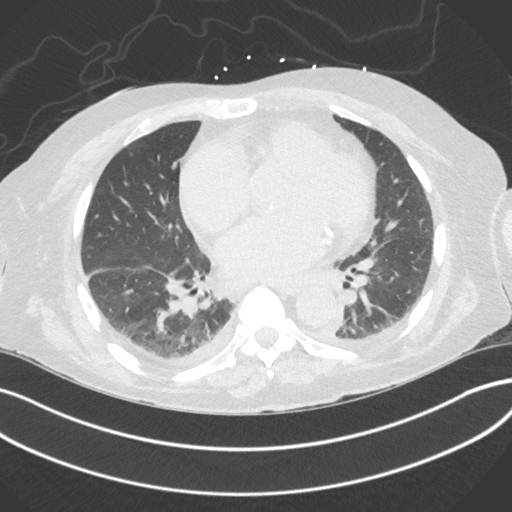
[im 75/145  lung]
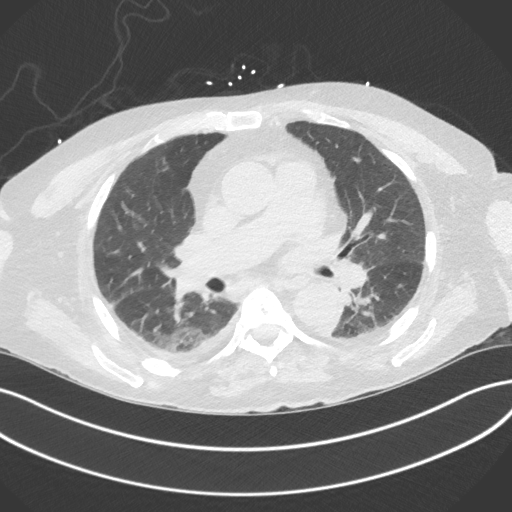
[im 86/145  lung]
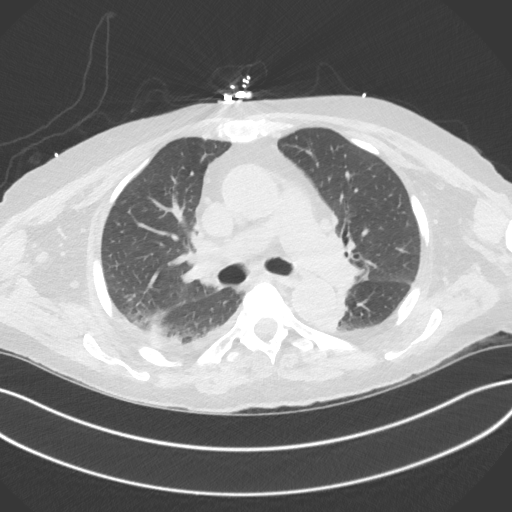
[im 97/145  lung]
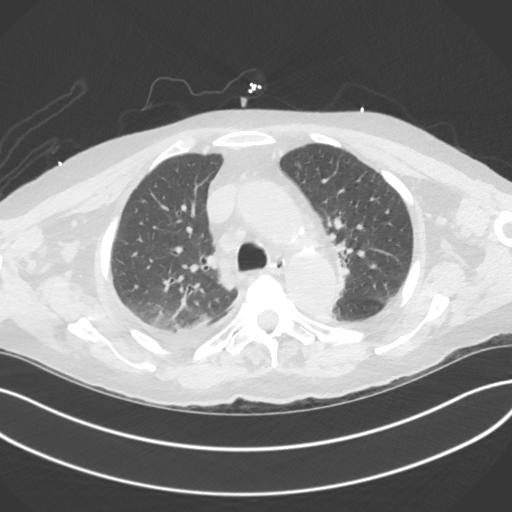
[im 113/145  mediastinal]
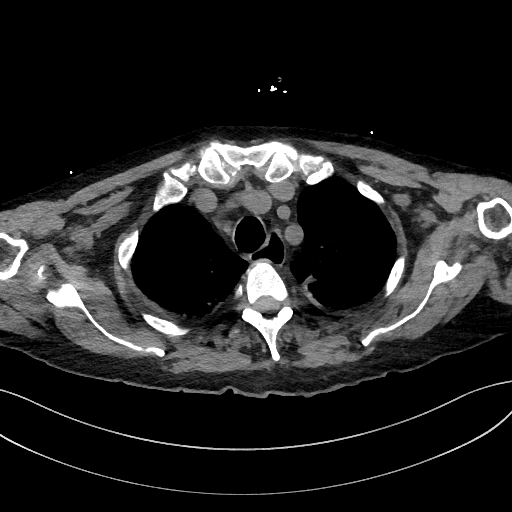
[im 113/145  lung]
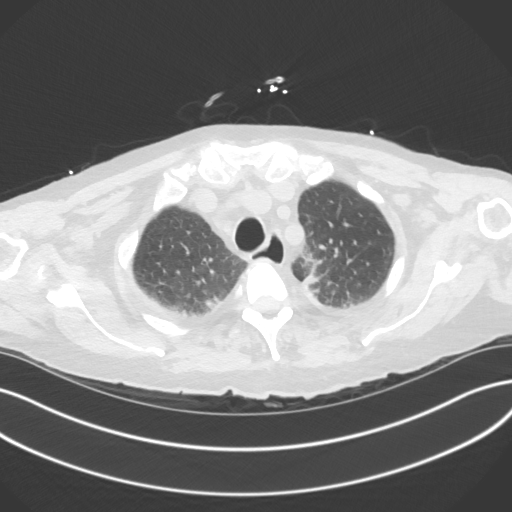
[im 123/145  lung]
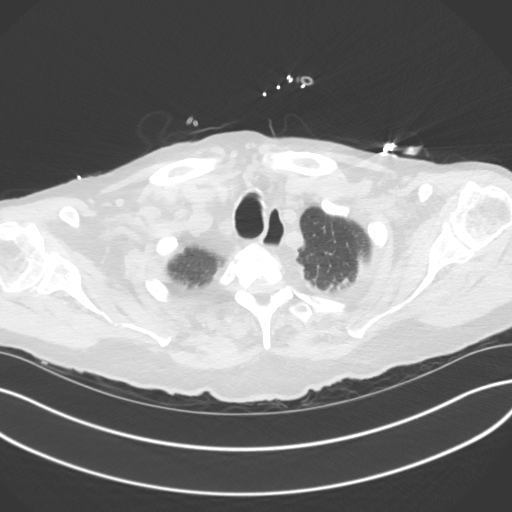
[im 134/145  lung]
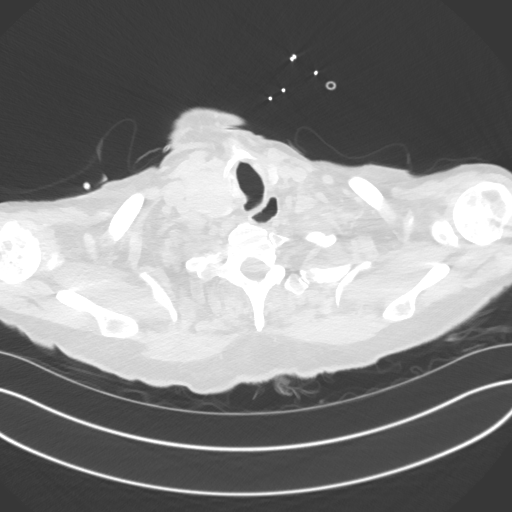

[Series 6: chest w/o 3mm st cor · coronal · non-contrast · 0.56mm/px · 3 of 101 slices shown]
[im 21/101  lung]
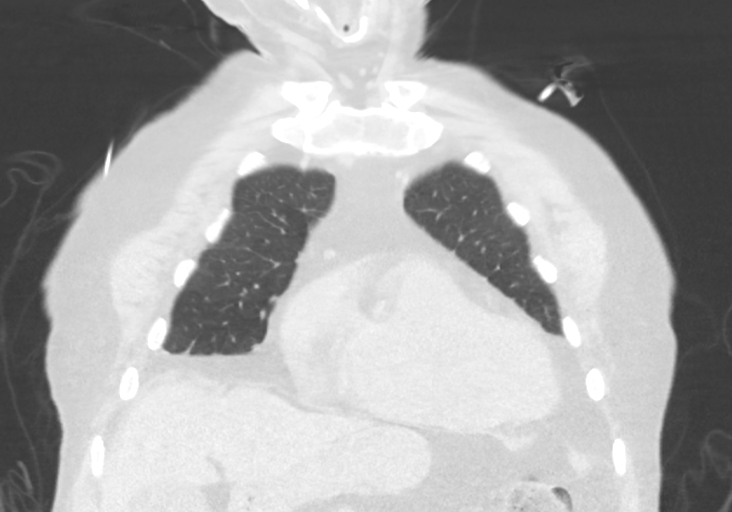
[im 41/101  lung]
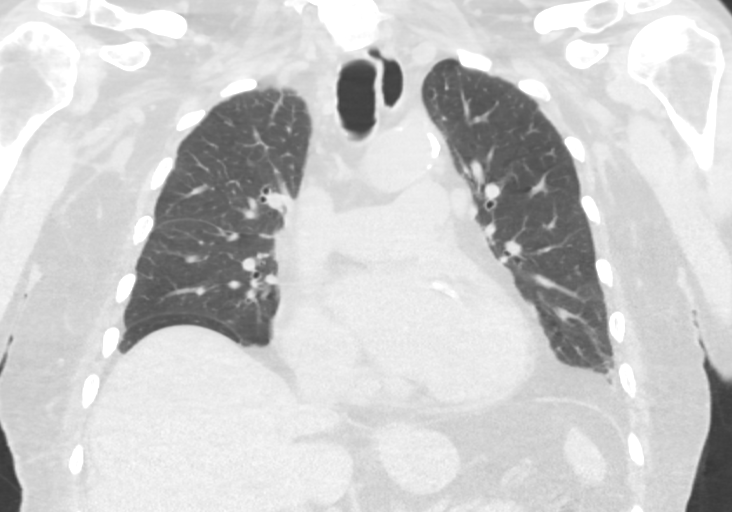
[im 61/101  lung]
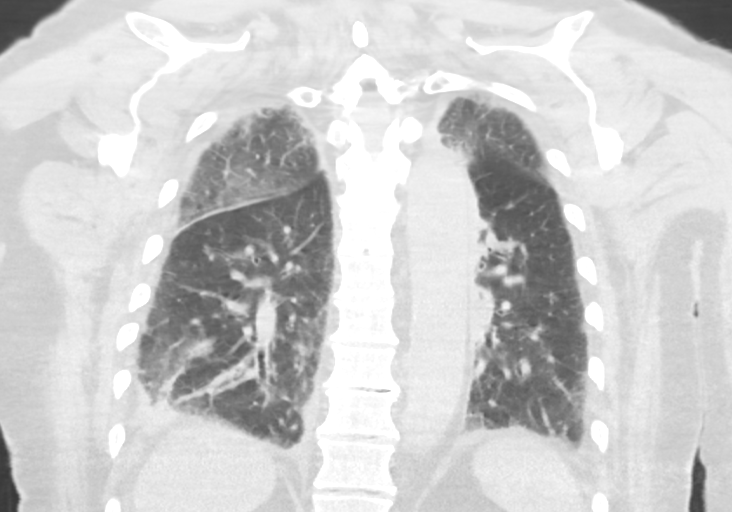

[14 of 36 positions shown; findings below may reference images not displayed]

FINDINGS: Mediastinum/Lymph Nodes: Enlargement of the right thyroid gland with
a heterogeneous low-density nodule measuring up to 3.5 cm. This
nodule roughly measured 3 cm in 5199. Slightly prominent mediastinal
lymph nodes are similar to the prior examination. Subcarinal tissue
measures up to 0.9 cm in the short axis and previously measured
cm. Limited evaluation for hilar lymphadenopathy on this noncontrast
study. Heart size is upper limits of normal without significant
pericardial fluid. There are diffuse coronary artery calcifications.
Mid ascending thoracic aorta measures up to 4 cm and unchanged since
5199. Proximal descending thoracic aorta measures 3.7 cm and stable.
Small-moderate size hiatal hernia.

Lungs/Pleura: Trace pleural effusions versus pleural thickening. The
trachea is deviated towards the left due to the large right thyroid
nodule. Trachea and mainstem bronchi are patent. Patchy ground-glass
densities in the lower lobes, right side greater than left. There is
volume loss in both lower lobes. Dependent ground-glass densities in
the right upper lobe.

Upper abdomen: Images of the upper abdomen are unremarkable.

Musculoskeletal: No suspicious bone findings.
IMPRESSION: Mild volume loss with dependent ground-glass densities in both
lungs, particularly in the lower lobes. Findings could represent a
combination of atelectasis and mild dependent pulmonary edema. There
may be trace pleural fluid versus areas of pleural thickening.

Enlarging right thyroid nodule which is indeterminate. Nodule
measures up to 3.5 cm.

Fusiform aneurysm of the ascending thoracic aorta measuring up to
4.0 cm. Recommend annual imaging followup by CTA or MRA. This
recommendation follows 5747
ACCF/AHA/AATS/ACR/ASA/SCA/SIPES/SHI/YASMINA/INDRIVE Guidelines for the
Diagnosis and Management of Patients with Thoracic Aortic Disease.
Circulation. 5747; 121: e266-e369

Coronary artery calcifications.

## 2016-09-10 IMAGING — CR DG CHEST 2V
2 series · 2 of 2 positions shown · non-contrast
Comparison: Yesterday

CLINICAL DATA: Acute diastolic heart failure.

EXAM:
CHEST  2 VIEW

[chest lat]
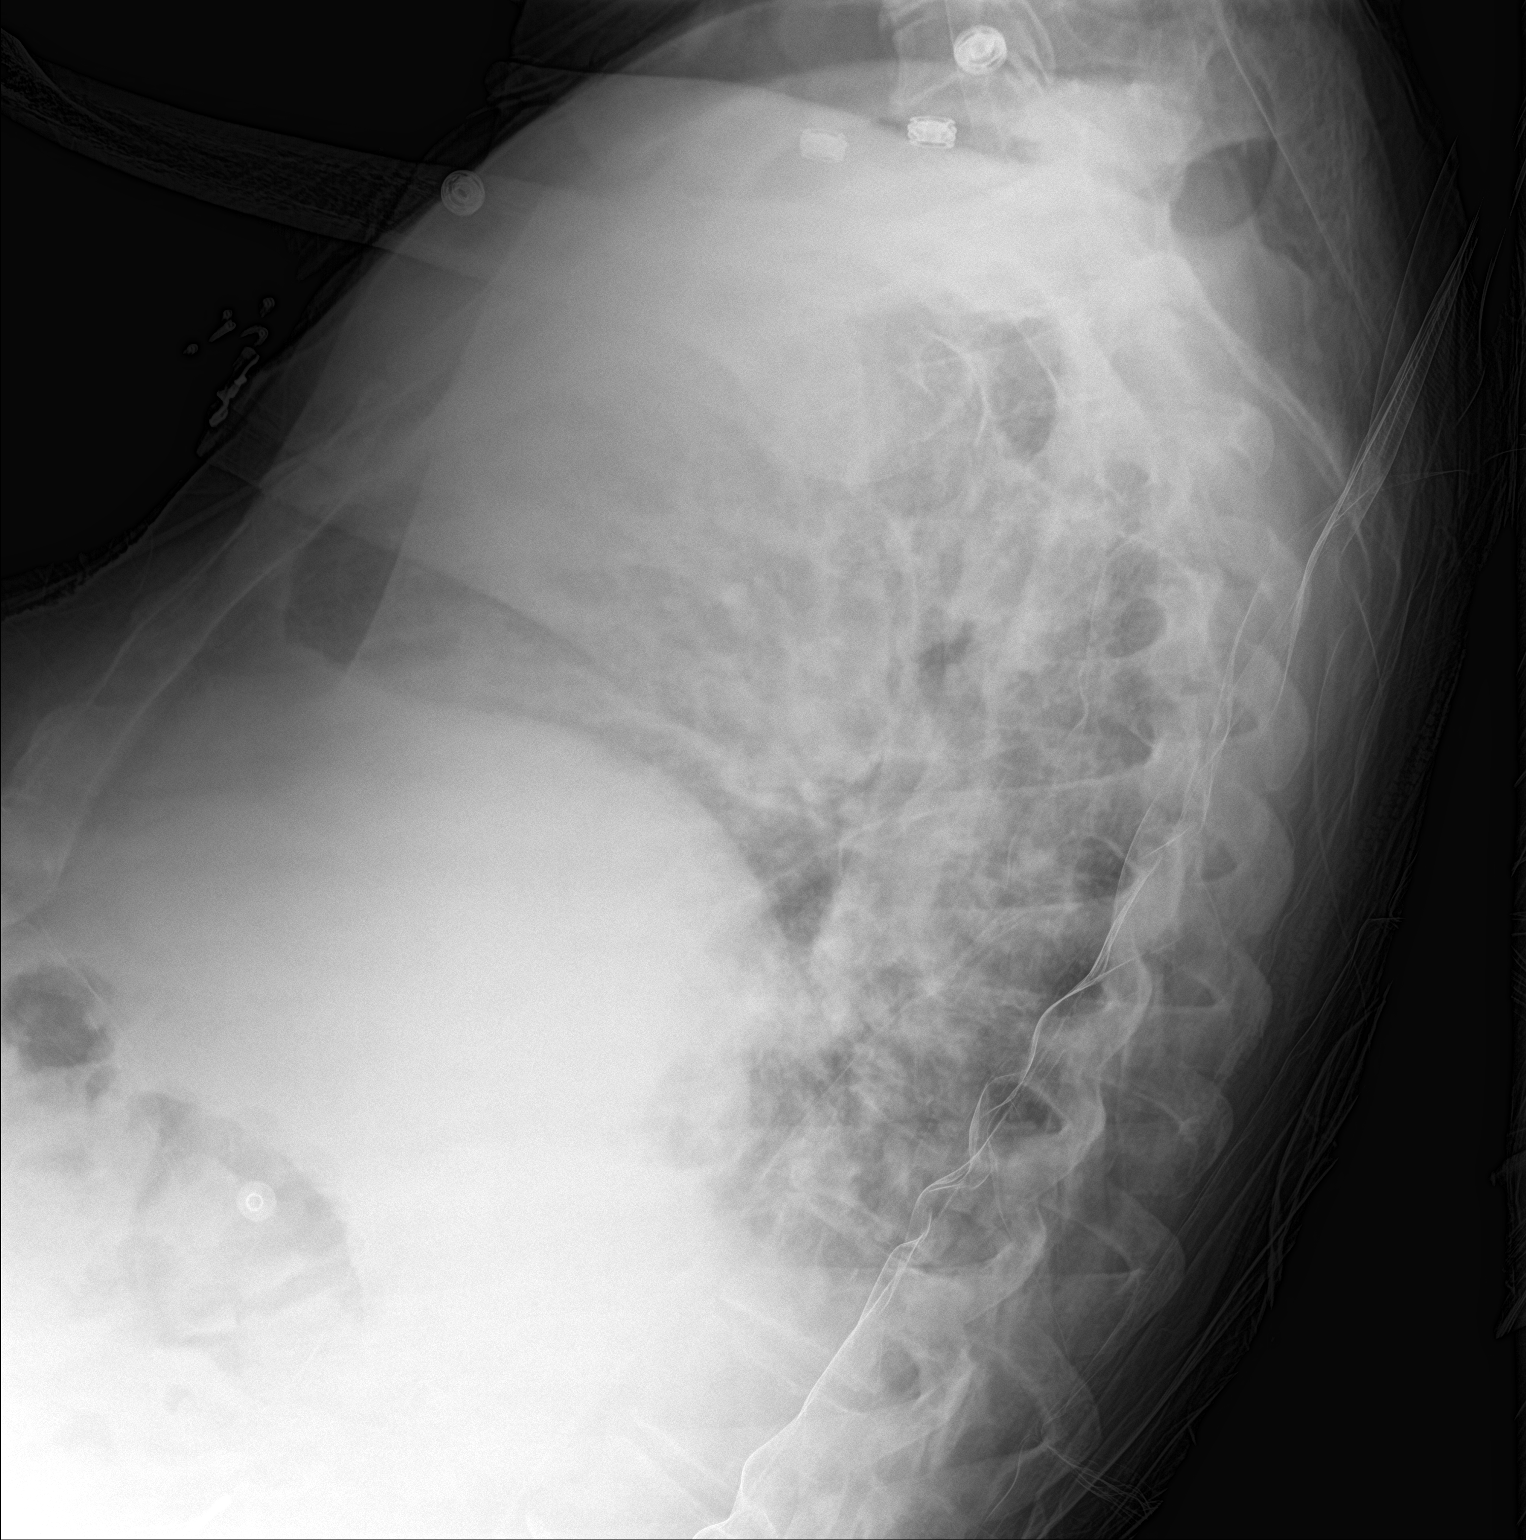

[chest ap]
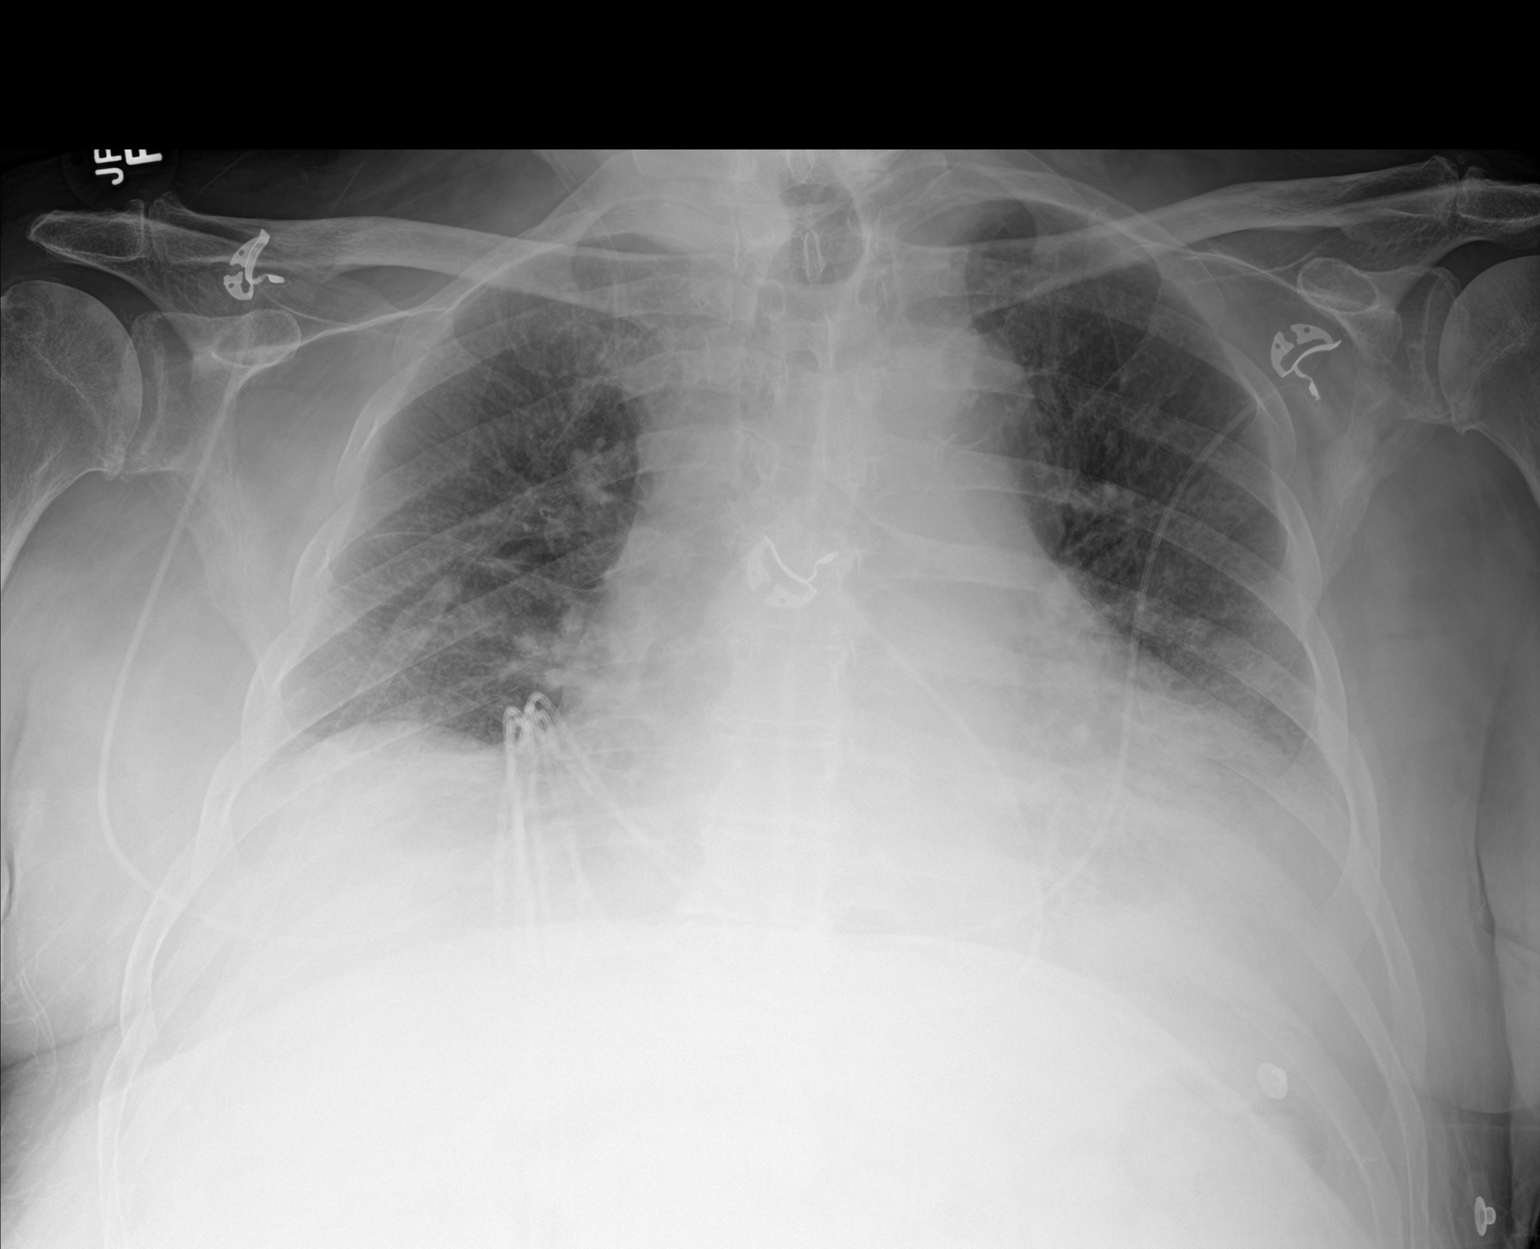

[2 of 2 positions shown; findings below may reference images not displayed]

FINDINGS: Stable cardiopericardial enlargement. Low volume chest with
bibasilar opacity that is atelectasis based on subsequent chest CT.
Opacity at the heart is predominately related to fat based on CT. No
convincing edema or effusion. Pulmonary venous congestion.
IMPRESSION: 1. Low volume chest with bibasilar atelectasis.
2. Cardiomegaly and pulmonary venous congestion
